# Patient Record
Sex: Female | Born: 1960
Health system: Southern US, Community
[De-identification: ages and names within clinical notes are randomized; demographics above are authoritative.]

## PROBLEM LIST (undated history)

## (undated) DIAGNOSIS — N183 Chronic kidney disease, stage 3 unspecified: Secondary | ICD-10-CM

## (undated) DIAGNOSIS — E119 Type 2 diabetes mellitus without complications: Secondary | ICD-10-CM

## (undated) DIAGNOSIS — J449 Chronic obstructive pulmonary disease, unspecified: Secondary | ICD-10-CM

## (undated) DIAGNOSIS — E785 Hyperlipidemia, unspecified: Secondary | ICD-10-CM

## (undated) DIAGNOSIS — R0600 Dyspnea, unspecified: Secondary | ICD-10-CM

## (undated) DIAGNOSIS — F32A Depression, unspecified: Secondary | ICD-10-CM

## (undated) DIAGNOSIS — R32 Unspecified urinary incontinence: Secondary | ICD-10-CM

## (undated) DIAGNOSIS — I10 Essential (primary) hypertension: Secondary | ICD-10-CM

## (undated) DIAGNOSIS — G4733 Obstructive sleep apnea (adult) (pediatric): Secondary | ICD-10-CM

## (undated) DIAGNOSIS — R296 Repeated falls: Secondary | ICD-10-CM

## (undated) DIAGNOSIS — Z91199 Patient's noncompliance with other medical treatment and regimen due to unspecified reason: Secondary | ICD-10-CM

## (undated) DIAGNOSIS — Z9119 Patient's noncompliance with other medical treatment and regimen: Secondary | ICD-10-CM

## (undated) DIAGNOSIS — M1990 Unspecified osteoarthritis, unspecified site: Secondary | ICD-10-CM

## (undated) DIAGNOSIS — I2510 Atherosclerotic heart disease of native coronary artery without angina pectoris: Secondary | ICD-10-CM

## (undated) DIAGNOSIS — R519 Headache, unspecified: Secondary | ICD-10-CM

## (undated) DIAGNOSIS — K219 Gastro-esophageal reflux disease without esophagitis: Secondary | ICD-10-CM

## (undated) DIAGNOSIS — R51 Headache: Secondary | ICD-10-CM

## (undated) HISTORY — DX: Obstructive sleep apnea (adult) (pediatric): G47.33

## (undated) HISTORY — DX: Type 2 diabetes mellitus without complications: E11.9

## (undated) HISTORY — DX: Patient's noncompliance with other medical treatment and regimen due to unspecified reason: Z91.199

## (undated) HISTORY — PX: TUBAL LIGATION: SHX77

## (undated) HISTORY — PX: BACK SURGERY: SHX140

## (undated) HISTORY — PX: CHOLECYSTECTOMY: SHX55

## (undated) HISTORY — PX: OTHER SURGICAL HISTORY: SHX169

## (undated) HISTORY — DX: Atherosclerotic heart disease of native coronary artery without angina pectoris: I25.10

## (undated) HISTORY — PX: BREAST SURGERY: SHX581

## (undated) HISTORY — DX: Chronic kidney disease, stage 3 (moderate): N18.3

## (undated) HISTORY — DX: Hyperlipidemia, unspecified: E78.5

## (undated) HISTORY — DX: Gastro-esophageal reflux disease without esophagitis: K21.9

## (undated) HISTORY — DX: Chronic kidney disease, stage 3 unspecified: N18.30

## (undated) HISTORY — DX: Morbid (severe) obesity due to excess calories: E66.01

## (undated) HISTORY — DX: Headache, unspecified: R51.9

## (undated) HISTORY — PX: HERNIA REPAIR: SHX51

## (undated) HISTORY — PX: ABDOMINAL HYSTERECTOMY: SHX81

## (undated) HISTORY — DX: Patient's noncompliance with other medical treatment and regimen: Z91.19

## (undated) HISTORY — DX: Headache: R51

## (undated) HISTORY — PX: ABDOMINAL HERNIA REPAIR: SHX539

## (undated) HISTORY — DX: Essential (primary) hypertension: I10

---

## 2002-03-29 ENCOUNTER — Ambulatory Visit (HOSPITAL_COMMUNITY): Admission: RE | Admit: 2002-03-29 | Discharge: 2002-03-29 | Payer: Self-pay | Admitting: *Deleted

## 2002-03-29 ENCOUNTER — Encounter: Payer: Self-pay | Admitting: *Deleted

## 2005-02-01 ENCOUNTER — Ambulatory Visit: Payer: Self-pay | Admitting: Cardiology

## 2005-02-02 ENCOUNTER — Ambulatory Visit: Payer: Self-pay | Admitting: Cardiology

## 2005-02-02 ENCOUNTER — Inpatient Hospital Stay (HOSPITAL_COMMUNITY): Admission: EM | Admit: 2005-02-02 | Discharge: 2005-02-05 | Payer: Self-pay | Admitting: Internal Medicine

## 2005-03-30 ENCOUNTER — Ambulatory Visit: Payer: Self-pay | Admitting: Cardiology

## 2005-03-30 ENCOUNTER — Inpatient Hospital Stay (HOSPITAL_COMMUNITY): Admission: EM | Admit: 2005-03-30 | Discharge: 2005-04-01 | Payer: Self-pay | Admitting: Emergency Medicine

## 2005-03-31 ENCOUNTER — Ambulatory Visit: Payer: Self-pay | Admitting: Cardiology

## 2005-05-26 ENCOUNTER — Ambulatory Visit: Payer: Self-pay | Admitting: Cardiology

## 2005-06-09 ENCOUNTER — Ambulatory Visit: Payer: Self-pay | Admitting: Cardiology

## 2005-07-15 ENCOUNTER — Ambulatory Visit: Payer: Self-pay | Admitting: Cardiology

## 2006-01-31 ENCOUNTER — Ambulatory Visit: Payer: Self-pay | Admitting: Cardiology

## 2006-02-04 ENCOUNTER — Ambulatory Visit: Payer: Self-pay | Admitting: Cardiology

## 2006-02-08 ENCOUNTER — Inpatient Hospital Stay (HOSPITAL_BASED_OUTPATIENT_CLINIC_OR_DEPARTMENT_OTHER): Admission: RE | Admit: 2006-02-08 | Discharge: 2006-02-08 | Payer: Self-pay | Admitting: Cardiology

## 2006-02-08 ENCOUNTER — Ambulatory Visit (HOSPITAL_COMMUNITY): Admission: RE | Admit: 2006-02-08 | Discharge: 2006-02-09 | Payer: Self-pay | Admitting: Cardiology

## 2006-02-08 ENCOUNTER — Ambulatory Visit: Payer: Self-pay | Admitting: Cardiology

## 2006-02-22 ENCOUNTER — Ambulatory Visit: Payer: Self-pay | Admitting: Cardiology

## 2006-05-19 ENCOUNTER — Ambulatory Visit: Payer: Self-pay | Admitting: Cardiology

## 2006-05-26 ENCOUNTER — Ambulatory Visit: Payer: Self-pay | Admitting: Cardiology

## 2006-06-06 ENCOUNTER — Ambulatory Visit: Payer: Self-pay | Admitting: Cardiology

## 2006-06-14 ENCOUNTER — Ambulatory Visit: Payer: Self-pay | Admitting: Cardiology

## 2006-06-28 ENCOUNTER — Ambulatory Visit: Payer: Self-pay | Admitting: Cardiology

## 2006-07-12 ENCOUNTER — Ambulatory Visit: Payer: Self-pay | Admitting: Cardiology

## 2006-07-21 ENCOUNTER — Ambulatory Visit: Payer: Self-pay | Admitting: Cardiology

## 2006-07-26 ENCOUNTER — Ambulatory Visit: Payer: Self-pay | Admitting: Cardiology

## 2006-08-08 ENCOUNTER — Ambulatory Visit: Payer: Self-pay | Admitting: Cardiology

## 2006-08-17 ENCOUNTER — Ambulatory Visit: Payer: Self-pay | Admitting: Cardiology

## 2006-10-04 ENCOUNTER — Ambulatory Visit: Payer: Self-pay | Admitting: Cardiology

## 2007-03-17 ENCOUNTER — Ambulatory Visit: Payer: Self-pay | Admitting: Cardiology

## 2007-05-18 ENCOUNTER — Ambulatory Visit: Payer: Self-pay | Admitting: Cardiology

## 2007-07-24 ENCOUNTER — Ambulatory Visit: Payer: Self-pay | Admitting: Cardiovascular Disease

## 2007-07-24 ENCOUNTER — Inpatient Hospital Stay (HOSPITAL_COMMUNITY): Admission: EM | Admit: 2007-07-24 | Discharge: 2007-07-26 | Payer: Self-pay | Admitting: Emergency Medicine

## 2007-07-24 ENCOUNTER — Encounter: Payer: Self-pay | Admitting: Cardiology

## 2007-08-04 ENCOUNTER — Emergency Department (HOSPITAL_COMMUNITY): Admission: EM | Admit: 2007-08-04 | Discharge: 2007-08-04 | Payer: Self-pay | Admitting: Emergency Medicine

## 2007-08-18 ENCOUNTER — Ambulatory Visit: Payer: Self-pay | Admitting: Cardiology

## 2007-09-11 ENCOUNTER — Ambulatory Visit: Payer: Self-pay | Admitting: Cardiology

## 2008-01-22 ENCOUNTER — Ambulatory Visit: Payer: Self-pay | Admitting: Cardiology

## 2008-02-13 ENCOUNTER — Encounter: Payer: Self-pay | Admitting: Cardiology

## 2008-08-05 ENCOUNTER — Ambulatory Visit: Payer: Self-pay | Admitting: Cardiology

## 2009-01-11 ENCOUNTER — Encounter: Payer: Self-pay | Admitting: Cardiology

## 2009-01-11 ENCOUNTER — Ambulatory Visit: Payer: Self-pay | Admitting: Cardiology

## 2009-01-12 ENCOUNTER — Encounter: Payer: Self-pay | Admitting: Cardiology

## 2009-01-15 ENCOUNTER — Inpatient Hospital Stay (HOSPITAL_COMMUNITY): Admission: AD | Admit: 2009-01-15 | Discharge: 2009-01-16 | Payer: Self-pay | Admitting: Cardiology

## 2009-01-15 ENCOUNTER — Ambulatory Visit: Payer: Self-pay | Admitting: Cardiovascular Disease

## 2009-01-15 ENCOUNTER — Encounter: Payer: Self-pay | Admitting: Cardiology

## 2009-02-04 ENCOUNTER — Ambulatory Visit: Payer: Self-pay | Admitting: Cardiology

## 2009-08-16 ENCOUNTER — Inpatient Hospital Stay (HOSPITAL_COMMUNITY): Admission: EM | Admit: 2009-08-16 | Discharge: 2009-08-18 | Payer: Self-pay | Admitting: Emergency Medicine

## 2009-08-16 ENCOUNTER — Ambulatory Visit: Payer: Self-pay | Admitting: Cardiology

## 2009-08-20 ENCOUNTER — Encounter: Payer: Self-pay | Admitting: Cardiology

## 2009-09-15 ENCOUNTER — Encounter: Payer: Self-pay | Admitting: Cardiology

## 2009-11-14 ENCOUNTER — Ambulatory Visit: Payer: Self-pay | Admitting: Cardiology

## 2010-02-04 ENCOUNTER — Emergency Department (HOSPITAL_COMMUNITY): Admission: EM | Admit: 2010-02-04 | Discharge: 2010-02-04 | Payer: Self-pay | Admitting: Emergency Medicine

## 2010-03-01 ENCOUNTER — Emergency Department (HOSPITAL_COMMUNITY): Admission: EM | Admit: 2010-03-01 | Discharge: 2010-03-01 | Payer: Self-pay | Admitting: Emergency Medicine

## 2010-05-25 ENCOUNTER — Ambulatory Visit: Payer: Self-pay | Admitting: Cardiology

## 2010-11-08 HISTORY — PX: CORONARY ANGIOPLASTY WITH STENT PLACEMENT: SHX49

## 2010-12-05 ENCOUNTER — Observation Stay (HOSPITAL_COMMUNITY)
Admission: EM | Admit: 2010-12-05 | Discharge: 2010-12-07 | Payer: Self-pay | Source: Home / Self Care | Attending: Internal Medicine | Admitting: Internal Medicine

## 2010-12-05 LAB — DIFFERENTIAL
Basophils Absolute: 0.1 10*3/uL (ref 0.0–0.1)
Basophils Relative: 1 % (ref 0–1)
Eosinophils Absolute: 0.6 10*3/uL (ref 0.0–0.7)
Eosinophils Relative: 8 % — ABNORMAL HIGH (ref 0–5)
Lymphocytes Relative: 36 % (ref 12–46)
Lymphs Abs: 2.8 10*3/uL (ref 0.7–4.0)
Monocytes Absolute: 0.5 10*3/uL (ref 0.1–1.0)
Monocytes Relative: 6 % (ref 3–12)
Neutro Abs: 3.9 10*3/uL (ref 1.7–7.7)
Neutrophils Relative %: 49 % (ref 43–77)

## 2010-12-05 LAB — COMPREHENSIVE METABOLIC PANEL
ALT: 19 U/L (ref 0–35)
AST: 19 U/L (ref 0–37)
Albumin: 3.7 g/dL (ref 3.5–5.2)
Alkaline Phosphatase: 59 U/L (ref 39–117)
BUN: 6 mg/dL (ref 6–23)
CO2: 24 mEq/L (ref 19–32)
Calcium: 9 mg/dL (ref 8.4–10.5)
Chloride: 107 mEq/L (ref 96–112)
Creatinine, Ser: 0.85 mg/dL (ref 0.4–1.2)
GFR calc Af Amer: >60 mL/min (ref >60)
GFR calc non Af Amer: >60 mL/min (ref >60)
Glucose, Bld: 102 mg/dL — ABNORMAL HIGH (ref 70–99)
Potassium: 3.7 mEq/L (ref 3.5–5.1)
Sodium: 142 mEq/L (ref 135–145)
Total Bilirubin: 0.3 mg/dL (ref 0.3–1.2)
Total Protein: 6 g/dL (ref 6.0–8.3)

## 2010-12-05 LAB — POCT CARDIAC MARKERS
CKMB, poc: <1 ng/mL — ABNORMAL LOW (ref 1.0–8.0)
Myoglobin, poc: 53.3 ng/mL (ref 12–200)
Troponin i, poc: <0.05 ng/mL (ref 0.00–0.09)

## 2010-12-05 LAB — CBC
HCT: 37 % (ref 36.0–46.0)
Hemoglobin: 12.2 g/dL (ref 12.0–15.0)
MCH: 32 pg (ref 26.0–34.0)
MCHC: 33 g/dL (ref 30.0–36.0)
MCV: 97.1 fL (ref 78.0–100.0)
Platelets: 123 10*3/uL — ABNORMAL LOW (ref 150–400)
RBC: 3.81 MIL/uL — ABNORMAL LOW (ref 3.87–5.11)
RDW: 14.3 % (ref 11.5–15.5)
WBC: 7.9 10*3/uL (ref 4.0–10.5)

## 2010-12-06 LAB — CBC
HCT: 34.2 % — ABNORMAL LOW (ref 36.0–46.0)
Hemoglobin: 11.3 g/dL — ABNORMAL LOW (ref 12.0–15.0)
MCH: 32.2 pg (ref 26.0–34.0)
MCHC: 33 g/dL (ref 30.0–36.0)
MCV: 97.4 fL (ref 78.0–100.0)
Platelets: 110 10*3/uL — ABNORMAL LOW (ref 150–400)
RBC: 3.51 MIL/uL — ABNORMAL LOW (ref 3.87–5.11)
RDW: 14.5 % (ref 11.5–15.5)
WBC: 6.4 10*3/uL (ref 4.0–10.5)

## 2010-12-06 LAB — URINALYSIS, ROUTINE W REFLEX MICROSCOPIC
Bilirubin Urine: NEGATIVE
Hgb urine dipstick: NEGATIVE
Ketones, ur: NEGATIVE mg/dL
Nitrite: NEGATIVE
Protein, ur: NEGATIVE mg/dL
Specific Gravity, Urine: 1.007 (ref 1.005–1.030)
Urine Glucose, Fasting: NEGATIVE mg/dL
Urobilinogen, UA: 0.2 mg/dL (ref 0.0–1.0)
pH: 6 (ref 5.0–8.0)

## 2010-12-06 LAB — CARDIAC PANEL(CRET KIN+CKTOT+MB+TROPI)
CK, MB: 0.8 ng/mL (ref 0.3–4.0)
Relative Index: INVALID (ref 0.0–2.5)
Total CK: 45 U/L (ref 7–177)
Troponin I: <0.01 ng/mL (ref 0.00–0.06)

## 2010-12-06 LAB — BASIC METABOLIC PANEL
BUN: 5 mg/dL — ABNORMAL LOW (ref 6–23)
CO2: 27 mEq/L (ref 19–32)
Calcium: 8.6 mg/dL (ref 8.4–10.5)
Chloride: 108 mEq/L (ref 96–112)
Creatinine, Ser: 0.86 mg/dL (ref 0.4–1.2)
GFR calc Af Amer: >60 mL/min (ref >60)
GFR calc non Af Amer: >60 mL/min (ref >60)
Glucose, Bld: 86 mg/dL (ref 70–99)
Potassium: 3.7 mEq/L (ref 3.5–5.1)
Sodium: 142 mEq/L (ref 135–145)

## 2010-12-06 LAB — POCT CARDIAC MARKERS
CKMB, poc: <1 ng/mL — ABNORMAL LOW (ref 1.0–8.0)
Myoglobin, poc: 42.6 ng/mL (ref 12–200)
Troponin i, poc: <0.05 ng/mL (ref 0.00–0.09)

## 2010-12-06 LAB — LIPID PANEL
Cholesterol: 171 mg/dL (ref 0–200)
HDL: 40 mg/dL (ref >39)
LDL Cholesterol: 116 mg/dL — ABNORMAL HIGH (ref 0–99)
Total CHOL/HDL Ratio: 4.3 RATIO
Triglycerides: 77 mg/dL (ref <150)
VLDL: 15 mg/dL (ref 0–40)

## 2010-12-06 LAB — MRSA PCR SCREENING: MRSA by PCR: NEGATIVE

## 2010-12-06 LAB — CK TOTAL AND CKMB (NOT AT ARMC)
CK, MB: 0.9 ng/mL (ref 0.3–4.0)
Relative Index: INVALID (ref 0.0–2.5)
Total CK: 48 U/L (ref 7–177)

## 2010-12-06 LAB — TSH: TSH: 3.824 u[IU]/mL (ref 0.350–4.500)

## 2010-12-06 LAB — TROPONIN I: Troponin I: <0.01 ng/mL (ref 0.00–0.06)

## 2010-12-07 LAB — CBC
HCT: 34.3 % — ABNORMAL LOW (ref 36.0–46.0)
Hemoglobin: 11 g/dL — ABNORMAL LOW (ref 12.0–15.0)
MCH: 31.5 pg (ref 26.0–34.0)
MCHC: 32.1 g/dL (ref 30.0–36.0)
MCV: 98.3 fL (ref 78.0–100.0)
Platelets: 109 10*3/uL — ABNORMAL LOW (ref 150–400)
RBC: 3.49 MIL/uL — ABNORMAL LOW (ref 3.87–5.11)
RDW: 14.6 % (ref 11.5–15.5)
WBC: 5.4 10*3/uL (ref 4.0–10.5)

## 2010-12-07 LAB — LIPASE, BLOOD: Lipase: 29 U/L (ref 11–59)

## 2010-12-07 LAB — H. PYLORI ANTIBODY, IGG: H Pylori IgG: 0.6 {ISR}

## 2010-12-10 NOTE — Medication Information (Signed)
Summary: RX Folder/ ZANTAC TAB  RX Folder/ ZANTAC TAB   Imported By: Dorise Hiss 09/16/2009 10:26:37  _____________________________________________________________________  External Attachment:    Type:   Image     Comment:   External Document

## 2010-12-10 NOTE — Medication Information (Signed)
Summary: Wellsite geologist WITH ZANTAC  RX Folder/ ASSISTANCE WITH ZANTAC   Imported By: Dorise Hiss 09/01/2009 16:29:37  _____________________________________________________________________  External Attachment:    Type:   Image     Comment:   External Document

## 2010-12-20 NOTE — Discharge Summary (Signed)
NAME:  [name], [place] NO.:  [id number]  MEDICAL RECORD NO.:  [id number]          PATIENT TYPE:  OBV  LOCATION:  2034                         FACILITY:  [facility]  PHYSICIAN:  Brendia Sacks, MD    DATE OF BIRTH:  1961-05-01  DATE OF ADMISSION:  12/05/2010 DATE OF DISCHARGE:  12/07/2010                              DISCHARGE SUMMARY   PRIMARY CARE PHYSICIAN:  The patient has none.  PRIMARY CARDIOLOGIST:  Learta Codding, MD, [facility]  CONDITION ON DISCHARGE:  Improved.  DISCHARGE DIAGNOSES: 1. Noncardiac chest pain. 2. Gastroesophageal reflux disease, uncontrolled. 3. Hyperlipidemia. 4. History of coronary artery disease.  HISTORY OF PRESENT ILLNESS:  This is a 50 year old woman with a history of coronary artery disease with previous history of stent placement and subsequent cardiac catheterization, demonstrating nonobstructing disease in 2010, with a widely patent stent and normal left ventricular function, who presented to the emergency room with chest pain.  Her pain resolved with treatment and she was admitted for further evaluation. Apparently, this case was discussed with [facility] with consultation was not pursued.  HOSPITAL COURSE: 1. Noncardiac chest pain.  The patient was admitted and she did rule     out with serial cardiac enzymes.  Her pain is completely resolved.     EKGs unremarkable.  Cardiac enzymes are negative.  She would like     to go home today and requests discharge.  The patient has been     followed by Dr. Andee Lineman in the past with a history of noncompliance.     Her symptoms began yesterday and she describes them as burning     sensation while she was preparing some lunch for her grandchild.     She often gets chest pain, takes many Tums daily for GERD.  All her     symptoms seem to be chronic in nature and most likely GI,     especially knowing her history of unremarkable cardiac     catheterization in 2010 and longstanding  daily symptoms.  She would     like to go home today which I think is reasonable.  I had left a     message for Dr. Andee Lineman to discuss her case with him, but I do not     think the patient warrants further hospitalization at this point.     Consider outpatient stress testing, but would defer this to Dr.     Andee Lineman. 2. Hyperlipidemia.  I have started her on Pravachol which she can     obtain at [facility] for 4 dollars a month.  She is agreeable to     taking this. 3. GERD.  I have recommended Pepcid. 4. Noncompliance.  The patient tells me that she really does not have     the time or interest to establish with a primary care physician.     She, however, is willing to follow up with Dr. Andee Lineman.  Overall,     she does not seem motivated to see care for herself.  She states     she does not have a time  because she has to take care of her     husband.  CONSULTATIONS:  None.  PROCEDURES:  None.  IMAGING:  Chest x-ray on December 05, 2010:  Cardiomegaly and hyperexpansion without acute findings.  MICROBIOLOGY:  None.  PERTINENT LABORATORY STUDIES: 1. Total cholesterol 171, LDL 116. 2. TSH within normal limits. 3. Lipase within normal limits. 4. CBC notable for a hemoglobin of 11.3 which is stable and platelet     count 109 which is stable.  She has thrombocytopenia of unclear     etiology, seen back in 2006. 5. Basic metabolic panel unremarkable. 6. Hepatic function panel unremarkable. 7. Cardiac enzymes negative. 8. TSH within normal limits.  ANCILLARY STUDIES:  EKG showed normal sinus rhythm.  No acute changes. Repeat EKG sinus bradycardia with no acute changes.  PHYSICAL EXAMINATION:  GENERAL:  On discharge, the patient feels well. No pain. VITAL SIGNS:  Temperature 97.1, pulse 60, respirations 18, blood pressure 123/68, satting 95% on room air. CARDIOVASCULAR:  Regular rate and rhythm.  No murmur, rub, or gallop. RESPIRATORY:  Clear to auscultation bilaterally.  No wheezes,  rales, or rhonchi.  Normal respiratory effort.  DISCHARGE INSTRUCTIONS:  The patient will be discharged home today.  DIET:  Heart healthy.  ACTIVITY:  As tolerated.  She should follow up with Dr. Andee Lineman in approximately 1 week.  DISCHARGE MEDICATIONS: 1. Nitroglycerin sublingual 0.4 mg p.r.n. chest pain, may use q.5     minutes x2; if no relief, call 911. 2. Pepcid 20 mg p.o. b.i.d. 3. Pravachol 20 mg p.o. daily. 4. Aspirin 325 mg p.o. daily.  TIME COORDINATING DISCHARGE:  35 minutes.  I discussed the case with Dr. Andee Lineman and he will arrange for outpatient follow-up in his office.     Brendia Sacks, MD     DG/MEDQ  D:  12/07/2010  T:  12/08/2010  Job:  [id number]  cc:   Learta Codding, MD,FACC  Electronically Signed by Brendia Sacks  on 12/20/2010 04:08:05 PM

## 2011-01-11 ENCOUNTER — Encounter: Payer: Self-pay | Admitting: *Deleted

## 2011-01-11 NOTE — H&P (Signed)
NAME:  [name], [name] NO.:  [id number]  MEDICAL RECORD NO.:  [id number]          PATIENT TYPE:  OBV  LOCATION:  2034                         FACILITY:  [facility]  PHYSICIAN:  Della Goo, M.D. DATE OF BIRTH:  10/14/1961  DATE OF ADMISSION:  12/05/2010 DATE OF DISCHARGE:                             HISTORY & PHYSICAL   PRIMARY CARE PHYSICIAN:  Unassigned.  CARDIOLOGIST:  [facility].  CHIEF COMPLAINT:  Chest pain.  HISTORY OF PRESENT ILLNESS:  This is a 50 year old female with a history of coronary artery disease, status post PTCA with stent placement times one in 2008 who presents to the emergency department secondary to complaints of severe substernal area chest pain and shortness of breath. The patient reports having chest pain off and on but reports that her chest pain usually is relieved after taking an aspirin at home.  The patient states that the pain prior to arrival was so severe 10/10 but did not resolve after taking aspirin or two sublingual nitroglycerin tablets.  So she called EMS in [facility] and was brought to [facility] for evaluation.  The patient reported having shortness of breath, nausea, and no vomiting.  The pain was radiating up into her neck area.  She denied having any diaphoresis.  The pain occurred at rest.  The patient was seen and evaluated in the emergency department.  Point of care cardiac markers were performed which were found to be negative x2 and an EKG was also performed which was negative for any acute findings.  The patient was referred for a cardiac rule out.  The [facility] on-call consultant was notified and her case was discussed.  PAST MEDICAL HISTORY:  Significant for coronary artery disease, status post PTCA with stent placement, moribd obesity, medical noncompliance.  PAST SURGICAL HISTORY:  History of a cholecystectomy, hysterectomy, and PTCA with stent  placement.  MEDICATIONS:  At this time include aspirin.  ALLERGIES:  Intolerance to MORPHINE causing nausea.  SOCIAL HISTORY:  Patient is a smoker.  She denies any alcohol usage or illicit drug usage.  FAMILY HISTORY:  Positive for coronary artery disease in her father.  REVIEW OF SYSTEMS:  Pertinent as mentioned above.  PHYSICAL EXAMINATION FINDINGS:  GENERAL:  This is an obese 50 year old Caucasian female in no visible discomfort or acute distress currently. VITAL SIGNS:  Temperature 97.7, blood pressure 123/62, heart rate 69, respirations 18, O2 saturations, 97-100%. HEENT:  Normocephalic and atraumatic.  Pupils equally round reactive to light.  Extraocular movements are intact.  Funduscopic benign.  Nares are patent bilaterally.  Oropharynx is clear. NECK:  Supple.  Full range of motion.  No thyromegaly, adenopathy, jugular venous distention. CARDIOVASCULAR:  Regular rate and rhythm.  No murmurs, gallops, or rubs appreciated. LUNGS:  Clear to auscultation bilaterally.  No rales, rhonchi, or wheezes. ABDOMEN:  Positive bowel sounds, obese, soft, nontender, and nondistended.  No hepatosplenomegaly.  EXTREMITIES:  Without cyanosis, clubbing, or edema. NEUROLOGICAL:  Nonfocal.  LABORATORY STUDIES:  White blood cell count 7.9, hemoglobin 12.2, hematocrit 37.0, MCV 97.1, platelets 123, and neutrophils 49%, lymphocytes 36%.  Sodium 142,  potassium 3.7, chloride 107, carbon dioxide 24, BUN 6, creatinine 0.85, and glucose 102.  Urinalysis negative.  Point-of-care cardiac markers with a myoglobin of 53.3, CK-MB less than 1.0, troponin less than 0.05.  Second set of point-of-care markers myoglobin 42.6, CK-MB less than 1.0, troponin less than 0.05. Chest x-ray reveals cardiomegaly with hyperexpansion but no acute disease findings seen.  EKG reveals a normal sinus rhythm and incomplete right bundle-branch block is seen and a left posterior fascicular block is seen rate is 67,  otherwise nonspecific ST-segment changes were seen. Unchanged from previous EKG from August 17, 2009.  ASSESSMENT:  A 50 year old female being admitted with 1. Chest pain. 2. Shortness of breath. 3. Coronary artery disease. 4. Obesity.  PLAN:  The patient will be admitted for 23-hour observation.  Cardiac enzymes will be performed, and the patient will be placed on Nitro paste, aspirin, and oxygen therapy as well as DVT prophylaxis at this time.  Further workup will ensue pending results and the patient's clinical course.  Cardiology will be notified if the patient began to show cardiac changes or clinical changes.     Della Goo, M.D.     HJ/MEDQ  D:  12/06/2010  T:  12/06/2010  Job:  [id number]  Electronically Signed by Della Goo M.D. on 01/11/2011 07:52:23 PM

## 2011-01-26 ENCOUNTER — Emergency Department (HOSPITAL_COMMUNITY)
Admission: EM | Admit: 2011-01-26 | Discharge: 2011-01-26 | Disposition: A | Discharge status: Home / Self Care | Payer: Self-pay | Attending: Emergency Medicine | Admitting: Emergency Medicine

## 2011-01-26 DIAGNOSIS — IMO0002 Reserved for concepts with insufficient information to code with codable children: Secondary | ICD-10-CM | POA: Insufficient documentation

## 2011-01-26 DIAGNOSIS — M545 Low back pain, unspecified: Secondary | ICD-10-CM | POA: Insufficient documentation

## 2011-01-26 DIAGNOSIS — IMO0001 Reserved for inherently not codable concepts without codable children: Secondary | ICD-10-CM | POA: Insufficient documentation

## 2011-01-27 ENCOUNTER — Encounter: Payer: Self-pay | Admitting: Cardiology

## 2011-02-02 ENCOUNTER — Encounter: Payer: Self-pay | Admitting: *Deleted

## 2011-02-03 ENCOUNTER — Encounter: Payer: Self-pay | Admitting: Cardiology

## 2011-02-03 DIAGNOSIS — F411 Generalized anxiety disorder: Secondary | ICD-10-CM | POA: Insufficient documentation

## 2011-02-03 DIAGNOSIS — E785 Hyperlipidemia, unspecified: Secondary | ICD-10-CM | POA: Insufficient documentation

## 2011-02-03 DIAGNOSIS — F17210 Nicotine dependence, cigarettes, uncomplicated: Secondary | ICD-10-CM | POA: Insufficient documentation

## 2011-02-03 DIAGNOSIS — G4700 Insomnia, unspecified: Secondary | ICD-10-CM | POA: Insufficient documentation

## 2011-02-03 DIAGNOSIS — Z720 Tobacco use: Secondary | ICD-10-CM | POA: Insufficient documentation

## 2011-02-03 DIAGNOSIS — E1169 Type 2 diabetes mellitus with other specified complication: Secondary | ICD-10-CM

## 2011-02-03 DIAGNOSIS — Z9119 Patient's noncompliance with other medical treatment and regimen: Secondary | ICD-10-CM | POA: Insufficient documentation

## 2011-02-03 DIAGNOSIS — I2510 Atherosclerotic heart disease of native coronary artery without angina pectoris: Secondary | ICD-10-CM | POA: Insufficient documentation

## 2011-02-03 DIAGNOSIS — K219 Gastro-esophageal reflux disease without esophagitis: Secondary | ICD-10-CM | POA: Insufficient documentation

## 2011-02-03 DIAGNOSIS — F17200 Nicotine dependence, unspecified, uncomplicated: Secondary | ICD-10-CM

## 2011-02-03 HISTORY — DX: Type 2 diabetes mellitus with other specified complication: E11.69

## 2011-02-11 LAB — DIFFERENTIAL
Basophils Absolute: 0.1 10*3/uL (ref 0.0–0.1)
Basophils Relative: 2 % — ABNORMAL HIGH (ref 0–1)
Eosinophils Absolute: 0.7 10*3/uL (ref 0.0–0.7)
Eosinophils Relative: 8 % — ABNORMAL HIGH (ref 0–5)
Lymphocytes Relative: 31 % (ref 12–46)
Lymphs Abs: 2.6 10*3/uL (ref 0.7–4.0)
Monocytes Absolute: 0.5 10*3/uL (ref 0.1–1.0)
Monocytes Relative: 6 % (ref 3–12)
Neutro Abs: 4.5 10*3/uL (ref 1.7–7.7)
Neutrophils Relative %: 53 % (ref 43–77)

## 2011-02-11 LAB — CBC
HCT: 32.1 % — ABNORMAL LOW (ref 36.0–46.0)
HCT: 32.8 % — ABNORMAL LOW (ref 36.0–46.0)
HCT: 36.5 % (ref 36.0–46.0)
Hemoglobin: 11 g/dL — ABNORMAL LOW (ref 12.0–15.0)
Hemoglobin: 11.2 g/dL — ABNORMAL LOW (ref 12.0–15.0)
Hemoglobin: 12.5 g/dL (ref 12.0–15.0)
MCHC: 34.2 g/dL (ref 30.0–36.0)
MCHC: 34.2 g/dL (ref 30.0–36.0)
MCHC: 34.3 g/dL (ref 30.0–36.0)
MCV: 96.9 fL (ref 78.0–100.0)
MCV: 97.4 fL (ref 78.0–100.0)
MCV: 97.5 fL (ref 78.0–100.0)
Platelets: 117 10*3/uL — ABNORMAL LOW (ref 150–400)
Platelets: 80 10*3/uL — ABNORMAL LOW (ref 150–400)
Platelets: 99 10*3/uL — ABNORMAL LOW (ref 150–400)
RBC: 3.29 MIL/uL — ABNORMAL LOW (ref 3.87–5.11)
RBC: 3.37 MIL/uL — ABNORMAL LOW (ref 3.87–5.11)
RBC: 3.77 MIL/uL — ABNORMAL LOW (ref 3.87–5.11)
RDW: 14 % (ref 11.5–15.5)
RDW: 14.5 % (ref 11.5–15.5)
RDW: 14.7 % (ref 11.5–15.5)
WBC: 6.4 10*3/uL (ref 4.0–10.5)
WBC: 6.6 10*3/uL (ref 4.0–10.5)
WBC: 8.4 10*3/uL (ref 4.0–10.5)

## 2011-02-11 LAB — HEPATIC FUNCTION PANEL
ALT: <8 U/L (ref 0–35)
AST: 12 U/L (ref 0–37)
Albumin: 3.2 g/dL — ABNORMAL LOW (ref 3.5–5.2)
Alkaline Phosphatase: 45 U/L (ref 39–117)
Bilirubin, Direct: 0.1 mg/dL (ref 0.0–0.3)
Indirect Bilirubin: 0.3 mg/dL (ref 0.3–0.9)
Total Bilirubin: 0.4 mg/dL (ref 0.3–1.2)
Total Protein: 5.5 g/dL — ABNORMAL LOW (ref 6.0–8.3)

## 2011-02-11 LAB — LIPID PANEL
Cholesterol: 134 mg/dL (ref 0–200)
HDL: 38 mg/dL — ABNORMAL LOW (ref >39)
LDL Cholesterol: 80 mg/dL (ref 0–99)
Total CHOL/HDL Ratio: 3.5 RATIO
Triglycerides: 78 mg/dL (ref <150)
VLDL: 16 mg/dL (ref 0–40)

## 2011-02-11 LAB — RAPID URINE DRUG SCREEN, HOSP PERFORMED
Amphetamines: NOT DETECTED
Barbiturates: NOT DETECTED
Benzodiazepines: NOT DETECTED
Cocaine: NOT DETECTED
Opiates: NOT DETECTED
Tetrahydrocannabinol: NOT DETECTED

## 2011-02-11 LAB — GLUCOSE, CAPILLARY
Glucose-Capillary: 113 mg/dL — ABNORMAL HIGH (ref 70–99)
Glucose-Capillary: 124 mg/dL — ABNORMAL HIGH (ref 70–99)
Glucose-Capillary: 92 mg/dL (ref 70–99)
Glucose-Capillary: 97 mg/dL (ref 70–99)

## 2011-02-11 LAB — CARDIAC PANEL(CRET KIN+CKTOT+MB+TROPI)
CK, MB: 0.6 ng/mL (ref 0.3–4.0)
CK, MB: 0.8 ng/mL (ref 0.3–4.0)
CK, MB: 1 ng/mL (ref 0.3–4.0)
CK, MB: 1.1 ng/mL (ref 0.3–4.0)
CK, MB: 1.3 ng/mL (ref 0.3–4.0)
CK, MB: 1.6 ng/mL (ref 0.3–4.0)
Relative Index: INVALID (ref 0.0–2.5)
Relative Index: INVALID (ref 0.0–2.5)
Relative Index: INVALID (ref 0.0–2.5)
Relative Index: INVALID (ref 0.0–2.5)
Relative Index: INVALID (ref 0.0–2.5)
Relative Index: INVALID (ref 0.0–2.5)
Total CK: 37 U/L (ref 7–177)
Total CK: 38 U/L (ref 7–177)
Total CK: 39 U/L (ref 7–177)
Total CK: 41 U/L (ref 7–177)
Total CK: 43 U/L (ref 7–177)
Total CK: 52 U/L (ref 7–177)
Troponin I: 0.01 ng/mL (ref 0.00–0.06)
Troponin I: 0.01 ng/mL (ref 0.00–0.06)
Troponin I: 0.01 ng/mL (ref 0.00–0.06)
Troponin I: 0.02 ng/mL (ref 0.00–0.06)
Troponin I: 0.02 ng/mL (ref 0.00–0.06)
Troponin I: 0.03 ng/mL (ref 0.00–0.06)

## 2011-02-11 LAB — COMPREHENSIVE METABOLIC PANEL
ALT: 12 U/L (ref 0–35)
AST: 23 U/L (ref 0–37)
Albumin: 3.7 g/dL (ref 3.5–5.2)
Alkaline Phosphatase: 52 U/L (ref 39–117)
BUN: 9 mg/dL (ref 6–23)
CO2: 23 mEq/L (ref 19–32)
Calcium: 8.8 mg/dL (ref 8.4–10.5)
Chloride: 109 mEq/L (ref 96–112)
Creatinine, Ser: 0.77 mg/dL (ref 0.4–1.2)
GFR calc Af Amer: >60 mL/min (ref >60)
GFR calc non Af Amer: >60 mL/min (ref >60)
Glucose, Bld: 108 mg/dL — ABNORMAL HIGH (ref 70–99)
Potassium: 4.1 mEq/L (ref 3.5–5.1)
Sodium: 139 mEq/L (ref 135–145)
Total Bilirubin: 0.9 mg/dL (ref 0.3–1.2)
Total Protein: 5.9 g/dL — ABNORMAL LOW (ref 6.0–8.3)

## 2011-02-11 LAB — URINALYSIS, ROUTINE W REFLEX MICROSCOPIC
Bilirubin Urine: NEGATIVE
Glucose, UA: NEGATIVE mg/dL
Hgb urine dipstick: NEGATIVE
Ketones, ur: NEGATIVE mg/dL
Nitrite: NEGATIVE
Protein, ur: NEGATIVE mg/dL
Specific Gravity, Urine: 1.013 (ref 1.005–1.030)
Urobilinogen, UA: 0.2 mg/dL (ref 0.0–1.0)
pH: 6 (ref 5.0–8.0)

## 2011-02-11 LAB — TSH: TSH: 2.114 u[IU]/mL (ref 0.350–4.500)

## 2011-02-11 LAB — HEMOGLOBIN A1C
Hgb A1c MFr Bld: 5.8 % (ref 4.6–6.1)
Mean Plasma Glucose: 120 mg/dL

## 2011-02-11 LAB — HEPARIN LEVEL (UNFRACTIONATED)
Heparin Unfractionated: 0.15 IU/mL — ABNORMAL LOW (ref 0.30–0.70)
Heparin Unfractionated: 0.42 IU/mL (ref 0.30–0.70)

## 2011-02-11 LAB — H. PYLORI ANTIBODY, IGG: H Pylori IgG: 0.6 {ISR}

## 2011-02-11 LAB — TROPONIN I: Troponin I: 0.02 ng/mL (ref 0.00–0.06)

## 2011-02-11 LAB — POCT CARDIAC MARKERS
CKMB, poc: 1.4 ng/mL (ref 1.0–8.0)
Myoglobin, poc: 65.6 ng/mL (ref 12–200)
Troponin i, poc: <0.05 ng/mL (ref 0.00–0.09)

## 2011-02-11 LAB — D-DIMER, QUANTITATIVE: D-Dimer, Quant: 0.32 ug/mL-FEU (ref 0.00–0.48)

## 2011-02-11 LAB — AMYLASE: Amylase: 53 U/L (ref 27–131)

## 2011-02-11 LAB — C-REACTIVE PROTEIN: CRP: 0.3 mg/dL — ABNORMAL LOW (ref <0.6)

## 2011-02-11 LAB — LIPASE, BLOOD: Lipase: 32 U/L (ref 11–59)

## 2011-02-11 LAB — CK TOTAL AND CKMB (NOT AT ARMC)
CK, MB: 1.5 ng/mL (ref 0.3–4.0)
Relative Index: INVALID (ref 0.0–2.5)
Total CK: 87 U/L (ref 7–177)

## 2011-02-11 LAB — SEDIMENTATION RATE: Sed Rate: 12 mm/hr (ref 0–22)

## 2011-02-11 LAB — BRAIN NATRIURETIC PEPTIDE: Pro B Natriuretic peptide (BNP): 69 pg/mL (ref 0.0–100.0)

## 2011-02-18 LAB — DIFFERENTIAL
Basophils Absolute: 0.1 10*3/uL (ref 0.0–0.1)
Basophils Relative: 2 % — ABNORMAL HIGH (ref 0–1)
Eosinophils Absolute: 0.3 10*3/uL (ref 0.0–0.7)
Eosinophils Relative: 6 % — ABNORMAL HIGH (ref 0–5)
Lymphocytes Relative: 39 % (ref 12–46)
Lymphs Abs: 2.1 10*3/uL (ref 0.7–4.0)
Monocytes Absolute: 0.3 10*3/uL (ref 0.1–1.0)
Monocytes Relative: 6 % (ref 3–12)
Neutro Abs: 2.5 10*3/uL (ref 1.7–7.7)
Neutrophils Relative %: 47 % (ref 43–77)

## 2011-02-18 LAB — COMPREHENSIVE METABOLIC PANEL
ALT: 13 U/L (ref 0–35)
AST: 17 U/L (ref 0–37)
Albumin: 3.8 g/dL (ref 3.5–5.2)
Alkaline Phosphatase: 52 U/L (ref 39–117)
BUN: 13 mg/dL (ref 6–23)
CO2: 27 mEq/L (ref 19–32)
Calcium: 9.4 mg/dL (ref 8.4–10.5)
Chloride: 103 mEq/L (ref 96–112)
Creatinine, Ser: 1.06 mg/dL (ref 0.4–1.2)
GFR calc Af Amer: >60 mL/min (ref >60)
GFR calc non Af Amer: 55 mL/min — ABNORMAL LOW (ref >60)
Glucose, Bld: 95 mg/dL (ref 70–99)
Potassium: 4.1 mEq/L (ref 3.5–5.1)
Sodium: 140 mEq/L (ref 135–145)
Total Bilirubin: 0.6 mg/dL (ref 0.3–1.2)
Total Protein: 6.6 g/dL (ref 6.0–8.3)

## 2011-02-18 LAB — BASIC METABOLIC PANEL
BUN: 14 mg/dL (ref 6–23)
CO2: 27 mEq/L (ref 19–32)
Calcium: 9.1 mg/dL (ref 8.4–10.5)
Chloride: 106 mEq/L (ref 96–112)
Creatinine, Ser: 1 mg/dL (ref 0.4–1.2)
GFR calc Af Amer: >60 mL/min (ref >60)
GFR calc non Af Amer: 59 mL/min — ABNORMAL LOW (ref >60)
Glucose, Bld: 85 mg/dL (ref 70–99)
Potassium: 4 mEq/L (ref 3.5–5.1)
Sodium: 139 mEq/L (ref 135–145)

## 2011-02-18 LAB — CARDIAC PANEL(CRET KIN+CKTOT+MB+TROPI)
CK, MB: 0.4 ng/mL (ref 0.3–4.0)
CK, MB: 0.4 ng/mL (ref 0.3–4.0)
CK, MB: 0.5 ng/mL (ref 0.3–4.0)
Relative Index: INVALID (ref 0.0–2.5)
Relative Index: INVALID (ref 0.0–2.5)
Relative Index: INVALID (ref 0.0–2.5)
Total CK: 54 U/L (ref 7–177)
Total CK: 56 U/L (ref 7–177)
Total CK: 73 U/L (ref 7–177)
Troponin I: <0.01 ng/mL (ref 0.00–0.06)
Troponin I: <0.01 ng/mL (ref 0.00–0.06)
Troponin I: <0.01 ng/mL (ref 0.00–0.06)

## 2011-02-18 LAB — LIPID PANEL
Cholesterol: 157 mg/dL (ref 0–200)
HDL: 40 mg/dL (ref >39)
LDL Cholesterol: 97 mg/dL (ref 0–99)
Total CHOL/HDL Ratio: 3.9 RATIO
Triglycerides: 99 mg/dL (ref <150)
VLDL: 20 mg/dL (ref 0–40)

## 2011-02-18 LAB — CBC
HCT: 38.2 % (ref 36.0–46.0)
HCT: 41.6 % (ref 36.0–46.0)
Hemoglobin: 13.5 g/dL (ref 12.0–15.0)
Hemoglobin: 14.4 g/dL (ref 12.0–15.0)
MCHC: 34.7 g/dL (ref 30.0–36.0)
MCHC: 35.4 g/dL (ref 30.0–36.0)
MCV: 93.8 fL (ref 78.0–100.0)
MCV: 95.4 fL (ref 78.0–100.0)
Platelets: 103 10*3/uL — ABNORMAL LOW (ref 150–400)
Platelets: 88 10*3/uL — ABNORMAL LOW (ref 150–400)
RBC: 4.07 MIL/uL (ref 3.87–5.11)
RBC: 4.36 MIL/uL (ref 3.87–5.11)
RDW: 14.1 % (ref 11.5–15.5)
RDW: 14.2 % (ref 11.5–15.5)
WBC: 5.4 10*3/uL (ref 4.0–10.5)
WBC: 5.9 10*3/uL (ref 4.0–10.5)

## 2011-02-18 LAB — PROTIME-INR
INR: 1 (ref 0.00–1.49)
Prothrombin Time: 12.8 seconds (ref 11.6–15.2)

## 2011-02-18 LAB — BRAIN NATRIURETIC PEPTIDE: Pro B Natriuretic peptide (BNP): <30 pg/mL (ref 0.0–100.0)

## 2011-02-18 LAB — MAGNESIUM: Magnesium: 2.5 mg/dL (ref 1.5–2.5)

## 2011-03-04 ENCOUNTER — Ambulatory Visit (INDEPENDENT_AMBULATORY_CARE_PROVIDER_SITE_OTHER): Payer: Self-pay | Admitting: Cardiology

## 2011-03-04 ENCOUNTER — Encounter: Payer: Self-pay | Admitting: Cardiology

## 2011-03-04 VITALS — BP 114/71 | HR 64 | Ht 62.0 in | Wt 292.0 lb

## 2011-03-04 DIAGNOSIS — F419 Anxiety disorder, unspecified: Secondary | ICD-10-CM

## 2011-03-04 DIAGNOSIS — F411 Generalized anxiety disorder: Secondary | ICD-10-CM

## 2011-03-04 DIAGNOSIS — R0602 Shortness of breath: Secondary | ICD-10-CM

## 2011-03-04 DIAGNOSIS — E785 Hyperlipidemia, unspecified: Secondary | ICD-10-CM

## 2011-03-04 DIAGNOSIS — R9430 Abnormal result of cardiovascular function study, unspecified: Secondary | ICD-10-CM

## 2011-03-04 DIAGNOSIS — I2510 Atherosclerotic heart disease of native coronary artery without angina pectoris: Secondary | ICD-10-CM

## 2011-03-04 MED ORDER — DIAZEPAM 10 MG PO TABS: 10.0000 mg | ORAL_TABLET | Freq: Every evening | ORAL | Status: AC | PRN

## 2011-03-04 NOTE — Patient Instructions (Addendum)
   Valium 10mg  - may take one tab every evening as needed for sleep / anxiety  Your physician recommends that you go to the [facility] for lab work.  If the results of your test are normal or stable, you will receive a letter.  If they are abnormal, the nurse will contact you by phone. Your physician wants you to follow up in: 6 months.  You will receive a reminder letter in the mail one-two months in advance.  If you don't receive a letter, please call our office to schedule the follow up appointment

## 2011-03-06 NOTE — Assessment & Plan Note (Signed)
Particularly associated with insomnia. I given the patient prescription for Valium 10 mg by mouth by mouth each bedtime

## 2011-03-06 NOTE — Assessment & Plan Note (Signed)
On statin therapy 

## 2011-03-06 NOTE — Assessment & Plan Note (Signed)
No further ischemia workup required. However the patient does complain of orthopnea PND and we'll schedule her for a BNP level

## 2011-03-06 NOTE — Progress Notes (Signed)
HPI The patient is a 50 year old female recently admitted to [facility] with a prior history of coronary artery disease and stent placement. She had a followup cardiac catheterization in 2010 which showed nonobstructive coronary artery disease with a widely patent stent in normal LV function. She was admitted by the hospitalist service but no cardiology consultation was obtained. The patient ruled out for myocardial infarction. There is a history of noncompliance. There was some thought that maybe her symptoms were related to GERD. She also has history of hyperlipidemia. She also has no primary care physician. The patient has no real cardiac complaints. She denies any chest pain or shortness of breath. She does report symptoms of orthopnea PND but it appears that she more likely has panic attacks during the nighttime. She doesn't sleep very much and wakes up multiple times in the nighttime. She also has chronic pain in her legs and knees. She is overweight in doing a lot of weight on her lower extremities. She however has no significant edema she also has chronic back problems.  Allergies  Allergen Reactions  . Morphine And Related Nausea Only    Makes her sick on the stomach    Current Outpatient Prescriptions on File Prior to Visit  Medication Sig Dispense Refill  . aspirin 325 MG tablet Take 325 mg by mouth daily.          Past Medical History  Diagnosis Date  . GERD (gastroesophageal reflux disease)   . Hyperlipidemia   . Noncompliance   . CAD (coronary artery disease)     post PTCA with stent placement  . Morbid obesity     Past Surgical History  Procedure Date  . Cholecystectomy   . Abdominal hysterectomy   . Ptca     WITH STENT PLACEMENT    Family History  Problem Relation Age of Onset  . Coronary artery disease Father     History   Social History  . Marital Status: Married    Spouse Name: N/A    Number of Children: N/A  . Years of Education: N/A    Occupational History  . Not on file.   Social History Main Topics  . Smoking status: Current Everyday Smoker -- 0.3 packs/day for 25 years    Types: Cigarettes  . Smokeless tobacco: Not on file  . Alcohol Use: No  . Drug Use: No  . Sexually Active: Not on file   Other Topics Concern  . Not on file   Social History Narrative  . No narrative on file   Review of systems:Pertinent positives as outlined above. The remainder of the 18  point review of systems is negative  PHYSICAL EXAM BP 114/71  Pulse 64  Ht 5\' 2"  (1.575 m)  Wt 292 lb (132.45 kg)  BMI 53.41 kg/m2  SpO2 98%  General: Well-developed, well-nourished in no distress Head: Normocephalic and atraumatic Eyes:PERRLA/EOMI intact, conjunctiva and lids normal Ears: No deformity or lesions Mouth:normal dentition, normal posterior pharynx Neck: Supple, no JVD.  No masses, thyromegaly or abnormal cervical nodes Lungs: Normal breath sounds bilaterally without wheezing.  Normal percussion Cardiac: regular rate and rhythm with normal S1 and S2, no S3 or S4.  PMI is normal.  No pathological murmurs Abdomen: Normal bowel sounds, abdomen is soft and nontender without masses, organomegaly or hernias noted.  No hepatosplenomegaly MSK: Back normal, normal gait muscle strength and tone normal Vascular: Pulse is normal in all 4 extremities Extremities: No peripheral pitting edema Neurologic: Alert  and oriented x 3 Skin: Intact without lesions or rashes Lymphatics: No significant adenopathy In her Psychologic: Normal affect  ECG:  ASSESSMENT AND PLAN

## 2011-03-23 NOTE — Assessment & Plan Note (Signed)
[facility]                          [facility] OFFICE NOTE   [name], [name]                        MRN:          [id number]  DATE:08/05/2008                            DOB:          1961/06/20    HISTORY OF PRESENT ILLNESS:  The patient is a middle-aged female with  nonobstructive coronary artery disease, but with chronic chest pain.  The patient has a lot of anxiety as well as depression.  She exhibits  features of social phobias and is afraid to come into crowds;  she was  actually afraid to come to our office.  She is also in the process of  losing her brother due to cancer, and she has been very depressed about  this.  She reports to me that she at times cries.  She also feels that  her husband fusses quite a bit at her, and she feels a lot of pressure  raising her grandchildren.  All this leads to symptoms that she relates  to her chest pain and chest burning; however, there is no definite  evidence of ischemia and her EKG in the office today also shows no  evidence of ongoing ischemia.  She does take occasional nitroglycerin  with intermittent relief.   MEDICATIONS:  1. Aspirin 325 daily.  2. Lisinopril 5 mg p.o. daily.   PHYSICAL EXAMINATION:  VITAL SIGNS:  Blood pressure 149/83, heart rate  68, weight 279 pounds.  GENERAL:  Well-nourished white female in no apparent distress.  HEENT:  Pupils are equal.  Conjunctivae clear.  NECK:  Supple.  No carotid upstroke.  No carotid bruits.  LUNGS:  Clear breath sounds bilaterally.  HEART:  Regular rate and rhythm.  Normal S1, S2.  No murmurs, rubs, or  gallops.  ABDOMEN:  Soft, nontender.  No rebound or guarding.  Good bowel sounds.  EXTREMITIES:  No cyanosis, clubbing or edema.  NEUROLOGICAL:  The patient is alert, oriented, and grossly nonfocal.   PROBLEMS:  1. Nonobstructive coronary artery disease.  2. Depression, possible anxiety.  3. Negative __________.  4. Status post  bare-metal stent to high-grade circumflex, April 2007.      Last catheterization was actually in 2008, which showed mild      nonobstructive disease, normal left ventricular function.  5. Chronic obstructive pulmonary disease of tobacco use.  6. Obesity.  7. Hypertension.   PLAN:  1. I do not think the patient's chest pain is angina.  At this point      in time, I also do not think stress testing is going to lead to any      meaningful results.  2. The patient clearly is very anxious and particularly depressed      which leads her to have symptoms of chronic chest pain.  3. I told the patient that given her social phobias and her      depression, I do think treatment is absolutely necessary.  Taking      the liberty to prescribe citalopram 20 mg p.o. nightly to this      patient.  She also  has difficulty sleeping at night and we will      give her trazodone 50 mg p.o. nightly.  I told the patient that,      she actually does not have a primary care physician.  I will take      care of this for her to relieve her suffering, but she needs to      keep a close contact with me, and I will see her back in 6 weeks.      I have also given her my cell phone so that she can call me in case      she has any questions in the short term.     Learta Codding, MD,FACC  Electronically Signed    GED/MedQ  DD: 08/05/2008  DT: 08/06/2008  Job #: [id number]

## 2011-03-23 NOTE — Discharge Summary (Signed)
NAME:  [name], [name] NO.:  [id number]   MEDICAL RECORD NO.:  [id number]          PATIENT TYPE:  INP   LOCATION:  3315                         FACILITY:  [facility]   PHYSICIAN:  Rollene Rotunda, MD, FACCDATE OF BIRTH:  November 14, 1960   DATE OF ADMISSION:  07/24/2007  DATE OF DISCHARGE:  07/26/2007                         DISCHARGE SUMMARY - REFERRING   DISCHARGE DIAGNOSES:  1. Noncardiac chest discomfort.  2. Nonobstructive coronary artery disease.  3. Untreated hyperlipidemia.  4. History of hypertension.  5. Tobacco use.   HISTORY:  As listed below.   PROCEDURES PERFORMED:  Cardiac catheterization by Dr. Tonny Bollman on  July 25, 2007   SUMMARY OF HISTORY:  [name] is a 50 year old female who developed  left arm discomfort approximately 4 days ago took some aspirin with  resolution of symptoms.  However, on the day prior to admission,  symptoms redeveloped again relieved with aspirin.  Today, on the day of  admission, she again had left arm discomfort radiating into her chest as  pressure, gave it a 9 on a scale of 0/10.  At the emergency room, she  received nitroglycerin which improved her symptoms, but she continued to  complain of chest burning worsening with deep inspiration and smothering  in a supine position.  She has chronic dyspnea on exertion without  recent changes.   PAST MEDICAL HISTORY:  Is notable for:  1. Hypertension, untreated.  2. Hyperlipidemia, untreated.  3. Morbid obesity.  4. COPD.  5. History of hypokalemia and noncompliance.  6. Migraines.  7. Lower extremity discomfort with normal ABIs.  8. Obstructive sleep apnea for which he is not on CPAP.  9. GERD.  10.Last catheterization was in February 08, 2006 which showed a 60 percent      LAD, 90% OM which she received a bare-metal stent, EF 60%.   She continues to smoke.   LABORATORY:  On the 15th, H&H was 12.4 and 36.4, normal indices,  platelets 125, WBCs 7.4.  On the 17th  prior to discharge, H&H was 11.8  and 34.3, normal indices, platelets 107, WBCs 6.4.  Admission sodium was  138, potassium 3.7, BUN 12, creatinine 0.92, glucose 87.  Prior to  discharge, potassium was 3.4, BUN 9, creatinine 0.8, glucose 107.  LFTs  were within normal limits.  CK-MBs, relative indices and troponins were  within normal limits x3.  EKG, according to [facility] history and  physical, showed sinus rhythm without acute changes.  An x-ray according  to Lavella Hammock, P.A.-C., showed possible vascular congestion.  Official reports are pending this dictation.   HOSPITAL COURSE:  [name] was evaluated by Lavella Hammock, P.A.-C.,  and Dr. Eden Emms.  She was admitted to [facility] for further  evaluation.  Tobacco cessation consult was performed on the day of  admission.  Progression nurse assisted with needs given that the patient  states that she cannot afford her medications.  She states that she can  not even afford the $4 plan at [facility].  She was provided with the  refill outreach number and other services  and information about  reapplying for Medicaid.  She underwent cardiac catheterization on  July 25, 2007 to evaluate her symptoms.  Catheterization was  performed by Dr. Excell Seltzer.  She had a 50% proximal LAD, 30% mid  circumflex, 50% OM1 that was noted to be very small.  RCA was normal  except she did have a catheter induced spasm.  EF of 65%.  Dr. Excell Seltzer  felt that her symptoms were not related to her nonobstructive coronary  artery disease.  Recommended medical treatment and aggressive cardiac  risk factor modification.  On September 17, catheterization site was  intact.  Dr. Antoine Poche felt that the patient could be discharged home  with consideration of GI follow-up.  They recommended restarting proton-  pump inhibitor at discharge.  He felt that her thrombocytopenia was old.  However, it should be evaluated by her primary care physician.  She was   prescribed pravastatin for risk reduction.  She also has been given the  name of a local orthopedist that might see her in regards to her knee  discomfort.  Cardiac rehab assisted with her educational and ambulatory  needs.   DISPOSITION:  The patient is discharged home and asked to maintain low-  sodium heart-healthy diet.  Wound care and activities are per  supplemental sheet postcatheterization.  She was given permission to  return to work on July 28, 2007.  She received new prescriptions  for Protonix 40 mg daily and pravastatin 40 mg every night and asked to  continue aspirin 81 mg daily.  She was asked to call for a 1-week follow-  up with [facility] and also call Dr. Andee Lineman in  [place] for a groin check within the next week.  She was also given the  phone number for [facility] to arrange a 2-week appointment.  She  will need blood work in 6-8 weeks for FLP and LFTs given pravastatin  initiation.  She was advised no smoking or tobacco use and to bring all  her medications to all her appointments.      Joellyn Rued, PA-C      Rollene Rotunda, MD, [facility]  Electronically Signed    EW/MEDQ  D:  07/26/2007  T:  07/26/2007  Job:  [id number]   cc:   [facility]  Learta Codding, MD,FACC

## 2011-03-23 NOTE — Assessment & Plan Note (Signed)
[facility]                          [facility] CARDIOLOGY OFFICE NOTE   [name], [name]                        MRN:          [id number]  DATE:08/18/2007                            DOB:          07/01/1961    PRIMARY CARDIOLOGIST:  Learta Codding, MD,FACC.   REASON FOR VISIT:  Scheduled clinic followup.   Since last seen here in the clinic in July, the patient was briefly  hospitalized at [facility] (September 15-17) for evaluation of chest  pain.  According to her, she initially presented to [facility]  ER, had negative serial cardiac markers, but continued to have chest  pain despite medical therapy.  She was thus transferred to [facility],  where she subsequently underwent cardiac catheterization.  Of note,  records suggest the patient was not compliant with her medications and  also continued to smoke.   Coronary angiography revealed mild, diffuse nonobstructive CAD with at  most 50% stenosis of the LAD and second obtuse marginal artery.  Left  ventricular function was normal (EF 65%).  LV filling pressures were  mildly elevated, noted as improved from previous studies, and there was  no significant pulmonary hypertension.   The patient was discharged with instructions to arrange followup with a  [place] [facility] physician.  She reported to me today that she was unaware of  this.  She was also discharged on Protonix and pravastatin, both of  which were new.   The patient tells me today that she subsequently presented to [facility] ER two days later, again with chest pain, and was diagnosed  with having some knots overlying the right side of her chest and into  the right axillary area.  She is currently awaiting scheduling of a  breast mammogram at [facility], arranged by the [facility] ER  staff.  She also informs me that they are trying to get her established  with the clinic at [facility].   Electrocardiogram today  revealed NSR at 80 beats per minute with  incomplete right bundle branch block and no acute changes.   CURRENT MEDICATIONS:  1. Carvedilol 3.125 b.i.d.  2. Aspirin 325 daily.  3. Pravastatin 40 daily.  4. Protonix 40 daily.  5. Imdur 30 daily.   PHYSICAL EXAMINATION:  Blood pressure 119/74, pulse 81, regular.  Weight  257.6.  GENERAL:  A 50 year old female, morbidly obese, sitting upright in no  distress.  HEENT:  Normocephalic and atraumatic.  NECK:  Palpable carotid pulses without bruits.  LUNGS:  Clear to auscultation in all fields.  HEART:  Regular rate and rhythm (S1 and S2).  ABDOMEN:  Protuberant, nontender.  EXTREMITIES:  Right groin stable with no hematoma, ecchymosis, or bruit  on auscultation.  Palpable femoral and distal pulse.  NEURO:  Flat affect.  No focal deficit.   IMPRESSION:  1. Nonobstructive coronary artery.      a.     By recent catheterization.      b.     Status post bare metal stenting high grade CFX in April,  2007.      c.     Negative Adenosine stress Cardiolite; EF 63%, October, 2007.  2. Longstanding noncompliance.  3. Normal left ventricular function.  4. Chronic obstructive pulmonary disease/ongoing tobacco.  5. Morbid obesity.  6. History of hypertension.  7. History of hypokalemia (previously on Aldactone).  8. History of lower extremity edema.   PLAN:  1. Patient is strongly advised to remain compliant with her      medications, and to this end I will try to simplify this as much as      possible for her, in light of her longstanding history of      noncompliance and severe financial constraints; therefore, she is      to remain on low dose aspirin indefinitely.  A cholesterol-lowering      agent (most recently had pravastatin added) and a blood pressure      pill.  Regarding the latter, I will start her on lisinopril 5 mg      daily and discontinue carvedilol as well as Imdur.  She is to      continue on Protonix for empiric  treatment of probable GERD.  2. Once again, patient is strongly advised to stop smoking tobacco.  3. Patient strongly encouraged to establish with the family practice      clinic at [facility], given currently that she does not      have a primary care Rosemond Lyttle.  4. Recommend follow-up blood pressure/pulse check in two weeks with      our RN staff.  5. Schedule return clinic followup with myself and Dr. Andee Lineman in four      months.      Gene Serpe, PA-C  Electronically Signed      Learta Codding, MD,FACC  Electronically Signed   GS/MedQ  DD: 08/18/2007  DT: 08/18/2007  Job #: [id number]

## 2011-03-23 NOTE — Cardiovascular Report (Signed)
[name], [name] NO.:  [id number]   MEDICAL RECORD NO.:  [id number]          PATIENT TYPE:  INP   LOCATION:  3315                         FACILITY:  [facility]   PHYSICIAN:  Stacey Fells. Excell Seltzer, MD  DATE OF BIRTH:  1961/11/05   DATE OF PROCEDURE:  DATE OF DISCHARGE:                            CARDIAC CATHETERIZATION   PROCEDURE:  Right heart catheterization, left heart catheterization,  selective coronary angiography, left ventricular angiography.   INDICATIONS:  [name] is a 50 year old woman with known CAD.  She has  had multiple catheterizations.  Her last catheterization greater than 1  year ago showed stable CAD.  She has undergone FFR studies of her LAD  which have been negative.  She has never had PCI or any  revascularization procedure performed.  She presented with recurrent  chest pain and dyspnea and was referred for right and left heart  catheterization.   Risks and indications for the procedure were explained.  Informed  consent was obtained.  A 6-French sheath was placed in the right femoral  artery and a 7-French sheath placed in the right femoral vein, using the  modified Seldinger technique.  The right heart catheterization was  performed with a Swan-Ganz catheter.  Pressures were recorded throughout  the right heart chambers.  Oxygen saturations were drawn from the  pulmonary artery and aorta.  Fick cardiac output was calculated.  An  angled pigtail catheter was inserted into the left ventricle, where  pressures were recorded.  A left ventriculogram was performed.  A  pullback across the aortic valve was done.  Selective coronary  angiography was performed, using standard 6-French Judkins catheters.  All catheter sheaths were performed over a guidewire.  The patient  tolerated the procedure well.   FINDINGS:  Right atrial pressure mean of 12, right ventricular pressure  34/15, pulmonary artery pressure 31/19 with a mean of 24, pulmonary  capillary wedge pressure mean of 17, left ventricular pressure 122/21,  aortic pressure 121/66 with a mean of 90.   Oxygen saturation pulmonary artery 62, aorta 94.   Cardiac output by Fick method 3.7 liters per minute.  Cardiac index 1.7  liters per minute per meter squared.   CORONARY ANGIOGRAPHY:  The left mainstem is angiographically normal.  It  bifurcates into the LAD and left circumflex.   The LAD is large-caliber vessel that courses down and wraps around the  LV apex.  There is an area of 50% stenosis, just beyond the first  diagonal branch.  This is unchanged and stable from previous studies.  This was the focus of a previous fractional flow reserve study.  The  first diagonal is large and has minor non-obstructive plaque.  The  distal LAD is diffusely diseased with minor luminal irregularities.   The left circumflex is large caliber.  It supplies a tiny first OM and a  medium-size second OM.  The second OM has a 50% ostial stenosis.  The AV  groove circumflex has a 30% stenosis in it's midportion, at the origin  of the second OM.  The circumflex is dominant  and supplies a left PDA.  There is no obstructive disease in the PDA.  There is a small left  posterolateral branch.   The right coronary artery is medium sized.  It is non-dominant.  There  is mild catheter-induced spasm.  There is no significant angiographic  stenosis.   Left ventricular function is normal.  The LVEF is estimated at 65%.  There is no mitral regurgitation.   ASSESSMENT:  1. Mild diffuse non-obstructive coronary artery disease.  2. Normal left ventricular systolic function.  3. Mild elevation of LV filling pressures, but improved from past      studies.  4. No significant pulmonary arterial hypertension.   PLAN:  Recommend continued medical therapy for the patient's CAD. Dr.  Excell [name] dictated      Stacey Fells. Excell Seltzer, MD  Electronically Signed     MDC/MEDQ  D:  07/25/2007  T:  07/26/2007   Job:  [id number]

## 2011-03-23 NOTE — H&P (Signed)
NAME:  [name], [name] NO.:  [id number]   MEDICAL RECORD NO.:  [id number]          PATIENT TYPE:  INP   LOCATION:  3315                         FACILITY:  [facility]   PHYSICIAN:  Noralyn Pick. Eden Emms, MD, FACCDATE OF BIRTH:  15-Mar-1961   DATE OF ADMISSION:  07/24/2007  DATE OF DISCHARGE:                              HISTORY & PHYSICAL   PRIMARY CARE PHYSICIAN:  The primary care physician is at [facility].   PRIMARY CARDIOLOGIST:  Lewayne Bunting, M.D.   CHIEF COMPLAINT:  Chest pain.   HISTORY OF PRESENT ILLNESS:  The patient is a 50 year old female with a  history of coronary artery disease.  Four days ago she had an episode of  left arm pain for which she took aspirin and the symptoms resolved after  a period of greater than one hour.  Yesterday she had recurrent symptoms  which also resolved after taking aspirin.  Today she again had left arm  pain and called the office who told her to go to the [facility] Emergency  Room.  In the [facility] Emergency Room her pain radiated up into her  chest and she describes it as a substernal pressure that reached to  9/10.  She received sublingual nitroglycerin as well as nitroglycerin  paste and IV metoprolol.  She stated that she is not sure that the  medication helped any but states that she does feel better when she sits  up.  She also had some chest burning.  At times the chest pain is worse  with deep inspiration.  Any time she gets in a supine position, she gets  a feeling of smothering.  She has chronic dyspnea on exertion which  has not changed recently.  At this point in time the chest burning has  resolved but the chest pressure has returned and it is now a 5/10.  At  this time it is unchanged with deep inspiration but it is worse with  chest wall palpation.   PAST MEDICAL HISTORY:  1. Status post cardiac catheterization 02/08/06 showing a normal left      main, LAD 30% and 62%, circumflex okay, OM  90%, reduced to 10% with      a bare metal stent, RCA luminal irregularities, and an EF of 60%.  2. Status post adenosine Myoview to evaluate the LAD territory for      ischemia, no ischemia.  3. Hypertension.  4. Hyperlipidemia.  5. Family history of coronary artery disease.  6. Morbid obesity.  7. Chronic obstructive pulmonary disease.  8. History of hypokalemia.  9. History of noncompliance secondary to financial issues.  10.History of migraines.  11.History of lower extremity pain with normal ABIs  12.History of obstructive sleep apnea, not on CPAP.  13.Gastroesophageal reflux disease.   ALLERGIES:  NO KNOWN DRUG ALLERGIES.   MEDICATIONS:  Currently she is taking aspirin 325 mg a day and she  states she is unable to afford any other medication even if it could be  obtained at [facility] for four dollars.   SOCIAL HISTORY:  She lives  in [place] with her husband and is currently  unemployed.  She has a greater than 35 pack year history of ongoing  tobacco use but denies alcohol or drug abuse.   FAMILY HISTORY:  Her mother died at age 66 of cancer but not coronary  artery disease and her father is alive at age 22 with a history of  coronary artery disease but no siblings have coronary artery disease.   REVIEW OF SYSTEMS:  She complains of some chills.  Dentition is poor.  The chest pain is as described above.  She coughs daily and wheezes  occasionally.  She has had no fevers or sweats recently.  She has  occasional paroxysmal nocturnal dyspnea.  She has had problems with  depression and anxiety.  She has chronic arthralgias.  She has  occasional heartburn but denies hematemesis, hemoptysis, or melena.  A  full 14 point review of systems is otherwise negative.   PHYSICAL EXAMINATION:  VITAL SIGNS:  Temperature is 97.4, blood pressure  100/55, pulse 60, respiratory rate 18, O2 saturation 100% on three  liters.  GENERAL:  She is a well-developed obese white female who appears   anxious.  HEENT:  Normal.  NECK:  There is no lymphadenopathy, thyromegaly, bruit, or JVD noted.  CVA:  Heart is regular in rate and rhythm with an S1 and S2 and no  significant murmur, rub or gallop is noted.  Distal pulses are intact in  all four extremities.  LUNGS:  The lungs are essentially clear to auscultation bilaterally.  SKIN:  No rashes or lesions are noted.  ABDOMEN:  The abdomen is soft and she has some slight left abdominal  tenderness but there is no guarding or rebound and she has active bowel  sounds.  EXTREMITIES:  There is no cyanosis, clubbing, or edema noted.  MUSCULOSKELETAL:  There is no joint deformity or effusions and no spine  or CVA tenderness.  NEUROLOGICAL:  She is alert and oriented.  Cranial nerves II through XII  are grossly intact.   INVESTIGATIONS:  Chest x-ray shows no acute disease but possibly some  vascular congestion.  EKG shows sinus rhythm with a rate of 66 with no  acute ischemic changes and is unchanged from an EKG dated 2007.   LABORATORY VALUES:  Hemoglobin 14, hematocrit 40.3, WBC 6.4, platelets  140.  Sodium 140, potassium 3.7, chloride 106, CO2 of 26, BUN of 12,  creatinine of 0.9, glucose 94, CK-MB and troponin I negative times one.  INR 1.0.   IMPRESSION:  1. Chest pain:  The patient is a 50 year old obese white female who is      still smoking and who has not been taking any of her cardiac drugs      except aspirin.  She has no acute EKG changes and she has some      minor lateral T-wave changes that are not significantly different      from an EKG dated 04/07.  She will be admitted.  We will      continue to rule out myocardial infarction.  We will add DVT      Lovenox to her medication regimen and start her on a statin as      well.  She was borderline hypotensive after IV beta blockade and      sublingual nitroglycerin and is currently on a nitroglycerin drip.      Blood pressure will be followed closely.      Theodore Demark,  PA-C      Peter C. Eden Emms, MD, [facility]  Electronically Signed    RB/MEDQ  D:  07/24/2007  T:  07/25/2007  Job:  [id number]   cc:   Learta Codding, MD,FACC  Theodore Demark, PA-C  Noralyn Pick Eden Emms, MD, [facility]

## 2011-03-23 NOTE — H&P (Signed)
NAME:  [name], [name] NO.:  [id number]   MEDICAL RECORD NO.:  [id number]          PATIENT TYPE:  INP   LOCATION:  2505                         FACILITY:  [facility]   PHYSICIAN:  Arturo Morton. Riley Kill, MD, FACCDATE OF BIRTH:  [birth year]   DATE OF ADMISSION:  01/15/2009  DATE OF DISCHARGE:                              HISTORY & PHYSICAL   PRIMARY CARDIOLOGIST:  Learta Codding, MD, [facility]   CHIEF COMPLAINT:  Chest pain.   HISTORY OF PRESENT ILLNESS:  This is a 50 year old female with a known  history of CAD (status post PCI in 2007 with Vision bare-metal stent to  circumflex marginal, last cath was in September 2008 that showed mild  diffuse nonobstructive CAD, EF equals 65%, mild increased LV filling  pressures, and no significant pulmonary hypertension), hypertension,  ongoing tobacco abuse disorder, COPD, and noncompliance with  medications/diet/exercise presenting with worsening of angina over the  last 2-3 weeks.  The patient had 10/10 chest pain this last Saturday  that came on with exertion but did not subside with rest as it usually  does which prompted her to call EMS and to be evaluated at [facility]  Emergency Department.  She had a 2-D Myoview which was completed  yesterday and per the patient was concerning to Dr. Andee Lineman for possible  reversible ischemia and therefore she is being transferred to [facility] today for cardiac catheterization.  The patient now has mild  chest pain and shortness of breath and vital signs were stable.   PAST MEDICAL HISTORY:  1. Coronary artery disease (as described above).  2. Poor compliance (the patient has very low income).  3. Tobacco abuse disorder (half pack per day x30 years).  4. COPD.  5. Hypertension.  6. Obesity.  7. Depression and anxiety.  8. Obstructive sleep apnea.   SOCIAL HISTORY:  The patient lives in [place] with her husband and  grandson.  She has 4 living children and one child that is  deceased.  She works part-time in [employer].  She has a 15-pack year  smoking history.  She drinks no alcohol, takes no illegal drugs, no  herbal supplements, has a regular diet, and does no regular exercise.   FAMILY HISTORY:  Mother deceased at age 73 of breast cancer.  Father is  living aged 14 with diabetes mellitus, questionable history of CAD.  Siblings have no history of CAD.   REVIEW OF SYSTEMS:  The patient denies fevers or chills, but was clammy  during her most severe chest pain on Saturday evening.  She also had  shortness of breath.  She has had dyspnea on exertion recently.  She has  chronic orthopnea and mild PND occasionally.  She has intermittent lower  extremity edema.  Last Saturday she had tachy palpitations.  She had  presyncope, but no serious concerns for loss of consciousness.  She also  had a productive cough after the chest pain was at its most severe but  has resolved at this point.  She had nausea but no vomiting and no  significant  abdominal pain.  She currently does not have these symptoms.  She has chronic cold intolerance in the evening.  Otherwise, see HPI.  All other systems reviewed and were negative.   ALLERGIES:  NKDA.   MEDICATIONS:  1. Aspirin 325 mg p.o. daily.  2. Lisinopril 5 mg p.o. daily (the patient reports poor compliance).   PHYSICAL EXAMINATION:  VITAL SIGNS:  Temperature 97.9 degrees  Fahrenheit, BP 116/63, pulse 51, respiratory rate 16, O2 saturation 93%  on room air, and weight 118.1 kg.  GENERAL:  She is alert and oriented x3 in no apparent distress, lying in  a bed with head of bed at 45 degrees with one pillow.  Able to speak and  move fairly easily without significant respiratory distress.  HEENT:  Head was normocephalic and atraumatic.  Pupils equal, round, and  reactive to light.  Extraocular muscles were intact.  Nares were patent  without discharge.  Dentition was poor.  Oropharynx was without erythema  or  exudate.  NECK:  Supple without lymphadenopathy.  No thyromegaly.  No JVD.  No  bruits.  CARDIAC:  Heart rate was regular with audible S1 and S2.  No clicks,  rubs, murmurs, or gallops.  Pulses were 2+ and equal in both upper and  lower extremities bilaterally.  LUNGS:  Clear to auscultation bilaterally.  SKIN:  No rashes, lesions, or petechiae.  ABDOMEN:  Soft, nontender, and nondistended.  The patient is morbidly  obese.  She had normal abdominal bowel sounds.  No rebound or guarding.  No hepatosplenomegaly and no pulsations.  EXTREMITIES:  No clubbing, cyanosis, or edema.  MUSCULOSKELETAL:  No joint deformities or effusions.  No spinal or CVA  tenderness.  NEUROLOGIC:  Cranial nerves II-XII were grossly intact.  Strength was  5/5 in all extremities and axial groups.  Normal sensation throughout  and normal cerebellar function.   RADIOLOGY:  No chest x-ray available.   EKG:  There is no 12-lead EKG in her chart at this point and one will be  ordered ASAP.   LABORATORY DATA:  No laboratory data in the computer at this point.  Per  note in her chart here, she had normal TSH.  LDL was 124.  She has had  low B12 levels recently.  Potassium normal at 3.7 per note.   ASSESSMENT AND PLAN:  This is a 50 year old female with a known history  of coronary artery disease (status post bare-metal stent to the  circumflex marginal in 2007 and nonobstructive disease with normal  ejection fraction and no significant pulmonary hypertension in 2008),  hypertension, ongoing tobacco abuse disorder, chronic obstructive  pulmonary disease, and noncompliance with medications/diet/exercise  presenting with unstable angina and per the patient a Myoview concerning  for reversible ischemia.  1. Unstable angina.  We will proceed the catheterization today.  We      will start her on IV heparin.  We are going to order complete set      of labs as there is no easy access to lab values that were       completed at [facility] at this time plus to have the most recent      data.  The patient will have her ACE inhibitor restarted after her      cardiac catheterization if she can tolerate, otherwise she will be      given aspirin and simvastatin 40 mg p.o. daily for elevated LDL and      stabilization of coronary artery  disease.  2. Hypertension.  Continue home medications post catheterization.  3. Chronic obstructive pulmonary disease.  The patient is currently      stable; however, we will have p.r.n. Combivent at 90 and 18 mcg q.6      hours p.r.n.  4. Tobacco abuse disorder.  She will receive a tobacco cessation      counseling.      Jarrett Ables, PAC      Arturo Morton. Riley Kill, MD, [facility]  Electronically Signed    MS/MEDQ  D:  01/15/2009  T:  01/15/2009  Job:  [id number]

## 2011-03-23 NOTE — Assessment & Plan Note (Signed)
[facility]                          [facility] OFFICE NOTE   [name], [name]                        MRN:          [id number]  DATE:03/17/2007                            DOB:          11-Jul-1961    HISTORY OF PRESENT ILLNESS:  The patient is a 50 year old female with a  history of coronary arteries. The patient was yesterday seen in the  emergency room with atypical chest pain. One set of cardiac enzymes was  obtained which was negative. Her EKG was normal. The patient ran out of  her medications. The patient stated that she has no financial means of  affording them. She is now going to the health department to try to get  financial support for her medications.   MEDICATIONS:  Aspirin and Nexium.   PHYSICAL EXAMINATION:  VITAL SIGNS:  Blood pressure 150/96, heart rate  69, weight is 255 pounds.  NECK:  Normal carotid upstrokes no carotid bruits.  LUNGS:  Clear breath sounds bilaterally.  HEART:  Regular rate and rhythm. Normal S1, S2.  ABDOMEN:  Soft.  EXTREMITIES:  No clubbing, cyanosis or edema.  NEUROLOGIC:  The patient is alert, oriented and grossly nonfocal.   PROBLEM LIST:  1. Coronary artery disease.      a.     Status post bare metal stent to the high grade circumflex       April 2007.      b.     Residual 60% proximal LAD lesion.      c.     Normal LV function.      d.     Negative adenosine Cardiolite study October 2007.  2. Hypertension.  3. Hypokalemia.  4. Chronic obstructive pulmonary disease and ongoing tobacco use.  5. Morbid obesity.  6. Chronic lower extremity and venous insufficiency.  7. Rule out claudication.  8. Medication noncompliance.   PLAN:  1. The patient was given a prescription for Imdur.  2. She will apply for Medicaid and go to the Health Department to try      to get some financial support.  3. I did order ABIs on this patient to make sure she does not have      peripheral vascular       disease.  4. The patient will followup with [place] in the office.     Learta Codding, MD,FACC     GED/MedQ  DD: 03/17/2007  DT: 03/17/2007  Job #: [id number]

## 2011-03-23 NOTE — Assessment & Plan Note (Signed)
[facility]                          [facility]   [name], [name]                        MRN:          [id number]  DATE:05/18/2007                            DOB:          10/07/1961    CARDIOLOGY OFFICE NOTE   CARDIOLOGIST:  Dr. Lewayne Bunting.   PRIMARY CARE PHYSICIAN:  None.  She does see a Bunny Lowdermilk, first name  Angie, unknown last name, at the health department.   HISTORY OF PRESENT ILLNESS:  [name] is a 50 year old female patient  with a history of coronary artery disease status post bare metal  stenting to the circumflex in April 2007.  At catheterization, she did  have residual 60% proximal LAD lesion.  She had a negative Cardiolite  study in October of 2007.  She presents to the office today for  followup.  She notes that, over the last 3 days, she has had constant  right-sided chest burning.  She denies any changes with exertion.  Denies any changes with positioning.  Denies any pleuritic symptoms.  She notes significant dyspnea on exertion over the last year.  She  really describes class III symptoms, but she is quite limited by her  right knee osteoarthritis.  She also sleeps on approximately 8 pillows  per night and sometimes awakens short of breath.  She states that she  has been diagnosed with sleep apnea, but has been unable to get CPAP  machine because she cannot afford the co-pay.  She denies any exertional  chest heaviness or tightness.  She denies any syncope or near-syncope,  but she does get light-headed from time to time.  She does note  occasional wheezing.  She continues to smoke cigarettes and has done so  for the last 35 years.  She denies any fevers, chills, or cough.  Denies  any hemoptysis.   She does tell me that she has had some headaches recently.  These are  not associated with isosorbide.  They seem to be stress-related as they  do originate in her posterior neck muscles and radiate up to the  top of  her head.  She does note some photophobia and nausea with these, but  denies any scatoma.  She has had migraines in the past.   CURRENT MEDICATIONS:  1. Aspirin 325 mg daily.  2. Nexium 40 mg daily.  3. Isosorbide 30 mg daily.   ALLERGIES:  No known drug allergies.   PHYSICAL EXAM:  She is a well-developed, well-nourished female in no  acute distress.  Blood pressure 135/82, pulse 79, weight 258 pounds.  HEENT:  Normal.  NECK:  Without JVD.  LYMPHATICS:  Without lymphadenopathy.  CARDIAC:  Normal S1, S2.  Regular rate and rhythm without murmurs.  LUNGS:  Clear to auscultation bilaterally without wheeze, rales, or  rhonchi.  ABDOMEN:  Soft and nontender.  EXTREMITIES:  Without edema.  Calves soft and nontender.  VASCULAR:  Carotids without bruits bilaterally.  CHEST:  The chest wall is quite tender to palpation over the right side.   ELECTROCARDIOGRAM:  Reveals sinus rhythm, the heart  rate is 78, no  ischemic changes.  No significant changes from previous tracing.  She  does have interventricular conduction delay.   IMPRESSION:  1. Atypical chest pain.  2. Coronary artery disease.      a.     Status post bare metal stenting to the circumflex in April       2007.      b.     Residual 60% proximal left anterior descending lesion.      c.     Negative adenosine Cardiolite in October 2007.  3. Preserved left ventricular function.  4. Hypertension.  5. Chronic obstructive pulmonary disease with ongoing tobacco abuse.  6. Morbid obesity.  7. Chronic lower extremity edema and venous insufficiency.  8. Hypertension.  9. History of hypokalemia.  10.History of medication noncompliance.  11.History of lower extremity pain.      a.     Ankle brachial index Mar 29, 2007, normal (right 1.1, left       1.2).  12.Headaches.   PLAN:  The patient presents to the office today for followup and is  complaining of chest discomfort.  These symptoms are very atypical and  she is  very tender to palpation over her right chest.  I think she has  musculoskeletal chest pain.  I have recommended heat and Tylenol.  She  can try ibuprofen 2 to 3 times a day for 3 days only and then stop to  see if this helps.  She also complains of some headaches.  I have asked  her to follow up with her regular Kaly Mcquary at the health department for  this.  I do not think these are related to her Imdur therapy.  She also  needs to continue her medical therapy for coronary disease.  She is off  of her beta blocker and her Statin secondary to financial concerns.  I  have written her a prescription for metoprolol 25  mg b.i.d. and simvastatin 40 mg nightly.  These can be filled for $4 at  [facility].  She will need followup lipids and LFTs in the next 8 to 12  weeks and follow up in this office in 3 months.     Tereso Newcomer, PA-C  Electronically Signed      Learta Codding, MD,FACC  Electronically Signed   SW/MedQ  DD: 05/18/2007  DT: 05/19/2007  Job #: [id number]

## 2011-03-23 NOTE — Assessment & Plan Note (Signed)
[facility]                          [facility]   ERYNN, VACA                        MRN:          [id number]  DATE:01/22/2008                            DOB:          [birth year]    HISTORY OF PRESENT ILLNESS:  The patient is a middle-aged female with  nonobstructive coronary artery disease.  The patient is status post bare  metal stent to a high-grade circumflex April 2007.  She had a negative  Cardiolite in 2007 following this.  She has done well.  She has no  recurrent chest pain.  She has no orthopnea or PND, no palpitations or  syncope.  She is short of breath on exertion, but is quite overweight.  She also has a long-standing history of noncompliance.  The patient also  stopped taking her Protonix, pravastatin and carvedilol.   MEDICATIONS:  Currently taking:  1. Lisinopril 5 mg daily.  2. Aspirin 325 daily.   PHYSICAL EXAMINATION:  VITAL SIGNS:  Blood pressure is 125/76, heart  rate 61, weight 267 pounds.  NECK:  Normal carotid upstroke.  No carotid bruits.  LUNGS:  Clear breath sounds bilaterally.  HEART:  Regular rate and rhythm with normal S1, S2,  No murmur, rubs or  gallops.  ABDOMEN:  Soft, nontender with no rebound or guarding.  Good bowel  sounds.  EXTREMITIES:  No cyanosis, clubbing or edema.  NEURO:  Patient alert, oriented.  Grossly nonfocal.   PROBLEM LIST:  1. Nonobstructive coronary artery disease.  2. Status post bare metal stent high-grade circumflex April 2007.  3. Negative Cardiolite stress study October 2007.  4. Noncompliant.  5. Normal LV function.  6. Chronic obstructive pulmonary disease and tobacco abuse.  7. Obesity.  8. Hypertension.  9. History of lower extremity edema, stable.   PLAN:  1. Patient is doing quite well from a cardiovascular standpoint.  She      has a chest pain which appears to be nonanginal and her shortness      of breath is chronic secondary to deconditioning.  2. At the present needs to remain compliant with her mediations and I      have given her a new      prescription for statin drug therapy which she did not take.  3. The patient in next six months will need to have a Cardiolite      stress study done.     Learta Codding, MD,FACC  Electronically Signed    GED/MedQ  DD: 01/22/2008  DT: 01/22/2008  Job #: [id number]

## 2011-03-23 NOTE — Discharge Summary (Signed)
[name], WELLNITZ NO.:  [id number]   MEDICAL RECORD NO.:  [id number]          PATIENT TYPE:  INP   LOCATION:  2501                         FACILITY:  [facility]   PHYSICIAN:  Verne Carrow, MDDATE OF BIRTH:  [birth year]   DATE OF ADMISSION:  01/15/2009  DATE OF DISCHARGE:  01/16/2009                               DISCHARGE SUMMARY   PRIMARY CARDIOLOGIST:  Learta Codding, MD, [facility].   CATHETERIZATION CARDIOLOGIST:  Veverly Fells. Excell Seltzer, MD   PRIMARY CARE PHYSICIAN:  Not listed.   PROCEDURES PERFORMED DURING HOSPITALIZATION:  Cardiac catheterization  completed by Dr. Tonny Bollman on January 15, 2009, revealing  nonobstructive disease calcified 40% proximal lesion in the left  anterior descending, in-stent restenosis 10% at the ostial OM1, luminal  irregularities 30% proximal lesion in the right coronary artery with  normal left ventricular function.   FINAL DISCHARGE DIAGNOSES:  1. Coronary artery disease.      a.     Status post catheterization revealing nonobstructive       disease.      b.     Status post bare-metal stent to the ostial OM1 in 2007 with       a Vision bare-metal stent.  2. Medical noncompliance secondary to low income.  3. Ongoing tobacco abuse.  4. Chronic obstructive pulmonary disease.  5. Hypertension.  6. Thrombocytopenia.   HOSPITAL COURSE:  This 50 year old Caucasian female with known history  of CAD with stent to the first OM of the circumflex in 2007 who  presented to [facility] with 10/10 chest pain occurring with  exertion, but did not subside with rest.  The patient had a 2-D Myoview,  which was completed and per Dr. Andee Lineman, results revealed reversible  ischemia and therefore the patient was transferred to [facility] for  cardiac catheterization.  The patient did undergo cardiac  catheterization as described above.  The patient will have no medication  changes at this time.   Secondary to issues of noncompliant  concerning her meds and low income  Social Services was asked to see her and drug assistance form has been  provided along with generics recommended through [employer]  The patient will be sent home on Zocor, lisinopril, and  albuterol inhaler along with aspirin.  Prescriptions were given for 3  day supply for her when she goes home and drug assistance form was  filled out for lisinopril and Zocor.   The patient has a history of ongoing tobacco abuse, and she had tobacco  cessation counseling during hospitalization.   DISCHARGE LABORATORY DATA:  Sodium 139, potassium 4.0, chloride 106, CO2  27, BUN 14, creatinine 1.0, glucose 85, and BNP less than 30.1.  Hemoglobin 13.5, hematocrit 38.2, white blood cells 5.9, platelets are  88, troponin less than 0.1 x3, total cholesterol 157, triglycerides 99,  HDL 40, and LDL 97.   DISCHARGE MEDICATIONS:  1. Aspirin 81 mg daily.  2. Zocor 40 mg at bedtime.  3. Lisinopril 5 mg daily.  4. Combivent inhaler 2 puffs every 6 hours p.r.n.  ALLERGIES:  ACE INHIBITORS causing hypotensive; however, she was  normotensive during hospitalization.   FOLLOWUP PLANS AND APPOINTMENTS:  1. The patient will follow up with Dr. Lewayne Bunting on Tuesday February 04, 2009, at 2:15 p.m.  2. The patient has been given postcardiac catheterization instructions      with particular emphasis on the right groin site for evidence of      bleeding, hematoma, or infection.  3. The patient has been given prescription for 3 days of medications      to include Zocor, lisinopril, and albuterol inhaler and entire      prescriptions can be filled.  4. The patient has been given drug assistance form through social      services to assist with medicines.  5. The patient has been given the generic versions, so that she may      use her [facility] 4 dollar drugs to help with cost.  6. The patient has been advised on smoking cessation.  7. The patient will  need to follow up with her primary care physician      for medical management.   Time spent with the patient to include physician time 35 minutes.      Bettey Mare. Lyman Bishop, NP      Verne Carrow, MD  Electronically Signed    KML/MEDQ  D:  01/16/2009  T:  01/16/2009  Job:  [id number]

## 2011-03-23 NOTE — Assessment & Plan Note (Signed)
[facility]                          [facility]   PROSPERITY, DARROUGH                        MRN:          [id number]  DATE:02/04/2009                            DOB:          1961/03/12    HISTORY OF PRESENT ILLNESS:  The patient is a middle-aged female with a  history of nonobstructive coronary artery disease.  The patient also has  history of anxiety and depression and was unable to afford her  medications.  She states that she has found a job, is working 4 hours.  She feels her anxiety and depression has improved.  She still has  difficulty sleeping.  She was recently hospitalized at [facility] with substernal chest pain.  She underwent cardiac  catheterization, was found and states she has nonobstructive coronary  artery disease.  She is unfortunately continues to smoke tobacco and she  also has COPD.  She has no complications post catheterization and  actually feels improved.   MEDICATIONS:  1. Aspirin 325 mg p.o. daily.  2. Lisinopril 5 mg p.o. daily.  3. Simvastatin 20 mg p.o. nightly.   PHYSICAL EXAMINATION:  VITAL SIGNS:  Blood pressure is 105/65, heart  rate is 80 beats per minute, weight is 279 pounds.  GENERAL:  Overweight white female who looks in no apparent distress.  HEENT:  Pupils are isocoric.  Conjunctivae clear.  NECK:  Supple.  Normal carotid upstroke.  No carotid bruits.  LUNGS:  Clear breath sounds bilaterally.  HEART:  Regular rate and rhythm.  Normal S1 and S2.  No murmur, rubs, or  gallops.  ABDOMEN:  Soft, nontender.  No rebound.  No guarding.  Good bowel  sounds.  EXTREMITIES:  No cyanosis, clubbing, or edema.  NEURO:  The patient is alert, oriented, and grossly nonfocal.   PROBLEM LIST:  1. Nonobstructive coronary artery disease.  2. History of anxiety and depression.  3. Status post bare-metal stent to the high-grade circumflex lesion in      April 2007.  4. Chronic obstructive  pulmonary disease.  5. Obesity.  6. Hypertension.   PLAN:  1. We have prescribed the patient trazodone if they can afford this      for her insomnia.  I do think her depression and anxiety have      improved, and we will hold off on citalopram.  2. She does complain of significant pain in her knees secondary to      osteoarthritis particularly when she is working and standing on her      feet.  I asked her to take Tylenol high dose on a      p.r.n. basis with careful monitoring of her renal function.  She      needs to take it very      frequently.  3. The patient will follow up with [place] in 6 months.     Learta Codding, MD,FACC  Electronically Signed    GED/MedQ  DD: 02/04/2009  DT: 02/05/2009  Job #: [id number]

## 2011-03-26 NOTE — Assessment & Plan Note (Signed)
[facility]                            [facility]   [name]                        MRN:          [id number]  DATE:06/28/2006                            DOB:          Mar 05, 1961    PRIMARY CARDIOLOGIST:  Learta Codding, MD, [facility]   REASON FOR OFFICE VISIT:  Scheduled 1 month followup.  Please refer to Dr.  Margarita Mail office note of May 19, 2006 for full details.   The patient now presents in followup for management of chronic dyspnea and  lower extremity edema.  She was placed on a salt/water restrictive diet, as  well as Lasix 40 b.i.d. by Dr. Andee Lineman when last seen here in the office.   Since then, she has lost 7 pounds.  She reports compliance with her  medications including Plavix.  Of note, she reports today that she was  recently improved for Medicaid.   Unfortunately, Ms. Kil continues to smoke.  She also complains of  arthritis of both knees (right greater than left) and reports that she was  to have had total knee replacement approximately 3 years ago, but had no  insurance at that time.   The patient also has occasional chest tightness, but with no recent  exacerbation from her baseline.  She has not taken any nitroglycerin since  last seen here in the office.   The patient also states that she is awaiting a formal evaluation for  possible obstructive sleep apnea.   CURRENT MEDICATIONS:  1. Aspirin 325 daily.  2. Nexium 40 daily.  3. Lipitor 20 daily.  4. Plavix 75 daily.  5. Toprol-XL 50 daily.  6. Lasix 40 b.i.d.   PHYSICAL EXAMINATION:  VITAL SIGNS:  Blood pressure 100/60, pulse 74 and  regular, weight 268 (down 9 pounds).  NECK:  Palpable carotid pulses with no bruits.  LUNGS:  Clear to auscultation in all fields.  HEART:  Regular rate and rhythm (S1 and S2).  No significant murmurs.  ABDOMEN:  Protuberant, nontender.  EXTREMITIES:  There was 1+ pedal edema.  NEUROLOGIC:  Flat affect.  No focal  deficit.   IMPRESSION:  1. Chronic fatigue.  2. Lower extremity edema.      a.     Improved on diuretic regimen.  3. Ongoing tobacco.  4. Coronary artery disease.      a.     Status post BMS, CFX, April 2007.      b.     Normal left ventricular function.  5. Possible obstructive sleep apnea.  6. Morbid obesity.  7. Chronic dyspnea.  8. Hypertension.  9. Hyperlipidemia.  10.Arthritis.   PLAN:  1. Check followup BMET today for close monitoring of potassium and renal      function.  Of note, most recent BMET showed a potassium level of 3.4.  2. Refer patient for apnea link evaluation for obstructive sleep apnea.  3. Schedule return clinic followup with Dr. Lewayne Bunting in 6 months.  4. Discontinue Plavix.  The patient is 4 months post placement of a bare  metal stent and has a history of noncompliance.  5. The patient will need followup fasting lipid/liver profile at the time      of her next scheduled followup.                                   Gene Serpe, PA-C                                Learta Codding, MD, [facility]   GS/MedQ  DD:  06/28/2006  DT:  06/29/2006  Job #:  [id number]

## 2011-03-26 NOTE — Cardiovascular Report (Signed)
NAME:  [name], [name] NO.:  [id number]   MEDICAL RECORD NO.:  [id number]          PATIENT TYPE:  INP   LOCATION:  2022                         FACILITY:  [facility]   PHYSICIAN:  Arturo Morton. Riley Kill, M.D. [facility] OF BIRTH:  Mar 03, 1961   DATE OF PROCEDURE:  02/04/2005  DATE OF DISCHARGE:                              CARDIAC CATHETERIZATION   INDICATIONS:  Ms. Truxillo is a 50 year old woman with recurrent episodes of  substernal chest pain.  Her enzymes have been negative.  The current study  is done to assess coronary anatomy.   PROCEDURE:  1.  Left heart catheterization.  2.  Selective coronary territory.  3.  Selective left ventriculography.   DESCRIPTION OF PROCEDURE:  The patient was brought to the catheterization  laboratory and prepped and draped in the usual fashion.  Through an  puncture, the right femoral artery was easily entered.  We used a Smart  needle to gain access to the femoral artery.  A 6-French sheath was placed  and views of the left and right coronaries were obtained in multiple  angiographic projections.  Central aortic and left ventricular pressures  were measured with a pigtail.  Ventriculography was performed in the RAO  projection.  Following a pressure pullback, all catheters were subsequently  removed.  The femoral sheath was removed and the holding area.  There were  no complications.   HEMODYNAMIC DATA.:  1.  Central aortic pressure 144/76.  2.  Left atrial pressure 148/22.  3.  No gradient on pullback across aortic valve.   ANGIOGRAPHIC DATA.:  1.  Ventriculography was performed in the RAO projection. Overall systolic      function is preserved.  2.  The left main is free of critical disease.  3.  The left anterior descending artery demonstrates about a 60% to 70% area      of focal narrowing after the takeoff of the major diagonal.  The major      diagonal itself has about 30% to 40% segmental proximal plaquing.  4.  The  circumflex is a codominant vessel.  There is an AV circumflex that      has about a 50% area of ostial narrowing; just after this, there is a      takeoff of a marginal branch.  The marginal has a superior sub-branch      which has about 40% narrowing.  The inferior branch has a 60%  to 70%      area of segmental narrowing in its midportion.  The vessel distally is      relatively large.  5.  The right coronary artery is a codominant vessel with a single PDA      arising from the right.  There is about 40% to 50% proximal narrowing in      the right coronary artery.   CONCLUSION:  1.  Well-preserved left ventricular function.  2.  Multivessel coronary artery disease with moderately severe stenoses in      the circumflex marginal, left anterior descending, and proximal right      coronary  artery.   DISPOSITION:  This is a difficult situation. It is not clear that the  patient's symptoms were cardiac; nonetheless, I am concerned about her  coronary anatomy.  She has morbid obesity combined with tobacco use.  She is  at very high risk for acute coronary syndromes, and aggressive risk factor  reduction is required; the patient absolutely must stop smoking or she is  likely returned with an acute coronary event, and I stated this to her  clearly.      TDS/MEDQ  D:  02/04/2005  T:  02/04/2005  Job:  [id number]   cc:   [address]  [place]  [place] [address]  Fax: [phone]   Jonelle Sidle, M.D. [facility]   [facility]  [address]  [place]  [place] [address]  Fax: [phone]

## 2011-03-26 NOTE — Discharge Summary (Signed)
NAMEMAKINA, SKOW NO.:  [id number]   MEDICAL RECORD NO.:  [id number]          PATIENT TYPE:  INP   LOCATION:  2022                         FACILITY:  [facility]   PHYSICIAN:  Olga Millers, M.D. LHCDATE OF BIRTH:  [birth year]   DATE OF ADMISSION:  02/02/2005  DATE OF DISCHARGE:  02/05/2005                                 DISCHARGE SUMMARY   DISCHARGE DIAGNOSIS:  1. Chest pain with negative cardiac enzymes status post cardiac      catheterization showing multivessel coronary artery disease with      moderately severe stenosis in the circumflex marginal, left anterior      descending, and proximal right coronary artery, with well preserved      left ventricular function which needs aggressive risk factor reduction.  2. Tobacco abuse.  3. Morbid obesity.  4. Chronic excessive use of Goody's Powders.     PAST MEDICAL HISTORY:  Tobacco abuse, degenerative joint disease, chronic  constipation.   FAMILY HISTORY:  Coronary artery disease.   DISPOSITION:  Home with aspirin 325 mg daily, Zocor 20 mg daily, Protonix 40  mg daily, Tylenol for general discomfort, nitroglycerin for chest  discomfort.  Activities:  No driving x 2 days, no lifting over 10 pounds x 1  week.  Diet:  Low fat, low salt, low calorie.  Wound care:  Gently clean  cath site with soap and water, no tub bathing x 1 week.  Special  instructions:  No tobacco use, no Goody's Powder.  Follow up with Dr.  Diona Browner in 2-4 weeks in [place], office to call patient with appointment.  Also, will need LFT and lipid panel drawn in 4-6 weeks, this can be done at  our office, also, or by Dr. Charm Barges.   HOSPITAL COURSE:  This is a 50 year old Caucasian female with family history  of coronary artery disease, morbid obesity, ongoing tobacco abuse, who  presented to [facility] with complaints of chest discomfort  on February 01, 2005.  The patient's initial blood work showed hemoglobin 13,  hematocrit 38,  BUN 5, creatinine 0.8, troponin negative x 3.  Chest x-ray  showing a mildly enlarged heart but normally active lung disease.  The  patient was transferred to [facility] for further workup after  being seen by Dr. Diona Browner at [facility].  The patient was scheduled for  cardiac catheterization on March 30 with results as stated above.  The  patient tolerated the catheterization without complications.  Post cath  stable.  Dr. Riley Kill had a long discussion with the patient about the  results of cardiac catheterization, need for aggressive risk factor  modification, he feels the patient is at high risk for ATS.  The patient  also had nutritional consult and tobacco cessation consult.  The patient was  also found to be slightly hypercholesterolemic with total cholesterol 172,  triglycerides 115, LDL 110, HDL 39.  Dr. Olga Millers in to see the patient on the day of discharge, cath site  stable, patient afebrile, blood pressure stable.  Discharging BUN 9 and  creatinine 1, hemoglobin  12.7.  The patient is discharged home.  Follow up  with Dr. Diona Browner in [place] in 4-6 weeks as stated above.      MB/MEDQ  D:  04/06/2005  T:  04/06/2005  Job:  [id number]   cc:   [address]  [place]  [place] [address]  Fax: [phone]   Jonelle Sidle, M.D. [facility]

## 2011-03-26 NOTE — Assessment & Plan Note (Signed)
[facility]ALTHCARE                            [facility] NOTE   [name], [name]                        MRN:          [id number]  DATE:08/08/2006                            DOB:          January 14, 1961    PRIMARY CARDIOLOGIST:  Dr. Lewayne Bunting   REASON FOR OFFICE VISIT:  Patient called and requested today's visit  following a recent complaint of chest discomfort requiring the use of  nitroglycerin.  She was recently seen here in the clinic on August 21, by  myself, representing a scheduled 1 month follow up after being seen by Dr.  Andee Lineman on July 12.   Patient reports developing some midsternal chest pressure which occurred  approximately 10 days ago.  This occurred at rest and she took 2  nitroglycerin tablets, with some improvement, but without a complete  resolution.  Although she has not had any recurrent symptoms, she states  that she does typically get a pressure sensation in her chest when lying  down.  Therefore, she has been trying to avoid this.  She also reports a  feeling of burning in the midsternum and, notably, is on a proton pump  inhibitor.   Patient reports compliance with her medications, but does continue to smoke.  She denies any exertional chest discomfort but does have chronic dyspnea.   Patient also has been having persistent hypokalemia since recently seen by  [place] in the clinic.  At that time, she was on twice daily Lasix and was taken  off of this and started on Aldactone to help correct her hypokalemia.  She  was then placed on Aldactazide for continued treatment of her high blood  pressure as well.  However, she continues to have hypokalemia with a  potassium level of 2.8 today.  She denies trying to lose weight with use of  diuretics.   Patient also was recently setup for overnight pulse oximetry testing by  [facility].  We reviewed these results today, which are not suggestive of sleep  apnea, but do suggest  underlying intrinsic pulmonary disease.   Electrocardiogram today reveals NSR at 61 bpm with normal axis and no acute  changes.   MEDICATIONS:  1. Aspirin 325 daily.  2. Lipitor 20 daily.  3. Nexium 40 daily.  4. Plavix 75 daily.  5. Toprol XL 50 daily.  6. Aldactazide 25/25 daily.   PHYSICAL EXAMINATION:  Blood pressure 110/64, pulse 60 regular.  Weight 267.  NECK:  Palpable carotid pulses without bruits.  LUNGS:  Diminished breath sounds throughout with faint expiratory wheezes;  no crackles.  HEART:  Regular rate and rhythm (S1, S2) no significant murmurs.  ABDOMEN:  Protuberant, nontender.  EXTREMITIES:  Intact distal pulses without significant edema.  NEUROLOGIC:  Flat affect, but no focal deficit.   IMPRESSION:  1. Recent nonexertional chest discomfort.      a.     No acute changes by current electrocardiogram.  2. Coronary artery disease.      a.     Status post bare metal stenting high grade circumflex artery  April 2007.      b.     Residual 60% proximal left anterior descending disease.      c.     Normal left ventricular function.  3. Persistent hypokalemia.      a.     Despite addition of Aldactone.  4. Chronic obstructive pulmonary disease/ongoing tobacco.  5. Obesity.  6. History of lower extremity edema.  7. Chronic fatigue.   PLAN:  1. Schedule Adenosine stress Cardiolite to exclude significant ischemia as      the etiology of her current symptoms.  2. Schedule formal pulmonary function test/room air ABG for further workup      of underlying coronary artery disease. If abnormal, recommendation is      to have patient referred to Dr. Orson Aloe for further pulmonary      evaluation.  3. Discontinue Aldactazide and resume regular Aldactone 25 mg daily to      correct persistent hypokalemia.  Patient will be given a 1 time dose of      K-Dur 40 mEq today.  She will have a repeat BMET/magnesium level in 2      days.  4. Start Imdur 30 mg daily.  5.  Order 2 view chest x-ray.  6. Return clinic.  Follow up with me in 2 weeks.      ______________________________  Rozell Searing, PA-C    ______________________________  Learta Codding, MD,FACC    GS/MedQ  DD:  08/08/2006  DT:  08/09/2006  Job #:  [id number]

## 2011-03-26 NOTE — Cardiovascular Report (Signed)
NAME:  [name], HEINRICHS NO.:  [id number]   MEDICAL RECORD NO.:  [id number]          PATIENT TYPE:  OIB   LOCATION:  1965                         FACILITY:  [facility]   PHYSICIAN:  Charlies Constable, M.D. [facility] DATE OF BIRTH:  1961-04-26   DATE OF PROCEDURE:  02/08/2006  DATE OF DISCHARGE:  02/08/2006                              CARDIAC CATHETERIZATION   CLINICAL HISTORY:  Ms. Oconnor is 50 years old and has documented  nonobstructive coronary disease on catheterization last summer by Dr.  Samule Ohm.  She recently has developed increasing symptoms of chest tightness  both with exertion and with stress.  She was seen in consultation by Dr.  Andee Lineman and scheduled for evaluation with angiography.  She has had  difficulty with compliance of medicines for cost reasons.   PROCEDURE:  The procedure was performed by the right femoral artery using an  arterial sheath and 4-French preformed coronary catheters.  A femoral wall  artery puncture was performed and Omnipaque contrast was used.  The patient  tolerated the procedure well and left the laboratory in satisfactory  condition.   RESULTS:  The left main coronary artery was free of significant disease.   The left anterior descending artery gave rise to a large diagonal branch and  two septal perforators.  There was 30% ostial stenosis.  There was 60%  stenosis in the proximal and the mid vessel after the first diagonal branch.   The circumflex artery gave rise to a large marginal branch and two  posterolateral branches.  There was 90% stenosis in the mid portion of the  large marginal branch which was new from the prior study.   The right coronary artery is a moderate size vessel that gave rise to a  conus branch, a right ventricular branch, and a posterior descending branch.  The vessel was irregular in its proximal portion but there was no  significant obstruction.   The left ventriculogram performed in the RAO projection showed  good wall  motion with no areas of hypokinesis.  The estimated ejection fraction was  60%.   The aortic pressure was 102/62 with mean of 80 and left ventricular pressure  was 102/5.   CONCLUSION:  Coronary artery disease with 30% ostial and 60% proximal  stenosis in the left anterior descending artery, 90% stenosis in the  circumflex marginal vessel, irregularities in the right coronary artery, and  normal left ventricular function.   RECOMMENDATIONS:  The lesion in the circumflex marginal appears quite tight  and the patient's symptoms are compatible with ischemia.  Will plan  intervention.  In view of her compliance problems, will probably choose a  bare metal stent.           ______________________________  Charlies Constable, M.D. [facility]     BB/MEDQ  D:  02/08/2006  T:  02/09/2006  Job:  [id number]   cc:   Learta Codding, M.D. [facility]  [address]  [address]  [place]  [place] [address]   [facility] Department   Cardiopulmonary Lab

## 2011-03-26 NOTE — Assessment & Plan Note (Signed)
[facility]CARE                            [facility] NOTE   SAVANHA, ISLAND                        MRN:          [id number]  DATE:05/19/2006                            DOB:          [birth year]    HISTORY OF PRESENT ILLNESS:  Patient is a 50 year old female with a history  of documented coronary artery disease, status post PCI with bare-metal stent  to the circumflex coronary artery, February 08, 2006.  She was seen last by Arnette Felts on February 22, 2006.  She apparently has a history of chronic dyspnea.  She stated that over the last several weeks, she has become increasingly  dyspneic.  She was seen by [facility] and was  prescribed Lasix.  The patient, however, ran out of her Lasix.  She did  improve temporarily, but she stated that her symptoms have now worsened.  Particularly over the last couple of days, she has been unable to lie flat  in bed and stated that she could not breathe at night.  She does complain of  some chest tightness but in the setting of dyspnea.   CURRENT MEDICATIONS:  1.  Aspirin 325 mg a day.  2.  Plavix 75 mg a day.  3.  Nexium.  4.  Lipitor 20 mg p.o. nightly.  5.  Toprol XL 50 mg 1 tablet p.o. daily.   PHYSICAL EXAMINATION:  VITAL SIGNS:  Blood pressure 120/60, heart rate 67  beats per minute.  NECK:  Normal carotid upstrokes.  No carotid bruits.  LUNGS:  Diminished breath sounds bilaterally.  HEART:  Regular rate and rhythm.  Normal S1 and S2.  ABDOMEN:  Soft.  EXTREMITIES:  Peripheral pitting edema 2+ with also a component of  nonpitting edema.   PROBLEMS:  1.  Coronary artery disease.      1.  Status post percutaneous coronary intervention and bare-metal stent          to circumflex on February 08, 2006.      2.  Normal left ventricular systolic function, ejection fraction 60%.  2.  Morbid obesity.  3.  Chronic dyspnea secondary to #2.  4.  Hypertensive cardiomyopathy, ejection  fraction 60%.  5.  Hypertension.  6.  Significant renal artery stenosis.  7.  Dyslipidemia.  8.  Ongoing tobacco use.  9.  Noncompliance with medical therapy.   PLAN:  1.  I reiterated to the patient her need for compliance with medical therapy      as well as salt and water restriction.  2.  We have given the patient a prescription for Lasix 40 b.i.d. and will      recheck her BMET and BNP in one week to make sure that she does not get      dehydrated.  3.  I have asked the patient to start an exercise program to reduce her      caloric intake in order to lose some weight.  I do think a large      component of her dyspnea is secondary to her  obesity.                                   Learta Codding, MD, [facility]   GED/MedQ  DD:  05/19/2006  DT:  05/19/2006  Job #:  [id number]

## 2011-03-26 NOTE — Cardiovascular Report (Signed)
NAME:  [name], [name] NO.:  [id number]   MEDICAL RECORD NO.:  [id number]          PATIENT TYPE:  INP   LOCATION:  2022                         FACILITY:  [facility]   PHYSICIAN:  Arturo Morton. Riley Kill, M.D. [facility] OF BIRTH:  September 22, 1961   DATE OF PROCEDURE:  DATE OF DISCHARGE:                              CARDIAC CATHETERIZATION   Audio too short to transcribe (less than 5 seconds)      TDS/MEDQ  D:  02/04/2005  T:  02/04/2005  Job:  [id number]

## 2011-03-26 NOTE — Discharge Summary (Signed)
NAME:  [name], [name] NO.:  [id number]   MEDICAL RECORD NO.:  [id number]          PATIENT TYPE:  OIB   LOCATION:  6529                         FACILITY:  [facility]   PHYSICIAN:  Charlies Constable, M.D. [facility] DATE OF BIRTH:  1961-03-09   DATE OF ADMISSION:  02/08/2006  DATE OF DISCHARGE:  02/09/2006                                 DISCHARGE SUMMARY   DISCHARGE DIAGNOSES:  1.  Chest pain, status post PTCA and bare metal stent to the circumflex.  2.  Morbid obesity.  3.  Chronic dyspnea.  4.  Possible obstructive sleep apnea.  5.  Hypertensive cardiomyopathy with an ejection fraction of 60% at      catheterization.  6.  Hypertension.  7.  Insignificant renal artery stenosis.  8.  Hyperlipidemia.  9.  Ongoing tobacco use.  10. Noncompliance with medication secondary to financial issues.   PROCEDURES:  1.  Cardiac catheterization.  2.  Coronary arteriogram.  3.  Left ventriculogram.  4.  Percutaneous transluminal coronary angiography and bare metal stent to      one vessel.   HOSPITAL COURSE:  [name] is a 50 year old female with history of coronary  artery disease diagnosed on March 30.  Medical therapy was recommended at  that time.  She returned to the office with exertional chest discomfort and  cardiac catheterization was scheduled.   The cardiac catheterization showed a 60% LAD and 90% stenosis in the OM.  This is a new lesion.  It was treated with PTCA and bare metal stent  reducing the stenosis to less than 20%.   [name] has some problems with back and leg pain and was treated on a  p.r.n. basis for this.  The next day, she was evaluated by Dr. Juanda Chance.  It  was felt that she needed Plavix x6 months as well as low-dose metoprolol,  but no other medication changes.  She is to follow up with Dr. Andee Lineman and  she is also to work with the [facility] Department for her  other medications.  She was considered stable for discharge without patient  followup arranged.   ACTIVITY:  Her activity level is to be increased gradually.  She is to call  our office for any problems with the catheterization site.   FOLLOW UP:  She is to follow up with Dr. Andee Lineman in [place] and with [facility] Department as well.   DISCHARGE MEDICATIONS:  1.  Metoprolol 25 mg b.i.d.  2.  Plavix 75 mg a day.  3.  Sublingual nitroglycerin p.r.n.  4.  Aspirin 325 mg a day.  5.  Nexium 40 mg a day.  6.  Lipitor 20 mg a day.  7.  Minitran 0.4 mg b.i.d.      Theodore Demark, P.A. [facility]    ______________________________  Charlies Constable, M.D. [facility]    RB/MEDQ  D:  02/09/2006  T:  02/10/2006  Job:  [id number]   cc:   [facility]   [facility] Department

## 2011-03-26 NOTE — Cardiovascular Report (Signed)
NAMEAMERE, IOTT NO.:  [id number]   MEDICAL RECORD NO.:  [id number]          PATIENT TYPE:  INP   LOCATION:  2038                         FACILITY:  [facility]   PHYSICIAN:  Salvadore Farber, M.D. LHCDATE OF BIRTH:  1961-01-01   DATE OF PROCEDURE:  03/31/2005  DATE OF DISCHARGE:                              CARDIAC CATHETERIZATION   PROCEDURES:  1.  Left heart catheterization.  2.  Left ventriculography.  3.  Selective bilateral renal angiography.  4.  Coronary angiography.  5.  Pressure wire interrogation of the left anterior descending.   INDICATIONS:  Ms. Dearman is a morbidly obese 50 year old lady who presents  with several episodes of severe dyspnea.  She underwent cardiac  catheterization in late March of this year by Dr. Riley Kill.  He found a  moderate stenosis of the mid LAD.  It was not felt that this was the culprit  for her dyspnea.  She now represents with similar symptoms. We are asked to  repeat the coronary angiography and consider further interrogation of the  LAD lesion.   PROCEDURAL TECHNIQUE:  Informed consent was obtained.  Under 1% lidocaine  local anesthesia, a 6-French sheath was placed in the right common femoral  artery using the modified Seldinger technique.  Additional heparin was given  to achieve and maintain an ACT of greater than 275 seconds.  Coronary  angiography was performed using JR4 catheter for the native right and CLS  3.5 guide for the native left system.  Left heart catheterization and  ventriculography were performed using pigtail catheter.  The pigtail was  then pulled back in the suprarenal abdominal aorta.  Abdominal aortography  was performed by power injection.  This demonstrated at least mild stenosis  at the ostium of the dominant left renal artery.  Selective renal  angiography was then performed using JR4 catheter for two of the three  vessels on the left and the single vessel on the right.  A small  accessory  branch on the left was not selectively engaged but was seen to be normal on  the aortography.   I then returned attention to the left coronary.  Via the CLS guide, a  pressure wire was advanced beyond the lesion in the mid LAD.  FFR was  measured after the administration of 40 and then 60 mcg of intracoronary  adenosine.  FFR measured 0.89 and then 0.88.  Repeat angiography  demonstrated no change in the vessel.  Catheter was then removed and patient  transferred to the holding room in stable condition.  Sheaths would be  removed when the ACT was less than 175 seconds.   COMPLICATIONS:  None.   FINDINGS:  1.  LV: 153/19/30.  EF 60% without regional wall motion abnormality.  2.  No aortic stenosis or mitral regurgitation.  3.  Left main: Angiographically normal.  4.  LAD:  Moderate size vessel giving rise to a single large diagonal.  The      mid LAD has a 60% stenosis.  There is an FFR of 0.88.  This is well  above the threshold of 0.75 below which it correlates with physiologic      significance.  5.  Circumflex:  Moderate size vessel giving rise to two obtuse marginals.      There is a 40% stenosis prior to the takeoff of the most distal      marginal.  6.  RCA:  Small though dominant vessel.  There is a 20% stenosis proximally.  7.  Abdominal aortography:  No significant disease of the infrarenal      abdominal aorta.  Multiple coils are noted in the pelvis.  8.  Renal arteries:  There are three left renal arteries.  The dominant one      is the inferior most.  It has a 30% ostial stenosis.  The other two are      normal.  There is a single right renal artery.  This is angiographically      normal.   IMPRESSION AND PLAN:  1.  Moderate left anterior descending artery stenosis which is not      obstructive by either angiographic or FFR criteria.  2.  Insignificant renal artery stenosis.  3.  Elevated left ventricular end-diastolic pressure.  4.  Normal left  ventricular systolic function.   I think Ms. Trauger's shortness of breath is due to a combination of her  obesity and probably hypertensive cardiomyopathy.  I think target for  therapy should be her left ventricular end-diastolic pressure of 30.  I have  recommended initiation of a diuretic and careful attention to her  hypertension.  Would consider initiation of ACE inhibitor.      WED/MEDQ  D:  03/31/2005  T:  03/31/2005  Job:  [id number]   cc:   Jonelle Sidle, M.D. [facility]   Samuel Jester, M.D.

## 2011-03-26 NOTE — Discharge Summary (Signed)
NAME:  [name], [name] NO.:  [id number]   MEDICAL RECORD NO.:  [id number]          PATIENT TYPE:  INP   LOCATION:  2038                         FACILITY:  [facility]   PHYSICIAN:  Willa Rough, M.D.     DATE OF BIRTH:  [birth year]   DATE OF ADMISSION:  03/30/2005  DATE OF DISCHARGE:  04/01/2005                                 DISCHARGE SUMMARY   PROCEDURES:  Cardiac catheterization on Mar 28, 2005.   REASON FOR ADMISSION:  Ms. Zylka is a 50 year old female, with a history of  non-obstructive coronary artery disease by previous cardiac catheterization,  with several cardiac risk factors, who initially presented to [facility] with complaints of chest pain.  She was referred to Dr. Willa Rough for further evaluation.  Recommendation was to transfer to [facility]  for a re-look cardiac catheterization with IVUS.  Please refer to Dr. Henrietta Hoover  consultation note for full details.   LABORATORY DATA:  Hemoglobin 11.6, hematocrit 34 (MCV of 95), platelets 122,  at discharge.  Potassium 3.6, BUN 6, creatinine 0.9, at discharge.  TSH  3.19.  Lipid profile:  Total cholesterol 153, triglycerides 136, HDL 36, LDL  90.  Serial cardiac enzymes normal (two sets).   Admission chest x-ray:  Bibasilar atelectasis.   HOSPITAL COURSE FOLLOWING TRANSFER FROM [facility]  The patient was  maintained on a medication regimen including aspirin, Lopressor, and  intravenous heparin.  Serial cardiac markers were within normal limits.   The patient was prepared for cardiac catheterization, performed the  following day by Dr. Randa Evens, (please see the report for full  details) revealing a 60% left mid left anterior descending with  nonobstructive circumflex and slight coronary artery disease.  Left  ventricular function was normal with no wall motion abnormalities or  evidence of valvular disease.  Dr. Samule Ohm had assessed the renal arteries  and found no significant  atherosclerosis.   Dr. Samule Ohm concluded that the LAD lesion was nonobstructive and that the  dyspnea was most likely due to the __________ and hypertensive  cardiomyopathy.  He recommended the addition of a diuretic.  The patient was  placed on low-dose hydrochlorothiazide following the procedure.   The patient was scheduled to be discharged the following morning by Dr.  Willa Rough, __________.  He had no other complications of the right groin  incision site.   DISCHARGE MEDICATIONS:  1.  Coated aspirin 81 mg daily.  2.  Hydrochlorothiazide 12.5 mg daily (new).  3.  Crestor 10 mg daily.  4.  Protonix 40 mg daily.  5.  Nitroglycerin 0.4 mg as directed.   INSTRUCTIONS:  1.  No heavy lifting/driving x2 days.  2.  Low fat/cholesterol diet.  3.  Call the office if there is any swelling/bleeding of the groin.   FOLLOWUP:  1.  The patient is instructed to follow up with Dr. Samuel Jester in one to      two weeks.  2.  The patient is instructed to follow up with Dr. Simona Huh in three  months at [facility] in [place].  The patient is instructed to call      for an appointment.   DISCHARGE DIAGNOSES:  1.  Dyspnea.      1.  Non-cardiac etiology.  2.  Non-obstructive coronary artery disease.      1.  Moderate left anterior descending disease by cardiac catheterization          on Mar 30, 2005.      2.  Normal left ventricular function.  3.  Obesity.  4.  Hyperlipidemia.  5.  Hypertension.  6.  Tobacco.  7.  Gastroesophageal reflux disease.      GS/MEDQ  D:  04/01/2005  T:  04/01/2005  Job:  [id number]   cc:   [address]  [place]  [place] [address]  Fax: [phone]   [facility]  [address]  [place], [place] [address]

## 2011-03-26 NOTE — Assessment & Plan Note (Signed)
[facility]                          [facility] OFFICE NOTE   [name], Stacey                        MRN:          [id number]  DATE:10/04/2006                            DOB:          1960/12/26    PRIMARY CARDIOLOGIST:  Learta Codding, MD,FACC   REASON FOR OFFICE VISIT:  Scheduled followup. Please refer to my office  note of October 1 for full details.   At that time, the patient was scheduled for several diagnostic studies  as well as blood work. She was also instructed to start Imdur. However,  she only presented for the Adenosine stress Cardiolite, which was  essentially negative for ischemia with calculated ejection fraction of  63%. She says that the formal PFTs were never scheduled. She does not  recall being instructed to start Imdur and she never returned for  followup blood work.   The patient presents clearly affected by significant stressors at home.  She and her husband are unemployed. She is currently going through the  process of continuing Medicaid and yet also are unable to come up with  the few dollars that are needed in order to fill a prescription for her  medications.   The patient also continues to smoke, as well.   The patient denies exertional chest discomfort, but has chronic dyspnea.  Reports that she has to sleep on several pillows and reports that she  does often times wake up short of breath at night.   CURRENT MEDICATIONS:  The patient is only taking full dose aspirin and  Toprol 50 daily. She states that she was contacted by our office and  instructed to stop taking her diuretic.   PHYSICAL EXAMINATION:  Blood pressure is 160/88, pulse is 60, regular,  weight is 267.  GENERAL: A 50 year old female, obese, with some somnolent appearance.  HEENT: Normocephalic, atraumatic.  NECK: Palpable bilateral carotid pulses without bruits. Marland Kitchen  LUNGS:  Diminished breath sounds throughout.  HEART: Regular rate and  rhythm (S1, S2). No significant murmurs.  ABDOMEN: Protuberant, nontender.  EXTREMITIES: Palpable distal pulses with trace pedal edema.   IMPRESSION:  1. Coronary artery disease.      a.     Status post bare metal stenting high-grade circumflex April       of 2007.      b.     Residual 60% proximal left anterior descending disease.      c.     Normal left ventricular function.      d.     Negative Adenosine stress Cardiolite; ejection fraction 63%       October of 2007.  2. Hypertension.  3. Persistent hypokalemia.      a.     Previously on aldactone, since discontinued.  4. Chronic obstructive pulmonary disease/ongoing tobacco.  5. Morbid obesity.  6. Chronic lower extremity edema.  7. Chronic fatigue.  8. Medication noncompliance.   PLAN:  1. Simplify medication regimen secondary to severe financial      constraints, despite the fact that the patient is on Medicaid, she  is unable to find sufficient funds in order to fill her      prescriptions.  2. Decrease aspirin to 81 daily. Discontinue Toprol given the absence      of any documented myocardial infarction and known normal left      ventricular function as well as to try to minimize costs. Resume      Lipitor at 20 daily for continued aggressive lipid management.  3. Resume Aldactazide 25/25 daily, which the patient had been on in      the past, both to help correct her persistent hypokalemia as well      as for treatment of her chronic lower extremity edema. However,      this will require compliance on her part with periodic surveillance      blood work.  4. Check a baseline metabolic profile today and adjust her medication      regimen according, pending review of her potassium level.  5. Check fasting lipid/liver profile.  6. Prescribe Chantix for smoking cessation; the patient appears quite      motivated to try to stop smoking.  7. Schedule return clinic follow up with myself and Dr. Andee Lineman in six       months.      Stacey Serpe, PA-C  Electronically Signed      Learta Codding, MD,FACC  Electronically Signed   GS/MedQ  DD: 10/04/2006  DT: 10/04/2006  Job #: [id number]

## 2011-03-26 NOTE — Cardiovascular Report (Signed)
NAME:  CHARDAI, GANGEMI NO.:  [id number]   MEDICAL RECORD NO.:  [id number]          PATIENT TYPE:  OIB   LOCATION:  6529                         FACILITY:  [facility]   PHYSICIAN:  Charlies Constable, M.D. [facility] DATE OF BIRTH:  August 31, 1961   DATE OF PROCEDURE:  02/08/2006  DATE OF DISCHARGE:  02/09/2006                              CARDIAC CATHETERIZATION   PROCEDURES:  Percutaneous coronary intervention.   CLINICAL HISTORY:  Ms. Arena is 50 years old and has had previous history of  nonobstructive coronary artery disease and recently developed increased  symptoms of angina. She was scheduled for outpatient catheterization by Dr.  Andee Lineman and this was done by myself earlier today which showed a 90% lesion  in a large circumflex marginal branch which was new and brought back to day  for intervention.  She has not been very compliant with medications related  to cost issues, and we thought a bare metal stent would probably be the best  option.   DESCRIPTION OF PROCEDURE:  The procedure was performed via the right femoral  artery. The patient was given Angiomax bolus and infusion and was given a  600 mg load of Plavix.  We used a CLS 3.5 6-French guiding catheter with  side holes.  We crossed the lesion with Prowater wire without difficulty.  We predilated with a 3 x 15 mm Maverick, performing 2 inflations up to 8  atmospheres for 30 seconds.  We then deployed a 3 x 18 mm Mini Vision stent.  There was some spasm in the vessel just at the proximal edge of the stent  just before deployment, and we pulled the proximal edge back a little bit  before deploying the stent.  Unfortunately, this left a small area of  disease distally uncovered by the stent.  This did not severely compromise  the lumen.  We post dilated with a 3.25 to 15 mm Quantum Maverick, 2  inflations up to 15 atmospheres for 30 seconds.  Final diagnostic studies  then performed through the guiding catheter.   RESULT:   Initially stenosis was estimated at 90%.  Following stenting, this  improved to 10 to 15% with the narrowing located at the distal edge of the  stent.   CONCLUSION:  Successful percutaneous coronary intervention of the lesion in  the circumflex marginal vessel using a Vision bare metal stent with  improvement in narrowing from 90% to less than 20%.   DISPOSITION:  The patient was taken to the holding area for observation.           ______________________________  Charlies Constable, M.D. [facility]     BB/MEDQ  D:  02/08/2006  T:  02/09/2006  Job:  [id number]   cc:   Learta Codding, M.D. [facility]  [address]  [address]  [place]  [place] [address]   Cardiopulmonary Lab

## 2011-06-04 ENCOUNTER — Encounter: Payer: Self-pay | Admitting: *Deleted

## 2011-06-04 ENCOUNTER — Encounter: Payer: Self-pay | Admitting: Cardiology

## 2011-06-04 ENCOUNTER — Ambulatory Visit (INDEPENDENT_AMBULATORY_CARE_PROVIDER_SITE_OTHER): Payer: Self-pay | Admitting: Cardiology

## 2011-06-04 VITALS — BP 118/81 | HR 93 | Resp 18 | Ht 67.0 in | Wt 297.8 lb

## 2011-06-04 DIAGNOSIS — R0989 Other specified symptoms and signs involving the circulatory and respiratory systems: Secondary | ICD-10-CM

## 2011-06-04 DIAGNOSIS — R600 Localized edema: Secondary | ICD-10-CM

## 2011-06-04 DIAGNOSIS — I509 Heart failure, unspecified: Secondary | ICD-10-CM

## 2011-06-04 DIAGNOSIS — I2510 Atherosclerotic heart disease of native coronary artery without angina pectoris: Secondary | ICD-10-CM

## 2011-06-04 DIAGNOSIS — R609 Edema, unspecified: Secondary | ICD-10-CM

## 2011-06-04 DIAGNOSIS — R0609 Other forms of dyspnea: Secondary | ICD-10-CM

## 2011-06-04 DIAGNOSIS — F17200 Nicotine dependence, unspecified, uncomplicated: Secondary | ICD-10-CM

## 2011-06-04 DIAGNOSIS — R0600 Dyspnea, unspecified: Secondary | ICD-10-CM

## 2011-06-04 DIAGNOSIS — R0602 Shortness of breath: Secondary | ICD-10-CM

## 2011-06-04 MED ORDER — FUROSEMIDE 20 MG PO TABS: 20.0000 mg | ORAL_TABLET | Freq: Every day | ORAL | Status: DC

## 2011-06-04 MED ORDER — SPIRONOLACTONE 25 MG PO TABS: 25.0000 mg | ORAL_TABLET | Freq: Every day | ORAL | Status: DC

## 2011-06-04 NOTE — Patient Instructions (Addendum)
   Right heart cath - see instruction sheet given  Sleep study - will send referral to Dr. Josephina Shih [facility])   Labs - can do on Monday, 8/30 at the [facility] for pre-cath labs - orders given today  Lasix 20mg  daily  Spironolactone 25mg  daily Routine follow up in 3 months. Follow up will be given at time of discharge for follow up cath visit

## 2011-06-06 DIAGNOSIS — R0600 Dyspnea, unspecified: Secondary | ICD-10-CM | POA: Insufficient documentation

## 2011-06-06 NOTE — Assessment & Plan Note (Signed)
I counseled the patient regarding her tobacco use. I told her that this is contributing to her dyspnea.

## 2011-06-06 NOTE — Progress Notes (Signed)
HPI  The patient is a 50 year old female with a history of atypical chest pain and prior admissions for rule out MI. She does have a known history of coronary artery disease with stent placement but otherwise looked drug of coronary artery disease by catheterization in 2000 and then with a widely patent stent in normal LV function. She was admitted in January rule out for myocardial infarction and 12 other syndromes were atypical and possibly related to reflux. She had another admission in July at [facility] again with atypical chest pain but now with progressive dyspnea and new onset lower extremity edema. She states that since her admission her shortness of breath has worsened. She is demonstrating symptoms of right heart failure with significant lower extremity edema. She stated that she took a couple of fluid pills from a family member which did help her edema but can cause significant cramping in her legs. She also reports some heavy pressure in the chest upon walking, but more consistent with shortness of breath and no definite typical angina. Of note is that the patient has history of mild thrombocytopenia of unclear etiology.  During her last admission 2 weeks ago at [facility] she did have a myocardial perfusion study done which showed normal LV perfusion and ejection fraction of 62%.  Her husband reports loud snoring at night. She states that she sleeps on several pillows he still feels at times during the overnight that she can't catch her breath. The husband also has noted apneic spells. The patient has not undergone any type of evaluation for sleep apnea.  Allergies  Allergen Reactions  . Morphine And Related Nausea Only    Makes her sick on the stomach    Current Outpatient Prescriptions on File Prior to Visit  Medication Sig Dispense Refill  . aspirin 325 MG tablet Take 325 mg by mouth daily.        . cyclobenzaprine (FLEXERIL) 10 MG tablet Take 10 mg by mouth 2 (two)  times daily as needed.        . naproxen (NAPROSYN) 500 MG tablet Take 500 mg by mouth 2 (two) times daily with a meal.          Past Medical History  Diagnosis Date  . GERD (gastroesophageal reflux disease)   . Hyperlipidemia   . Noncompliance   . CAD (coronary artery disease)     post PTCA with stent placement  . Morbid obesity     Past Surgical History  Procedure Date  . Cholecystectomy   . Abdominal hysterectomy   . Ptca     WITH STENT PLACEMENT    Family History  Problem Relation Age of Onset  . Coronary artery disease Father     History   Social History  . Marital Status: Married    Spouse Name: N/A    Number of Children: N/A  . Years of Education: N/A   Occupational History  . Not on file.   Social History Main Topics  . Smoking status: Current Everyday Smoker -- 0.3 packs/day for 25 years    Types: Cigarettes  . Smokeless tobacco: Not on file  . Alcohol Use: No  . Drug Use: No  . Sexually Active: Not on file   Other Topics Concern  . Not on file   Social History Narrative  . No narrative on file   OZH:[id number] positives as outlined above. The remainder of the 18  point review of systems is negative   PHYSICAL EXAM BP  118/81  Pulse 93  Resp 18  Ht 5\' 7"  (1.702 m)  Wt 297 lb 12.8 oz (135.081 kg)  BMI 46.64 kg/m2  SpO2 97%  General: Overweight white female mildly short of breath. Head: Normocephalic and atraumatic Eyes:PERRLA/EOMI intact, conjunctiva and lids normal Ears: No deformity or lesions Mouth:normal dentition, normal posterior pharynx Neck: Supple, no JVD.  No masses, thyromegaly or abnormal cervical nodes Lungs: Normal breath sounds bilaterally without wheezing.  Normal percussion Cardiac: regular rate and rhythm with normal S1 and S2, no S3 or S4.  PMI is normal.  No pathological murmurs Abdomen: Normal bowel sounds, abdomen is soft and nontender without masses, organomegaly or hernias noted.  No hepatosplenomegaly MSK: Back  normal, normal gait muscle strength and tone normal Vascular: Pulse is normal in all 4 extremities Extremities: 3+ peripheral pitting edema Neurologic: Alert and oriented x 3 Skin: Intact without lesions or rashes Lymphatics: No significant adenopathy Psychologic: Normal affect   ECG: EKG from 7/23 shows normal sinus rhythm and nonspecific ST-T wave changes  ASSESSMENT AND PLAN

## 2011-06-06 NOTE — Assessment & Plan Note (Signed)
Likely secondary to right heart failure. I've given the patient prescription of Lasix 20 mg daily in addition with spironolactone 25 mg by mouth daily. Electrolyte panel will be obtained including a magnesium level given her history of cramps in the lower extremity after she took 2 doses of Lasix.

## 2011-06-06 NOTE — Assessment & Plan Note (Signed)
The patient's cardinal symptom is dyspnea and lower extremity edema. She appears to have right heart failure symptoms. Prior echocardiogram was not conclusive for significant pulmonary hypertension. However historically the patient does appear to have sleep apnea and will be referred for sleep study. She'll also be further evaluated for other possibilities if indeed she has pulmonary arterial hypertension or pulmonary venous hypertension. I have discussed with the patient indication for right heart catheterization to evaluate her PA pressures. She is in agreement with this. She does have mild thrombocytopenia but this should not be a contraindication. I discussed risks and benefits of the procedure with the patient and she is willing to proceed. She will also be scheduled for formal sleep study in the interim. Pending the results of the right heart catheterization a sleep study further diagnostic studies may be needed.

## 2011-06-06 NOTE — Assessment & Plan Note (Signed)
Patient does have some atypical symptoms. She has now ruled out several times this year. 2 admissions. Her last admission several weeks ago a Cardiolite study was performed which showed no ischemia and was entirely within normal limits.

## 2011-06-07 ENCOUNTER — Telehealth: Payer: Self-pay | Admitting: *Deleted

## 2011-06-07 ENCOUNTER — Other Ambulatory Visit: Payer: Self-pay | Admitting: *Deleted

## 2011-06-07 DIAGNOSIS — G4733 Obstructive sleep apnea (adult) (pediatric): Secondary | ICD-10-CM

## 2011-06-07 LAB — PROTIME-INR

## 2011-06-07 NOTE — Telephone Encounter (Signed)
Self pay per office visit on 06/04/11

## 2011-06-07 NOTE — Telephone Encounter (Signed)
Right heart cath scheduled for 8/3 at 11:30 with Dr. Gala Romney - [facility] cath lab.

## 2011-06-11 ENCOUNTER — Inpatient Hospital Stay (HOSPITAL_BASED_OUTPATIENT_CLINIC_OR_DEPARTMENT_OTHER)
Admission: RE | Admit: 2011-06-11 | Discharge: 2011-06-11 | Disposition: A | Discharge status: Home / Self Care | Payer: Self-pay | Source: Ambulatory Visit | Attending: Internal Medicine | Admitting: Internal Medicine

## 2011-06-11 DIAGNOSIS — R0602 Shortness of breath: Secondary | ICD-10-CM

## 2011-06-11 DIAGNOSIS — R609 Edema, unspecified: Secondary | ICD-10-CM | POA: Insufficient documentation

## 2011-06-11 DIAGNOSIS — R0609 Other forms of dyspnea: Secondary | ICD-10-CM | POA: Insufficient documentation

## 2011-06-11 DIAGNOSIS — I2510 Atherosclerotic heart disease of native coronary artery without angina pectoris: Secondary | ICD-10-CM | POA: Insufficient documentation

## 2011-06-11 DIAGNOSIS — R0989 Other specified symptoms and signs involving the circulatory and respiratory systems: Secondary | ICD-10-CM | POA: Insufficient documentation

## 2011-06-11 LAB — POCT I-STAT 3, ART BLOOD GAS (G3+)
Acid-base deficit: 4 mmol/L — ABNORMAL HIGH (ref 0.0–2.0)
Bicarbonate: 21.9 mEq/L (ref 20.0–24.0)
O2 Saturation: 94 %
TCO2: 23 mmol/L (ref 0–100)
pCO2 arterial: 40.9 mmHg (ref 35.0–45.0)
pH, Arterial: 7.337 — ABNORMAL LOW (ref 7.350–7.400)
pO2, Arterial: 77 mmHg — ABNORMAL LOW (ref 80.0–100.0)

## 2011-06-11 LAB — POCT I-STAT 3, VENOUS BLOOD GAS (G3P V)
Bicarbonate: 25.3 mEq/L — ABNORMAL HIGH (ref 20.0–24.0)
Bicarbonate: 25.4 mEq/L — ABNORMAL HIGH (ref 20.0–24.0)
O2 Saturation: 65 %
O2 Saturation: 65 %
TCO2: 27 mmol/L (ref 0–100)
TCO2: 27 mmol/L (ref 0–100)
pCO2, Ven: 41.3 mmHg — ABNORMAL LOW (ref 45.0–50.0)
pCO2, Ven: 42.9 mmHg — ABNORMAL LOW (ref 45.0–50.0)
pH, Ven: 7.379 — ABNORMAL HIGH (ref 7.250–7.300)
pH, Ven: 7.396 — ABNORMAL HIGH (ref 7.250–7.300)
pO2, Ven: 34 mmHg (ref 30.0–45.0)
pO2, Ven: 35 mmHg (ref 30.0–45.0)

## 2011-07-06 ENCOUNTER — Encounter: Payer: Self-pay | Admitting: Cardiology

## 2011-07-06 ENCOUNTER — Ambulatory Visit (INDEPENDENT_AMBULATORY_CARE_PROVIDER_SITE_OTHER): Payer: Self-pay | Admitting: Cardiology

## 2011-07-06 VITALS — BP 115/76 | HR 79 | Ht 62.0 in | Wt 302.0 lb

## 2011-07-06 DIAGNOSIS — R0989 Other specified symptoms and signs involving the circulatory and respiratory systems: Secondary | ICD-10-CM

## 2011-07-06 DIAGNOSIS — G4733 Obstructive sleep apnea (adult) (pediatric): Secondary | ICD-10-CM | POA: Insufficient documentation

## 2011-07-06 DIAGNOSIS — K219 Gastro-esophageal reflux disease without esophagitis: Secondary | ICD-10-CM

## 2011-07-06 DIAGNOSIS — R0609 Other forms of dyspnea: Secondary | ICD-10-CM

## 2011-07-06 DIAGNOSIS — R0600 Dyspnea, unspecified: Secondary | ICD-10-CM

## 2011-07-06 DIAGNOSIS — G4730 Sleep apnea, unspecified: Secondary | ICD-10-CM

## 2011-07-06 DIAGNOSIS — I2510 Atherosclerotic heart disease of native coronary artery without angina pectoris: Secondary | ICD-10-CM

## 2011-07-06 MED ORDER — FUROSEMIDE 20 MG PO TABS: 20.0000 mg | ORAL_TABLET | ORAL | Status: DC | PRN

## 2011-07-06 MED ORDER — RANITIDINE HCL 150 MG PO TABS: 150.0000 mg | ORAL_TABLET | Freq: Two times a day (BID) | ORAL | Status: DC

## 2011-07-06 MED ORDER — HYOSCYAMINE SULFATE 0.25 MG PO TBDP: 1.0000 | ORAL_TABLET | Freq: Three times a day (TID) | ORAL | Status: DC | PRN

## 2011-07-06 MED ORDER — SPIRONOLACTONE 25 MG PO TABS: 25.0000 mg | ORAL_TABLET | ORAL | Status: DC | PRN

## 2011-07-06 MED ORDER — POLYETHYLENE GLYCOL 3350 17 GM/SCOOP PO POWD: 17.0000 g | Freq: Every day | ORAL | Status: AC

## 2011-07-06 NOTE — Assessment & Plan Note (Signed)
No recurrent chest pain. Negative Cardiolite study earlier this year.

## 2011-07-06 NOTE — Assessment & Plan Note (Signed)
Patient has been scheduled for sleep study.

## 2011-07-06 NOTE — Assessment & Plan Note (Signed)
No evidence of pulmonary hypertension

## 2011-07-06 NOTE — Cardiovascular Report (Signed)
  NAME:  [name], [name] NO.:  [id number]  MEDICAL RECORD NO.:  [id number]  LOCATION:                                 FACILITY:  PHYSICIAN:  Stacey Buckles. Jurline Folger, MDDATE OF BIRTH:  06-30-1961  DATE OF PROCEDURE:  06/11/2011 DATE OF DISCHARGE:                           CARDIAC CATHETERIZATION   PRIMARY CARDIOLOGIST:  Learta Codding, MD,FACC  PATIENT IDENTIFICATION:  [name] is 50 year old woman with history of morbid obesity and coronary artery disease.  She has been having lower extremity edema and progressive dyspnea on exertion concerning for right heart failure.  She had a recent stress test which was normal.  She also has COPD.  She is referred by Dr. Andee Lineman for right heart cath.  PROCEDURES PERFORMED:  Right heart cath.  DESCRIPTION OF PROCEDURE:  The risks and indication were explained. Consent was signed and placed on the chart.  Right groin area was prepped and draped in routine sterile fashion, anesthetized with 1% local lidocaine.  A 7-French venous sheath was placed in the right femoral vein using modified Seldinger technique.  Standard Swan-Ganz catheter was used.  There are no apparent complications.  HEMODYNAMICS:  Right atrial pressure mean of 3, RV pressure 28/0 with an EDP of 7.  PA pressure 24/13 with a mean of 17.  Pulmonary capillary wedge pressure mean of 3.  Fick cardiac output 5.8.  Cardiac index 2.4. Pulmonary vascular resistance 2.4 Woods units.  SATURATIONS:  Femoral artery saturation 94% on room air.  Pulmonary artery saturation 65% and 68%.  ASSESSMENT: 1. Normal right-sided pressures. 2. Normal wedge pressure.  PLAN/DISCUSSION:  Based on her hemodynamics, I suspect [name]'s symptoms are due to her COPD, sleep apnea, and obesity.  She has been scheduled for sleep study.  She will follow up with Dr. Andee Lineman.     Stacey Buckles. Ammar Moffatt, MD    DRB/MEDQ  D:  06/11/2011  T:  06/11/2011  Job:  [id number]  cc:   Learta Codding,  MD,FACC  Electronically Signed by Arvilla Meres MD on 07/06/2011 05:22:57 PM

## 2011-07-06 NOTE — Patient Instructions (Signed)
Your physician wants you to follow-up in: 6 months. You will receive a reminder letter in the mail one-two months in advance. If you don't receive a letter, please call our office to schedule the follow-up appointment. Miralax 17 grams daily over the counter. Zantac (ranitidine) 150 mg two times a day over the counter. Hyoscyamine 0.25 mg 1 tablet under tongue every 8 hours as needed for GI spasm/cramps Hold Lasix (furosemide)/Aldactone (spironolactone) for 1 week and then resume as needed based on weight.

## 2011-07-06 NOTE — Progress Notes (Signed)
HPI  The patient is a 50 year old female with a history of atypical chest pain and prior admissions for rule out myocardial infarction. She does have coronary artery disease with prior stent placement. Several years ago she had a repeat catheterization with patent stents. She also had several weeks ago a myocardial perfusion study done which showed normal LV perfusion and ejection fraction of 62%. The patient does have symptoms of sleep apnea and is scheduled in the next couple of weeks for sleep study, hopefully after she obtains her Medicaid. She reports some symptoms of depression and anxiety, anger and agoraphobia in the past she has been on antidepressant therapy but has been unable to afford this. She also complains of significant reflux as well as cramps typically he left the right lower quadrant of the abdomen. She severely constipated and only makes one bowel movement a week. She had a right heart catheterization done recently to rule out pulmonary hypertension. Right atrial pressure was 3, right ventricular pressure 28/7 with a PA pressure of 24/13 and a mean of 17 mm of mercury wedge pressure was 10 mmHg Fick cardiac output was 5.8 with a PVR of 2.4 with units. PA saturation was 68%. The study was essentially within normal limits  Allergies  Allergen Reactions  . Morphine And Related Nausea Only    Makes her sick on the stomach    Current Outpatient Prescriptions on File Prior to Visit  Medication Sig Dispense Refill  . aspirin 325 MG tablet Take 325 mg by mouth daily.          Past Medical History  Diagnosis Date  . GERD (gastroesophageal reflux disease)   . Hyperlipidemia   . Noncompliance   . CAD (coronary artery disease)     post PTCA with stent placement  . Morbid obesity     Past Surgical History  Procedure Date  . Cholecystectomy   . Abdominal hysterectomy   . Ptca     WITH STENT PLACEMENT    Family History  Problem Relation Age of Onset  . Coronary artery  disease Father     History   Social History  . Marital Status: Married    Spouse Name: N/A    Number of Children: N/A  . Years of Education: N/A   Occupational History  . Not on file.   Social History Main Topics  . Smoking status: Current Everyday Smoker -- 1.0 packs/day for 35 years    Types: Cigarettes  . Smokeless tobacco: Never Used  . Alcohol Use: No  . Drug Use: No  . Sexually Active: Not on file   Other Topics Concern  . Not on file   Social History Narrative  . No narrative on file   [id number] positives as outlined above. The remainder of the 18  point review of systems is negative  PHYSICAL EXAM BP 115/76  Pulse 79  Ht 5\' 2"  (1.575 m)  Wt 302 lb (136.986 kg)  BMI 55.24 kg/m2  General: Morbidly obese Head: Normocephalic and atraumatic Eyes:PERRLA/EOMI intact, conjunctiva and lids normal Ears: No deformity or lesions Mouth:normal dentition, normal posterior pharynx Neck: Supple, no JVD.  No masses, thyromegaly or abnormal cervical nodes Lungs: Normal breath sounds bilaterally without wheezing.  Normal percussion Cardiac: regular rate and rhythm with normal S1 and S2, no S3 or S4.  PMI is normal.  No pathological murmurs Abdomen: Normal bowel sounds, abdomen is soft and nontender without masses, organomegaly or hernias noted.  No hepatosplenomegaly MSK: Back normal,  normal gait muscle strength and tone normal Vascular: Pulse is normal in all 4 extremities Extremities: No peripheral pitting edema Neurologic: Alert and oriented x 3 Skin: Intact without lesions or rashes Lymphatics: No significant adenopathy Psychologic: Normal affect    ECG: Not available  ASSESSMENT AND PLAN

## 2011-07-06 NOTE — Assessment & Plan Note (Signed)
Constipation: Start MiraLAX Abdominal cramps likely secondary to IBS symptoms and heavy coffee consumption: Reduce coffee consumption and have given the patient prescription for hyosciamine. She also has reflux symptoms I asked her to take Zantac 150 mg twice a day.

## 2011-08-19 LAB — POCT I-STAT 3, VENOUS BLOOD GAS (G3P V)
Acid-base deficit: 2
Acid-base deficit: 2
Bicarbonate: 23.2
Bicarbonate: 23.4
O2 Saturation: 62
O2 Saturation: 94
Operator id: 274661
Operator id: 296371
TCO2: 24
TCO2: 25
pCO2, Ven: 38.3 — ABNORMAL LOW
pCO2, Ven: 40.5 — ABNORMAL LOW
pH, Ven: 7.37 — ABNORMAL HIGH
pH, Ven: 7.39 — ABNORMAL HIGH
pO2, Ven: 33
pO2, Ven: 71 — ABNORMAL HIGH

## 2011-08-19 LAB — CBC
HCT: 34.3 — ABNORMAL LOW
HCT: 36.4
Hemoglobin: 11.8 — ABNORMAL LOW
Hemoglobin: 12.4
MCHC: 34.1
MCHC: 34.4
MCV: 94
MCV: 95.8
Platelets: 107 — ABNORMAL LOW
Platelets: 125 — ABNORMAL LOW
RBC: 3.65 — ABNORMAL LOW
RBC: 3.8 — ABNORMAL LOW
RDW: 14.5 — ABNORMAL HIGH
RDW: 14.7 — ABNORMAL HIGH
WBC: 6.4
WBC: 7.4

## 2011-08-19 LAB — BASIC METABOLIC PANEL
BUN: 12
BUN: 9
CO2: 24
CO2: 28
Calcium: 8.4
Calcium: 8.9
Chloride: 104
Chloride: 108
Creatinine, Ser: 0.8
Creatinine, Ser: 0.92
GFR calc Af Amer: >60
GFR calc Af Amer: >60
GFR calc non Af Amer: >60
GFR calc non Af Amer: >60
Glucose, Bld: 107 — ABNORMAL HIGH
Glucose, Bld: 87
Potassium: 3.4 — ABNORMAL LOW
Potassium: 3.7
Sodium: 137
Sodium: 138

## 2011-08-19 LAB — HEPATIC FUNCTION PANEL
ALT: 15
AST: 14
Albumin: 3.1 — ABNORMAL LOW
Alkaline Phosphatase: 43
Bilirubin, Direct: 0.2
Indirect Bilirubin: 0.5
Total Bilirubin: 0.7
Total Protein: 5.5 — ABNORMAL LOW

## 2011-08-19 LAB — CARDIAC PANEL(CRET KIN+CKTOT+MB+TROPI)
CK, MB: 0.5
CK, MB: 0.8
Relative Index: INVALID
Relative Index: INVALID
Total CK: 32
Total CK: 46
Troponin I: 0.03
Troponin I: <0.01

## 2011-08-19 LAB — PROTIME-INR
INR: 1
Prothrombin Time: 13.4

## 2011-08-19 LAB — CK TOTAL AND CKMB (NOT AT ARMC)
CK, MB: 0.6
Relative Index: INVALID
Total CK: 35

## 2011-08-19 LAB — APTT: aPTT: 37

## 2011-08-19 LAB — POCT I-STAT GLUCOSE
Glucose, Bld: 84
Glucose, Bld: 85
Operator id: 296371
Operator id: 296371

## 2011-08-19 LAB — TROPONIN I: Troponin I: 0.02

## 2011-09-23 ENCOUNTER — Encounter: Payer: Self-pay | Admitting: *Deleted

## 2011-09-23 NOTE — Progress Notes (Signed)
  Follow up to referral to Dr. Andrey Campanile for sleep study.  Per Pearletha Furl at ENT office - patient rescheduled one appointment & no showed for the most recent one.

## 2011-11-09 HISTORY — PX: BREAST BIOPSY: SHX20

## 2012-05-24 ENCOUNTER — Encounter (HOSPITAL_COMMUNITY): Payer: Self-pay | Admitting: Emergency Medicine

## 2012-05-24 ENCOUNTER — Emergency Department (HOSPITAL_COMMUNITY)
Admission: EM | Admit: 2012-05-24 | Discharge: 2012-05-24 | Disposition: A | Discharge status: Home / Self Care | Payer: Self-pay | Attending: Emergency Medicine | Admitting: Emergency Medicine

## 2012-05-24 DIAGNOSIS — I2510 Atherosclerotic heart disease of native coronary artery without angina pectoris: Secondary | ICD-10-CM | POA: Insufficient documentation

## 2012-05-24 DIAGNOSIS — K219 Gastro-esophageal reflux disease without esophagitis: Secondary | ICD-10-CM | POA: Insufficient documentation

## 2012-05-24 DIAGNOSIS — F17200 Nicotine dependence, unspecified, uncomplicated: Secondary | ICD-10-CM | POA: Insufficient documentation

## 2012-05-24 DIAGNOSIS — E785 Hyperlipidemia, unspecified: Secondary | ICD-10-CM | POA: Insufficient documentation

## 2012-05-24 DIAGNOSIS — R079 Chest pain, unspecified: Secondary | ICD-10-CM | POA: Insufficient documentation

## 2012-05-24 DIAGNOSIS — Z9861 Coronary angioplasty status: Secondary | ICD-10-CM | POA: Insufficient documentation

## 2012-05-24 DIAGNOSIS — N644 Mastodynia: Secondary | ICD-10-CM

## 2012-05-24 NOTE — ED Notes (Signed)
Pt with left breast pain, mother passed away with breast cancer per pt and family member at bedside

## 2012-05-24 NOTE — ED Notes (Signed)
Pt noticed lumps in breast one month ago. Today severe pain in areas of lumps. Pt went to [facility] ED and was told she needed a mammogram. Pt states she doesn't have insurance and needs help getting examination.

## 2012-05-24 NOTE — ED Provider Notes (Signed)
History    This chart was scribed for Stacey Lennert, MD, MD by Smitty Pluck. The patient was seen in room APA18 and the patient's care was started at 12:05PM.   CSN: [id number]  Arrival date & time 05/24/12  1026   First MD Initiated Contact with Patient 05/24/12 1202      Chief Complaint  Patient presents with  . Breast Mass    (Consider location/radiation/quality/duration/timing/severity/associated sxs/prior treatment) Patient is a 51 y.o. female presenting with chest pain. The history is provided by the patient (pt has left breast pain). No language interpreter was used.  Chest Pain The chest pain began 2 days ago. Chest pain occurs constantly. The chest pain is unchanged. Associated with: nothing.    [name] is a 51 y.o. female who presents to the Emergency Department complaining of moderate left breast pain onset 1 month ago. Pt reports that she was seen in [facility] ED 1 day ago for same symptoms and was told she needed a mammogram. Pt reports having lumps in breast and this is where the pain is located. Symptoms have been constant since onset.    Past Medical History  Diagnosis Date  . GERD (gastroesophageal reflux disease)   . Hyperlipidemia   . Noncompliance   . CAD (coronary artery disease)     post PTCA with stent placement  . Morbid obesity     Past Surgical History  Procedure Date  . Cholecystectomy   . Abdominal hysterectomy   . Ptca     WITH STENT PLACEMENT    Family History  Problem Relation Age of Onset  . Coronary artery disease Father     History  Substance Use Topics  . Smoking status: Current Everyday Smoker -- 1.0 packs/day for 35 years    Types: Cigarettes  . Smokeless tobacco: Never Used  . Alcohol Use: No    OB History    Grav Para Term Preterm Abortions TAB SAB Ect Mult Living                  Review of Systems  Cardiovascular: Positive for chest pain.  All other systems reviewed and are negative.   10 Systems  reviewed and all are negative for acute change except as noted in the HPI.   Allergies  Morphine and related  Home Medications   Current Outpatient Rx  Name Route Sig Dispense Refill  . ASPIRIN 325 MG PO TABS Oral Take 325 mg by mouth daily.      . ASPIRIN-ACETAMINOPHEN-CAFFEINE 250-250-65 MG PO TABS Oral Take 1 tablet by mouth every 6 (six) hours as needed. For headache    . HYDROCODONE-ACETAMINOPHEN 5-325 MG PO TABS Oral Take 1 tablet by mouth every 6 (six) hours as needed. For pain      BP 150/86  Pulse 81  Temp 98.1 F (36.7 C) (Oral)  Resp 22  Ht 5\' 2"  (1.575 m)  Wt 280 lb (127.007 kg)  BMI 51.21 kg/m2  SpO2 98%  Physical Exam  Nursing note and vitals reviewed. Constitutional: She is oriented to person, place, and time. She appears well-developed.  HENT:  Head: Normocephalic and atraumatic.  Eyes: Conjunctivae and EOM are normal. No scleral icterus.  Neck: Neck supple. No thyromegaly present.  Cardiovascular: Normal rate and regular rhythm.  Exam reveals no gallop and no friction rub.   No murmur heard. Pulmonary/Chest: No stridor. She has no wheezes. She has no rales. She exhibits tenderness (left anterior breast over left breast ).  Abdominal: She exhibits no distension. There is no tenderness. There is no rebound.  Musculoskeletal: Normal range of motion. She exhibits no edema.  Lymphadenopathy:    She has no cervical adenopathy.  Neurological: She is oriented to person, place, and time. Coordination normal.  Skin: No rash noted. No erythema.  Psychiatric: She has a normal mood and affect. Her behavior is normal.    ED Course  Procedures (including critical care time) DIAGNOSTIC STUDIES: Oxygen Saturation is 98% on room air, normal by my interpretation.    COORDINATION OF CARE: 12:09PM EDP discusses pt ED treatment with pt     Labs Reviewed - No data to display No results found.   No diagnosis found.    MDM    The chart was scribed for me under  my direct supervision.  I personally performed the history, physical, and medical decision making and all procedures in the evaluation of this patient.Stacey Lennert, MD 05/24/12 1318

## 2012-05-24 NOTE — ED Notes (Signed)
Pt triage assessment completed by Jackie Plum not Crista Elliot, NT

## 2012-05-24 NOTE — ED Notes (Signed)
Pt notified of appt time and date for mammogram next week, awaiting on d/c papers from EDP

## 2012-05-24 NOTE — ED Notes (Signed)
MD at bedside. 

## 2012-05-31 ENCOUNTER — Other Ambulatory Visit (HOSPITAL_COMMUNITY): Payer: Self-pay | Admitting: Emergency Medicine

## 2012-05-31 ENCOUNTER — Ambulatory Visit (HOSPITAL_COMMUNITY)
Admission: RE | Admit: 2012-05-31 | Discharge: 2012-05-31 | Disposition: A | Discharge status: Home / Self Care | Payer: Self-pay | Source: Ambulatory Visit | Attending: Emergency Medicine | Admitting: Emergency Medicine

## 2012-05-31 ENCOUNTER — Ambulatory Visit (HOSPITAL_COMMUNITY)
Admit: 2012-05-31 | Discharge: 2012-05-31 | Disposition: A | Discharge status: Home / Self Care | Payer: Self-pay | Source: Ambulatory Visit | Attending: Emergency Medicine | Admitting: Emergency Medicine

## 2012-05-31 DIAGNOSIS — N63 Unspecified lump in unspecified breast: Secondary | ICD-10-CM | POA: Insufficient documentation

## 2012-05-31 DIAGNOSIS — N644 Mastodynia: Secondary | ICD-10-CM

## 2012-06-06 ENCOUNTER — Other Ambulatory Visit (HOSPITAL_COMMUNITY): Payer: Self-pay | Admitting: Nurse Practitioner

## 2012-06-06 DIAGNOSIS — N6310 Unspecified lump in the right breast, unspecified quadrant: Secondary | ICD-10-CM

## 2012-06-14 ENCOUNTER — Inpatient Hospital Stay (HOSPITAL_COMMUNITY): Admission: RE | Admit: 2012-06-14 | Payer: Self-pay | Source: Ambulatory Visit

## 2012-06-14 ENCOUNTER — Other Ambulatory Visit (HOSPITAL_COMMUNITY): Payer: Self-pay | Admitting: Nurse Practitioner

## 2012-06-14 ENCOUNTER — Ambulatory Visit (HOSPITAL_COMMUNITY)
Admission: RE | Admit: 2012-06-14 | Discharge: 2012-06-14 | Disposition: A | Discharge status: Home / Self Care | Payer: PRIVATE HEALTH INSURANCE | Source: Ambulatory Visit | Attending: Nurse Practitioner | Admitting: Nurse Practitioner

## 2012-06-14 DIAGNOSIS — N6310 Unspecified lump in the right breast, unspecified quadrant: Secondary | ICD-10-CM

## 2012-06-14 DIAGNOSIS — IMO0002 Reserved for concepts with insufficient information to code with codable children: Secondary | ICD-10-CM

## 2012-06-14 DIAGNOSIS — N63 Unspecified lump in unspecified breast: Secondary | ICD-10-CM | POA: Insufficient documentation

## 2012-06-14 NOTE — Progress Notes (Signed)
Lidocaine 2%        10mL injected right breast

## 2012-10-26 ENCOUNTER — Other Ambulatory Visit (HOSPITAL_COMMUNITY): Payer: Self-pay | Admitting: Nurse Practitioner

## 2012-10-26 DIAGNOSIS — Z09 Encounter for follow-up examination after completed treatment for conditions other than malignant neoplasm: Secondary | ICD-10-CM

## 2012-12-06 ENCOUNTER — Encounter (HOSPITAL_COMMUNITY): Payer: Self-pay

## 2012-12-20 ENCOUNTER — Encounter (HOSPITAL_COMMUNITY): Payer: Self-pay

## 2013-01-10 ENCOUNTER — Ambulatory Visit (HOSPITAL_COMMUNITY)
Admission: RE | Admit: 2013-01-10 | Discharge: 2013-01-10 | Disposition: A | Discharge status: Home / Self Care | Payer: PRIVATE HEALTH INSURANCE | Source: Ambulatory Visit | Attending: Nurse Practitioner | Admitting: Nurse Practitioner

## 2013-01-10 ENCOUNTER — Encounter (HOSPITAL_COMMUNITY): Payer: Self-pay

## 2013-01-10 DIAGNOSIS — N6489 Other specified disorders of breast: Secondary | ICD-10-CM | POA: Insufficient documentation

## 2013-01-10 DIAGNOSIS — Z09 Encounter for follow-up examination after completed treatment for conditions other than malignant neoplasm: Secondary | ICD-10-CM | POA: Insufficient documentation

## 2013-04-08 ENCOUNTER — Inpatient Hospital Stay (HOSPITAL_COMMUNITY)
Admission: EM | Admit: 2013-04-08 | Discharge: 2013-04-10 | DRG: 394 | Disposition: A | Discharge status: Home / Self Care | Payer: Medicaid Other | Attending: Family Medicine | Admitting: Family Medicine

## 2013-04-08 ENCOUNTER — Encounter (HOSPITAL_COMMUNITY): Payer: Self-pay | Admitting: *Deleted

## 2013-04-08 ENCOUNTER — Emergency Department (HOSPITAL_COMMUNITY): Payer: Medicaid Other

## 2013-04-08 DIAGNOSIS — G4700 Insomnia, unspecified: Secondary | ICD-10-CM

## 2013-04-08 DIAGNOSIS — I10 Essential (primary) hypertension: Secondary | ICD-10-CM | POA: Diagnosis present

## 2013-04-08 DIAGNOSIS — Z803 Family history of malignant neoplasm of breast: Secondary | ICD-10-CM

## 2013-04-08 DIAGNOSIS — F17210 Nicotine dependence, cigarettes, uncomplicated: Secondary | ICD-10-CM | POA: Diagnosis present

## 2013-04-08 DIAGNOSIS — Z8249 Family history of ischemic heart disease and other diseases of the circulatory system: Secondary | ICD-10-CM

## 2013-04-08 DIAGNOSIS — E669 Obesity, unspecified: Secondary | ICD-10-CM

## 2013-04-08 DIAGNOSIS — K644 Residual hemorrhoidal skin tags: Principal | ICD-10-CM | POA: Diagnosis present

## 2013-04-08 DIAGNOSIS — G4733 Obstructive sleep apnea (adult) (pediatric): Secondary | ICD-10-CM | POA: Diagnosis present

## 2013-04-08 DIAGNOSIS — R079 Chest pain, unspecified: Secondary | ICD-10-CM | POA: Diagnosis present

## 2013-04-08 DIAGNOSIS — Z6841 Body Mass Index (BMI) 40.0 and over, adult: Secondary | ICD-10-CM

## 2013-04-08 DIAGNOSIS — R600 Localized edema: Secondary | ICD-10-CM

## 2013-04-08 DIAGNOSIS — E785 Hyperlipidemia, unspecified: Secondary | ICD-10-CM | POA: Diagnosis present

## 2013-04-08 DIAGNOSIS — D696 Thrombocytopenia, unspecified: Secondary | ICD-10-CM | POA: Diagnosis present

## 2013-04-08 DIAGNOSIS — F411 Generalized anxiety disorder: Secondary | ICD-10-CM | POA: Diagnosis present

## 2013-04-08 DIAGNOSIS — Z23 Encounter for immunization: Secondary | ICD-10-CM

## 2013-04-08 DIAGNOSIS — J4489 Other specified chronic obstructive pulmonary disease: Secondary | ICD-10-CM | POA: Diagnosis present

## 2013-04-08 DIAGNOSIS — G4730 Sleep apnea, unspecified: Secondary | ICD-10-CM | POA: Diagnosis present

## 2013-04-08 DIAGNOSIS — Z800 Family history of malignant neoplasm of digestive organs: Secondary | ICD-10-CM

## 2013-04-08 DIAGNOSIS — F17200 Nicotine dependence, unspecified, uncomplicated: Secondary | ICD-10-CM | POA: Diagnosis present

## 2013-04-08 DIAGNOSIS — Z91199 Patient's noncompliance with other medical treatment and regimen due to unspecified reason: Secondary | ICD-10-CM

## 2013-04-08 DIAGNOSIS — R0600 Dyspnea, unspecified: Secondary | ICD-10-CM

## 2013-04-08 DIAGNOSIS — Z9119 Patient's noncompliance with other medical treatment and regimen: Secondary | ICD-10-CM

## 2013-04-08 DIAGNOSIS — Z79899 Other long term (current) drug therapy: Secondary | ICD-10-CM

## 2013-04-08 DIAGNOSIS — Z9861 Coronary angioplasty status: Secondary | ICD-10-CM

## 2013-04-08 DIAGNOSIS — E1169 Type 2 diabetes mellitus with other specified complication: Secondary | ICD-10-CM | POA: Diagnosis present

## 2013-04-08 DIAGNOSIS — K922 Gastrointestinal hemorrhage, unspecified: Secondary | ICD-10-CM | POA: Diagnosis present

## 2013-04-08 DIAGNOSIS — E119 Type 2 diabetes mellitus without complications: Secondary | ICD-10-CM | POA: Diagnosis present

## 2013-04-08 DIAGNOSIS — J449 Chronic obstructive pulmonary disease, unspecified: Secondary | ICD-10-CM | POA: Diagnosis present

## 2013-04-08 DIAGNOSIS — K219 Gastro-esophageal reflux disease without esophagitis: Secondary | ICD-10-CM | POA: Diagnosis present

## 2013-04-08 DIAGNOSIS — I2510 Atherosclerotic heart disease of native coronary artery without angina pectoris: Secondary | ICD-10-CM

## 2013-04-08 LAB — CBC WITH DIFFERENTIAL/PLATELET
Basophils Absolute: 0.1 10*3/uL (ref 0.0–0.1)
Basophils Relative: 1 % (ref 0–1)
Eosinophils Absolute: 1.1 10*3/uL — ABNORMAL HIGH (ref 0.0–0.7)
Eosinophils Relative: 13 % — ABNORMAL HIGH (ref 0–5)
HCT: 38.8 % (ref 36.0–46.0)
Hemoglobin: 13.3 g/dL (ref 12.0–15.0)
Lymphocytes Relative: 35 % (ref 12–46)
Lymphs Abs: 2.9 10*3/uL (ref 0.7–4.0)
MCH: 33.5 pg (ref 26.0–34.0)
MCHC: 34.3 g/dL (ref 30.0–36.0)
MCV: 97.7 fL (ref 78.0–100.0)
Monocytes Absolute: 0.5 10*3/uL (ref 0.1–1.0)
Monocytes Relative: 6 % (ref 3–12)
Neutro Abs: 3.7 10*3/uL (ref 1.7–7.7)
Neutrophils Relative %: 45 % (ref 43–77)
Platelets: 151 10*3/uL (ref 150–400)
RBC: 3.97 MIL/uL (ref 3.87–5.11)
RDW: 15.3 % (ref 11.5–15.5)
WBC: 8.3 10*3/uL (ref 4.0–10.5)

## 2013-04-08 LAB — COMPREHENSIVE METABOLIC PANEL
ALT: 23 U/L (ref 0–35)
AST: 22 U/L (ref 0–37)
Albumin: 4.1 g/dL (ref 3.5–5.2)
Alkaline Phosphatase: 71 U/L (ref 39–117)
BUN: 17 mg/dL (ref 6–23)
CO2: 27 mEq/L (ref 19–32)
Calcium: 9.8 mg/dL (ref 8.4–10.5)
Chloride: 105 mEq/L (ref 96–112)
Creatinine, Ser: 1.03 mg/dL (ref 0.50–1.10)
GFR calc Af Amer: 71 mL/min — ABNORMAL LOW (ref >90)
GFR calc non Af Amer: 61 mL/min — ABNORMAL LOW (ref >90)
Glucose, Bld: 107 mg/dL — ABNORMAL HIGH (ref 70–99)
Potassium: 3.7 mEq/L (ref 3.5–5.1)
Sodium: 142 mEq/L (ref 135–145)
Total Bilirubin: 0.2 mg/dL — ABNORMAL LOW (ref 0.3–1.2)
Total Protein: 6.8 g/dL (ref 6.0–8.3)

## 2013-04-08 LAB — POCT I-STAT TROPONIN I: Troponin i, poc: 0 ng/mL (ref 0.00–0.08)

## 2013-04-08 MED ORDER — HYDROMORPHONE HCL PF 1 MG/ML IJ SOLN
1.0000 mg | Freq: Once | INTRAMUSCULAR | Status: AC
  Administered 2013-04-08: 1 mg via INTRAVENOUS
  Filled 2013-04-08: qty 1

## 2013-04-08 MED ORDER — ONDANSETRON HCL 4 MG/2ML IJ SOLN
4.0000 mg | Freq: Once | INTRAMUSCULAR | Status: AC
  Administered 2013-04-08: 4 mg via INTRAVENOUS
  Filled 2013-04-08: qty 2

## 2013-04-08 NOTE — ED Notes (Signed)
Pt states she did take her 325 mg ASA this morning.

## 2013-04-08 NOTE — ED Notes (Signed)
Pt reports chest pain that started around 1830, pain described as pressure. Pt also states some blood in stool after having a hard stool this morning.

## 2013-04-08 NOTE — ED Provider Notes (Signed)
History  This chart was scribed for [employer]. Colon Branch, MD by Bennett Scrape, ED Scribe. This patient was seen in room APA18/APA18 and the patient's care was started at 10:57 PM.  CSN: [id number]  Arrival date & time 04/08/13  2110   First MD Initiated Contact with Patient 04/08/13 2157      Chief Complaint  Patient presents with  . Chest Pain  . Rectal Bleeding  . Weakness  . Abdominal Pain     The history is provided by the patient. No language interpreter was used.   HPI Comments: [name] is a 52 y.o. female who presents to the Emergency Department complaining of one episode of hematochezia described as " the toilet filled with blood" with associated abdominal pain described as achy, CP described as pressure, SOB, fatigue, dizziness and lightheadedness since 6:30 AM this morning. She reports prior episodes of less severe symptoms with straining and constipation. She reports one prior blood transfusion after her hysterectomy but denies having any prior colonoscopy. She denies any other symptoms currently. She has a h/o HLD, GERD, HTN and DM. Pt is a current everyday smoker but denies alcohol use.  PCP is the [facility] Department  Past Medical History  Diagnosis Date  . GERD (gastroesophageal reflux disease)   . Hyperlipidemia   . Noncompliance   . CAD (coronary artery disease)     post PTCA with stent placement  . Morbid obesity   . Diabetes mellitus without complication   . Hypertension     Past Surgical History  Procedure Laterality Date  . Cholecystectomy    . Abdominal hysterectomy    . Ptca      WITH STENT PLACEMENT    Family History  Problem Relation Age of Onset  . Coronary artery disease Father     History  Substance Use Topics  . Smoking status: Current Every Day Smoker -- 1.00 packs/day for 35 years    Types: Cigarettes  . Smokeless tobacco: Never Used  . Alcohol Use: No    No OB history provided.  Review of Systems  Constitutional:        10 systems reviewed and are negative for acute change except as noted in the HPI  HENT: Negative.  Negative for congestion.   Eyes: Negative for discharge and redness.  Respiratory: Positive for shortness of breath. Negative for cough.   Cardiovascular: Positive for chest pain. Negative for leg swelling.  Gastrointestinal: Positive for abdominal pain and blood in stool. Negative for nausea and vomiting.  Genitourinary: Negative.   Musculoskeletal: Negative.   Skin: Negative for rash.  Neurological: Positive for dizziness and light-headedness. Negative for syncope.  Psychiatric/Behavioral:       No behavior change    Allergies  Morphine and related  Home Medications   Current Outpatient Rx  Name  Route  Sig  Dispense  Refill  . aspirin 325 MG tablet   Oral   Take 325 mg by mouth daily.           . cyclobenzaprine (FLEXERIL) 10 MG tablet   Oral   Take 5-10 mg by mouth 3 (three) times daily as needed for muscle spasms.         . metFORMIN (GLUCOPHAGE-XR) 500 MG 24 hr tablet   Oral   Take 500 mg by mouth daily with breakfast.         . naproxen (NAPROSYN) 500 MG tablet   Oral   Take 500 mg by mouth 2 (  two) times daily with a meal.         . ranitidine (ZANTAC) 150 MG tablet   Oral   Take 150 mg by mouth 2 (two) times daily.           Triage Vitals: BP 129/98  Pulse 83  Temp(Src) 98.2 F (36.8 C) (Oral)  Resp 20  Ht 5\' 2"  (1.575 m)  Wt 312 lb (141.522 kg)  BMI 57.05 kg/m2  SpO2 97%  Physical Exam  Nursing note and vitals reviewed. Constitutional: She is oriented to person, place, and time. She appears well-developed and well-nourished. No distress.  HENT:  Head: Normocephalic and atraumatic.  Eyes: Conjunctivae and EOM are normal. Pupils are equal, round, and reactive to light.  Neck: Neck supple. No tracheal deviation present.  Cardiovascular: Normal rate and regular rhythm.  Exam reveals no gallop and no friction rub.   No murmur  heard. Pulmonary/Chest: Effort normal and breath sounds normal. No respiratory distress.  Abdominal: Soft. There is no tenderness.  Genitourinary: Guaiac positive stool.  Musculoskeletal: Normal range of motion. She exhibits no edema.  Neurological: She is alert and oriented to person, place, and time.  Skin: Skin is warm and dry.  Psychiatric: She has a normal mood and affect. Her behavior is normal.    ED Course  Procedures (including critical care time)  Medications  HYDROmorphone (DILAUDID) injection 1 mg (not administered)  ondansetron (ZOFRAN) injection 4 mg (not administered)    DIAGNOSTIC STUDIES: Oxygen Saturation is 97% on room air, normal by my interpretation.    COORDINATION OF CARE: 11:21 PM-Discussed treatment plan which includes medications, CXR, CBC panel, CMP and troponin with pt at bedside and pt agreed to plan.  Advised pt that admission is probable. Results for orders placed during the hospital encounter of 04/08/13  CBC WITH DIFFERENTIAL      Result Value Range   WBC 8.3  4.0 - 10.5 K/uL   RBC 3.97  3.87 - 5.11 MIL/uL   Hemoglobin 13.3  12.0 - 15.0 g/dL   HCT 19.1  47.8 - 29.5 %   MCV 97.7  78.0 - 100.0 fL   MCH 33.5  26.0 - 34.0 pg   MCHC 34.3  30.0 - 36.0 g/dL   RDW 62.1  30.8 - 65.7 %   Platelets 151  150 - 400 K/uL   Neutrophils Relative % 45  43 - 77 %   Neutro Abs 3.7  1.7 - 7.7 K/uL   Lymphocytes Relative 35  12 - 46 %   Lymphs Abs 2.9  0.7 - 4.0 K/uL   Monocytes Relative 6  3 - 12 %   Monocytes Absolute 0.5  0.1 - 1.0 K/uL   Eosinophils Relative 13 (*) 0 - 5 %   Eosinophils Absolute 1.1 (*) 0.0 - 0.7 K/uL   Basophils Relative 1  0 - 1 %   Basophils Absolute 0.1  0.0 - 0.1 K/uL  COMPREHENSIVE METABOLIC PANEL      Result Value Range   Sodium 142  135 - 145 mEq/L   Potassium 3.7  3.5 - 5.1 mEq/L   Chloride 105  96 - 112 mEq/L   CO2 27  19 - 32 mEq/L   Glucose, Bld 107 (*) 70 - 99 mg/dL   BUN 17  6 - 23 mg/dL   Creatinine, Ser 8.46  0.50 -  1.10 mg/dL   Calcium 9.8  8.4 - 96.2 mg/dL   Total Protein 6.8  6.0 -  8.3 g/dL   Albumin 4.1  3.5 - 5.2 g/dL   AST 22  0 - 37 U/L   ALT 23  0 - 35 U/L   Alkaline Phosphatase 71  39 - 117 U/L   Total Bilirubin 0.2 (*) 0.3 - 1.2 mg/dL   GFR calc non Af Amer 61 (*) >90 mL/min   GFR calc Af Amer 71 (*) >90 mL/min  POCT I-STAT TROPONIN I      Result Value Range   Troponin i, poc 0.00  0.00 - 0.08 ng/mL   Comment 3             Dg Chest Portable 1 View  04/08/2013   *RADIOLOGY REPORT*  Clinical Data: Chest pain, shortness of breath and rectal bleeding. Weakness and abdominal pain.  PORTABLE CHEST - 1 VIEW  Comparison: Chest radiograph performed 05/30/2011  Findings: The lungs are well-aerated and clear.  There is no evidence of focal opacification, pleural effusion or pneumothorax.  The heart is mildly enlarged.  No acute osseous abnormalities are seen.  IMPRESSION: Mild cardiomegaly; no acute cardiopulmonary process seen.   Original Report Authenticated By: Tonia Ghent, M.D.    Date: 04/08/2013   2119  Rate: 82  Rhythm: normal sinus rhythm  QRS Axis: normal  Intervals: normal  ST/T Wave abnormalities: nonspecific ST/T changes  Conduction Disutrbances:none  Narrative Interpretation:   Old EKG Reviewed: unchanged c/w 12/07/2010      12:54 AM:  T/C to Dr. Orvan Falconer, hospitalist, case discussed, including:  HPI, pertinent PM/SHx, VS/PE, dx testing, ED course and treatment.  Agreeable to admission.  Requests to write temporary orders, tele bed to team 2.  MDM  Patient woke up this morning with a bloody stool and has had abdominal pain all day. Labs are normal, EKG is normal, chest xray is normal. Will arrange for admission.Spoke with Dr. Orvan Falconer who will admit the patient. Pt stable in ED with no significant deterioration in condition.The patient appears reasonably stabilized for admission considering the current resources, flow, and capabilities available in the ED at this time, and I doubt  any other [facility] requiring further screening and/or treatment in the ED prior to admission.  I personally performed the services described in this documentation, which was scribed in my presence. The recorded information has been reviewed and considered.  MDM Reviewed: nursing note and vitals Interpretation: labs, ECG and x-ray       Nicoletta Dress. Colon Branch, MD 04/09/13 [phone]

## 2013-04-08 NOTE — ED Notes (Signed)
Pt woke up this morning and had a bloody stool and felt weak. C/o abdominal pain at that time. Pt is now c/o centralized chest pressure since 630 pm with some associated sob, dizziness, and light headed.

## 2013-04-09 ENCOUNTER — Encounter (HOSPITAL_COMMUNITY): Payer: Self-pay | Admitting: Internal Medicine

## 2013-04-09 DIAGNOSIS — K625 Hemorrhage of anus and rectum: Secondary | ICD-10-CM

## 2013-04-09 DIAGNOSIS — F17200 Nicotine dependence, unspecified, uncomplicated: Secondary | ICD-10-CM

## 2013-04-09 DIAGNOSIS — Z800 Family history of malignant neoplasm of digestive organs: Secondary | ICD-10-CM

## 2013-04-09 DIAGNOSIS — E669 Obesity, unspecified: Secondary | ICD-10-CM

## 2013-04-09 DIAGNOSIS — K922 Gastrointestinal hemorrhage, unspecified: Secondary | ICD-10-CM | POA: Diagnosis present

## 2013-04-09 DIAGNOSIS — D696 Thrombocytopenia, unspecified: Secondary | ICD-10-CM | POA: Diagnosis present

## 2013-04-09 DIAGNOSIS — E119 Type 2 diabetes mellitus without complications: Secondary | ICD-10-CM | POA: Diagnosis present

## 2013-04-09 DIAGNOSIS — R079 Chest pain, unspecified: Secondary | ICD-10-CM

## 2013-04-09 LAB — CBC
HCT: 36.6 % (ref 36.0–46.0)
Hemoglobin: 12.2 g/dL (ref 12.0–15.0)
MCH: 32.9 pg (ref 26.0–34.0)
MCHC: 33.3 g/dL (ref 30.0–36.0)
MCV: 98.7 fL (ref 78.0–100.0)
Platelets: 139 10*3/uL — ABNORMAL LOW (ref 150–400)
RBC: 3.71 MIL/uL — ABNORMAL LOW (ref 3.87–5.11)
RDW: 15.7 % — ABNORMAL HIGH (ref 11.5–15.5)
WBC: 6.6 10*3/uL (ref 4.0–10.5)

## 2013-04-09 LAB — MRSA PCR SCREENING: MRSA by PCR: NEGATIVE

## 2013-04-09 LAB — HEMOGLOBIN A1C
Hgb A1c MFr Bld: 5.8 % — ABNORMAL HIGH (ref <5.7)
Mean Plasma Glucose: 120 mg/dL — ABNORMAL HIGH (ref <117)

## 2013-04-09 LAB — COMPREHENSIVE METABOLIC PANEL
ALT: 30 U/L (ref 0–35)
AST: 40 U/L — ABNORMAL HIGH (ref 0–37)
Albumin: 3.9 g/dL (ref 3.5–5.2)
Alkaline Phosphatase: 72 U/L (ref 39–117)
BUN: 18 mg/dL (ref 6–23)
CO2: 27 mEq/L (ref 19–32)
Calcium: 9.2 mg/dL (ref 8.4–10.5)
Chloride: 105 mEq/L (ref 96–112)
Creatinine, Ser: 1.05 mg/dL (ref 0.50–1.10)
GFR calc Af Amer: 69 mL/min — ABNORMAL LOW (ref >90)
GFR calc non Af Amer: 60 mL/min — ABNORMAL LOW (ref >90)
Glucose, Bld: 99 mg/dL (ref 70–99)
Potassium: 3.6 mEq/L (ref 3.5–5.1)
Sodium: 142 mEq/L (ref 135–145)
Total Bilirubin: 0.3 mg/dL (ref 0.3–1.2)
Total Protein: 6.5 g/dL (ref 6.0–8.3)

## 2013-04-09 LAB — HEMOGLOBIN AND HEMATOCRIT, BLOOD
HCT: 39.2 % (ref 36.0–46.0)
HCT: 39.2 % (ref 36.0–46.0)
Hemoglobin: 13.2 g/dL (ref 12.0–15.0)
Hemoglobin: 13.7 g/dL (ref 12.0–15.0)

## 2013-04-09 LAB — GLUCOSE, CAPILLARY
Glucose-Capillary: 100 mg/dL — ABNORMAL HIGH (ref 70–99)
Glucose-Capillary: 95 mg/dL (ref 70–99)
Glucose-Capillary: 101 mg/dL — ABNORMAL HIGH (ref 70–99)
Glucose-Capillary: 101 mg/dL — ABNORMAL HIGH (ref 70–99)

## 2013-04-09 LAB — TROPONIN I
Troponin I: <0.3 ng/mL (ref <0.30)
Troponin I: <0.3 ng/mL (ref <0.30)

## 2013-04-09 LAB — LIPID PANEL
Cholesterol: 196 mg/dL (ref 0–200)
HDL: 44 mg/dL (ref >39)
LDL Cholesterol: 115 mg/dL — ABNORMAL HIGH (ref 0–99)
Total CHOL/HDL Ratio: 4.5 RATIO
Triglycerides: 184 mg/dL — ABNORMAL HIGH (ref <150)
VLDL: 37 mg/dL (ref 0–40)

## 2013-04-09 LAB — PROTIME-INR
INR: 1.01 (ref 0.00–1.49)
Prothrombin Time: 13.2 seconds (ref 11.6–15.2)

## 2013-04-09 LAB — TSH: TSH: 8.526 u[IU]/mL — ABNORMAL HIGH (ref 0.350–4.500)

## 2013-04-09 MED ORDER — PNEUMOCOCCAL VAC POLYVALENT 25 MCG/0.5ML IJ INJ
0.5000 mL | INJECTION | INTRAMUSCULAR | Status: DC
  Filled 2013-04-09: qty 0.5

## 2013-04-09 MED ORDER — PANTOPRAZOLE SODIUM 40 MG IV SOLR
40.0000 mg | Freq: Once | INTRAVENOUS | Status: AC
  Administered 2013-04-09: 40 mg via INTRAVENOUS
  Filled 2013-04-09: qty 40

## 2013-04-09 MED ORDER — CHLORHEXIDINE GLUCONATE CLOTH 2 % EX PADS
6.0000 | MEDICATED_PAD | Freq: Once | CUTANEOUS | Status: AC
  Administered 2013-04-09: 6 via TOPICAL

## 2013-04-09 MED ORDER — NITROGLYCERIN 0.4 MG SL SUBL
0.4000 mg | SUBLINGUAL_TABLET | SUBLINGUAL | Status: DC | PRN
  Administered 2013-04-09: 0.4 mg via SUBLINGUAL
  Filled 2013-04-09: qty 25

## 2013-04-09 MED ORDER — PEG 3350-KCL-NABCB-NACL-NASULF 236 G PO SOLR
4000.0000 mL | Freq: Once | ORAL | Status: AC
  Administered 2013-04-09: 4000 mL via ORAL
  Filled 2013-04-09: qty 4000

## 2013-04-09 MED ORDER — ACETAMINOPHEN 325 MG PO TABS: 650.0000 mg | ORAL_TABLET | ORAL | Status: DC | PRN

## 2013-04-09 MED ORDER — PANTOPRAZOLE SODIUM 40 MG PO TBEC
40.0000 mg | DELAYED_RELEASE_TABLET | Freq: Two times a day (BID) | ORAL | Status: DC
  Administered 2013-04-09 – 2013-04-10 (×3): 40 mg via ORAL
  Filled 2013-04-09 (×3): qty 1

## 2013-04-09 MED ORDER — SODIUM CHLORIDE 0.9 % IV SOLN
INTRAVENOUS | Status: AC
  Administered 2013-04-09 (×2): via INTRAVENOUS

## 2013-04-09 MED ORDER — ONDANSETRON HCL 4 MG/2ML IJ SOLN
4.0000 mg | INTRAMUSCULAR | Status: DC | PRN
  Administered 2013-04-09: 4 mg via INTRAVENOUS
  Filled 2013-04-09: qty 2

## 2013-04-09 MED ORDER — INSULIN ASPART 100 UNIT/ML ~~LOC~~ SOLN: 0.0000 [IU] | Freq: Every day | SUBCUTANEOUS | Status: DC

## 2013-04-09 MED ORDER — TRAZODONE HCL 50 MG PO TABS
50.0000 mg | ORAL_TABLET | Freq: Every evening | ORAL | Status: DC | PRN
  Administered 2013-04-09: 50 mg via ORAL
  Filled 2013-04-09: qty 1

## 2013-04-09 MED ORDER — INSULIN ASPART 100 UNIT/ML ~~LOC~~ SOLN: 0.0000 [IU] | Freq: Three times a day (TID) | SUBCUTANEOUS | Status: DC

## 2013-04-09 NOTE — Progress Notes (Signed)
[facility]  SULLY MANZI ZOX:[id number] DOB: 12/08/1960 DOA: 04/08/2013 PCP: Selinda Flavin, MD  Patient seen, independently examined and chart reviewed. I agree with exam, assessment and plan discussed with Toya Smothers, NP.  Interval history/Subjective: No further bleeding. She feels okay.  Objective: Appears calm and comfortable. Afebrile, vital signs stable. Cardiovascular regular rate and rhythm. Respiratory clear to auscultation bilaterally. Abdomen soft.  Labs/studies: Cardiac enzymes negative. TSH high. EKG normal sinus rhythm, T wave abnormality, consider anterior ischemia. Nonspecific ST changes. Compared to previous study 05/31/2011, no acute changes are seen.  Assessment:  Lower GI bleed: Serial CBC. Further evaluation per GI.  History of atypical chest pain: No of recurrent episodes of atypical chest pain per chart reviewed.  History of coronary artery disease with prior stent placement: Last cardiology visit 06/2011 at which time she was doing well, per Dr. Earnestine Leys "She also had several weeks ago a myocardial perfusion study done which showed normal LV perfusion and ejection fraction of 62%". This apparently was performed at [facility]. Last left heart catheterization on file 07/2007: Mild diffuse nonobstructive coronary artery disease. Normal left ventricular systolic function. No evidence of pulmonary hypertension. Medical management recommended.  GERD  COPD, sleep apnea, obesity  Elevated TSH  Plan:  Colonoscopy per gastroenterology. While the patient does have a history of coronary disease, she has had several evaluations for atypical chest pain over the last several years and 2 years ago was noted to have had a normal nuclear study. Prior to that she had left heart catheterization that showed nonobstructive disease. Her symptoms of chest pain are very atypical and short lasting. Cardiac enzymes are negative and her EKG is unremarkable. In the absence of recurrent pain or  change in her condition would proceed with colonoscopy without further evaluation.  Summary: 52 year old woman with presented with bright red blood per rectum. Also had an episode of atypical chest pain.she was admitted for monitoring of her hemoglobin, GI consultation and cardiac rule out.   Brendia Sacks, MD [facility] [phone]

## 2013-04-09 NOTE — Consult Note (Signed)
Reason for Consult:GI bleed Referring Physician: Hospitalist.  [name] is an 52 y.o. female.  HPI:  Admitted last night from the ED last night with heaviness in her chest.  She tells me she had rectal bleeding Sunday morning. Sunday morning when she went to the bathroom. She had two small BM and then had what she describes as a lot of bright red blood. There was no pain associated with her rectal bleeding. She does tells me she felt weak after the BM. She did strain when she had the BM.  She had one other episode of rectal bleeding that am.  She also tells me she had chills and then she would feel hot.  She has never undergone a colonoscopy in the past. She usually has a BM every 3-4 days. ON a ASA 325mg  daily due to cardiac stent. Cardiac stent placed 3 yrs ago at [facility].  Also has been taking Naproxen 500mg  BID. No prior hx of an MI. Hx of colon cancer: Father: age 29. He is his 50s now. Father's brother died from colon cancer. Mother has hx of breast cancer and is deceased. She tells me they are monitoring her rt breast for breast cancer. Appetite is good. No unintentional weight loss. Today she has tenderness in her lower abdomen which is not new. No prior hx of rectal bleeding. She is disabled.  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 Four children all living. One deceased just after birth: cardiac. Diabetic since November of 2013.  Past Medical History  Diagnosis Date  . GERD (gastroesophageal reflux disease)   . Hyperlipidemia   . Noncompliance   . CAD (coronary artery disease)     post PTCA with stent placement  . Morbid obesity   . Diabetes mellitus without complication   . Hypertension     Past Surgical History  Procedure Laterality Date  . Cholecystectomy    . Abdominal hysterectomy    . Ptca      WITH STENT PLACEMENT    Family History  Problem Relation Age of Onset  . Coronary artery disease Father   . Cancer - Colon Father    Current  Facility-Administered Medications  Medication Dose Route Frequency Provider Last Rate Last Dose  . 0.9 %  sodium chloride infusion   Intravenous STAT Nicoletta Dress. Colon Branch, MD 75 mL/hr at 04/09/13 0233    . acetaminophen (TYLENOL) tablet 650 mg  650 mg Oral Q4H PRN Vania Rea, MD      . insulin aspart (novoLOG) injection 0-15 Units  0-15 Units Subcutaneous TID WC Vania Rea, MD      . insulin aspart (novoLOG) injection 0-5 Units  0-5 Units Subcutaneous QHS Vania Rea, MD      . nitroGLYCERIN (NITROSTAT) SL tablet 0.4 mg  0.4 mg Sublingual Q5 Min x 3 PRN Vania Rea, MD   0.4 mg at 04/09/13 0416  . ondansetron (ZOFRAN) injection 4 mg  4 mg Intravenous Q4H PRN Vania Rea, MD      . pantoprazole (PROTONIX) EC tablet 40 mg  40 mg Oral BID Vania Rea, MD      . Melene Muller ON 04/10/2013] pneumococcal 23 valent vaccine (PNU-IMMUNE) injection 0.5 mL  0.5 mL Intramuscular Tomorrow-1000 Vania Rea, MD      . traZODone (DESYREL) tablet 50 mg  50 mg Oral QHS PRN Vania Rea, MD        Social History:  reports that she has been smoking Cigarettes.  She has a 35 pack-year smoking history. She has never used smokeless tobacco. She reports that she does not drink alcohol or use illicit drugs.  Allergies:  Allergies  Allergen Reactions  . Morphine And Related Nausea And Vomiting    Medications: I have reviewed the patient's current medications.  Results for orders placed during the hospital encounter of 04/08/13 (from the past 48 hour(s))  POCT I-STAT TROPONIN I     Status: None   Collection Time    04/08/13  9:36 PM      Result Value Range   Troponin i, poc 0.00  0.00 - 0.08 ng/mL   Comment 3            Comment: Due to the release kinetics of cTnI,     a negative result within the first hours     of the onset of symptoms does not rule out     myocardial infarction with certainty.     If myocardial infarction is still suspected,     repeat the test at appropriate  intervals.  CBC WITH DIFFERENTIAL     Status: Abnormal   Collection Time    04/08/13 10:00 PM      Result Value Range   WBC 8.3  4.0 - 10.5 K/uL   RBC 3.97  3.87 - 5.11 MIL/uL   Hemoglobin 13.3  12.0 - 15.0 g/dL   HCT 16.1  09.6 - 04.5 %   MCV 97.7  78.0 - 100.0 fL   MCH 33.5  26.0 - 34.0 pg   MCHC 34.3  30.0 - 36.0 g/dL   RDW 40.9  81.1 - 91.4 %   Platelets 151  150 - 400 K/uL   Neutrophils Relative % 45  43 - 77 %   Neutro Abs 3.7  1.7 - 7.7 K/uL   Lymphocytes Relative 35  12 - 46 %   Lymphs Abs 2.9  0.7 - 4.0 K/uL   Monocytes Relative 6  3 - 12 %   Monocytes Absolute 0.5  0.1 - 1.0 K/uL   Eosinophils Relative 13 (*) 0 - 5 %   Eosinophils Absolute 1.1 (*) 0.0 - 0.7 K/uL   Basophils Relative 1  0 - 1 %   Basophils Absolute 0.1  0.0 - 0.1 K/uL  COMPREHENSIVE METABOLIC PANEL     Status: Abnormal   Collection Time    04/08/13 10:00 PM      Result Value Range   Sodium 142  135 - 145 mEq/L   Potassium 3.7  3.5 - 5.1 mEq/L   Chloride 105  96 - 112 mEq/L   CO2 27  19 - 32 mEq/L   Glucose, Bld 107 (*) 70 - 99 mg/dL   BUN 17  6 - 23 mg/dL   Creatinine, Ser 7.82  0.50 - 1.10 mg/dL   Calcium 9.8  8.4 - 95.6 mg/dL   Total Protein 6.8  6.0 - 8.3 g/dL   Albumin 4.1  3.5 - 5.2 g/dL   AST 22  0 - 37 U/L   ALT 23  0 - 35 U/L   Alkaline Phosphatase 71  39 - 117 U/L   Total Bilirubin 0.2 (*) 0.3 - 1.2 mg/dL   GFR calc non Af Amer 61 (*) >90 mL/min   GFR calc Af Amer 71 (*) >90 mL/min   Comment:            The eGFR has been calculated     using the CKD  EPI equation.     This calculation has not been     validated in all clinical     situations.     eGFR's persistently     <90 mL/min signify     possible Chronic Kidney Disease.  MRSA PCR SCREENING     Status: None   Collection Time    04/09/13  2:13 AM      Result Value Range   MRSA by PCR NEGATIVE  NEGATIVE   Comment:            The GeneXpert MRSA Assay (FDA     approved for NASAL specimens     only), is one component of a      comprehensive MRSA colonization     surveillance program. It is not     intended to diagnose MRSA     infection nor to guide or     monitor treatment for     MRSA infections.  PROTIME-INR     Status: None   Collection Time    04/09/13  4:50 AM      Result Value Range   Prothrombin Time 13.2  11.6 - 15.2 seconds   INR 1.01  0.00 - 1.49  CBC     Status: Abnormal   Collection Time    04/09/13  4:50 AM      Result Value Range   WBC 6.6  4.0 - 10.5 K/uL   RBC 3.71 (*) 3.87 - 5.11 MIL/uL   Hemoglobin 12.2  12.0 - 15.0 g/dL   HCT 16.1  09.6 - 04.5 %   MCV 98.7  78.0 - 100.0 fL   MCH 32.9  26.0 - 34.0 pg   MCHC 33.3  30.0 - 36.0 g/dL   RDW 40.9 (*) 81.1 - 91.4 %   Platelets 139 (*) 150 - 400 K/uL  COMPREHENSIVE METABOLIC PANEL     Status: Abnormal   Collection Time    04/09/13  4:50 AM      Result Value Range   Sodium 142  135 - 145 mEq/L   Potassium 3.6  3.5 - 5.1 mEq/L   Chloride 105  96 - 112 mEq/L   CO2 27  19 - 32 mEq/L   Glucose, Bld 99  70 - 99 mg/dL   BUN 18  6 - 23 mg/dL   Creatinine, Ser 7.82  0.50 - 1.10 mg/dL   Calcium 9.2  8.4 - 95.6 mg/dL   Total Protein 6.5  6.0 - 8.3 g/dL   Albumin 3.9  3.5 - 5.2 g/dL   AST 40 (*) 0 - 37 U/L   ALT 30  0 - 35 U/L   Alkaline Phosphatase 72  39 - 117 U/L   Total Bilirubin 0.3  0.3 - 1.2 mg/dL   GFR calc non Af Amer 60 (*) >90 mL/min   GFR calc Af Amer 69 (*) >90 mL/min   Comment:            The eGFR has been calculated     using the CKD EPI equation.     This calculation has not been     validated in all clinical     situations.     eGFR's persistently     <90 mL/min signify     possible Chronic Kidney Disease.  TROPONIN I     Status: None   Collection Time    04/09/13  4:50 AM      Result Value Range  Troponin I <0.30  <0.30 ng/mL   Comment:            Due to the release kinetics of cTnI,     a negative result within the first hours     of the onset of symptoms does not rule out     myocardial infarction with  certainty.     If myocardial infarction is still suspected,     repeat the test at appropriate intervals.  GLUCOSE, CAPILLARY     Status: Abnormal   Collection Time    04/09/13  7:41 AM      Result Value Range   Glucose-Capillary 100 (*) 70 - 99 mg/dL   Comment 1 Documented in Chart     Comment 2 Notify RN      Dg Chest Portable 1 View  04/08/2013   *RADIOLOGY REPORT*  Clinical Data: Chest pain, shortness of breath and rectal bleeding. Weakness and abdominal pain.  PORTABLE CHEST - 1 VIEW  Comparison: Chest radiograph performed 05/30/2011  Findings: The lungs are well-aerated and clear.  There is no evidence of focal opacification, pleural effusion or pneumothorax.  The heart is mildly enlarged.  No acute osseous abnormalities are seen.  IMPRESSION: Mild cardiomegaly; no acute cardiopulmonary process seen.   Original Report Authenticated By: Tonia Ghent, M.D.    ROS Blood pressure 114/66, pulse 79, temperature 97.6 F (36.4 C), temperature source Oral, resp. rate 19, height 5\' 2"  (1.575 m), weight 314 lb 2.5 oz (142.5 kg), SpO2 98.00%. Physical Exam Alert and oriented. Skin warm and dry. Oral mucosa is moist.   . Sclera anicteric, conjunctivae is pink. Thyroid not enlarged. No cervical lymphadenopathy. Lungs clear. Heart regular rate and rhythm.  Abdomen is soft. Bowel sounds are positive. No hepatomegaly. No abdominal masses felt. No tenderness.  No edema to lower extremities. Rectal exam deferred. She was guaiac positive in the ED. Assessment/Plan: Lowr GI bleed.  Colonic neoplasm needs to be ruled given her family hx of colon cancer in a 1st degree relief. Hemorroidal, diverticular bleed also in the diffrential. Will discuss with Dr. Karilyn Cota.  SETZER,TERRI W 04/09/2013, 8:20 AM   GI attending note; Patient interviewed and examined. Chart reviewed. Suspect hemorrhoidal bleeding given H&H is stable. Need to rule out other etiologies. Family history significant for colon carcinoma in her  father who was in his late 83s at the time of diagnosis. Mother had breast cancer; 3 paternal uncles died of lung cancer and one paternal aunt had "stomach cancer". Patient's troponin levels are normal and she has been cleared for colonoscopy by Dr. Irene Limbo. Abdominal exam reveals mild generalized tenderness which is chronic. Patient will be prepped with NuLytely and undergo colonoscopy in a.m.

## 2013-04-09 NOTE — H&P (Signed)
[facility] History and Physical  [name]  FAO:[id number]  DOB: Mar 03, 1961   DOA: 04/09/2013   PCP:   [facility] health Department  Chief Complaint:  Bright red blood per rectum since this morning  HPI: [name] is an 52 y.o. female.  Morbidly obese Caucasian lady who is in baseline state of health until on passing stool she noticed bright red blood with clots in the toilet bowl. She has had a total of 2 episodes this morning the last 1 significant amounts of blood so she comes to the emergency room for evaluation.  In the emergency room her vitals and blood work were stable; red blood per rectum was confirmed on the hospitalist service was called to assist.   She denies diarrhea or abdominal pain; she reports that the episodes are felt left her feeling very weak  She has had previous episodes of blood on stool after passing very hard, constipated stool but she says never anything like this.  She has never had a colonoscopy  Presumptive dx of OSA in 2012 but couldn't afford sleep study.  Rewiew of Systems:   All systems negative except as marked bold or noted in the HPI;  Constitutional:    malaise, fever and chills. ;  Eyes:   eye pain, redness and discharge. ;  ENMT:   ear pain, hoarseness, nasal congestion, sinus pressure and sore throat. ;  Cardiovascular:    chest pain, palpitations, diaphoresis, dyspnea and peripheral edema.  Respiratory:   cough, hemoptysis, wheezing and stridor. ;  Gastrointestinal:  nausea, vomiting, diarrhea, constipation, lower abdominal pain, melena, blood in stool, hematemesis, jaundice and rectal bleeding. unusual weight loss..   Genitourinary:    frequency, dysuria, continous incontinence,flank pain and hematuria; Musculoskeletal:   back pain and neck pain.  swelling and trauma.;  Skin: .  pruritus, rash, abrasions, bruising and skin lesion.; ulcerations Neuro:    headache, lightheadedness and neck stiffness.  weakness, altered  level of consciousness, altered mental status, extremity weakness, burning feet, involuntary movement, seizure and syncope.  Psych:    anxiety, depression, insomnia, tearfulness, panic attacks, hallucinations, paranoia, suicidal or homicidal ideation    Past Medical History  Diagnosis Date  . GERD (gastroesophageal reflux disease)   . Hyperlipidemia   . Noncompliance   . CAD (coronary artery disease)     post PTCA with stent placement  . Morbid obesity   . Diabetes mellitus without complication   . Hypertension     Past Surgical History  Procedure Laterality Date  . Cholecystectomy    . Abdominal hysterectomy    . Ptca      WITH STENT PLACEMENT    Medications:  HOME MEDS: Prior to Admission medications   Medication Sig Start Date End Date Taking? Authorizing Provider  aspirin 325 MG tablet Take 325 mg by mouth daily.     Yes Historical Provider, MD  cyclobenzaprine (FLEXERIL) 10 MG tablet Take 5-10 mg by mouth 3 (three) times daily as needed for muscle spasms.   Yes Historical Provider, MD  metFORMIN (GLUCOPHAGE-XR) 500 MG 24 hr tablet Take 500 mg by mouth daily with breakfast.   Yes Historical Provider, MD  naproxen (NAPROSYN) 500 MG tablet Take 500 mg by mouth 2 (two) times daily with a meal.   Yes Historical Provider, MD  ranitidine (ZANTAC) 150 MG tablet Take 150 mg by mouth 2 (two) times daily.   Yes Historical Provider, MD     Allergies:  Allergies  Allergen  Reactions  . Morphine And Related Nausea And Vomiting    Social History:   reports that she has been smoking Cigarettes.  She has a 35 pack-year smoking history. She has never used smokeless tobacco. She reports that she does not drink alcohol or use illicit drugs.  Family History: Family History  Problem Relation Age of Onset  . Coronary artery disease Father      Physical Exam: Filed Vitals:   04/09/13 0031 04/09/13 0100 04/09/13 0140 04/09/13 0200  BP: 119/58 116/48  120/53  Pulse: 77 73  73   Temp:   97.2 F (36.2 C)   TempSrc:   Axillary   Resp: 20 17  14   Height:   5\' 2"  (1.575 m)   Weight:   142.5 kg (314 lb 2.5 oz)   SpO2: 93%  96% 96%   Blood pressure 120/53, pulse 73, temperature 97.2 F (36.2 C), temperature source Axillary, resp. rate 14, height 5\' 2"  (1.575 m), weight 142.5 kg (314 lb 2.5 oz), SpO2 96.00%.  GEN:  Pleasant morbidly obese Caucasian lady lying bed in no acute distress; cooperative with exam PSYCH:  alert and oriented x4;  appears somewhat depressed; affect is appropriate. HEENT: Mucous membranes pink and anicteric; PERRLA; EOM intact; no cervical lymphadenopathy nor thyromegaly or carotid bruit; no JVD; thick neck Breasts:: Not examined CHEST WALL: No tenderness CHEST: Normal respiration, clear to auscultation bilaterally HEART: Regular rate and rhythm; no murmurs rubs or gallops BACK: No kyphosis no scoliosis; no CVA tenderness ABDOMEN: Obese, soft non-tender; no masses, no organomegaly, normal abdominal bowel sounds;  Rectal Exam: Not done EXTREMITIES:  age-appropriate arthropathy of the hands and knees; no edema; no ulcerations. Genitalia: not examined PULSES: 2+ and symmetric SKIN: Normal hydration no rash or ulceration CNS: Cranial nerves 2-12 grossly intact no focal lateralizing neurologic deficit   Labs on Admission:  Basic Metabolic Panel:  Recent Labs Lab 04/08/13 2200  NA 142  K 3.7  CL 105  CO2 27  GLUCOSE 107*  BUN 17  CREATININE 1.03  CALCIUM 9.8   Liver Function Tests:  Recent Labs Lab 04/08/13 2200  AST 22  ALT 23  ALKPHOS 71  BILITOT 0.2*  PROT 6.8  ALBUMIN 4.1   No results found for this basename: LIPASE, AMYLASE,  in the last 168 hours No results found for this basename: AMMONIA,  in the last 168 hours CBC:  Recent Labs Lab 04/08/13 2200  WBC 8.3  NEUTROABS 3.7  HGB 13.3  HCT 38.8  MCV 97.7  PLT 151   Cardiac Enzymes: No results found for this basename: CKTOTAL, CKMB, CKMBINDEX, TROPONINI,  in  the last 168 hours BNP: No components found with this basename: POCBNP,  D-dimer: No components found with this basename: D-DIMER,  CBG: No results found for this basename: GLUCAP,  in the last 168 hours  Radiological Exams on Admission: Dg Chest Portable 1 View  04/08/2013   *RADIOLOGY REPORT*  Clinical Data: Chest pain, shortness of breath and rectal bleeding. Weakness and abdominal pain.  PORTABLE CHEST - 1 VIEW  Comparison: Chest radiograph performed 05/30/2011  Findings: The lungs are well-aerated and clear.  There is no evidence of focal opacification, pleural effusion or pneumothorax.  The heart is mildly enlarged.  No acute osseous abnormalities are seen.  IMPRESSION: Mild cardiomegaly; no acute cardiopulmonary process seen.   Original Report Authenticated By: Tonia Ghent, M.D.      Assessment/Plan   Principal Problem:   Lower GI bleed Active  Problems:   Coronary artery disease   GERD (gastroesophageal reflux disease)   Hyperlipidemia LDL goal < 100   Generalized anxiety disorder   Tobacco use disorder   Sleep apnea   Morbid obesity  diabetes type 2   PLAN: Will admit this lady for monitoring of the hemoglobin, monitoring of the bleeding, and give her the benefit of a gastroenterologic consult especially in the light of the fact that she's never had a colonoscopy. Will continue management of her chronic medical conditions, while she is on a clear liquid diet  We'll hold metformin and give a moderate sliding scale insulin while on a clear liquid  Counsel on the importance of nicotine cessation, and weight management for resolution of other chronic medical problems, such as diabetes, obstructive sleep apnea, and possible hyperlipidemia  Other plans as per orders.  Code Status:  Full code  Family Communication: Lungs discussed with patient at bedside Disposition Plan: And eventually after GI evaluation, and resolution of  bleeding.    Dolce Sylvia Nocturnist [facility] Pager [phone]   04/09/2013, 2:47 AM

## 2013-04-09 NOTE — Progress Notes (Signed)
UR chart review completed.  

## 2013-04-09 NOTE — Progress Notes (Signed)
[facility] PROGRESS NOTE  [name] WUJ:[id number] DOB: Jul 27, 1961 DOA: 04/08/2013 PCP: Selinda Flavin, MD  Assessment/Plan: Lower GI bleed: Etiology uncertain. PT with NSAID use and family hx of colon cancer. Pt never had colonoscopy.  Appreciate GI assistance. Defer to GI for diet/workup.  HG stable. No further episodes of BRBPR. Monitor CBC.  Active Problems:   Coronary artery disease : s/p stents. Complained chest "pressure" on admission that was relieved with NTG. No further episodes reported. Tele with NSR. Troponin negx1. Will cycle troponin. TSH pending. Check lipid panel. No asa given #1. Hx  cardiolite study neg 2012.   GERD (gastroesophageal reflux disease) : PPI   Hyperlipidemia LDL goal < 100 : check lipid panel. Not on statin  Generalized anxiety disorder: stable at baseline   Tobacco use disorder: cessation counseling    Morbid obesity : BMI 57.6. Nutritional consult.   Sleep apnea: sleep study scheduled 2012 but pt did not show according to chart.   diabetes type 2: oral agents and diet controlled. Will hold oral agent for now. Check A1c. Use SSI for glycemic control.   Thrombocytopenia: chart review indicates hx of same. No documentation of work up. Current level higher than in past. Will monitor.    Code Status: full Family Communication:  Disposition Plan: home when ready hopefully tomorrow   Consultants:  GI  Procedures:  none  Antibiotics:  none  HPI/Subjective: Lying in bed on phone. Denies pain/discomfort  Objective: Filed Vitals:   04/09/13 0530 04/09/13 0545 04/09/13 0600 04/09/13 0750  BP: 107/65 115/52 114/66   Pulse:   79   Temp:    97.6 F (36.4 C)  TempSrc:    Oral  Resp: 32 18 19   Height:      Weight:      SpO2:   98%     Intake/Output Summary (Last 24 hours) at 04/09/13 1001 Last data filed at 04/09/13 0850  Gross per 24 hour  Intake    520 ml  Output      0 ml  Net    520 ml   Filed Weights   04/08/13  2127 04/09/13 0140 04/09/13 0500  Weight: 141.522 kg (312 lb) 142.5 kg (314 lb 2.5 oz) 142.5 kg (314 lb 2.5 oz)    Exam:   General:  Obese NAD  Cardiovascular: RRR No MGR No LE edema  Respiratory: normal effort BS clear bilaterally no wheeze, no rhonchi  Abdomen: obese soft +BS mild tenderness to palpation lower right quadrant otherwise non-tender.   Musculoskeletal: no clubbing no cyanosis  Data Reviewed: Basic Metabolic Panel:  Recent Labs Lab 04/08/13 2200 04/09/13 0450  NA 142 142  K 3.7 3.6  CL 105 105  CO2 27 27  GLUCOSE 107* 99  BUN 17 18  CREATININE 1.03 1.05  CALCIUM 9.8 9.2   Liver Function Tests:  Recent Labs Lab 04/08/13 2200 04/09/13 0450  AST 22 40*  ALT 23 30  ALKPHOS 71 72  BILITOT 0.2* 0.3  PROT 6.8 6.5  ALBUMIN 4.1 3.9   No results found for this basename: LIPASE, AMYLASE,  in the last 168 hours No results found for this basename: AMMONIA,  in the last 168 hours CBC:  Recent Labs Lab 04/08/13 2200 04/09/13 0450  WBC 8.3 6.6  NEUTROABS 3.7  --   HGB 13.3 12.2  HCT 38.8 36.6  MCV 97.7 98.7  PLT 151 139*   Cardiac Enzymes:  Recent Labs Lab 04/09/13 0450  TROPONINI <  0.30   BNP (last 3 results) No results found for this basename: PROBNP,  in the last 8760 hours CBG:  Recent Labs Lab 04/09/13 0741  GLUCAP 100*    Recent Results (from the past 240 hour(s))  MRSA PCR SCREENING     Status: None   Collection Time    04/09/13  2:13 AM      Result Value Range Status   MRSA by PCR NEGATIVE  NEGATIVE Final   Comment:            The GeneXpert MRSA Assay (FDA     approved for NASAL specimens     only), is one component of a     comprehensive MRSA colonization     surveillance program. It is not     intended to diagnose MRSA     infection nor to guide or     monitor treatment for     MRSA infections.     Studies: Dg Chest Portable 1 View  04/08/2013   *RADIOLOGY REPORT*  Clinical Data: Chest pain, shortness of breath  and rectal bleeding. Weakness and abdominal pain.  PORTABLE CHEST - 1 VIEW  Comparison: Chest radiograph performed 05/30/2011  Findings: The lungs are well-aerated and clear.  There is no evidence of focal opacification, pleural effusion or pneumothorax.  The heart is mildly enlarged.  No acute osseous abnormalities are seen.  IMPRESSION: Mild cardiomegaly; no acute cardiopulmonary process seen.   Original Report Authenticated By: Tonia Ghent, M.D.    Scheduled Meds: . sodium chloride   Intravenous STAT  . insulin aspart  0-15 Units Subcutaneous TID WC  . insulin aspart  0-5 Units Subcutaneous QHS  . pantoprazole  40 mg Oral BID  . [START ON 04/10/2013] pneumococcal 23 valent vaccine  0.5 mL Intramuscular Tomorrow-1000   Continuous Infusions:   Principal Problem:   Lower GI bleed Active Problems:   Coronary artery disease   GERD (gastroesophageal reflux disease)   Hyperlipidemia LDL goal < 100   Generalized anxiety disorder   Tobacco use disorder   Sleep apnea   Morbid obesity   Diabetes   Thrombocytopenia, unspecified    Time spent: 45 minutes    [facility] M  [facility] Pager [phone]. If 7PM-7AM, please contact night-coverage at [email or web address], password [facility] 04/09/2013, 10:01 AM  LOS: 1 day

## 2013-04-09 NOTE — Care Management Note (Signed)
    Page 1 of 1   04/10/2013     2:44:48 PM   CARE MANAGEMENT NOTE 04/10/2013  Patient:  [name], [name]   Account Number:  [id number]  Date Initiated:  04/09/2013  Documentation initiated by:  Rosemary Holms  Subjective/Objective Assessment:   Pt admitted from home where she lives with her spouse and 54 yo g'son. Pt has a Rx from [facility] for numberous meds. Advised Black NP of RX slip and for CM to assist, these meds would need to be ordered at DC.     Action/Plan:   Anticipated DC Date:  04/11/2013   Anticipated DC Plan:  HOME/SELF CARE      DC Planning Services  CM consult      Choice offered to / List presented to:             Status of service:  Completed, signed off Medicare Important Message given?   (If response is "NO", the following Medicare IM given date fields will be blank) Date Medicare IM given:   Date Additional Medicare IM given:    Discharge Disposition:  HOME/SELF CARE  Per UR Regulation:    If discussed at Long Length of Stay Meetings, dates discussed:    Comments:   04/10/13 1442 Arlyss Queen, RN BSN CM Pt discharged home today. No CM needs noted. 04/09/13 Amy Robson RN BSN CM Spoke to [employer] regarding pt. No Medicaid ap pending. She will assist pt. Will try to assist with MATCH program at DC. Requested Byrd Hesselbach, Diabetic coordinator to speak with pt regarding meter for BS

## 2013-04-10 ENCOUNTER — Encounter (HOSPITAL_COMMUNITY)
Admission: EM | Disposition: A | Discharge status: Home / Self Care | Payer: Self-pay | Source: Home / Self Care | Attending: Family Medicine

## 2013-04-10 DIAGNOSIS — K644 Residual hemorrhoidal skin tags: Principal | ICD-10-CM

## 2013-04-10 DIAGNOSIS — K6389 Other specified diseases of intestine: Secondary | ICD-10-CM

## 2013-04-10 HISTORY — PX: COLONOSCOPY: SHX5424

## 2013-04-10 LAB — HEMOGLOBIN AND HEMATOCRIT, BLOOD
HCT: 36.3 % (ref 36.0–46.0)
Hemoglobin: 12.7 g/dL (ref 12.0–15.0)

## 2013-04-10 LAB — GLUCOSE, CAPILLARY
Glucose-Capillary: 100 mg/dL — ABNORMAL HIGH (ref 70–99)
Glucose-Capillary: 97 mg/dL (ref 70–99)

## 2013-04-10 SURGERY — COLONOSCOPY
Anesthesia: Moderate Sedation

## 2013-04-10 MED ORDER — MEPERIDINE HCL 50 MG/ML IJ SOLN
INTRAMUSCULAR | Status: AC
  Filled 2013-04-10: qty 1

## 2013-04-10 MED ORDER — NAPROXEN 500 MG PO TABS: 250.0000 mg | ORAL_TABLET | Freq: Two times a day (BID) | ORAL | Status: DC

## 2013-04-10 MED ORDER — MIDAZOLAM HCL 5 MG/5ML IJ SOLN
INTRAMUSCULAR | Status: DC | PRN
  Administered 2013-04-10 (×5): 2 mg via INTRAVENOUS

## 2013-04-10 MED ORDER — HYDROCORTISONE ACETATE 25 MG RE SUPP
25.0000 mg | Freq: Every day | RECTAL | Status: DC
Start: 2013-04-10 — End: 2013-04-10
  Filled 2013-04-10: qty 1

## 2013-04-10 MED ORDER — MIDAZOLAM HCL 5 MG/5ML IJ SOLN
INTRAMUSCULAR | Status: AC
  Filled 2013-04-10: qty 10

## 2013-04-10 MED ORDER — MEPERIDINE HCL 50 MG/ML IJ SOLN
INTRAMUSCULAR | Status: DC | PRN
  Administered 2013-04-10 (×2): 25 mg via INTRAVENOUS

## 2013-04-10 MED ORDER — HYDROCORTISONE ACETATE 25 MG RE SUPP: 25.0000 mg | Freq: Every day | RECTAL | Status: DC

## 2013-04-10 MED ORDER — SODIUM CHLORIDE 0.9 % IV SOLN
INTRAVENOUS | Status: DC
  Administered 2013-04-10: 1000 mL via INTRAVENOUS

## 2013-04-10 NOTE — Discharge Summary (Signed)
Physician Discharge Summary  [name] WJX:[id number] DOB: Aug 14, 1961 DOA: 04/08/2013  PCP: Selinda Flavin, MD  Admit date: 04/08/2013 Discharge date: 04/10/2013  Time spent: 40 minutes  Recommendations for Outpatient Follow-up:  1. Follow up PCP 1-2 weeks for evaluation of symptoms and evaluation glycemic control.  2. Colonoscopy 5 years  Discharge Diagnoses:  Principal Problem:   External bleeding hemorrhoids Active Problems:   Coronary artery disease   GERD (gastroesophageal reflux disease)   Hyperlipidemia LDL goal < 100   Generalized anxiety disorder   Tobacco use disorder   Sleep apnea   Lower GI bleed   Morbid obesity   Diabetes   Thrombocytopenia, unspecified   Chest pain   Discharge Condition: stabel  Diet recommendation: carb modified  Filed Weights   04/09/13 0140 04/09/13 0500 04/10/13 0414  Weight: 142.5 kg (314 lb 2.5 oz) 142.5 kg (314 lb 2.5 oz) 145.786 kg (321 lb 6.4 oz)    History of present illness:  [name] is an 51 y.o. Morbidly obese Caucasian lady who is in baseline state of health until on passing stool on 04/09/13 she noticed bright red blood with clots in the toilet bowl. She had had a total of 2 episodes on the day of admission the last 1 significant amounts of blood so she presented to the emergency room for evaluation. In the emergency room her vitals and blood work were stable; red blood per rectum was confirmed and hospitalist service was called to assist.  She denied diarrhea or abdominal pain; she reported that the episodes  left her feeling very weak .She reported previous episodes of blood on stool after passing very hard, constipated stool but she says never anything like this. She has never had a colonoscopy.Presumptive dx of OSA in 2012 but couldn't afford sleep study   Hospital Course:  Lower GI bleed: In patient with NSAID use and family hx of colon cancer. Related to external hemorrhoids. Pt admitted to tele and GI consulted.  Colonoscopy 04/10/13 yielding external hemorrhoids otherwise normal. GI recommending Anusol suppository at bedtime for 2 weeks. Colonoscopy in 5 years. HG remained stable during hospitalization. No further episodes of BRBPR noted.   Active Problems:  Coronary artery disease : s/p stents. Complained chest "pressure" on admission that was relieved with NTG. No further episodes reported. Tele with NSR. Troponin negx3.  She also had several weeks ago a myocardial perfusion study done which showed normal LV perfusion and ejection fraction of 62%". This apparently was performed at [facility]. Last left heart catheterization on file 07/2007: Mild diffuse nonobstructive coronary artery disease. Normal left ventricular systolic function. No evidence of pulmonary hypertension. Medical management recommended. Resume asa at discharge.   GERD (gastroesophageal reflux disease) : continue Zantac   Generalized anxiety disorder:  Remained stable at baseline   Tobacco use disorder: cessation counseling   Morbid obesity : BMI 57.6. Nutritional consult.   Sleep apnea: sleep study scheduled 2012 but pt did not show according to chart.   diabetes type 2: oral agents and diet controlled. A1c 5.8. Resume oral agents at discharge   Thrombocytopenia: chart review indicates hx of same. No documentation of work up. Current level higher than in past. Recommend OP follow up   Procedures:  Colonoscopy 04/10/13  Consultations:  GI  Discharge Exam: Filed Vitals:   04/10/13 0820 04/10/13 0825 04/10/13 0830 04/10/13 0856  BP: 126/69 98/76 124/71   Pulse: 80 80 78   Temp:      TempSrc:  Resp: 24 22 22 20   Height:      Weight:      SpO2: 93% 93% 97% 92%    General: obese NAD Cardiovascular: RRR No MGR No LE edema Respiratory: normal effort BS clear bilaterally no wheeze no rhonchi Abdomen soft +BS non-tender to palpation  Discharge Instructions  Discharge Orders   Future Orders Complete By Expires     Diet  - low sodium heart healthy  As directed     Discharge instructions  As directed     Comments:      Follow up with PCP 4 weeks for evaluation thyroid function Recommend decreasing dose of Naprosyn Anusol suppository at bedtime for 2 weeks    Increase activity slowly  As directed         Medication List    TAKE these medications       aspirin 325 MG tablet  Take 325 mg by mouth daily.     cyclobenzaprine 10 MG tablet  Commonly known as:  FLEXERIL  Take 5-10 mg by mouth 3 (three) times daily as needed for muscle spasms.     hydrocortisone 25 MG suppository  Commonly known as:  ANUSOL-HC  Place 1 suppository (25 mg total) rectally at bedtime.     metFORMIN 500 MG 24 hr tablet  Commonly known as:  GLUCOPHAGE-XR  Take 500 mg by mouth daily with breakfast.     naproxen 500 MG tablet  Commonly known as:  NAPROSYN  Take 0.5 tablets (250 mg total) by mouth 2 (two) times daily with a meal.     ranitidine 150 MG tablet  Commonly known as:  ZANTAC  Take 150 mg by mouth 2 (two) times daily.       Allergies  Allergen Reactions  . Morphine And Related Nausea And Vomiting       Follow-up Information   Follow up with MUSE,ROCHELLE D., PA-C. Schedule an appointment as soon as possible for a visit on 05/09/2013. (@10AM  follow up on thyroid function)    Contact information:   [address] [place] [address] [phone]        The results of significant diagnostics from this hospitalization (including imaging, microbiology, ancillary and laboratory) are listed below for reference.    Significant Diagnostic Studies: Dg Chest Portable 1 View  04/08/2013   *RADIOLOGY REPORT*  Clinical Data: Chest pain, shortness of breath and rectal bleeding. Weakness and abdominal pain.  PORTABLE CHEST - 1 VIEW  Comparison: Chest radiograph performed 05/30/2011  Findings: The lungs are well-aerated and clear.  There is no evidence of focal opacification, pleural effusion or pneumothorax.  The heart is  mildly enlarged.  No acute osseous abnormalities are seen.  IMPRESSION: Mild cardiomegaly; no acute cardiopulmonary process seen.   Original Report Authenticated By: Tonia Ghent, M.D.    Microbiology: Recent Results (from the past 240 hour(s))  MRSA PCR SCREENING     Status: None   Collection Time    04/09/13  2:13 AM      Result Value Range Status   MRSA by PCR NEGATIVE  NEGATIVE Final   Comment:            The GeneXpert MRSA Assay (FDA     approved for NASAL specimens     only), is one component of a     comprehensive MRSA colonization     surveillance program. It is not     intended to diagnose MRSA     infection nor to  guide or     monitor treatment for     MRSA infections.     Labs: Basic Metabolic Panel:  Recent Labs Lab 04/08/13 2200 04/09/13 0450  NA 142 142  K 3.7 3.6  CL 105 105  CO2 27 27  GLUCOSE 107* 99  BUN 17 18  CREATININE 1.03 1.05  CALCIUM 9.8 9.2   Liver Function Tests:  Recent Labs Lab 04/08/13 2200 04/09/13 0450  AST 22 40*  ALT 23 30  ALKPHOS 71 72  BILITOT 0.2* 0.3  PROT 6.8 6.5  ALBUMIN 4.1 3.9   No results found for this basename: LIPASE, AMYLASE,  in the last 168 hours No results found for this basename: AMMONIA,  in the last 168 hours CBC:  Recent Labs Lab 04/08/13 2200 04/09/13 0450 04/09/13 1134 04/09/13 1947 04/10/13 0358  WBC 8.3 6.6  --   --   --   NEUTROABS 3.7  --   --   --   --   HGB 13.3 12.2 13.2 13.7 12.7  HCT 38.8 36.6 39.2 39.2 36.3  MCV 97.7 98.7  --   --   --   PLT 151 139*  --   --   --    Cardiac Enzymes:  Recent Labs Lab 04/09/13 0450 04/09/13 1134  TROPONINI <0.30 <0.30   BNP: BNP (last 3 results) No results found for this basename: PROBNP,  in the last 8760 hours CBG:  Recent Labs Lab 04/09/13 0741 04/09/13 1141 04/09/13 1635 04/09/13 2152 04/10/13 0730  GLUCAP 100* 95 101* 101* 100*       Signed:  Kameshia Madruga M  [facility] 04/10/2013, 11:53 AM

## 2013-04-10 NOTE — Progress Notes (Addendum)
Nursing staff informed diabetes coordinator that patient had issues with glucometer.  In talking with the patient, she reports that she does not have a working glucometer or testing supplies to monitor blood glucose at home.  She reports that she does not have any insurance and she is not able to afford purchasing a meter and testing supplies.  Provided information on the most affordable glucometer - Reli-On at [facility] which is approximately $16 for the meter and a box of 50 strips is approximately $9 a box.  According to the patient she can not afford to purchase the Reli-On meter.  Patient reports that she goes to the health department for medical care and they provide her with prescriptions for her oral diabetic medications.  Called [facility] Department to inquire about resources that may be able to provide patient with a glucometer and testing supplies.  Was directed to Alecia Lemming; however, she was not in the office so a message was left requesting that she call patient Tuesday morning here at [facility].  Updated the patient about information and provided her with contact information for Alecia Lemming at [facility] Department.    Thanks, Orlando Penner, RN, MSN, CCRN Diabetes Coordinator Inpatient Diabetes Program [phone]    04/10/13@10 :6 - Talked with patient and she reports that Alecia Lemming from the [facility] Department did call her back this morning but they do not have any resources to supply her with a glucometer and testing supplies.  Patient's husband and son in the room with the patient.  Patient and her husband state they will work something out so they can purchase the Reli-On glucometer and testing strips at [employer].  Patient states she is appreciative of staff trying to assist her in meeting her needs. Thanks, Orlando Penner, RN, MSN, CCRN Diabetes Coordinator Inpatient Diabetes Program [phone]

## 2013-04-10 NOTE — Discharge Summary (Signed)
Patient seen and agree with discharge. See my progress note same date. Note that her nuclear medicine study at [facility] was in 2012. He was counseled to minimize use of naproxen  Brendia Sacks, MD [facility] [phone]

## 2013-04-10 NOTE — Progress Notes (Signed)
Patient given discharge instructions with no questions. Prescriptions given. Patient taken out of facility by staff via wheelchair.

## 2013-04-10 NOTE — Progress Notes (Signed)
[facility]  [name] WUJ:[id number] DOB: 1961-04-24 DOA: 04/08/2013 PCP: Selinda Flavin, MD  Interval history/Subjective: Underwent lower endoscopy today which demonstrated external hemorrhoids that the etiology of bleeding. She has had no further bleeding, no chest pain, feels well and was like to go home.  Objective: Afebrile, vitals stable. Appears calm and comfortable. Speech fluent and clear. Cardiovascular regular rate and rhythm. Respiratory clear to auscultation bilaterally.  Labs/studies: Capillary blood sugars well controlled. Hemoglobin stable 12.7. Hemoglobin A1c 5.8. TSH elevated.  Assessment:  Rectal bleeding, secondary to external hemorrhoids, without anemia  Atypical chest pain   History of coronary artery disease, see previous note 6/2 for details  Elevated TSH  Plan:  Per GI: Anusol-HC suppository 1 per rectum daily at bedtime for 2 weeks, Patient advised to drop naproxen dose if possible, Next colonoscopy in 5 years.  Followup elevated TSH as an outpatient in approximately 4 weeks treatment deferred for now  Continue treatment for GERD  Discharge home today   Summary:  52 year old woman with presented with bright red blood per rectum. Also had an episode of atypical chest pain.she was admitted for monitoring of her hemoglobin, GI consultation and cardiac rule out.   Brendia Sacks, MD [facility] [phone]

## 2013-04-10 NOTE — Op Note (Signed)
COLONOSCOPY PROCEDURE REPORT  PATIENT:  [name]  MR#:  [id number] Birthdate:  02/11/1961, 52 y.o., female Endoscopist:  Dr. Malissa Hippo, MD Procedure Date: 04/10/2013  Procedure:   Colonoscopy  Indications:  Patient is 52 year old Caucasian female who presents with rectal bleeding. Her H&H has remained stable. Family history significant for colon carcinoma father was in his late 60s at the time of diagnosis.  Informed Consent:  The procedure and risks were reviewed with the patient and informed consent was obtained.  Medications:  Demerol 50 mg IV Versed 10 mg IV  Description of procedure:  After a digital rectal exam was performed, that colonoscope was advanced from the anus through the rectum and colon to the area of the cecum, ileocecal valve and appendiceal orifice. The cecum was deeply intubated. These structures were well-seen and photographed for the record. From the level of the cecum and ileocecal valve, the scope was slowly and cautiously withdrawn. The mucosal surfaces were carefully surveyed utilizing scope tip to flexion to facilitate fold flattening as needed. The scope was pulled down into the rectum where a thorough exam including retroflexion was performed.  Findings:  Prep satisfactory. Lipomatous ileocecal valve. It was palpated with forceps. Normal mucosa of area segments of colon. Normal rectal mucosa. Small hemorrhoids below the dentate line.   Therapeutic/Diagnostic Maneuvers Performed:  None  Complications:  None  Cecal Withdrawal Time:  7 minutes  Impression:  Examination performed to cecum. External hemorrhoids otherwise normal examination.  Recommendations:  Advance diet.  Anusol-HC suppository 1 per rectum daily at bedtime for 2 weeks. Patient advised to drop naproxen dose if possible. Next colonoscopy in 5 years.  Danette Weinfeld U  04/10/2013 8:28 AM  CC: Dr. Selinda Flavin, MD & Dr. Bonnetta Barry ref. provider found

## 2013-04-13 ENCOUNTER — Encounter (HOSPITAL_COMMUNITY): Payer: Self-pay | Admitting: Internal Medicine

## 2013-05-15 ENCOUNTER — Other Ambulatory Visit (HOSPITAL_COMMUNITY): Payer: Self-pay | Admitting: Nurse Practitioner

## 2013-05-15 DIAGNOSIS — Z09 Encounter for follow-up examination after completed treatment for conditions other than malignant neoplasm: Secondary | ICD-10-CM

## 2013-05-23 ENCOUNTER — Encounter (HOSPITAL_COMMUNITY): Payer: Self-pay

## 2013-06-06 ENCOUNTER — Ambulatory Visit (HOSPITAL_COMMUNITY)
Admission: RE | Admit: 2013-06-06 | Discharge: 2013-06-06 | Disposition: A | Discharge status: Home / Self Care | Payer: PRIVATE HEALTH INSURANCE | Source: Ambulatory Visit | Attending: Nurse Practitioner | Admitting: Nurse Practitioner

## 2013-06-06 DIAGNOSIS — N63 Unspecified lump in unspecified breast: Secondary | ICD-10-CM | POA: Insufficient documentation

## 2013-06-06 DIAGNOSIS — Z09 Encounter for follow-up examination after completed treatment for conditions other than malignant neoplasm: Secondary | ICD-10-CM | POA: Insufficient documentation

## 2013-08-03 ENCOUNTER — Emergency Department (HOSPITAL_COMMUNITY)
Admission: EM | Admit: 2013-08-03 | Discharge: 2013-08-03 | Disposition: A | Discharge status: Home / Self Care | Payer: Medicaid Other | Attending: Emergency Medicine | Admitting: Emergency Medicine

## 2013-08-03 ENCOUNTER — Emergency Department (HOSPITAL_COMMUNITY): Payer: Medicaid Other

## 2013-08-03 ENCOUNTER — Encounter (HOSPITAL_COMMUNITY): Payer: Self-pay

## 2013-08-03 DIAGNOSIS — R519 Headache, unspecified: Secondary | ICD-10-CM

## 2013-08-03 DIAGNOSIS — I2510 Atherosclerotic heart disease of native coronary artery without angina pectoris: Secondary | ICD-10-CM | POA: Insufficient documentation

## 2013-08-03 DIAGNOSIS — R209 Unspecified disturbances of skin sensation: Secondary | ICD-10-CM | POA: Insufficient documentation

## 2013-08-03 DIAGNOSIS — Z7982 Long term (current) use of aspirin: Secondary | ICD-10-CM | POA: Insufficient documentation

## 2013-08-03 DIAGNOSIS — E119 Type 2 diabetes mellitus without complications: Secondary | ICD-10-CM | POA: Insufficient documentation

## 2013-08-03 DIAGNOSIS — K219 Gastro-esophageal reflux disease without esophagitis: Secondary | ICD-10-CM | POA: Insufficient documentation

## 2013-08-03 DIAGNOSIS — I10 Essential (primary) hypertension: Secondary | ICD-10-CM | POA: Insufficient documentation

## 2013-08-03 DIAGNOSIS — R109 Unspecified abdominal pain: Secondary | ICD-10-CM | POA: Insufficient documentation

## 2013-08-03 DIAGNOSIS — Z791 Long term (current) use of non-steroidal anti-inflammatories (NSAID): Secondary | ICD-10-CM | POA: Insufficient documentation

## 2013-08-03 DIAGNOSIS — R51 Headache: Secondary | ICD-10-CM | POA: Insufficient documentation

## 2013-08-03 DIAGNOSIS — R200 Anesthesia of skin: Secondary | ICD-10-CM

## 2013-08-03 DIAGNOSIS — R300 Dysuria: Secondary | ICD-10-CM | POA: Insufficient documentation

## 2013-08-03 DIAGNOSIS — F17200 Nicotine dependence, unspecified, uncomplicated: Secondary | ICD-10-CM | POA: Insufficient documentation

## 2013-08-03 DIAGNOSIS — R110 Nausea: Secondary | ICD-10-CM | POA: Insufficient documentation

## 2013-08-03 DIAGNOSIS — Z9861 Coronary angioplasty status: Secondary | ICD-10-CM | POA: Insufficient documentation

## 2013-08-03 DIAGNOSIS — Z79899 Other long term (current) drug therapy: Secondary | ICD-10-CM | POA: Insufficient documentation

## 2013-08-03 LAB — CBC
HCT: 39.9 % (ref 36.0–46.0)
Hemoglobin: 13.6 g/dL (ref 12.0–15.0)
MCH: 33.9 pg (ref 26.0–34.0)
MCHC: 34.1 g/dL (ref 30.0–36.0)
MCV: 99.5 fL (ref 78.0–100.0)
Platelets: 165 10*3/uL (ref 150–400)
RBC: 4.01 MIL/uL (ref 3.87–5.11)
RDW: 15.2 % (ref 11.5–15.5)
WBC: 6.5 10*3/uL (ref 4.0–10.5)

## 2013-08-03 LAB — COMPREHENSIVE METABOLIC PANEL
ALT: 18 U/L (ref 0–35)
AST: 17 U/L (ref 0–37)
Albumin: 4.1 g/dL (ref 3.5–5.2)
Alkaline Phosphatase: 66 U/L (ref 39–117)
BUN: 10 mg/dL (ref 6–23)
CO2: 27 mEq/L (ref 19–32)
Calcium: 9.8 mg/dL (ref 8.4–10.5)
Chloride: 104 mEq/L (ref 96–112)
Creatinine, Ser: 1.1 mg/dL (ref 0.50–1.10)
GFR calc Af Amer: 65 mL/min — ABNORMAL LOW (ref >90)
GFR calc non Af Amer: 56 mL/min — ABNORMAL LOW (ref >90)
Glucose, Bld: 113 mg/dL — ABNORMAL HIGH (ref 70–99)
Potassium: 3.3 mEq/L — ABNORMAL LOW (ref 3.5–5.1)
Sodium: 143 mEq/L (ref 135–145)
Total Bilirubin: 0.2 mg/dL — ABNORMAL LOW (ref 0.3–1.2)
Total Protein: 7.4 g/dL (ref 6.0–8.3)

## 2013-08-03 LAB — DIFFERENTIAL
Basophils Absolute: 0 10*3/uL (ref 0.0–0.1)
Basophils Relative: 1 % (ref 0–1)
Eosinophils Absolute: 0.4 10*3/uL (ref 0.0–0.7)
Eosinophils Relative: 6 % — ABNORMAL HIGH (ref 0–5)
Lymphocytes Relative: 39 % (ref 12–46)
Lymphs Abs: 2.5 10*3/uL (ref 0.7–4.0)
Monocytes Absolute: 0.4 10*3/uL (ref 0.1–1.0)
Monocytes Relative: 6 % (ref 3–12)
Neutro Abs: 3.2 10*3/uL (ref 1.7–7.7)
Neutrophils Relative %: 49 % (ref 43–77)

## 2013-08-03 LAB — URINALYSIS, ROUTINE W REFLEX MICROSCOPIC
Bilirubin Urine: NEGATIVE
Glucose, UA: NEGATIVE mg/dL
Hgb urine dipstick: NEGATIVE
Ketones, ur: NEGATIVE mg/dL
Leukocytes, UA: NEGATIVE
Nitrite: NEGATIVE
Protein, ur: NEGATIVE mg/dL
Specific Gravity, Urine: 1.01 (ref 1.005–1.030)
Urobilinogen, UA: 0.2 mg/dL (ref 0.0–1.0)
pH: 6 (ref 5.0–8.0)

## 2013-08-03 LAB — GLUCOSE, CAPILLARY: Glucose-Capillary: 91 mg/dL (ref 70–99)

## 2013-08-03 MED ORDER — SODIUM CHLORIDE 0.9 % IV BOLUS (SEPSIS)
1000.0000 mL | Freq: Once | INTRAVENOUS | Status: AC
  Administered 2013-08-03: 1000 mL via INTRAVENOUS

## 2013-08-03 MED ORDER — LEVOFLOXACIN IN D5W 500 MG/100ML IV SOLN
INTRAVENOUS | Status: AC
  Filled 2013-08-03: qty 100

## 2013-08-03 MED ORDER — METOCLOPRAMIDE HCL 5 MG/ML IJ SOLN
5.0000 mg | Freq: Once | INTRAMUSCULAR | Status: AC
  Administered 2013-08-03: 5 mg via INTRAVENOUS
  Filled 2013-08-03: qty 2

## 2013-08-03 MED ORDER — DIPHENHYDRAMINE HCL 50 MG/ML IJ SOLN
25.0000 mg | Freq: Once | INTRAMUSCULAR | Status: AC
  Administered 2013-08-03: 25 mg via INTRAVENOUS
  Filled 2013-08-03: qty 1

## 2013-08-03 NOTE — ED Notes (Signed)
Pt reports left forehead numbness for 2-3 months, the numbness has spread to her left cheek and mouth area since yesterday and now has a headache. Denies any problems w/ movement or walking, no problems swallowing.

## 2013-08-03 NOTE — ED Provider Notes (Signed)
CSN: [id number]     Arrival date & time 08/03/13  1221 History  This chart was scribed for Shon Baton, MD by Shari Heritage, ED Scribe. The patient was seen in room APA14/APA14. Patient's care was started at 1:33 PM.    Chief Complaint  Patient presents with  . Numbness  . Headache    The history is provided by the patient. No language interpreter was used.    HPI Comments: [name] is a 52 y.o. female who presents to the Emergency Department complaining of a constant left facial and frontal headache onset 2 days ago. She describes pain as pressure and reports that it has been worsening for the past day. She currently rates pain as 10/10. Pain is worse with exposure to bright light. She has been taking Excedrin Migraines which provides mild relief. There is associated nausea. She is also complaining of numbness to her left forehead for the past 2-3 months. She was supposed to have cataract surgery on her right eye, but it could not be completed because the surgeon saw "something" during the procedure. He recommended that patient have an MRI and LP done. Patient's other symptoms today include abdominal pain that started yesterday and bladder leaking (but this is a recurrent problem). Patient denies asphasia, slurred speech, chest pain, shortness of breath or any other symtpoms.  She denies any increased difficulty ambulating. She has a medical history of GERD, hyperlipidemia, CAD, DM and HTN.   Ophthalmologist - [facility]  Past Medical History  Diagnosis Date  . GERD (gastroesophageal reflux disease)   . Hyperlipidemia   . Noncompliance   . CAD (coronary artery disease)     post PTCA with stent placement  . Morbid obesity   . Diabetes mellitus without complication   . Hypertension    Past Surgical History  Procedure Laterality Date  . Cholecystectomy    . Abdominal hysterectomy    . Ptca      WITH STENT PLACEMENT  . Colonoscopy N/A 04/10/2013    Procedure:  COLONOSCOPY;  Surgeon: Malissa Hippo, MD;  Location: [facility];  Service: Endoscopy;  Laterality: N/A;   Family History  Problem Relation Age of Onset  . Coronary artery disease Father   . Cancer - Colon Father    History  Substance Use Topics  . Smoking status: Current Every Day Smoker -- 1.00 packs/day for 35 years    Types: Cigarettes  . Smokeless tobacco: Never Used  . Alcohol Use: No   OB History   Grav Para Term Preterm Abortions TAB SAB Ect Mult Living                 Review of Systems  Constitutional: Negative for fever.  Respiratory: Negative for cough, chest tightness and shortness of breath.   Cardiovascular: Negative for chest pain.  Gastrointestinal: Positive for nausea and abdominal pain. Negative for vomiting.  Genitourinary: Positive for difficulty urinating (history of bladder leaking). Negative for dysuria and hematuria.  Musculoskeletal: Negative for back pain.  Skin: Negative for wound.  Neurological: Positive for numbness and headaches. Negative for dizziness.  Psychiatric/Behavioral: Negative for confusion.  All other systems reviewed and are negative.    Allergies  Morphine and related  Home Medications   Current Outpatient Rx  Name  Route  Sig  Dispense  Refill  . aspirin 325 MG tablet   Oral   Take 325 mg by mouth daily.           Marland Kitchen  calcium carbonate (TUMS - DOSED IN MG ELEMENTAL CALCIUM) 500 MG chewable tablet   Oral   Chew 1 tablet by mouth daily.         . Carboxymeth-Glycerin-Polysorb (REFRESH OPTIVE ADVANCED) 0.5-1-0.5 % SOLN   Ophthalmic   Apply 1 drop to eye daily.         . cyclobenzaprine (FLEXERIL) 10 MG tablet   Oral   Take 10 mg by mouth 3 (three) times daily as needed for muscle spasms.          . metFORMIN (GLUCOPHAGE-XR) 500 MG 24 hr tablet   Oral   Take 500 mg by mouth daily with breakfast.         . naproxen (NAPROSYN) 500 MG tablet   Oral   Take 500 mg by mouth 2 (two) times daily with a meal.           Triage Vitals: BP 153/93  Pulse 80  Temp(Src) 98 F (36.7 C) (Oral)  Resp 18  Ht 5\' 2"  (1.575 m)  Wt 317 lb 4 oz (143.904 kg)  BMI 58.01 kg/m2  SpO2 100% Physical Exam  Nursing note and vitals reviewed. Constitutional: She is oriented to person, place, and time. She appears well-developed and well-nourished.  HENT:  Head: Normocephalic and atraumatic.  Eyes: Pupils are equal, round, and reactive to light.  Pupils are 6 mm and reactive to light.  Neck: Neck supple.  Cardiovascular: Normal rate, regular rhythm and normal heart sounds.   Pulmonary/Chest: Effort normal. No respiratory distress. She has no wheezes.  Abdominal: Soft. Bowel sounds are normal.  Neurological: She is alert and oriented to person, place, and time. No cranial nerve deficit. Coordination normal.  No dysmetria on finger to nose bilaterally. 5 out of 5 strength in all 4 extremities. Decreased sensation to light touch over the left face involving the for headache. Cranial nerves II through XII intact.  Skin: Skin is warm and dry.  Psychiatric: She has a normal mood and affect.    ED Course  Procedures (including critical care time) DIAGNOSTIC STUDIES: Oxygen Saturation is 100% on room air, normal by my interpretation.    COORDINATION OF CARE: 1:38 PM- Will order MRI of brain wo contrast, CBC, diff, CMP and UA. Patient informed of current plan for treatment and evaluation and agrees with plan at this time.     Labs Review Labs Reviewed  DIFFERENTIAL - Abnormal; Notable for the following:    Eosinophils Relative 6 (*)    All other components within normal limits  COMPREHENSIVE METABOLIC PANEL - Abnormal; Notable for the following:    Potassium 3.3 (*)    Glucose, Bld 113 (*)    Total Bilirubin 0.2 (*)    GFR calc non Af Amer 56 (*)    GFR calc Af Amer 65 (*)    All other components within normal limits  CBC  URINALYSIS, ROUTINE W REFLEX MICROSCOPIC  GLUCOSE, CAPILLARY    Imaging  Review Mr Brain Wo Contrast  08/03/2013   *RADIOLOGY REPORT*  Clinical Data: Headache with numbness all over. History of uterine carcinoma.  MRI HEAD WITHOUT CONTRAST  Technique:  Multiplanar, multiecho pulse sequences of the brain and surrounding structures were obtained according to standard protocol without intravenous contrast.  Comparison: CT head 02/04/2010.  Findings: There is no evidence for acute infarction, intracranial hemorrhage, mass lesion, hydrocephalus, or extra-axial fluid. Normal cerebral volume.  No white matter disease.  No large vessel infarct.  Flow voids are maintained.  No foci of chronic hemorrhage.  Normal pituitary and cerebellar tonsils.  Calvarium intact. Negative orbits. Mild chronic sinus disease.  Calvarium intact.  Pituitary and cerebellar tonsils unremarkable. Asymmetric left much greater than right middle ear and mastoid fluid is observed without obvious nasopharyngeal lesion.  The findings could represent otitis and mastoiditis but more likely simply represent bilateral effusions.  Correlate clinically.  No visible parameningeal focus of infection.  Compared with prior CT, fluid accumulation was not present in the left mastoids but some fluid was seen in the dependent portion of the right.  IMPRESSION: No acute intracranial findings are appreciated. No hemorrhage or intracranial mass lesion.  Correlate clinically for headache related to bilateral mastoid fluid, left greater than right.  No specific features to suggest nasopharyngeal obstruction of the eustachian tubes or mastoiditis.   Original Report Authenticated By: Davonna Belling, M.D.   EKG independently reviewed by myself:  Sinus rhythm with a rate of 82, no evidence of ST elevation, no change from prior MDM   1. Left facial numbness   2. Headache    This is a 52 year old female who presents with headache and left facial numbness. Patient reports facial numbness over last 2-3 months. She's been seeing her primary care  doctor. She states that the headache is somewhat new and facial numbness seems to have gotten worse. Patient is unsure of what the ophthalmologist saw or why she has been recommended to have an MRI and LP. Neurologic exam is only notable for decreased sensation over the left face to light touch. Lab work was obtained and is unremarkable. Patient was given Reglan and Benadryl for her headache. She reports improvement of her pain. Given her neurologic findings, MRI was obtained. MRI is negative for acute intracranial process. Patient does have fluid in her bilateral mastoids. She has no clinical evidence of mastoiditis. Given the chronicity of her symptoms, I do not feel she needs admission for further workup and can followup with neurology as an outpatient as already scheduled by her primary care provider. Patient stated understanding. Patient was given strict return precautions.  After history, exam, and medical workup I feel the patient has been appropriately medically screened and is safe for discharge home. Pertinent diagnoses were discussed with the patient. Patient was given return precautions.  I personally performed the services described in this documentation, which was scribed in my presence. The recorded information has been reviewed and is accurate.   Shon Baton, MD 08/03/13 2122

## 2013-08-03 NOTE — ED Notes (Signed)
Patient in MRI 

## 2013-08-03 NOTE — ED Notes (Signed)
Patient with no complaints at this time. Respirations even and unlabored. Skin warm/dry. Discharge instructions reviewed with patient at this time. Patient given opportunity to voice concerns/ask questions. IV removed per policy and band-aid applied to site. Patient discharged at this time and left Emergency Department with steady gait.  

## 2013-08-06 ENCOUNTER — Telehealth: Payer: Self-pay | Admitting: Nurse Practitioner

## 2013-08-07 ENCOUNTER — Encounter: Payer: Self-pay | Admitting: Neurology

## 2013-08-08 ENCOUNTER — Ambulatory Visit (INDEPENDENT_AMBULATORY_CARE_PROVIDER_SITE_OTHER): Payer: Medicaid Other | Admitting: Neurology

## 2013-08-08 ENCOUNTER — Encounter: Payer: Self-pay | Admitting: Neurology

## 2013-08-08 VITALS — BP 128/71 | HR 74 | Ht 65.0 in | Wt 317.0 lb

## 2013-08-08 DIAGNOSIS — R209 Unspecified disturbances of skin sensation: Secondary | ICD-10-CM

## 2013-08-08 DIAGNOSIS — E119 Type 2 diabetes mellitus without complications: Secondary | ICD-10-CM

## 2013-08-08 DIAGNOSIS — E785 Hyperlipidemia, unspecified: Secondary | ICD-10-CM

## 2013-08-08 DIAGNOSIS — I2510 Atherosclerotic heart disease of native coronary artery without angina pectoris: Secondary | ICD-10-CM

## 2013-08-08 DIAGNOSIS — F17200 Nicotine dependence, unspecified, uncomplicated: Secondary | ICD-10-CM

## 2013-08-08 MED ORDER — GABAPENTIN 300 MG PO CAPS: 300.0000 mg | ORAL_CAPSULE | Freq: Three times a day (TID) | ORAL | Status: DC

## 2013-08-08 NOTE — Progress Notes (Signed)
[name]  PATIENT: [name] DOB: [birth year]  HISTORICAL  Shakhia is a 52 years old right-handed Caucasian female, referred by her primary care physician Dr. Paulene Floor is, and optometrist Dr. Despina Arias for evaluation of left-sided numbness  About a month ago, she noticed sudden onset left facial numbness, also noticed numbness involving her left arm, leg, body, she denies significant weakness, she was recently diagnosed with diabetes in November 2013, she also complains of bilateral feet paresthesia, burning sensation, she has gait difficulty because of her feet pain, joints pain  MRI brain in Sep 26th 2014: showed no evidence for acute infarction, Asymmetric left much greater than right middle ear and mastoid fluid is observed    REVIEW OF SYSTEMS: Full 14 system review of systems performed and notable only for fever, chill, weight gain, hearing loss, ringing ears, blurry vision, shortness of breath, constipation, joint pain, joint swelling, cramps, achy muscles, headaches, numbness, weakness, dizziness, sleepiness, snoring, restless leg, depression  ALLERGIES: Allergies  Allergen Reactions  . Morphine And Related Nausea And Vomiting    HOME MEDICATIONS: Outpatient Prescriptions Prior to Visit  Medication Sig Dispense Refill  . aspirin 325 MG tablet Take 325 mg by mouth daily.        . calcium carbonate (TUMS - DOSED IN MG ELEMENTAL CALCIUM) 500 MG chewable tablet Chew 1 tablet by mouth daily.      . Carboxymeth-Glycerin-Polysorb (REFRESH OPTIVE ADVANCED) 0.5-1-0.5 % SOLN Apply 1 drop to eye daily.      . cyclobenzaprine (FLEXERIL) 10 MG tablet Take 10 mg by mouth 3 (three) times daily as needed for muscle spasms.       . metFORMIN (GLUCOPHAGE-XR) 500 MG 24 hr tablet Take 500 mg by mouth daily with breakfast.      . naproxen (NAPROSYN) 500 MG tablet Take 500 mg by mouth 2 (two) times daily with a meal.       No facility-administered medications prior to  visit.    PAST MEDICAL HISTORY: Past Medical History  Diagnosis Date  . GERD (gastroesophageal reflux disease)   . Hyperlipidemia   . Noncompliance   . CAD (coronary artery disease)     post PTCA with stent placement  . Morbid obesity   . Diabetes mellitus without complication   . Hypertension   . HA (headache)   . Left-sided face pain     PAST SURGICAL HISTORY: Past Surgical History  Procedure Laterality Date  . Cholecystectomy    . Abdominal hysterectomy    . Ptca      WITH STENT PLACEMENT  . Colonoscopy N/A 04/10/2013    Procedure: COLONOSCOPY;  Surgeon: Malissa Hippo, MD;  Location: [facility];  Service: Endoscopy;  Laterality: N/A;    FAMILY HISTORY: Family History  Problem Relation Age of Onset  . Coronary artery disease Father   . Cancer - Colon Father   . Breast cancer Mother   . Diabetes Father   . High Cholesterol Father     SOCIAL HISTORY:  History   Social History  . Marital Status: Married    Spouse Name: [name]    Number of Children: 5  . Years of Education: 10 th   Occupational History  .      disabled   Social History Main Topics  . Smoking status: Current Every Day Smoker -- 1.00 packs/day for 35 years    Types: Cigarettes  . Smokeless tobacco: Never Used  . Alcohol Use: No  .  Drug Use: No  . Sexual Activity: No   Other Topics Concern  . Not on file   Social History Narrative   Patient lives at home with her husband and grandchild [name]). Patient is disabled.   Patient has 10 th grade education.   Right handed.   Caffeine- two cups of coffee and soda Dr.Pepper and tea. daily     PHYSICAL EXAM   Filed Vitals:   08/08/13 1022  BP: 128/71  Pulse: 74  Height: 5\' 5"  (1.651 m)  Weight: 317 lb (143.79 kg)   Body mass index is 52.75 kg/(m^2).   Generalized: In no acute distress  Neck: Supple, no carotid bruits   Cardiac: Regular rate rhythm  Pulmonary: Clear to auscultation bilaterally  Musculoskeletal: No  deformity  Neurological examination  Mentation: Alert oriented to time, place, history taking, and causual conversation, obese  Cranial nerve II-XII: Pupils were equal round reactive to light extraocular movements were full, visual field were full on confrontational test. facial sensation and strength were normal. hearing was intact to finger rubbing bilaterally. Uvula tongue midline.  head turning and shoulder shrug and were normal and symmetric.Tongue protrusion into cheek strength was normal.  Motor: normal tone, bulk and strength.  Sensory: Intact to fine touch, pinprick, preserved vibratory sensation, and proprioception at toes.  Coordination: Normal finger to nose, heel-to-shin bilaterally there was no truncal ataxia  Gait: atalgic, obese, cautious gait  Romberg signs: Negative  Deep tendon reflexes: Brachioradialis 2/2, biceps 2/2, triceps 2/2, patellar 0/0, Achilles trace, plantar responses were flexor bilaterally.   DIAGNOSTIC DATA (LABS, IMAGING, TESTING) - I reviewed patient records, labs, notes, testing and imaging myself where available.  Lab Results  Component Value Date   WBC 6.5 08/03/2013   HGB 13.6 08/03/2013   HCT 39.9 08/03/2013   MCV 99.5 08/03/2013   PLT 165 08/03/2013      Component Value Date/Time   NA 143 08/03/2013 1338   K 3.3* 08/03/2013 1338   CL 104 08/03/2013 1338   CO2 27 08/03/2013 1338   GLUCOSE 113* 08/03/2013 1338   BUN 10 08/03/2013 1338   CREATININE 1.10 08/03/2013 1338   CALCIUM 9.8 08/03/2013 1338   PROT 7.4 08/03/2013 1338   ALBUMIN 4.1 08/03/2013 1338   AST 17 08/03/2013 1338   ALT 18 08/03/2013 1338   ALKPHOS 66 08/03/2013 1338   BILITOT 0.2* 08/03/2013 1338   GFRNONAA 56* 08/03/2013 1338   GFRAA 65* 08/03/2013 1338   Lab Results  Component Value Date   CHOL 196 04/09/2013   HDL 44 04/09/2013   LDLCALC 086* 04/09/2013   TRIG 184* 04/09/2013   CHOLHDL 4.5 04/09/2013   Lab Results  Component Value Date   HGBA1C 5.8* 04/09/2013   No results found  for this basename: VITAMINB12   Lab Results  Component Value Date   TSH 8.526* 04/09/2013     ASSESSMENT AND PLAN   52 years old Caucasian female, with past medical history of obesity, hypertension, diabetes, presenting with acute onset of left facial, body numbness, most suggestive of a small stroke involving right thalamus, but I did not see significant findings on MRI of the brain, which could miss a small lesion   1, I will continue evaluation with echocardiogram, ultrasound of carotid artery 2. Daily aspirin 3. Moderate exercise. 4. RTC in 6 months with Gerlene Fee, M.D. Ph.D.  [facility] [address] [place], [place] [address] [phone]

## 2013-08-10 ENCOUNTER — Encounter: Payer: Self-pay | Admitting: Nurse Practitioner

## 2013-08-10 ENCOUNTER — Ambulatory Visit (INDEPENDENT_AMBULATORY_CARE_PROVIDER_SITE_OTHER): Payer: Medicaid Other | Admitting: Nurse Practitioner

## 2013-08-10 VITALS — BP 152/79 | HR 80 | Temp 97.0°F | Ht 65.0 in | Wt 320.0 lb

## 2013-08-10 DIAGNOSIS — E119 Type 2 diabetes mellitus without complications: Secondary | ICD-10-CM

## 2013-08-10 DIAGNOSIS — R600 Localized edema: Secondary | ICD-10-CM

## 2013-08-10 DIAGNOSIS — I2510 Atherosclerotic heart disease of native coronary artery without angina pectoris: Secondary | ICD-10-CM

## 2013-08-10 DIAGNOSIS — R609 Edema, unspecified: Secondary | ICD-10-CM

## 2013-08-10 DIAGNOSIS — F411 Generalized anxiety disorder: Secondary | ICD-10-CM

## 2013-08-10 DIAGNOSIS — K219 Gastro-esophageal reflux disease without esophagitis: Secondary | ICD-10-CM

## 2013-08-10 DIAGNOSIS — E785 Hyperlipidemia, unspecified: Secondary | ICD-10-CM

## 2013-08-10 DIAGNOSIS — Z23 Encounter for immunization: Secondary | ICD-10-CM

## 2013-08-10 LAB — POCT GLYCOSYLATED HEMOGLOBIN (HGB A1C): Hemoglobin A1C: 5.8

## 2013-08-10 LAB — POCT UA - MICROALBUMIN: Microalbumin Ur, POC: POSITIVE mg/L

## 2013-08-10 MED ORDER — OMEPRAZOLE 40 MG PO CPDR: 40.0000 mg | DELAYED_RELEASE_CAPSULE | Freq: Every day | ORAL | Status: DC

## 2013-08-10 MED ORDER — LISINOPRIL 20 MG PO TABS: 20.0000 mg | ORAL_TABLET | Freq: Every day | ORAL | Status: DC

## 2013-08-10 MED ORDER — METFORMIN HCL ER 500 MG PO TB24: 500.0000 mg | ORAL_TABLET | Freq: Every day | ORAL | Status: DC

## 2013-08-10 MED ORDER — FUROSEMIDE 20 MG PO TABS: 20.0000 mg | ORAL_TABLET | Freq: Every day | ORAL | Status: DC

## 2013-08-10 NOTE — Addendum Note (Signed)
Addended by: Lisbeth Ply C on: 08/10/2013 04:09 PM   Modules accepted: Orders

## 2013-08-10 NOTE — Progress Notes (Signed)
Subjective:    Patient ID: [name], female    DOB: [birth year], 52 y.o.   MRN: [id number]  Diabetes She presents for her follow-up diabetic visit. She has type 2 diabetes mellitus. No MedicAlert identification noted. Her disease course has been stable. There are no hypoglycemic associated symptoms. Associated symptoms include blurred vision, fatigue, foot paresthesias and visual change. Pertinent negatives for diabetes include no foot ulcerations. There are no hypoglycemic complications. Symptoms are stable. Diabetic complications include a CVA and peripheral neuropathy. Risk factors for coronary artery disease include diabetes mellitus, dyslipidemia, hypertension, sedentary lifestyle and post-menopausal. Current diabetic treatment includes oral agent (monotherapy). She is compliant with treatment all of the time. Her weight is stable. She is following a generally unhealthy diet. When asked about meal planning, she reported none. She has not had a previous visit with a dietician. She rarely participates in exercise. Her breakfast blood glucose is taken between 8-9 am. Her breakfast blood glucose range is generally 110-130 mg/dl. An ACE inhibitor/angiotensin II receptor blocker is being taken. She does not see a podiatrist.Eye exam is current (Sept 2, 2014).  Hyperlipidemia This is a chronic problem. The current episode started more than 1 year ago. The problem is uncontrolled. Recent lipid tests were reviewed and are high. Exacerbating diseases include diabetes and obesity. Factors aggravating her hyperlipidemia include fatty foods and smoking. Associated symptoms include shortness of breath. She is currently on no antihyperlipidemic treatment. The current treatment provides no improvement of lipids. Compliance problems include adherence to diet.  Risk factors for coronary artery disease include a sedentary lifestyle, post-menopausal, obesity, hypertension, diabetes mellitus and dyslipidemia.  Peripheral  Neuropathy  Pt just started taking Neurontin yesterday- saw neurologist yesterday for numbness in her face and he diagnosed her with old CVA- but gave her meds for her neuropathy. Leg pain & Spasm  Pt taking flexeril-Pt states it helps GERD Patient  Only taking TUMS and zantac- says it is daily- OTC meds not helping Peripheral edema Use to be on fluid pills hasn't had in awhile- swelling daily   * was suppose to have a sleep study several years ago but didn't have money- needs scheduled now- patient states she has trouble breathing when she sleeps.   Review of Systems  Constitutional: Positive for fatigue.  Eyes: Positive for blurred vision.  Respiratory: Positive for shortness of breath.   All other systems reviewed and are negative.       Objective:   Physical Exam  Vitals reviewed. Constitutional: She is oriented to person, place, and time. She appears well-developed and well-nourished.  HENT:  Head: Normocephalic.  Right Ear: External ear normal.  Left Ear: External ear normal.  Eyes: Pupils are equal, round, and reactive to light.  Neck: Normal range of motion. Neck supple. No thyromegaly present.  Cardiovascular: Normal rate, regular rhythm, normal heart sounds and intact distal pulses.   Pulmonary/Chest: Effort normal and breath sounds normal. No respiratory distress.  Abdominal: Soft. Bowel sounds are normal. She exhibits no distension. There is no tenderness.  Musculoskeletal: Normal range of motion. She exhibits edema (3+ edema bilaterally in feet).  Neurological: She is alert and oriented to person, place, and time.  Skin: Skin is warm and dry. No rash noted. No erythema. No pallor.  Psychiatric: She has a normal mood and affect. Her behavior is normal. Judgment and thought content normal.      BP 152/79  Pulse 80  Temp(Src) 97 F (36.1 C) (Oral)  Ht 5\' 5"  (1.651  m)  Wt 320 lb (145.151 kg)  BMI 53.25 kg/m2  Results for orders placed in visit on 08/10/13   POCT GLYCOSYLATED HEMOGLOBIN (HGB A1C)      Result Value Range   Hemoglobin A1C 5.8%         Assessment & Plan:   1. Hyperlipidemia LDL goal < 100   2. Diabetes   3. Coronary artery disease   4. Lower extremity edema   5. Morbid obesity   6. GERD (gastroesophageal reflux disease)   7. Generalized anxiety disorder    Orders Placed This Encounter  Procedures  . CMP14+EGFR  . NMR, lipoprofile  . POCT glycosylated hemoglobin (Hb A1C)  . POCT UA - Microalbumin   Meds ordered this encounter  Medications  . lisinopril (PRINIVIL,ZESTRIL) 20 MG tablet    Sig: Take 1 tablet (20 mg total) by mouth daily.    Dispense:  30 tablet    Refill:  3    Order Specific Question:  Supervising Provider    Answer:  Ernestina Penna [[id number]]  . metFORMIN (GLUCOPHAGE-XR) 500 MG 24 hr tablet    Sig: Take 1 tablet (500 mg total) by mouth daily with breakfast.    Dispense:  30 tablet    Refill:  5    Order Specific Question:  Supervising Provider    Answer:  Ernestina Penna [[id number]]  . furosemide (LASIX) 20 MG tablet    Sig: Take 1 tablet (20 mg total) by mouth daily.    Dispense:  30 tablet    Refill:  3    Order Specific Question:  Supervising Provider    Answer:  Ernestina Penna [[id number]]  . omeprazole (PRILOSEC) 40 MG capsule    Sig: Take 1 capsule (40 mg total) by mouth daily.    Dispense:  30 capsule    Refill:  5    Order Specific Question:  Supervising Provider    Answer:  Ernestina Penna [[id number]]   added lasix, omeprazole and lisinopril Continue all meds Labs pending Diet and exercise encouraged Health maintenance reviewed Follow up in 3 months  Mary-Margaret Daphine Deutscher, FNP

## 2013-08-10 NOTE — Patient Instructions (Addendum)

## 2013-08-11 LAB — MICROALBUMIN, URINE: Microalbumin, Urine: <3 ug/mL (ref 0.0–17.0)

## 2013-08-12 LAB — CMP14+EGFR
ALT: 17 IU/L (ref 0–32)
AST: 11 IU/L (ref 0–40)
Albumin/Globulin Ratio: 2.2 (ref 1.1–2.5)
Albumin: 4.3 g/dL (ref 3.5–5.5)
Alkaline Phosphatase: 57 IU/L (ref 39–117)
BUN/Creatinine Ratio: 11 (ref 9–23)
BUN: 11 mg/dL (ref 6–24)
CO2: 27 mmol/L (ref 18–29)
Calcium: 9.2 mg/dL (ref 8.7–10.2)
Chloride: 103 mmol/L (ref 97–108)
Creatinine, Ser: 1.01 mg/dL — ABNORMAL HIGH (ref 0.57–1.00)
GFR calc Af Amer: 73 mL/min/{1.73_m2} (ref >59)
GFR calc non Af Amer: 63 mL/min/{1.73_m2} (ref >59)
Globulin, Total: 2 g/dL (ref 1.5–4.5)
Glucose: 100 mg/dL — ABNORMAL HIGH (ref 65–99)
Potassium: 3.8 mmol/L (ref 3.5–5.2)
Sodium: 144 mmol/L (ref 134–144)
Total Bilirubin: 0.2 mg/dL (ref 0.0–1.2)
Total Protein: 6.3 g/dL (ref 6.0–8.5)

## 2013-08-12 LAB — NMR, LIPOPROFILE
Cholesterol: 203 mg/dL — ABNORMAL HIGH (ref <200)
HDL Cholesterol by NMR: 42 mg/dL (ref >=40)
HDL Particle Number: 26.5 umol/L — ABNORMAL LOW (ref >=30.5)
LDL Particle Number: 2149 nmol/L — ABNORMAL HIGH (ref <1000)
LDL Size: 20.8 nm (ref >20.5)
LDLC SERPL CALC-MCNC: 130 mg/dL — ABNORMAL HIGH (ref <100)
LP-IR Score: 70 — ABNORMAL HIGH (ref <=45)
Small LDL Particle Number: 1075 nmol/L — ABNORMAL HIGH (ref <=527)
Triglycerides by NMR: 155 mg/dL — ABNORMAL HIGH (ref <150)

## 2013-08-13 ENCOUNTER — Other Ambulatory Visit: Payer: Self-pay | Admitting: Nurse Practitioner

## 2013-08-13 MED ORDER — ROSUVASTATIN CALCIUM 10 MG PO TABS: 10.0000 mg | ORAL_TABLET | Freq: Every day | ORAL | Status: DC

## 2013-08-14 ENCOUNTER — Ambulatory Visit (INDEPENDENT_AMBULATORY_CARE_PROVIDER_SITE_OTHER): Payer: Medicaid Other

## 2013-08-14 DIAGNOSIS — E785 Hyperlipidemia, unspecified: Secondary | ICD-10-CM

## 2013-08-14 DIAGNOSIS — E119 Type 2 diabetes mellitus without complications: Secondary | ICD-10-CM

## 2013-08-14 DIAGNOSIS — I2510 Atherosclerotic heart disease of native coronary artery without angina pectoris: Secondary | ICD-10-CM

## 2013-08-14 DIAGNOSIS — F17200 Nicotine dependence, unspecified, uncomplicated: Secondary | ICD-10-CM

## 2013-08-14 DIAGNOSIS — G459 Transient cerebral ischemic attack, unspecified: Secondary | ICD-10-CM

## 2013-09-05 ENCOUNTER — Encounter: Payer: Self-pay | Admitting: Family Medicine

## 2013-09-05 ENCOUNTER — Ambulatory Visit (INDEPENDENT_AMBULATORY_CARE_PROVIDER_SITE_OTHER): Payer: Medicaid Other | Admitting: Family Medicine

## 2013-09-05 VITALS — BP 130/82 | HR 110 | Temp 98.5°F | Ht 65.0 in | Wt 318.0 lb

## 2013-09-05 DIAGNOSIS — N390 Urinary tract infection, site not specified: Secondary | ICD-10-CM

## 2013-09-05 DIAGNOSIS — R300 Dysuria: Secondary | ICD-10-CM

## 2013-09-05 LAB — POCT URINALYSIS DIPSTICK
Bilirubin, UA: NEGATIVE
Glucose, UA: NEGATIVE
Ketones, UA: NEGATIVE
Nitrite, UA: NEGATIVE
Protein, UA: NEGATIVE
Spec Grav, UA: <=1.005
Urobilinogen, UA: NEGATIVE
pH, UA: 5

## 2013-09-05 LAB — POCT UA - MICROSCOPIC ONLY
Casts, Ur, LPF, POC: NEGATIVE
Crystals, Ur, HPF, POC: NEGATIVE
Yeast, UA: NEGATIVE

## 2013-09-05 MED ORDER — CIPROFLOXACIN HCL 500 MG PO TABS: 500.0000 mg | ORAL_TABLET | Freq: Two times a day (BID) | ORAL | Status: DC

## 2013-09-05 MED ORDER — PHENAZOPYRIDINE HCL 200 MG PO TABS: 200.0000 mg | ORAL_TABLET | Freq: Three times a day (TID) | ORAL | Status: DC | PRN

## 2013-09-05 NOTE — Progress Notes (Signed)
  Subjective:    Patient ID: MELL MELLOTT, female    DOB: [birth year], 52 y.o.   MRN: [id number]  HPI This 52 y.o. female presents for evaluation of dysuria and burning for a week when urinating. She has been feeling washed out and tired.   Review of Systems C/o dysuria   No chest pain, SOB, HA, dizziness, vision change, N/V, diarrhea, constipation, myalgias, arthralgias or rash.  Objective:   Physical Exam  Vital signs noted  Well developed well nourished female.  HEENT - Head atraumatic Normocephalic                Eyes - PERRLA, Conjuctiva - clear Sclera- Clear EOMI                Ears - EAC's Wnl TM's Wnl Gross Hearing WNL                Nose - Nares patent                 Throat - oropharanx wnl Respiratory - Lungs CTA bilateral Cardiac - RRR S1 and S2 without murmur GI - Abdomen soft Nontender and bowel sounds active x 4 Extremities - No edema. Neuro - Grossly intact.       Assessment & Plan:  Dysuria - Plan: POCT UA - Microscopic Only, POCT urinalysis dipstick, ciprofloxacin (CIPRO) 500 MG tablet, phenazopyridine (PYRIDIUM) 200 MG tablet, Urine culture  UTI (lower urinary tract infection) - Plan: ciprofloxacin (CIPRO) 500 MG tablet, phenazopyridine (PYRIDIUM) 200 MG tablet, Urine culture  Deatra Canter FNP

## 2013-09-05 NOTE — Patient Instructions (Signed)
Urinary Tract Infection  Urinary tract infections (UTIs) can develop anywhere along your urinary tract. Your urinary tract is your body's drainage system for removing wastes and extra water. Your urinary tract includes two kidneys, two ureters, a bladder, and a urethra. Your kidneys are a pair of bean-shaped organs. Each kidney is about the size of your fist. They are located below your ribs, one on each side of your spine.  CAUSES  Infections are caused by microbes, which are microscopic organisms, including fungi, viruses, and bacteria. These organisms are so small that they can only be seen through a microscope. Bacteria are the microbes that most commonly cause UTIs.  SYMPTOMS   Symptoms of UTIs may vary by age and gender of the patient and by the location of the infection. Symptoms in young women typically include a frequent and intense urge to urinate and a painful, burning feeling in the bladder or urethra during urination. Older women and men are more likely to be tired, shaky, and weak and have muscle aches and abdominal pain. A fever may mean the infection is in your kidneys. Other symptoms of a kidney infection include pain in your back or sides below the ribs, nausea, and vomiting.  DIAGNOSIS  To diagnose a UTI, your caregiver will ask you about your symptoms. Your caregiver also will ask to provide a urine sample. The urine sample will be tested for bacteria and white blood cells. White blood cells are made by your body to help fight infection.  TREATMENT   Typically, UTIs can be treated with medication. Because most UTIs are caused by a bacterial infection, they usually can be treated with the use of antibiotics. The choice of antibiotic and length of treatment depend on your symptoms and the type of bacteria causing your infection.  HOME CARE INSTRUCTIONS   If you were prescribed antibiotics, take them exactly as your caregiver instructs you. Finish the medication even if you feel better after you  have only taken some of the medication.   Drink enough water and fluids to keep your urine clear or pale yellow.   Avoid caffeine, tea, and carbonated beverages. They tend to irritate your bladder.   Empty your bladder often. Avoid holding urine for long periods of time.   Empty your bladder before and after sexual intercourse.   After a bowel movement, women should cleanse from front to back. Use each tissue only once.  SEEK MEDICAL CARE IF:    You have back pain.   You develop a fever.   Your symptoms do not begin to resolve within 3 days.  SEEK IMMEDIATE MEDICAL CARE IF:    You have severe back pain or lower abdominal pain.   You develop chills.   You have nausea or vomiting.   You have continued burning or discomfort with urination.  MAKE SURE YOU:    Understand these instructions.   Will watch your condition.   Will get help right away if you are not doing well or get worse.  Document Released: 08/04/2005 Document Revised: 04/25/2012 Document Reviewed: 12/03/2011  [employer] Patient Information 2014 [employer].

## 2013-09-07 LAB — URINE CULTURE

## 2013-09-13 ENCOUNTER — Other Ambulatory Visit (HOSPITAL_COMMUNITY): Payer: Self-pay | Admitting: Neurology

## 2013-09-13 DIAGNOSIS — G459 Transient cerebral ischemic attack, unspecified: Secondary | ICD-10-CM

## 2013-09-14 ENCOUNTER — Ambulatory Visit (HOSPITAL_COMMUNITY): Payer: Medicaid Other | Attending: Cardiology | Admitting: Radiology

## 2013-09-14 DIAGNOSIS — Z6841 Body Mass Index (BMI) 40.0 and over, adult: Secondary | ICD-10-CM | POA: Insufficient documentation

## 2013-09-14 DIAGNOSIS — R079 Chest pain, unspecified: Secondary | ICD-10-CM

## 2013-09-14 DIAGNOSIS — E119 Type 2 diabetes mellitus without complications: Secondary | ICD-10-CM | POA: Insufficient documentation

## 2013-09-14 DIAGNOSIS — R0600 Dyspnea, unspecified: Secondary | ICD-10-CM

## 2013-09-14 DIAGNOSIS — R209 Unspecified disturbances of skin sensation: Secondary | ICD-10-CM | POA: Insufficient documentation

## 2013-09-14 DIAGNOSIS — G459 Transient cerebral ischemic attack, unspecified: Secondary | ICD-10-CM

## 2013-09-14 DIAGNOSIS — I2510 Atherosclerotic heart disease of native coronary artery without angina pectoris: Secondary | ICD-10-CM

## 2013-09-14 DIAGNOSIS — E785 Hyperlipidemia, unspecified: Secondary | ICD-10-CM | POA: Insufficient documentation

## 2013-09-14 DIAGNOSIS — I10 Essential (primary) hypertension: Secondary | ICD-10-CM | POA: Insufficient documentation

## 2013-09-14 NOTE — Progress Notes (Signed)
Echocardiogram performed.  

## 2013-09-17 ENCOUNTER — Telehealth: Payer: Self-pay | Admitting: Neurology

## 2013-09-17 NOTE — Progress Notes (Signed)
Quick Note:  Please call patient, no significant abnormality on his ECHO. US carotid was normal. ______

## 2013-09-21 NOTE — Progress Notes (Signed)
Quick Note:  Spoke to patient and relayed ECHO results and US carotid results, per Dr. Terrace Arabia. ______

## 2013-10-09 ENCOUNTER — Telehealth: Payer: Self-pay | Admitting: Nurse Practitioner

## 2013-10-09 NOTE — Telephone Encounter (Signed)
I do not have- please pull chart so I can see if clearance is on chart

## 2013-10-09 NOTE — Telephone Encounter (Signed)
Results and chart reviewed, she has normal ultrasound of carotid arteries, and essentially normal echocardiogram.  Letter was generated, she is okay to proceed with planned eye surgery.

## 2013-10-09 NOTE — Telephone Encounter (Signed)
Patient aware and will have them re-send fax to [place]

## 2013-10-09 NOTE — Telephone Encounter (Signed)
Patient's letter was faxed over to [facility].

## 2013-11-19 ENCOUNTER — Ambulatory Visit (INDEPENDENT_AMBULATORY_CARE_PROVIDER_SITE_OTHER): Payer: Medicaid Other | Admitting: Nurse Practitioner

## 2013-11-19 ENCOUNTER — Encounter: Payer: Self-pay | Admitting: Nurse Practitioner

## 2013-11-19 VITALS — BP 146/75 | HR 89 | Temp 97.9°F | Ht 65.0 in | Wt 329.0 lb

## 2013-11-19 DIAGNOSIS — E119 Type 2 diabetes mellitus without complications: Secondary | ICD-10-CM

## 2013-11-19 DIAGNOSIS — R600 Localized edema: Secondary | ICD-10-CM

## 2013-11-19 DIAGNOSIS — F411 Generalized anxiety disorder: Secondary | ICD-10-CM

## 2013-11-19 DIAGNOSIS — G4700 Insomnia, unspecified: Secondary | ICD-10-CM

## 2013-11-19 DIAGNOSIS — R609 Edema, unspecified: Secondary | ICD-10-CM

## 2013-11-19 DIAGNOSIS — K644 Residual hemorrhoidal skin tags: Secondary | ICD-10-CM

## 2013-11-19 DIAGNOSIS — K219 Gastro-esophageal reflux disease without esophagitis: Secondary | ICD-10-CM

## 2013-11-19 DIAGNOSIS — I2510 Atherosclerotic heart disease of native coronary artery without angina pectoris: Secondary | ICD-10-CM

## 2013-11-19 DIAGNOSIS — E785 Hyperlipidemia, unspecified: Secondary | ICD-10-CM

## 2013-11-19 LAB — POCT GLYCOSYLATED HEMOGLOBIN (HGB A1C): Hemoglobin A1C: 6

## 2013-11-19 MED ORDER — GABAPENTIN 300 MG PO CAPS: 300.0000 mg | ORAL_CAPSULE | Freq: Three times a day (TID) | ORAL | Status: DC

## 2013-11-19 MED ORDER — METFORMIN HCL ER 500 MG PO TB24: 500.0000 mg | ORAL_TABLET | Freq: Every day | ORAL | Status: DC

## 2013-11-19 MED ORDER — OMEPRAZOLE 40 MG PO CPDR: 40.0000 mg | DELAYED_RELEASE_CAPSULE | Freq: Every day | ORAL | Status: DC

## 2013-11-19 MED ORDER — CYCLOBENZAPRINE HCL 10 MG PO TABS: 10.0000 mg | ORAL_TABLET | Freq: Three times a day (TID) | ORAL | Status: DC | PRN

## 2013-11-19 MED ORDER — ROSUVASTATIN CALCIUM 10 MG PO TABS: 10.0000 mg | ORAL_TABLET | Freq: Every day | ORAL | Status: DC

## 2013-11-19 MED ORDER — FUROSEMIDE 40 MG PO TABS: 40.0000 mg | ORAL_TABLET | Freq: Two times a day (BID) | ORAL | Status: DC

## 2013-11-19 MED ORDER — LISINOPRIL 20 MG PO TABS: 20.0000 mg | ORAL_TABLET | Freq: Every day | ORAL | Status: DC

## 2013-11-19 NOTE — Patient Instructions (Signed)
Diabetes and Foot Care Diabetes may cause you to have problems because of poor blood supply (circulation) to your feet and legs. This may cause the skin on your feet to become thinner, break easier, and heal more slowly. Your skin may become dry, and the skin may peel and crack. You may also have nerve damage in your legs and feet causing decreased feeling in them. You may not notice minor injuries to your feet that could lead to infections or more serious problems. Taking care of your feet is one of the most important things you can do for yourself.  HOME CARE INSTRUCTIONS  Wear shoes at all times, even in the house. Do not go barefoot. Bare feet are easily injured.  Check your feet daily for blisters, cuts, and redness. If you cannot see the bottom of your feet, use a mirror or ask someone for help.  Wash your feet with warm water (do not use hot water) and mild soap. Then pat your feet and the areas between your toes until they are completely dry. Do not soak your feet as this can dry your skin.  Apply a moisturizing lotion or petroleum jelly (that does not contain alcohol and is unscented) to the skin on your feet and to dry, brittle toenails. Do not apply lotion between your toes.  Trim your toenails straight across. Do not dig under them or around the cuticle. File the edges of your nails with an emery board or nail file.  Do not cut corns or calluses or try to remove them with medicine.  Wear clean socks or stockings every day. Make sure they are not too tight. Do not wear knee-high stockings since they may decrease blood flow to your legs.  Wear shoes that fit properly and have enough cushioning. To break in new shoes, wear them for just a few hours a day. This prevents you from injuring your feet. Always look in your shoes before you put them on to be sure there are no objects inside.  Do not cross your legs. This may decrease the blood flow to your feet.  If you find a minor scrape,  cut, or break in the skin on your feet, keep it and the skin around it clean and dry. These areas may be cleansed with mild soap and water. Do not cleanse the area with peroxide, alcohol, or iodine.  When you remove an adhesive bandage, be sure not to damage the skin around it.  If you have a wound, look at it several times a day to make sure it is healing.  Do not use heating pads or hot water bottles. They may burn your skin. If you have lost feeling in your feet or legs, you may not know it is happening until it is too late.  Make sure your health care provider performs a complete foot exam at least annually or more often if you have foot problems. Report any cuts, sores, or bruises to your health care provider immediately. SEEK MEDICAL CARE IF:   You have an injury that is not healing.  You have cuts or breaks in the skin.  You have an ingrown nail.  You notice redness on your legs or feet.  You feel burning or tingling in your legs or feet.  You have pain or cramps in your legs and feet.  Your legs or feet are numb.  Your feet always feel cold. SEEK IMMEDIATE MEDICAL CARE IF:   There is increasing redness,   swelling, or pain in or around a wound.  There is a red line that goes up your leg.  Pus is coming from a wound.  You develop a fever or as directed by your health care provider.  You notice a bad smell coming from an ulcer or wound. Document Released: 10/22/2000 Document Revised: 06/27/2013 Document Reviewed: 04/03/2013 [employer] Patient Information 2014 [employer].  

## 2013-11-19 NOTE — Progress Notes (Signed)
Subjective:    Patient ID: [name], female    DOB: 1961-02-15, 53 y.o.   MRN: [id number]  Diabetes She presents for her follow-up diabetic visit. She has type 2 diabetes mellitus. No MedicAlert identification noted. Her disease course has been stable. There are no hypoglycemic associated symptoms. Associated symptoms include blurred vision, fatigue, foot paresthesias and visual change. Pertinent negatives for diabetes include no foot ulcerations. There are no hypoglycemic complications. Symptoms are stable. Diabetic complications include a CVA and peripheral neuropathy. Risk factors for coronary artery disease include diabetes mellitus, dyslipidemia, hypertension, sedentary lifestyle and post-menopausal. Current diabetic treatment includes oral agent (monotherapy). She is compliant with treatment all of the time. Her weight is stable. She is following a generally unhealthy diet. When asked about meal planning, she reported none. She has not had a previous visit with a dietician. She rarely participates in exercise. Her breakfast blood glucose is taken between 8-9 am. Her breakfast blood glucose range is generally 110-130 mg/dl. An ACE inhibitor/angiotensin II receptor blocker is being taken. She does not see a podiatrist.Eye exam is current (Sept 2, 2014).  Hyperlipidemia This is a chronic problem. The current episode started more than 1 year ago. The problem is uncontrolled. Recent lipid tests were reviewed and are high. Exacerbating diseases include diabetes and obesity. Factors aggravating her hyperlipidemia include fatty foods and smoking. Associated symptoms include shortness of breath. She is currently on no antihyperlipidemic treatment. The current treatment provides no improvement of lipids. Compliance problems include adherence to diet.  Risk factors for coronary artery disease include a sedentary lifestyle, post-menopausal, obesity, hypertension, diabetes mellitus and dyslipidemia.  Peripheral  Neuropathy  Pt just started taking Neurontin yesterday- saw neurologist yesterday for numbness in her face and he diagnosed her with old CVA- but gave her meds for her neuropathy. Leg pain & Spasm  Pt taking flexeril-Pt states it helps- Has to walk with a cane in order to keep from falling GERD Patient  Only taking TUMS and zantac- says it is daily- OTC meds not helping Peripheral edema Swelling of bil lower ext has worsened over the last month. Taking lasix daily.     Review of Systems  Constitutional: Positive for fatigue.  Eyes: Positive for blurred vision.  Respiratory: Positive for shortness of breath.   All other systems reviewed and are negative.       Objective:   Physical Exam  Vitals reviewed. Constitutional: She is oriented to person, place, and time. She appears well-developed and well-nourished.  HENT:  Head: Normocephalic.  Right Ear: External ear normal.  Left Ear: External ear normal.  Eyes: Pupils are equal, round, and reactive to light.  Neck: Normal range of motion. Neck supple. No thyromegaly present.  Cardiovascular: Normal rate, regular rhythm, normal heart sounds and intact distal pulses.   Pulmonary/Chest: Effort normal and breath sounds normal. No respiratory distress.  Abdominal: Soft. Bowel sounds are normal. She exhibits no distension. There is no tenderness.  Musculoskeletal: Normal range of motion. She exhibits edema (3+ edema bilaterally in feet).  Neurological: She is alert and oriented to person, place, and time.  Skin: Skin is warm and dry. No rash noted. No erythema. No pallor.  Psychiatric: She has a normal mood and affect. Her behavior is normal. Judgment and thought content normal.      BP 146/75  Pulse 89  Temp(Src) 97.9 F (36.6 C) (Oral)  Ht _0  (1.651 m)  Wt 329 lb (149.233 kg)  BMI 54.75 kg/m2 Results for  orders placed in visit on 11/19/13  POCT GLYCOSYLATED HEMOGLOBIN (HGB A1C)      Result Value Range   Hemoglobin A1C  6.0%         Assessment & Plan:   1. Hyperlipidemia LDL goal < 100   2. Type II or unspecified type diabetes mellitus without mention of complication, not stated as uncontrolled   3. Coronary artery disease   4. Lower extremity edema   5. Insomnia   6. GERD (gastroesophageal reflux disease)   7. Generalized anxiety disorder   8. External bleeding hemorrhoids   9. Morbid obesity    Orders Placed This Encounter  Procedures  . CMP14+EGFR  . NMR, lipoprofile  . POCT glycosylated hemoglobin (Hb A1C)   Meds ordered this encounter  Medications  . furosemide (LASIX) 40 MG tablet    Sig: Take 1 tablet (40 mg total) by mouth 2 (two) times daily.    Dispense:  60 tablet    Refill:  5    Order Specific Question:  Supervising Provider    Answer:  Chipper Herb [[id number]]  . cyclobenzaprine (FLEXERIL) 10 MG tablet    Sig: Take 1 tablet (10 mg total) by mouth 3 (three) times daily as needed for muscle spasms.    Dispense:  30 tablet    Refill:  5    Order Specific Question:  Supervising Provider    Answer:  Chipper Herb [[id number]]  . rosuvastatin (CRESTOR) 10 MG tablet    Sig: Take 1 tablet (10 mg total) by mouth daily.    Dispense:  30 tablet    Refill:  5    Order Specific Question:  Supervising Provider    Answer:  Chipper Herb [[id number]]  . lisinopril (PRINIVIL,ZESTRIL) 20 MG tablet    Sig: Take 1 tablet (20 mg total) by mouth daily.    Dispense:  30 tablet    Refill:  5    Order Specific Question:  Supervising Provider    Answer:  Chipper Herb [[id number]]  . gabapentin (NEURONTIN) 300 MG capsule    Sig: Take 1 capsule (300 mg total) by mouth 3 (three) times daily.    Dispense:  90 capsule    Refill:  12    Order Specific Question:  Supervising Provider    Answer:  Chipper Herb [[id number]]  . omeprazole (PRILOSEC) 40 MG capsule    Sig: Take 1 capsule (40 mg total) by mouth daily.    Dispense:  30 capsule    Refill:  5    Order Specific Question:  Supervising Provider     Answer:  Chipper Herb [[id number]]  . metFORMIN (GLUCOPHAGE-XR) 500 MG 24 hr tablet    Sig: Take 1 tablet (500 mg total) by mouth daily with breakfast.    Dispense:  30 tablet    Refill:  5    Order Specific Question:  Supervising Provider    Answer:  Joycelyn Man    Continue all meds Labs pending Diet and exercise encouraged Health maintenance reviewed Follow up in 3 months  [place], FNP

## 2013-11-21 ENCOUNTER — Telehealth: Payer: Self-pay | Admitting: Nurse Practitioner

## 2013-11-21 ENCOUNTER — Ambulatory Visit (INDEPENDENT_AMBULATORY_CARE_PROVIDER_SITE_OTHER): Payer: Medicaid Other | Admitting: Nurse Practitioner

## 2013-11-21 ENCOUNTER — Encounter: Payer: Self-pay | Admitting: Nurse Practitioner

## 2013-11-21 VITALS — BP 149/89 | HR 104 | Temp 97.3°F | Ht 65.0 in | Wt 329.0 lb

## 2013-11-21 DIAGNOSIS — R609 Edema, unspecified: Secondary | ICD-10-CM

## 2013-11-21 LAB — NMR, LIPOPROFILE
Cholesterol: 126 mg/dL (ref <200)
HDL Cholesterol by NMR: 41 mg/dL (ref >=40)
HDL Particle Number: 29.2 umol/L — ABNORMAL LOW (ref >=30.5)
LDL Particle Number: 997 nmol/L (ref <1000)
LDL Size: 20.5 nm — ABNORMAL LOW (ref >20.5)
LDLC SERPL CALC-MCNC: 63 mg/dL (ref <100)
LP-IR Score: 56 — ABNORMAL HIGH (ref <=45)
Small LDL Particle Number: 573 nmol/L — ABNORMAL HIGH (ref <=527)
Triglycerides by NMR: 112 mg/dL (ref <150)

## 2013-11-21 LAB — CMP14+EGFR
ALT: 14 IU/L (ref 0–32)
AST: 19 IU/L (ref 0–40)
Albumin/Globulin Ratio: 2.2 (ref 1.1–2.5)
Albumin: 4.1 g/dL (ref 3.5–5.5)
Alkaline Phosphatase: 78 IU/L (ref 39–117)
BUN/Creatinine Ratio: 10 (ref 9–23)
BUN: 12 mg/dL (ref 6–24)
CO2: 23 mmol/L (ref 18–29)
Calcium: 9.1 mg/dL (ref 8.7–10.2)
Chloride: 104 mmol/L (ref 97–108)
Creatinine, Ser: 1.16 mg/dL — ABNORMAL HIGH (ref 0.57–1.00)
GFR calc Af Amer: 62 mL/min/{1.73_m2} (ref >59)
GFR calc non Af Amer: 54 mL/min/{1.73_m2} — ABNORMAL LOW (ref >59)
Globulin, Total: 1.9 g/dL (ref 1.5–4.5)
Glucose: 122 mg/dL — ABNORMAL HIGH (ref 65–99)
Potassium: 4 mmol/L (ref 3.5–5.2)
Sodium: 144 mmol/L (ref 134–144)
Total Bilirubin: <0.2 mg/dL (ref 0.0–1.2)
Total Protein: 6 g/dL (ref 6.0–8.5)

## 2013-11-21 MED ORDER — METOLAZONE 2.5 MG PO TABS: 2.5000 mg | ORAL_TABLET | Freq: Every day | ORAL | Status: DC

## 2013-11-21 MED ORDER — ROSUVASTATIN CALCIUM 20 MG PO TABS: 20.0000 mg | ORAL_TABLET | Freq: Every day | ORAL | Status: DC

## 2013-11-21 NOTE — Telephone Encounter (Signed)
appt made for today 

## 2013-11-21 NOTE — Patient Instructions (Signed)

## 2013-11-21 NOTE — Progress Notes (Signed)
   Subjective:    Patient ID: [name], female    DOB: 03/18/1961, 53 y.o.   MRN: [id number]  HPI Patient for recheck of lower ext edema- husband thought legs looked red- says they still hurt. Increased lasix to 40 mg BID and she says swelling has not improved. No increasing SOB.    Review of Systems  Respiratory: Negative.   Cardiovascular: Positive for leg swelling.  All other systems reviewed and are negative.       Objective:   Physical Exam  Constitutional: She is oriented to person, place, and time. She appears well-developed and well-nourished.  Cardiovascular: Normal rate, regular rhythm and normal heart sounds.   Pulmonary/Chest: Effort normal and breath sounds normal.  Musculoskeletal: She exhibits edema (3+ edema bil ower ext).  Neurological: She is alert and oriented to person, place, and time.  Skin: Skin is warm.   BP 149/89  Pulse 104  Temp(Src) 97.3 F (36.3 C) (Oral)  Ht 5\' 5"  (1.651 m)  Wt 329 lb (149.233 kg)  BMI 54.75 kg/m2        Assessment & Plan:   1. Peripheral edema    Meds ordered this encounter  Medications  . rosuvastatin (CRESTOR) 20 MG tablet    Sig: Take 1 tablet (20 mg total) by mouth daily.    Dispense:  90 tablet    Refill:  3    Order Specific Question:  Supervising Provider    Answer:  Ernestina PennaMOORE, DONALD W [[id number]]  . metolazone (ZAROXOLYN) 2.5 MG tablet    Sig: Take 1 tablet (2.5 mg total) by mouth daily.    Dispense:  10 tablet    Refill:  0    Order Specific Question:  Supervising Provider    Answer:  Ernestina PennaMOORE, DONALD W [[id number]]   Discussed labs with patient Increased crestor to 20mg  daily Take zaroxlyn for 4 days rto Monday recheck  Mary-Margaret Daphine DeutscherMartin, FNP

## 2013-11-21 NOTE — Telephone Encounter (Signed)
NTBS.

## 2013-11-21 NOTE — Telephone Encounter (Signed)
See by you on 11/19/13. Legs are cold and numb, feet red, and hands aching. Was having these symptoms before you changed her diabetic medication. What does she need to do? Should she be seen.

## 2013-11-26 ENCOUNTER — Ambulatory Visit (INDEPENDENT_AMBULATORY_CARE_PROVIDER_SITE_OTHER): Payer: Medicaid Other | Admitting: Nurse Practitioner

## 2013-11-26 ENCOUNTER — Encounter: Payer: Self-pay | Admitting: Nurse Practitioner

## 2013-11-26 VITALS — BP 97/65 | HR 118 | Temp 96.4°F | Ht 65.0 in | Wt 325.0 lb

## 2013-11-26 DIAGNOSIS — R609 Edema, unspecified: Secondary | ICD-10-CM

## 2013-11-26 DIAGNOSIS — R252 Cramp and spasm: Secondary | ICD-10-CM

## 2013-11-26 MED ORDER — METOLAZONE 2.5 MG PO TABS: ORAL_TABLET | ORAL | Status: DC

## 2013-11-26 NOTE — Patient Instructions (Signed)

## 2013-11-26 NOTE — Progress Notes (Signed)
   Subjective:    Patient ID: [name], female    DOB: 01/17/1961, 53 y.o.   MRN: [id number]  HPI Patient in today for follow up of peripheral edema/venous stasis- she was put on zaroxlyn daily for he last several days- she is also lasix as rx- she says the swelling has greatly improved but she is starting to have cramps. Weight down 4 lbs     Review of Systems  Constitutional: Negative.   HENT: Negative.   Respiratory: Negative for cough and chest tightness.   Cardiovascular: Positive for leg swelling. Negative for chest pain and palpitations.  Gastrointestinal: Negative.   Genitourinary: Negative.   Musculoskeletal: Negative.        Objective:   Physical Exam  Constitutional: She is oriented to person, place, and time. She appears well-developed and well-nourished.  Cardiovascular: Normal rate, regular rhythm and normal heart sounds.   Pulmonary/Chest: Effort normal and breath sounds normal.  Musculoskeletal: She exhibits edema (1+ bil feet).  Neurological: She is alert and oriented to person, place, and time.  Skin: Skin is warm.   BP 97/65  Pulse 118  Temp(Src) 96.4 F (35.8 C) (Oral)  Ht $R'5\' 5"'sv$  (1.651 m)  Wt 325 lb (147.419 kg)  BMI 54.08 kg/m2        Assessment & Plan:   1. Peripheral edema   2. Cramps of lower extremity    Orders Placed This Encounter  Procedures  . BMP8+EGFR   Meds ordered this encounter  Medications  . metolazone (ZAROXOLYN) 2.5 MG tablet    Sig: 1 tablet Po every Monday    Dispense:  30 tablet    Refill:  0    Order Specific Question:  Supervising Provider    Answer:  Chipper Herb [[id number]]   Elevate legs when sitting Make sure take lasix BID zaroxlyn only on Mondays Labs pending RTO prn  Mary-Margaret Hassell Done, FNP

## 2013-11-27 LAB — BMP8+EGFR
BUN/Creatinine Ratio: 13 (ref 9–23)
BUN: 35 mg/dL — ABNORMAL HIGH (ref 6–24)
CO2: 22 mmol/L (ref 18–29)
Calcium: 9.2 mg/dL (ref 8.7–10.2)
Chloride: 91 mmol/L — ABNORMAL LOW (ref 97–108)
Creatinine, Ser: 2.71 mg/dL — ABNORMAL HIGH (ref 0.57–1.00)
GFR calc Af Amer: 22 mL/min/{1.73_m2} — ABNORMAL LOW (ref >59)
GFR calc non Af Amer: 19 mL/min/{1.73_m2} — ABNORMAL LOW (ref >59)
Glucose: 132 mg/dL — ABNORMAL HIGH (ref 65–99)
Potassium: 4.1 mmol/L (ref 3.5–5.2)
Sodium: 137 mmol/L (ref 134–144)

## 2013-11-30 ENCOUNTER — Telehealth: Payer: Self-pay | Admitting: Nurse Practitioner

## 2013-12-12 ENCOUNTER — Other Ambulatory Visit: Payer: Self-pay | Admitting: Nurse Practitioner

## 2013-12-12 DIAGNOSIS — R748 Abnormal levels of other serum enzymes: Secondary | ICD-10-CM

## 2013-12-12 NOTE — Telephone Encounter (Signed)
Patient is already seeing neurologist

## 2013-12-12 NOTE — Telephone Encounter (Signed)
MMM will put an order in for a nephrologist.

## 2013-12-21 ENCOUNTER — Telehealth: Payer: Self-pay | Admitting: Nurse Practitioner

## 2014-02-08 ENCOUNTER — Ambulatory Visit: Payer: Medicaid Other | Admitting: Nurse Practitioner

## 2014-02-11 ENCOUNTER — Ambulatory Visit: Payer: Medicaid Other | Admitting: Nurse Practitioner

## 2014-02-15 ENCOUNTER — Encounter: Payer: Self-pay | Admitting: Nurse Practitioner

## 2014-02-15 ENCOUNTER — Encounter (INDEPENDENT_AMBULATORY_CARE_PROVIDER_SITE_OTHER): Payer: Self-pay

## 2014-02-15 ENCOUNTER — Ambulatory Visit (INDEPENDENT_AMBULATORY_CARE_PROVIDER_SITE_OTHER): Payer: Medicaid Other | Admitting: Nurse Practitioner

## 2014-02-15 VITALS — BP 121/74 | HR 94 | Ht 65.0 in | Wt 325.0 lb

## 2014-02-15 DIAGNOSIS — E785 Hyperlipidemia, unspecified: Secondary | ICD-10-CM

## 2014-02-15 DIAGNOSIS — R400 Somnolence: Secondary | ICD-10-CM

## 2014-02-15 DIAGNOSIS — E119 Type 2 diabetes mellitus without complications: Secondary | ICD-10-CM

## 2014-02-15 DIAGNOSIS — I2510 Atherosclerotic heart disease of native coronary artery without angina pectoris: Secondary | ICD-10-CM

## 2014-02-15 DIAGNOSIS — G4710 Hypersomnia, unspecified: Secondary | ICD-10-CM

## 2014-02-15 NOTE — Patient Instructions (Signed)
Continue aspirin for secondary stroke prevention 2-D echo and carotid Doppler studies were normal Will set up for sleep study Continue to monitor risk factors for stroke such as  blood pressure less than 130 systolic Hemoglobin A1c less than 6.5 LDL less than 70, cholesterol less than 200 Followup with me in 6 months

## 2014-02-15 NOTE — Progress Notes (Signed)
[name]  PATIENT: [name] DOB: 05/04/1961   REASON FOR VISIT: Followup for left-sided numbness    HISTORY OF PRESENT ILLNESS: [name], 53 year old female returns for followup. She was initially evaluated by Dr. Terrace ArabiaYan 08/08/2013 for left-sided numbness. MRI of the brain in September 2014 without evidence of acute infarction. Carotid ultrasound and 2-D echo were normal. She is currently on aspirin 325 daily. She is morbidly obese and has multiple risk factors including diabetes, hypercholesterolemia, coronary artery disease. She also complains with some feet paresthesias, but lower extremities are edematous in the 3-4+ range. She claims she cannot exercise due to her arthritic joints. Her daughter who accompanies her today says she snores at night and quits breathing. She has never had a sleep study. She complains of significant daytime drowsiness. She has multiple complaints on review of systems. She returns for reevaluation.    HISTORY: evaluation of left-sided numbness  About a month ago, she noticed sudden onset left facial numbness, also noticed numbness involving her left arm, leg, body, she denies significant weakness, she was recently diagnosed with diabetes in November 2013, she also complains of bilateral feet paresthesia, burning sensation, she has gait difficulty because of her feet pain, joints pain  MRI brain in Sep 26th 2014: showed no evidence for acute infarction, Asymmetric left much greater than right middle ear and mastoid fluid is observed    REVIEW OF SYSTEMS: Full 14 system review of systems performed and notable only for those listed, all others are neg:  Constitutional: Fatigue Cardiovascular: Leg swelling Ear/Nose/Throat: N/A  Skin: N/A  Eyes: Itching  Respiratory: Shortness of breath, cough Gastroitestinal: Abdominal pain, constipation, urinary frequency Hematology/Lymphatic: Easy bruising  Endocrine: N/A Musculoskeletal: Joint pain  back pain muscle cramps, difficulty walking Allergy/Immunology: N/A  Neurological: Weakness, numbness, headache, memory loss Psychiatric: N/A   ALLERGIES: Allergies  Allergen Reactions  . Morphine And Related Nausea And Vomiting    HOME MEDICATIONS: Outpatient Prescriptions Prior to Visit  Medication Sig Dispense Refill  . aspirin 325 MG tablet Take 325 mg by mouth daily.        . Aspirin-Acetaminophen-Caffeine (EXCEDRIN PO) Take by mouth as needed.      . calcium carbonate (TUMS - DOSED IN MG ELEMENTAL CALCIUM) 500 MG chewable tablet Chew 1 tablet by mouth daily.      . cyclobenzaprine (FLEXERIL) 10 MG tablet Take 1 tablet (10 mg total) by mouth 3 (three) times daily as needed for muscle spasms.  30 tablet  5  . furosemide (LASIX) 40 MG tablet Take 1 tablet (40 mg total) by mouth 2 (two) times daily.  60 tablet  5  . gabapentin (NEURONTIN) 300 MG capsule Take 1 capsule (300 mg total) by mouth 3 (three) times daily.  90 capsule  12  . lisinopril (PRINIVIL,ZESTRIL) 20 MG tablet Take 1 tablet (20 mg total) by mouth daily.  30 tablet  5  . metFORMIN (GLUCOPHAGE-XR) 500 MG 24 hr tablet Take 1 tablet (500 mg total) by mouth daily with breakfast.  30 tablet  5  . metolazone (ZAROXOLYN) 2.5 MG tablet 1 tablet Po every Monday  30 tablet  0  . naproxen (NAPROSYN) 500 MG tablet Take 500 mg by mouth 2 (two) times daily with a meal.      . omeprazole (PRILOSEC) 40 MG capsule Take 1 capsule (40 mg total) by mouth daily.  30 capsule  5  . rosuvastatin (CRESTOR) 20 MG tablet Take 1 tablet (20 mg total) by  mouth daily.  90 tablet  3   No facility-administered medications prior to visit.    PAST MEDICAL HISTORY: Past Medical History  Diagnosis Date  . GERD (gastroesophageal reflux disease)   . Hyperlipidemia   . Noncompliance   . CAD (coronary artery disease)     post PTCA with stent placement  . Morbid obesity   . Diabetes mellitus without complication   . Hypertension   . HA (headache)     . Left-sided face pain     PAST SURGICAL HISTORY: Past Surgical History  Procedure Laterality Date  . Cholecystectomy    . Abdominal hysterectomy    . Ptca      WITH STENT PLACEMENT  . Colonoscopy N/A 04/10/2013    Procedure: COLONOSCOPY;  Surgeon: Malissa Hippo, MD;  Location: [facility];  Service: Endoscopy;  Laterality: N/A;  . Eye surgery      FAMILY HISTORY: Family History  Problem Relation Age of Onset  . Coronary artery disease Father   . Cancer - Colon Father   . Breast cancer Mother   . Diabetes Father   . High Cholesterol Father     SOCIAL HISTORY: History   Social History  . Marital Status: Married    Spouse Name: [name]    Number of Children: 5  . Years of Education: 10 th   Occupational History  .      disabled   Social History Main Topics  . Smoking status: Current Every Day Smoker -- 1.00 packs/day for 35 years    Types: Cigarettes  . Smokeless tobacco: Never Used  . Alcohol Use: No  . Drug Use: No  . Sexual Activity: No   Other Topics Concern  . Not on file   Social History Narrative   Patient lives at home with her husband and grandchild [name]). Patient is disabled.   Patient has 10 th grade education.   Right handed.   Caffeine- two cups of coffee and soda Dr.Pepper and tea. daily     PHYSICAL EXAM  Filed Vitals:   02/15/14 0850  BP: 121/74  Pulse: 94  Height: 5\' 5"  (1.651 m)  Weight: 325 lb (147.419 kg)   Body mass index is 54.08 kg/(m^2).  Generalized: Well developed, morbidly obese female in no acute distress  Head: normocephalic and atraumatic,. mallopatti 4 Neck: Supple, no carotid bruits , 17.5 inches Cardiac: Regular rate rhythm, no murmur  Musculoskeletal: No deformity  Skin 3-4+ pitting edema both lower extremities  Neurological examination   Mentation: Alert oriented to time, place, history taking. Follows all commands speech and language fluent. ESS 21  Cranial nerve II-XII: Pupils were equal round  reactive to light extraocular movements were full, visual field were full on confrontational test. Facial sensation and strength were normal. hearing was intact to finger rubbing bilaterally. Uvula tongue midline. head turning and shoulder shrug were normal and symmetric.Tongue protrusion into cheek strength was normal. Motor: normal bulk and tone, full strength in the BUE, BLE, fine finger movements normal, no pronator drift. No focal weakness Coordination: finger-nose-finger,  no dysmetria Reflexes: Brachioradialis 2/2, biceps 2/2, triceps 2/2, patellar 0/0, Achilles trace , plantar responses were flexor bilaterally. Gait and Station: In wheelchair   DIAGNOSTIC DATA (LABS, IMAGING, TESTING) - I reviewed patient records, labs, notes, testing and imaging myself where available.  Lab Results  Component Value Date   WBC 6.5 08/03/2013   HGB 13.6 08/03/2013   HCT 39.9 08/03/2013   MCV 99.5 08/03/2013  PLT 165 08/03/2013      Component Value Date/Time   NA 137 11/26/2013 1110   NA 143 08/03/2013 1338   K 4.1 11/26/2013 1110   CL 91* 11/26/2013 1110   CO2 22 11/26/2013 1110   GLUCOSE 132* 11/26/2013 1110   GLUCOSE 113* 08/03/2013 1338   BUN 35* 11/26/2013 1110   BUN 10 08/03/2013 1338   CREATININE 2.71* 11/26/2013 1110   CALCIUM 9.2 11/26/2013 1110   PROT 6.0 11/19/2013 1024   PROT 7.4 08/03/2013 1338   ALBUMIN 4.1 08/03/2013 1338   AST 19 11/19/2013 1024   ALT 14 11/19/2013 1024   ALKPHOS 78 11/19/2013 1024   BILITOT <0.2 11/19/2013 1024   GFRNONAA 19* 11/26/2013 1110   GFRAA 22* 11/26/2013 1110   Lab Results  Component Value Date   CHOL 126 11/19/2013   HDL 44 04/09/2013   LDLCALC 161* 04/09/2013   TRIG 184* 04/09/2013   CHOLHDL 4.5 04/09/2013   Lab Results  Component Value Date   HGBA1C 6.0% 11/19/2013    Lab Results  Component Value Date   TSH 8.526* 04/09/2013      ASSESSMENT AND PLAN  53 y.o. year old female  has a past medical history of GERD (gastroesophageal reflux disease);  Hyperlipidemia; Noncompliance; CAD (coronary artery disease); Morbid obesity; Diabetes mellitus without complication; Hypertension; HA (headache); and Left-sided face pain. here to follow up. MRI of the brain with nothing acute, carotid ultrasound normal, 2D echo cardiogram normal . Patient reports excessive daytime drowsiness. She is a patient of  Dr. Terrace Arabia who is  out of the office today  Continue aspirin for secondary stroke prevention Will set up for sleep study, due to morbid obesity, ESS score, etc. Continue to monitor risk factors for stroke such as  blood pressure less than 130 systolic Hemoglobin A1c less than 6.5 LDL less than 70, cholesterol less than 200 Followup with me in 6 months Nilda Riggs, [facility], [facility], APRN  [facility] [address], [address] [place], [place] [address] [phone]

## 2014-02-18 ENCOUNTER — Ambulatory Visit (INDEPENDENT_AMBULATORY_CARE_PROVIDER_SITE_OTHER): Payer: Medicaid Other | Admitting: Nurse Practitioner

## 2014-02-18 ENCOUNTER — Encounter: Payer: Self-pay | Admitting: Nurse Practitioner

## 2014-02-18 VITALS — BP 116/82 | HR 80 | Temp 96.3°F | Ht 62.0 in | Wt 336.4 lb

## 2014-02-18 DIAGNOSIS — F411 Generalized anxiety disorder: Secondary | ICD-10-CM

## 2014-02-18 DIAGNOSIS — F17200 Nicotine dependence, unspecified, uncomplicated: Secondary | ICD-10-CM

## 2014-02-18 DIAGNOSIS — K219 Gastro-esophageal reflux disease without esophagitis: Secondary | ICD-10-CM

## 2014-02-18 DIAGNOSIS — E1140 Type 2 diabetes mellitus with diabetic neuropathy, unspecified: Secondary | ICD-10-CM

## 2014-02-18 DIAGNOSIS — G8929 Other chronic pain: Secondary | ICD-10-CM

## 2014-02-18 DIAGNOSIS — E1149 Type 2 diabetes mellitus with other diabetic neurological complication: Secondary | ICD-10-CM

## 2014-02-18 DIAGNOSIS — R600 Localized edema: Secondary | ICD-10-CM

## 2014-02-18 DIAGNOSIS — G4700 Insomnia, unspecified: Secondary | ICD-10-CM

## 2014-02-18 DIAGNOSIS — E1142 Type 2 diabetes mellitus with diabetic polyneuropathy: Secondary | ICD-10-CM

## 2014-02-18 DIAGNOSIS — I2510 Atherosclerotic heart disease of native coronary artery without angina pectoris: Secondary | ICD-10-CM

## 2014-02-18 DIAGNOSIS — E785 Hyperlipidemia, unspecified: Secondary | ICD-10-CM

## 2014-02-18 DIAGNOSIS — G4730 Sleep apnea, unspecified: Secondary | ICD-10-CM

## 2014-02-18 DIAGNOSIS — M549 Dorsalgia, unspecified: Secondary | ICD-10-CM

## 2014-02-18 DIAGNOSIS — E119 Type 2 diabetes mellitus without complications: Secondary | ICD-10-CM

## 2014-02-18 DIAGNOSIS — R609 Edema, unspecified: Secondary | ICD-10-CM

## 2014-02-18 LAB — POCT GLYCOSYLATED HEMOGLOBIN (HGB A1C): Hemoglobin A1C: 6

## 2014-02-18 MED ORDER — GABAPENTIN 300 MG PO CAPS: 300.0000 mg | ORAL_CAPSULE | Freq: Four times a day (QID) | ORAL | Status: DC

## 2014-02-18 MED ORDER — FUROSEMIDE 80 MG PO TABS: 80.0000 mg | ORAL_TABLET | Freq: Every day | ORAL | Status: DC

## 2014-02-18 MED ORDER — OMEPRAZOLE 40 MG PO CPDR: 40.0000 mg | DELAYED_RELEASE_CAPSULE | Freq: Every day | ORAL | Status: DC

## 2014-02-18 MED ORDER — LISINOPRIL 20 MG PO TABS: 20.0000 mg | ORAL_TABLET | Freq: Every day | ORAL | Status: DC

## 2014-02-18 MED ORDER — ROSUVASTATIN CALCIUM 20 MG PO TABS: 20.0000 mg | ORAL_TABLET | Freq: Every day | ORAL | Status: DC

## 2014-02-18 MED ORDER — METOLAZONE 2.5 MG PO TABS: ORAL_TABLET | ORAL | Status: DC

## 2014-02-18 MED ORDER — CYCLOBENZAPRINE HCL 10 MG PO TABS: 10.0000 mg | ORAL_TABLET | Freq: Three times a day (TID) | ORAL | Status: DC | PRN

## 2014-02-18 MED ORDER — METFORMIN HCL ER 500 MG PO TB24: 500.0000 mg | ORAL_TABLET | Freq: Every day | ORAL | Status: DC

## 2014-02-18 NOTE — Patient Instructions (Signed)
Diabetes and Foot Care Diabetes may cause you to have problems because of poor blood supply (circulation) to your feet and legs. This may cause the skin on your feet to become thinner, break easier, and heal more slowly. Your skin may become dry, and the skin may peel and crack. You may also have nerve damage in your legs and feet causing decreased feeling in them. You may not notice minor injuries to your feet that could lead to infections or more serious problems. Taking care of your feet is one of the most important things you can do for yourself.  HOME CARE INSTRUCTIONS  Wear shoes at all times, even in the house. Do not go barefoot. Bare feet are easily injured.  Check your feet daily for blisters, cuts, and redness. If you cannot see the bottom of your feet, use a mirror or ask someone for help.  Wash your feet with warm water (do not use hot water) and mild soap. Then pat your feet and the areas between your toes until they are completely dry. Do not soak your feet as this can dry your skin.  Apply a moisturizing lotion or petroleum jelly (that does not contain alcohol and is unscented) to the skin on your feet and to dry, brittle toenails. Do not apply lotion between your toes.  Trim your toenails straight across. Do not dig under them or around the cuticle. File the edges of your nails with an emery board or nail file.  Do not cut corns or calluses or try to remove them with medicine.  Wear clean socks or stockings every day. Make sure they are not too tight. Do not wear knee-high stockings since they may decrease blood flow to your legs.  Wear shoes that fit properly and have enough cushioning. To break in new shoes, wear them for just a few hours a day. This prevents you from injuring your feet. Always look in your shoes before you put them on to be sure there are no objects inside.  Do not cross your legs. This may decrease the blood flow to your feet.  If you find a minor scrape,  cut, or break in the skin on your feet, keep it and the skin around it clean and dry. These areas may be cleansed with mild soap and water. Do not cleanse the area with peroxide, alcohol, or iodine.  When you remove an adhesive bandage, be sure not to damage the skin around it.  If you have a wound, look at it several times a day to make sure it is healing.  Do not use heating pads or hot water bottles. They may burn your skin. If you have lost feeling in your feet or legs, you may not know it is happening until it is too late.  Make sure your health care provider performs a complete foot exam at least annually or more often if you have foot problems. Report any cuts, sores, or bruises to your health care provider immediately. SEEK MEDICAL CARE IF:   You have an injury that is not healing.  You have cuts or breaks in the skin.  You have an ingrown nail.  You notice redness on your legs or feet.  You feel burning or tingling in your legs or feet.  You have pain or cramps in your legs and feet.  Your legs or feet are numb.  Your feet always feel cold. SEEK IMMEDIATE MEDICAL CARE IF:   There is increasing redness,   swelling, or pain in or around a wound.  There is a red line that goes up your leg.  Pus is coming from a wound.  You develop a fever or as directed by your health care provider.  You notice a bad smell coming from an ulcer or wound. Document Released: 10/22/2000 Document Revised: 06/27/2013 Document Reviewed: 04/03/2013 [employer] Patient Information 2014 [employer].  

## 2014-02-18 NOTE — Progress Notes (Signed)
Subjective:    Patient ID: [name], female    DOB: 06/08/1961, 52 y.o.   MRN: [id number]  Diabetes She presents for her follow-up diabetic visit. She has type 2 diabetes mellitus. No MedicAlert identification noted. Her disease course has been stable. There are no hypoglycemic associated symptoms. Associated symptoms include blurred vision, fatigue, foot paresthesias and visual change. Pertinent negatives for diabetes include no foot ulcerations. There are no hypoglycemic complications. Symptoms are stable. Diabetic complications include a CVA and peripheral neuropathy. Risk factors for coronary artery disease include diabetes mellitus, dyslipidemia, hypertension, sedentary lifestyle and post-menopausal. Current diabetic treatment includes oral agent (monotherapy). She is compliant with treatment all of the time. Her weight is stable. She is following a generally unhealthy diet. When asked about meal planning, she reported none. She has not had a previous visit with a dietician. She rarely participates in exercise. Her breakfast blood glucose is taken between 8-9 am. Her breakfast blood glucose range is generally 110-130 mg/dl. An ACE inhibitor/angiotensin II receptor blocker is being taken. She does not see a podiatrist.Eye exam is current (Sept 2, 2014).  Hyperlipidemia This is a chronic problem. The current episode started more than 1 year ago. The problem is uncontrolled. Recent lipid tests were reviewed and are high. Exacerbating diseases include diabetes and obesity. Factors aggravating her hyperlipidemia include fatty foods and smoking. Associated symptoms include shortness of breath. She is currently on no antihyperlipidemic treatment. The current treatment provides no improvement of lipids. Compliance problems include adherence to diet.  Risk factors for coronary artery disease include a sedentary lifestyle, post-menopausal, obesity, hypertension, diabetes mellitus and dyslipidemia.  Peripheral  Neuropathy  Patient is on neuropathy and says that her feet are still burning bad- Leg pain & Spasm  Pt taking flexeril-Pt states it helps- Has to walk with a cane in order to keep from falling GERD Patient  Only taking TUMS and zantac- says it is daily- OTC meds not helping Peripheral edema Swelling of bil lower ext has worsened over the last month. Taking lasix daily.     Review of Systems  Constitutional: Positive for fatigue.  Eyes: Positive for blurred vision.  Respiratory: Positive for shortness of breath.   All other systems reviewed and are negative.      Objective:   Physical Exam  Vitals reviewed. Constitutional: She is oriented to person, place, and time. She appears well-developed and well-nourished.  HENT:  Head: Normocephalic.  Right Ear: External ear normal.  Left Ear: External ear normal.  Eyes: Pupils are equal, round, and reactive to light.  Neck: Normal range of motion. Neck supple. No thyromegaly present.  Cardiovascular: Normal rate, regular rhythm, normal heart sounds and intact distal pulses.   Pulmonary/Chest: Effort normal and breath sounds normal. No respiratory distress.  Abdominal: Soft. Bowel sounds are normal. She exhibits no distension. There is no tenderness.  Musculoskeletal: Normal range of motion. She exhibits edema (3+ edema bilaterally in feet).  Neurological: She is alert and oriented to person, place, and time.  Skin: Skin is warm and dry. No rash noted. No erythema. No pallor.  Psychiatric: She has a normal mood and affect. Her behavior is normal. Judgment and thought content normal.     BP 116/82  Pulse 80  Temp(Src) 96.3 F (35.7 C) (Oral)  Ht 5' 2" (1.575 m)  Wt 336 lb 6.4 oz (152.59 kg)  BMI 61.51 kg/m2  Results for orders placed in visit on 02/18/14  POCT GLYCOSYLATED HEMOGLOBIN (HGB A1C)        Result Value Ref Range   Hemoglobin A1C 6.0%         Assessment & Plan:   1. CAD (coronary artery disease)   2. Type II or  unspecified type diabetes mellitus without mention of complication, not stated as uncontrolled   3. Tobacco use disorder   4. Sleep apnea   5. Morbid obesity   6. Lower extremity edema   7. Insomnia   8. Hyperlipidemia LDL goal < 100   9. GERD (gastroesophageal reflux disease)   10. Generalized anxiety disorder   11. Diabetic neuropathy   12. Chronic back pain    Orders Placed This Encounter  Procedures  . NMR, lipoprofile  . CMP14+EGFR  . POCT glycosylated hemoglobin (Hb A1C)   Meds ordered this encounter  Medications  . furosemide (LASIX) 80 MG tablet    Sig: Take 1 tablet (80 mg total) by mouth daily.    Dispense:  30 tablet    Refill:  3    Order Specific Question:  Supervising Provider    Answer:  Chipper Herb [[id number]]  . gabapentin (NEURONTIN) 300 MG capsule    Sig: Take 1 capsule (300 mg total) by mouth 4 (four) times daily.    Dispense:  120 capsule    Refill:  3    Order Specific Question:  Supervising Provider    Answer:  Chipper Herb [[id number]]  . cyclobenzaprine (FLEXERIL) 10 MG tablet    Sig: Take 1 tablet (10 mg total) by mouth 3 (three) times daily as needed for muscle spasms.    Dispense:  30 tablet    Refill:  5    Order Specific Question:  Supervising Provider    Answer:  Chipper Herb [[id number]]  . lisinopril (PRINIVIL,ZESTRIL) 20 MG tablet    Sig: Take 1 tablet (20 mg total) by mouth daily.    Dispense:  30 tablet    Refill:  5    Order Specific Question:  Supervising Provider    Answer:  Chipper Herb [[id number]]  . metFORMIN (GLUCOPHAGE-XR) 500 MG 24 hr tablet    Sig: Take 1 tablet (500 mg total) by mouth daily with breakfast.    Dispense:  30 tablet    Refill:  5    Order Specific Question:  Supervising Provider    Answer:  Chipper Herb [[id number]]  . metolazone (ZAROXOLYN) 2.5 MG tablet    Sig: 1 tablet Po every Monday    Dispense:  30 tablet    Refill:  0    Order Specific Question:  Supervising Provider    Answer:  Chipper Herb [[id number]]  .  omeprazole (PRILOSEC) 40 MG capsule    Sig: Take 1 capsule (40 mg total) by mouth daily.    Dispense:  30 capsule    Refill:  5    Order Specific Question:  Supervising Provider    Answer:  Chipper Herb [[id number]]  . rosuvastatin (CRESTOR) 20 MG tablet    Sig: Take 1 tablet (20 mg total) by mouth daily.    Dispense:  90 tablet    Refill:  3    Order Specific Question:  Supervising Provider    Answer:  Chipper Herb [[id number]]   Increased lasix to 80 BID Increased neurotin to QID Recheck on nephrology referral. Labs pending Health maintenance reviewed Diet and exercise encouraged Continue all meds Follow up  In 3 months    [place], FNP

## 2014-02-19 ENCOUNTER — Telehealth: Payer: Self-pay | Admitting: Nurse Practitioner

## 2014-02-19 LAB — CMP14+EGFR
ALT: 14 IU/L (ref 0–32)
AST: 16 IU/L (ref 0–40)
Albumin/Globulin Ratio: 2.1 (ref 1.1–2.5)
Albumin: 4.5 g/dL (ref 3.5–5.5)
Alkaline Phosphatase: 87 IU/L (ref 39–117)
BUN/Creatinine Ratio: 18 (ref 9–23)
BUN: 49 mg/dL — ABNORMAL HIGH (ref 6–24)
CO2: 20 mmol/L (ref 18–29)
Calcium: 9.6 mg/dL (ref 8.7–10.2)
Chloride: 97 mmol/L (ref 97–108)
Creatinine, Ser: 2.69 mg/dL — ABNORMAL HIGH (ref 0.57–1.00)
GFR calc Af Amer: 22 mL/min/{1.73_m2} — ABNORMAL LOW (ref >59)
GFR calc non Af Amer: 19 mL/min/{1.73_m2} — ABNORMAL LOW (ref >59)
Globulin, Total: 2.1 g/dL (ref 1.5–4.5)
Glucose: 110 mg/dL — ABNORMAL HIGH (ref 65–99)
Potassium: 4.6 mmol/L (ref 3.5–5.2)
Sodium: 139 mmol/L (ref 134–144)
Total Bilirubin: <0.2 mg/dL (ref 0.0–1.2)
Total Protein: 6.6 g/dL (ref 6.0–8.5)

## 2014-02-19 LAB — NMR, LIPOPROFILE
Cholesterol: 123 mg/dL (ref <200)
HDL Cholesterol by NMR: 43 mg/dL (ref >=40)
HDL Particle Number: 29.6 umol/L — ABNORMAL LOW (ref >=30.5)
LDL Particle Number: 793 nmol/L (ref <1000)
LDL Size: 20.6 nm (ref >20.5)
LDLC SERPL CALC-MCNC: 50 mg/dL (ref <100)
LP-IR Score: 59 — ABNORMAL HIGH (ref <=45)
Small LDL Particle Number: 499 nmol/L (ref <=527)
Triglycerides by NMR: 152 mg/dL — ABNORMAL HIGH (ref <150)

## 2014-02-20 ENCOUNTER — Telehealth: Payer: Self-pay | Admitting: Neurology

## 2014-02-20 DIAGNOSIS — R0683 Snoring: Secondary | ICD-10-CM

## 2014-02-20 DIAGNOSIS — R51 Headache: Secondary | ICD-10-CM

## 2014-02-20 NOTE — Telephone Encounter (Signed)
. °  Darrol Angelarolyn Martin, NP is referring [name], 53 y.o. female, for an attended sleep study.  Wt: 325 lbs Ht: 65.5 in. BMI: 54.08  Diagnoses: Morbid Obesity Excessive Daytime Sleepiness Snoring Witnessed Apneas Hypercholesterolemia Coronary Artery Disease Diabetes Mellitus Headache   Medication List: Current Outpatient Prescriptions  Medication Sig Dispense Refill   aspirin 325 MG tablet Take 325 mg by mouth daily.         Aspirin-Acetaminophen-Caffeine (EXCEDRIN PO) Take by mouth as needed.       calcium carbonate (TUMS - DOSED IN MG ELEMENTAL CALCIUM) 500 MG chewable tablet Chew 1 tablet by mouth daily.       cyclobenzaprine (FLEXERIL) 10 MG tablet Take 1 tablet (10 mg total) by mouth 3 (three) times daily as needed for muscle spasms.  30 tablet  5   furosemide (LASIX) 80 MG tablet Take 1 tablet (80 mg total) by mouth daily.  30 tablet  3   gabapentin (NEURONTIN) 300 MG capsule Take 1 capsule (300 mg total) by mouth 4 (four) times daily.  120 capsule  3   lisinopril (PRINIVIL,ZESTRIL) 20 MG tablet Take 1 tablet (20 mg total) by mouth daily.  30 tablet  5   metFORMIN (GLUCOPHAGE-XR) 500 MG 24 hr tablet Take 1 tablet (500 mg total) by mouth daily with breakfast.  30 tablet  5   metolazone (ZAROXOLYN) 2.5 MG tablet 1 tablet Po every Monday  30 tablet  0   naproxen (NAPROSYN) 500 MG tablet Take 500 mg by mouth 2 (two) times daily with a meal.       omeprazole (PRILOSEC) 40 MG capsule Take 1 capsule (40 mg total) by mouth daily.  30 capsule  5   rosuvastatin (CRESTOR) 20 MG tablet Take 1 tablet (20 mg total) by mouth daily.  90 tablet  3   No current facility-administered medications for this visit.    This patient presents to Darrol Angelarolyn Martin, NP in follow up for left sided numbness.  This is a morbidly obese patient with complaint of excessive daytime sleepiness, Epworth endorsed at 21.  She has a neck size of 17.5 and has a Mallampati class IV airway.  Her daughter  reports the patient snoring and has noticed periods in which the she stops breathing.   Darrol Angelarolyn Martin, NP would like the patient to proceed with a sleep evaluation due to multiple risk factors for stroke.  Insurance:  Medicaid

## 2014-02-20 NOTE — Telephone Encounter (Signed)
Patient aware.

## 2014-02-20 NOTE — Telephone Encounter (Signed)
Obesity, loud snoring , comorbidities and EDS. Split study ordered.

## 2014-02-21 ENCOUNTER — Other Ambulatory Visit: Payer: Self-pay | Admitting: Nurse Practitioner

## 2014-02-21 DIAGNOSIS — R600 Localized edema: Secondary | ICD-10-CM

## 2014-02-21 MED ORDER — METOLAZONE 2.5 MG PO TABS: ORAL_TABLET | ORAL | Status: DC

## 2014-02-22 ENCOUNTER — Telehealth: Payer: Self-pay | Admitting: Nurse Practitioner

## 2014-02-25 ENCOUNTER — Telehealth: Payer: Self-pay | Admitting: Nurse Practitioner

## 2014-02-26 MED ORDER — BENZYL ALCOHOL 5 % EX LOTN: 1.0000 "application " | TOPICAL_LOTION | Freq: Once | CUTANEOUS | Status: DC

## 2014-02-26 NOTE — Telephone Encounter (Signed)
Last used it 2 weeks ago. They have tried OTC head treatment and spray for room.

## 2014-02-26 NOTE — Telephone Encounter (Signed)
rx sent

## 2014-02-26 NOTE — Telephone Encounter (Signed)
When was the last time you treated yourself- can only do every 7 days.

## 2014-03-14 ENCOUNTER — Telehealth: Payer: Self-pay | Admitting: Nurse Practitioner

## 2014-03-15 NOTE — Telephone Encounter (Signed)
debbi can you please check on this

## 2014-03-25 ENCOUNTER — Ambulatory Visit (INDEPENDENT_AMBULATORY_CARE_PROVIDER_SITE_OTHER): Payer: Medicaid Other | Admitting: Family Medicine

## 2014-03-25 ENCOUNTER — Encounter: Payer: Self-pay | Admitting: Family Medicine

## 2014-03-25 ENCOUNTER — Other Ambulatory Visit: Payer: Self-pay

## 2014-03-25 VITALS — BP 112/69 | HR 81 | Temp 99.3°F | Ht 62.0 in | Wt 336.6 lb

## 2014-03-25 DIAGNOSIS — R600 Localized edema: Secondary | ICD-10-CM

## 2014-03-25 DIAGNOSIS — B999 Unspecified infectious disease: Secondary | ICD-10-CM

## 2014-03-25 DIAGNOSIS — R198 Other specified symptoms and signs involving the digestive system and abdomen: Secondary | ICD-10-CM

## 2014-03-25 DIAGNOSIS — R109 Unspecified abdominal pain: Secondary | ICD-10-CM

## 2014-03-25 LAB — POCT CBC
Granulocyte percent: 56 %G (ref 37–80)
HCT, POC: 32.3 % — AB (ref 37.7–47.9)
Hemoglobin: 10.2 g/dL — AB (ref 12.2–16.2)
Lymph, poc: 3.3 (ref 0.6–3.4)
MCH, POC: 32.4 pg — AB (ref 27–31.2)
MCHC: 31.7 g/dL — AB (ref 31.8–35.4)
MCV: 102.4 fL — AB (ref 80–97)
MPV: 10.8 fL (ref 0–99.8)
POC LYMPH PERCENT: 37 %L (ref 10–50)
Platelet Count, POC: 154 10*3/uL (ref 142–424)
RBC: 3.2 M/uL — AB (ref 4.04–5.48)
RDW, POC: 15.7 %
WBC: 8.9 10*3/uL (ref 4.6–10.2)

## 2014-03-25 MED ORDER — CEFTRIAXONE SODIUM 1 G IJ SOLR
1.0000 g | INTRAMUSCULAR | Status: AC
  Administered 2014-03-25: 1 g via INTRAMUSCULAR

## 2014-03-25 MED ORDER — NAPROXEN 500 MG PO TABS: 500.0000 mg | ORAL_TABLET | Freq: Two times a day (BID) | ORAL | Status: DC

## 2014-03-25 MED ORDER — DOXYCYCLINE HYCLATE 100 MG PO TABS: 100.0000 mg | ORAL_TABLET | Freq: Two times a day (BID) | ORAL | Status: DC

## 2014-03-25 MED ORDER — HYDROCODONE-ACETAMINOPHEN 5-325 MG PO TABS: 1.0000 | ORAL_TABLET | Freq: Four times a day (QID) | ORAL | Status: DC | PRN

## 2014-03-25 NOTE — Progress Notes (Signed)
   Subjective:    Patient ID: [name], female    DOB: 12/04/1960, 53 y.o.   MRN: [id number]  HPI This 53 y.o. female presents for evaluation of abdominal pain, fever, chills, and infection in  Umbilicus for a month.  She states she has had a drainage and infection for a month in her Umbilicus.  She has been having increased purulent drainage from the wound.  She is having chills And fever at night.  She is now having pain on the side of her umbilicus.   Review of Systems C/o fever and abdominal pain No chest pain, SOB, HA, dizziness, vision change, N/V, diarrhea, constipation, dysuria, urinary urgency or frequency, myalgias, arthralgias or rash.     Objective:   Physical Exam Vital signs noted  Well developed well nourished morbidly obese female.  HEENT - Head atraumatic Normocephalic                Eyes - PERRLA, Conjuctiva - clear Sclera- Clear EOMI                Ears - EAC's Wnl TM's Wnl Gross Hearing WNL                Throat - oropharanx wnl Respiratory - Lungs CTA bilateral Cardiac - RRR S1 and S2 without murmur GI - Large pannus abdomen with large umbilicus with ulcer on left side and green yellow  Exudate on the tissue used for bandage.  She is tender periumbilical region and firm.      Assessment & Plan:  Infection - Plan: POCT CBC, Aerobic culture, CT Abdomen Pelvis Wo Contrast, cefTRIAXone (ROCEPHIN) injection 1 g, doxycycline (VIBRA-TABS) 100 MG tablet  Umbilical discharge - Plan: Aerobic culture, CT Abdomen Pelvis Wo Contrast, cefTRIAXone (ROCEPHIN) injection 1 g, doxycycline (VIBRA-TABS) 100 MG tablet  Abdominal pain, unspecified site - Plan: Aerobic culture, CT Abdomen Pelvis Wo Contrast, cefTRIAXone (ROCEPHIN) injection 1 g, doxycycline (VIBRA-TABS) 100 MG tablet  Follow up prn and in one week.  Deatra CanterWilliam J Oxford FNP

## 2014-03-26 ENCOUNTER — Other Ambulatory Visit: Payer: Self-pay | Admitting: Family Medicine

## 2014-03-27 ENCOUNTER — Ambulatory Visit (INDEPENDENT_AMBULATORY_CARE_PROVIDER_SITE_OTHER): Payer: Medicaid Other | Admitting: Neurology

## 2014-03-27 ENCOUNTER — Other Ambulatory Visit: Payer: Self-pay | Admitting: Family Medicine

## 2014-03-27 DIAGNOSIS — R51 Headache: Secondary | ICD-10-CM

## 2014-03-27 DIAGNOSIS — R0683 Snoring: Secondary | ICD-10-CM

## 2014-03-27 DIAGNOSIS — L0390 Cellulitis, unspecified: Secondary | ICD-10-CM

## 2014-03-27 DIAGNOSIS — G4733 Obstructive sleep apnea (adult) (pediatric): Secondary | ICD-10-CM

## 2014-03-27 LAB — AEROBIC CULTURE

## 2014-03-28 ENCOUNTER — Ambulatory Visit (HOSPITAL_COMMUNITY)
Admission: RE | Admit: 2014-03-28 | Discharge: 2014-03-28 | Disposition: A | Discharge status: Home / Self Care | Payer: Medicaid Other | Source: Ambulatory Visit | Attending: Family Medicine | Admitting: Family Medicine

## 2014-03-28 DIAGNOSIS — B999 Unspecified infectious disease: Secondary | ICD-10-CM

## 2014-03-28 DIAGNOSIS — L02219 Cutaneous abscess of trunk, unspecified: Secondary | ICD-10-CM | POA: Diagnosis not present

## 2014-03-28 DIAGNOSIS — R198 Other specified symptoms and signs involving the digestive system and abdomen: Secondary | ICD-10-CM

## 2014-03-28 DIAGNOSIS — L03319 Cellulitis of trunk, unspecified: Principal | ICD-10-CM

## 2014-03-28 DIAGNOSIS — I700 Atherosclerosis of aorta: Secondary | ICD-10-CM | POA: Diagnosis not present

## 2014-03-28 DIAGNOSIS — R109 Unspecified abdominal pain: Secondary | ICD-10-CM

## 2014-03-29 ENCOUNTER — Telehealth: Payer: Self-pay | Admitting: Family Medicine

## 2014-03-29 NOTE — Telephone Encounter (Signed)
appt scheduled

## 2014-04-04 ENCOUNTER — Encounter: Payer: Self-pay | Admitting: Family Medicine

## 2014-04-04 ENCOUNTER — Ambulatory Visit (INDEPENDENT_AMBULATORY_CARE_PROVIDER_SITE_OTHER): Payer: Medicaid Other | Admitting: Family Medicine

## 2014-04-04 VITALS — BP 106/66 | HR 96 | Temp 96.4°F | Ht 62.0 in | Wt 336.0 lb

## 2014-04-04 DIAGNOSIS — N390 Urinary tract infection, site not specified: Secondary | ICD-10-CM

## 2014-04-04 DIAGNOSIS — L0390 Cellulitis, unspecified: Secondary | ICD-10-CM

## 2014-04-04 DIAGNOSIS — L0291 Cutaneous abscess, unspecified: Secondary | ICD-10-CM

## 2014-04-04 DIAGNOSIS — R300 Dysuria: Secondary | ICD-10-CM

## 2014-04-04 MED ORDER — CIPROFLOXACIN HCL 500 MG PO TABS: 500.0000 mg | ORAL_TABLET | Freq: Two times a day (BID) | ORAL | Status: DC

## 2014-04-04 MED ORDER — HYOSCYAMINE SULFATE 0.125 MG SL SUBL: 0.1250 mg | SUBLINGUAL_TABLET | SUBLINGUAL | Status: DC | PRN

## 2014-04-04 NOTE — Progress Notes (Signed)
   Subjective:    Patient ID: [name], female    DOB: 07-13-1961, 53 y.o.   MRN: [id number]  HPI This 53 y.o. female presents for evaluation of cellulitis and drainage from umbilical ulcer and wound For last few weeks.  She did have CT scan which did not show abscess or fistula.  She has been. She c/o abdominal cramps.   Review of Systems C/o abdominal cramping and umbilical wound No chest pain, SOB, HA, dizziness, vision change, N/V, diarrhea, constipation, dysuria, urinary urgency or frequency, myalgias, arthralgias or rash.     Objective:   Physical Exam Vital signs noted  Well developed well nourished obese female.  HEENT - Head atraumatic Normocephalic Respiratory - Lungs CTA bilateral Cardiac - RRR S1 and S2 without murmur Skin - ulcer left side of umbilicus packed with 4x4 and taped secure      Assessment & Plan:  Wound cellulitis - Plan: AMB referral to wound care center, ciprofloxacin (CIPRO) 500 MG tablet  Abdominal cramps - Levsin 0.125mg  po q 6h prn  Deatra Canter FNP

## 2014-04-09 ENCOUNTER — Other Ambulatory Visit: Payer: Self-pay | Admitting: *Deleted

## 2014-04-09 ENCOUNTER — Encounter: Payer: Self-pay | Admitting: *Deleted

## 2014-04-09 ENCOUNTER — Telehealth: Payer: Self-pay | Admitting: Neurology

## 2014-04-09 DIAGNOSIS — G4733 Obstructive sleep apnea (adult) (pediatric): Secondary | ICD-10-CM

## 2014-04-09 DIAGNOSIS — E662 Morbid (severe) obesity with alveolar hypoventilation: Secondary | ICD-10-CM

## 2014-04-09 NOTE — Telephone Encounter (Signed)
I called and spoke with the patient about her recent sleep study results. I informed the patient that the study revealed hypoventilation syndrome, mild obstructive sleep apnea and severe hypoxemia. Dr. Vickey Huger recommend starting BiPAP therapy at home along with oxygen, so I will send the order to [facility]. I will fax a copy of the report to Dr. Terrace Arabia, Dr. Rudi Heap and Darrol Angel, [facility] offices and mail a copy to the patient along with a follow up instruction letter.

## 2014-04-11 ENCOUNTER — Telehealth: Payer: Self-pay | Admitting: Nurse Practitioner

## 2014-04-11 MED ORDER — HUGO ROLLING WALKER ELITE MISC: 1.0000 | Freq: Every day | Status: DC

## 2014-04-11 NOTE — Telephone Encounter (Signed)
rx ready for pickup 

## 2014-04-12 ENCOUNTER — Telehealth: Payer: Self-pay | Admitting: Nurse Practitioner

## 2014-04-12 NOTE — Telephone Encounter (Signed)
You can let someone else sign it isnt anything that someone else cant sign

## 2014-04-12 NOTE — Telephone Encounter (Signed)
Patient aware to pick up 

## 2014-04-12 NOTE — Telephone Encounter (Signed)
Rx on you r desk to sign when you get back on the 15th patient aware.

## 2014-04-30 ENCOUNTER — Emergency Department (HOSPITAL_COMMUNITY): Payer: Medicaid Other

## 2014-04-30 ENCOUNTER — Encounter (HOSPITAL_COMMUNITY): Payer: Self-pay | Admitting: Emergency Medicine

## 2014-04-30 ENCOUNTER — Emergency Department (HOSPITAL_COMMUNITY)
Admission: EM | Admit: 2014-04-30 | Discharge: 2014-04-30 | Disposition: A | Discharge status: Home / Self Care | Payer: Medicaid Other | Attending: Emergency Medicine | Admitting: Emergency Medicine

## 2014-04-30 DIAGNOSIS — Z90710 Acquired absence of both cervix and uterus: Secondary | ICD-10-CM | POA: Insufficient documentation

## 2014-04-30 DIAGNOSIS — E119 Type 2 diabetes mellitus without complications: Secondary | ICD-10-CM | POA: Insufficient documentation

## 2014-04-30 DIAGNOSIS — R109 Unspecified abdominal pain: Secondary | ICD-10-CM | POA: Insufficient documentation

## 2014-04-30 DIAGNOSIS — I10 Essential (primary) hypertension: Secondary | ICD-10-CM | POA: Insufficient documentation

## 2014-04-30 DIAGNOSIS — F17200 Nicotine dependence, unspecified, uncomplicated: Secondary | ICD-10-CM | POA: Insufficient documentation

## 2014-04-30 DIAGNOSIS — E860 Dehydration: Secondary | ICD-10-CM

## 2014-04-30 DIAGNOSIS — Z7982 Long term (current) use of aspirin: Secondary | ICD-10-CM | POA: Insufficient documentation

## 2014-04-30 DIAGNOSIS — D531 Other megaloblastic anemias, not elsewhere classified: Secondary | ICD-10-CM

## 2014-04-30 DIAGNOSIS — R112 Nausea with vomiting, unspecified: Secondary | ICD-10-CM

## 2014-04-30 DIAGNOSIS — L98491 Non-pressure chronic ulcer of skin of other sites limited to breakdown of skin: Secondary | ICD-10-CM

## 2014-04-30 DIAGNOSIS — J209 Acute bronchitis, unspecified: Secondary | ICD-10-CM

## 2014-04-30 DIAGNOSIS — I9589 Other hypotension: Secondary | ICD-10-CM

## 2014-04-30 DIAGNOSIS — Z9089 Acquired absence of other organs: Secondary | ICD-10-CM | POA: Insufficient documentation

## 2014-04-30 DIAGNOSIS — E785 Hyperlipidemia, unspecified: Secondary | ICD-10-CM | POA: Insufficient documentation

## 2014-04-30 DIAGNOSIS — Z79899 Other long term (current) drug therapy: Secondary | ICD-10-CM | POA: Insufficient documentation

## 2014-04-30 DIAGNOSIS — R197 Diarrhea, unspecified: Secondary | ICD-10-CM

## 2014-04-30 DIAGNOSIS — K219 Gastro-esophageal reflux disease without esophagitis: Secondary | ICD-10-CM | POA: Insufficient documentation

## 2014-04-30 DIAGNOSIS — I2510 Atherosclerotic heart disease of native coronary artery without angina pectoris: Secondary | ICD-10-CM | POA: Insufficient documentation

## 2014-04-30 DIAGNOSIS — D539 Nutritional anemia, unspecified: Secondary | ICD-10-CM | POA: Insufficient documentation

## 2014-04-30 LAB — CBC WITH DIFFERENTIAL/PLATELET
Basophils Absolute: 0.1 10*3/uL (ref 0.0–0.1)
Basophils Relative: 1 % (ref 0–1)
Eosinophils Absolute: 0.4 10*3/uL (ref 0.0–0.7)
Eosinophils Relative: 6 % — ABNORMAL HIGH (ref 0–5)
HCT: 29.4 % — ABNORMAL LOW (ref 36.0–46.0)
Hemoglobin: 9.8 g/dL — ABNORMAL LOW (ref 12.0–15.0)
Lymphocytes Relative: 37 % (ref 12–46)
Lymphs Abs: 2.4 10*3/uL (ref 0.7–4.0)
MCH: 34.4 pg — ABNORMAL HIGH (ref 26.0–34.0)
MCHC: 33.3 g/dL (ref 30.0–36.0)
MCV: 103.2 fL — ABNORMAL HIGH (ref 78.0–100.0)
Monocytes Absolute: 0.4 10*3/uL (ref 0.1–1.0)
Monocytes Relative: 6 % (ref 3–12)
Neutro Abs: 3.3 10*3/uL (ref 1.7–7.7)
Neutrophils Relative %: 50 % (ref 43–77)
Platelets: 172 10*3/uL (ref 150–400)
RBC: 2.85 MIL/uL — ABNORMAL LOW (ref 3.87–5.11)
RDW: 15.2 % (ref 11.5–15.5)
WBC: 6.6 10*3/uL (ref 4.0–10.5)

## 2014-04-30 LAB — COMPREHENSIVE METABOLIC PANEL
ALT: 11 U/L (ref 0–35)
AST: 13 U/L (ref 0–37)
Albumin: 3.9 g/dL (ref 3.5–5.2)
Alkaline Phosphatase: 69 U/L (ref 39–117)
BUN: 49 mg/dL — ABNORMAL HIGH (ref 6–23)
CO2: 22 mEq/L (ref 19–32)
Calcium: 9.6 mg/dL (ref 8.4–10.5)
Chloride: 105 mEq/L (ref 96–112)
Creatinine, Ser: 2.06 mg/dL — ABNORMAL HIGH (ref 0.50–1.10)
GFR calc Af Amer: 30 mL/min — ABNORMAL LOW (ref >90)
GFR calc non Af Amer: 26 mL/min — ABNORMAL LOW (ref >90)
Glucose, Bld: 116 mg/dL — ABNORMAL HIGH (ref 70–99)
Potassium: 4.4 mEq/L (ref 3.7–5.3)
Sodium: 140 mEq/L (ref 137–147)
Total Bilirubin: <0.2 mg/dL — ABNORMAL LOW (ref 0.3–1.2)
Total Protein: 7 g/dL (ref 6.0–8.3)

## 2014-04-30 LAB — URINALYSIS, ROUTINE W REFLEX MICROSCOPIC
Bilirubin Urine: NEGATIVE
Glucose, UA: NEGATIVE mg/dL
Hgb urine dipstick: NEGATIVE
Ketones, ur: NEGATIVE mg/dL
Leukocytes, UA: NEGATIVE
Nitrite: NEGATIVE
Protein, ur: NEGATIVE mg/dL
Specific Gravity, Urine: 1.01 (ref 1.005–1.030)
Urobilinogen, UA: 0.2 mg/dL (ref 0.0–1.0)
pH: 5.5 (ref 5.0–8.0)

## 2014-04-30 LAB — FERRITIN: Ferritin: 55 ng/mL (ref 10–291)

## 2014-04-30 LAB — FOLATE: Folate: 6.8 ng/mL

## 2014-04-30 LAB — VITAMIN B12: Vitamin B-12: 286 pg/mL (ref 211–911)

## 2014-04-30 LAB — RETICULOCYTES
RBC.: 2.84 MIL/uL — ABNORMAL LOW (ref 3.87–5.11)
Retic Count, Absolute: 45.4 10*3/uL (ref 19.0–186.0)
Retic Ct Pct: 1.6 % (ref 0.4–3.1)

## 2014-04-30 LAB — IRON AND TIBC
Iron: 68 ug/dL (ref 42–135)
Saturation Ratios: 26 % (ref 20–55)
TIBC: 263 ug/dL (ref 250–470)
UIBC: 195 ug/dL (ref 125–400)

## 2014-04-30 LAB — PRO B NATRIURETIC PEPTIDE: Pro B Natriuretic peptide (BNP): 402.5 pg/mL — ABNORMAL HIGH (ref 0–125)

## 2014-04-30 MED ORDER — SULFAMETHOXAZOLE-TMP DS 800-160 MG PO TABS
1.0000 | ORAL_TABLET | Freq: Once | ORAL | Status: AC
  Administered 2014-04-30: 1 via ORAL

## 2014-04-30 MED ORDER — SULFAMETHOXAZOLE-TRIMETHOPRIM 800-160 MG PO TABS: 1.0000 | ORAL_TABLET | Freq: Two times a day (BID) | ORAL | Status: DC

## 2014-04-30 MED ORDER — SODIUM CHLORIDE 0.9 % IV BOLUS (SEPSIS)
1000.0000 mL | Freq: Once | INTRAVENOUS | Status: AC
  Administered 2014-04-30: 1000 mL via INTRAVENOUS

## 2014-04-30 MED ORDER — IPRATROPIUM BROMIDE 0.02 % IN SOLN: 0.5000 mg | Freq: Once | RESPIRATORY_TRACT | Status: DC

## 2014-04-30 MED ORDER — ONDANSETRON HCL 4 MG/2ML IJ SOLN
4.0000 mg | Freq: Once | INTRAMUSCULAR | Status: AC
  Administered 2014-04-30: 4 mg via INTRAVENOUS
  Filled 2014-04-30: qty 2

## 2014-04-30 MED ORDER — AEROCHAMBER Z-STAT PLUS/MEDIUM MISC: 1.0000 | Freq: Once | Status: DC

## 2014-04-30 MED ORDER — ALBUTEROL SULFATE (2.5 MG/3ML) 0.083% IN NEBU: 5.0000 mg | INHALATION_SOLUTION | Freq: Once | RESPIRATORY_TRACT | Status: DC

## 2014-04-30 MED ORDER — ONDANSETRON HCL 4 MG PO TABS: 4.0000 mg | ORAL_TABLET | Freq: Three times a day (TID) | ORAL | Status: DC | PRN

## 2014-04-30 MED ORDER — SULFAMETHOXAZOLE-TMP DS 800-160 MG PO TABS
ORAL_TABLET | ORAL | Status: AC
  Filled 2014-04-30: qty 1

## 2014-04-30 MED ORDER — ALBUTEROL SULFATE HFA 108 (90 BASE) MCG/ACT IN AERS
INHALATION_SPRAY | RESPIRATORY_TRACT | Status: AC
  Filled 2014-04-30: qty 6.7

## 2014-04-30 MED ORDER — ONDANSETRON HCL 4 MG/2ML IJ SOLN
INTRAMUSCULAR | Status: AC
  Filled 2014-04-30: qty 2

## 2014-04-30 MED ORDER — ONDANSETRON HCL 4 MG/2ML IJ SOLN: 4.0000 mg | Freq: Once | INTRAMUSCULAR | Status: DC

## 2014-04-30 MED ORDER — ONDANSETRON HCL 4 MG/2ML IJ SOLN
4.0000 mg | Freq: Once | INTRAMUSCULAR | Status: AC
  Administered 2014-04-30: 4 mg via INTRAVENOUS

## 2014-04-30 MED ORDER — LEVOFLOXACIN 500 MG PO TABS
ORAL_TABLET | ORAL | Status: DC
Start: 2014-04-30 — End: 2014-04-30
  Filled 2014-04-30: qty 1

## 2014-04-30 MED ORDER — LEVOFLOXACIN 500 MG PO TABS: 500.0000 mg | ORAL_TABLET | Freq: Every day | ORAL | Status: DC

## 2014-04-30 MED ORDER — LEVOFLOXACIN 500 MG PO TABS
500.0000 mg | ORAL_TABLET | Freq: Once | ORAL | Status: AC
  Administered 2014-04-30: 500 mg via ORAL

## 2014-04-30 MED ORDER — IPRATROPIUM-ALBUTEROL 0.5-2.5 (3) MG/3ML IN SOLN
3.0000 mL | RESPIRATORY_TRACT | Status: DC
  Administered 2014-04-30: 3 mL via RESPIRATORY_TRACT
  Filled 2014-04-30: qty 3

## 2014-04-30 MED ORDER — FENTANYL CITRATE 0.05 MG/ML IJ SOLN
50.0000 ug | INTRAMUSCULAR | Status: DC | PRN
  Administered 2014-04-30: 50 ug via INTRAVENOUS
  Filled 2014-04-30: qty 2

## 2014-04-30 MED ORDER — ALBUTEROL SULFATE HFA 108 (90 BASE) MCG/ACT IN AERS: 2.0000 | INHALATION_SPRAY | RESPIRATORY_TRACT | Status: DC | PRN

## 2014-04-30 MED ORDER — SODIUM CHLORIDE 0.9 % IV SOLN: INTRAVENOUS | Status: DC

## 2014-04-30 NOTE — ED Provider Notes (Signed)
CSN: [id number]     Arrival date & time 04/30/14  1451 History   First MD Initiated Contact with Patient 04/30/14 1500     Chief Complaint  Patient presents with  . Shortness of Breath  . Emesis     (Consider location/radiation/quality/duration/timing/severity/associated sxs/prior Treatment) HPI Patient reports she started having a lesion near her umbilicus about 2 months ago. She reports she had a culture done and has been on 2 rounds of the same antibiotic which appears to be Cipro. She states it has made no difference in the size of the lesion or the amount of drainage. She just finished his second round of antibiotics about one to 2 weeks ago. She actually states the wound seems to be getting worse and draining more. She has an appointment on July 8 to be seen at the wound Center in [place]. She reports she started getting fever subjective yesterday with chills. She has had nausea and vomited twice yesterday. She states she has a bad taste in her mouth and all fluids case bad. She had diarrhea yesterday about 5 times described as loose.   She reports she has had swelling of her lower legs for the past 2-3 months. She denies any lesions on her legs or any drainage from them.  She also states she's had a cough for the past 3-4 weeks and is coughing up green sputum. She states she feels short of breath. She denies wheezing. She denies chest pain. She states she's never been admitted to the hospital for COPD, emphysema, pneumonia or bronchitis. She states she's never been on steroids for breathing problems.   PCP Ignacia Bayley FP  Past Medical History  Diagnosis Date  . GERD (gastroesophageal reflux disease)   . Hyperlipidemia   . Noncompliance   . CAD (coronary artery disease)     post PTCA with stent placement  . Morbid obesity   . Diabetes mellitus without complication   . Hypertension   . HA (headache)   . Left-sided face pain    Past Surgical History  Procedure  Laterality Date  . Cholecystectomy    . Abdominal hysterectomy    . Ptca      WITH STENT PLACEMENT  . Colonoscopy N/A 04/10/2013    Procedure: COLONOSCOPY;  Surgeon: Malissa Hippo, MD;  Location: [facility];  Service: Endoscopy;  Laterality: N/A;  . Eye surgery     Family History  Problem Relation Age of Onset  . Coronary artery disease Father   . Cancer - Colon Father   . Breast cancer Mother   . Diabetes Father   . High Cholesterol Father    History  Substance Use Topics  . Smoking status: Current Every Day Smoker -- 1.00 packs/day for 35 years    Types: Cigarettes  . Smokeless tobacco: Never Used  . Alcohol Use: No   Lives at home Lives with spouse and GS Uses CPAP at night Uses oxygen 2 lpm [place] at night    OB History   Grav Para Term Preterm Abortions TAB SAB Ect Mult Living                 Review of Systems  All other systems reviewed and are negative.     Allergies  Morphine and related  Home Medications   Prior to Admission medications   Medication Sig Start Date End Date Taking? Authorizing Provider  aspirin 325 MG tablet Take 325 mg by mouth daily.  Historical Provider, MD  Aspirin-Acetaminophen-Caffeine (EXCEDRIN PO) Take by mouth as needed.    Historical Provider, MD  Benzyl Alcohol 5 % LOTN Apply 1 application topically once. 02/26/14   Mary-Margaret Daphine DeutscherMartin, FNP  calcium carbonate (TUMS - DOSED IN MG ELEMENTAL CALCIUM) 500 MG chewable tablet Chew 1 tablet by mouth daily.    Historical Provider, MD  ciprofloxacin (CIPRO) 500 MG tablet Take 1 tablet (500 mg total) by mouth 2 (two) times daily. 04/04/14   Deatra CanterWilliam J Oxford, FNP  cyclobenzaprine (FLEXERIL) 10 MG tablet Take 1 tablet (10 mg total) by mouth 3 (three) times daily as needed for muscle spasms. 02/18/14   Mary-Margaret Daphine DeutscherMartin, FNP  furosemide (LASIX) 80 MG tablet Take 1 tablet (80 mg total) by mouth daily. 02/18/14   Mary-Margaret Daphine DeutscherMartin, FNP  gabapentin (NEURONTIN) 300 MG capsule Take 1  capsule (300 mg total) by mouth 4 (four) times daily. 02/18/14   Mary-Margaret Daphine DeutscherMartin, FNP  HYDROcodone-acetaminophen (NORCO) 5-325 MG per tablet Take 1 tablet by mouth every 6 (six) hours as needed for moderate pain. 03/25/14   Deatra CanterWilliam J Oxford, FNP  hyoscyamine (LEVSIN/SL) 0.125 MG SL tablet Place 1 tablet (0.125 mg total) under the tongue every 4 (four) hours as needed. 04/04/14   Deatra CanterWilliam J Oxford, FNP  lisinopril (PRINIVIL,ZESTRIL) 20 MG tablet Take 1 tablet (20 mg total) by mouth daily. 02/18/14   Mary-Margaret Daphine DeutscherMartin, FNP  metFORMIN (GLUCOPHAGE-XR) 500 MG 24 hr tablet Take 1 tablet (500 mg total) by mouth daily with breakfast. 02/18/14   Mary-Margaret Daphine DeutscherMartin, FNP  metolazone (ZAROXOLYN) 2.5 MG tablet 1 tablet Po every Monday 02/21/14   Mary-Margaret Daphine DeutscherMartin, FNP  Misc. Devices (HUGO ROLLING WALKER ELITE) MISC 1 each by Does not apply route daily. 04/11/14   Mary-Margaret Daphine DeutscherMartin, FNP  naproxen (NAPROSYN) 500 MG tablet Take 1 tablet (500 mg total) by mouth 2 (two) times daily with a meal. 03/25/14   Mary-Margaret Daphine DeutscherMartin, FNP  omeprazole (PRILOSEC) 40 MG capsule Take 1 capsule (40 mg total) by mouth daily. 02/18/14   Mary-Margaret Daphine DeutscherMartin, FNP  rosuvastatin (CRESTOR) 20 MG tablet Take 1 tablet (20 mg total) by mouth daily. 02/18/14   Mary-Margaret Daphine DeutscherMartin, FNP   BP 87/61  Pulse 84  Temp(Src) 98.1 F (36.7 C) (Oral)  Resp 18  Ht 5\' 2"  (1.575 m)  Wt 325 lb (147.419 kg)  BMI 59.43 kg/m2  SpO2 99%  Vital signs normal except for hypotension  Physical Exam  Nursing note and vitals reviewed. Constitutional: She is oriented to person, place, and time. She appears well-developed and well-nourished.  Non-toxic appearance. She does not appear ill. No distress.  Morbidly obese   HENT:  Head: Normocephalic and atraumatic.  Right Ear: External ear normal.  Left Ear: External ear normal.  Nose: Nose normal. No mucosal edema or rhinorrhea.  Mouth/Throat: Mucous membranes are normal. No dental abscesses or  uvula swelling.  Extremely poor dentition, many missing teeth, ones present have a lot of tarter build up and are rotted  Eyes: Conjunctivae and EOM are normal. Pupils are equal, round, and reactive to light.  Neck: Normal range of motion and full passive range of motion without pain. Neck supple.  Cardiovascular: Normal rate, regular rhythm and normal heart sounds.  Exam reveals no gallop and no friction rub.   No murmur heard. Pulmonary/Chest: Effort normal. No accessory muscle usage. No respiratory distress. She has decreased breath sounds. She has wheezes. She has no rhonchi. She has no rales. She exhibits no tenderness and no crepitus.  Patient has diffuse scattered high-pitched wheezes  Abdominal: Soft. Normal appearance and bowel sounds are normal. She exhibits no distension. There is no tenderness. There is no rebound and no guarding.    Patient has a 2.5 x 3 cm ulcerated area on the left side of her umbilicus. Her dressing has bloody serosanguineous drainage on it. Please see photo  Patient complains of diffuse lower abdominal discomfort.  Musculoskeletal: Normal range of motion. She exhibits no edema and no tenderness.  Patient has diffuse swelling of her lower legs that is nonpitting. There are no open lesions.  Neurological: She is alert and oriented to person, place, and time. She has normal strength. No cranial nerve deficit.  Skin: Skin is warm, dry and intact. No rash noted. No erythema. No pallor.  Psychiatric: She has a normal mood and affect. Her speech is normal and behavior is normal. Her mood appears not anxious.         ED Course  Procedures (including critical care time)  Medications  0.9 %  sodium chloride infusion (not administered)  fentaNYL (SUBLIMAZE) injection 50 mcg (50 mcg Intravenous Given 04/30/14 1540)  ipratropium-albuterol (DUONEB) 0.5-2.5 (3) MG/3ML nebulizer solution 3 mL (3 mLs Nebulization Given 04/30/14 1546)  aerochamber Z-Stat Plus/medium 1  each (not administered)  albuterol (PROVENTIL HFA;VENTOLIN HFA) 108 (90 BASE) MCG/ACT inhaler 2 puff (not administered)  sodium chloride 0.9 % bolus 1,000 mL (1,000 mLs Intravenous New Bag/Given 04/30/14 1540)  ondansetron (ZOFRAN) injection 4 mg (4 mg Intravenous Given 04/30/14 1540)  levofloxacin (LEVAQUIN) tablet 500 mg (500 mg Oral Given 04/30/14 1842)  sulfamethoxazole-trimethoprim (BACTRIM DS) 800-160 MG per tablet 1 tablet (1 tablet Oral Given 04/30/14 1842)    Patient given a liter normal saline for her hypotension. Her blood pressure improved. Most likely from dehydration with her vomiting and diarrhea. I obtained a wound culture from the periumbilical wound.  The patient was given an albuterol/Atrovent nebulizer treatment when she was rechecked her wheezing was gone. She was given a spacer and inhaler to use at home.  The patient was given the results of her test and her scans from tonight. An anemia panel was sent by her primary care doctor can followup on later. I am going to add Levaquin and Septra to her antibiotic regimen. The Levaquin should help with her respiratory rhonchi is and also hirsute ammonias was sensitive to it, Septra was added in case she is developing a MRSA or staph superinfection. Patient is feeling better and feels ready for discharge.   Labs Review   Results for orders placed during the hospital encounter of 04/30/14  CBC WITH DIFFERENTIAL      Result Value Ref Range   WBC 6.6  4.0 - 10.5 K/uL   RBC 2.85 (*) 3.87 - 5.11 MIL/uL   Hemoglobin 9.8 (*) 12.0 - 15.0 g/dL   HCT 16.1 (*) 09.6 - 04.5 %   MCV 103.2 (*) 78.0 - 100.0 fL   MCH 34.4 (*) 26.0 - 34.0 pg   MCHC 33.3  30.0 - 36.0 g/dL   RDW 40.9  81.1 - 91.4 %   Platelets 172  150 - 400 K/uL   Neutrophils Relative % 50  43 - 77 %   Lymphocytes Relative 37  12 - 46 %   Monocytes Relative 6  3 - 12 %   Eosinophils Relative 6 (*) 0 - 5 %   Basophils Relative 1  0 - 1 %   Neutro Abs 3.3  1.7 -  7.7 K/uL    Lymphs Abs 2.4  0.7 - 4.0 K/uL   Monocytes Absolute 0.4  0.1 - 1.0 K/uL   Eosinophils Absolute 0.4  0.0 - 0.7 K/uL   Basophils Absolute 0.1  0.0 - 0.1 K/uL   WBC Morphology ATYPICAL LYMPHOCYTES     Smear Review LARGE PLATELETS PRESENT    COMPREHENSIVE METABOLIC PANEL      Result Value Ref Range   Sodium 140  137 - 147 mEq/L   Potassium 4.4  3.7 - 5.3 mEq/L   Chloride 105  96 - 112 mEq/L   CO2 22  19 - 32 mEq/L   Glucose, Bld 116 (*) 70 - 99 mg/dL   BUN 49 (*) 6 - 23 mg/dL   Creatinine, Ser 1.61 (*) 0.50 - 1.10 mg/dL   Calcium 9.6  8.4 - 09.6 mg/dL   Total Protein 7.0  6.0 - 8.3 g/dL   Albumin 3.9  3.5 - 5.2 g/dL   AST 13  0 - 37 U/L   ALT 11  0 - 35 U/L   Alkaline Phosphatase 69  39 - 117 U/L   Total Bilirubin <0.2 (*) 0.3 - 1.2 mg/dL   GFR calc non Af Amer 26 (*) >90 mL/min   GFR calc Af Amer 30 (*) >90 mL/min  PRO B NATRIURETIC PEPTIDE      Result Value Ref Range   Pro B Natriuretic peptide (BNP) 402.5 (*) 0 - 125 pg/mL  URINALYSIS, ROUTINE W REFLEX MICROSCOPIC      Result Value Ref Range   Color, Urine YELLOW  YELLOW   APPearance CLEAR  CLEAR   Specific Gravity, Urine 1.010  1.005 - 1.030   pH 5.5  5.0 - 8.0   Glucose, UA NEGATIVE  NEGATIVE mg/dL   Hgb urine dipstick NEGATIVE  NEGATIVE   Bilirubin Urine NEGATIVE  NEGATIVE   Ketones, ur NEGATIVE  NEGATIVE mg/dL   Protein, ur NEGATIVE  NEGATIVE mg/dL   Urobilinogen, UA 0.2  0.0 - 1.0 mg/dL   Nitrite NEGATIVE  NEGATIVE   Leukocytes, UA NEGATIVE  NEGATIVE  RETICULOCYTES      Result Value Ref Range   Retic Ct Pct 1.6  0.4 - 3.1 %   RBC. 2.84 (*) 3.87 - 5.11 MIL/uL   Retic Count, Manual 45.4  19.0 - 186.0 K/uL   Laboratory interpretation all normal except stable anemia which appears to be macrocytic with MCV of 103, stable renal insufficiency      Results for orders placed in visit on 03/25/14  AEROBIC CULTURE      Result Value Ref Range   Aerobic Bacterial Culture Final report (*)    Result 1 Comment (*)     Result 2 Routine flora     ANTIMICROBIAL SUSCEPTIBILITY Comment    POCT CBC      Result Value Ref Range   WBC 8.9  4.6 - 10.2 K/uL   Lymph, poc 3.3  0.6 - 3.4   POC LYMPH PERCENT 37.0  10 - 50 %L   POC Granulocyte 5..0  2 - 6.9   Granulocyte percent 56.0  37 - 80 %G   RBC 3.2 (*) 4.04 - 5.48 M/uL   Hemoglobin 10.2 (*) 12.2 - 16.2 g/dL   HCT, POC 04.5 (*) 40.9 - 47.9 %   MCV 102.4 (*) 80 - 97 fL   MCH, POC 32.4 (*) 27 - 31.2 pg   MCHC 31.7 (*) 31.8 - 35.4 g/dL   RDW, POC  15.7     Platelet Count, POC 154.0  142 - 424 K/uL   MPV 10.8  0 - 99.8 fL   Final report for the wound culture noted above grew out pseudomonas aeruginosis with moderate growth. It was sensitive to Cipro, Levaquin, tobramycin, cefepime, meropenem    Imaging Review Ct Abdomen Pelvis Wo Contrast  04/30/2014   CLINICAL DATA:  Nausea vomiting and diarrhea.  EXAM: CT ABDOMEN AND PELVIS WITHOUT CONTRAST  TECHNIQUE: Multidetector CT imaging of the abdomen and pelvis was performed following the standard protocol without IV contrast.  COMPARISON:  03/28/2014  FINDINGS: The lung bases are clear.  There is no pleural effusion or edema.  No focal liver abnormality. The patient is status post coli cystectomy. No biliary dilatation. Normal appearance of the spleen. The adrenal glands are both normal. The kidneys are both unremarkable. No nephrolithiasis or obstructive uropathy. Urinary bladder appears normal. Previous hysterectomy.  Calcified atherosclerotic disease involves the abdominal aorta. No aneurysm. There is no upper abdominal adenopathy identified. There is no pelvic or inguinal adenopathy.  The stomach appears normal. The small bowel loops have a normal course and caliber without obstruction. Normal appearance of the proximal colon. The distal colon is also normal.  The patient is status post mesh repair of ventral abdominal wall hernia. Similar to the previous exam is recurrent ventral abdominal wall hernia which contains fat  only, image 85/series 6.  Review of the visualized osseous structures is significant for mild lumbar degenerative disc disease.  IMPRESSION: 1. No acute findings within the abdomen or pelvis. 2. Atherosclerotic disease. 3. Status post ventral abdominal wall hernia repair. Unchanged recurrent fat containing hernia involving the ventral abdominal wall.   Electronically Signed   By: Signa Kell M.D.   On: 04/30/2014 18:14   Dg Chest 2 View  04/30/2014   CLINICAL DATA:  Weakness, shortness of breath  EXAM: CHEST  2 VIEW  COMPARISON:  04/08/2013  FINDINGS: Cardiomegaly as before. No edema or CHF. Negative for pneumonia, collapse or consolidation. No pleural effusion or pneumothorax. Trachea midline. Atherosclerosis of the aorta.  IMPRESSION: Cardiomegaly without acute process.  Stable exam.   Electronically Signed   By: Ruel Favors M.D.   On: 04/30/2014 17:17   US Venous Img Lower Bilateral  04/30/2014   CLINICAL DATA:  Shortness of breath, bilateral leg swelling  EXAM: BILATERAL LOWER EXTREMITY VENOUS DOPPLER ULTRASOUND  TECHNIQUE: Gray-scale sonography with graded compression, as well as color Doppler and duplex ultrasound were performed to evaluate the lower extremity deep venous systems from the level of the common femoral vein and including the common femoral, femoral, profunda femoral, popliteal and calf veins including the posterior tibial, peroneal and gastrocnemius veins when visible. The superficial great saphenous vein was also interrogated. Spectral Doppler was utilized to evaluate flow at rest and with distal augmentation maneuvers in the common femoral, femoral and popliteal veins.  COMPARISON:  None.  FINDINGS: RIGHT LOWER EXTREMITY  Common Femoral Vein: No evidence of thrombus. Normal compressibility, respiratory phasicity and response to augmentation.  Saphenofemoral Junction: No evidence of thrombus. Normal compressibility and flow on color Doppler imaging.  Profunda Femoral Vein: No evidence  of thrombus. Normal compressibility and flow on color Doppler imaging.  Femoral Vein: No evidence of thrombus. Normal compressibility, respiratory phasicity and response to augmentation.  Popliteal Vein: No evidence of thrombus. Normal compressibility, respiratory phasicity and response to augmentation.  Calf Veins: The calf veins were not well visualized due to the patient's body habitus  and inability to adequately compress due to patient tolerance.  Superficial Great Saphenous Vein: No evidence of thrombus. Normal compressibility and flow on color Doppler imaging.  Venous Reflux:  None.  Other Findings:  None.  LEFT LOWER EXTREMITY  Common Femoral Vein: No evidence of thrombus. Normal compressibility, respiratory phasicity and response to augmentation.  Saphenofemoral Junction: No evidence of thrombus. Normal compressibility and flow on color Doppler imaging.  Profunda Femoral Vein: No evidence of thrombus. Normal compressibility and flow on color Doppler imaging.  Femoral Vein: No evidence of thrombus. Normal compressibility, respiratory phasicity and response to augmentation.  Popliteal Vein: No evidence of thrombus. Normal compressibility, respiratory phasicity and response to augmentation.  Calf Veins: The calf veins were not well visualized due to the patient's body habitus and inability to adequately compress due to patient tolerance.  Superficial Great Saphenous Vein: No evidence of thrombus. Normal compressibility and flow on color Doppler imaging.  Venous Reflux:  None.  Other Findings:  None.  IMPRESSION: Slightly limited exam although no deep venous thrombosis is noted.   Electronically Signed   By: Alcide CleverMark  Lukens M.D.   On: 04/30/2014 16:55     EKG Interpretation   Date/Time:  Tuesday April 30 2014 15:06:26 EDT Ventricular Rate:  77 PR Interval:  170 QRS Duration: 123 QT Interval:  430 QTC Calculation: 487 R Axis:   136 Text Interpretation:  Sinus rhythm RBBB and LPFB Baseline wander No    significant change since last tracing  03 Aug 2013 Confirmed by [facility]   MD-I, IVA ([id number]) on 04/30/2014 4:47:13 PM      MDM   Final diagnoses:  Bronchitis, acute, with bronchospasm  Skin ulcer of abdomen, limited to breakdown of skin  Megaloblastic anemia  Nausea vomiting and diarrhea  Dehydration  Other specified hypotension    New Prescriptions   LEVOFLOXACIN (LEVAQUIN) 500 MG TABLET    Take 1 tablet (500 mg total) by mouth daily.   ONDANSETRON (ZOFRAN) 4 MG TABLET    Take 1 tablet (4 mg total) by mouth every 8 (eight) hours as needed for nausea or vomiting.   SULFAMETHOXAZOLE-TRIMETHOPRIM (SEPTRA DS) 800-160 MG PER TABLET    Take 1 tablet by mouth 2 (two) times daily.    Plan discharge   Devoria AlbeIva Knapp, MD, Franz DellFACEP     Iva L Knapp, MD 04/30/14 [id number]

## 2014-04-30 NOTE — ED Notes (Signed)
Pt c/o generalized weakness, SOB, n/v and abdominal pain. Pt states she has "wound" to mid abdomen and is scheduled to see a doctor for tx. Pt has hx of COPD and states she uses cpap machine at home. Pt states SOB "is worse than normal". Pt states vomiting began Monday afternoon.

## 2014-04-30 NOTE — Discharge Instructions (Signed)
Drink plenty of fluids so you don't get dehydrated again.. Avoid milk until the diarrhea is gone. Uses Zofran for nausea or vomiting. You can take Imodium over-the-counter for diarrhea. Stopped using peroxide on the ulcer near your belly button. He can wash it daily with soap and water and use antibiotic ointment on it instead. Take the new antibiotics until gone. Hopefully this will help with your bronchitis and also the wound near your belly button. Keep your appointment at the wound center on July 8. Recheck if you get a high fever, you get worsening redness or swelling around the wound, or you've Persistent vomiting and diarrhea and get dehydrated again.

## 2014-05-03 LAB — WOUND CULTURE: Gram Stain: NONE SEEN

## 2014-05-05 ENCOUNTER — Telehealth (HOSPITAL_BASED_OUTPATIENT_CLINIC_OR_DEPARTMENT_OTHER): Payer: Self-pay

## 2014-05-05 NOTE — Telephone Encounter (Signed)
Post ED Visit - Positive Culture Follow-up  Culture report reviewed by antimicrobial stewardship pharmacist: [x]  Wes Dulaney, Pharm.D., BCPS []  Celedonio MiyamotoJeremy Frens, Pharm.D., BCPS []  Georgina PillionElizabeth Martin, [address]Ph[address] BCPS []  LarchmontMinh Pham, [address]Ph[address]D., BCPS, AAHIVP []  Estella HuskMichelle Turner, Pharm.D., BCPS, AAHIVP []  Harvie JuniorNathan Cope, Pharm.D.  Positive Wound culture -> Pseudomonas Aeruginosa Treated with Levofloxacin & Sulfa-Trimeth, organism sensitive to the same and no further patient follow-up is required at this time.  Arvid RightClark, Patricia Dorn 05/05/2014, 11:04 PM

## 2014-05-15 ENCOUNTER — Encounter (HOSPITAL_BASED_OUTPATIENT_CLINIC_OR_DEPARTMENT_OTHER): Payer: Medicaid Other | Attending: General Surgery

## 2014-05-15 DIAGNOSIS — E119 Type 2 diabetes mellitus without complications: Secondary | ICD-10-CM | POA: Diagnosis not present

## 2014-05-15 DIAGNOSIS — I509 Heart failure, unspecified: Secondary | ICD-10-CM | POA: Insufficient documentation

## 2014-05-15 DIAGNOSIS — Z8673 Personal history of transient ischemic attack (TIA), and cerebral infarction without residual deficits: Secondary | ICD-10-CM | POA: Insufficient documentation

## 2014-05-15 DIAGNOSIS — I10 Essential (primary) hypertension: Secondary | ICD-10-CM | POA: Insufficient documentation

## 2014-05-15 DIAGNOSIS — L03319 Cellulitis of trunk, unspecified: Principal | ICD-10-CM

## 2014-05-15 DIAGNOSIS — Z79899 Other long term (current) drug therapy: Secondary | ICD-10-CM | POA: Insufficient documentation

## 2014-05-15 DIAGNOSIS — I2510 Atherosclerotic heart disease of native coronary artery without angina pectoris: Secondary | ICD-10-CM | POA: Insufficient documentation

## 2014-05-15 DIAGNOSIS — L02219 Cutaneous abscess of trunk, unspecified: Secondary | ICD-10-CM | POA: Insufficient documentation

## 2014-05-16 NOTE — Progress Notes (Signed)
[facility]  NAME:  [name], [name]                 ACCOUNT NO.:  [id number]  MEDICAL RECORD NO.:  [id number]      DATE OF BIRTH:  07-Jan-1961  PHYSICIAN:  Ardath SaxPeter Alijah Akram, M.D.      VISIT DATE:  05/15/2014                                  OFFICE VISIT   Stacey CopasBetty Giuffre is a 53 year old lady coming to the [facility] for the first time.  She is morbidly obese, weighs about 325 pounds.  She states that 2 months ago, she began draining from her umbilicus.  Looking at her umbilicus, she has a 1 cm area on the lateral side that is very inflamed and is weeping a serous material.  She states that it came on spontaneously, but she also states that she has got mesh in there from a previous hernia repair.  On her examination when she came here, she had a blood pressure of 79/54, respirations 19, temperature 97.6.  Her blood sugar was 92.  Her medicines are metformin, lisinopril, Lasix, gabapentin, metoprolol, and lovastatin.  She states that she has type 2 diabetes.  She has had congestive heart failure, and she also had a cerebral vascular accident.  She has got coronary artery disease.  I am going to treat this with silver alginate.  In review of her family doctor's notes, apparently he did a CT scan which showed no problem with the mesh, but I am suspicious and I will keep this in mind, so her diagnosis is cellulitis of the umbilicus, possible involvement with mesh from umbilical herniorrhaphy.  Other diagnoses are morbid obesity, coronary artery disease, hypertension, type 2 diabetes.     Ardath SaxPeter Casara Perrier, M.D.     PP/MEDQ  D:  05/15/2014  T:  05/16/2014  Job:  [id number]

## 2014-05-22 ENCOUNTER — Encounter: Payer: Self-pay | Admitting: Nurse Practitioner

## 2014-05-22 ENCOUNTER — Ambulatory Visit (INDEPENDENT_AMBULATORY_CARE_PROVIDER_SITE_OTHER): Payer: Medicaid Other | Admitting: Nurse Practitioner

## 2014-05-22 ENCOUNTER — Other Ambulatory Visit: Payer: Medicaid Other

## 2014-05-22 VITALS — BP 100/62 | HR 120 | Temp 97.9°F | Ht 62.0 in | Wt 325.4 lb

## 2014-05-22 DIAGNOSIS — E1149 Type 2 diabetes mellitus with other diabetic neurological complication: Secondary | ICD-10-CM

## 2014-05-22 DIAGNOSIS — E119 Type 2 diabetes mellitus without complications: Secondary | ICD-10-CM

## 2014-05-22 DIAGNOSIS — E1349 Other specified diabetes mellitus with other diabetic neurological complication: Secondary | ICD-10-CM

## 2014-05-22 DIAGNOSIS — G909 Disorder of the autonomic nervous system, unspecified: Secondary | ICD-10-CM

## 2014-05-22 DIAGNOSIS — E785 Hyperlipidemia, unspecified: Secondary | ICD-10-CM

## 2014-05-22 DIAGNOSIS — I2510 Atherosclerotic heart disease of native coronary artery without angina pectoris: Secondary | ICD-10-CM

## 2014-05-22 DIAGNOSIS — E0843 Diabetes mellitus due to underlying condition with diabetic autonomic (poly)neuropathy: Secondary | ICD-10-CM

## 2014-05-22 DIAGNOSIS — E1059 Type 1 diabetes mellitus with other circulatory complications: Secondary | ICD-10-CM

## 2014-05-22 DIAGNOSIS — L02219 Cutaneous abscess of trunk, unspecified: Secondary | ICD-10-CM | POA: Diagnosis not present

## 2014-05-22 DIAGNOSIS — Z713 Dietary counseling and surveillance: Secondary | ICD-10-CM

## 2014-05-22 DIAGNOSIS — K219 Gastro-esophageal reflux disease without esophagitis: Secondary | ICD-10-CM

## 2014-05-22 DIAGNOSIS — L03319 Cellulitis of trunk, unspecified: Secondary | ICD-10-CM | POA: Diagnosis not present

## 2014-05-22 DIAGNOSIS — R600 Localized edema: Secondary | ICD-10-CM

## 2014-05-22 DIAGNOSIS — I10 Essential (primary) hypertension: Secondary | ICD-10-CM | POA: Diagnosis not present

## 2014-05-22 DIAGNOSIS — R609 Edema, unspecified: Secondary | ICD-10-CM

## 2014-05-22 DIAGNOSIS — I2584 Coronary atherosclerosis due to calcified coronary lesion: Secondary | ICD-10-CM

## 2014-05-22 DIAGNOSIS — D696 Thrombocytopenia, unspecified: Secondary | ICD-10-CM

## 2014-05-22 DIAGNOSIS — G4700 Insomnia, unspecified: Secondary | ICD-10-CM

## 2014-05-22 DIAGNOSIS — F411 Generalized anxiety disorder: Secondary | ICD-10-CM

## 2014-05-22 LAB — POCT GLYCOSYLATED HEMOGLOBIN (HGB A1C): Hemoglobin A1C: 5.9

## 2014-05-22 LAB — GLUCOSE, CAPILLARY: Glucose-Capillary: 102 mg/dL — ABNORMAL HIGH (ref 70–99)

## 2014-05-22 NOTE — Patient Instructions (Signed)
Exercise to Lose Weight Exercise and a healthy diet may help you lose weight. Your doctor may suggest specific exercises. EXERCISE IDEAS AND TIPS  Choose low-cost things you enjoy doing, such as walking, bicycling, or exercising to workout videos.  Take stairs instead of the elevator.  Walk during your lunch break.  Park your car further away from work or school.  Go to a gym or an exercise class.  Start with 5 to 10 minutes of exercise each day. Build up to 30 minutes of exercise 4 to 6 days a week.  Wear shoes with good support and comfortable clothes.  Stretch before and after working out.  Work out until you breathe harder and your heart beats faster.  Drink extra water when you exercise.  Do not do so much that you hurt yourself, feel dizzy, or get very short of breath. Exercises that burn about 150 calories:  Running 1  miles in 15 minutes.  Playing volleyball for 45 to 60 minutes.  Washing and waxing a car for 45 to 60 minutes.  Playing touch football for 45 minutes.  Walking 1  miles in 35 minutes.  Pushing a stroller 1  miles in 30 minutes.  Playing basketball for 30 minutes.  Raking leaves for 30 minutes.  Bicycling 5 miles in 30 minutes.  Walking 2 miles in 30 minutes.  Dancing for 30 minutes.  Shoveling snow for 15 minutes.  Swimming laps for 20 minutes.  Walking up stairs for 15 minutes.  Bicycling 4 miles in 15 minutes.  Gardening for 30 to 45 minutes.  Jumping rope for 15 minutes.  Washing windows or floors for 45 to 60 minutes. Document Released: 11/27/2010 Document Revised: 01/17/2012 Document Reviewed: 11/27/2010 Exit[employer] Patient Information 2015 [employer]. This information is not intended to replace advice given to you by your health care provider. Make sure you discuss any questions you have with your health care provider.  

## 2014-05-22 NOTE — Addendum Note (Signed)
Addended by: Orma RenderHODGES, Kya Mayfield F on: 05/22/2014 05:37 PM   Modules accepted: Orders

## 2014-05-22 NOTE — Progress Notes (Signed)
Subjective:    Patient ID: Topanga J Valenti, female    DOB: 02/28/1961, 53 y.o.   MRN: [id number]  Patient here today for follow up of chronic medical problems.  Diabetes She presents for her follow-up diabetic visit. She has type 2 diabetes mellitus. No MedicAlert identification noted. Her disease course has been stable. There are no hypoglycemic associated symptoms. Associated symptoms include blurred vision, fatigue, foot paresthesias and visual change. Pertinent negatives for diabetes include no foot ulcerations. There are no hypoglycemic complications. Symptoms are stable. Diabetic complications include a CVA and peripheral neuropathy. Risk factors for coronary artery disease include diabetes mellitus, dyslipidemia, hypertension, sedentary lifestyle and post-menopausal. Current diabetic treatment includes oral agent (monotherapy). She is compliant with treatment all of the time. Her weight is stable. She is following a generally unhealthy diet. When asked about meal planning, she reported none. She has not had a previous visit with a dietician. She rarely participates in exercise. Her breakfast blood glucose is taken between 8-9 am. Her breakfast blood glucose range is generally 110-130 mg/dl. An ACE inhibitor/angiotensin II receptor blocker is being taken. She does not see a podiatrist.Eye exam is current (Sept 2, 2014).  Hyperlipidemia This is a chronic problem. The current episode started more than 1 year ago. The problem is uncontrolled. Recent lipid tests were reviewed and are high. Exacerbating diseases include diabetes and obesity. Factors aggravating her hyperlipidemia include fatty foods and smoking. Associated symptoms include shortness of breath. She is currently on no antihyperlipidemic treatment. The current treatment provides no improvement of lipids. Compliance problems include adherence to diet.  Risk factors for coronary artery disease include a sedentary lifestyle, post-menopausal,  obesity, hypertension, diabetes mellitus and dyslipidemia.  Peripheral Neuropathy  Patient is on neuropathy and says that her feet are still burning bad- Leg pain & Spasm  Pt taking flexeril-Pt states it helps- Has to walk with a cane in order to keep from falling GERD Patient  Only taking TUMS and zantac- says it is daily- OTC meds not helping Peripheral edema Swelling of bil lower ext has worsened over the last month. Taking lasix daily.     Review of Systems  Constitutional: Positive for fatigue.  Eyes: Positive for blurred vision.  Respiratory: Positive for shortness of breath.   All other systems reviewed and are negative.      Objective:   Physical Exam  Vitals reviewed. Constitutional: She is oriented to person, place, and time. She appears well-developed and well-nourished.  HENT:  Head: Normocephalic.  Right Ear: External ear normal.  Left Ear: External ear normal.  Eyes: Pupils are equal, round, and reactive to light.  Neck: Normal range of motion. Neck supple. No thyromegaly present.  Cardiovascular: Normal rate, regular rhythm, normal heart sounds and intact distal pulses.   Pulmonary/Chest: Effort normal and breath sounds normal. No respiratory distress.  Abdominal: Soft. Bowel sounds are normal. She exhibits no distension. There is no tenderness.  Musculoskeletal: Normal range of motion. She exhibits edema (3+ edema bilaterally in feet).  Neurological: She is alert and oriented to person, place, and time.  Skin: Skin is warm and dry. No rash noted. No erythema. No pallor.  Psychiatric: She has a normal mood and affect. Her behavior is normal. Judgment and thought content normal.     BP 100/62  Pulse 120  Temp(Src) 97.9 F (36.6 C) (Oral)  Ht 5' 2" (1.575 m)  Wt 325 lb 6.4 oz (147.6 kg)  BMI 59.50 kg/m2         Assessment & Plan:   1. Type II or unspecified type diabetes mellitus without mention of complication, not stated as uncontrolled   2.  Thrombocytopenia, unspecified   3. Morbid obesity   4. Insomnia   5. Hyperlipidemia with target LDL less than 100   6. Gastroesophageal reflux disease without esophagitis   7. Generalized anxiety disorder   8. Diabetic autonomic neuropathy associated with diabetes mellitus due to underlying condition   9. Coronary artery disease due to calcified coronary lesion   10. Weight loss counseling, encounter for   11. Peripheral edema    Orders Placed This Encounter  Procedures  . Compression stockings    1 pair- knee high 20-30lbs pressure  . CMP14+EGFR  . NMR, lipoprofile  . POCT glycosylated hemoglobin (Hb A1C)   Elevate legs when sitting Labs pending Health maintenance reviewed Diet and exercise encouraged Continue all meds Follow up  In 3 months   Mary-Margaret , FNP     

## 2014-05-22 NOTE — Addendum Note (Signed)
Addended by: Monica BectonHODGES, Lynnlee Revels F on: 05/22/2014 05:38 PM   Modules accepted: Orders

## 2014-05-23 ENCOUNTER — Telehealth: Payer: Self-pay | Admitting: Nurse Practitioner

## 2014-05-23 ENCOUNTER — Encounter: Payer: Self-pay | Admitting: Neurology

## 2014-05-23 LAB — BMP8+EGFR
BUN/Creatinine Ratio: 17 (ref 9–23)
BUN: 62 mg/dL — ABNORMAL HIGH (ref 6–24)
CO2: 21 mmol/L (ref 18–29)
Calcium: 9.3 mg/dL (ref 8.7–10.2)
Chloride: 90 mmol/L — ABNORMAL LOW (ref 97–108)
Creatinine, Ser: 3.61 mg/dL (ref 0.57–1.00)
GFR calc Af Amer: 16 mL/min/{1.73_m2} — ABNORMAL LOW (ref >59)
GFR calc non Af Amer: 13 mL/min/{1.73_m2} — ABNORMAL LOW (ref >59)
Glucose: 95 mg/dL (ref 65–99)
Potassium: 4.9 mmol/L (ref 3.5–5.2)
Sodium: 127 mmol/L — ABNORMAL LOW (ref 134–144)

## 2014-05-23 LAB — LIPID PANEL WITH LDL/HDL RATIO
Cholesterol, Total: 114 mg/dL (ref 100–199)
HDL: 39 mg/dL — ABNORMAL LOW (ref >39)
LDL Calculated: 52 mg/dL (ref 0–99)
LDl/HDL Ratio: 1.3 ratio units (ref 0.0–3.2)
Triglycerides: 115 mg/dL (ref 0–149)
VLDL Cholesterol Cal: 23 mg/dL (ref 5–40)

## 2014-05-23 LAB — CBC WITH DIFFERENTIAL
Basophils Absolute: 0 10*3/uL (ref 0.0–0.2)
Basos: 0 %
Eos: 5 %
Eosinophils Absolute: 0.3 10*3/uL (ref 0.0–0.4)
HCT: 30.8 % — ABNORMAL LOW (ref 34.0–46.6)
Hemoglobin: 10.3 g/dL — ABNORMAL LOW (ref 11.1–15.9)
Immature Grans (Abs): 0 10*3/uL (ref 0.0–0.1)
Immature Granulocytes: 0 %
Lymphocytes Absolute: 2.5 10*3/uL (ref 0.7–3.1)
Lymphs: 37 %
MCH: 34 pg — ABNORMAL HIGH (ref 26.6–33.0)
MCHC: 33.4 g/dL (ref 31.5–35.7)
MCV: 102 fL — ABNORMAL HIGH (ref 79–97)
Monocytes Absolute: 0.5 10*3/uL (ref 0.1–0.9)
Monocytes: 8 %
Neutrophils Absolute: 3.4 10*3/uL (ref 1.4–7.0)
Neutrophils Relative %: 50 %
Platelets: 159 10*3/uL (ref 150–379)
RBC: 3.03 x10E6/uL — ABNORMAL LOW (ref 3.77–5.28)
RDW: 15.5 % — ABNORMAL HIGH (ref 12.3–15.4)
WBC: 6.7 10*3/uL (ref 3.4–10.8)

## 2014-05-23 LAB — SPECIMEN STATUS REPORT

## 2014-05-23 LAB — IRON AND TIBC
Iron Saturation: 14 % — ABNORMAL LOW (ref 15–55)
Iron: 39 ug/dL (ref 35–155)
TIBC: 270 ug/dL (ref 250–450)
UIBC: 231 ug/dL (ref 150–375)

## 2014-05-23 LAB — PTH, INTACT AND CALCIUM
Calcium: 9.3 mg/dL (ref 8.7–10.2)
PTH: 115 pg/mL — ABNORMAL HIGH (ref 15–65)

## 2014-05-23 LAB — FERRITIN: Ferritin: 66 ng/mL (ref 15–150)

## 2014-05-24 NOTE — Telephone Encounter (Signed)
Patient aware and appt made for 1 month

## 2014-05-24 NOTE — Telephone Encounter (Addendum)
Lets just do strict low carb diet and check her in a month and see if we can get away with no metformin. Continue lasix as rx- elevate legs when sitting and we will check it in 1 month

## 2014-05-28 ENCOUNTER — Encounter: Payer: Self-pay | Admitting: Neurology

## 2014-05-28 ENCOUNTER — Ambulatory Visit (INDEPENDENT_AMBULATORY_CARE_PROVIDER_SITE_OTHER): Payer: Medicaid Other | Admitting: Neurology

## 2014-05-28 VITALS — BP 93/61 | HR 78 | Resp 18

## 2014-05-28 DIAGNOSIS — G4733 Obstructive sleep apnea (adult) (pediatric): Secondary | ICD-10-CM

## 2014-05-28 DIAGNOSIS — R0902 Hypoxemia: Secondary | ICD-10-CM

## 2014-05-28 MED ORDER — BUDESONIDE 32 MCG/ACT NA SUSP: 1.0000 | Freq: Every day | NASAL | Status: DC

## 2014-05-28 MED ORDER — ROPINIROLE HCL 0.5 MG PO TABS: ORAL_TABLET | ORAL | Status: DC

## 2014-05-28 NOTE — Progress Notes (Signed)
[name]  Provider:  Melvyn Novas, [place] D  Referring Provider: Ernestina Penna, MD Primary Care Physician:  Rudi Heap, MD  Chief Complaint  Patient presents with  . Follow-up    Room 11  . Sleep consult    HPI:  [name] is a 53 y.o., right handed caucasian and native [place] female , who is seen here as a referral from Dr. Terrace Arabia  ( via Darrol Angel, NP ) and Dr. Christell Constant for a sleep consultation.  POST SPLIT STUDY VISIT:  [name] is seen here in the presence of her sister, the patient was referred for a sleep study after she had complained of witnessed snoring, shortness of breath and excessive daytime sleepiness. The patient had suffered from coronary artery disease, morbid obesity, diabetes mellitus, gastroesophageal reflux, hyperlipidemia , right sided headaches and hypertension. She was therefore deemed a high risk to have obstructive sleep apnea, hypercapnia and hypoxemia.  A split study was performed on 03-27-14 the patient's BMI was 54.1 the neck circumference 17.5 inches, prestudy blood pressure was excellent 126/74 mm Hg. The patient's apnea was mild AHI was 10.9 RDI was 16.2. During REM sleep her AHI was 78.5 and she slept all night nonsupine.  Lowest oxygen saturation was 70% for a total of 160 minutes.  She was titrated to 6 cm water which did not show any improvement in hypoxemia but had cleared the apnea - the technician  Switched than  to BiPAP which still did require additionally 2 L of oxygen to resolve hypoxemia. The oxygen was supplemented the patient still remained hypoxemic on CPAP and BiPAP.  She also had frequent periodic limb movements at night she slept restless and kept her index for limb movements was 59.3 per hour / once per minute.  The patient reports that she had felt as if air was taken away form her when she initially used CPAP/BiPAP.    Her bed time is around 9 Pm and takes her medicine, she will  watch TV in bed, and sleeps quickly- she sleeps all night with TV in the background. She sleeps for 1-2 hours and will wake up every 1-2 hours and go back to sleep. She watches TV in the wakeful period. There is no established rise time, depending on how she feels. No alarm is set, no outside appointments dictate her to rise.  Nocturia 1 time nightly.  She remains in orthopnea, needs 3-4 pillows. She sleeps alone , her husband sleeps on the couch with their 5 chihuahuas.  She has been unemployed since 2012, when working at [employer]. Her job was standing and she could not tolerate the standing any more.   Her past medical history was in detail entered by her primary neurologist.    Review of Systems: Out of a complete 14 system review, the patient complains of only the following symptoms, and all other reviewed systems are negative. EDS, nocturia, shortness of breath, left eye pain and headaches. Vision loss in the left eye.  Left face numbness.    History   Social History  . Marital Status: Married    Spouse Name: [name]    Number of Children: 5  . Years of Education: 10 th   Occupational History  .      disabled   Social History Main Topics  . Smoking status: Current Every Day Smoker -- 1.00 packs/day for 35 years    Types: Cigarettes  . Smokeless tobacco:  Never Used  . Alcohol Use: No  . Drug Use: No  . Sexual Activity: No   Other Topics Concern  . Not on file   Social History Narrative   Patient lives at home with her husband and grandchild [name]). Patient is disabled.   Patient has 10 th grade education.   Right handed.   Caffeine- one  cup of coffee and  One soda Dr.Pepper/ tea. daily    Family History  Problem Relation Age of Onset  . Coronary artery disease Father   . Cancer - Colon Father   . Breast cancer Mother   . Diabetes Father   . High Cholesterol Father     Past Medical History  Diagnosis Date  . GERD (gastroesophageal reflux  disease)   . Hyperlipidemia   . Noncompliance   . CAD (coronary artery disease)     post PTCA with stent placement  . Morbid obesity   . Diabetes mellitus without complication   . Hypertension   . HA (headache)   . Left-sided face pain     Past Surgical History  Procedure Laterality Date  . Cholecystectomy    . Abdominal hysterectomy    . Ptca      WITH STENT PLACEMENT  . Colonoscopy N/A 04/10/2013    Procedure: COLONOSCOPY;  Surgeon: Malissa Hippo, MD;  Location: [facility];  Service: Endoscopy;  Laterality: N/A;  . Eye surgery      Current Outpatient Prescriptions  Medication Sig Dispense Refill  . aspirin EC 325 MG tablet Take 325 mg by mouth daily.      Marland Kitchen gabapentin (NEURONTIN) 300 MG capsule Take 1 capsule (300 mg total) by mouth 4 (four) times daily.  120 capsule  3  . lisinopril (PRINIVIL,ZESTRIL) 20 MG tablet Take 1 tablet (20 mg total) by mouth daily.  30 tablet  5  . omeprazole (PRILOSEC) 40 MG capsule Take 1 capsule (40 mg total) by mouth daily.  30 capsule  5  . rosuvastatin (CRESTOR) 20 MG tablet Take 20 mg by mouth at bedtime.      . furosemide (LASIX) 80 MG tablet Take 1 tablet (80 mg total) by mouth daily.  30 tablet  3  . hyoscyamine (LEVSIN SL) 0.125 MG SL tablet Place 0.125 mg under the tongue every 4 (four) hours as needed for cramping.      . metFORMIN (GLUCOPHAGE-XR) 500 MG 24 hr tablet Take 1 tablet (500 mg total) by mouth daily with breakfast.  30 tablet  5  . metolazone (ZAROXOLYN) 2.5 MG tablet Take 2.5 mg by mouth every Monday.      . ondansetron (ZOFRAN) 4 MG tablet Take 1 tablet (4 mg total) by mouth every 8 (eight) hours as needed for nausea or vomiting.  10 tablet  0   No current facility-administered medications for this visit.    Allergies as of 05/28/2014 - Review Complete 05/28/2014  Allergen Reaction Noted  . Morphine and related Nausea And Vomiting 01/11/2011    Vitals: BP 93/61  Pulse 78  Resp 18 Last Weight:  Wt Readings from  Last 1 Encounters:  05/22/14 325 lb 6.4 oz (147.6 kg)   Last Height:   Ht Readings from Last 1 Encounters:  05/22/14 5\' 2"  (1.575 m)    Physical exam:  General: The patient is awake, alert and appears not in acute distress. The patient is well groomed. Head: Normocephalic, atraumatic. Neck is supple. Mallampati 3 , neck circumference: 17.75 inches, double chin.  Cardiovascular:  Regular rate and rhythm , without  murmurs or carotid bruit, and without distended neck veins. Respiratory: Lungs are clear to auscultation. Skin:  With  evidence of  Bilateral ankle edema, or rash Trunk: BMI is severely  elevated and patient  has normal posture.  Neurologic exam : The patient is awake and alert, oriented to place and time.  Memory subjective described as intact.  There is a normal attention span & concentration ability. Speech :dysarthria, dysphonia not aphasia. Mood and affect are depressed . Cranial nerves: Marland Kitchen. Extraocular movements  in vertical and horizontal planes intact and without nystagmus. Visual fields by finger perimetry are intact. Left eye vision blurred.  Hearing to finger rub intact.  Facial sensation reduced on the left face , all three trigeminal branches, to fine touch.  Left side ptosis , left pupil dilated. Facial motor strength is symmetric and tongue and uvula move midline.  Motor exam: Normal tone and normal muscle bulk and symmetric normal strength in all extremities.  Sensory:  Fine touch, pinprick and vibration were reduced  over both lower extremities.  Proprioception is impaired.  Coordination: Rapid alternating movements in the fingers/hands is tested and normal.  Finger-to-nose maneuver tested and normal without evidence of ataxia, dysmetria or tremor.  Gait and station: Patient walks with a cane  assistive device. Stance is wide based stable and unstable.    Deep tendon reflexes: in the  upper and lower extremities are symmetric and intact.  Babinski maneuver  response is  downgoing.   Assessment:  After physical and neurologic examination, review of laboratory studies, imaging, neurophysiology testing and pre-existing records, assessment is  The patient still in dorsi Epworth sleepiness score at 14 points and her fatigue severity score at 63 points. Pulse are elevated she states that she has some air hunger feeling suffocated by using her CPAP at the current rub or low-pressure setting by 6 cm water cleared her of apneas it did not correct her hypoxemia. .   Her download today shows an AHI of 13.8 which is much too high on 13 over 9 cm water BipAP. ; I suggest to reduce the pressure BiPAP 10/6 continue with 3 L of oxygen. Since the patient is using a nasal pillow and has rhinitis - I suggest to use a nasal spray to keep the nasal passage on for breathing. In addition she had a lot of periodic limb movements at night and I suggest the medication tonight that helps her not to be quite as restless.    Plan:  Treatment plan and additional workup :  nasonex/ rhinocort  BiPAP 10 over 6 cm water , and 3 liters of oxygen. Requip at 0.25 mg nightly. Poor compliance has to improve , user time 4 hours nightly explained.

## 2014-05-28 NOTE — Patient Instructions (Signed)
Calorie Counting for Weight Loss Calories are energy you get from the things you eat and drink. Your body uses this energy to keep you going throughout the day. The number of calories you eat affects your weight. When you eat more calories than your body needs, your body stores the extra calories as fat. When you eat fewer calories than your body needs, your body burns fat to get the energy it needs. Calorie counting means keeping track of how many calories you eat and drink each day. If you make sure to eat fewer calories than your body needs, you should lose weight. In order for calorie counting to work, you will need to eat the number of calories that are right for you in a day to lose a healthy amount of weight per week. A healthy amount of weight to lose per week is usually 1-2 lb (0.5-0.9 kg). A dietitian can determine how many calories you need in a day and give you suggestions on how to reach your calorie goal.  WHAT IS MY MY PLAN? My goal is to have __________ calories per day.  If I have this many calories per day, I should lose around __________ pounds per week. WHAT DO I NEED TO KNOW ABOUT CALORIE COUNTING? In order to meet your daily calorie goal, you will need to:  Find out how many calories are in each food you would like to eat. Try to do this before you eat.  Decide how much of the food you can eat.  Write down what you ate and how many calories it had. Doing this is called keeping a food log. WHERE DO I FIND CALORIE INFORMATION? The number of calories in a food can be found on a Nutrition Facts label. Note that all the information on a label is based on a specific serving of the food. If a food does not have a Nutrition Facts label, try to look up the calories online or ask your dietitian for help. HOW DO I DECIDE HOW MUCH TO EAT? To decide how much of the food you can eat, you will need to consider both the number of calories in one serving and the size of one serving. This  information can be found on the Nutrition Facts label. If a food does not have a Nutrition Facts label, look up the information online or ask your dietitian for help. Remember that calories are listed per serving. If you choose to have more than one serving of a food, you will have to multiply the calories per serving by the amount of servings you plan to eat. For example, the label on a package of bread might say that a serving size is 1 slice and that there are 90 calories in a serving. If you eat 1 slice, you will have eaten 90 calories. If you eat 2 slices, you will have eaten 180 calories. HOW DO I KEEP A FOOD LOG? After each meal, record the following information in your food log:  What you ate.  How much of it you ate.  How many calories it had.  Then, add up your calories. Keep your food log near you, such as in a small notebook in your pocket. Another option is to use a mobile app or website. Some programs will calculate calories for you and show you how many calories you have left each time you add an item to the log. WHAT ARE SOME CALORIE COUNTING TIPS?  Use your calories on foods   and drinks that will fill you up and not leave you hungry. Some examples of this include foods like nuts and nut butters, vegetables, lean proteins, and high-fiber foods (more than 5 g fiber per serving).  Eat nutritious foods and avoid empty calories. Empty calories are calories you get from foods or beverages that do not have many nutrients, such as candy and soda. It is better to have a nutritious high-calorie food (such as an avocado) than a food with few nutrients (such as a bag of chips).  Know how many calories are in the foods you eat most often. This way, you do not have to look up how many calories they have each time you eat them.  Look out for foods that may seem like low-calorie foods but are really high-calorie foods, such as baked goods, soda, and fat-free candy.  Pay attention to calories  in drinks. Drinks such as sodas, specialty coffee drinks, alcohol, and juices have a lot of calories yet do not fill you up. Choose low-calorie drinks like water and diet drinks.  Focus your calorie counting efforts on higher calorie items. Logging the calories in a garden salad that contains only vegetables is less important than calculating the calories in a milk shake.  Find a way of tracking calories that works for you. Get creative. Most people who are successful find ways to keep track of how much they eat in a day, even if they do not count every calorie. WHAT ARE SOME PORTION CONTROL TIPS?  Know how many calories are in a serving. This will help you know how many servings of a certain food you can have.  Use a measuring cup to measure serving sizes. This is helpful when you start out. With time, you will be able to estimate serving sizes for some foods.  Take some time to put servings of different foods on your favorite plates, bowls, and cups so you know what a serving looks like.  Try not to eat straight from a bag or box. Doing this can lead to overeating. Put the amount you would like to eat in a cup or on a plate to make sure you are eating the right portion.  Use smaller plates, glasses, and bowls to prevent overeating. This is a quick and easy way to practice portion control. If your plate is smaller, less food can fit on it.  Try not to multitask while eating, such as watching TV or using your computer. If it is time to eat, sit down at a table and enjoy your food. Doing this will help you to start recognizing when you are full. It will also make you more aware of what and how much you are eating. HOW CAN I CALORIE COUNT WHEN EATING OUT?  Ask for smaller portion sizes or child-sized portions.  Consider sharing an [place] and sides instead of getting your own [place].  If you get your own [place]dal Pul[place], eat only half. Ask for a box at the beginning of your meal and put the rest of your  entre in it so you are not tempted to eat it.  Look for the calories on the menu. If calories are listed, choose the lower calorie options.  Choose dishes that include vegetables, fruits, whole grains, low-fat dairy products, and lean protein. Focusing on smart food choices from each of the 5 food groups can help you stay on track at restaurants.  Choose items that are boiled, broiled, grilled, or steamed.  Choose  water, milk, unsweetened iced tea, or other drinks without added sugars. If you want an alcoholic beverage, choose a lower calorie option. For example, a regular margarita can have up to 700 calories and a glass of wine has around 150.  Stay away from items that are buttered, battered, fried, or served with cream sauce. Items labeled "crispy" are usually fried, unless stated otherwise.  Ask for dressings, sauces, and syrups on the side. These are usually very high in calories, so do not eat much of them.  Watch out for salads. Many people think salads are a healthy option, but this is often not the case. Many salads come with bacon, fried chicken, lots of cheese, fried chips, and dressing. All of these items have a lot of calories. If you want a salad, choose a garden salad and ask for grilled meats or steak. Ask for the dressing on the side, or ask for olive oil and vinegar or lemon to use as dressing.  Estimate how many servings of a food you are given. For example, a serving of cooked rice is 1/2 cup or about the size of half a tennis ball or one cupcake wrapper. Knowing serving sizes will help you be aware of how much food you are eating at restaurants. The list below tells you how big or small some common portion sizes are based on everyday objects.  1 oz--4 stacked dice.  3 oz--1 deck of cards.  1 tsp--1 dice.  1 Tbsp--1/2 a Ping-Pong ball.  2 Tbsp--1 Ping-Pong ball.  1/2 cup--1 tennis ball or 1 cupcake wrapper.  1 cup--1 baseball. Document Released: 10/25/2005  Document Revised: 10/30/2013 Document Reviewed: 08/30/2013 [facility] Patient Information 2015 [place], [place]. This information is not intended to replace advice given to you by your health care provider. Make sure you discuss any questions you have with your health care provider. Chronic Obstructive Pulmonary Disease Chronic obstructive pulmonary disease (COPD) is a common lung condition in which airflow from the lungs is limited. COPD is a general term that can be used to describe many different lung problems that limit airflow, including both chronic bronchitis and emphysema. If you have COPD, your lung function will probably never return to normal, but there are measures you can take to improve lung function and make yourself feel better.  CAUSES   Smoking (common).   Exposure to secondhand smoke.   Genetic problems.  Chronic inflammatory lung diseases or recurrent infections. SYMPTOMS   Shortness of breath, especially with physical activity.   Deep, persistent (chronic) cough with a large amount of thick mucus.   Wheezing.   Rapid breaths (tachypnea).   Gray or bluish discoloration (cyanosis) of the skin, especially in fingers, toes, or lips.   Fatigue.   Weight loss.   Frequent infections or episodes when breathing symptoms become much worse (exacerbations).   Chest tightness. DIAGNOSIS  Your healthcare provider will take a medical history and perform a physical examination to make the initial diagnosis. Additional tests for COPD may include:   Lung (pulmonary) function tests.  Chest X-ray.  CT scan.  Blood tests. TREATMENT  Treatment available to help you feel better when you have COPD include:   Inhaler and nebulizer medicines. These help manage the symptoms of COPD and make your breathing more comfortable  Supplemental oxygen. Supplemental oxygen is only helpful if you have a low oxygen level in your blood.   Exercise and physical activity. These  are beneficial for nearly all people with COPD. Some  people may also benefit from a pulmonary rehabilitation program. HOME CARE INSTRUCTIONS   Take all medicines (inhaled or pills) as directed by your health care provider.  Only take over-the-counter or prescription medicines for pain, fever, or discomfort as directed by your health care provider.   Avoid over-the-counter medicines or cough syrups that dry up your airway (such as antihistamines) and slow down the elimination of secretions unless instructed otherwise by your healthcare provider.   If you are a smoker, the most important thing that you can do is stop smoking. Continuing to smoke will cause further lung damage and breathing trouble. Ask your health care provider for help with quitting smoking. He or she can direct you to community resources or hospitals that provide support.  Avoid exposure to irritants such as smoke, chemicals, and fumes that aggravate your breathing.  Use oxygen therapy and pulmonary rehabilitation if directed by your health care provider. If you require home oxygen therapy, ask your healthcare provider whether you should purchase a pulse oximeter to measure your oxygen level at home.   Avoid contact with individuals who have a contagious illness.  Avoid extreme temperature and humidity changes.  Eat healthy foods. Eating smaller, more frequent meals and resting before meals may help you maintain your strength.  Stay active, but balance activity with periods of rest. Exercise and physical activity will help you maintain your ability to do things you want to do.  Preventing infection and hospitalization is very important when you have COPD. Make sure to receive all the vaccines your health care provider recommends, especially the pneumococcal and influenza vaccines. Ask your healthcare provider whether you need a pneumonia vaccine.  Learn and use relaxation techniques to manage stress.  Learn and use  controlled breathing techniques as directed by your health care provider. Controlled breathing techniques include:   Pursed lip breathing. Start by breathing in (inhaling) through your nose for 1 second. Then, purse your lips as if you were going to whistle and breathe out (exhale) through the pursed lips for 2 seconds.   Diaphragmatic breathing. Start by putting one hand on your abdomen just above your waist. Inhale slowly through your nose. The hand on your abdomen should move out. Then purse your lips and exhale slowly. You should be able to feel the hand on your abdomen moving in as you exhale.   Learn and use controlled coughing to clear mucus from your lungs. Controlled coughing is a series of short, progressive coughs. The steps of controlled coughing are:  1. Lean your head slightly forward.  2. Breathe in deeply using diaphragmatic breathing.  3. Try to hold your breath for 3 seconds.  4. Keep your mouth slightly open while coughing twice.  5. Spit any mucus out into a tissue.  6. Rest and repeat the steps once or twice as needed. SEEK MEDICAL CARE IF:   You are coughing up more mucus than usual.   There is a change in the color or thickness of your mucus.   Your breathing is more labored than usual.   Your breathing is faster than usual.  SEEK IMMEDIATE MEDICAL CARE IF:   You have shortness of breath while you are resting.   You have shortness of breath that prevents you from:  Being able to talk.   Performing your usual physical activities.   You have chest pain lasting longer than 5 minutes.   Your skin color is more cyanotic than usual.  You measure low oxygen saturations  for longer than 5 minutes with a pulse oximeter. MAKE SURE YOU:   Understand these instructions.  Will watch your condition.  Will get help right away if you are not doing well or get worse. Document Released: 08/04/2005 Document Revised: 08/15/2013 Document Reviewed:  06/21/2013 [facility] Patient Information 2015 [place], [place]. This information is not intended to replace advice given to you by your health care provider. Make sure you discuss any questions you have with your health care provider.

## 2014-05-29 ENCOUNTER — Telehealth: Payer: Self-pay

## 2014-05-29 ENCOUNTER — Telehealth: Payer: Self-pay | Admitting: Nurse Practitioner

## 2014-05-29 MED ORDER — FLUTICASONE PROPIONATE 50 MCG/ACT NA SUSP: 2.0000 | Freq: Every day | NASAL | Status: DC

## 2014-05-29 NOTE — Telephone Encounter (Signed)
[facility] sent us a fax saying Rhinocort is not covered by ins.  I contacted Medicaid, and they indicate the preferred drugs include Astelin, Generic Flonase and Generic Atrovent.  Would you like to change to a formulary alternative?  Please advise.  Thank you.

## 2014-05-29 NOTE — Telephone Encounter (Signed)
Talked to pt and explained that we do not have a form, she will call advanced and request another

## 2014-05-29 NOTE — Telephone Encounter (Signed)
Generic nasocort

## 2014-06-12 ENCOUNTER — Encounter (HOSPITAL_BASED_OUTPATIENT_CLINIC_OR_DEPARTMENT_OTHER): Payer: Medicaid Other | Attending: General Surgery

## 2014-06-12 DIAGNOSIS — L02219 Cutaneous abscess of trunk, unspecified: Secondary | ICD-10-CM | POA: Insufficient documentation

## 2014-06-12 DIAGNOSIS — L03319 Cellulitis of trunk, unspecified: Secondary | ICD-10-CM | POA: Diagnosis present

## 2014-06-20 ENCOUNTER — Ambulatory Visit (INDEPENDENT_AMBULATORY_CARE_PROVIDER_SITE_OTHER): Payer: Medicaid Other | Admitting: Nurse Practitioner

## 2014-06-20 ENCOUNTER — Encounter: Payer: Self-pay | Admitting: Nurse Practitioner

## 2014-06-20 VITALS — BP 115/65 | HR 87 | Temp 97.3°F | Ht 62.0 in | Wt 320.6 lb

## 2014-06-20 DIAGNOSIS — R609 Edema, unspecified: Secondary | ICD-10-CM

## 2014-06-20 DIAGNOSIS — R600 Localized edema: Secondary | ICD-10-CM

## 2014-06-20 DIAGNOSIS — E119 Type 2 diabetes mellitus without complications: Secondary | ICD-10-CM

## 2014-06-20 DIAGNOSIS — N179 Acute kidney failure, unspecified: Secondary | ICD-10-CM

## 2014-06-20 DIAGNOSIS — R0789 Other chest pain: Secondary | ICD-10-CM

## 2014-06-20 LAB — POCT GLYCOSYLATED HEMOGLOBIN (HGB A1C): Hemoglobin A1C: 5.7

## 2014-06-20 NOTE — Patient Instructions (Signed)

## 2014-06-20 NOTE — Progress Notes (Addendum)
Subjective:    Patient ID: [name], female    DOB: 04/23/1961, 53 y.o.   MRN: [id number]  Patient had blood work done 1 month ago and was informed by the nephrologist to stop taking her Metformin, decrease dosage on Lasix 80mg  to 40mg  qd, and to stop her pain medication.    Diabetes She presents for her follow-up diabetic visit. She has type 2 diabetes mellitus. No MedicAlert identification noted. Her disease course has been stable. There are no hypoglycemic associated symptoms. Associated symptoms include blurred vision, fatigue, foot paresthesias and visual change. Pertinent negatives for diabetes include no foot ulcerations. There are no hypoglycemic complications. Symptoms are stable. Diabetic complications include a CVA and peripheral neuropathy. Risk factors for coronary artery disease include diabetes mellitus, dyslipidemia, hypertension, sedentary lifestyle and post-menopausal. Current diabetic treatment includes oral agent (monotherapy). She is compliant with treatment all of the time. Her weight is stable. She is following a generally unhealthy diet. When asked about meal planning, she reported none. She has not had a previous visit with a dietician. She rarely participates in exercise. Her breakfast blood glucose is taken between 8-9 am. Her breakfast blood glucose range is generally 110-130 mg/dl. An ACE inhibitor/angiotensin II receptor blocker is being taken. She does not see a podiatrist.Eye exam is current (Sept 2, 2014).  Hyperlipidemia This is a chronic problem. The current episode started more than 1 year ago. The problem is uncontrolled. Recent lipid tests were reviewed and are high. Exacerbating diseases include diabetes and obesity. Factors aggravating her hyperlipidemia include fatty foods and smoking. Associated symptoms include shortness of breath. She is currently on no antihyperlipidemic treatment. The current treatment provides no improvement of lipids. Compliance problems  include adherence to diet.  Risk factors for coronary artery disease include a sedentary lifestyle, post-menopausal, obesity, hypertension, diabetes mellitus and dyslipidemia.  Peripheral Neuropathy  Patient is on neuropathy and says that her feet burn at times. Leg pain & Spasm  Pt taking flexeril-Pt states it helps- Has to walk with a cane in order to keep from falling GERD Patient  Only taking TUMS and zantac- says it is daily- OTC meds not helping Peripheral edema Swelling of bil lower ext has worsened over the last month.  Lasix has been decreased from 80mg  qd to 40mg       Review of Systems  Constitutional: Positive for appetite change and fatigue.  Eyes: Positive for blurred vision.  Respiratory: Positive for chest tightness and shortness of breath.   Cardiovascular: Positive for leg swelling.  Gastrointestinal: Positive for nausea.  Skin: Negative.   All other systems reviewed and are negative.      Objective:   Physical Exam  Vitals reviewed. Constitutional: She is oriented to person, place, and time. She appears well-developed and well-nourished.  Eyes: EOM are normal. Pupils are equal, round, and reactive to light.  Cardiovascular: Normal rate, regular rhythm, normal heart sounds and intact distal pulses.   Pulmonary/Chest: Effort normal and breath sounds normal. No respiratory distress.  Musculoskeletal: Normal range of motion. She exhibits edema (3+ edema bilaterally in feet).  Neurological: She is alert and oriented to person, place, and time.  Skin: Skin is warm and dry.  Psychiatric: She has a normal mood and affect. Her behavior is normal. Judgment and thought content normal.     BP 115/65  Pulse 87  Temp(Src) 97.3 F (36.3 C) (Oral)  Ht 5\' 2"  (1.575 m)  Wt 320 lb 9.6 oz (145.423 kg)  BMI 58.62 kg/m2  Results for orders placed in visit on 06/20/14  POCT GLYCOSYLATED HEMOGLOBIN (HGB A1C)      Result Value Ref Range   Hemoglobin A1C 5.7%     EKG- NSR  with Guadlupe Spanish, FNP      Assessment & Plan:   1. Type II or unspecified type diabetes mellitus without mention of complication, not stated as uncontrolled   2. Other chest pain   3. Bilateral edema of lower extremity   4. Acute renal failure, unspecified acute renal failure type    Orders Placed This Encounter  Procedures  . POCT glycosylated hemoglobin (Hb A1C)  . EKG 12-Lead    Okay to not be on metformin- but watch carbs in diet Need to see nephrologist ASAP- need to know what they will let you take for edema Elevate legs when sitting RTO prn  Mary-Margaret Daphine Deutscher, FNP

## 2014-06-20 NOTE — Addendum Note (Signed)
Addended by: Bennie PieriniMARTIN, MARY-MARGARET on: 06/20/2014 12:13 PM   Modules accepted: Orders

## 2014-06-21 ENCOUNTER — Telehealth: Payer: Self-pay | Admitting: Family Medicine

## 2014-06-21 LAB — BMP8+EGFR
BUN/Creatinine Ratio: 7 — ABNORMAL LOW (ref 9–23)
BUN: 11 mg/dL (ref 6–24)
CO2: 22 mmol/L (ref 18–29)
Calcium: 9 mg/dL (ref 8.7–10.2)
Chloride: 102 mmol/L (ref 97–108)
Creatinine, Ser: 1.49 mg/dL — ABNORMAL HIGH (ref 0.57–1.00)
GFR calc Af Amer: 45 mL/min/{1.73_m2} — ABNORMAL LOW (ref >59)
GFR calc non Af Amer: 39 mL/min/{1.73_m2} — ABNORMAL LOW (ref >59)
Glucose: 95 mg/dL (ref 65–99)
Potassium: 3.9 mmol/L (ref 3.5–5.2)
Sodium: 143 mmol/L (ref 134–144)

## 2014-06-21 NOTE — Telephone Encounter (Signed)
Message copied by Azalee CourseFULP, ASHLEY on Fri Jun 21, 2014 10:53 AM ------      Message from: Bennie PieriniMARTIN, MARY-MARGARET      Created: Fri Jun 21, 2014  8:39 AM       Hgba1c discussed at appointment      Kidney and liver function stable      Creatine has improved- will send results to nephrologist- please find out who she seees ------

## 2014-08-12 ENCOUNTER — Telehealth: Payer: Self-pay | Admitting: *Deleted

## 2014-08-12 NOTE — Telephone Encounter (Signed)
Spoke with patient to r/s her appointment on 10/12 with NP CM, appointment was changed to 10 am with NP MM. NP CM admin time.

## 2014-08-19 ENCOUNTER — Ambulatory Visit (INDEPENDENT_AMBULATORY_CARE_PROVIDER_SITE_OTHER): Payer: Medicaid Other | Admitting: Adult Health

## 2014-08-19 ENCOUNTER — Encounter: Payer: Self-pay | Admitting: Adult Health

## 2014-08-19 ENCOUNTER — Encounter (INDEPENDENT_AMBULATORY_CARE_PROVIDER_SITE_OTHER): Payer: Self-pay

## 2014-08-19 VITALS — BP 142/69 | HR 76 | Ht 65.0 in | Wt 308.0 lb

## 2014-08-19 DIAGNOSIS — G4730 Sleep apnea, unspecified: Secondary | ICD-10-CM

## 2014-08-19 DIAGNOSIS — R208 Other disturbances of skin sensation: Secondary | ICD-10-CM

## 2014-08-19 DIAGNOSIS — R200 Anesthesia of skin: Secondary | ICD-10-CM

## 2014-08-19 NOTE — Patient Instructions (Signed)
Sleep Apnea  Sleep apnea is a sleep disorder characterized by abnormal pauses in breathing while you sleep. When your breathing pauses, the level of oxygen in your blood decreases. This causes you to move out of deep sleep and into light sleep. As a result, your quality of sleep is poor, and the system that carries your blood throughout your body (cardiovascular system) experiences stress. If sleep apnea remains untreated, the following conditions can develop:  High blood pressure (hypertension).  Coronary artery disease.  Inability to achieve or maintain an erection (impotence).  Impairment of your thought process (cognitive dysfunction). There are three types of sleep apnea: 1. Obstructive sleep apnea--Pauses in breathing during sleep because of a blocked airway. 2. Central sleep apnea--Pauses in breathing during sleep because the area of the brain that controls your breathing does not send the correct signals to the muscles that control breathing. 3. Mixed sleep apnea--A combination of both obstructive and central sleep apnea. RISK FACTORS The following risk factors can increase your risk of developing sleep apnea:  Being overweight.  Smoking.  Having narrow passages in your nose and throat.  Being of older age.  Being female.  Alcohol use.  Sedative and tranquilizer use.  Ethnicity. Among individuals younger than 35 years, African Americans are at increased risk of sleep apnea. SYMPTOMS   Difficulty staying asleep.  Daytime sleepiness and fatigue.  Loss of energy.  Irritability.  Loud, heavy snoring.  Morning headaches.  Trouble concentrating.  Forgetfulness.  Decreased interest in sex. DIAGNOSIS  In order to diagnose sleep apnea, your caregiver will perform a physical examination. Your caregiver may suggest that you take a home sleep test. Your caregiver may also recommend that you spend the night in a sleep lab. In the sleep lab, several monitors record  information about your heart, lungs, and brain while you sleep. Your leg and arm movements and blood oxygen level are also recorded. TREATMENT The following actions may help to resolve mild sleep apnea:  Sleeping on your side.   Using a decongestant if you have nasal congestion.   Avoiding the use of depressants, including alcohol, sedatives, and narcotics.   Losing weight and modifying your diet if you are overweight. There also are devices and treatments to help open your airway:  Oral appliances. These are custom-made mouthpieces that shift your lower jaw forward and slightly open your bite. This opens your airway.  Devices that create positive airway pressure. This positive pressure "splints" your airway open to help you breathe better during sleep. The following devices create positive airway pressure:  Continuous positive airway pressure (CPAP) device. The CPAP device creates a continuous level of air pressure with an air pump. The air is delivered to your airway through a mask while you sleep. This continuous pressure keeps your airway open.  Nasal expiratory positive airway pressure (EPAP) device. The EPAP device creates positive air pressure as you exhale. The device consists of single-use valves, which are inserted into each nostril and held in place by adhesive. The valves create very little resistance when you inhale but create much more resistance when you exhale. That increased resistance creates the positive airway pressure. This positive pressure while you exhale keeps your airway open, making it easier to breath when you inhale again.  Bilevel positive airway pressure (BPAP) device. The BPAP device is used mainly in patients with central sleep apnea. This device is similar to the CPAP device because it also uses an air pump to deliver continuous air pressure   through a mask. However, with the BPAP machine, the pressure is set at two different levels. The pressure when you  exhale is lower than the pressure when you inhale.  Surgery. Typically, surgery is only done if you cannot comply with less invasive treatments or if the less invasive treatments do not improve your condition. Surgery involves removing excess tissue in your airway to create a wider passage way. Document Released: 10/15/2002 Document Revised: 02/19/2013 Document Reviewed: 03/02/2012 [employer] Patient Information 2015 [employer]. This information is not intended to replace advice given to you by your health care provider. Make sure you discuss any questions you have with your health care provider.  

## 2014-08-19 NOTE — Progress Notes (Signed)
PATIENT: [name] DOB: 04/02/1961  REASON FOR VISIT: follow up HISTORY FROM: patient  HISTORY OF PRESENT ILLNESS: [name] is a 53 year old female with a history of OSA on CPAP and left sided numbness. She returns today for follow-up. Regarding the Left sided numbness, acute infarct has been ruled out. The patient had carotid dopplers and 2-D echo that was normal. She reports that the numbness has remained the same. She states that it is only on the face. She has also been having sharp pain in the temporal area bilaterally that extends across the forehead. This is intermittent and usually results in a headache but it does resolve. She will occasionally wake up with a dull morning headache. She states that her left eye will sometimes have "yellow discharge." she notices more when she wakes up from sleeping. Her PCP gave her gabapentin but she has not noticed any benefit with this medication. She also was diagnosed with OSA and was placed on Bipap- her settings were recently changed. At the last visit her compliance was not where it needed to be. She states that she is using her bipap but she feels like she is "smothering." She also uses oxygen and states that she would rather just use the oxygen rather than the CPAP. She also complains that she has mid epigastric pain only when she lays down at night. It is relieved when she sits up.   HISTORY 08/08/13 (YY): [name] is a 53 years old right-handed Caucasian female, referred by her primary care physician Dr. Paulene FloorMary Martin is, and optometrist Dr. Despina AriasYen Le for evaluation of left-sided numbness  About a month ago, she noticed sudden onset left facial numbness, also noticed numbness involving her left arm, leg, body, she denies significant weakness, she was recently diagnosed with diabetes in November 2013, she also complains of bilateral feet paresthesia, burning sensation, she has gait difficulty because of her feet pain, joints pain  MRI brain in Sep 26th  2014: showed no evidence for acute infarction, Asymmetric left much greater than right middle ear and mastoid fluid is observed    REVIEW OF SYSTEMS: Full 14 system review of systems performed and notable only for:  Constitutional: Appetite change, activity change, chills, fatigue Eyes: Eye discharge, eye itching, double vision, blurred vision Ear/Nose/Throat: Hearing pain, ringing in the ears  Skin: Moles  Cardiovascular: Chest pain, leg swelling Respiratory: Cough, wheezing, shortness of breath, chest tightness Gastrointestinal: Diarrhea  Genitourinary: Painful urination, frequency of urination Hematology/Lymphatic: Bruise/bleed easily Endocrine: Cold intolerance, excessive thirst Musculoskeletal: Joint pain, back pain, aching muscles, muscle cramps, walking difficulty, neck pain  Allergy/Immunology: N/A  Neurological: Memory loss, numbness, weakness Psychiatric: Depression, nervous/anxious Sleep: Daytime sleepiness, snoring   ALLERGIES: Allergies  Allergen Reactions  . Morphine And Related Nausea And Vomiting    HOME MEDICATIONS: Outpatient Prescriptions Prior to Visit  Medication Sig Dispense Refill  . aspirin EC 325 MG tablet Take 325 mg by mouth daily.      . budesonide (RHINOCORT AQUA) 32 MCG/ACT nasal spray Place 1 spray into both nostrils daily.  1 Bottle  5  . fluticasone (FLONASE ALLERGY RELIEF) 50 MCG/ACT nasal spray Place 2 sprays into both nostrils daily.  2 g  2  . furosemide (LASIX) 40 MG tablet Take 40 mg by mouth 2 (two) times daily. Provider informed to take 1/2  twice daily      . gabapentin (NEURONTIN) 300 MG capsule Take 1 capsule (300 mg total) by mouth 4 (four)  times daily.  120 capsule  3  . hyoscyamine (LEVSIN SL) 0.125 MG SL tablet Place 0.125 mg under the tongue every 4 (four) hours as needed for cramping.      Marland Kitchen lisinopril (PRINIVIL,ZESTRIL) 20 MG tablet Take 1 tablet (20 mg total) by mouth daily.  30 tablet  5  . omeprazole (PRILOSEC) 40 MG capsule  Take 1 capsule (40 mg total) by mouth daily.  30 capsule  5  . rosuvastatin (CRESTOR) 20 MG tablet Take 20 mg by mouth at bedtime.       No facility-administered medications prior to visit.    PAST MEDICAL HISTORY: Past Medical History  Diagnosis Date  . GERD (gastroesophageal reflux disease)   . Hyperlipidemia   . Noncompliance   . CAD (coronary artery disease)     post PTCA with stent placement  . Morbid obesity   . Diabetes mellitus without complication   . Hypertension   . HA (headache)   . Left-sided face pain     PAST SURGICAL HISTORY: Past Surgical History  Procedure Laterality Date  . Cholecystectomy    . Abdominal hysterectomy    . Ptca      WITH STENT PLACEMENT  . Colonoscopy N/A 04/10/2013    Procedure: COLONOSCOPY;  Surgeon: Malissa Hippo, MD;  Location: [facility];  Service: Endoscopy;  Laterality: N/A;  . Eye surgery      FAMILY HISTORY: Family History  Problem Relation Age of Onset  . Coronary artery disease Father   . Cancer - Colon Father   . Breast cancer Mother   . Diabetes Father   . High Cholesterol Father     SOCIAL HISTORY: History   Social History  . Marital Status: Married    Spouse Name: [name]    Number of Children: 5  . Years of Education: 10 th   Occupational History  .      disabled   Social History Main Topics  . Smoking status: Current Every Day Smoker -- 1.00 packs/day for 35 years    Types: Cigarettes  . Smokeless tobacco: Never Used  . Alcohol Use: No  . Drug Use: No  . Sexual Activity: No   Other Topics Concern  . Not on file   Social History Narrative   Patient lives at home with her husband and grandchild [name]). Patient is disabled.   Patient has 10 th grade education.   Right handed.   Caffeine- one  cup of coffee and  One soda Dr.Pepper/ tea. daily      PHYSICAL EXAM  Filed Vitals:   08/19/14 1001  BP: 142/69  Pulse: 76  Height: 5\' 5"  (1.651 m)  Weight: 308 lb (139.708 kg)   Body mass  index is 51.25 kg/(m^2).  Generalized: Well developed, in no acute distress, obese Skin : pitting edema 2-3+ in lower extremities.   Neurological examination  Mentation: Alert oriented to time, place, history taking. Follows all commands speech and language fluent Cranial nerve II-XII: Pupils were equal round reactive to light. Extraocular movements were full, visual field were full on confrontational test. Facial sensation and strength were normal.  Uvula tongue midline. Head turning and shoulder shrug  were normal and symmetric. Motor: The motor testing reveals 5 over 5 strength of all 4 extremities. Good symmetric motor tone is noted throughout.  Sensory: Sensory testing is intact to soft touch on all 4 extremities except decreased on the left side of face and left arm. Pinprick decrease on  left side of face and left arm.   Coordination: Cerebellar testing reveals good finger-nose-finger and heel-to-shin bilaterally.  Gait and station: Walks with a cane, gait is slightly unsteady contribute to leg swelling and knee pain.  Reflexes: Deep tendon reflexes are symmetric and normal bilaterally.    DIAGNOSTIC DATA (LABS, IMAGING, TESTING) - I reviewed patient records, labs, notes, testing and imaging myself where available.  Lab Results  Component Value Date   WBC 6.7 05/22/2014   HGB 10.3* 05/22/2014   HCT 30.8* 05/22/2014   MCV 102* 05/22/2014   PLT 159 05/22/2014      Component Value Date/Time   NA 143 06/20/2014 1214   NA 140 04/30/2014 1530   K 3.9 06/20/2014 1214   CL 102 06/20/2014 1214   CO2 22 06/20/2014 1214   GLUCOSE 95 06/20/2014 1214   GLUCOSE 116* 04/30/2014 1530   BUN 11 06/20/2014 1214   BUN 49* 04/30/2014 1530   CREATININE 1.49* 06/20/2014 1214   CALCIUM 9.0 06/20/2014 1214   PROT 7.0 04/30/2014 1530   PROT 6.6 02/18/2014 1042   ALBUMIN 3.9 04/30/2014 1530   AST 13 04/30/2014 1530   ALT 11 04/30/2014 1530   ALKPHOS 69 04/30/2014 1530   BILITOT <0.2* 04/30/2014 1530   GFRNONAA 39*  06/20/2014 1214   GFRAA 45* 06/20/2014 1214   Lab Results  Component Value Date   CHOL 123 02/18/2014   HDL 39* 05/22/2014   LDLCALC 52 05/22/2014   TRIG 115 05/22/2014   CHOLHDL 4.5 04/09/2013   Lab Results  Component Value Date   HGBA1C 5.7% 06/20/2014   Lab Results  Component Value Date   VITAMINB12 286 04/30/2014   Lab Results  Component Value Date   TSH 8.526* 04/09/2013      ASSESSMENT AND PLAN 53 y.o. year old female  has a past medical history of GERD (gastroesophageal reflux disease); Hyperlipidemia; Noncompliance; CAD (coronary artery disease); Morbid obesity; Diabetes mellitus without complication; Hypertension; HA (headache); and Left-sided face pain. here with:  1. Left sided facial numbness  Numbness on the left side of the face has remained the same. She does have headaches intermittently that will start as stabbing pain the temporal area bilaterally and extend across the forehead. She will occasionally wake up with a dull headache. I have reviewed stroke prevention with the patient. She is to continue the aspirin daily. I have encouraged the patient to engage in a healthy eating plan to help her lose weight. Patient uses the BiPAP but states that she feels like it is "smothering" her. At this time she is going to have [facility] come out and look at her machine and mask. If [facility] do not offer any solutions to help make the bipap more comfortable for her then she is to call our sleep lab so they can evaluate her mask and settings. She also complained of chest pain during the exam that occurs at night when she lays down- this could be due to acid reflux but I have encouraged her to let her PCP know about these symptoms so they can evaluate. She will follow-up in 4 months or sooner if needed.    Butch PennyMegan Luay Balding, MSN, NP-C 08/19/2014, 10:21 AM [name] [address], [address] [employer] [place] [place], [place] [address] [phone]  Note: This document was prepared with digital  dictation and possible smart phrase technology. Any transcriptional errors that result from this process are unintentional.

## 2014-08-28 ENCOUNTER — Ambulatory Visit (INDEPENDENT_AMBULATORY_CARE_PROVIDER_SITE_OTHER): Payer: Medicaid Other | Admitting: Nurse Practitioner

## 2014-08-28 ENCOUNTER — Encounter: Payer: Self-pay | Admitting: Nurse Practitioner

## 2014-08-28 VITALS — BP 141/80 | HR 85 | Temp 96.9°F | Ht 65.0 in | Wt 314.2 lb

## 2014-08-28 DIAGNOSIS — E0843 Diabetes mellitus due to underlying condition with diabetic autonomic (poly)neuropathy: Secondary | ICD-10-CM

## 2014-08-28 DIAGNOSIS — Z23 Encounter for immunization: Secondary | ICD-10-CM

## 2014-08-28 DIAGNOSIS — K219 Gastro-esophageal reflux disease without esophagitis: Secondary | ICD-10-CM

## 2014-08-28 DIAGNOSIS — I10 Essential (primary) hypertension: Secondary | ICD-10-CM

## 2014-08-28 DIAGNOSIS — E1310 Other specified diabetes mellitus with ketoacidosis without coma: Secondary | ICD-10-CM

## 2014-08-28 DIAGNOSIS — R609 Edema, unspecified: Secondary | ICD-10-CM | POA: Insufficient documentation

## 2014-08-28 DIAGNOSIS — E11618 Type 2 diabetes mellitus with other diabetic arthropathy: Secondary | ICD-10-CM

## 2014-08-28 DIAGNOSIS — F411 Generalized anxiety disorder: Secondary | ICD-10-CM

## 2014-08-28 DIAGNOSIS — R079 Chest pain, unspecified: Secondary | ICD-10-CM

## 2014-08-28 DIAGNOSIS — E1110 Type 2 diabetes mellitus with ketoacidosis without coma: Secondary | ICD-10-CM

## 2014-08-28 DIAGNOSIS — Z1231 Encounter for screening mammogram for malignant neoplasm of breast: Secondary | ICD-10-CM

## 2014-08-28 DIAGNOSIS — G4700 Insomnia, unspecified: Secondary | ICD-10-CM

## 2014-08-28 DIAGNOSIS — R600 Localized edema: Secondary | ICD-10-CM | POA: Insufficient documentation

## 2014-08-28 DIAGNOSIS — E785 Hyperlipidemia, unspecified: Secondary | ICD-10-CM

## 2014-08-28 LAB — POCT GLYCOSYLATED HEMOGLOBIN (HGB A1C): Hemoglobin A1C: 5.5

## 2014-08-28 LAB — POCT UA - MICROALBUMIN: Microalbumin Ur, POC: NEGATIVE mg/L

## 2014-08-28 MED ORDER — LISINOPRIL 20 MG PO TABS: 20.0000 mg | ORAL_TABLET | Freq: Every day | ORAL | Status: DC

## 2014-08-28 MED ORDER — TRAMADOL HCL 50 MG PO TABS: 50.0000 mg | ORAL_TABLET | Freq: Two times a day (BID) | ORAL | Status: DC | PRN

## 2014-08-28 MED ORDER — FUROSEMIDE 80 MG PO TABS: 80.0000 mg | ORAL_TABLET | Freq: Two times a day (BID) | ORAL | Status: DC

## 2014-08-28 NOTE — Progress Notes (Signed)
Subjective:    Patient ID: [name], female    DOB: 1960-12-18, 53 y.o.   MRN: [id number]  Patient here today for follow up of chronic medical problems. Patient c/o chest pain when she lays down at night- describes as a sharp pain- eases with sitting up.   Diabetes She presents for her follow-up diabetic visit. She has type 2 diabetes mellitus. No MedicAlert identification noted. Her disease course has been stable. There are no hypoglycemic associated symptoms. Associated symptoms include blurred vision (left eye. ), fatigue, foot paresthesias and visual change. Pertinent negatives for diabetes include no foot ulcerations. There are no hypoglycemic complications. Symptoms are stable. Diabetic complications include a CVA and peripheral neuropathy. Risk factors for coronary artery disease include diabetes mellitus, dyslipidemia, hypertension, sedentary lifestyle and post-menopausal. Current diabetic treatments: nephrologist stopped metformin due to kidney function but recently said she could start baclk on it.  She is compliant with treatment all of the time. Her weight is stable. She is following a generally unhealthy diet. When asked about meal planning, she reported none. She has not had a previous visit with a dietician (patient trying to avoid all sweets.). She rarely participates in exercise. Her breakfast blood glucose is taken between 8-9 am. Her breakfast blood glucose range is generally 110-130 mg/dl. (No change in blood sugars since stopping metformin 3 months ago.) An ACE inhibitor/angiotensin II receptor blocker is being taken. She does not see a podiatrist.Eye exam is current (Sept 2, 2014).  Hyperlipidemia This is a chronic problem. The current episode started more than 1 year ago. The problem is uncontrolled. Recent lipid tests were reviewed and are high. Exacerbating diseases include diabetes and obesity. Factors aggravating her hyperlipidemia include fatty foods and smoking. Associated  symptoms include shortness of breath. She is currently on no antihyperlipidemic treatment. The current treatment provides no improvement of lipids. Compliance problems include adherence to diet.  Risk factors for coronary artery disease include a sedentary lifestyle, post-menopausal, obesity, hypertension, diabetes mellitus and dyslipidemia.  Peripheral Neuropathy  Patient is on neuropathy and says that her feet are still burning bad- Leg pain & Spasm /arthritis Pt taking flexeril-Pt states it helps- Has to walk with a cane in order to keep from falling. Wants something for pain GERD Patient  Only taking TUMS and zantac- says it is daily- OTC meds not helping Peripheral edema Swelling of bil lower ext has worsened over the last month. Taking lasix daily.     Review of Systems  Constitutional: Positive for fatigue.  Eyes: Positive for blurred vision (left eye. ).  Respiratory: Positive for shortness of breath.   All other systems reviewed and are negative.      Objective:   Physical Exam  Vitals reviewed. Constitutional: She is oriented to person, place, and time. She appears well-developed and well-nourished.  HENT:  Head: Normocephalic.  Right Ear: External ear normal.  Left Ear: External ear normal.  Eyes: Pupils are equal, round, and reactive to light.  Neck: Normal range of motion. Neck supple. No thyromegaly present.  Cardiovascular: Normal rate, regular rhythm, normal heart sounds and intact distal pulses.   Pulmonary/Chest: Effort normal and breath sounds normal. No respiratory distress.  Abdominal: Soft. Bowel sounds are normal. She exhibits no distension. There is no tenderness.  Musculoskeletal: Normal range of motion. She exhibits edema (3+ edema bilaterally in feet).  Neurological: She is alert and oriented to person, place, and time.  Skin: Skin is warm and dry. No rash noted. No erythema.  No pallor.  Psychiatric: She has a normal mood and affect. Her behavior is  normal. Judgment and thought content normal.     BP 141/80  Pulse 85  Temp(Src) 96.9 F (36.1 C) (Oral)  Ht _0  (1.651 m)  Wt 314 lb 3.2 oz (142.52 kg)  BMI 52.29 kg/m2  Results for orders placed in visit on 08/28/14  POCT GLYCOSYLATED HEMOGLOBIN (HGB A1C)      Result Value Ref Range   Hemoglobin A1C 5.5      Adella Nissen, FNP      Assessment & Plan:   1. DM (diabetes mellitus) type 2, uncontrolled, with ketoacidosis Carb counting Going to continue to hold metformin for now - POCT glycosylated hemoglobin (Hb A1C) - POCT UA - Microalbumin  2. Hyperlipidemia with target LDL less than 100 Low fat diet - CMP14+EGFR - NMR, lipoprofile  3. Morbid obesity Discussed diet and exercise for person with BMI >25 Will recheck weight in 3-6 months   4. Bilateral edema of lower extremity Elevate legs when sitting   5. Insomnia bedtime ritual   6. Gastroesophageal reflux disease without esophagitis avooid spicy foods  7. Generalized anxiety disorder Stress management  8. Diabetic autonomic neuropathy associated with diabetes mellitus due to underlying condition  9. Arthritis associated with diabetes - referral to ortho Ultram 33m 1 po BID #60 1 refill  10. Chest pain, unspecified chest pain type Not heart related- may be related to GERD - EKG 12-Lead  11. Essential hypertension, benign Avoid Na+ in diet - lisinopril (PRINIVIL,ZESTRIL) 20 MG tablet; Take 1 tablet (20 mg total) by mouth daily.  Dispense: 30 tablet; Refill: 5  12. Peripheral edema Elevate legs when sitting - furosemide (LASIX) 80 MG tablet; Take 1 tablet (80 mg total) by mouth 2 (two) times daily.  Dispense: 60 tablet; Refill: 3- increased lasix to 821mBID    Labs pending Health maintenance reviewed Diet and exercise encouraged Continue all meds Follow up  In 3 months   MaGardinerFNP

## 2014-08-28 NOTE — Patient Instructions (Signed)
Diabetes and Exercise Exercising regularly is important. It is not just about losing weight. It has many health benefits, such as:  Improving your overall fitness, flexibility, and endurance.  Increasing your bone density.  Helping with weight control.  Decreasing your body fat.  Increasing your muscle strength.  Reducing stress and tension.  Improving your overall health. People with diabetes who exercise gain additional benefits because exercise:  Reduces appetite.  Improves the body's use of blood sugar (glucose).  Helps lower or control blood glucose.  Decreases blood pressure.  Helps control blood lipids (such as cholesterol and triglycerides).  Improves the body's use of the hormone insulin by:  Increasing the body's insulin sensitivity.  Reducing the body's insulin needs.  Decreases the risk for heart disease because exercising:  Lowers cholesterol and triglycerides levels.  Increases the levels of good cholesterol (such as high-density lipoproteins [HDL]) in the body.  Lowers blood glucose levels. YOUR ACTIVITY PLAN  Choose an activity that you enjoy and set realistic goals. Your health care provider or diabetes educator can help you make an activity plan that works for you. Exercise regularly as directed by your health care provider. This includes:  Performing resistance training twice a week such as push-ups, sit-ups, lifting weights, or using resistance bands.  Performing 150 minutes of cardio exercises each week such as walking, running, or playing sports.  Staying active and spending no more than 90 minutes at one time being inactive. Even short bursts of exercise are good for you. Three 10-minute sessions spread throughout the day are just as beneficial as a single 30-minute session. Some exercise ideas include:  Taking the dog for a walk.  Taking the stairs instead of the elevator.  Dancing to your favorite song.  Doing an exercise  video.  Doing your favorite exercise with a friend. RECOMMENDATIONS FOR EXERCISING WITH TYPE 1 OR TYPE 2 DIABETES   Check your blood glucose before exercising. If blood glucose levels are greater than 240 mg/dL, check for urine ketones. Do not exercise if ketones are present.  Avoid injecting insulin into areas of the body that are going to be exercised. For example, avoid injecting insulin into:  The arms when playing tennis.  The legs when jogging.  Keep a record of:  Food intake before and after you exercise.  Expected peak times of insulin action.  Blood glucose levels before and after you exercise.  The type and amount of exercise you have done.  Review your records with your health care provider. Your health care provider will help you to develop guidelines for adjusting food intake and insulin amounts before and after exercising.  If you take insulin or oral hypoglycemic agents, watch for signs and symptoms of hypoglycemia. They include:  Dizziness.  Shaking.  Sweating.  Chills.  Confusion.  Drink plenty of water while you exercise to prevent dehydration or heat stroke. Body water is lost during exercise and must be replaced.  Talk to your health care provider before starting an exercise program to make sure it is safe for you. Remember, almost any type of activity is better than none. Document Released: 01/15/2004 Document Revised: 03/11/2014 Document Reviewed: 04/03/2013 Exit[employer] Patient Information 2015 [employer]. This information is not intended to replace advice given to you by your health care provider. Make sure you discuss any questions you have with your health care provider.  

## 2014-08-29 LAB — NMR, LIPOPROFILE
Cholesterol: 108 mg/dL (ref 100–199)
HDL Cholesterol by NMR: 41 mg/dL (ref >39)
HDL Particle Number: 29.5 umol/L — ABNORMAL LOW (ref >=30.5)
LDL Particle Number: 622 nmol/L (ref <1000)
LDL Size: 20.5 nm (ref >20.5)
LDLC SERPL CALC-MCNC: 42 mg/dL (ref 0–99)
LP-IR Score: 51 — ABNORMAL HIGH (ref <=45)
Small LDL Particle Number: 361 nmol/L (ref <=527)
Triglycerides by NMR: 123 mg/dL (ref 0–149)

## 2014-08-29 LAB — CMP14+EGFR
ALT: 10 IU/L (ref 0–32)
AST: 13 IU/L (ref 0–40)
Albumin/Globulin Ratio: 1.8 (ref 1.1–2.5)
Albumin: 4.1 g/dL (ref 3.5–5.5)
Alkaline Phosphatase: 74 IU/L (ref 39–117)
BUN/Creatinine Ratio: 6 — ABNORMAL LOW (ref 9–23)
BUN: 7 mg/dL (ref 6–24)
CO2: 21 mmol/L (ref 18–29)
Calcium: 8.8 mg/dL (ref 8.7–10.2)
Chloride: 103 mmol/L (ref 97–108)
Creatinine, Ser: 1.22 mg/dL — ABNORMAL HIGH (ref 0.57–1.00)
GFR calc Af Amer: 58 mL/min/{1.73_m2} — ABNORMAL LOW (ref >59)
GFR calc non Af Amer: 50 mL/min/{1.73_m2} — ABNORMAL LOW (ref >59)
Globulin, Total: 2.3 g/dL (ref 1.5–4.5)
Glucose: 96 mg/dL (ref 65–99)
Potassium: 3.7 mmol/L (ref 3.5–5.2)
Sodium: 140 mmol/L (ref 134–144)
Total Bilirubin: 0.2 mg/dL (ref 0.0–1.2)
Total Protein: 6.4 g/dL (ref 6.0–8.5)

## 2014-09-19 ENCOUNTER — Other Ambulatory Visit: Payer: Medicaid Other

## 2014-09-19 ENCOUNTER — Other Ambulatory Visit: Payer: Self-pay | Admitting: Orthopedic Surgery

## 2014-09-19 ENCOUNTER — Ambulatory Visit (INDEPENDENT_AMBULATORY_CARE_PROVIDER_SITE_OTHER): Payer: Medicaid Other

## 2014-09-19 DIAGNOSIS — M545 Low back pain: Secondary | ICD-10-CM

## 2014-09-19 DIAGNOSIS — R52 Pain, unspecified: Secondary | ICD-10-CM

## 2014-09-20 ENCOUNTER — Ambulatory Visit (HOSPITAL_COMMUNITY): Payer: Medicaid Other

## 2014-09-27 ENCOUNTER — Ambulatory Visit (HOSPITAL_COMMUNITY)
Admission: RE | Admit: 2014-09-27 | Discharge: 2014-09-27 | Disposition: A | Discharge status: Home / Self Care | Payer: Medicaid Other | Source: Ambulatory Visit | Attending: Nurse Practitioner | Admitting: Nurse Practitioner

## 2014-09-27 DIAGNOSIS — Z1231 Encounter for screening mammogram for malignant neoplasm of breast: Secondary | ICD-10-CM | POA: Insufficient documentation

## 2014-10-07 ENCOUNTER — Telehealth: Payer: Self-pay | Admitting: Nurse Practitioner

## 2014-10-07 NOTE — Telephone Encounter (Signed)
Aware,normal mammogram results.

## 2014-10-09 ENCOUNTER — Other Ambulatory Visit: Payer: Self-pay | Admitting: Nurse Practitioner

## 2014-10-09 MED ORDER — OMEPRAZOLE 40 MG PO CPDR: 40.0000 mg | DELAYED_RELEASE_CAPSULE | Freq: Every day | ORAL | Status: DC

## 2014-10-09 NOTE — Telephone Encounter (Signed)
done

## 2014-11-12 ENCOUNTER — Other Ambulatory Visit: Payer: Self-pay | Admitting: Nurse Practitioner

## 2014-11-12 ENCOUNTER — Telehealth: Payer: Self-pay | Admitting: *Deleted

## 2014-11-12 NOTE — Telephone Encounter (Signed)
Last seen 08/28/14  MMM If approved print and route to nurse 

## 2014-11-12 NOTE — Telephone Encounter (Signed)
Script ready.

## 2014-11-12 NOTE — Telephone Encounter (Signed)
Ultram rx ready for pick up  

## 2014-11-28 ENCOUNTER — Ambulatory Visit: Payer: Medicaid Other | Admitting: Nurse Practitioner

## 2014-12-14 ENCOUNTER — Other Ambulatory Visit: Payer: Self-pay | Admitting: Nurse Practitioner

## 2014-12-16 NOTE — Telephone Encounter (Signed)
Patient notified that rx up front and ready to pick up. 

## 2014-12-16 NOTE — Telephone Encounter (Signed)
Last seen 08/28/14  MMM If approved print and route to nurse 

## 2014-12-16 NOTE — Telephone Encounter (Signed)
Patient NTBS for follow up and lab work rx ready for pick up  

## 2014-12-19 NOTE — Telephone Encounter (Signed)
This encounter was created in error - please disregard.

## 2014-12-20 ENCOUNTER — Ambulatory Visit: Payer: Medicaid Other | Admitting: Nurse Practitioner

## 2014-12-30 ENCOUNTER — Ambulatory Visit (INDEPENDENT_AMBULATORY_CARE_PROVIDER_SITE_OTHER): Payer: Medicaid Other | Admitting: Nurse Practitioner

## 2014-12-30 ENCOUNTER — Encounter: Payer: Self-pay | Admitting: Nurse Practitioner

## 2014-12-30 VITALS — BP 138/82 | HR 84 | Temp 97.0°F | Ht 65.0 in | Wt 299.0 lb

## 2014-12-30 DIAGNOSIS — I2510 Atherosclerotic heart disease of native coronary artery without angina pectoris: Secondary | ICD-10-CM

## 2014-12-30 DIAGNOSIS — E785 Hyperlipidemia, unspecified: Secondary | ICD-10-CM

## 2014-12-30 DIAGNOSIS — K219 Gastro-esophageal reflux disease without esophagitis: Secondary | ICD-10-CM

## 2014-12-30 DIAGNOSIS — E0843 Diabetes mellitus due to underlying condition with diabetic autonomic (poly)neuropathy: Secondary | ICD-10-CM

## 2014-12-30 DIAGNOSIS — G4700 Insomnia, unspecified: Secondary | ICD-10-CM

## 2014-12-30 DIAGNOSIS — I2584 Coronary atherosclerosis due to calcified coronary lesion: Secondary | ICD-10-CM

## 2014-12-30 DIAGNOSIS — E1310 Other specified diabetes mellitus with ketoacidosis without coma: Secondary | ICD-10-CM

## 2014-12-30 DIAGNOSIS — R609 Edema, unspecified: Secondary | ICD-10-CM

## 2014-12-30 DIAGNOSIS — F411 Generalized anxiety disorder: Secondary | ICD-10-CM

## 2014-12-30 DIAGNOSIS — E1110 Type 2 diabetes mellitus with ketoacidosis without coma: Secondary | ICD-10-CM

## 2014-12-30 DIAGNOSIS — I10 Essential (primary) hypertension: Secondary | ICD-10-CM

## 2014-12-30 LAB — POCT GLYCOSYLATED HEMOGLOBIN (HGB A1C): Hemoglobin A1C: 6.5

## 2014-12-30 MED ORDER — OMEPRAZOLE 40 MG PO CPDR: 40.0000 mg | DELAYED_RELEASE_CAPSULE | Freq: Every day | ORAL | Status: DC

## 2014-12-30 MED ORDER — FUROSEMIDE 80 MG PO TABS: 80.0000 mg | ORAL_TABLET | Freq: Two times a day (BID) | ORAL | Status: DC

## 2014-12-30 MED ORDER — GABAPENTIN 300 MG PO CAPS: 300.0000 mg | ORAL_CAPSULE | Freq: Three times a day (TID) | ORAL | Status: DC

## 2014-12-30 MED ORDER — LISINOPRIL 20 MG PO TABS: 20.0000 mg | ORAL_TABLET | Freq: Every day | ORAL | Status: DC

## 2014-12-30 MED ORDER — ROSUVASTATIN CALCIUM 20 MG PO TABS: 20.0000 mg | ORAL_TABLET | Freq: Every day | ORAL | Status: DC

## 2014-12-30 NOTE — Patient Instructions (Signed)

## 2014-12-30 NOTE — Progress Notes (Signed)
Subjective:    Patient ID: [name], female    DOB: [birth year], 54 y.o.   MRN: [id number]  Patient here today for follow up of chronic medical problems.  No complaints today.   Diabetes She presents for her follow-up diabetic visit. She has type 2 diabetes mellitus. No MedicAlert identification noted. Her disease course has been stable. There are no hypoglycemic associated symptoms. Associated symptoms include fatigue and visual change. Risk factors for coronary artery disease include diabetes mellitus, dyslipidemia, hypertension, sedentary lifestyle and post-menopausal. When asked about current treatments, none were reported. She is compliant with treatment most of the time. Her weight is stable. She is following a diabetic diet. She has not had a previous visit with a dietitian. There is no change in her home blood glucose trend. Her breakfast blood glucose is taken between 8-9 am. Her breakfast blood glucose range is generally 130-140 mg/dl. Her overall blood glucose range is 130-140 mg/dl. An ACE inhibitor/angiotensin II receptor blocker is being taken. She does not see a podiatrist.Eye exam is not current.  Hyperlipidemia This is a chronic problem. The current episode started more than 1 year ago. The problem is uncontrolled. Recent lipid tests were reviewed and are normal. Exacerbating diseases include diabetes and obesity. She has no history of hypothyroidism. Associated symptoms include shortness of breath. Current antihyperlipidemic treatment includes statins. The current treatment provides moderate improvement of lipids. Compliance problems include adherence to diet and adherence to exercise.  Risk factors for coronary artery disease include diabetes mellitus, dyslipidemia, family history, obesity, hypertension, post-menopausal and a sedentary lifestyle.  Peripheral Neuropathy  Patient is on neuropathy and says that her feet are still burning bad- Leg pain & Spasm /arthritis Pt taking  flexeril-Pt states it helps- Has to walk with a cane in order to keep from falling. Wants something for pain GERD Patient  Only taking TUMS and zantac- says it is daily- OTC meds not helping Peripheral edema Swelling of bil lower ext has worsened over the last month. Taking lasix daily.     Review of Systems  Constitutional: Positive for fatigue.  HENT: Negative.   Respiratory: Positive for shortness of breath.   Genitourinary: Negative.   Neurological: Negative.   Psychiatric/Behavioral: Negative.   All other systems reviewed and are negative.      Objective:   Physical Exam  Constitutional: She is oriented to person, place, and time. She appears well-developed and well-nourished.  HENT:  Head: Normocephalic.  Right Ear: External ear normal.  Left Ear: External ear normal.  Eyes: Pupils are equal, round, and reactive to light.  Neck: Normal range of motion. Neck supple. No thyromegaly present.  Cardiovascular: Normal rate, regular rhythm, normal heart sounds and intact distal pulses.   Pulmonary/Chest: Effort normal and breath sounds normal. No respiratory distress.  Abdominal: Soft. Bowel sounds are normal. She exhibits no distension. There is no tenderness.  Musculoskeletal: Normal range of motion. She exhibits edema (3+ edema bilaterally in feet).  Neurological: She is alert and oriented to person, place, and time.  Skin: Skin is warm and dry. No rash noted. No erythema. No pallor.  Psychiatric: She has a normal mood and affect. Her behavior is normal. Judgment and thought content normal.  Vitals reviewed.  BP 138/82 mmHg  Pulse 84  Temp(Src) 97 F (36.1 C) (Oral)  Ht '5\' 5"'  (1.651 m)  Wt 299 lb (135.626 kg)  BMI 49.76 kg/m2  Results for orders placed or performed in visit on 12/30/14  POCT glycosylated  hemoglobin (Hb A1C)  Result Value Ref Range   Hemoglobin A1C 6.5%          Assessment & Plan:   1. DM (diabetes mellitus) type 2, uncontrolled, with  ketoacidosis Strict carb counting Start back on metformin 500 qd Keep diary of blood sugars - POCT glycosylated hemoglobin (Hb A1C)  2. Hyperlipidemia with target LDL less than 100 Low fat diet - NMR, lipoprofile - rosuvastatin (CRESTOR) 20 MG tablet; Take 1 tablet (20 mg total) by mouth at bedtime.  Dispense: 30 tablet; Refill: 5  3. Essential hypertension, benign Do not add salt to diet - CMP14+EGFR - lisinopril (PRINIVIL,ZESTRIL) 20 MG tablet; Take 1 tablet (20 mg total) by mouth daily.  Dispense: 30 tablet; Refill: 5  4. Coronary artery disease due to calcified coronary lesion  5. Gastroesophageal reflux disease without esophagitis - omeprazole (PRILOSEC) 40 MG capsule; Take 1 capsule (40 mg total) by mouth daily.  Dispense: 30 capsule; Refill: 5  6. Diabetic autonomic neuropathy associated with diabetes mellitus due to underlying condition - gabapentin (NEURONTIN) 300 MG capsule; Take 1 capsule (300 mg total) by mouth 3 (three) times daily.  Dispense: 90 capsule; Refill: 1  7. Peripheral edema Elevate legs when sitting - furosemide (LASIX) 80 MG tablet; Take 1 tablet (80 mg total) by mouth 2 (two) times daily.  Dispense: 60 tablet; Refill: 3  8. Morbid obesity Discussed diet and exercise for person with BMI >25 Will recheck weight in 3-6 months   9. Insomnia Bedtime ritual  10. Generalized anxiety disorder Stress management    Labs pending Health maintenance reviewed Diet and exercise encouraged Continue all meds Follow up  In 3 month   Chamberlain, FNP

## 2014-12-31 LAB — CMP14+EGFR
ALT: 9 IU/L (ref 0–32)
AST: 11 IU/L (ref 0–40)
Albumin/Globulin Ratio: 1.8 (ref 1.1–2.5)
Albumin: 4.5 g/dL (ref 3.5–5.5)
Alkaline Phosphatase: 114 IU/L (ref 39–117)
BUN/Creatinine Ratio: 13 (ref 9–23)
BUN: 20 mg/dL (ref 6–24)
Bilirubin Total: 0.3 mg/dL (ref 0.0–1.2)
CO2: 25 mmol/L (ref 18–29)
Calcium: 9.4 mg/dL (ref 8.7–10.2)
Chloride: 96 mmol/L — ABNORMAL LOW (ref 97–108)
Creatinine, Ser: 1.59 mg/dL — ABNORMAL HIGH (ref 0.57–1.00)
GFR calc Af Amer: 42 mL/min/{1.73_m2} — ABNORMAL LOW (ref >59)
GFR calc non Af Amer: 36 mL/min/{1.73_m2} — ABNORMAL LOW (ref >59)
Globulin, Total: 2.5 g/dL (ref 1.5–4.5)
Glucose: 104 mg/dL — ABNORMAL HIGH (ref 65–99)
Potassium: 3.2 mmol/L — ABNORMAL LOW (ref 3.5–5.2)
Sodium: 141 mmol/L (ref 134–144)
Total Protein: 7 g/dL (ref 6.0–8.5)

## 2014-12-31 LAB — NMR, LIPOPROFILE
Cholesterol: 127 mg/dL (ref 100–199)
HDL Cholesterol by NMR: 48 mg/dL (ref >39)
HDL Particle Number: 36.5 umol/L (ref >=30.5)
LDL Particle Number: 847 nmol/L (ref <1000)
LDL Size: 19.9 nm (ref >20.5)
LDL-C: 37 mg/dL (ref 0–99)
LP-IR Score: 61 — ABNORMAL HIGH (ref <=45)
Small LDL Particle Number: 676 nmol/L — ABNORMAL HIGH (ref <=527)
Triglycerides by NMR: 211 mg/dL — ABNORMAL HIGH (ref 0–149)

## 2015-01-15 ENCOUNTER — Ambulatory Visit: Payer: Medicaid Other | Admitting: Nurse Practitioner

## 2015-02-04 ENCOUNTER — Telehealth: Payer: Self-pay | Admitting: Nurse Practitioner

## 2015-02-13 ENCOUNTER — Other Ambulatory Visit: Payer: Self-pay | Admitting: Orthopedic Surgery

## 2015-02-13 DIAGNOSIS — M5416 Radiculopathy, lumbar region: Secondary | ICD-10-CM

## 2015-02-13 DIAGNOSIS — M48061 Spinal stenosis, lumbar region without neurogenic claudication: Secondary | ICD-10-CM

## 2015-02-14 ENCOUNTER — Other Ambulatory Visit: Payer: Self-pay | Admitting: Nurse Practitioner

## 2015-02-14 NOTE — Telephone Encounter (Signed)
Ultram ready for pick up Please give blood sugar meter form lab

## 2015-02-14 NOTE — Telephone Encounter (Signed)
Patient aware and will come by for rx and new meter

## 2015-02-25 ENCOUNTER — Encounter: Payer: Self-pay | Admitting: Nurse Practitioner

## 2015-02-25 ENCOUNTER — Ambulatory Visit (INDEPENDENT_AMBULATORY_CARE_PROVIDER_SITE_OTHER): Payer: Medicaid Other | Admitting: Nurse Practitioner

## 2015-02-25 VITALS — BP 108/65 | HR 77 | Ht 65.0 in | Wt 300.2 lb

## 2015-02-25 DIAGNOSIS — E0843 Diabetes mellitus due to underlying condition with diabetic autonomic (poly)neuropathy: Secondary | ICD-10-CM

## 2015-02-25 DIAGNOSIS — G4730 Sleep apnea, unspecified: Secondary | ICD-10-CM | POA: Diagnosis not present

## 2015-02-25 NOTE — Patient Instructions (Signed)
Continue aspirin daily Continue to use BiPAP at current settings Follow-up with Dr. Vickey Hugerohmeier in 6 months Use cane or walker at all times for safe for ambulation

## 2015-02-25 NOTE — Progress Notes (Signed)
[name]  PATIENT: [name] DOB: 06/11/1961   REASON FOR VISIT: Follow-up for history of left-sided numbness, diabetic neuropathy and obstructive sleep apnea. HISTORY FROM: Patient and daughter   HISTORY OF PRESENT ILLNESS:[name] is a 54 year old female with a history of OSA on CPAP and left sided numbness. She returns today for follow-up. She was last seen in this office by Butch PennyMegan Millikan NP 08/19/2014.  Regarding the Left sided numbness, acute infarct has been ruled out. The patient had carotid dopplers and 2-D echo that was normal. She reports that the numbness has remained the same. She also has a history of diabetes and diabetic neuropathy with complaints of numbness in the feet.  She has also been having sharp pain in the temporal area bilaterally that extends across the forehead. This is intermittent and usually results in a headache but it does resolve. She will occasionally wake up with a dull morning headache. She states that her left eye will sometimes have "yellow discharge." she notices more when she wakes up from sleeping. Her PCP gave her gabapentin but she has not noticed any benefit with this medication. She also was diagnosed with OSA and was placed on Bipap- her settings were recently changed. At the last visit her compliance was not where it needed to be. She did not bring her machine today .She also uses oxygen and states that she would rather just use the oxygen rather than the CPAP. She has been complaining of back pain and says Dr. Ciro BackerGiofree is doing a "dye test" on Thursday this week. She also claims the cartilage in her knees is worn out and her legs no longer support her weight. She is morbidly obese. She sometimes uses a walker to ambulate, she has a single-point cane today. She returns for reevaluation   HISTORY 08/08/13 (YY): [name] is a 54 years old right-handed Caucasian female, referred by her primary care physician Dr. Paulene FloorMary Martin is, and  optometrist Dr. Despina AriasYen Le for evaluation of left-sided numbness  About a month ago, she noticed sudden onset left facial numbness, also noticed numbness involving her left arm, leg, body, she denies significant weakness, she was recently diagnosed with diabetes in November 2013, she also complains of bilateral feet paresthesia, burning sensation, she has gait difficulty because of her feet pain, joints pain  MRI brain in Sep 26th 2014: showed no evidence for acute infarction, Asymmetric left much greater than right middle ear and mastoid fluid is observed    REVIEW OF SYSTEMS: Full 14 system review of systems performed and notable only for those listed, all others are neg:  Constitutional: nFatigue Cardiovascular: neg Ear/Nose/Throat:nHearing loss Skin: neg Eyes:  Discharge, itching, redness blurred vision Respiratory:  Shortness of breath, cough, wheezing Gastroitestinal:  Constipation, diarrhea Hematology/Lymphatic:  Easy bruising Endocrine: neg Musculoskeletal:n Joint swelling, joint pain, back pain, walking difficulty, neck stiffness Allergy/Immunology: neg Neurological: neg Psychiatric:  Depression and anxiety Sleep : neg   ALLERGIES: Allergies  Allergen Reactions  . Morphine And Related Nausea And Vomiting    HOME MEDICATIONS: Outpatient Prescriptions Prior to Visit  Medication Sig Dispense Refill  . aspirin EC 325 MG tablet Take 325 mg by mouth daily.    . furosemide (LASIX) 80 MG tablet Take 1 tablet (80 mg total) by mouth 2 (two) times daily. 60 tablet 3  . gabapentin (NEURONTIN) 300 MG capsule Take 1 capsule (300 mg total) by mouth 3 (three) times daily. 90 capsule 1  . omeprazole (PRILOSEC)  40 MG capsule Take 1 capsule (40 mg total) by mouth daily. 30 capsule 5  . rosuvastatin (CRESTOR) 20 MG tablet Take 1 tablet (20 mg total) by mouth at bedtime. 30 tablet 5  . traMADol (ULTRAM) 50 MG tablet TAKE ONE TABLET BY MOUTH EVERY 12 HOURS AS NEEDED 60 tablet 0  . budesonide  (RHINOCORT AQUA) 32 MCG/ACT nasal spray Place 1 spray into both nostrils daily. (Patient not taking: Reported on 02/25/2015) 1 Bottle 5  . fluticasone (FLONASE ALLERGY RELIEF) 50 MCG/ACT nasal spray Place 2 sprays into both nostrils daily. (Patient not taking: Reported on 02/25/2015) 2 g 2  . lisinopril (PRINIVIL,ZESTRIL) 20 MG tablet Take 1 tablet (20 mg total) by mouth daily. (Patient not taking: Reported on 02/25/2015) 30 tablet 5  . meloxicam (MOBIC) 15 MG tablet Take 15 mg by mouth daily.     No facility-administered medications prior to visit.    PAST MEDICAL HISTORY: Past Medical History  Diagnosis Date  . GERD (gastroesophageal reflux disease)   . Hyperlipidemia   . Noncompliance   . CAD (coronary artery disease)     post PTCA with stent placement  . Morbid obesity   . Diabetes mellitus without complication   . Hypertension   . HA (headache)   . Left-sided face pain     PAST SURGICAL HISTORY: Past Surgical History  Procedure Laterality Date  . Cholecystectomy    . Abdominal hysterectomy    . Ptca      WITH STENT PLACEMENT  . Colonoscopy N/A 04/10/2013    Procedure: COLONOSCOPY;  Surgeon: Malissa Hippo, MD;  Location: [facility];  Service: Endoscopy;  Laterality: N/A;  . Eye surgery      FAMILY HISTORY: Family History  Problem Relation Age of Onset  . Coronary artery disease Father   . Cancer - Colon Father   . Breast cancer Mother   . Diabetes Father   . High Cholesterol Father     SOCIAL HISTORY: History   Social History  . Marital Status: Married    Spouse Name: [name]  . Number of Children: 5  . Years of Education: 10 th   Occupational History  .      disabled   Social History Main Topics  . Smoking status: Current Every Day Smoker -- 1.00 packs/day for 35 years    Types: Cigarettes  . Smokeless tobacco: Never Used  . Alcohol Use: No  . Drug Use: No  . Sexual Activity: No   Other Topics Concern  . Not on file   Social History Narrative     Patient lives at home with her husband and grandchild [name]). Patient is disabled.   Patient has 10 th grade education.   Right handed.   Caffeine- one  cup of coffee and  One soda Dr.Pepper/ tea. daily     PHYSICAL EXAM  Filed Vitals:   02/25/15 1012  BP: 108/65  Pulse: 77  Height:  (1.651 m)  Weight: 300 lb 3.2 oz (136.17 kg)   Body mass index is 49.96 kg/(m^2). Generalized: Well developed,  morbidly obese female in no acute distress, Skin : pitting edema 2-3+ in lower extremities.   Neurological examination  Mentation: Alert oriented to time, place, history taking. Follows all commands speech and language fluent ESS 19.FSS 15 Cranial nerve II-XII: Pupils were equal round reactive to light. Extraocular movements were full, visual field were full on confrontational test. Facial sensation and strength were normal. Uvula tongue midline.  Head turning and shoulder shrug were normal and symmetric. Motor: The motor testing reveals 5 over 5 strength of all 4 extremities. Good symmetric motor tone is noted throughout.  Sensory: Sensory testing is intact to soft touch on all 4 extremities except decreased on the left side of face and left arm. Pinprick decrease on left side of face and left arm.  Coordination: Cerebellar testing reveals good finger-nose-finger and heel-to-shin bilaterally.  Gait and station: Walks with a cane, gait is slightly unsteady contributes to leg swelling and knee pain.  Reflexes: Deep tendon reflexes are symmetric and normal bilaterally.     DIAGNOSTIC DATA (LABS, IMAGING, TESTING) - I reviewed patient records, labs, notes, testing and imaging myself where available.  Lab Results  Component Value Date   WBC 6.7 05/22/2014   HGB 10.3* 05/22/2014   HCT 30.8* 05/22/2014   MCV 102* 05/22/2014   PLT 159 05/22/2014      Component Value Date/Time   NA 141 12/30/2014 1537   NA 140 04/30/2014 1530   K 3.2* 12/30/2014 1537   CL 96* 12/30/2014  1537   CO2 25 12/30/2014 1537   GLUCOSE 104* 12/30/2014 1537   GLUCOSE 116* 04/30/2014 1530   BUN 20 12/30/2014 1537   BUN 49* 04/30/2014 1530   CREATININE 1.59* 12/30/2014 1537   CALCIUM 9.4 12/30/2014 1537   PROT 7.0 12/30/2014 1537   PROT 7.0 04/30/2014 1530   ALBUMIN 3.9 04/30/2014 1530   AST 11 12/30/2014 1537   ALT 9 12/30/2014 1537   ALKPHOS 114 12/30/2014 1537   BILITOT 0.3 12/30/2014 1537   BILITOT 0.2 08/28/2014 1037   GFRNONAA 36* 12/30/2014 1537   GFRAA 42* 12/30/2014 1537   Lab Results  Component Value Date   CHOL 127 12/30/2014   HDL 48 12/30/2014   LDLCALC 42 08/28/2014   TRIG 211* 12/30/2014   CHOLHDL 4.5 04/09/2013   Lab Results  Component Value Date   HGBA1C 6.5% 12/30/2014    ASSESSMENT AND PLAN  54 y.o. year old female  has a past medical history of GERD (gastroesophageal reflux disease); Hyperlipidemia; Noncompliance; CAD (coronary artery disease); Morbid obesity; Diabetes mellitus without complication; Hypertension; HA (headache); and Left-sided face pain. here  to follow-up. She has little understanding of diabetic neuropathy. Her left-sided facial numbness is unchanged.The patient is a current patient of Dr. Vickey Huger  who is out of the office today . This note is sent to the work in doctor.     Continue aspirin daily Continue to use BiPAP at current settings Follow-up with Dr. Vickey Huger in 6 months for sleep follow up and Butch Penny after that.  Use cane or walker at all times for safe for ambulation Given written information on diabetic neuropathy to include signs and symptoms and treatment Vst time 20 min Nilda Riggs, [facility], [facility], APRN  [facility] [address] [place], [place] [address] [phone]

## 2015-02-27 ENCOUNTER — Other Ambulatory Visit: Payer: Medicaid Other

## 2015-03-07 ENCOUNTER — Ambulatory Visit
Admission: RE | Admit: 2015-03-07 | Discharge: 2015-03-07 | Disposition: A | Discharge status: Home / Self Care | Payer: Medicaid Other | Source: Ambulatory Visit | Attending: Orthopedic Surgery | Admitting: Orthopedic Surgery

## 2015-03-07 DIAGNOSIS — M5416 Radiculopathy, lumbar region: Secondary | ICD-10-CM

## 2015-03-07 DIAGNOSIS — M48061 Spinal stenosis, lumbar region without neurogenic claudication: Secondary | ICD-10-CM

## 2015-03-07 MED ORDER — MEPERIDINE HCL 100 MG/ML IJ SOLN
100.0000 mg | Freq: Once | INTRAMUSCULAR | Status: AC
  Administered 2015-03-07: 100 mg via INTRAMUSCULAR

## 2015-03-07 MED ORDER — DIAZEPAM 5 MG PO TABS
10.0000 mg | ORAL_TABLET | Freq: Once | ORAL | Status: AC
  Administered 2015-03-07: 10 mg via ORAL

## 2015-03-07 MED ORDER — ONDANSETRON HCL 4 MG/2ML IJ SOLN
4.0000 mg | Freq: Once | INTRAMUSCULAR | Status: AC
  Administered 2015-03-07: 4 mg via INTRAMUSCULAR

## 2015-03-07 MED ORDER — IOHEXOL 180 MG/ML  SOLN
15.0000 mL | Freq: Once | INTRAMUSCULAR | Status: AC | PRN
  Administered 2015-03-07: 15 mL via INTRATHECAL

## 2015-03-07 MED ORDER — ONDANSETRON HCL 4 MG/2ML IJ SOLN: 4.0000 mg | Freq: Four times a day (QID) | INTRAMUSCULAR | Status: DC | PRN

## 2015-03-07 NOTE — Discharge Instructions (Signed)
Myelogram Discharge Instructions  1. Go home and rest quietly for the next 24 hours.  It is important to lie flat for the next 24 hours.  Get up only to go to the restroom.  You may lie in the bed or on a couch on your back, your stomach, your left side or your right side.  You may have one pillow under your head.  You may have pillows between your knees while you are on your side or under your knees while you are on your back.  2. DO NOT drive today.  Recline the seat as far back as it will go, while still wearing your seat belt, on the way home.  3. You may get up to go to the bathroom as needed.  You may sit up for 10 minutes to eat.  You may resume your normal diet and medications unless otherwise indicated.  Drink lots of extra fluids today and tomorrow.  4. The incidence of headache, nausea, or vomiting is about 5% (one in 20 patients).  If you develop a headache, lie flat and drink plenty of fluids until the headache goes away.  Caffeinated beverages may be helpful.  If you develop severe nausea and vomiting or a headache that does not go away with flat bed rest, call [phone].  5. You may resume normal activities after your 24 hours of bed rest is over; however, do not exert yourself strongly or do any heavy lifting tomorrow. If when you get up you have a headache when standing, go back to bed and force fluids for another 24 hours.  6. Call your physician for a follow-up appointment.  The results of your myelogram will be sent directly to your physician by the following day.  7. If you have any questions or if complications develop after you arrive home, please call [phone].  Discharge instructions have been explained to the patient.  The patient, or the person responsible for the patient, fully understands these instructions.       May resume Tramadol on March 08, 2015, after 8:00 am.

## 2015-03-07 NOTE — Progress Notes (Signed)
Patient states she has been off Tramadol for at least the past two days. 

## 2015-03-11 NOTE — Progress Notes (Signed)
I reviewed note and agree with plan.   VIKRAM R. PENUMALLI, MD  Certified in Neurology, Neurophysiology and Neuroimaging  [name] [address], [address] [place], [place] [address] [phone]   

## 2015-03-20 ENCOUNTER — Encounter: Payer: Self-pay | Admitting: *Deleted

## 2015-03-20 ENCOUNTER — Encounter: Payer: Self-pay | Admitting: Cardiology

## 2015-03-20 ENCOUNTER — Ambulatory Visit (INDEPENDENT_AMBULATORY_CARE_PROVIDER_SITE_OTHER): Payer: Medicaid Other | Admitting: Cardiology

## 2015-03-20 VITALS — BP 122/80 | HR 80 | Ht 62.0 in | Wt 297.0 lb

## 2015-03-20 DIAGNOSIS — E782 Mixed hyperlipidemia: Secondary | ICD-10-CM

## 2015-03-20 DIAGNOSIS — Z01810 Encounter for preprocedural cardiovascular examination: Secondary | ICD-10-CM

## 2015-03-20 DIAGNOSIS — I2510 Atherosclerotic heart disease of native coronary artery without angina pectoris: Secondary | ICD-10-CM | POA: Diagnosis not present

## 2015-03-20 NOTE — Patient Instructions (Addendum)
Your physician recommends that you continue on your current medications as directed. Please refer to the Current Medication list given to you today. Your physician has requested that you have a lexiscan myoview 2 day protocol. For further information please visit [email or web address]. Please follow instruction sheet, as given. Your physician recommends that you schedule a follow-up appointment in: 6 months. You will receive a reminder letter in the mail in about 4 months reminding you to call and schedule your appointment. If you don't receive this letter, please contact our office. We will call you with your results.

## 2015-03-20 NOTE — Progress Notes (Signed)
Cardiology Office Note  Date: 03/20/2015   ID: [name], DOB 01/18/1961, MRN [id number]  PCP: Stacey PieriniMARTIN,MARY MARGARET, FNP  Consulting Cardiologist: Stacey DellSamuel Vaeda Westall, MD   Chief Complaint  Patient presents with  . Preoperative evaluation  . Coronary Artery Disease    History of Present Illness: [name] is a 54 y.o. female referred to the office for preoperative cardiac evaluation by [name] prior to planned lumbar decompression surgery. I reviewed her records, she is a former patient of Dr. Andee [name] last seen in 2012. Cardiac history includes BMS to the circumflex in 2007 with Dr. Juanda [name]. She has been relatively stable over the years, chronically short of breath, did have interval evaluation with right heart catheterization that did not show any evidence of pulmonary hypertension.  She is here today with her husband. She does not endorse any progressing angina symptoms, does tell me that she had chest pain symptoms last summer. Follow-up ECG today is reviewed below.  We discussed her medications. Lipids have been followed by primary care provider on Crestor. He has not had follow-up interval ischemic testing in the last 5 years.  Main functional limitation at this time is related to chronic lower back and leg pain as well as leg weakness.   Past Medical History  Diagnosis Date  . GERD (gastroesophageal reflux disease)   . Hyperlipidemia   . Noncompliance   . CAD (coronary artery disease)     BMS to circumflex 2007 - Dr. Juanda [name]  . Morbid obesity   . Type 2 diabetes mellitus   . Essential hypertension   . HA (headache)   . Left-sided face pain   . OSA (obstructive sleep apnea)     Past Surgical History  Procedure Laterality Date  . Cholecystectomy    . Abdominal hysterectomy    . Colonoscopy N/A 04/10/2013    Procedure: COLONOSCOPY;  Surgeon: Stacey HippoNajeeb U Rehman, MD;  Location: [facility];  Service: Endoscopy;  Laterality: N/A;  . Eye surgery      Current  Outpatient Prescriptions  Medication Sig Dispense Refill  . aspirin EC 325 MG tablet Take 325 mg by mouth daily.    . budesonide (RHINOCORT AQUA) 32 MCG/ACT nasal spray Place 1 spray into both nostrils daily. 1 Bottle 5  . fluticasone (FLONASE ALLERGY RELIEF) 50 MCG/ACT nasal spray Place 2 sprays into both nostrils daily. 2 g 2  . furosemide (LASIX) 80 MG tablet Take 1 tablet (80 mg total) by mouth 2 (two) times daily. 60 tablet 3  . gabapentin (NEURONTIN) 300 MG capsule Take 1 capsule (300 mg total) by mouth 3 (three) times daily. 90 capsule 1  . lisinopril (PRINIVIL,ZESTRIL) 20 MG tablet Take 1 tablet (20 mg total) by mouth daily. 30 tablet 5  . meloxicam (MOBIC) 15 MG tablet Take 15 mg by mouth daily.    . metFORMIN (GLUCOPHAGE) 500 MG tablet Take 500 mg by mouth 2 (two) times daily with a meal.    . omeprazole (PRILOSEC) 40 MG capsule Take 1 capsule (40 mg total) by mouth daily. 30 capsule 5  . oxyCODONE-acetaminophen (PERCOCET) 10-325 MG per tablet Take 1 tablet by mouth every 4 (four) hours as needed for pain.    . rosuvastatin (CRESTOR) 20 MG tablet Take 1 tablet (20 mg total) by mouth at bedtime. 30 tablet 5  . traMADol (ULTRAM) 50 MG tablet TAKE ONE TABLET BY MOUTH EVERY 12 HOURS AS NEEDED 60 tablet 0   No current facility-administered medications for  this visit.    Allergies:  Morphine and related   Social History: The patient  reports that she has been smoking Cigarettes.  She has a 35 pack-year smoking history. She has never used smokeless tobacco. She reports that she does not drink alcohol or use illicit drugs.   Family History: The patient's family history includes Breast cancer in her mother; Cancer - Colon in her father; Coronary artery disease in her father; Diabetes in her father; High Cholesterol in her father.   ROS:  Please see the history of present illness. Otherwise, complete review of systems is positive for chronic back and leg pain as well as leg weakness.  NYHA  class 2-3 dyspnea depending on level of activity. All other systems are reviewed and negative.   Physical Exam: VS:  BP 122/80 mmHg  Pulse 80  Ht 5\' 2"  (1.575 m)  Wt 297 lb (134.718 kg)  BMI 54.31 kg/m2  SpO2 97%, BMI Body mass index is 54.31 kg/(m^2).  Wt Readings from Last 3 Encounters:  03/20/15 297 lb (134.718 kg)  02/25/15 300 lb 3.2 oz (136.17 kg)  12/30/14 299 lb (135.626 kg)     General: Morbidly obese woman, appears comfortable at rest. HEENT: Conjunctiva and lids normal, oropharynx clear with poor dentition. Neck: Supple, no elevated JVP or carotid bruits, no thyromegaly. Lungs: Decreased breath sounds without wheezing, nonlabored breathing at rest. Cardiac: Regular rate and rhythm, no S3 or significant systolic murmur, no pericardial rub. Abdomen: Soft, nontender, bowel sounds present, no guarding or rebound. Extremities: Increased adipose tissue with chronic appearing venous stasis, distal pulses 2+. Skin: Warm and dry. Musculoskeletal: No kyphosis. Neuropsychiatric: Alert and oriented x3, affect grossly appropriate.   ECG: ECG is ordered today and shows sinus rhythm with leftward axis, IVCD, and nonspecific anterior ST-T wave abnormalities which are old.   Recent Labwork: 04/30/2014: Pro B Natriuretic peptide (BNP) 402.5* 05/22/2014: Hemoglobin 10.3*; Platelets 159 12/30/2014: ALT 9; AST 11; BUN 20; Creatinine 1.59*; Potassium 3.2*; Sodium 141     Component Value Date/Time   CHOL 127 12/30/2014 1537   CHOL 114 05/22/2014 1104   TRIG 211* 12/30/2014 1537   TRIG 115 05/22/2014 1104   HDL 48 12/30/2014 1537   HDL 39* 05/22/2014 1104   HDL 44 04/09/2013 1135   CHOLHDL 4.5 04/09/2013 1135   VLDL 37 04/09/2013 1135   LDLCALC 42 08/28/2014 1037   LDLCALC 52 05/22/2014 1104   LDLCALC 115* 04/09/2013 1135    Other Studies Reviewed Today:  Echocardiogram 09/14/2013: Study Conclusions  - Left ventricle: The cavity size was normal. There was moderate concentric  hypertrophy. Systolic function was normal. The estimated ejection fraction was in the range of 55% to 60%. Wall motion was normal; there were no regional wall motion abnormalities. Doppler parameters are consistent with abnormal left ventricular relaxation (grade 1 diastolic dysfunction). - Right ventricle: The cavity size was moderately dilated. Systolic function was mildly to moderately reduced. - Atrial septum: A septal defect cannot be excluded. A patent foramen ovale cannot be excluded. Impressions:  - This was a limited study due to the patient position.   Assessment and Plan:  1. Preoperative cardiac evaluation prior to elective lumbar decompression surgery as outlined above. Patient does not endorse any progressive angina symptoms, although did have some chest pain this past summer. Cardiac history includes BMS to the circumflex in 2007. She has not had follow-up ischemic testing in the last 5 years. ECG is reviewed today. Plan will be to proceed  with a Lexiscan Cardiolite (possibly 2 day protocol) to reassess ischemic burden. Further recommendations from there.  2. CAD status post BMS to the circumflex in 2007. She continues on aspirin, ACE inhibitor, and statin.  3. Morbid obesity.  4. Obstructive sleep apnea on CPAP.  5. Hyperlipidemia, on statin therapy, followed by primary care.  6. Type 2 diabetes mellitus, followed by primary care on Glucophage.  Current medicines were reviewed with the patient today.   Orders Placed This Encounter  Procedures  . NM Myocar Multi W/Spect W/Wall Motion / EF  . Myocardial Perfusion Imaging  . EKG 12-Lead    Disposition: FU with me in 6 months.   Signed, Jonelle Sidle, MD, [facility] 03/20/2015 11:23 AM    [facility] at [facility] [address] [place], [place], [place] [address] Phone: [phone]; Fax: [phone]

## 2015-03-23 ENCOUNTER — Other Ambulatory Visit: Payer: Self-pay | Admitting: Nurse Practitioner

## 2015-03-28 ENCOUNTER — Other Ambulatory Visit: Payer: Self-pay | Admitting: Nurse Practitioner

## 2015-03-28 MED ORDER — GLUCOSE BLOOD VI STRP: ORAL_STRIP | Status: DC

## 2015-03-28 NOTE — Telephone Encounter (Signed)
done

## 2015-03-31 ENCOUNTER — Other Ambulatory Visit: Payer: Self-pay

## 2015-03-31 ENCOUNTER — Ambulatory Visit: Payer: Medicaid Other | Admitting: Nurse Practitioner

## 2015-03-31 MED ORDER — GLUCOSE BLOOD VI STRP: ORAL_STRIP | Status: DC

## 2015-04-01 ENCOUNTER — Encounter (HOSPITAL_COMMUNITY)
Admission: RE | Admit: 2015-04-01 | Discharge: 2015-04-01 | Disposition: A | Discharge status: Home / Self Care | Payer: Medicaid Other | Source: Ambulatory Visit | Attending: Cardiology | Admitting: Cardiology

## 2015-04-01 ENCOUNTER — Encounter (HOSPITAL_COMMUNITY): Admission: RE | Admit: 2015-04-01 | Payer: Medicaid Other | Source: Ambulatory Visit

## 2015-04-01 ENCOUNTER — Encounter (HOSPITAL_COMMUNITY): Payer: Medicaid Other

## 2015-04-01 ENCOUNTER — Inpatient Hospital Stay (HOSPITAL_COMMUNITY): Admission: RE | Admit: 2015-04-01 | Payer: Medicaid Other | Source: Ambulatory Visit

## 2015-04-01 ENCOUNTER — Encounter (HOSPITAL_COMMUNITY): Payer: Self-pay

## 2015-04-01 DIAGNOSIS — Z01810 Encounter for preprocedural cardiovascular examination: Secondary | ICD-10-CM | POA: Diagnosis not present

## 2015-04-01 DIAGNOSIS — I2510 Atherosclerotic heart disease of native coronary artery without angina pectoris: Secondary | ICD-10-CM | POA: Insufficient documentation

## 2015-04-01 MED ORDER — REGADENOSON 0.4 MG/5ML IV SOLN
INTRAVENOUS | Status: AC
  Administered 2015-04-01: 0.4 mg via INTRAVENOUS
  Filled 2015-04-01: qty 5

## 2015-04-01 MED ORDER — TECHNETIUM TC 99M SESTAMIBI GENERIC - CARDIOLITE: 30.0000 | Freq: Once | INTRAVENOUS | Status: AC | PRN

## 2015-04-01 MED ORDER — SODIUM CHLORIDE 0.9 % IJ SOLN: 10.0000 mL | INTRAMUSCULAR | Status: DC | PRN

## 2015-04-01 MED ORDER — SODIUM CHLORIDE 0.9 % IJ SOLN
INTRAMUSCULAR | Status: AC
  Administered 2015-04-01: 10 mL via INTRAVENOUS
  Filled 2015-04-01: qty 3

## 2015-04-01 MED ORDER — REGADENOSON 0.4 MG/5ML IV SOLN
0.4000 mg | Freq: Once | INTRAVENOUS | Status: AC | PRN
  Administered 2015-04-01: 0.4 mg via INTRAVENOUS
  Filled 2015-04-01: qty 5

## 2015-04-02 ENCOUNTER — Encounter (HOSPITAL_COMMUNITY)
Admission: RE | Admit: 2015-04-02 | Discharge: 2015-04-02 | Disposition: A | Discharge status: Home / Self Care | Payer: Medicaid Other | Source: Ambulatory Visit | Attending: Cardiology | Admitting: Cardiology

## 2015-04-02 ENCOUNTER — Encounter (HOSPITAL_COMMUNITY): Payer: Self-pay

## 2015-04-02 DIAGNOSIS — Z01810 Encounter for preprocedural cardiovascular examination: Secondary | ICD-10-CM | POA: Diagnosis not present

## 2015-04-02 DIAGNOSIS — I2510 Atherosclerotic heart disease of native coronary artery without angina pectoris: Secondary | ICD-10-CM | POA: Diagnosis not present

## 2015-04-02 MED ORDER — SODIUM CHLORIDE 0.9 % IJ SOLN
INTRAMUSCULAR | Status: AC
  Filled 2015-04-02: qty 42

## 2015-04-02 MED ORDER — TECHNETIUM TC 99M SESTAMIBI GENERIC - CARDIOLITE
25.0000 | Freq: Once | INTRAVENOUS | Status: AC | PRN
  Administered 2015-04-02: 25 via INTRAVENOUS

## 2015-04-03 ENCOUNTER — Telehealth: Payer: Self-pay | Admitting: Nurse Practitioner

## 2015-04-03 ENCOUNTER — Telehealth: Payer: Self-pay | Admitting: *Deleted

## 2015-04-03 MED ORDER — ACCU-CHEK AVIVA DEVI: Status: DC

## 2015-04-03 NOTE — Telephone Encounter (Signed)
-----   Message from Jonelle SidleSamuel G McDowell, MD sent at 04/02/2015 11:58 AM EDT ----- This is a final report. Cardiolite was low risk without significant ischemia. She should be able to proceed with planned lumbar decompression surgery with Dr. Darrelyn HillockGioffre. No further cardiac testing planned.

## 2015-04-03 NOTE — Telephone Encounter (Signed)
done

## 2015-04-03 NOTE — Telephone Encounter (Signed)
Patient informed and copy sent to Dr. Darrelyn HillockGioffre.

## 2015-04-11 ENCOUNTER — Other Ambulatory Visit: Payer: Self-pay | Admitting: Surgical

## 2015-04-22 ENCOUNTER — Encounter (HOSPITAL_COMMUNITY)
Admission: RE | Admit: 2015-04-22 | Discharge: 2015-04-22 | Disposition: A | Discharge status: Home / Self Care | Payer: Medicaid Other | Source: Ambulatory Visit | Attending: Orthopedic Surgery | Admitting: Orthopedic Surgery

## 2015-04-22 ENCOUNTER — Encounter (INDEPENDENT_AMBULATORY_CARE_PROVIDER_SITE_OTHER): Payer: Self-pay

## 2015-04-22 ENCOUNTER — Ambulatory Visit (HOSPITAL_COMMUNITY)
Admission: RE | Admit: 2015-04-22 | Discharge: 2015-04-22 | Disposition: A | Discharge status: Home / Self Care | Payer: Medicaid Other | Source: Ambulatory Visit | Attending: Surgical | Admitting: Surgical

## 2015-04-22 ENCOUNTER — Encounter (HOSPITAL_COMMUNITY): Payer: Self-pay

## 2015-04-22 DIAGNOSIS — I517 Cardiomegaly: Secondary | ICD-10-CM | POA: Insufficient documentation

## 2015-04-22 DIAGNOSIS — M4800 Spinal stenosis, site unspecified: Secondary | ICD-10-CM | POA: Diagnosis not present

## 2015-04-22 DIAGNOSIS — R0602 Shortness of breath: Secondary | ICD-10-CM | POA: Diagnosis not present

## 2015-04-22 DIAGNOSIS — I700 Atherosclerosis of aorta: Secondary | ICD-10-CM | POA: Diagnosis not present

## 2015-04-22 DIAGNOSIS — R918 Other nonspecific abnormal finding of lung field: Secondary | ICD-10-CM | POA: Diagnosis not present

## 2015-04-22 DIAGNOSIS — Z01818 Encounter for other preprocedural examination: Secondary | ICD-10-CM

## 2015-04-22 DIAGNOSIS — M545 Low back pain: Secondary | ICD-10-CM | POA: Insufficient documentation

## 2015-04-22 DIAGNOSIS — G8929 Other chronic pain: Secondary | ICD-10-CM | POA: Diagnosis not present

## 2015-04-22 DIAGNOSIS — Z01812 Encounter for preprocedural laboratory examination: Secondary | ICD-10-CM | POA: Diagnosis not present

## 2015-04-22 DIAGNOSIS — M8588 Other specified disorders of bone density and structure, other site: Secondary | ICD-10-CM | POA: Diagnosis not present

## 2015-04-22 HISTORY — DX: Unspecified osteoarthritis, unspecified site: M19.90

## 2015-04-22 HISTORY — DX: Repeated falls: R29.6

## 2015-04-22 HISTORY — DX: Unspecified urinary incontinence: R32

## 2015-04-22 LAB — COMPREHENSIVE METABOLIC PANEL
ALT: 16 U/L (ref 14–54)
AST: 16 U/L (ref 15–41)
Albumin: 4.3 g/dL (ref 3.5–5.0)
Alkaline Phosphatase: 98 U/L (ref 38–126)
Anion gap: 12 (ref 5–15)
BUN: 18 mg/dL (ref 6–20)
CO2: 29 mmol/L (ref 22–32)
Calcium: 9.4 mg/dL (ref 8.9–10.3)
Chloride: 101 mmol/L (ref 101–111)
Creatinine, Ser: 1.23 mg/dL — ABNORMAL HIGH (ref 0.44–1.00)
GFR calc Af Amer: 56 mL/min — ABNORMAL LOW (ref >60)
GFR calc non Af Amer: 48 mL/min — ABNORMAL LOW (ref >60)
Glucose, Bld: 106 mg/dL — ABNORMAL HIGH (ref 65–99)
Potassium: 3 mmol/L — ABNORMAL LOW (ref 3.5–5.1)
Sodium: 142 mmol/L (ref 135–145)
Total Bilirubin: 0.4 mg/dL (ref 0.3–1.2)
Total Protein: 7.6 g/dL (ref 6.5–8.1)

## 2015-04-22 LAB — CBC WITH DIFFERENTIAL/PLATELET
Basophils Absolute: 0 10*3/uL (ref 0.0–0.1)
Basophils Relative: 0 % (ref 0–1)
Eosinophils Absolute: 0.4 10*3/uL (ref 0.0–0.7)
Eosinophils Relative: 4 % (ref 0–5)
HCT: 39.1 % (ref 36.0–46.0)
Hemoglobin: 12.6 g/dL (ref 12.0–15.0)
Lymphocytes Relative: 33 % (ref 12–46)
Lymphs Abs: 3.2 10*3/uL (ref 0.7–4.0)
MCH: 29.6 pg (ref 26.0–34.0)
MCHC: 32.2 g/dL (ref 30.0–36.0)
MCV: 91.8 fL (ref 78.0–100.0)
Monocytes Absolute: 0.6 10*3/uL (ref 0.1–1.0)
Monocytes Relative: 7 % (ref 3–12)
Neutro Abs: 5.2 10*3/uL (ref 1.7–7.7)
Neutrophils Relative %: 56 % (ref 43–77)
Platelets: 138 10*3/uL — ABNORMAL LOW (ref 150–400)
RBC: 4.26 MIL/uL (ref 3.87–5.11)
RDW: 15.3 % (ref 11.5–15.5)
WBC: 9.4 10*3/uL (ref 4.0–10.5)

## 2015-04-22 LAB — URINALYSIS, ROUTINE W REFLEX MICROSCOPIC
Bilirubin Urine: NEGATIVE
Glucose, UA: NEGATIVE mg/dL
Hgb urine dipstick: NEGATIVE
Ketones, ur: NEGATIVE mg/dL
Leukocytes, UA: NEGATIVE
Nitrite: NEGATIVE
Protein, ur: NEGATIVE mg/dL
Specific Gravity, Urine: 1.018 (ref 1.005–1.030)
Urobilinogen, UA: 1 mg/dL (ref 0.0–1.0)
pH: 6 (ref 5.0–8.0)

## 2015-04-22 LAB — APTT: aPTT: 38 seconds — ABNORMAL HIGH (ref 24–37)

## 2015-04-22 LAB — PROTIME-INR
INR: 1.03 (ref 0.00–1.49)
Prothrombin Time: 13.7 seconds (ref 11.6–15.2)

## 2015-04-22 LAB — SURGICAL PCR SCREEN
MRSA, PCR: NEGATIVE
Staphylococcus aureus: NEGATIVE

## 2015-04-22 NOTE — Progress Notes (Signed)
Pt states she uses 4L/M O2 [place] during the day if needed. Pt did not have O2 with her during PAT visit. O2 sat within normal limits. Pt states she uses O2 more during night than her CPAP.

## 2015-04-22 NOTE — Progress Notes (Addendum)
CXR in epic per PAT visit 04/22/2015 sent to Dr Darrelyn Hillock CBCD, PTT and CMP results per epic per PAT visit 04/22/2015 sent to Dr Darrelyn Hillock

## 2015-04-22 NOTE — Patient Instructions (Signed)
[name]  04/22/2015   Your procedure is scheduled on: Friday April 25, 2015   Report to [facility] Main  Entrance and follow signs to               Short Stay Center arrive at 10:30 AM.  Call this number if you have problems the morning of surgery [phone]   Remember: ONLY 1 PERSON MAY GO WITH YOU TO SHORT STAY TO GET  READY MORNING OF YOUR SURGERY.  Do not eat food  After Midnight but may take clear liquids till 6:30 am day of surgery then nothing by mouth. Eat a Healthy snack night prior to surgery.               BRING CPAP MASK AND TUBING DAY OF SURGERY.     Take these medicines the morning of surgery with A SIP OF WATER: Gabapentin (Neurontin); Omeprazole (Prilosec); Oxycodone-Acetaminophen if needed                               You may not have any metal on your body including hair pins and              piercings  Do not wear jewelry, make-up, lotions, powders or perfumes, deodorant             Do not wear nail polish.  Do not shave  48 hours prior to surgery.              Do not bring valuables to the hospital. [facility] IS NOT             RESPONSIBLE   FOR VALUABLES.  Contacts, dentures or bridgework may not be worn into surgery.  Leave suitcase in the car. After surgery it may be brought to your room.                  Please read over the following fact sheets you were given:INCENTIVE SPIROMETER _____________________________________________________________________             [facility] - Preparing for Surgery Before surgery, you can play an important role.  Because skin is not sterile, your skin needs to be as free of germs as possible.  You can reduce the number of germs on your skin by washing with CHG (chlorahexidine gluconate) soap before surgery.  CHG is an antiseptic cleaner which kills germs and bonds with the skin to continue killing germs even after washing. Please DO NOT use if you have an allergy to CHG or antibacterial  soaps.  If your skin becomes reddened/irritated stop using the CHG and inform your nurse when you arrive at Short Stay. Do not shave (including legs and underarms) for at least 48 hours prior to the first CHG shower.  You may shave your face/neck. Please follow these instructions carefully:  1.  Shower with CHG Soap the night before surgery and the  morning of Surgery.  2.  If you choose to wash your hair, wash your hair first as usual with your  normal  shampoo.  3.  After you shampoo, rinse your hair and body thoroughly to remove the  shampoo.                           4.  Use CHG as you  would any other liquid soap.  You can apply chg directly  to the skin and wash                       Gently with a scrungie or clean washcloth.  5.  Apply the CHG Soap to your body ONLY FROM THE NECK DOWN.   Do not use on face/ open                           Wound or open sores. Avoid contact with eyes, ears mouth and genitals (private parts).                       Wash face,  Genitals (private parts) with your normal soap.             6.  Wash thoroughly, paying special attention to the area where your surgery  will be performed.  7.  Thoroughly rinse your body with warm water from the neck down.  8.  DO NOT shower/wash with your normal soap after using and rinsing off  the CHG Soap.                9.  Pat yourself dry with a clean towel.            10.  Wear clean pajamas.            11.  Place clean sheets on your bed the night of your first shower and do not  sleep with pets. Day of Surgery : Do not apply any lotions/deodorants the morning of surgery.  Please wear clean clothes to the hospital/surgery center.  FAILURE TO FOLLOW THESE INSTRUCTIONS MAY RESULT IN THE CANCELLATION OF YOUR SURGERY PATIENT SIGNATURE_________________________________  NURSE SIGNATURE__________________________________  ________________________________________________________________________    CLEAR LIQUID DIET   Foods  Allowed                                                                     Foods Excluded  Coffee and tea, regular and decaf                             liquids that you cannot  Plain Jell-O in any flavor                                             see through such as: Fruit ices (not with fruit pulp)                                     milk, soups, orange juice  Iced Popsicles                                    All solid food Carbonated beverages, regular and diet  Cranberry, grape and apple juices Sports drinks like Gatorade Lightly seasoned clear broth or consume(fat free) Sugar, honey syrup  Sample Menu Breakfast                                Lunch                                     Supper Cranberry juice                    Beef broth                            Chicken broth Jell-O                                     Grape juice                           Apple juice Coffee or tea                        Jell-O                                      Popsicle                                                Coffee or tea                        Coffee or tea  _____________________________________________________________________    Incentive Spirometer  An incentive spirometer is a tool that can help keep your lungs clear and active. This tool measures how well you are filling your lungs with each breath. Taking long deep breaths may help reverse or decrease the chance of developing breathing (pulmonary) problems (especially infection) following:  A long period of time when you are unable to move or be active. BEFORE THE PROCEDURE   If the spirometer includes an indicator to show your best effort, your nurse or respiratory therapist will set it to a desired goal.  If possible, sit up straight or lean slightly forward. Try not to slouch.  Hold the incentive spirometer in an upright position. INSTRUCTIONS FOR USE  1. Sit on the edge of your bed if  possible, or sit up as far as you can in bed or on a chair. 2. Hold the incentive spirometer in an upright position. 3. Breathe out normally. 4. Place the mouthpiece in your mouth and seal your lips tightly around it. 5. Breathe in slowly and as deeply as possible, raising the piston or the ball toward the top of the column. 6. Hold your breath for 3-5 seconds or for as long as possible. Allow the piston or ball to fall to the bottom of the column. 7. Remove the mouthpiece from your mouth and breathe out normally. 8. Rest for a few seconds and repeat Steps 1 through 7 at  least 10 times every 1-2 hours when you are awake. Take your time and take a few normal breaths between deep breaths. 9. The spirometer may include an indicator to show your best effort. Use the indicator as a goal to work toward during each repetition. 10. After each set of 10 deep breaths, practice coughing to be sure your lungs are clear. If you have an incision (the cut made at the time of surgery), support your incision when coughing by placing a pillow or rolled up towels firmly against it. Once you are able to get out of bed, walk around indoors and cough well. You may stop using the incentive spirometer when instructed by your caregiver.  RISKS AND COMPLICATIONS  Take your time so you do not get dizzy or light-headed.  If you are in pain, you may need to take or ask for pain medication before doing incentive spirometry. It is harder to take a deep breath if you are having pain. AFTER USE  Rest and breathe slowly and easily.  It can be helpful to keep track of a log of your progress. Your caregiver can provide you with a simple table to help with this. If you are using the spirometer at home, follow these instructions: [place] IF:   You are having difficultly using the spirometer.  You have trouble using the spirometer as often as instructed.  Your pain medication is not giving enough relief while using  the spirometer.  You develop fever of 100.5 F (38.1 C) or higher. SEEK IMMEDIATE MEDICAL CARE IF:   You cough up bloody sputum that had not been present before.  You develop fever of 102 F (38.9 C) or greater.  You develop worsening pain at or near the incision site. MAKE SURE YOU:   Understand these instructions.  Will watch your condition.  Will get help right away if you are not doing well or get worse. Document Released: 03/07/2007 Document Revised: 01/17/2012 Document Reviewed: 05/08/2007 [facility] Patient Information 2014 [place], [place].   ________________________________________________________________________

## 2015-04-22 NOTE — Progress Notes (Signed)
OV note per epic 03/20/2015 per Dr Diona Browner  Clearance noted per Dr Diona Browner in note section / epic 04/03/2015  EKG epic 03/20/2015  ECHO 09/14/2013 study discussed in 03/20/2015 note  NM Myocar Multi w/spect w/wall motion / EF 04/02/2015 per chart

## 2015-04-25 ENCOUNTER — Ambulatory Visit (HOSPITAL_COMMUNITY): Payer: Medicaid Other | Admitting: Anesthesiology

## 2015-04-25 ENCOUNTER — Ambulatory Visit (HOSPITAL_COMMUNITY): Payer: Medicaid Other

## 2015-04-25 ENCOUNTER — Encounter (HOSPITAL_COMMUNITY): Payer: Self-pay | Admitting: *Deleted

## 2015-04-25 ENCOUNTER — Encounter (HOSPITAL_COMMUNITY)
Admission: RE | Disposition: A | Discharge status: Home / Self Care | Payer: Self-pay | Source: Ambulatory Visit | Attending: Orthopedic Surgery

## 2015-04-25 ENCOUNTER — Observation Stay (HOSPITAL_COMMUNITY)
Admission: RE | Admit: 2015-04-25 | Discharge: 2015-04-26 | Disposition: A | Discharge status: Home / Self Care | Payer: Medicaid Other | Source: Ambulatory Visit | Attending: Orthopedic Surgery | Admitting: Orthopedic Surgery

## 2015-04-25 DIAGNOSIS — E785 Hyperlipidemia, unspecified: Secondary | ICD-10-CM | POA: Insufficient documentation

## 2015-04-25 DIAGNOSIS — M545 Low back pain, unspecified: Secondary | ICD-10-CM

## 2015-04-25 DIAGNOSIS — Z7982 Long term (current) use of aspirin: Secondary | ICD-10-CM | POA: Diagnosis not present

## 2015-04-25 DIAGNOSIS — Z885 Allergy status to narcotic agent status: Secondary | ICD-10-CM | POA: Insufficient documentation

## 2015-04-25 DIAGNOSIS — M48062 Spinal stenosis, lumbar region with neurogenic claudication: Secondary | ICD-10-CM | POA: Diagnosis present

## 2015-04-25 DIAGNOSIS — M4806 Spinal stenosis, lumbar region: Secondary | ICD-10-CM | POA: Diagnosis present

## 2015-04-25 DIAGNOSIS — E119 Type 2 diabetes mellitus without complications: Secondary | ICD-10-CM | POA: Diagnosis not present

## 2015-04-25 DIAGNOSIS — K219 Gastro-esophageal reflux disease without esophagitis: Secondary | ICD-10-CM | POA: Insufficient documentation

## 2015-04-25 DIAGNOSIS — Z79899 Other long term (current) drug therapy: Secondary | ICD-10-CM | POA: Insufficient documentation

## 2015-04-25 DIAGNOSIS — N183 Chronic kidney disease, stage 3 (moderate): Secondary | ICD-10-CM | POA: Insufficient documentation

## 2015-04-25 DIAGNOSIS — I129 Hypertensive chronic kidney disease with stage 1 through stage 4 chronic kidney disease, or unspecified chronic kidney disease: Secondary | ICD-10-CM | POA: Diagnosis not present

## 2015-04-25 DIAGNOSIS — Z6841 Body Mass Index (BMI) 40.0 and over, adult: Secondary | ICD-10-CM | POA: Insufficient documentation

## 2015-04-25 DIAGNOSIS — R32 Unspecified urinary incontinence: Secondary | ICD-10-CM | POA: Diagnosis not present

## 2015-04-25 DIAGNOSIS — G4733 Obstructive sleep apnea (adult) (pediatric): Secondary | ICD-10-CM | POA: Diagnosis not present

## 2015-04-25 DIAGNOSIS — G8929 Other chronic pain: Secondary | ICD-10-CM | POA: Diagnosis not present

## 2015-04-25 DIAGNOSIS — I2510 Atherosclerotic heart disease of native coronary artery without angina pectoris: Secondary | ICD-10-CM | POA: Insufficient documentation

## 2015-04-25 DIAGNOSIS — F17210 Nicotine dependence, cigarettes, uncomplicated: Secondary | ICD-10-CM | POA: Insufficient documentation

## 2015-04-25 HISTORY — PX: LUMBAR LAMINECTOMY/DECOMPRESSION MICRODISCECTOMY: SHX5026

## 2015-04-25 LAB — GLUCOSE, CAPILLARY
Glucose-Capillary: 117 mg/dL — ABNORMAL HIGH (ref 65–99)
Glucose-Capillary: 130 mg/dL — ABNORMAL HIGH (ref 65–99)
Glucose-Capillary: 162 mg/dL — ABNORMAL HIGH (ref 65–99)
Glucose-Capillary: 177 mg/dL — ABNORMAL HIGH (ref 65–99)

## 2015-04-25 SURGERY — LUMBAR LAMINECTOMY/DECOMPRESSION MICRODISCECTOMY 1 LEVEL
Anesthesia: General

## 2015-04-25 MED ORDER — NEOSTIGMINE METHYLSULFATE 10 MG/10ML IV SOLN
INTRAVENOUS | Status: DC | PRN
  Administered 2015-04-25: 4 mg via INTRAVENOUS

## 2015-04-25 MED ORDER — ROCURONIUM BROMIDE 100 MG/10ML IV SOLN
INTRAVENOUS | Status: AC
  Filled 2015-04-25: qty 1

## 2015-04-25 MED ORDER — MENTHOL 3 MG MT LOZG: 1.0000 | LOZENGE | OROMUCOSAL | Status: DC | PRN

## 2015-04-25 MED ORDER — GLYCOPYRROLATE 0.2 MG/ML IJ SOLN
INTRAMUSCULAR | Status: DC | PRN
  Administered 2015-04-25: 0.6 mg via INTRAVENOUS

## 2015-04-25 MED ORDER — ACETAMINOPHEN 650 MG RE SUPP: 650.0000 mg | RECTAL | Status: DC | PRN

## 2015-04-25 MED ORDER — ONDANSETRON HCL 4 MG/2ML IJ SOLN
INTRAMUSCULAR | Status: DC | PRN
  Administered 2015-04-25: 4 mg via INTRAVENOUS

## 2015-04-25 MED ORDER — GLYCOPYRROLATE 0.2 MG/ML IJ SOLN
INTRAMUSCULAR | Status: AC
  Filled 2015-04-25: qty 3

## 2015-04-25 MED ORDER — OXYCODONE-ACETAMINOPHEN 5-325 MG PO TABS
1.0000 | ORAL_TABLET | ORAL | Status: DC | PRN
  Administered 2015-04-25 – 2015-04-26 (×5): 1 via ORAL
  Filled 2015-04-25 (×5): qty 1

## 2015-04-25 MED ORDER — MEPERIDINE HCL 50 MG/ML IJ SOLN: 6.2500 mg | INTRAMUSCULAR | Status: DC | PRN

## 2015-04-25 MED ORDER — CHLORHEXIDINE GLUCONATE 4 % EX LIQD: 60.0000 mL | Freq: Once | CUTANEOUS | Status: DC

## 2015-04-25 MED ORDER — MIDAZOLAM HCL 2 MG/2ML IJ SOLN: 0.5000 mg | Freq: Once | INTRAMUSCULAR | Status: DC | PRN

## 2015-04-25 MED ORDER — MIDAZOLAM HCL 5 MG/5ML IJ SOLN
INTRAMUSCULAR | Status: DC | PRN
  Administered 2015-04-25: 2 mg via INTRAVENOUS

## 2015-04-25 MED ORDER — FENTANYL CITRATE (PF) 100 MCG/2ML IJ SOLN
INTRAMUSCULAR | Status: AC
  Filled 2015-04-25: qty 2

## 2015-04-25 MED ORDER — HYDROMORPHONE HCL 1 MG/ML IJ SOLN
INTRAMUSCULAR | Status: AC
  Administered 2015-04-25: 1 mg via INTRAVENOUS
  Filled 2015-04-25: qty 1

## 2015-04-25 MED ORDER — DEXAMETHASONE SODIUM PHOSPHATE 10 MG/ML IJ SOLN
INTRAMUSCULAR | Status: DC | PRN
  Administered 2015-04-25: 10 mg via INTRAVENOUS

## 2015-04-25 MED ORDER — BACITRACIN-NEOMYCIN-POLYMYXIN 400-5-5000 EX OINT
TOPICAL_OINTMENT | CUTANEOUS | Status: DC | PRN
  Administered 2015-04-25: 1 via TOPICAL

## 2015-04-25 MED ORDER — LACTATED RINGERS IV SOLN
INTRAVENOUS | Status: DC
  Administered 2015-04-26: 01:00:00 via INTRAVENOUS

## 2015-04-25 MED ORDER — GABAPENTIN 300 MG PO CAPS
300.0000 mg | ORAL_CAPSULE | Freq: Three times a day (TID) | ORAL | Status: DC
  Administered 2015-04-25 – 2015-04-26 (×2): 300 mg via ORAL
  Filled 2015-04-25 (×4): qty 1

## 2015-04-25 MED ORDER — ROCURONIUM BROMIDE 100 MG/10ML IV SOLN
INTRAVENOUS | Status: DC | PRN
  Administered 2015-04-25: 10 mg via INTRAVENOUS
  Administered 2015-04-25: 40 mg via INTRAVENOUS

## 2015-04-25 MED ORDER — DEXAMETHASONE SODIUM PHOSPHATE 10 MG/ML IJ SOLN
INTRAMUSCULAR | Status: AC
Start: 2015-04-25 — End: 2015-04-25
  Filled 2015-04-25: qty 1

## 2015-04-25 MED ORDER — INSULIN ASPART 100 UNIT/ML ~~LOC~~ SOLN
0.0000 [IU] | Freq: Three times a day (TID) | SUBCUTANEOUS | Status: DC
  Administered 2015-04-25: 4 [IU] via SUBCUTANEOUS
  Administered 2015-04-26: 3 [IU] via SUBCUTANEOUS

## 2015-04-25 MED ORDER — HYDROMORPHONE HCL 1 MG/ML IJ SOLN
0.5000 mg | INTRAMUSCULAR | Status: DC | PRN
  Administered 2015-04-25 – 2015-04-26 (×5): 1 mg via INTRAVENOUS
  Filled 2015-04-25 (×5): qty 1

## 2015-04-25 MED ORDER — PROPOFOL 10 MG/ML IV BOLUS
INTRAVENOUS | Status: AC
  Filled 2015-04-25: qty 20

## 2015-04-25 MED ORDER — BACITRACIN-NEOMYCIN-POLYMYXIN 400-5-5000 EX OINT
TOPICAL_OINTMENT | CUTANEOUS | Status: AC
  Filled 2015-04-25: qty 1

## 2015-04-25 MED ORDER — NEOSTIGMINE METHYLSULFATE 10 MG/10ML IV SOLN
INTRAVENOUS | Status: AC
  Filled 2015-04-25: qty 1

## 2015-04-25 MED ORDER — THROMBIN 5000 UNITS EX SOLR
CUTANEOUS | Status: AC
  Filled 2015-04-25: qty 10000

## 2015-04-25 MED ORDER — METHOCARBAMOL 500 MG PO TABS
500.0000 mg | ORAL_TABLET | Freq: Four times a day (QID) | ORAL | Status: DC | PRN
  Administered 2015-04-26 (×2): 500 mg via ORAL
  Filled 2015-04-25 (×2): qty 1

## 2015-04-25 MED ORDER — CEFAZOLIN SODIUM 1-5 GM-% IV SOLN
1.0000 g | Freq: Three times a day (TID) | INTRAVENOUS | Status: DC
  Administered 2015-04-25 – 2015-04-26 (×2): 1 g via INTRAVENOUS
  Filled 2015-04-25 (×3): qty 50

## 2015-04-25 MED ORDER — BUPIVACAINE-EPINEPHRINE (PF) 0.5% -1:200000 IJ SOLN
INTRAMUSCULAR | Status: AC
  Filled 2015-04-25: qty 30

## 2015-04-25 MED ORDER — FLEET ENEMA 7-19 GM/118ML RE ENEM: 1.0000 | ENEMA | Freq: Once | RECTAL | Status: AC | PRN

## 2015-04-25 MED ORDER — POLYETHYLENE GLYCOL 3350 17 G PO PACK: 17.0000 g | PACK | Freq: Every day | ORAL | Status: DC | PRN

## 2015-04-25 MED ORDER — HYDROMORPHONE HCL 1 MG/ML IJ SOLN
0.2500 mg | INTRAMUSCULAR | Status: DC | PRN
  Administered 2015-04-25 (×4): 0.5 mg via INTRAVENOUS

## 2015-04-25 MED ORDER — METHOCARBAMOL 1000 MG/10ML IJ SOLN
500.0000 mg | Freq: Four times a day (QID) | INTRAMUSCULAR | Status: DC | PRN
  Administered 2015-04-25: 500 mg via INTRAVENOUS
  Filled 2015-04-25 (×2): qty 5

## 2015-04-25 MED ORDER — SODIUM CHLORIDE 0.9 % IR SOLN
Status: AC
  Filled 2015-04-25: qty 1

## 2015-04-25 MED ORDER — BUPIVACAINE LIPOSOME 1.3 % IJ SUSP
20.0000 mL | Freq: Once | INTRAMUSCULAR | Status: AC
  Administered 2015-04-25: 20 mL
  Filled 2015-04-25: qty 20

## 2015-04-25 MED ORDER — PHENOL 1.4 % MT LIQD
1.0000 | OROMUCOSAL | Status: DC | PRN
  Filled 2015-04-25: qty 177

## 2015-04-25 MED ORDER — BISACODYL 5 MG PO TBEC: 5.0000 mg | DELAYED_RELEASE_TABLET | Freq: Every day | ORAL | Status: DC | PRN

## 2015-04-25 MED ORDER — OXYCODONE HCL 5 MG PO TABS
5.0000 mg | ORAL_TABLET | ORAL | Status: DC | PRN
  Administered 2015-04-25 – 2015-04-26 (×5): 5 mg via ORAL
  Filled 2015-04-25 (×5): qty 1

## 2015-04-25 MED ORDER — LACTATED RINGERS IV SOLN
INTRAVENOUS | Status: DC
  Administered 2015-04-25: 1000 mL via INTRAVENOUS

## 2015-04-25 MED ORDER — OXYCODONE-ACETAMINOPHEN 10-325 MG PO TABS: 1.0000 | ORAL_TABLET | ORAL | Status: DC | PRN

## 2015-04-25 MED ORDER — ONDANSETRON HCL 4 MG/2ML IJ SOLN: 4.0000 mg | INTRAMUSCULAR | Status: DC | PRN

## 2015-04-25 MED ORDER — ACETAMINOPHEN 325 MG PO TABS: 650.0000 mg | ORAL_TABLET | ORAL | Status: DC | PRN

## 2015-04-25 MED ORDER — ROSUVASTATIN CALCIUM 20 MG PO TABS
20.0000 mg | ORAL_TABLET | Freq: Every day | ORAL | Status: DC
  Administered 2015-04-25: 20 mg via ORAL
  Filled 2015-04-25 (×2): qty 1

## 2015-04-25 MED ORDER — LIDOCAINE HCL (PF) 2 % IJ SOLN
INTRAMUSCULAR | Status: DC | PRN
  Administered 2015-04-25: 30 mg via INTRADERMAL

## 2015-04-25 MED ORDER — METFORMIN HCL 500 MG PO TABS
500.0000 mg | ORAL_TABLET | Freq: Every day | ORAL | Status: DC
  Administered 2015-04-26: 500 mg via ORAL
  Filled 2015-04-25 (×2): qty 1

## 2015-04-25 MED ORDER — MIDAZOLAM HCL 2 MG/2ML IJ SOLN
INTRAMUSCULAR | Status: AC
  Filled 2015-04-25: qty 2

## 2015-04-25 MED ORDER — FUROSEMIDE 80 MG PO TABS
80.0000 mg | ORAL_TABLET | Freq: Two times a day (BID) | ORAL | Status: DC
  Administered 2015-04-25 – 2015-04-26 (×2): 80 mg via ORAL
  Filled 2015-04-25 (×4): qty 1

## 2015-04-25 MED ORDER — FENTANYL CITRATE (PF) 100 MCG/2ML IJ SOLN
INTRAMUSCULAR | Status: DC | PRN
Start: 2015-04-25 — End: 2015-04-25
  Administered 2015-04-25 (×4): 50 ug via INTRAVENOUS

## 2015-04-25 MED ORDER — HYDROCODONE-ACETAMINOPHEN 5-325 MG PO TABS: 1.0000 | ORAL_TABLET | ORAL | Status: DC | PRN

## 2015-04-25 MED ORDER — PROPOFOL 10 MG/ML IV BOLUS
INTRAVENOUS | Status: DC | PRN
  Administered 2015-04-25: 130 mg via INTRAVENOUS

## 2015-04-25 MED ORDER — PANTOPRAZOLE SODIUM 40 MG PO TBEC
40.0000 mg | DELAYED_RELEASE_TABLET | Freq: Every day | ORAL | Status: DC
  Administered 2015-04-25 – 2015-04-26 (×2): 40 mg via ORAL
  Filled 2015-04-25 (×2): qty 1

## 2015-04-25 MED ORDER — DEXTROSE 5 % IV SOLN
3.0000 g | INTRAVENOUS | Status: AC
  Administered 2015-04-25: 3 g via INTRAVENOUS
  Filled 2015-04-25: qty 3000

## 2015-04-25 MED ORDER — SUCCINYLCHOLINE CHLORIDE 20 MG/ML IJ SOLN
INTRAMUSCULAR | Status: DC | PRN
  Administered 2015-04-25: 100 mg via INTRAVENOUS

## 2015-04-25 MED ORDER — HYDROMORPHONE HCL 1 MG/ML IJ SOLN
INTRAMUSCULAR | Status: AC
  Filled 2015-04-25: qty 1

## 2015-04-25 MED ORDER — PROMETHAZINE HCL 25 MG/ML IJ SOLN: 6.2500 mg | INTRAMUSCULAR | Status: DC | PRN

## 2015-04-25 MED ORDER — ONDANSETRON HCL 4 MG/2ML IJ SOLN
INTRAMUSCULAR | Status: AC
  Filled 2015-04-25: qty 2

## 2015-04-25 MED ORDER — THROMBIN 5000 UNITS EX SOLR
OROMUCOSAL | Status: DC | PRN
  Administered 2015-04-25: 15:00:00 via TOPICAL

## 2015-04-25 MED ORDER — BUPIVACAINE-EPINEPHRINE 0.5% -1:200000 IJ SOLN
INTRAMUSCULAR | Status: DC | PRN
  Administered 2015-04-25: 20 mL

## 2015-04-25 MED ORDER — SODIUM CHLORIDE 0.9 % IR SOLN
Status: DC | PRN
  Administered 2015-04-25: 500 mL

## 2015-04-25 SURGICAL SUPPLY — 37 items
BAG ZIPLOCK 12X15 (MISCELLANEOUS) ×2 IMPLANT
CLEANER TIP ELECTROSURG 2X2 (MISCELLANEOUS) ×2 IMPLANT
DRAPE MICROSCOPE LEICA (MISCELLANEOUS) ×2 IMPLANT
DRAPE POUCH INSTRU U-SHP 10X18 (DRAPES) ×2 IMPLANT
DRAPE SHEET LG 3/4 BI-LAMINATE (DRAPES) ×2 IMPLANT
DRAPE SURG 17X11 SM STRL (DRAPES) ×2 IMPLANT
DRSG ADAPTIC 3X8 NADH LF (GAUZE/BANDAGES/DRESSINGS) ×2 IMPLANT
DRSG PAD ABDOMINAL 8X10 ST (GAUZE/BANDAGES/DRESSINGS) ×4 IMPLANT
DURAPREP 26ML APPLICATOR (WOUND CARE) ×2 IMPLANT
ELECT BLADE TIP CTD 4 INCH (ELECTRODE) ×2 IMPLANT
ELECT REM PT RETURN 9FT ADLT (ELECTROSURGICAL) ×2
ELECTRODE REM PT RTRN 9FT ADLT (ELECTROSURGICAL) ×1 IMPLANT
GAUZE SPONGE 4X4 12PLY STRL (GAUZE/BANDAGES/DRESSINGS) ×2 IMPLANT
GLOVE BIOGEL PI IND STRL 8 (GLOVE) ×1 IMPLANT
GLOVE BIOGEL PI INDICATOR 8 (GLOVE) ×1
GLOVE ECLIPSE 8.0 STRL XLNG CF (GLOVE) ×2 IMPLANT
GOWN STRL REUS W/TWL XL LVL3 (GOWN DISPOSABLE) ×4 IMPLANT
KIT BASIN OR (CUSTOM PROCEDURE TRAY) ×2 IMPLANT
KIT POSITIONING SURG ANDREWS (MISCELLANEOUS) ×2 IMPLANT
MANIFOLD NEPTUNE II (INSTRUMENTS) ×2 IMPLANT
NEEDLE HYPO 22GX1.5 SAFETY (NEEDLE) ×2 IMPLANT
NEEDLE SPNL 18GX3.5 QUINCKE PK (NEEDLE) ×4 IMPLANT
PACK LAMINECTOMY ORTHO (CUSTOM PROCEDURE TRAY) ×2 IMPLANT
PAD ABD 8X10 STRL (GAUZE/BANDAGES/DRESSINGS) ×2 IMPLANT
PATTIES SURGICAL .5 X.5 (GAUZE/BANDAGES/DRESSINGS) ×2 IMPLANT
PATTIES SURGICAL .75X.75 (GAUZE/BANDAGES/DRESSINGS) IMPLANT
PATTIES SURGICAL 1X1 (DISPOSABLE) IMPLANT
PEN SKIN MARKING BROAD (MISCELLANEOUS) ×2 IMPLANT
SPONGE LAP 4X18 X RAY DECT (DISPOSABLE) ×6 IMPLANT
SPONGE SURGIFOAM ABS GEL 100 (HEMOSTASIS) ×2 IMPLANT
STAPLER VISISTAT 35W (STAPLE) ×2 IMPLANT
SUT VIC AB 0 CT1 27 (SUTURE) ×1
SUT VIC AB 0 CT1 27XBRD ANTBC (SUTURE) ×1 IMPLANT
SUT VIC AB 1 CT1 27 (SUTURE) ×3
SUT VIC AB 1 CT1 27XBRD ANTBC (SUTURE) ×3 IMPLANT
SYR 20CC LL (SYRINGE) ×2 IMPLANT
TOWEL OR 17X26 10 PK STRL BLUE (TOWEL DISPOSABLE) ×4 IMPLANT

## 2015-04-25 NOTE — Anesthesia Procedure Notes (Signed)
Procedure Name: Intubation Date/Time: 04/25/2015 1:03 PM Performed by: Early Osmond E Pre-anesthesia Checklist: Patient identified, Emergency Drugs available, Suction available and Patient being monitored Patient Re-evaluated:Patient Re-evaluated prior to inductionOxygen Delivery Method: Circle System Utilized Preoxygenation: Pre-oxygenation with 100% oxygen Intubation Type: IV induction Ventilation: Mask ventilation without difficulty Laryngoscope Size: Mac and 4 Grade View: Grade III Tube type: Oral Tube size: 7.0 mm Number of attempts: 1 Airway Equipment and Method: Stylet Placement Confirmation: ETT inserted through vocal cords under direct vision,  positive ETCO2 and breath sounds checked- equal and bilateral Secured at: 21 cm Tube secured with: Tape Dental Injury: Teeth and Oropharynx as per pre-operative assessment

## 2015-04-25 NOTE — Progress Notes (Signed)
Pt has refused CPAP for the night, RT to monitor and assess as needed.  

## 2015-04-25 NOTE — Brief Op Note (Signed)
04/25/2015  3:20 PM  PATIENT:  [name]  54 y.o. female  PRE-OPERATIVE DIAGNOSIS:  SPINAL STENOSIS L-4-L-5 and Foraminal Stenosis for the L-4 andL-5 Nerve  Roots Bilaterally. Morbid Obesity  POST-OPERATIVE DIAGNOSIS:  Same as Pre-Op  PROCEDURE:  Procedure(s): CENTRAL LUMBAR /DECOMPRESSION L4-L5 (N/A) for Spinal Stenosis and Foraminotomies for the L-4 and L-5 Nerve Roots Bilaterally  SURGEON:  Surgeon(s) and Role:    * Ranee Gosselin, MD - Primary    * Drucilla Schmidt, MD - Assisting    ASSISTANTS:James Aplington MD  ANESTHESIA:   general  EBL:  Total I/O In: -  Out: 300 [Urine:200; Blood:100]  BLOOD ADMINISTERED:none  DRAINS: none   LOCAL MEDICATIONS USED:  MARCAINE  20cc of 0.50% with Epinephrine at the start of the cas and 20cc of Exparel at the end of the case.  SPECIMEN:  No Specimen  DISPOSITION OF SPECIMEN:  N/A  COUNTS:  YES  TOURNIQUET:  * No tourniquets in log *  DICTATION: .Other Dictation: Dictation Number [id number]  PLAN OF CARE: Admit for overnight observation  PATIENT DISPOSITION:  PACU - hemodynamically stable.   Delay start of Pharmacological VTE agent (>24hrs) due to surgical blood loss or risk of bleeding: yes

## 2015-04-25 NOTE — Anesthesia Postprocedure Evaluation (Signed)
  Anesthesia Post-op Note  Patient: [name]  Procedure(s) Performed: Procedure(s): CENTRAL LUMBAR /DECOMPRESSION L4-L5 (N/A)  Patient Location: PACU  Anesthesia Type:General  Level of Consciousness: awake, alert , oriented and patient cooperative  Airway and Oxygen Therapy: Patient Spontanous Breathing and Patient connected to nasal cannula oxygen  Post-op Pain: mild  Post-op Assessment: Post-op Vital signs reviewed, Patient's Cardiovascular Status Stable, Respiratory Function Stable, Patent Airway, No signs of Nausea or vomiting and Pain level controlled LLE Motor Response: Purposeful movement LLE Sensation: Full sensation RLE Motor Response: Purposeful movement RLE Sensation: Full sensation      Post-op Vital Signs: Reviewed and stable  Last Vitals:  Filed Vitals:   04/25/15 1849  BP: 109/60  Pulse: 70  Temp: 36.4 C  Resp: 14    Complications: No apparent anesthesia complications

## 2015-04-25 NOTE — H&P (Signed)
[name] is an 54 y.o. female.   Chief Complaint: Pain in both Lowers. HPI: Progressive pain and weakness in both lowers.  Past Medical History  Diagnosis Date  . GERD (gastroesophageal reflux disease)   . Hyperlipidemia   . Noncompliance   . CAD (coronary artery disease)     BMS to circumflex 2007 - Dr. Juanda Chance  . Morbid obesity   . Type 2 diabetes mellitus   . Essential hypertension   . HA (headache)   . Left-sided face pain   . OSA (obstructive sleep apnea)     uses CPAP; pt states also uses 4L/M O2 during the daytime if needed; pt also states O2 is also bled into CPAP at night   . Shortness of breath dyspnea     exertion  . Chronic kidney disease     stage 3  . Urinary incontinence   . Arthritis   . Cancer     pt states had cancer in past; was treated through hysterectomy   . Falls frequently     Past Surgical History  Procedure Laterality Date  . Cholecystectomy    . Abdominal hysterectomy    . Colonoscopy N/A 04/10/2013    Procedure: COLONOSCOPY;  Surgeon: Malissa Hippo, MD;  Location: [facility];  Service: Endoscopy;  Laterality: N/A;  . Eye surgery      past cataract surgery bilat   . Cardiac catheterization    . Tubal ligation    . Hernia repair      abdominal   . Breast surgery      biopsy per right breast; pt states has silver piece in for marking     Family History  Problem Relation Age of Onset  . Coronary artery disease Father   . Cancer - Colon Father   . Breast cancer Mother   . Diabetes Father   . High Cholesterol Father    Social History:  reports that she has been smoking Cigarettes.  She has a 17 pack-year smoking history. She has never used smokeless tobacco. She reports that she does not drink alcohol or use illicit drugs.  Allergies:  Allergies  Allergen Reactions  . Morphine And Related Nausea And Vomiting    Medications Prior to Admission  Medication Sig Dispense Refill  . aspirin EC 325 MG tablet Take 325 mg by mouth  every morning.     . furosemide (LASIX) 80 MG tablet Take 1 tablet (80 mg total) by mouth 2 (two) times daily. 60 tablet 3  . gabapentin (NEURONTIN) 300 MG capsule TAKE ONE CAPSULE BY MOUTH THREE TIMES DAILY 90 capsule 2  . glucose blood (ACCU-CHEK AVIVA PLUS) test strip Use aTest blood sugar once daily 100 each 2  . metFORMIN (GLUCOPHAGE) 500 MG tablet Take 500 mg by mouth every morning.     Marland Kitchen omeprazole (PRILOSEC) 40 MG capsule Take 1 capsule (40 mg total) by mouth daily. (Patient taking differently: Take 40 mg by mouth daily at 3 pm. ) 30 capsule 5  . oxyCODONE-acetaminophen (PERCOCET) 10-325 MG per tablet Take 1 tablet by mouth every 4 (four) hours as needed for pain.    . rosuvastatin (CRESTOR) 20 MG tablet Take 1 tablet (20 mg total) by mouth at bedtime. 30 tablet 5  . traMADol (ULTRAM) 50 MG tablet TAKE ONE TABLET BY MOUTH EVERY 12 HOURS AS NEEDED (Patient taking differently: TAKE ONE TABLET BY MOUTH EVERY 12 HOURS AS NEEDED for pain) 60 tablet 0  Results for orders placed or performed during the hospital encounter of 04/25/15 (from the past 48 hour(s))  Glucose, capillary     Status: Abnormal   Collection Time: 04/25/15 10:29 AM  Result Value Ref Range   Glucose-Capillary 117 (H) 65 - 99 mg/dL   Comment 1 Notify RN    No results found.  Review of Systems  Constitutional: Negative.   HENT: Negative.   Eyes: Negative.   Cardiovascular: Negative.   Musculoskeletal: Positive for back pain.  Skin: Negative.   Neurological: Negative.   Endo/Heme/Allergies: Negative.   Psychiatric/Behavioral: Negative.     Blood pressure 137/71, pulse 78, temperature 98.6 F (37 C), temperature source Oral, resp. rate 16, height 5\' 2"  (1.575 m), weight 139.708 kg (308 lb), SpO2 98 %. Physical Exam  Constitutional: She appears well-developed.  HENT:  Head: Normocephalic.  Eyes: Pupils are equal, round, and reactive to light.  Neck: Normal range of motion.  Cardiovascular: Normal rate.    Respiratory: Effort normal.  GI: Soft.  Musculoskeletal:  Weakness of both lowers.  Neurological:  Weakness of both lower Extremities  Skin: Skin is warm.  Psychiatric: She has a normal mood and affect.     Assessment/Plan Central Decompressive lumbar Laminectomy at L-4-L-5  Dean Goldner A 04/25/2015, 12:55 PM

## 2015-04-25 NOTE — Anesthesia Preprocedure Evaluation (Addendum)
Anesthesia Evaluation  Patient identified by MRN, date of birth, ID band Patient awake    Reviewed: Allergy & Precautions, NPO status   Airway Mallampati: II  TM Distance: <3 FB Neck ROM: Full    Dental  (+) Missing, Poor Dentition, Dental Advisory Given   Pulmonary shortness of breath, sleep apnea , Current Smoker,  breath sounds clear to auscultation        Cardiovascular hypertension, + CAD and + Cardiac Stents Rhythm:Regular Rate:Normal  Cardiology notes, studies noted within Epic   Neuro/Psych  Headaches,    GI/Hepatic GERD-  ,  Endo/Other  diabetesMorbid obesity  Renal/GU      Musculoskeletal  (+) Arthritis -,   Abdominal (+) + obese,   Peds  Hematology   Anesthesia Other Findings   Reproductive/Obstetrics                            Anesthesia Physical Anesthesia Plan  ASA: III  Anesthesia Plan: General   Post-op Pain Management:    Induction: Intravenous  Airway Management Planned: Oral ETT  Additional Equipment:   Intra-op Plan:   Post-operative Plan: Extubation in OR  Informed Consent: I have reviewed the patients History and Physical, chart, labs and discussed the procedure including the risks, benefits and alternatives for the proposed anesthesia with the patient or authorized representative who has indicated his/her understanding and acceptance.   Dental advisory given  Plan Discussed with: CRNA and Surgeon  Anesthesia Plan Comments:         Anesthesia Quick Evaluation

## 2015-04-25 NOTE — Progress Notes (Signed)
Pt placed on Auto CPAP 4-10 CMH20 with 2 LPM O2 bleed in via FFM.  Pt tolerating well at this time, RT to monitor and assess as needed.

## 2015-04-25 NOTE — Transfer of Care (Signed)
Immediate Anesthesia Transfer of Care Note  Patient: [name]  Procedure(s) Performed: Procedure(s): CENTRAL LUMBAR /DECOMPRESSION L4-L5 (N/A)  Patient Location: PACU  Anesthesia Type:General  Level of Consciousness:  sedated, patient cooperative and responds to stimulation  Airway & Oxygen Therapy:Patient Spontanous Breathing and Patient connected to face mask oxgen  Post-op Assessment:  Report given to PACU RN and Post -op Vital signs reviewed and stable  Post vital signs:  Reviewed and stable  Last Vitals:  Filed Vitals:   04/25/15 1025  BP: 137/71  Pulse: 78  Temp: 37 C  Resp: 16    Complications: No apparent anesthesia complications

## 2015-04-26 ENCOUNTER — Other Ambulatory Visit: Payer: Self-pay | Admitting: Nurse Practitioner

## 2015-04-26 DIAGNOSIS — M4806 Spinal stenosis, lumbar region: Secondary | ICD-10-CM | POA: Diagnosis not present

## 2015-04-26 LAB — GLUCOSE, CAPILLARY
Glucose-Capillary: 141 mg/dL — ABNORMAL HIGH (ref 65–99)
Glucose-Capillary: 157 mg/dL — ABNORMAL HIGH (ref 65–99)

## 2015-04-26 NOTE — Op Note (Signed)
[name], [name] NO.:  [id number]  MEDICAL RECORD NO.:  [id number]  LOCATION:  1617                         FACILITY:  [facility]  PHYSICIAN:  Georges Lynch. Grae Cannata, M.D.DATE OF BIRTH:  1961/04/06  DATE OF PROCEDURE:  04/25/2015 DATE OF DISCHARGE:                              OPERATIVE REPORT   SURGEON:  Georges Lynch. Darrelyn Hillock, M.D.  ASSISTANT:  Marlowe Kays, M.D.  PREOPERATIVE DIAGNOSES: 1. Severe spinal stenosis with bilateral leg pain and weakness,     stenosis for the L4-5. 2. Foraminal stenosis for the L4 and L5 roots bilaterally. 3. Grade 1.5 pseudospondylolisthesis at L4-L5.  POSTOPERATIVE DIAGNOSES: 1. Severe spinal stenosis with bilateral leg pain and weakness, the     stenosis with L4-5. 2. Foraminal stenosis for the L4 and L5 roots bilaterally. 3. Grade 1.5 pseudospondylolisthesis at L4-L5.  OPERATION: 1. Complete decompressive lumbar laminectomy at L4-L5, central. 2. Foraminotomies for the L4 and L5 roots bilaterally.  DESCRIPTION OF PROCEDURE:  Under general anesthesia, the patient on spinal frame, she weighed 308 pounds.  Great care was taken to position her on the frame.  The appropriate time-out was first carried out.  We did not need to mark the side of the back because it was a central decompression and she had bilateral leg pain.  She had 3 g of IV Ancef. After the time-out, sterile prepping and draping was carried out, two needles were placed in the back for localization purposes and an x-ray was taken.  At that particular time, after needles were placed in the back and x-ray was taken, an incision was made over the L4-5 interspace. I extended the incision proximally and distally.  At this time, the self- retaining retractors were inserted.  Prior to making the incision, I injected 20 mL of 0.5% Marcaine and epinephrine into the wound site. Once we went down, inserted the self-retaining retractors, we stripped the muscle from the lamina  and spinous processes bilaterally.  Two Kocher clamps were sent and placed over the spinous process.  Another x- ray was taken.  Following that, we then inserted our McCullough retractors.  We had good control of the bleeding.  We then removed the spinous process of L4, and went slightly above and below to remove parts of those spinous processes as well.  I went down then and removed the lamina as well.  The microscope was brought in for [place] to complete the decompression.  The dura was protected at all times.  I utilized a hockey-stick up above the dura to make sure that we did not get the dura with the rongeur.  We went out and did decompressions of the lateral recesses bilaterally.  We then went out distally and proximally and complete the decompression.  Two instruments were placed in the back for localization purposes again.  We went out and did foraminotomies bilaterally as well for the L4 and 5 roots.  We had a very nice decompression, this was a difficult case because of her morbid obesity. We thoroughly irrigated out the area and then x-ray was taken as I mentioned with the instruments in place.  We then loosely applied some thrombin-soaked Gelfoam.  I then closed the wound in layers in usual fashion.  I left a small distal and proximal part of the wound and opened for drainage purposes.  After I closed the muscle, I injected 20 mL of Exparel into the soft tissue. The subcu was closed with #1 Vicryl, the skin with metal staples and a sterile Neosporin dressing was applied.  She had 3 g of IV Ancef preop. She left the operative room in satisfactory condition.          ______________________________ Georges Lynch Darrelyn Hillock, M.D.     RAG/MEDQ  D:  04/25/2015  T:  04/26/2015  Job:  [id number]

## 2015-04-26 NOTE — Progress Notes (Signed)
   Subjective: 1 Day Post-Op Procedure(s) (LRB): CENTRAL LUMBAR /DECOMPRESSION L4-L5 (N/A)  Pt states she feels fine Minimal to no pain in the lumbar spine or legs Working with therapy well Patient reports pain as mild.  Objective:   VITALS:   Filed Vitals:   04/26/15 0500  BP: 110/56  Pulse: 53  Temp: 97.5 F (36.4 C)  Resp: 14    Lumbar incision healing well nv intact distally Good mobilization  LABS No results for input(s): HGB, HCT, WBC, PLT in the last 72 hours.  No results for input(s): NA, K, BUN, CREATININE, GLUCOSE in the last 72 hours.   Assessment/Plan: 1 Day Post-Op Procedure(s) (LRB): CENTRAL LUMBAR /DECOMPRESSION L4-L5 (N/A) D/c home today after therapy F/u in 2 weeks with Dr. Darrelyn Hillock Pain management as needed    Alphonsa Overall, MPAS, PA-C  04/26/2015, 8:42 AM

## 2015-04-26 NOTE — Discharge Summary (Signed)
Physician Discharge Summary   Patient ID: [name] MRN: [id number] DOB/AGE: 06-26-1961 54 y.o.  Admit date: 04/25/2015 Discharge date: 04/26/2015  Admission Diagnoses:  Active Problems:   Spinal stenosis, lumbar region, with neurogenic claudication   Discharge Diagnoses:  Same   Surgeries: Procedure(s): CENTRAL LUMBAR /DECOMPRESSION L4-L5 on 04/25/2015   Consultants: PT/OT  Discharged Condition: Stable  Hospital Course: [name] is an 54 y.o. female who was admitted 04/25/2015 with a chief complaint of No chief complaint on file. , and found to have a diagnosis of <principal problem not specified>.  They were brought to the operating room on 04/25/2015 and underwent the above named procedures.    The patient had an uncomplicated hospital course and was stable for discharge.  Recent vital signs:  Filed Vitals:   04/26/15 0500  BP: 110/56  Pulse: 53  Temp: 97.5 F (36.4 C)  Resp: 14    Recent laboratory studies:  Results for orders placed or performed during the hospital encounter of 04/25/15  Glucose, capillary  Result Value Ref Range   Glucose-Capillary 117 (H) 65 - 99 mg/dL   Comment 1 Notify RN   Glucose, capillary  Result Value Ref Range   Glucose-Capillary 130 (H) 65 - 99 mg/dL  Glucose, capillary  Result Value Ref Range   Glucose-Capillary 162 (H) 65 - 99 mg/dL  Glucose, capillary  Result Value Ref Range   Glucose-Capillary 177 (H) 65 - 99 mg/dL  Glucose, capillary  Result Value Ref Range   Glucose-Capillary 141 (H) 65 - 99 mg/dL    Discharge Medications:     Medication List    ASK your doctor about these medications        aspirin EC 325 MG tablet  Take 325 mg by mouth every morning.     furosemide 80 MG tablet  Commonly known as:  LASIX  Take 1 tablet (80 mg total) by mouth 2 (two) times daily.     gabapentin 300 MG capsule  Commonly known as:  NEURONTIN  TAKE ONE CAPSULE BY MOUTH THREE TIMES DAILY     glucose blood test strip    Commonly known as:  ACCU-CHEK AVIVA PLUS  Use aTest blood sugar once daily     metFORMIN 500 MG tablet  Commonly known as:  GLUCOPHAGE  Take 500 mg by mouth every morning.     omeprazole 40 MG capsule  Commonly known as:  PRILOSEC  Take 1 capsule (40 mg total) by mouth daily.     oxyCODONE-acetaminophen 10-325 MG per tablet  Commonly known as:  PERCOCET  Take 1 tablet by mouth every 4 (four) hours as needed for pain.     rosuvastatin 20 MG tablet  Commonly known as:  CRESTOR  Take 1 tablet (20 mg total) by mouth at bedtime.     traMADol 50 MG tablet  Commonly known as:  ULTRAM  TAKE ONE TABLET BY MOUTH EVERY 12 HOURS AS NEEDED        Diagnostic Studies: Dg Chest 2 View  04/22/2015   CLINICAL DATA:  Shortness of breath.  EXAM: CHEST  2 VIEW  COMPARISON:  None.  FINDINGS: Mediastinum and hilar structures are normal. Mild right perihilar infiltrate cannot be excluded. Cardiomegaly normal pulmonary vascularity. No pleural effusion or pneumothorax.  IMPRESSION: 1.  Mild right perihilar infiltrate cannot be excluded.  2.  Cardiomegaly.  No pulmonary venous congestion.   Electronically Signed   By: Maisie Fus  Register   On: 04/22/2015 13:09  Dg Lumbar Spine 2-3 Views  04/22/2015   CLINICAL DATA:  Chronic low back pain.  EXAM: LUMBAR SPINE - 2-3 VIEW  COMPARISON:  None.  FINDINGS: Paraspinal soft tissues are unremarkable. Surgical clips are noted over the abdomen. Diffuse osteopenia multilevel degenerative change present. 7 mm anterolisthesis L4 on L5. Aortoiliac atherosclerotic vascular disease.  IMPRESSION: 1. Diffuse degenerative change with 7 mm anterolisthesis L4 on L5. 2. No acute bony abnormality.   Electronically Signed   By: Maisie Fus  Register   On: 04/22/2015 13:10   Nm Myocar Multi W/spect W/wall Motion / Ef  04/02/2015    There was no ST segment deviation noted during stress.  Defect 1: There is a medium defect of moderate severity present in the  mid anterior and apical anterior  location.  This is a low risk study.  Fixed anterior defect as outlined, most consistent with breast  attenuation. LVEF 72%.    Dg Spine Portable 1 View  04/25/2015   CLINICAL DATA:  Lumbar disc disease.  Spondylolisthesis.  EXAM: PORTABLE SPINE - 1 VIEW  COMPARISON:  Earlier study on this same date  FINDINGS: Portable image 3 demonstrates 1 instrument at the level of the pedicles of L4 and a second instrument at the level of the pedicles of L5.  IMPRESSION: Instruments at L4 and L5.   Electronically Signed   By: Francene Boyers M.D.   On: 04/25/2015 14:43   Dg Spine Portable 1 View  04/25/2015   CLINICAL DATA:  Lumbar spine pain.  EXAM: PORTABLE SPINE - 1 VIEW  COMPARISON:  April 22, 2015.  FINDINGS: Single intraoperative cross-table lateral projection of the lumbar spine demonstrates surgical probes directed toward the posterior spinous processes of L4 and L5. Grade 1 anterolisthesis of L4-5 is again noted.  IMPRESSION: Surgical localization as described above.   Electronically Signed   By: Lupita Raider, M.D.   On: 04/25/2015 14:05   Dg Spine Portable 1 View  04/25/2015   CLINICAL DATA:  Surgical level check.  EXAM: PORTABLE SPINE - 1 VIEW  COMPARISON:  April 22, 2015.  FINDINGS: Single cross-table intraoperative projection of the lumbar spine demonstrates 2 surgical probes directed toward the posterior spinous processes of L3 and L4. Grade 1 anterolisthesis of L4-5 is again noted.  IMPRESSION: Surgical localization as described above.   Electronically Signed   By: Lupita Raider, M.D.   On: 04/25/2015 13:44    Disposition: 01-Home or Self Care       Signed: Huston Stonehocker B 04/26/2015, 8:44 AM

## 2015-04-26 NOTE — Care Management Note (Signed)
Case Management Note  Patient Details  Name: [name] MRN: [id number] Date of Birth: May 01, 1961  Subjective/Objective:       Lumbar decompression             Action/Plan:  Home Health Expected Discharge Date:       04/26/2015           Expected Discharge Plan:  Home w Home Health Services  In-House Referral:     Discharge planning Services  CM Consult  Post Acute Care Choice:  Home Health Choice offered to:  Patient  DME Arranged:  3-N-1, Walker rolling DME Agency:     HH Arranged:   Home Health Physical Therapy HH Agency:    [facility] Status of Service:  Completed, signed off  Medicare Important Message Given:  No Date Medicare IM Given:    Medicare IM give by:    Date Additional Medicare IM Given:    Additional Medicare Important Message give by:     If discussed at Long Length of Stay Meetings, dates discussed:    Additional Comments: Consulted for Home Health and DME. Offered choice for Home Health. Pt agreeable to [facility] for [facility]. Pt requesting RW and 3n1 for home. Pt states she has a RW with seat at home that was covered by Medicaid 2 years ago. Explained she may not be eligible for another RW this admission. Contacted [facility] for DME for home for delivery to home. Contacted [facility] for [facility] PT. Accepted referral with soc Monday.  Elliot Cousin, RN 04/26/2015, 12:45 PM

## 2015-04-26 NOTE — Evaluation (Signed)
Occupational Therapy Evaluation Patient Details Name: [name] MRN: [id number] DOB: 10/09/1961 Today's Date: 04/26/2015    History of Present Illness pt is s/p L4-5 decompression due to spinal stenosis   Clinical Impression   This 54 year old female was admitted for the above surgery.  She will benefit from skilled OT in acute to reinforce education on back precautions and ADLs.  Goals are for supervision level    Follow Up Recommendations  Supervision/Assistance - 24 hour    Equipment Recommendations  3 in 1 bedside comode (may need wide)    Recommendations for Other Services       Precautions / Restrictions Precautions Precautions: Back Precaution Booklet Issued: Yes (comment) Restrictions Weight Bearing Restrictions: No      Mobility Bed Mobility            General bed mobility comments: oob  Transfers Overall transfer level: Needs assistance Equipment used: Rolling walker (2 wheeled) Transfers: Sit to/from Stand Sit to Stand: Supervision         General transfer comment: cues for back precautions    Balance                                            ADL Overall ADL's : Needs assistance/impaired     Grooming: Set up;Sitting   Upper Body Bathing: Set up;Sitting   Lower Body Bathing: Moderate assistance;Sit to/from stand   Upper Body Dressing : Set up;Sitting   Lower Body Dressing: Moderate assistance;Sit to/from stand                  General ADL Comments: performed ADL.  Pt did not need to use commode-- practiced sit to stand from recliner following back precautions.  Reviewed back precautions in relationship to ADLs.  educated on AE     Vision     Perception     Praxis      Pertinent Vitals/Pain Pain Assessment: 0-10 Pain Score: 2  Pain Location: back Pain Descriptors / Indicators: Sore Pain Intervention(s): Monitored during session;Limited activity within patient's tolerance;Premedicated before  session;Repositioned     Hand Dominance     Extremity/Trunk Assessment Upper Extremity Assessment Upper Extremity Assessment: Overall WFL for tasks assessed      Cervical / Trunk Assessment Cervical / Trunk Assessment: Kyphotic;Lordotic   Communication Communication Communication: No difficulties   Cognition Arousal/Alertness: Awake/alert Behavior During Therapy: WFL for tasks assessed/performed Overall Cognitive Status: Within Functional Limits for tasks assessed                     General Comments       Exercises       Shoulder Instructions      Home Living Family/patient expects to be discharged to:: Private residence Living Arrangements: Spouse/significant other Available Help at Discharge: Family Type of Home: Mobile home Home Access: Ramped entrance;Stairs to enter Entrance Stairs-Number of Steps: 1   Home Layout: One level     Bathroom Shower/Tub: Tub/shower unit Shower/tub characteristics: Engineer, building services: Standard     Home Equipment: Shower seat;Grab bars - tub/shower          Prior Functioning/Environment Level of Independence: Independent;Independent with assistive device(s)             OT Diagnosis: Acute pain   OT Problem List: Decreased strength;Decreased activity tolerance;Decreased knowledge of use of DME  or AE;Pain   OT Treatment/Interventions: Self-care/ADL training;DME and/or AE instruction;Patient/family education    OT Goals(Current goals can be found in the care plan section) Acute Rehab OT Goals Patient Stated Goal: get back to being independent OT Goal Formulation: With patient Time For Goal Achievement: 05/03/15 Potential to Achieve Goals: Good  OT Frequency: Min 2X/week   Barriers to D/C:            Co-evaluation              End of Session    Activity Tolerance: Patient tolerated treatment well Patient left: in chair;with call bell/phone within reach   Time: 0921-0940 OT Time  Calculation (min): 19 min Charges:  OT General Charges $OT Visit: 1 Procedure OT Evaluation $Initial OT Evaluation Tier I: 1 Procedure G-Codes: OT G-codes **NOT FOR INPATIENT CLASS** Functional Assessment Tool Used: clinical observation Functional Limitation: Self care Self Care Current Status (J8563): At least 40 percent but less than 60 percent impaired, limited or restricted Self Care Goal Status (J4970): At least 1 percent but less than 20 percent impaired, limited or restricted  [facility] 04/26/2015, 11:23 AM   Marica Otter, OTR/L [phone] 04/26/2015

## 2015-04-26 NOTE — Evaluation (Signed)
Physical Therapy Evaluation Patient Details Name: [name] MRN: [id number] DOB: [birth year] Today's Date: 04/26/2015   History of Present Illness  pt is s/o L4-5 decompression due to spinal stenosis  Clinical Impression  Pt presents with functional mobility limitations 2* post op pain and back precautions.  Pt is currently functioning at sup/min guard level for all mobility tasks and has been educated on basic back precautions.    Follow Up Recommendations Home health PT    Equipment Recommendations  Rolling walker with 5" wheels (wide RW )    Recommendations for Other Services OT consult     Precautions / Restrictions Precautions Precautions: Back Precaution Booklet Issued: Yes (comment) Restrictions Weight Bearing Restrictions: No      Mobility  Bed Mobility Overal bed mobility: Needs Assistance Bed Mobility: Supine to Sit     Supine to sit: Supervision     General bed mobility comments: oob  Transfers Overall transfer level: Needs assistance Equipment used: Rolling walker (2 wheeled) Transfers: Sit to/from Stand Sit to Stand: Supervision         General transfer comment: cues for back precautions  Ambulation/Gait Ambulation/Gait assistance: Min guard;Supervision Ambulation Distance (Feet): 150 Feet (and 15' into bathroom) Assistive device: Rolling walker (2 wheeled) Gait Pattern/deviations: Step-to pattern;Step-through pattern;Decreased step length - right;Decreased step length - left;Shuffle;Trunk flexed Gait velocity: decr   General Gait Details: min cues for posture and position from RW  Stairs Stairs: Yes Stairs assistance: Min assist Stair Management: No rails;Forwards Number of Stairs: 1 (single step twice) General stair comments: cues for sequence and assist to manage RW  Wheelchair Mobility    Modified Rankin (Stroke Patients Only)       Balance                                             Pertinent  Vitals/Pain Pain Assessment: 0-10 Pain Score: 2  Pain Location: back Pain Descriptors / Indicators: Sore Pain Intervention(s): Monitored during session;Limited activity within patient's tolerance;Premedicated before session;Repositioned    Home Living Family/patient expects to be discharged to:: Private residence Living Arrangements: Spouse/significant other Available Help at Discharge: Family Type of Home: Mobile home Home Access: Ramped entrance;Stairs to enter   Entrance Stairs-Number of Steps: 1 Home Layout: One level Home Equipment: Shower seat;Grab bars - tub/shower      Prior Function Level of Independence: Independent;Independent with assistive device(s)               Hand Dominance        Extremity/Trunk Assessment   Upper Extremity Assessment: Overall WFL for tasks assessed           Lower Extremity Assessment: Overall WFL for tasks assessed      Cervical / Trunk Assessment: Kyphotic;Lordotic  Communication   Communication: No difficulties  Cognition Arousal/Alertness: Awake/alert Behavior During Therapy: WFL for tasks assessed/performed Overall Cognitive Status: Within Functional Limits for tasks assessed                      General Comments      Exercises        Assessment/Plan    PT Assessment Patient needs continued PT services  PT Diagnosis Difficulty walking   PT Problem List Decreased strength;Decreased range of motion;Decreased activity tolerance;Decreased mobility;Decreased knowledge of use of DME;Decreased safety awareness;Obesity;Pain  PT Treatment Interventions Therapeutic activities;Therapeutic exercise;DME  instruction;Gait training;Stair training;Functional mobility training;Patient/family education   PT Goals (Current goals can be found in the Care Plan section) Acute Rehab PT Goals Patient Stated Goal: get back to being independent    Frequency     Barriers to discharge        Co-evaluation                End of Session   Activity Tolerance: Patient tolerated treatment well Patient left: in chair;with call bell/phone within reach Nurse Communication: Mobility status    Functional Assessment Tool Used: clinical judgement Functional Limitation: Mobility: Walking and moving around Mobility: Walking and Moving Around Current Status (W0981): At least 1 percent but less than 20 percent impaired, limited or restricted Mobility: Walking and Moving Around Goal Status [phone]): At least 1 percent but less than 20 percent impaired, limited or restricted Mobility: Walking and Moving Around Discharge Status [phone]): At least 1 percent but less than 20 percent impaired, limited or restricted    Time: 2130-8657 PT Time Calculation (min) (ACUTE ONLY): 38 min   Charges:   PT Evaluation $Initial PT Evaluation Tier I: 1 Procedure PT Treatments $Gait Training: 8-22 mins $Therapeutic Activity: 8-22 mins   PT G Codes:   PT G-Codes **NOT FOR INPATIENT CLASS** Functional Assessment Tool Used: clinical judgement Functional Limitation: Mobility: Walking and moving around Mobility: Walking and Moving Around Current Status (Q4696): At least 1 percent but less than 20 percent impaired, limited or restricted Mobility: Walking and Moving Around Goal Status [phone]): At least 1 percent but less than 20 percent impaired, limited or restricted Mobility: Walking and Moving Around Discharge Status [phone]): At least 1 percent but less than 20 percent impaired, limited or restricted    [name] 04/26/2015, 11:30 AM

## 2015-04-26 NOTE — Progress Notes (Signed)
   04/26/15 1130  OT Visit Information  Last OT Received On 04/26/15  Assistance Needed +1  History of Present Illness pt is s/p L4-5 decompression due to spinal stenosis  OT Time Calculation  OT Start Time (ACUTE ONLY) 1019  OT Stop Time (ACUTE ONLY) 1030  OT Time Calculation (min) 11 min  Precautions  Precautions Back  Pain Assessment  Pain Score 5  Pain Location back  Pain Descriptors / Indicators Aching  Pain Intervention(s) Limited activity within patient's tolerance;Monitored during session;Repositioned  Cognition  Arousal/Alertness Awake/alert  Behavior During Therapy WFL for tasks assessed/performed  Overall Cognitive Status Within Functional Limits for tasks assessed  ADL  General ADL Comments provided reacher, long sponge and tong toilet aide.  Pt practiced with reacher for LB dressing.  Pt needs practice with this but was able to don pants and underwear with cues and extra time.  Did not have any toileting needs at this time.  Min A given to hook bra; pt followed back precautions  Restrictions  Weight Bearing Restrictions No  Transfers  Equipment used Rolling walker (2 wheeled)  Transfers Sit to/from Stand  Sit to Stand Supervision  General transfer comment cues for back precautions  OT - End of Session  Activity Tolerance Patient tolerated treatment well  Patient left in chair;with call bell/phone within reach  OT Assessment/Plan  Follow Up Recommendations Supervision/Assistance - 24 hour  OT Equipment 3 in 1 bedside comode  OT Goal Progression  Progress towards OT goals Progressing toward goals  OT General Charges  $OT Visit 1 Procedure  OT Treatments  $Self Care/Home Management  8-22 mins  Marica Otter, OTR/L [phone] 04/26/2015

## 2015-04-28 ENCOUNTER — Encounter (HOSPITAL_COMMUNITY): Payer: Self-pay | Admitting: Orthopedic Surgery

## 2015-04-28 NOTE — Telephone Encounter (Signed)
rx ready for pickup 

## 2015-04-28 NOTE — Telephone Encounter (Signed)
Last seen 12/30/14  MMM If approved route to nurse to call into [facility]

## 2015-05-08 ENCOUNTER — Encounter: Payer: Self-pay | Admitting: Nurse Practitioner

## 2015-05-08 ENCOUNTER — Ambulatory Visit (INDEPENDENT_AMBULATORY_CARE_PROVIDER_SITE_OTHER): Payer: Medicaid Other | Admitting: Nurse Practitioner

## 2015-05-08 ENCOUNTER — Encounter (INDEPENDENT_AMBULATORY_CARE_PROVIDER_SITE_OTHER): Payer: Self-pay

## 2015-05-08 VITALS — BP 107/61 | HR 83 | Temp 98.7°F | Ht 62.0 in | Wt 307.6 lb

## 2015-05-08 DIAGNOSIS — K219 Gastro-esophageal reflux disease without esophagitis: Secondary | ICD-10-CM

## 2015-05-08 DIAGNOSIS — G4700 Insomnia, unspecified: Secondary | ICD-10-CM

## 2015-05-08 DIAGNOSIS — F411 Generalized anxiety disorder: Secondary | ICD-10-CM | POA: Diagnosis not present

## 2015-05-08 DIAGNOSIS — I10 Essential (primary) hypertension: Secondary | ICD-10-CM | POA: Diagnosis not present

## 2015-05-08 DIAGNOSIS — E785 Hyperlipidemia, unspecified: Secondary | ICD-10-CM | POA: Diagnosis not present

## 2015-05-08 DIAGNOSIS — I2510 Atherosclerotic heart disease of native coronary artery without angina pectoris: Secondary | ICD-10-CM

## 2015-05-08 DIAGNOSIS — E0843 Diabetes mellitus due to underlying condition with diabetic autonomic (poly)neuropathy: Secondary | ICD-10-CM | POA: Diagnosis not present

## 2015-05-08 DIAGNOSIS — E1142 Type 2 diabetes mellitus with diabetic polyneuropathy: Secondary | ICD-10-CM

## 2015-05-08 DIAGNOSIS — E1310 Other specified diabetes mellitus with ketoacidosis without coma: Secondary | ICD-10-CM | POA: Diagnosis not present

## 2015-05-08 DIAGNOSIS — E1110 Type 2 diabetes mellitus with ketoacidosis without coma: Secondary | ICD-10-CM

## 2015-05-08 DIAGNOSIS — R609 Edema, unspecified: Secondary | ICD-10-CM

## 2015-05-08 LAB — POCT GLYCOSYLATED HEMOGLOBIN (HGB A1C): Hemoglobin A1C: 6.6

## 2015-05-08 MED ORDER — OMEPRAZOLE 40 MG PO CPDR: 40.0000 mg | DELAYED_RELEASE_CAPSULE | Freq: Every day | ORAL | Status: DC

## 2015-05-08 MED ORDER — FUROSEMIDE 80 MG PO TABS: 80.0000 mg | ORAL_TABLET | Freq: Two times a day (BID) | ORAL | Status: DC

## 2015-05-08 MED ORDER — ROSUVASTATIN CALCIUM 20 MG PO TABS: 20.0000 mg | ORAL_TABLET | Freq: Every day | ORAL | Status: DC

## 2015-05-08 MED ORDER — METFORMIN HCL 500 MG PO TABS: 500.0000 mg | ORAL_TABLET | Freq: Every morning | ORAL | Status: DC

## 2015-05-08 NOTE — Patient Instructions (Signed)
Diabetic Neuropathy Diabetic neuropathy is a nerve disease or nerve damage that is caused by diabetes mellitus. About half of all people with diabetes mellitus have some form of nerve damage. Nerve damage is more common in those who have had diabetes mellitus for many years and who generally have not had good control of their blood sugar (glucose) level. Diabetic neuropathy is a common complication of diabetes mellitus. There are three more common types of diabetic neuropathy and a fourth type that is less common and less understood:   Peripheral neuropathy--This is the most common type of diabetic neuropathy. It causes damage to the nerves of the feet and legs first and then eventually the hands and arms.The damage affects the ability to sense touch.  Autonomic neuropathy--This type causes damage to the autonomic nervous system, which controls the following functions:  Heartbeat.  Body temperature.  Blood pressure.  Urination.  Digestion.  Sweating.  Sexual function.  Focal neuropathy--Focal neuropathy can be painful and unpredictable and occurs most often in older adults with diabetes mellitus. It involves a specific nerve or one area and often comes on suddenly. It usually does not cause long-term problems.  Radiculoplexus neuropathy-- Sometimes called lumbosacral radiculoplexus neuropathy, radiculoplexus neuropathy affects the nerves of the thighs, hips, buttocks, or legs. It is more common in people with type 2 diabetes mellitus and in older men. It is characterized by debilitating pain, weakness, and atrophy, usually in the thigh muscles. CAUSES  The cause of peripheral, autonomic, and focal neuropathies is diabetes mellitus that is uncontrolled and high glucose levels. The cause of radiculoplexus neuropathy is unknown. However, it is thought to be caused by inflammation related to uncontrolled glucose levels. SIGNS AND SYMPTOMS  Peripheral Neuropathy Peripheral neuropathy develops  slowly over time. When the nerves of the feet and legs no longer work there may be:   Burning, stabbing, or aching pain in the legs or feet.  Inability to feel pressure or pain in your feet. This can lead to:  Thick calluses over pressure areas.  Pressure sores.  Ulcers.  Foot deformities.  Reduced ability to feel temperature changes.  Muscle weakness. Autonomic Neuropathy The symptoms of autonomic neuropathy vary depending on which nerves are affected. Symptoms may include:  Problems with digestion, such as:  Feeling sick to your stomach (nausea).  Vomiting.  Bloating.  Constipation.  Diarrhea.  Abdominal pain.  Difficulty with urination. This occurs if you lose your ability to sense when your bladder is full. Problems include:  Urine leakage (incontinence).  Inability to empty your bladder completely (retention).  Rapid or irregular heartbeat (palpitations).  Blood pressure drops when you stand up (orthostatic hypotension). When you stand up you may feel:  Dizzy.  Weak.  Faint.  In men, inability to attain and maintain an erection.  In women, vaginal dryness and problems with decreased sexual desire and arousal.  Problems with body temperature regulation.  Increased or decreased sweating. Focal Neuropathy  Abnormal eye movements or abnormal alignment of both eyes.  Weakness in the wrist.  Foot drop. This results in an inability to lift the foot properly and abnormal walking or foot movement.  Paralysis on one side of your face (Bell palsy).  Chest or abdominal pain. Radiculoplexus Neuropathy  Sudden, severe pain in your hip, thigh, or buttocks.  Weakness and wasting of thigh muscles.  Difficulty rising from a seated position.  Abdominal swelling.  Unexplained weight loss (usually more than 10 lb [4.5 kg]). DIAGNOSIS  Peripheral Neuropathy Your senses may   be tested. Sensory function testing can be done with:  A light touch using a  monofilament.  A vibration with tuning fork.  A sharp sensation with a pin prick. Other tests that can help diagnose neuropathy are:  Nerve conduction velocity. This test checks the transmission of an electrical current through a nerve.  Electromyography. This shows how muscles respond to electrical signals transmitted by nearby nerves.  Quantitative sensory testing. This is used to assess how your nerves respond to vibrations and changes in temperature. Autonomic Neuropathy Diagnosis is often based on reported symptoms. Tell your health care provider if you experience:   Dizziness.   Constipation.   Diarrhea.   Inappropriate urination or inability to urinate.   Inability to get or maintain an erection.  Tests that may be done include:   Electrocardiography or Holter monitor. These are tests that can help show problems with the heart rate or heart rhythm.   An X-ray exam may be done. Focal Neuropathy Diagnosis is made based on your symptoms and what your health care provider finds during your exam. Other tests may be done. They may include:  Nerve conduction velocities. This checks the transmission of electrical current through a nerve.  Electromyography. This shows how muscles respond to electrical signals transmitted by nearby nerves.  Quantitative sensory testing. This test is used to assess how your nerves respond to vibration and changes in temperature. Radiculoplexus Neuropathy  Often the first thing is to eliminate any other issue or problems that might be the cause, as there is no stick test for diagnosis.  X-ray exam of your spine and lumbar region.  Spinal tap to rule out cancer.  MRI to rule out other lesions. TREATMENT  Once nerve damage occurs, it cannot be reversed. The goal of treatment is to keep the disease or nerve damage from getting worse and affecting more nerve fibers. Controlling your blood glucose level is the key. Most people with  radiculoplexus neuropathy see at least a partial improvement over time. You will need to keep your blood glucose and HbA1c levels in the target range determined by your health care provider. Things that help control blood glucose levels include:   Blood glucose monitoring.   Meal planning.   Physical activity.   Diabetes medicine.  Over time, maintaining lower blood glucose levels helps lessen symptoms. Sometimes, prescription pain medicine is needed. HOME CARE INSTRUCTIONS:  Do not smoke.  Keep your blood glucose level in the range that you and your health care provider have determined acceptable for you.  Keep your blood pressure level in the range that you and your health care provider have determined acceptable for you.  Eat a well-balanced diet.  Be active every day.  Check your feet every day. SEEK MEDICAL CARE IF:   You have burning, stabbing, or aching pain in the legs or feet.  You are unable to feel pressure or pain in your feet.  You develop problems with digestion such as:  Nausea.  Vomiting.  Bloating.  Constipation.  Diarrhea.  Abdominal pain.  You have difficulty with urination, such as:  Incontinence.  Retention.  You have palpitations.  You develop orthostatic hypotension. When you stand up you may feel:  Dizzy.  Weak.  Faint.  You cannot attain and maintain an erection (in men).  You have vaginal dryness and problems with decreased sexual desire and arousal (in women).  You have severe pain in your thighs, legs, or buttocks.  You have unexplained weight loss.   Document Released: 01/03/2002 Document Revised: 08/15/2013 Document Reviewed: 04/05/2013 [employer] Patient Information 2015 [employer]. This information is not intended to replace advice given to you by your health care provider. Make sure you discuss any questions you have with your health care provider. 

## 2015-05-08 NOTE — Progress Notes (Signed)
Subjective:    Patient ID: [name], female    DOB: 09/03/1961, 54 y.o.   MRN: [id number]  Patient here today for follow up of chronic medical problems.  No complaints today.   Diabetes She presents for her follow-up diabetic visit. She has type 2 diabetes mellitus. No MedicAlert identification noted. Her disease course has been stable. There are no hypoglycemic associated symptoms. Associated symptoms include fatigue and visual change. Risk factors for coronary artery disease include diabetes mellitus, dyslipidemia, hypertension, sedentary lifestyle and post-menopausal. When asked about current treatments, none were reported. She is compliant with treatment most of the time. Her weight is stable. She is following a diabetic diet. She has not had a previous visit with a dietitian. There is no change in her home blood glucose trend. Her breakfast blood glucose is taken between 8-9 am. Her breakfast blood glucose range is generally 130-140 mg/dl. Her overall blood glucose range is 130-140 mg/dl. An ACE inhibitor/angiotensin II receptor blocker is being taken. She does not see a podiatrist.Eye exam is not current.  Hyperlipidemia This is a chronic problem. The current episode started more than 1 year ago. The problem is uncontrolled. Recent lipid tests were reviewed and are normal. Exacerbating diseases include diabetes and obesity. She has no history of hypothyroidism. Associated symptoms include shortness of breath. Current antihyperlipidemic treatment includes statins. The current treatment provides moderate improvement of lipids. Compliance problems include adherence to diet and adherence to exercise.  Risk factors for coronary artery disease include diabetes mellitus, dyslipidemia, family history, obesity, hypertension, post-menopausal and a sedentary lifestyle.  Peripheral Neuropathy  Patient is on neuropathy and says that her feet are still burning bad- Leg pain & Spasm /arthritis Pt taking  flexeril-Pt states it helps- Has to walk with a cane in order to keep from falling. Wants something for pain GERD Patient  Only taking TUMS and zantac- says it is daily- OTC meds not helping Peripheral edema Swelling of bil lower ext has worsened over the last month. Taking lasix daily. GAD On no medds curently for this- tries stress management which helps   Review of Systems  Constitutional: Positive for fatigue.  HENT: Negative.   Respiratory: Positive for shortness of breath.   Genitourinary: Negative.   Neurological: Negative.   Psychiatric/Behavioral: Negative.   All other systems reviewed and are negative.      Objective:   Physical Exam  Constitutional: She is oriented to person, place, and time. She appears well-developed and well-nourished.  HENT:  Head: Normocephalic.  Right Ear: External ear normal.  Left Ear: External ear normal.  Eyes: Pupils are equal, round, and reactive to light.  Neck: Normal range of motion. Neck supple. No thyromegaly present.  Cardiovascular: Normal rate, regular rhythm, normal heart sounds and intact distal pulses.   Pulmonary/Chest: Effort normal and breath sounds normal. No respiratory distress.  Abdominal: Soft. Bowel sounds are normal. She exhibits no distension. There is no tenderness.  Musculoskeletal: Normal range of motion. She exhibits edema (3+ edema bilaterally in feet).  Neurological: She is alert and oriented to person, place, and time.  Skin: Skin is warm and dry. No rash noted. No erythema. No pallor.  Psychiatric: She has a normal mood and affect. Her behavior is normal. Judgment and thought content normal.  Vitals reviewed.  BP 107/61 mmHg  Pulse 83  Temp(Src) 98.7 F (37.1 C) (Oral)  Ht '5\' 2"'  (1.575 m)  Wt 307 lb 9.6 oz (139.526 kg)  BMI 56.25 kg/m2  Results  for orders placed or performed in visit on 05/08/15  POCT glycosylated hemoglobin (Hb A1C)  Result Value Ref Range   Hemoglobin A1C 6.6            Assessment & Plan:   1. DM (diabetes mellitus) type 2, uncontrolled, with ketoacidosis Continue to watch carbs in diet - POCT glycosylated hemoglobin (Hb A1C)  2. Hyperlipidemia with target LDL less than 100 Low fat diet - Lipid panel - rosuvastatin (CRESTOR) 20 MG tablet; Take 1 tablet (20 mg total) by mouth at bedtime.  Dispense: 30 tablet; Refill: 5  3. Essential hypertension, benign Do not add salt to diet - CMP14+EGFR  4. Coronary artery disease involving native coronary artery of native heart without angina pectoris  5. Gastroesophageal reflux disease without esophagitis Avoid spicy foods Do not eat 2 hours prior to bedtime - omeprazole (PRILOSEC) 40 MG capsule; Take 1 capsule (40 mg total) by mouth daily at 3 pm.  Dispense: 30 capsule; Refill: 5  6. Type 2 diabetes mellitus with diabetic polyneuropathy  - metFORMIN (GLUCOPHAGE) 500 MG tablet; Take 1 tablet (500 mg total) by mouth every morning.  Dispense: 30 tablet; Refill: 5  7. Diabetic autonomic neuropathy associated with diabetes mellitus due to underlying condition   8. Peripheral edema Elevate legs when sitting - furosemide (LASIX) 80 MG tablet; Take 1 tablet (80 mg total) by mouth 2 (two) times daily.  Dispense: 60 tablet; Refill: 3  9. Morbid obesity Discussed diet and exercise for person with BMI >25 Will recheck weight in 3-6 months  10. Insomnia Bedtime ritua;  11. Generalized anxiety disorder Stress management    Labs pending Health maintenance reviewed Diet and exercise encouraged Continue all meds Follow up  In 3 month   [place], FNP

## 2015-05-09 LAB — CMP14+EGFR
ALT: 6 IU/L (ref 0–32)
AST: 12 IU/L (ref 0–40)
Albumin/Globulin Ratio: 1.8 (ref 1.1–2.5)
Albumin: 4.2 g/dL (ref 3.5–5.5)
Alkaline Phosphatase: 93 IU/L (ref 39–117)
BUN/Creatinine Ratio: 9 (ref 9–23)
BUN: 12 mg/dL (ref 6–24)
Bilirubin Total: 0.3 mg/dL (ref 0.0–1.2)
CO2: 26 mmol/L (ref 18–29)
Calcium: 9 mg/dL (ref 8.7–10.2)
Chloride: 98 mmol/L (ref 97–108)
Creatinine, Ser: 1.3 mg/dL — ABNORMAL HIGH (ref 0.57–1.00)
GFR calc Af Amer: 53 mL/min/{1.73_m2} — ABNORMAL LOW (ref >59)
GFR calc non Af Amer: 46 mL/min/{1.73_m2} — ABNORMAL LOW (ref >59)
Globulin, Total: 2.3 g/dL (ref 1.5–4.5)
Glucose: 98 mg/dL (ref 65–99)
Potassium: 3.4 mmol/L — ABNORMAL LOW (ref 3.5–5.2)
Sodium: 144 mmol/L (ref 134–144)
Total Protein: 6.5 g/dL (ref 6.0–8.5)

## 2015-05-09 LAB — LIPID PANEL
Chol/HDL Ratio: 3.4 ratio units (ref 0.0–4.4)
Cholesterol, Total: 129 mg/dL (ref 100–199)
HDL: 38 mg/dL — ABNORMAL LOW (ref >39)
LDL Calculated: 44 mg/dL (ref 0–99)
Triglycerides: 236 mg/dL — ABNORMAL HIGH (ref 0–149)
VLDL Cholesterol Cal: 47 mg/dL — ABNORMAL HIGH (ref 5–40)

## 2015-07-01 ENCOUNTER — Other Ambulatory Visit: Payer: Self-pay | Admitting: Nurse Practitioner

## 2015-07-31 DIAGNOSIS — Z4789 Encounter for other orthopedic aftercare: Secondary | ICD-10-CM | POA: Diagnosis not present

## 2015-07-31 DIAGNOSIS — M5416 Radiculopathy, lumbar region: Secondary | ICD-10-CM | POA: Diagnosis not present

## 2015-07-31 DIAGNOSIS — M4806 Spinal stenosis, lumbar region: Secondary | ICD-10-CM | POA: Diagnosis not present

## 2015-08-01 ENCOUNTER — Other Ambulatory Visit: Payer: Self-pay | Admitting: Nurse Practitioner

## 2015-08-11 ENCOUNTER — Other Ambulatory Visit: Payer: Self-pay | Admitting: Nurse Practitioner

## 2015-08-11 ENCOUNTER — Other Ambulatory Visit: Payer: Self-pay | Admitting: *Deleted

## 2015-08-11 MED ORDER — IVERMECTIN 0.5 % EX LOTN: 1.0000 "application " | TOPICAL_LOTION | Freq: Once | CUTANEOUS | Status: DC

## 2015-08-11 NOTE — Telephone Encounter (Signed)
Pt aware Rx sent to pharmacy 

## 2015-08-11 NOTE — Telephone Encounter (Signed)
sklice rx sent to pharmacy 

## 2015-08-22 ENCOUNTER — Encounter (HOSPITAL_COMMUNITY): Payer: Self-pay | Admitting: *Deleted

## 2015-08-22 ENCOUNTER — Emergency Department (HOSPITAL_COMMUNITY)
Admission: EM | Admit: 2015-08-22 | Discharge: 2015-08-23 | Discharge status: Against Medical Advice | Payer: Medicare Other | Attending: Emergency Medicine | Admitting: Emergency Medicine

## 2015-08-22 ENCOUNTER — Emergency Department (HOSPITAL_COMMUNITY): Payer: Medicare Other

## 2015-08-22 DIAGNOSIS — Z7982 Long term (current) use of aspirin: Secondary | ICD-10-CM | POA: Diagnosis not present

## 2015-08-22 DIAGNOSIS — K219 Gastro-esophageal reflux disease without esophagitis: Secondary | ICD-10-CM | POA: Insufficient documentation

## 2015-08-22 DIAGNOSIS — Z9981 Dependence on supplemental oxygen: Secondary | ICD-10-CM | POA: Diagnosis not present

## 2015-08-22 DIAGNOSIS — I129 Hypertensive chronic kidney disease with stage 1 through stage 4 chronic kidney disease, or unspecified chronic kidney disease: Secondary | ICD-10-CM | POA: Diagnosis not present

## 2015-08-22 DIAGNOSIS — R0789 Other chest pain: Secondary | ICD-10-CM | POA: Insufficient documentation

## 2015-08-22 DIAGNOSIS — G4733 Obstructive sleep apnea (adult) (pediatric): Secondary | ICD-10-CM | POA: Insufficient documentation

## 2015-08-22 DIAGNOSIS — N189 Chronic kidney disease, unspecified: Secondary | ICD-10-CM | POA: Diagnosis not present

## 2015-08-22 DIAGNOSIS — Z79899 Other long term (current) drug therapy: Secondary | ICD-10-CM | POA: Diagnosis not present

## 2015-08-22 DIAGNOSIS — Z720 Tobacco use: Secondary | ICD-10-CM | POA: Insufficient documentation

## 2015-08-22 DIAGNOSIS — R072 Precordial pain: Secondary | ICD-10-CM | POA: Diagnosis not present

## 2015-08-22 DIAGNOSIS — I2510 Atherosclerotic heart disease of native coronary artery without angina pectoris: Secondary | ICD-10-CM | POA: Insufficient documentation

## 2015-08-22 DIAGNOSIS — M1990 Unspecified osteoarthritis, unspecified site: Secondary | ICD-10-CM | POA: Insufficient documentation

## 2015-08-22 DIAGNOSIS — E119 Type 2 diabetes mellitus without complications: Secondary | ICD-10-CM | POA: Diagnosis not present

## 2015-08-22 DIAGNOSIS — Z9119 Patient's noncompliance with other medical treatment and regimen: Secondary | ICD-10-CM | POA: Insufficient documentation

## 2015-08-22 DIAGNOSIS — R079 Chest pain, unspecified: Secondary | ICD-10-CM | POA: Diagnosis not present

## 2015-08-22 DIAGNOSIS — E876 Hypokalemia: Secondary | ICD-10-CM | POA: Diagnosis not present

## 2015-08-22 LAB — BASIC METABOLIC PANEL
Anion gap: 10 (ref 5–15)
BUN: 15 mg/dL (ref 6–20)
CO2: 28 mmol/L (ref 22–32)
Calcium: 9.1 mg/dL (ref 8.9–10.3)
Chloride: 101 mmol/L (ref 101–111)
Creatinine, Ser: 1.34 mg/dL — ABNORMAL HIGH (ref 0.44–1.00)
GFR calc Af Amer: 50 mL/min — ABNORMAL LOW (ref >60)
GFR calc non Af Amer: 43 mL/min — ABNORMAL LOW (ref >60)
Glucose, Bld: 126 mg/dL — ABNORMAL HIGH (ref 65–99)
Potassium: 3 mmol/L — ABNORMAL LOW (ref 3.5–5.1)
Sodium: 139 mmol/L (ref 135–145)

## 2015-08-22 LAB — CBC
HCT: 38.8 % (ref 36.0–46.0)
Hemoglobin: 12.9 g/dL (ref 12.0–15.0)
MCH: 31.7 pg (ref 26.0–34.0)
MCHC: 33.2 g/dL (ref 30.0–36.0)
MCV: 95.3 fL (ref 78.0–100.0)
Platelets: 140 10*3/uL — ABNORMAL LOW (ref 150–400)
RBC: 4.07 MIL/uL (ref 3.87–5.11)
RDW: 16.4 % — ABNORMAL HIGH (ref 11.5–15.5)
WBC: 8.8 10*3/uL (ref 4.0–10.5)

## 2015-08-22 LAB — TROPONIN I: Troponin I: <0.03 ng/mL (ref <0.031)

## 2015-08-22 MED ORDER — NITROGLYCERIN 0.4 MG SL SUBL
0.4000 mg | SUBLINGUAL_TABLET | SUBLINGUAL | Status: DC | PRN
  Administered 2015-08-22: 0.4 mg via SUBLINGUAL
  Filled 2015-08-22: qty 1

## 2015-08-22 NOTE — ED Notes (Signed)
Pt arrived by EMS from home complaining of chest pain that started this morning. Pt took 325 mg ASA at home.

## 2015-08-22 NOTE — ED Provider Notes (Signed)
CSN: [id number]     Arrival date & time 08/22/15  2204 History  By signing my name below, I, Doreatha Martin, attest that this documentation has been prepared under the direction and in the presence of Zadie Rhine, MD. Electronically Signed: Doreatha Martin, ED Scribe. 08/22/2015. 11:18 PM.    Chief Complaint  Patient presents with  . Chest Pain   Patient is a 54 y.o. female presenting with chest pain. The history is provided by the patient. No language interpreter was used.  Chest Pain Pain location:  Substernal area Pain quality: aching and pressure   Pain quality: not radiating   Pain radiates to:  Does not radiate Pain radiates to the back: no   Pain severity:  Moderate Timing:  Intermittent Progression:  Worsening Ineffective treatments:  Aspirin Associated symptoms: abdominal pain and shortness of breath   Associated symptoms: no back pain, no dizziness, no fever, no near-syncope, no numbness, no syncope and not vomiting   Risk factors: diabetes mellitus and high cholesterol     HPI Comments: AZARI JANSSENS is a 54 y.o. female with hx of HLD, CAD, type II DM, CAD, CKD who presents to the Emergency Department complaining of moderate, intermittent, non-radiating, aching episodes of central chest pain and pressure, lasting minutes at a time, onset this morning and worsened tonight to last longer. She states associated SOB, and also reports abdominal pain. She also notes tingling and burning to BLE that has not worsened from baseline (h/o diabetes). Pt took 325 ASA prior to arrival. No NTG tried PTA. Hx of similar symptoms years ago, cardiac stent 6 years ago, mild MI before stent placement. Cardiologist is Dr. Diona Browner. Pt is s/p back surgery 04/25/15 (back pains controlled by Oxycodone). She denies fever, syncope, back pain, vomiting, numbness, dizziness.   Past Medical History  Diagnosis Date  . GERD (gastroesophageal reflux disease)   . Hyperlipidemia   . Noncompliance   . CAD  (coronary artery disease)     BMS to circumflex 2007 - Dr. Juanda Chance  . Morbid obesity (HCC)   . Type 2 diabetes mellitus (HCC)   . Essential hypertension   . HA (headache)   . Left-sided face pain   . OSA (obstructive sleep apnea)     uses CPAP; pt states also uses 4L/M O2 during the daytime if needed; pt also states O2 is also bled into CPAP at night   . Shortness of breath dyspnea     exertion  . Chronic kidney disease     stage 3  . Urinary incontinence   . Arthritis   . Cancer [facility])     pt states had cancer in past; was treated through hysterectomy   . Falls frequently    Past Surgical History  Procedure Laterality Date  . Cholecystectomy    . Abdominal hysterectomy    . Colonoscopy N/A 04/10/2013    Procedure: COLONOSCOPY;  Surgeon: Malissa Hippo, MD;  [facility];  Service: Endoscopy;  Laterality: N/A;  . Eye surgery      past cataract surgery bilat   . Cardiac catheterization    . Tubal ligation    . Hernia repair      abdominal   . Breast surgery      biopsy per right breast; pt states has silver piece in for marking   . Lumbar laminectomy/decompression microdiscectomy N/A 04/25/2015    Procedure: CENTRAL LUMBAR /DECOMPRESSION L4-L5;  Surgeon: Ranee Gosselin, MD;  Location: [facility];  Service:  Orthopedics;  Laterality: N/A;  . Back surgery     Family History  Problem Relation Age of Onset  . Coronary artery disease Father   . Cancer - Colon Father   . Breast cancer Mother   . Diabetes Father   . High Cholesterol Father    Social History  Substance Use Topics  . Smoking status: Current Every Day Smoker -- 0.50 packs/day for 34 years    Types: Cigarettes  . Smokeless tobacco: Never Used  . Alcohol Use: No   OB History    No data available     Review of Systems  Constitutional: Negative for fever.  Respiratory: Positive for shortness of breath.   Cardiovascular: Positive for chest pain. Negative for syncope and near-syncope.  Gastrointestinal:  Positive for abdominal pain. Negative for vomiting.  Musculoskeletal: Negative for back pain.  Neurological: Negative for dizziness, syncope and numbness.  All other systems reviewed and are negative.  Allergies  Morphine and related  Home Medications   Prior to Admission medications   Medication Sig Start Date End Date Taking? Authorizing Provider  aspirin EC 325 MG tablet Take 325 mg by mouth every morning.    Yes Historical Provider, MD  furosemide (LASIX) 80 MG tablet Take 1 tablet (80 mg total) by mouth 2 (two) times daily. 05/08/15  Yes Mary-Margaret Daphine Deutscher, FNP  gabapentin (NEURONTIN) 300 MG capsule TAKE ONE CAPSULE BY MOUTH THREE TIMES DAILY 08/01/15  Yes Mary-Margaret Daphine Deutscher, FNP  metFORMIN (GLUCOPHAGE) 500 MG tablet Take 1 tablet (500 mg total) by mouth every morning. 05/08/15  Yes Mary-Margaret Daphine Deutscher, FNP  omeprazole (PRILOSEC) 40 MG capsule Take 1 capsule (40 mg total) by mouth daily at 3 pm. 05/08/15  Yes Mary-Margaret Daphine Deutscher, FNP  oxyCODONE-acetaminophen (PERCOCET/ROXICET) 5-325 MG tablet Take 1 tablet by mouth every 8 (eight) hours as needed for moderate pain or severe pain.   Yes Historical Provider, MD  rosuvastatin (CRESTOR) 20 MG tablet Take 1 tablet (20 mg total) by mouth at bedtime. 05/08/15  Yes Mary-Margaret Daphine Deutscher, FNP  glucose blood (ACCU-CHEK AVIVA PLUS) test strip Use aTest blood sugar once daily 03/31/15   Ernestina Penna, MD  Ivermectin (SKLICE) 0.5 % LOTN Apply 1 application topically once. Patient not taking: Reported on 08/22/2015 08/11/15   Mary-Margaret Daphine Deutscher, FNP  traMADol (ULTRAM) 50 MG tablet TAKE ONE TABLET BY MOUTH EVERY 12 HOURS AS NEEDED Patient not taking: Reported on 05/08/2015 04/28/15   Mary-Margaret Daphine Deutscher, FNP   BP 129/72 mmHg  Pulse 80  Temp(Src) 97.7 F (36.5 C) (Oral)  Resp 20  Ht  (1.575 m)  Wt 299 lb (135.626 kg)  BMI 54.67 kg/m2  SpO2 97% Physical Exam CONSTITUTIONAL: Well developed/well nourished HEAD:  Normocephalic/atraumatic EYES: EOMI/PERRL ENMT: Mucous membranes moist NECK: supple no meningeal signs SPINE/BACK:entire spine nontender CV: S1/S2 noted, no murmurs/rubs/gallops noted LUNGS: Lungs are clear to auscultation bilaterally, no apparent distress ABDOMEN: soft, nontender, no rebound or guarding, bowel sounds noted throughout abdomen GU:no cva tenderness NEURO: Pt is awake/alert/appropriate, moves all extremitiesx4.  No facial droop.   EXTREMITIES: pulses normal/equal, full ROM SKIN: warm, color normal PSYCH: no abnormalities of mood noted, alert and oriented to situation  ED Course  Procedures  Medications  nitroGLYCERIN (NITROSTAT) SL tablet 0.4 mg (0.4 mg Sublingual Given 08/22/15 2318)  potassium chloride SA (K-DUR,KLOR-CON) CR tablet 40 mEq (40 mEq Oral Given 08/23/15 0025)    DIAGNOSTIC STUDIES: Oxygen Saturation is 97% on RA, normal by my interpretation.    COORDINATION OF  CARE: 11:15 PM Discussed treatment plan with pt at bedside and pt agreed to plan.  12:45 AM Pt with h/o CAD here with chest pressure now resolved with NTG Review of chart reveals low risk stress test in spring 2016 I advised admission to hospital due to high risk features and I consulted hospitalist Pt refuses admission but will stay for repeat troponin at 130am  2:33 AM BP 108/63 mmHg  Pulse 70  Temp(Src) 97.7 F (36.5 C) (Oral)  Resp 17  Ht 5\' 2"  (1.575 m)  Wt 299 lb (135.626 kg)  BMI 54.67 kg/m2  SpO2 96% Pt without recurrent symptoms She would like to go home  I discussed risk of death/disability of leaving against medical advice and the patient accepts these risks.  The patient is awake/alert able to make decisions, and does not appear intoxicated Patient discharged against medical advice.   Labs Review Labs Reviewed  BASIC METABOLIC PANEL - Abnormal; Notable for the following:    Potassium 3.0 (*)    Glucose, Bld 126 (*)    Creatinine, Ser 1.34 (*)    GFR calc non Af Amer  43 (*)    GFR calc Af Amer 50 (*)    All other components within normal limits  CBC - Abnormal; Notable for the following:    RDW 16.4 (*)    Platelets 140 (*)    All other components within normal limits  TROPONIN I  TROPONIN I    Imaging Review Dg Chest 2 View  08/22/2015  CLINICAL DATA:  Chest pain EXAM: CHEST  2 VIEW COMPARISON:  04/22/2015 FINDINGS: The heart size and mediastinal contours are within normal limits. Both lungs are clear. The visualized skeletal structures are unremarkable. IMPRESSION: No active cardiopulmonary disease. Electronically Signed   By: Natasha MeadLiviu  Pop M.D.   On: 08/22/2015 22:52   I have personally reviewed and evaluated these images and lab results as part of my medical decision-making.   EKG Interpretation   Date/Time:  Friday August 22 2015 22:12:35 EDT Ventricular Rate:  79 PR Interval:  171 QRS Duration: 127 QT Interval:  442 QTC Calculation: 507 R Axis:   102 Text Interpretation:  Sinus rhythm RBBB and LPFB No significant change  since last tracing Confirmed by STEINL  MD, Caryn BeeKEVIN ([id number]) on 08/22/2015  10:24:34 PM      EKG Interpretation  Date/Time:  Saturday August 23 2015 01:29:10 EDT Ventricular Rate:  73 PR Interval:  175 QRS Duration: 124 QT Interval:  432 QTC Calculation: 476 R Axis:   115 Text Interpretation:  Sinus rhythm RBBB and LPFB No significant change since last tracing Confirmed by Bebe ShaggyWICKLINE  MD, Dorinda HillNALD [id number]) on 08/23/2015 1:34:00 AM       MDM   Final diagnoses:  Chest pain, rule out acute myocardial infarction  Hypokalemia    Nursing notes including past medical history and social history reviewed and considered in documentation xrays/imaging reviewed by myself and considered during evaluation Labs/vital reviewed myself and considered during evaluation   I, Joya GaskinsWICKLINE,Jerrik Housholder W, personally performed the services described in this documentation. All medical record entries made by the scribe were at my direction  and in my presence.  I have reviewed the chart and discharge instructions and agree that the record reflects my personal performance and is accurate and complete. Joya GaskinsWICKLINE,Jiselle Sheu W.  08/23/2015. 12:45 AM.        Zadie Rhineonald Deklyn Trachtenberg, MD 08/23/15 [phone]

## 2015-08-23 DIAGNOSIS — R0789 Other chest pain: Secondary | ICD-10-CM | POA: Diagnosis not present

## 2015-08-23 LAB — TROPONIN I: Troponin I: <0.03 ng/mL (ref <0.031)

## 2015-08-23 MED ORDER — POTASSIUM CHLORIDE CRYS ER 20 MEQ PO TBCR
40.0000 meq | EXTENDED_RELEASE_TABLET | Freq: Once | ORAL | Status: AC
  Administered 2015-08-23: 40 meq via ORAL
  Filled 2015-08-23: qty 2

## 2015-08-23 NOTE — ED Notes (Signed)
Pt alert & oriented x4, stable gait. Patient  given discharge instructions, paperwork & prescription(s).  Patient verbalized understanding. Pt left department in wheelchair w/ no further questions. 

## 2015-08-23 NOTE — ED Notes (Signed)
EDP in speaking to pt about admission. Pt says feeling better & wants to go home. EDP recommended to have follow up lab & pt agreed.

## 2015-08-26 ENCOUNTER — Encounter: Payer: Self-pay | Admitting: Nurse Practitioner

## 2015-08-27 ENCOUNTER — Ambulatory Visit: Payer: Medicaid Other | Admitting: Neurology

## 2015-09-08 ENCOUNTER — Ambulatory Visit (INDEPENDENT_AMBULATORY_CARE_PROVIDER_SITE_OTHER): Payer: Medicare Other | Admitting: Cardiology

## 2015-09-08 ENCOUNTER — Other Ambulatory Visit: Payer: Self-pay | Admitting: Nurse Practitioner

## 2015-09-08 ENCOUNTER — Encounter: Payer: Self-pay | Admitting: Cardiology

## 2015-09-08 VITALS — BP 100/68 | HR 83 | Ht 62.0 in | Wt 317.4 lb

## 2015-09-08 DIAGNOSIS — I25119 Atherosclerotic heart disease of native coronary artery with unspecified angina pectoris: Secondary | ICD-10-CM | POA: Diagnosis not present

## 2015-09-08 DIAGNOSIS — I2510 Atherosclerotic heart disease of native coronary artery without angina pectoris: Secondary | ICD-10-CM

## 2015-09-08 DIAGNOSIS — E782 Mixed hyperlipidemia: Secondary | ICD-10-CM | POA: Diagnosis not present

## 2015-09-08 DIAGNOSIS — I10 Essential (primary) hypertension: Secondary | ICD-10-CM | POA: Diagnosis not present

## 2015-09-08 DIAGNOSIS — I2584 Coronary atherosclerosis due to calcified coronary lesion: Secondary | ICD-10-CM

## 2015-09-08 DIAGNOSIS — I890 Lymphedema, not elsewhere classified: Secondary | ICD-10-CM | POA: Diagnosis not present

## 2015-09-08 DIAGNOSIS — Z1231 Encounter for screening mammogram for malignant neoplasm of breast: Secondary | ICD-10-CM

## 2015-09-08 MED ORDER — NITROGLYCERIN 0.4 MG SL SUBL: 0.4000 mg | SUBLINGUAL_TABLET | SUBLINGUAL | Status: DC | PRN

## 2015-09-08 NOTE — Progress Notes (Signed)
Cardiology Office Note  Date: 09/08/2015   ID: [name], DOB 06/22/1961, MRN [id number]  PCP: Bennie Pierini, FNP  Primary Cardiologist: Nona Dell, MD   Chief Complaint  Patient presents with  . ER follow-up  . Coronary Artery Disease    History of Present Illness: [name] is a 54 y.o. female last seen in May. Record review finds recent ER visit secondary to chest pain. She was evaluated by Dr. Bebe Shaggy. Cardiac enzymes were found to be normal at that time and ECG showed sinus rhythm with old right bundle-branch block/IVCD.  She is here today with her husband for a follow-up visit. She relates being under a lot of psychosocial stress and thinks that this is the route of some of her chest discomfort. She does take a medication for reflux. Does not have nitroglycerin available at home.  Ischemic testing from May of this year is outlined below, low risk. We reviewed these results again today. She does not report any progressing pattern in her chest pain since this evaluation.  Continues to have trouble with chronic leg edema and lymphedema. She is on chronic diuretic therapy.  Lipid panel from earlier in the summer is reviewed below showing aggressive LDL control on Crestor.   Past Medical History  Diagnosis Date  . GERD (gastroesophageal reflux disease)   . Hyperlipidemia   . Noncompliance   . CAD (coronary artery disease)     BMS to circumflex 2007 - Dr. Juanda Chance  . Morbid obesity (HCC)   . Type 2 diabetes mellitus (HCC)   . Essential hypertension   . HA (headache)   . Left-sided face pain   . OSA (obstructive sleep apnea)     Uses CPAP  . CKD (chronic kidney disease) stage 3, GFR 30-59 ml/min   . Urinary incontinence   . Arthritis   . Falls frequently     Past Surgical History  Procedure Laterality Date  . Cholecystectomy    . Abdominal hysterectomy    . Colonoscopy N/A 04/10/2013    Procedure: COLONOSCOPY;  Surgeon: Malissa Hippo, MD;   [facility];  Service: Endoscopy;  Laterality: N/A;  . Cataract surgery Bilateral   . Tubal ligation    . Abdominal hernia repair    . Breast surgery      biopsy per right breast; pt states has silver piece in for marking   . Lumbar laminectomy/decompression microdiscectomy N/A 04/25/2015    Procedure: CENTRAL LUMBAR /DECOMPRESSION L4-L5;  Surgeon: Ranee Gosselin, MD;  Location: [facility];  Service: Orthopedics;  Laterality: N/A;  . Back surgery      Current Outpatient Prescriptions  Medication Sig Dispense Refill  . aspirin EC 325 MG tablet Take 325 mg by mouth every morning.     . furosemide (LASIX) 80 MG tablet Take 1 tablet (80 mg total) by mouth 2 (two) times daily. 60 tablet 3  . gabapentin (NEURONTIN) 300 MG capsule TAKE ONE CAPSULE BY MOUTH THREE TIMES DAILY 90 capsule 2  . glucose blood (ACCU-CHEK AVIVA PLUS) test strip Use aTest blood sugar once daily 100 each 2  . metFORMIN (GLUCOPHAGE) 500 MG tablet Take 1 tablet (500 mg total) by mouth every morning. 30 tablet 5  . omeprazole (PRILOSEC) 40 MG capsule Take 1 capsule (40 mg total) by mouth daily at 3 pm. 30 capsule 5  . oxyCODONE-acetaminophen (PERCOCET/ROXICET) 5-325 MG tablet Take 1 tablet by mouth every 8 (eight) hours as needed for moderate pain or  severe pain.    . rosuvastatin (CRESTOR) 20 MG tablet Take 1 tablet (20 mg total) by mouth at bedtime. 30 tablet 5  . nitroGLYCERIN (NITROSTAT) 0.4 MG SL tablet Place 1 tablet (0.4 mg total) under the tongue every 5 (five) minutes x 3 doses as needed for chest pain. If no relief after the 3rd dose, proceed to the ED for an evaluaton 90 tablet 3   No current facility-administered medications for this visit.    Allergies:  Morphine and related   Social History: The patient  reports that she has been smoking Cigarettes.  She has a 34 pack-year smoking history. She has never used smokeless tobacco. She reports that she does not drink alcohol or use illicit drugs.   ROS:   Please see the history of present illness. Otherwise, complete review of systems is positive for limited ambulation, uses a cane. Anxiety. All other systems are reviewed and negative.   Physical Exam: VS:  BP 100/68 mmHg  Pulse 83  Ht  (1.575 m)  Wt 317 lb 6.4 oz (143.972 kg)  BMI 58.04 kg/m2  SpO2 99%, BMI Body mass index is 58.04 kg/(m^2).  Wt Readings from Last 3 Encounters:  09/08/15 317 lb 6.4 oz (143.972 kg)  08/22/15 299 lb (135.626 kg)  05/08/15 307 lb 9.6 oz (139.526 kg)     General: Morbidly obese woman, appears comfortable at rest. HEENT: Conjunctiva and lids normal, oropharynx clear with poor dentition. Neck: Supple, no elevated JVP or carotid bruits, no thyromegaly. Lungs: Decreased breath sounds without wheezing, nonlabored breathing at rest. Cardiac: Regular rate and rhythm, no S3 or significant systolic murmur, no pericardial rub. Abdomen: Soft, nontender, bowel sounds present, no guarding or rebound. Extremities: Lymphedema and chronic appearing venous stasis, distal pulses 1+. Skin: Warm and dry. Musculoskeletal: No kyphosis. Neuropsychiatric: Alert and oriented x3, affect grossly appropriate.   ECG: ECG is not ordered today.   Recent Labwork: 05/08/2015: ALT 6; AST 12 08/22/2015: BUN 15; Creatinine, Ser 1.34*; Hemoglobin 12.9; Platelets 140*; Potassium 3.0*; Sodium 139     Component Value Date/Time   CHOL 129 05/08/2015 1559   CHOL 127 12/30/2014 1537   TRIG 236* 05/08/2015 1559   TRIG 211* 12/30/2014 1537   HDL 38* 05/08/2015 1559   HDL 48 12/30/2014 1537   HDL 44 04/09/2013 1135   CHOLHDL 3.4 05/08/2015 1559   CHOLHDL 4.5 04/09/2013 1135   VLDL 37 04/09/2013 1135   LDLCALC 44 05/08/2015 1559   LDLCALC 42 08/28/2014 1037   LDLCALC 115* 04/09/2013 1135    Other Studies Reviewed Today:  Lexiscan Cardiolite 5/44/2016:  There was no ST segment deviation noted during stress.  Defect 1: There is a medium defect of moderate severity present in  the mid anterior and apical anterior location.  This is a low risk study.  Fixed anterior defect as outlined, most consistent with breast attenuation. LVEF 72%.   Chest x-ray 08/22/2015: FINDINGS: The heart size and mediastinal contours are within normal limits. Both lungs are clear. The visualized skeletal structures are unremarkable.  IMPRESSION: No active cardiopulmonary disease.   Assessment and Plan:  1. CAD status post BMS to circumflex in 2007. Recent ischemic testing from May of this year was low risk showing no definite ischemia and LVEF 72%. Plan is to continue medical therapy.  2. Chronic leg edema with lymphedema and venous stasis. She is on diuretic therapy. This can be intensified intermittently as tolerated, also discussed elevating her legs when able. She might  benefit from a referral to the wound clinic for physical therapy in an attempt to improve her lymphedema.  3. Essential hypertension, blood pressure is normal today.  4. Hyperlipidemia, on statin therapy, with aggressive LDL control.  Current medicines were reviewed with the patient today.  Disposition: FU with me in 6 months.   Signed, Jonelle SidleSamuel G. Arsen Mangione, MD, [facility] CenterFACC 09/08/2015 1:34 PM    [facility] [address], [place], [place] [address] Phone: [phone]) [phone]; Fax: [phone]

## 2015-09-08 NOTE — Patient Instructions (Signed)
Your physician has recommended you make the following change in your medication:  You have been given a prescription for nitroglycerin to be used as needed for severe chest pain one tablet sublingual every 5 minutes up to three doses. If no relief after the 3rd dose, proceed to the ED for an evaluation. Continue all other medications the same. Your physician recommends that you schedule a follow-up appointment in: 6 months. You will receive a reminder letter in the mail in about 4 months reminding you to call and schedule your appointment. If you don't receive this letter, please contact our office.

## 2015-09-09 DIAGNOSIS — E662 Morbid (severe) obesity with alveolar hypoventilation: Secondary | ICD-10-CM | POA: Diagnosis not present

## 2015-09-11 ENCOUNTER — Encounter: Payer: Self-pay | Admitting: Nurse Practitioner

## 2015-09-11 ENCOUNTER — Ambulatory Visit (INDEPENDENT_AMBULATORY_CARE_PROVIDER_SITE_OTHER): Payer: Medicare Other | Admitting: Nurse Practitioner

## 2015-09-11 VITALS — BP 137/78 | HR 84 | Temp 96.8°F | Ht 62.0 in | Wt 317.0 lb

## 2015-09-11 DIAGNOSIS — F411 Generalized anxiety disorder: Secondary | ICD-10-CM | POA: Diagnosis not present

## 2015-09-11 DIAGNOSIS — E785 Hyperlipidemia, unspecified: Secondary | ICD-10-CM

## 2015-09-11 DIAGNOSIS — R609 Edema, unspecified: Secondary | ICD-10-CM

## 2015-09-11 DIAGNOSIS — E1142 Type 2 diabetes mellitus with diabetic polyneuropathy: Secondary | ICD-10-CM

## 2015-09-11 DIAGNOSIS — E1159 Type 2 diabetes mellitus with other circulatory complications: Secondary | ICD-10-CM | POA: Insufficient documentation

## 2015-09-11 DIAGNOSIS — I10 Essential (primary) hypertension: Secondary | ICD-10-CM | POA: Diagnosis not present

## 2015-09-11 DIAGNOSIS — Z1159 Encounter for screening for other viral diseases: Secondary | ICD-10-CM

## 2015-09-11 DIAGNOSIS — Z7689 Persons encountering health services in other specified circumstances: Secondary | ICD-10-CM | POA: Diagnosis not present

## 2015-09-11 DIAGNOSIS — K219 Gastro-esophageal reflux disease without esophagitis: Secondary | ICD-10-CM | POA: Diagnosis not present

## 2015-09-11 DIAGNOSIS — I25119 Atherosclerotic heart disease of native coronary artery with unspecified angina pectoris: Secondary | ICD-10-CM | POA: Diagnosis not present

## 2015-09-11 DIAGNOSIS — G4700 Insomnia, unspecified: Secondary | ICD-10-CM

## 2015-09-11 DIAGNOSIS — F17200 Nicotine dependence, unspecified, uncomplicated: Secondary | ICD-10-CM

## 2015-09-11 LAB — POCT GLYCOSYLATED HEMOGLOBIN (HGB A1C): Hemoglobin A1C: 6.4

## 2015-09-11 MED ORDER — OMEPRAZOLE 40 MG PO CPDR: 40.0000 mg | DELAYED_RELEASE_CAPSULE | Freq: Every day | ORAL | Status: DC

## 2015-09-11 MED ORDER — ROSUVASTATIN CALCIUM 20 MG PO TABS: 20.0000 mg | ORAL_TABLET | Freq: Every day | ORAL | Status: DC

## 2015-09-11 MED ORDER — GLUCOSE BLOOD VI STRP: ORAL_STRIP | Status: DC

## 2015-09-11 MED ORDER — FUROSEMIDE 80 MG PO TABS: 80.0000 mg | ORAL_TABLET | Freq: Two times a day (BID) | ORAL | Status: DC

## 2015-09-11 MED ORDER — METFORMIN HCL 500 MG PO TABS: 500.0000 mg | ORAL_TABLET | Freq: Every morning | ORAL | Status: DC

## 2015-09-11 MED ORDER — GABAPENTIN 300 MG PO CAPS: 300.0000 mg | ORAL_CAPSULE | Freq: Three times a day (TID) | ORAL | Status: DC

## 2015-09-11 NOTE — Progress Notes (Signed)
Subjective:    Patient ID: [name], female    DOB: 1961-01-30, 54 y.o.   MRN: [id number]  Patient here today for follow up of chronic medical problems.  No complaints today.   Diabetes She presents for her follow-up diabetic visit. She has type 2 diabetes mellitus. No MedicAlert identification noted. Her disease course has been stable. There are no hypoglycemic associated symptoms. Associated symptoms include fatigue and visual change. Risk factors for coronary artery disease include diabetes mellitus, dyslipidemia, hypertension, sedentary lifestyle and post-menopausal. When asked about current treatments, none were reported. She is compliant with treatment most of the time. Her weight is stable. She is following a diabetic diet. She has not had a previous visit with a dietitian. There is no change in her home blood glucose trend. Her breakfast blood glucose is taken between 8-9 am. Her breakfast blood glucose range is generally 130-140 mg/dl. Her overall blood glucose range is 130-140 mg/dl. An ACE inhibitor/angiotensin II receptor blocker is being taken. She does not see a podiatrist.Eye exam is not current.  Hyperlipidemia This is a chronic problem. The current episode started more than 1 year ago. The problem is uncontrolled. Recent lipid tests were reviewed and are normal. Exacerbating diseases include diabetes and obesity. She has no history of hypothyroidism. Associated symptoms include shortness of breath. Current antihyperlipidemic treatment includes statins. The current treatment provides moderate improvement of lipids. Compliance problems include adherence to diet and adherence to exercise.  Risk factors for coronary artery disease include diabetes mellitus, dyslipidemia, family history, obesity, hypertension, post-menopausal and a sedentary lifestyle.  Peripheral Neuropathy  Patient is on neuropathy and says that her feet are still burning bad- Leg pain & Spasm /arthritis Pt taking  flexeril-Pt states it helps- Has to walk with a cane in order to keep from falling. Wants something for pain GERD Patient  Only taking TUMS and zantac- says it is daily- OTC meds not helping Peripheral edema Swelling of bil lower ext has worsened over the last month. Taking lasix daily. GAD On no medds curently for this- tries stress management which helps insomnia Currently not on any meds- sleeps 4-5 hours at night- doesn't want o take anything right now.      Review of Systems  Constitutional: Positive for fatigue.  HENT: Negative.   Respiratory: Positive for shortness of breath.   Genitourinary: Negative.   Neurological: Negative.   Psychiatric/Behavioral: Negative.   All other systems reviewed and are negative.      Objective:   Physical Exam  Constitutional: She is oriented to person, place, and time. She appears well-developed and well-nourished.  HENT:  Head: Normocephalic.  Right Ear: External ear normal.  Left Ear: External ear normal.  Eyes: Pupils are equal, round, and reactive to light.  Neck: Normal range of motion. Neck supple. No thyromegaly present.  Cardiovascular: Normal rate, regular rhythm, normal heart sounds and intact distal pulses.   Pulmonary/Chest: Effort normal and breath sounds normal. No respiratory distress.  Abdominal: Soft. Bowel sounds are normal. She exhibits no distension. There is no tenderness.  Musculoskeletal: Normal range of motion. She exhibits edema (3+ edema bilaterally in feet).  Neurological: She is alert and oriented to person, place, and time.  Skin: Skin is warm and dry. No rash noted. No erythema. No pallor.  Psychiatric: She has a normal mood and affect. Her behavior is normal. Judgment and thought content normal.  Vitals reviewed.  BP 137/78 mmHg  Pulse 84  Temp(Src) 96.8 F (36 C) (  Oral)  Ht '5\' 2"'  (1.575 m)  Wt 317 lb (143.79 kg)  BMI 57.97 kg/m2  Results for orders placed or performed in visit on 09/11/15  POCT  glycosylated hemoglobin (Hb A1C)  Result Value Ref Range   Hemoglobin A1C 6.4        Assessment & Plan:   1. Type 2 diabetes mellitus with diabetic polyneuropathy, without long-term current use of insulin (HCC) Continue to watch carbs in diet - POCT glycosylated hemoglobin (Hb A1C) - Microalbumin, urine - metFORMIN (GLUCOPHAGE) 500 MG tablet; Take 1 tablet (500 mg total) by mouth every morning.  Dispense: 30 tablet; Refill: 5 - gabapentin (NEURONTIN) 300 MG capsule; Take 1 capsule (300 mg total) by mouth 3 (three) times daily.  Dispense: 90 capsule; Refill: 5 - glucose blood (ACCU-CHEK AVIVA PLUS) test strip; Use aTest blood sugar once daily  Dispense: 100 each; Refill: 2  2. Hyperlipidemia with target LDL less than 100 Low fta diet - Lipid panel - rosuvastatin (CRESTOR) 20 MG tablet; Take 1 tablet (20 mg total) by mouth at bedtime.  Dispense: 30 tablet; Refill: 5  3. Essential hypertension, benign Do not add salt to diet - CMP14+EGFR  4. Coronary artery disease involving native coronary artery of native heart with angina pectoris ([place])  5. Gastroesophageal reflux disease without esophagitis Avoid spicy foods Do not eat 2 hours prior to bedtime - omeprazole (PRILOSEC) 40 MG capsule; Take 1 capsule (40 mg total) by mouth daily at 3 pm.  Dispense: 30 capsule; Refill: 5  6. Generalized anxiety disorder Stress management  7. Insomnia Bedtime ritual  8. Tobacco use disorder Smoking cessation encouraged  9. Peripheral edema Elevate legs when can - furosemide (LASIX) 80 MG tablet; Take 1 tablet (80 mg total) by mouth 2 (two) times daily.  Dispense: 60 tablet; Refill: 3 - Compression stockings  10. Need for hepatitis C screening test - Hepatitis C antibody   Patient will come back and get flu shot Labs pending Health maintenance reviewed Diet and exercise encouraged Continue all meds Follow up  In 3 month   [place], FNP

## 2015-09-11 NOTE — Patient Instructions (Signed)
Health Maintenance, Female Adopting a healthy lifestyle and getting preventive care can go a long way to promote health and wellness. Talk with your health care provider about what schedule of regular examinations is right for you. This is a good chance for you to check in with your provider about disease prevention and staying healthy. In between checkups, there are plenty of things you can do on your own. Experts have done a lot of research about which lifestyle changes and preventive measures are most likely to keep you healthy. Ask your health care provider for more information. WEIGHT AND DIET  Eat a healthy diet  Be sure to include plenty of vegetables, fruits, low-fat dairy products, and lean protein.  Do not eat a lot of foods high in solid fats, added sugars, or salt.  Get regular exercise. This is one of the most important things you can do for your health.  Most adults should exercise for at least 150 minutes each week. The exercise should increase your heart rate and make you sweat (moderate-intensity exercise).  Most adults should also do strengthening exercises at least twice a week. This is in addition to the moderate-intensity exercise.  Maintain a healthy weight  Body mass index (BMI) is a measurement that can be used to identify possible weight problems. It estimates body fat based on height and weight. Your health care provider can help determine your BMI and help you achieve or maintain a healthy weight.  For females 20 years of age and older:   A BMI below 18.5 is considered underweight.  A BMI of 18.5 to 24.9 is normal.  A BMI of 25 to 29.9 is considered overweight.  A BMI of 30 and above is considered obese.  Watch levels of cholesterol and blood lipids  You should start having your blood tested for lipids and cholesterol at 56 years of age, then have this test every 5 years.  You may need to have your cholesterol levels checked more often if:  Your lipid  or cholesterol levels are high.  You are older than 56 years of age.  You are at high risk for heart disease.  CANCER SCREENING   Lung Cancer  Lung cancer screening is recommended for adults 55-80 years old who are at high risk for lung cancer because of a history of smoking.  A yearly low-dose CT scan of the lungs is recommended for people who:  Currently smoke.  Have quit within the past 15 years.  Have at least a 30-pack-year history of smoking. A pack year is smoking an average of one pack of cigarettes a day for 1 year.  Yearly screening should continue until it has been 15 years since you quit.  Yearly screening should stop if you develop a health problem that would prevent you from having lung cancer treatment.  Breast Cancer  Practice breast self-awareness. This means understanding how your breasts normally appear and feel.  It also means doing regular breast self-exams. Let your health care provider know about any changes, no matter how small.  If you are in your 20s or 30s, you should have a clinical breast exam (CBE) by a health care provider every 1-3 years as part of a regular health exam.  If you are 40 or older, have a CBE every year. Also consider having a breast X-ray (mammogram) every year.  If you have a family history of breast cancer, talk to your health care provider about genetic screening.  If you   are at high risk for breast cancer, talk to your health care provider about having an MRI and a mammogram every year.  Breast cancer gene (BRCA) assessment is recommended for women who have family members with BRCA-related cancers. BRCA-related cancers include:  Breast.  Ovarian.  Tubal.  Peritoneal cancers.  Results of the assessment will determine the need for genetic counseling and BRCA1 and BRCA2 testing. Cervical Cancer Your health care provider may recommend that you be screened regularly for cancer of the pelvic organs (ovaries, uterus, and  vagina). This screening involves a pelvic examination, including checking for microscopic changes to the surface of your cervix (Pap test). You may be encouraged to have this screening done every 3 years, beginning at age 21.  For women ages 30-65, health care providers may recommend pelvic exams and Pap testing every 3 years, or they may recommend the Pap and pelvic exam, combined with testing for human papilloma virus (HPV), every 5 years. Some types of HPV increase your risk of cervical cancer. Testing for HPV may also be done on women of any age with unclear Pap test results.  Other health care providers may not recommend any screening for nonpregnant women who are considered low risk for pelvic cancer and who do not have symptoms. Ask your health care provider if a screening pelvic exam is right for you.  If you have had past treatment for cervical cancer or a condition that could lead to cancer, you need Pap tests and screening for cancer for at least 20 years after your treatment. If Pap tests have been discontinued, your risk factors (such as having a new sexual partner) need to be reassessed to determine if screening should resume. Some women have medical problems that increase the chance of getting cervical cancer. In these cases, your health care provider may recommend more frequent screening and Pap tests. Colorectal Cancer  This type of cancer can be detected and often prevented.  Routine colorectal cancer screening usually begins at 56 years of age and continues through 56 years of age.  Your health care provider may recommend screening at an earlier age if you have risk factors for colon cancer.  Your health care provider may also recommend using home test kits to check for hidden blood in the stool.  A small camera at the end of a tube can be used to examine your colon directly (sigmoidoscopy or colonoscopy). This is done to check for the earliest forms of colorectal  cancer.  Routine screening usually begins at age 50.  Direct examination of the colon should be repeated every 5-10 years through 56 years of age. However, you may need to be screened more often if early forms of precancerous polyps or small growths are found. Skin Cancer  Check your skin from head to toe regularly.  Tell your health care provider about any new moles or changes in moles, especially if there is a change in a mole's shape or color.  Also tell your health care provider if you have a mole that is larger than the size of a pencil eraser.  Always use sunscreen. Apply sunscreen liberally and repeatedly throughout the day.  Protect yourself by wearing long sleeves, pants, a wide-brimmed hat, and sunglasses whenever you are outside. HEART DISEASE, DIABETES, AND HIGH BLOOD PRESSURE   High blood pressure causes heart disease and increases the risk of stroke. High blood pressure is more likely to develop in:  People who have blood pressure in the high end   of the normal range (130-139/85-89 mm Hg).  People who are overweight or obese.  People who are African American.  If you are 38-23 years of age, have your blood pressure checked every 3-5 years. If you are 61 years of age or older, have your blood pressure checked every year. You should have your blood pressure measured twice--once when you are at a hospital or clinic, and once when you are not at a hospital or clinic. Record the average of the two measurements. To check your blood pressure when you are not at a hospital or clinic, you can use:  An automated blood pressure machine at a pharmacy.  A home blood pressure monitor.  If you are between 45 years and 39 years old, ask your health care provider if you should take aspirin to prevent strokes.  Have regular diabetes screenings. This involves taking a blood sample to check your fasting blood sugar level.  If you are at a normal weight and have a low risk for diabetes,  have this test once every three years after 56 years of age.  If you are overweight and have a high risk for diabetes, consider being tested at a younger age or more often. PREVENTING INFECTION  Hepatitis B  If you have a higher risk for hepatitis B, you should be screened for this virus. You are considered at high risk for hepatitis B if:  You were born in a country where hepatitis B is common. Ask your health care provider which countries are considered high risk.  Your parents were born in a high-risk country, and you have not been immunized against hepatitis B (hepatitis B vaccine).  You have HIV or AIDS.  You use needles to inject street drugs.  You live with someone who has hepatitis B.  You have had sex with someone who has hepatitis B.  You get hemodialysis treatment.  You take certain medicines for conditions, including cancer, organ transplantation, and autoimmune conditions. Hepatitis C  Blood testing is recommended for:  Everyone born from [birth year] through 1967.  Anyone with known risk factors for hepatitis C. Sexually transmitted infections (STIs)  You should be screened for sexually transmitted infections (STIs) including gonorrhea and chlamydia if:  You are sexually active and are younger than 56 years of age.  You are older than 56 years of age and your health care provider tells you that you are at risk for this type of infection.  Your sexual activity has changed since you were last screened and you are at an increased risk for chlamydia or gonorrhea. Ask your health care provider if you are at risk.  If you do not have HIV, but are at risk, it may be recommended that you take a prescription medicine daily to prevent HIV infection. This is called pre-exposure prophylaxis (PrEP). You are considered at risk if:  You are sexually active and do not regularly use condoms or know the HIV status of your partner(s).  You take drugs by injection.  You are sexually  active with a partner who has HIV. Talk with your health care provider about whether you are at high risk of being infected with HIV. If you choose to begin PrEP, you should first be tested for HIV. You should then be tested every 3 months for as long as you are taking PrEP.  PREGNANCY   If you are premenopausal and you may become pregnant, ask your health care provider about preconception counseling.  If you may  become pregnant, take 400 to 800 micrograms (mcg) of folic acid every day.  If you want to prevent pregnancy, talk to your health care provider about birth control (contraception). OSTEOPOROSIS AND MENOPAUSE   Osteoporosis is a disease in which the bones lose minerals and strength with aging. This can result in serious bone fractures. Your risk for osteoporosis can be identified using a bone density scan.  If you are 61 years of age or older, or if you are at risk for osteoporosis and fractures, ask your health care provider if you should be screened.  Ask your health care provider whether you should take a calcium or vitamin D supplement to lower your risk for osteoporosis.  Menopause may have certain physical symptoms and risks.  Hormone replacement therapy may reduce some of these symptoms and risks. Talk to your health care provider about whether hormone replacement therapy is right for you.  HOME CARE INSTRUCTIONS   Schedule regular health, dental, and eye exams.  Stay current with your immunizations.   Do not use any tobacco products including cigarettes, chewing tobacco, or electronic cigarettes.  If you are pregnant, do not drink alcohol.  If you are breastfeeding, limit how much and how often you drink alcohol.  Limit alcohol intake to no more than 1 drink per day for nonpregnant women. One drink equals 12 ounces of beer, 5 ounces of wine, or 1 ounces of hard liquor.  Do not use street drugs.  Do not share needles.  Ask your health care provider for help if  you need support or information about quitting drugs.  Tell your health care provider if you often feel depressed.  Tell your health care provider if you have ever been abused or do not feel safe at home.   This information is not intended to replace advice given to you by your health care provider. Make sure you discuss any questions you have with your health care provider.   Document Released: 05/10/2011 Document Revised: 11/15/2014 Document Reviewed: 09/26/2013 Elsevier Interactive Patient Education [employer].

## 2015-09-12 LAB — CMP14+EGFR
ALT: 12 IU/L (ref 0–32)
AST: 22 IU/L (ref 0–40)
Albumin/Globulin Ratio: 2 (ref 1.1–2.5)
Albumin: 4.1 g/dL (ref 3.5–5.5)
Alkaline Phosphatase: 88 IU/L (ref 39–117)
BUN/Creatinine Ratio: 6 — ABNORMAL LOW (ref 9–23)
BUN: 8 mg/dL (ref 6–24)
Bilirubin Total: 0.2 mg/dL (ref 0.0–1.2)
CO2: 25 mmol/L (ref 18–29)
Calcium: 9.1 mg/dL (ref 8.7–10.2)
Chloride: 98 mmol/L (ref 97–106)
Creatinine, Ser: 1.27 mg/dL — ABNORMAL HIGH (ref 0.57–1.00)
GFR calc Af Amer: 55 mL/min/{1.73_m2} — ABNORMAL LOW (ref >59)
GFR calc non Af Amer: 47 mL/min/{1.73_m2} — ABNORMAL LOW (ref >59)
Globulin, Total: 2.1 g/dL (ref 1.5–4.5)
Glucose: 137 mg/dL — ABNORMAL HIGH (ref 65–99)
Potassium: 3.6 mmol/L (ref 3.5–5.2)
Sodium: 139 mmol/L (ref 136–144)
Total Protein: 6.2 g/dL (ref 6.0–8.5)

## 2015-09-12 LAB — MICROALBUMIN, URINE: Microalbumin, Urine: 5.8 ug/mL

## 2015-09-12 LAB — LIPID PANEL
Chol/HDL Ratio: 2.5 ratio units (ref 0.0–4.4)
Cholesterol, Total: 106 mg/dL (ref 100–199)
HDL: 42 mg/dL (ref >39)
LDL Calculated: 39 mg/dL (ref 0–99)
Triglycerides: 124 mg/dL (ref 0–149)
VLDL Cholesterol Cal: 25 mg/dL (ref 5–40)

## 2015-09-13 LAB — HEPATITIS C ANTIBODY: Hep C Virus Ab: <0.1 s/co ratio (ref 0.0–0.9)

## 2015-09-13 LAB — SPECIMEN STATUS REPORT

## 2015-09-23 LAB — NM MYOCAR MULTI W/SPECT W/WALL MOTION / EF
LV dias vol: 89 mL
LV sys vol: 24 mL
Nuc Stress EF: 72 %
Peak HR: 111 {beats}/min
Rest HR: 73 {beats}/min
SDS: 2
SRS: 5
SSS: 7
TID: 0.87

## 2015-09-25 ENCOUNTER — Ambulatory Visit: Payer: Medicare Other

## 2015-09-25 ENCOUNTER — Ambulatory Visit (INDEPENDENT_AMBULATORY_CARE_PROVIDER_SITE_OTHER): Payer: Medicare Other

## 2015-09-25 DIAGNOSIS — M5416 Radiculopathy, lumbar region: Secondary | ICD-10-CM | POA: Diagnosis not present

## 2015-09-25 DIAGNOSIS — Z23 Encounter for immunization: Secondary | ICD-10-CM

## 2015-09-25 DIAGNOSIS — M4806 Spinal stenosis, lumbar region: Secondary | ICD-10-CM | POA: Diagnosis not present

## 2015-09-29 ENCOUNTER — Ambulatory Visit: Payer: Medicaid Other | Admitting: Cardiology

## 2015-09-29 ENCOUNTER — Ambulatory Visit (HOSPITAL_COMMUNITY): Payer: Medicare Other

## 2015-10-01 DIAGNOSIS — G4733 Obstructive sleep apnea (adult) (pediatric): Secondary | ICD-10-CM | POA: Diagnosis not present

## 2015-10-01 DIAGNOSIS — R32 Unspecified urinary incontinence: Secondary | ICD-10-CM | POA: Diagnosis not present

## 2015-10-01 DIAGNOSIS — R0902 Hypoxemia: Secondary | ICD-10-CM | POA: Diagnosis not present

## 2015-10-06 ENCOUNTER — Other Ambulatory Visit (HOSPITAL_COMMUNITY): Payer: Self-pay | Admitting: Orthopedic Surgery

## 2015-10-06 ENCOUNTER — Ambulatory Visit (INDEPENDENT_AMBULATORY_CARE_PROVIDER_SITE_OTHER): Payer: Medicare Other | Admitting: Neurology

## 2015-10-06 ENCOUNTER — Encounter: Payer: Self-pay | Admitting: Neurology

## 2015-10-06 VITALS — BP 130/78 | HR 86 | Resp 20 | Ht 62.0 in | Wt 316.0 lb

## 2015-10-06 DIAGNOSIS — Z9114 Patient's other noncompliance with medication regimen: Secondary | ICD-10-CM | POA: Diagnosis not present

## 2015-10-06 DIAGNOSIS — R208 Other disturbances of skin sensation: Secondary | ICD-10-CM | POA: Diagnosis not present

## 2015-10-06 DIAGNOSIS — M48061 Spinal stenosis, lumbar region without neurogenic claudication: Secondary | ICD-10-CM

## 2015-10-06 DIAGNOSIS — E662 Morbid (severe) obesity with alveolar hypoventilation: Secondary | ICD-10-CM

## 2015-10-06 DIAGNOSIS — H9312 Tinnitus, left ear: Secondary | ICD-10-CM | POA: Diagnosis not present

## 2015-10-06 DIAGNOSIS — R200 Anesthesia of skin: Secondary | ICD-10-CM

## 2015-10-06 NOTE — Patient Instructions (Signed)
Smoking Cessation, Tips for Success If you are ready to quit smoking, congratulations! You have chosen to help yourself be healthier. Cigarettes bring nicotine, tar, carbon monoxide, and other irritants into your body. Your lungs, heart, and blood vessels will be able to work better without these poisons. There are many different ways to quit smoking. Nicotine gum, nicotine patches, a nicotine inhaler, or nicotine nasal spray can help with physical craving. Hypnosis, support groups, and medicines help break the habit of smoking. WHAT THINGS CAN I DO TO MAKE QUITTING EASIER?  Here are some tips to help you quit for good:  Pick a date when you will quit smoking completely. Tell all of your friends and family about your plan to quit on that date.  Do not try to slowly cut down on the number of cigarettes you are smoking. Pick a quit date and quit smoking completely starting on that day.  Throw away all cigarettes.   Clean and remove all ashtrays from your home, work, and car.  On a card, write down your reasons for quitting. Carry the card with you and read it when you get the urge to smoke.  Cleanse your body of nicotine. Drink enough water and fluids to keep your urine clear or pale yellow. Do this after quitting to flush the nicotine from your body.  Learn to predict your moods. Do not let a bad situation be your excuse to have a cigarette. Some situations in your life might tempt you into wanting a cigarette.  Never have "just one" cigarette. It leads to wanting another and another. Remind yourself of your decision to quit.  Change habits associated with smoking. If you smoked while driving or when feeling stressed, try other activities to replace smoking. Stand up when drinking your coffee. Brush your teeth after eating. Sit in a different chair when you read the paper. Avoid alcohol while trying to quit, and try to drink fewer caffeinated beverages. Alcohol and caffeine may urge you to  smoke.  Avoid foods and drinks that can trigger a desire to smoke, such as sugary or spicy foods and alcohol.  Ask people who smoke not to smoke around you.  Have something planned to do right after eating or having a cup of coffee. For example, plan to take a walk or exercise.  Try a relaxation exercise to calm you down and decrease your stress. Remember, you may be tense and nervous for the first 2 weeks after you quit, but this will pass.  Find new activities to keep your hands busy. Play with a pen, coin, or rubber band. Doodle or draw things on paper.  Brush your teeth right after eating. This will help cut down on the craving for the taste of tobacco after meals. You can also try mouthwash.   Use oral substitutes in place of cigarettes. Try using lemon drops, carrots, cinnamon sticks, or chewing gum. Keep them handy so they are available when you have the urge to smoke.  When you have the urge to smoke, try deep breathing.  Designate your home as a nonsmoking area.  If you are a heavy smoker, ask your health care provider about a prescription for nicotine chewing gum. It can ease your withdrawal from nicotine.  Reward yourself. Set aside the cigarette money you save and buy yourself something nice.  Look for support from others. Join a support group or smoking cessation program. Ask someone at home or at work to help you with your plan   to quit smoking.  Always ask yourself, "Do I need this cigarette or is this just a reflex?" Tell yourself, "Today, I choose not to smoke," or "I do not want to smoke." You are reminding yourself of your decision to quit.  Do not replace cigarette smoking with electronic cigarettes (commonly called e-cigarettes). The safety of e-cigarettes is unknown, and some may contain harmful chemicals.  If you relapse, do not give up! Plan ahead and think about what you will do the next time you get the urge to smoke. HOW WILL I FEEL WHEN I QUIT SMOKING? You  may have symptoms of withdrawal because your body is used to nicotine (the addictive substance in cigarettes). You may crave cigarettes, be irritable, feel very hungry, cough often, get headaches, or have difficulty concentrating. The withdrawal symptoms are only temporary. They are strongest when you first quit but will go away within 10-14 days. When withdrawal symptoms occur, stay in control. Think about your reasons for quitting. Remind yourself that these are signs that your body is healing and getting used to being without cigarettes. Remember that withdrawal symptoms are easier to treat than the major diseases that smoking can cause.  Even after the withdrawal is over, expect periodic urges to smoke. However, these cravings are generally short lived and will go away whether you smoke or not. Do not smoke! WHAT RESOURCES ARE AVAILABLE TO HELP ME QUIT SMOKING? Your health care provider can direct you to community resources or hospitals for support, which may include:  Group support.  Education.  Hypnosis.  Therapy.   This information is not intended to replace advice given to you by your health care provider. Make sure you discuss any questions you have with your health care provider.   Document Released: 07/23/2004 Document Revised: 11/15/2014 Document Reviewed: 04/12/2013 Elsevier Interactive Patient Education 2016 [employer]  

## 2015-10-06 NOTE — Progress Notes (Signed)
[name]  PATIENT: [name] DOB: 11/29/1960   REASON FOR VISIT: Follow-up for history of left-sided numbness, diabetic neuropathy and obstructive sleep apnea. HISTORY FROM: Patient and daughter, OSA.   HISTORY OF PRESENT ILLNESS: 10-06-15  Ms. [name] is a 54 year old female with a history of OSA on CPAP and left sided numbness. She returns today for follow-up. She was last seen in this office by Nilda RiggsNancy Carolyn Martin NP 02/18/2015.   Regarding the Left sided numbness, acute infarct has been ruled out.  The patient had carotid dopplers and 2-D echo that was normal. She reports that the numbness has remained the same. She also has a history of diabetes and diabetic neuropathy with complaints of numbness in the feet.  She has also been having sharp pain in the temporal area bilaterally that extends across the forehead. This is intermittent and usually results in a headache but it does resolve. She will occasionally wake up with a dull morning headache. She states that her left eye will sometimes have "yellow discharge." she notices more when she wakes up from sleeping. Her PCP gave her gabapentin but she has not noticed any benefit with this medication. She also was diagnosed with OSA and was placed on Bipap- her settings were recently changed. At the last visit her compliance was not where it needed to be. She did not bring her machine today .She also uses oxygen and states that she would rather just use the oxygen rather than the CPAP.  She has been complaining of back pain and  Dr. Ciro BackerGiofree did a back surgery on  04-24-15 , she is doing a "MRI on the first Thursday next month .  She also claims the cartilage in her knees is worn out and her legs no longer support her weight. She is morbidly obese.  To undergo a knee surgery she will have to lose weight but she also is very edema to this in both lower extremities and the fluid retention could hinder her to heal. She now uses a  walker to ambulate, she has a single-point cane today.    HISTORY 08/08/13 (YY): [name] is a 54 years old right-handed Caucasian female, referred by her primary care physician Dr. Paulene FloorMary [name]  and optometrist Dr. Despina AriasYen Le for evaluation of left-sided numbness  About a month ago, she noticed sudden onset left facial numbness, also noticed numbness involving her left arm, leg, body, she denies significant weakness, she was recently diagnosed with diabetes in November 2013, she also complains of bilateral feet paresthesia, burning sensation, she has gait difficulty because of her feet pain, joints pain  MRI brain in Sep 26th 2014: showed no evidence for acute infarction,  Asymmetric left much greater than right middle ear and mastoid fluid is observed    REVIEW OF SYSTEMS: Full 14 system review of systems performed and notable only for those listed, all others are neg:  The patient endorsed on 11-20 8-16 her Epworth sleepiness scale at 12 points the fatigue severity score at 57 points, she has achiness swollen legs, unsteady gait, she relies on a cane to walk. She scored 6 points on her geriatric depression scale.  She reports that she underwent back surgery on 04/24/2015. Mrs. [name] reports that she sometimes finds his CPAP mask and attached next to her in the bed and she has no explanation of how this occurs. Her CPAP compliance cannot be established today. The CPAP machine is not available today.  ALLERGIES: Allergies  Allergen Reactions  . Morphine And Related Nausea And Vomiting    HOME MEDICATIONS: Outpatient Prescriptions Prior to Visit  Medication Sig Dispense Refill  . aspirin EC 325 MG tablet Take 325 mg by mouth every morning.     . furosemide (LASIX) 80 MG tablet Take 1 tablet (80 mg total) by mouth 2 (two) times daily. 60 tablet 3  . gabapentin (NEURONTIN) 300 MG capsule Take 1 capsule (300 mg total) by mouth 3 (three) times daily. 90 capsule 5  . glucose blood (ACCU-CHEK AVIVA  PLUS) test strip Use aTest blood sugar once daily 100 each 2  . metFORMIN (GLUCOPHAGE) 500 MG tablet Take 1 tablet (500 mg total) by mouth every morning. 30 tablet 5  . nitroGLYCERIN (NITROSTAT) 0.4 MG SL tablet Place 1 tablet (0.4 mg total) under the tongue every 5 (five) minutes x 3 doses as needed for chest pain. If no relief after the 3rd dose, proceed to the ED for an evaluaton 90 tablet 3  . omeprazole (PRILOSEC) 40 MG capsule Take 1 capsule (40 mg total) by mouth daily at 3 pm. 30 capsule 5  . rosuvastatin (CRESTOR) 20 MG tablet Take 1 tablet (20 mg total) by mouth at bedtime. 30 tablet 5  . oxyCODONE-acetaminophen (PERCOCET/ROXICET) 5-325 MG tablet Take 1 tablet by mouth every 8 (eight) hours as needed for moderate pain or severe pain.     No facility-administered medications prior to visit.    PAST MEDICAL HISTORY: Past Medical History  Diagnosis Date  . GERD (gastroesophageal reflux disease)   . Hyperlipidemia   . Noncompliance   . CAD (coronary artery disease)     BMS to circumflex 2007 - Dr. Juanda Chance  . Morbid obesity (HCC)   . Type 2 diabetes mellitus (HCC)   . Essential hypertension   . HA (headache)   . Left-sided face pain   . OSA (obstructive sleep apnea)     Uses CPAP  . CKD (chronic kidney disease) stage 3, GFR 30-59 ml/min   . Urinary incontinence   . Arthritis   . Falls frequently     PAST SURGICAL HISTORY: Past Surgical History  Procedure Laterality Date  . Cholecystectomy    . Abdominal hysterectomy    . Colonoscopy N/A 04/10/2013    Procedure: COLONOSCOPY;  Surgeon: Malissa Hippo, MD;  Location: [facility];  Service: Endoscopy;  Laterality: N/A;  . Cataract surgery Bilateral   . Tubal ligation    . Abdominal hernia repair    . Breast surgery      biopsy per right breast; pt states has silver piece in for marking   . Lumbar laminectomy/decompression microdiscectomy N/A 04/25/2015    Procedure: CENTRAL LUMBAR /DECOMPRESSION L4-L5;  Surgeon: Ranee Gosselin, MD;  Location: [facility];  Service: Orthopedics;  Laterality: N/A;  . Back surgery      FAMILY HISTORY: Family History  Problem Relation Age of Onset  . Coronary artery disease Father   . Cancer - Colon Father   . Breast cancer Mother   . Diabetes Father   . High Cholesterol Father     SOCIAL HISTORY: Social History   Social History  . Marital Status: Married    Spouse Name: [name]  . Number of Children: 5  . Years of Education: 10 th   Occupational History  .      disabled   Social History Main Topics  . Smoking status: Current Every Day Smoker -- 1.00 packs/day for 34 years  Types: Cigarettes  . Smokeless tobacco: Never Used  . Alcohol Use: No  . Drug Use: No  . Sexual Activity: No   Other Topics Concern  . Not on file   Social History Narrative   Patient lives at home with her husband and grandchild [name]). Patient is disabled.   Patient has 10 th grade education.   Right handed.   Caffeine- one  cup of coffee and  One soda Dr.Pepper/ tea. daily     PHYSICAL EXAM  Filed Vitals:   10/06/15 0915  BP: 130/78  Pulse: 86  Resp: 20  Height: 5\' 2"  (1.575 m)  Weight: 316 lb (143.337 kg)    Mrs. with upper airway is inflamed, appears puffy and swollen and very red. Mallampati is grade 3,  her neck circumference is 17-1/2 inches, The patient does not wear dentures, poor dentition.  She reports clicking and pain at the trigeminal point and at the T. mandibular jaw.   Body mass index is 57.78 kg/(m^2).   Generalized: Well developed,  morbidly obese female in no acute distress, Skin : pitting edema 2-3+ in lower extremities.   Abdominal obesity.   Neurological examination  Mentation: Alert oriented to time, place, history taking. Follows all commands speech and language fluent  Cranial nerve : Loss of smell, taste unchanged, tinnitus. Left ear hearing loss.  Pupils were equal round reactive to light. Extraocular movements were full, visual  field were full on confrontational test. Facial sensation and strength were normal. Uvula tongue midline. Head turning and shoulder shrug were normal and symmetric. Motor: The motor testing reveals 5 /5 strength of all 4 extremities.  Symmetric motor tone is noted throughout.  Sensory: Sensory testing is intact to soft touch on all 4 extremities except decreased on the left side of face and left arm. Pinprick decrease on left side of face and left arm.  Coordination: Cerebellar testing reveals intact  finger-nose-finger movements  bilaterally.  Gait and station: Walks with a cane, gait is slightly unsteady - contributed by leg swelling and knee pain.  Reflexes:Lower extremities  -Deep tendon reflexes are attenuated  bilaterally.     DIAGNOSTIC DATA (LABS, IMAGING, TESTING) - I reviewed patient records, labs, notes, testing and imaging myself where available.  Lab Results  Component Value Date   WBC 8.8 08/22/2015   HGB 12.9 08/22/2015   HCT 38.8 08/22/2015   MCV 95.3 08/22/2015   PLT 140* 08/22/2015      Component Value Date/Time   NA 139 09/11/2015 1003   NA 139 08/22/2015 2225   K 3.6 09/11/2015 1003   CL 98 09/11/2015 1003   CO2 25 09/11/2015 1003   GLUCOSE 137* 09/11/2015 1003   GLUCOSE 126* 08/22/2015 2225   BUN 8 09/11/2015 1003   BUN 15 08/22/2015 2225   CREATININE 1.27* 09/11/2015 1003   CALCIUM 9.1 09/11/2015 1003   PROT 6.2 09/11/2015 1003   PROT 7.6 04/22/2015 1125   ALBUMIN 4.1 09/11/2015 1003   ALBUMIN 4.3 04/22/2015 1125   AST 22 09/11/2015 1003   ALT 12 09/11/2015 1003   ALKPHOS 88 09/11/2015 1003   BILITOT 0.2 09/11/2015 1003   BILITOT 0.4 04/22/2015 1125   GFRNONAA 47* 09/11/2015 1003   GFRAA 55* 09/11/2015 1003   Lab Results  Component Value Date   CHOL 106 09/11/2015   HDL 42 09/11/2015   LDLCALC 39 09/11/2015   TRIG 124 09/11/2015   CHOLHDL 2.5 09/11/2015   Lab Results  Component Value Date  HGBA1C 6.4 09/11/2015    ASSESSMENT AND  PLAN  Mrs. Disbrow is seen here today in a 25 minute revisit was more than 50% of the face to face time dedicated to discussion of her underlying conditions. The discussion of compliance with CPAP in the treatment of OSA and obesity hypoventilation did not take place as I don't have access to date today. Overall the patient feels that she would rather use oxygen alone in a CPAP. It has been difficult for her to leave the CPAP interface on her face. She has a past medical history of morbid obesity, ongoing tobacco abuse.   GERD (gastroesophageal reflux disease); Hyperlipidemia; CAD (coronary artery disease); Morbid obesity; Diabetes mellitus without complication; Hypertension; HA (headache); and Left-sided face pain. here  to follow-up for OSA .  She has little understanding of diabetic neuropathy.  Her left-sided facial numbness still remains unchanged. Now unchanged for one year. She reports a bad headache and tinnitus in her left ear. She also reports that after the back surgery for while her left ear was not bothering her as much and I wonder if she had less fluid retention or if the antibiotics treated her ear symptoms. I would like for her to see an ear nose and throat physician and I will make the according referral. .  Continue aspirin daily Continue to use BiPAP at current settings Follow-up with NP in 12 months for sleep follow up  ( patient needs to bring her CPAP !!!)  With Butch Penny after that.  Use cane or walker at all times for safe for ambulation Given written information on diabetic neuropathy to include signs and symptoms and treatment Vst time 25 min   [facility] [address], [address] [place], [place] [address] [phone]

## 2015-10-09 DIAGNOSIS — E662 Morbid (severe) obesity with alveolar hypoventilation: Secondary | ICD-10-CM | POA: Diagnosis not present

## 2015-10-13 ENCOUNTER — Ambulatory Visit (HOSPITAL_COMMUNITY): Payer: Medicare Other

## 2015-10-31 DIAGNOSIS — M545 Low back pain: Secondary | ICD-10-CM | POA: Diagnosis not present

## 2015-11-09 DIAGNOSIS — E662 Morbid (severe) obesity with alveolar hypoventilation: Secondary | ICD-10-CM | POA: Diagnosis not present

## 2015-11-11 IMAGING — CT CT ABD-PELV W/O CM
2 of 4 series · 17 of 46 positions shown, 19 images · non-contrast
Comparison: 03/28/2014

CLINICAL DATA: Nausea vomiting and diarrhea.

EXAM:
CT ABDOMEN AND PELVIS WITHOUT CONTRAST
TECHNIQUE: Multidetector CT imaging of the abdomen and pelvis was performed
following the standard protocol without IV contrast.

[Series 3: abdomen/pelvis w/o contrast · axial · non-contrast · 0.84mm/px · z∈[-380,+40]mm · 14 of 92 slices shown, 16 images]
[im 4/92  soft-tissue]
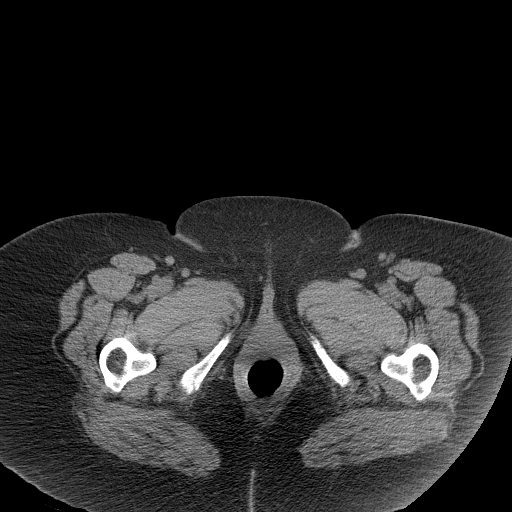
[im 4/92  bone]
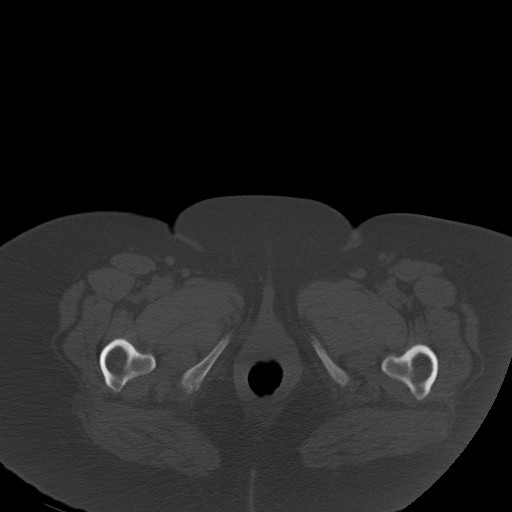
[im 12/92  soft-tissue]
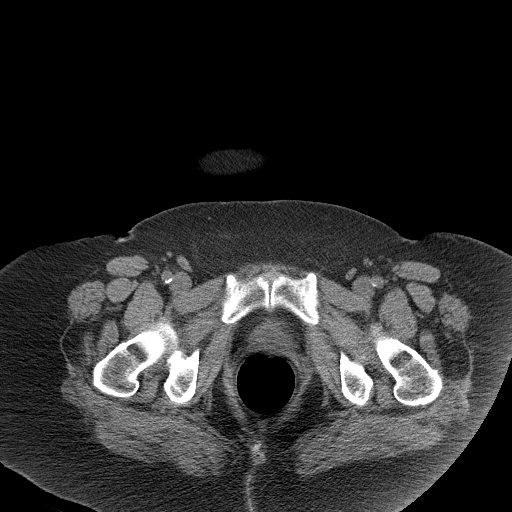
[im 19/92  soft-tissue]
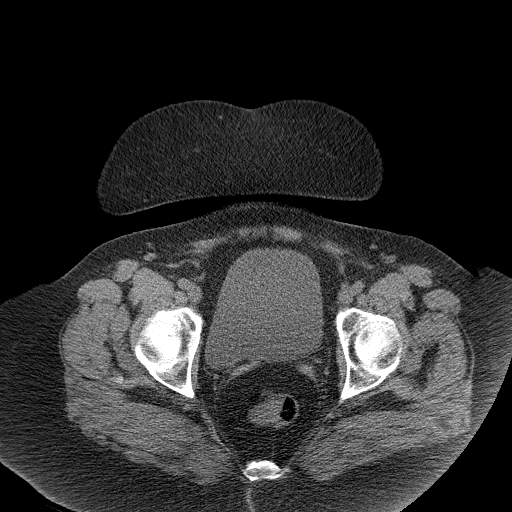
[im 23/92  soft-tissue]
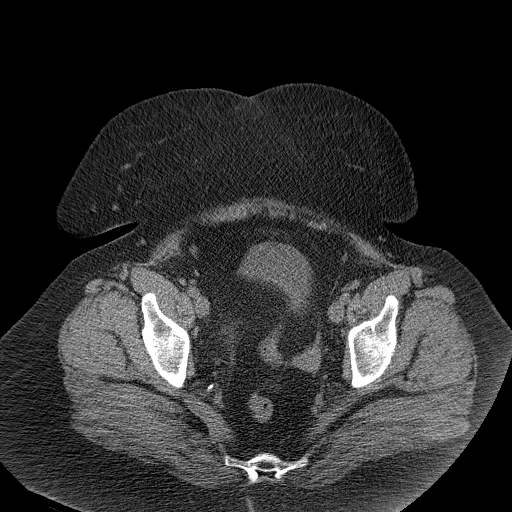
[im 31/92  soft-tissue]
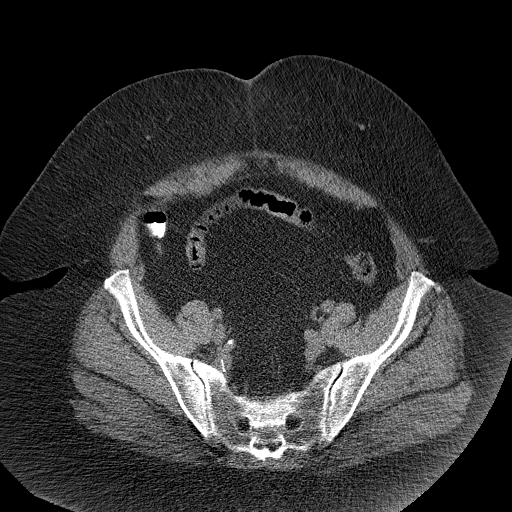
[im 38/92  soft-tissue]
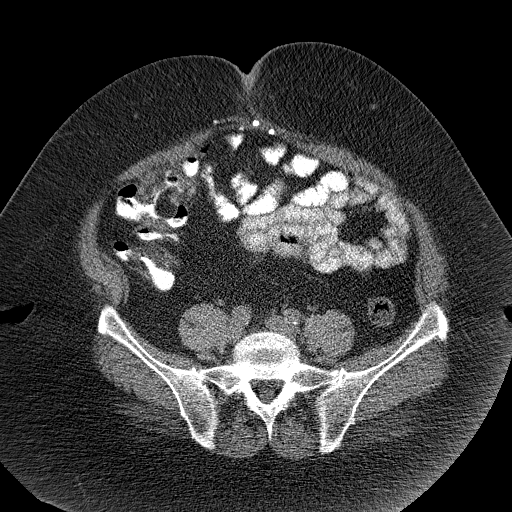
[im 42/92  soft-tissue]
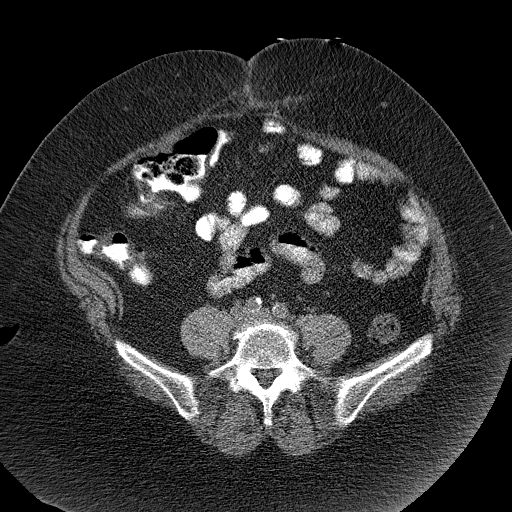
[im 50/92  soft-tissue]
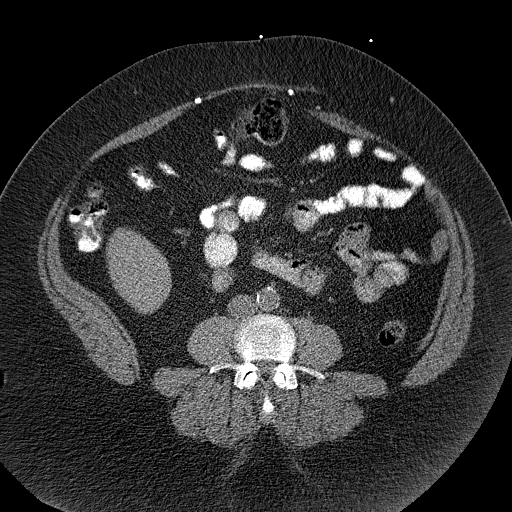
[im 54/92  soft-tissue]
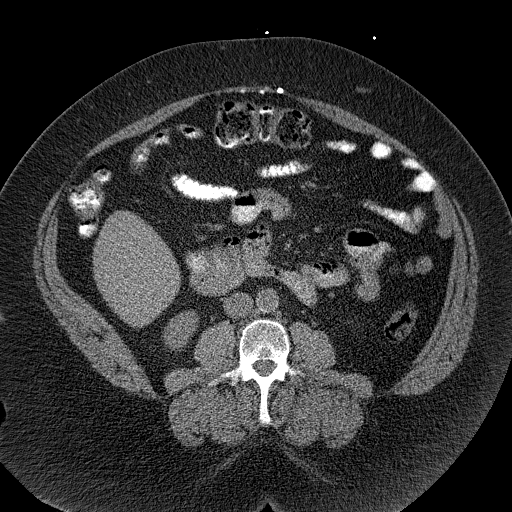
[im 54/92  bone]
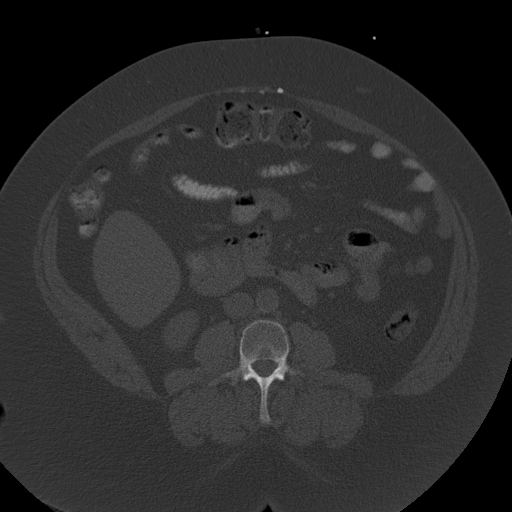
[im 61/92  soft-tissue]
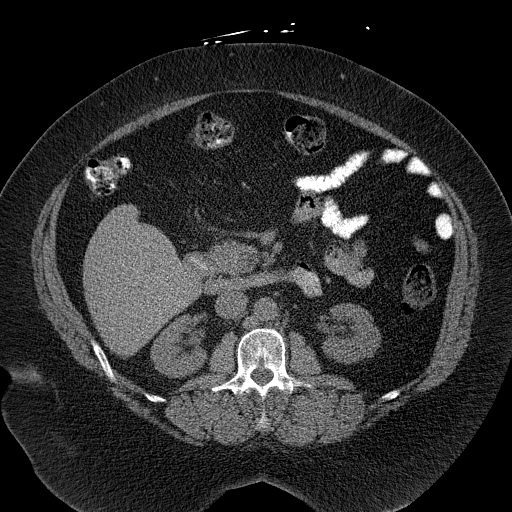
[im 69/92  soft-tissue]
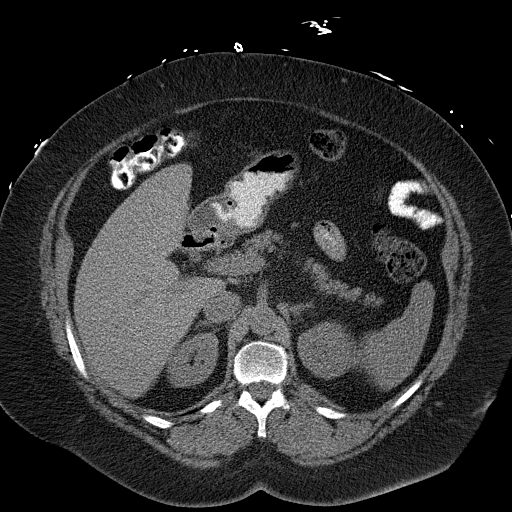
[im 73/92  soft-tissue]
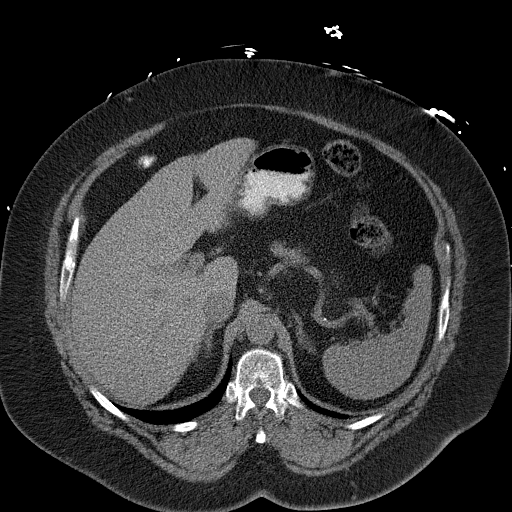
[im 80/92  soft-tissue]
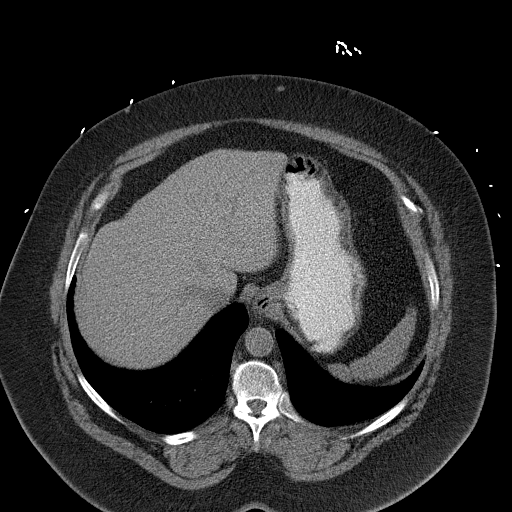
[im 88/92  soft-tissue]
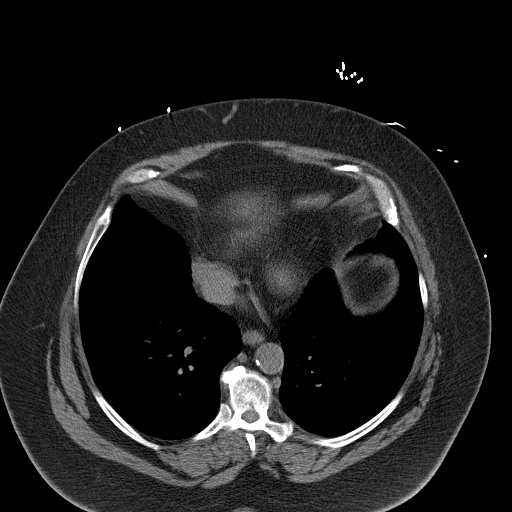

[Series 5: mpr cor (id) · coronal · 0.82mm/px · 3 of 126 slices shown]
[im 42/126  soft-tissue]
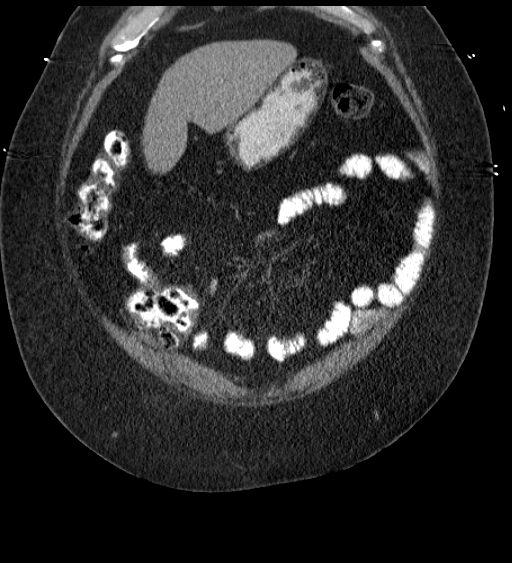
[im 56/126  soft-tissue]
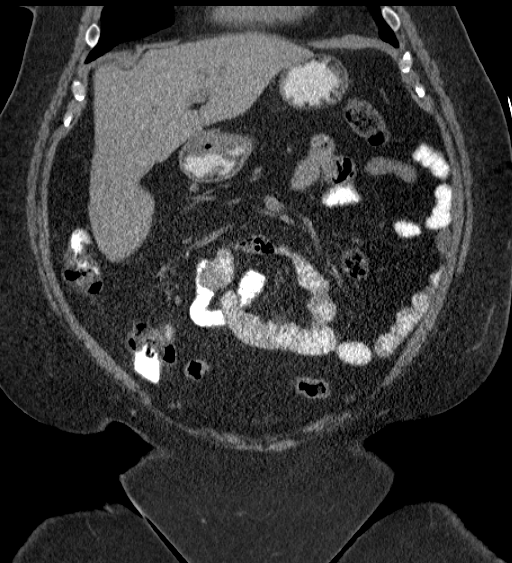
[im 70/126  soft-tissue]
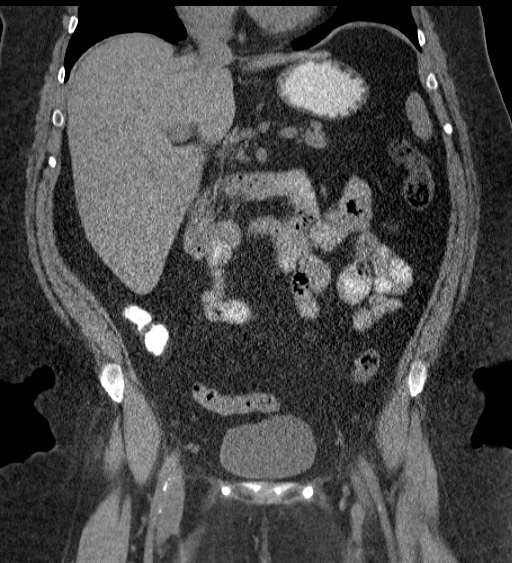

[17 of 46 positions shown; findings below may reference images not displayed]

FINDINGS: The lung bases are clear.  There is no pleural effusion or edema.

No focal liver abnormality. The patient is status post coli
cystectomy. No biliary dilatation. Normal appearance of the spleen.
The adrenal glands are both normal. The kidneys are both
unremarkable. No nephrolithiasis or obstructive uropathy. Urinary
bladder appears normal. Previous hysterectomy.

Calcified atherosclerotic disease involves the abdominal aorta. No
aneurysm. There is no upper abdominal adenopathy identified. There
is no pelvic or inguinal adenopathy.

The stomach appears normal. The small bowel loops have a normal
course and caliber without obstruction. Normal appearance of the
proximal colon. The distal colon is also normal.

The patient is status post mesh repair of ventral abdominal wall
hernia. Similar to the previous exam is recurrent ventral abdominal
wall hernia which contains fat only, image 85/series 6.

Review of the visualized osseous structures is significant for mild
lumbar degenerative disc disease.
IMPRESSION: 1. No acute findings within the abdomen or pelvis.
2. Atherosclerotic disease.
3. Status post ventral abdominal wall hernia repair. Unchanged
recurrent fat containing hernia involving the ventral abdominal
wall.

## 2015-11-26 ENCOUNTER — Other Ambulatory Visit: Payer: Self-pay | Admitting: *Deleted

## 2015-11-26 DIAGNOSIS — E785 Hyperlipidemia, unspecified: Secondary | ICD-10-CM

## 2015-11-26 DIAGNOSIS — R609 Edema, unspecified: Secondary | ICD-10-CM

## 2015-11-26 MED ORDER — ROSUVASTATIN CALCIUM 20 MG PO TABS: 20.0000 mg | ORAL_TABLET | Freq: Every day | ORAL | Status: DC

## 2015-11-26 MED ORDER — NITROGLYCERIN 0.4 MG SL SUBL: 0.4000 mg | SUBLINGUAL_TABLET | SUBLINGUAL | Status: DC | PRN

## 2015-11-26 MED ORDER — FUROSEMIDE 80 MG PO TABS: 80.0000 mg | ORAL_TABLET | Freq: Two times a day (BID) | ORAL | Status: DC

## 2015-11-27 ENCOUNTER — Telehealth: Payer: Self-pay | Admitting: *Deleted

## 2015-11-27 ENCOUNTER — Telehealth: Payer: Self-pay | Admitting: Nurse Practitioner

## 2015-11-27 DIAGNOSIS — M545 Low back pain: Secondary | ICD-10-CM | POA: Diagnosis not present

## 2015-11-27 MED ORDER — ACCU-CHEK SOFT TOUCH LANCETS MISC: Status: DC

## 2015-11-27 NOTE — Telephone Encounter (Signed)
done

## 2015-11-27 NOTE — Telephone Encounter (Signed)
Question answered. 

## 2015-11-28 ENCOUNTER — Other Ambulatory Visit: Payer: Self-pay | Admitting: *Deleted

## 2015-11-28 DIAGNOSIS — K219 Gastro-esophageal reflux disease without esophagitis: Secondary | ICD-10-CM

## 2015-11-28 DIAGNOSIS — E1142 Type 2 diabetes mellitus with diabetic polyneuropathy: Secondary | ICD-10-CM

## 2015-11-28 MED ORDER — GABAPENTIN 300 MG PO CAPS: 300.0000 mg | ORAL_CAPSULE | Freq: Three times a day (TID) | ORAL | Status: DC

## 2015-11-28 MED ORDER — OMEPRAZOLE 40 MG PO CPDR: 40.0000 mg | DELAYED_RELEASE_CAPSULE | Freq: Every day | ORAL | Status: DC

## 2015-11-28 MED ORDER — METFORMIN HCL 500 MG PO TABS: 500.0000 mg | ORAL_TABLET | Freq: Every morning | ORAL | Status: DC

## 2015-12-05 ENCOUNTER — Telehealth: Payer: Self-pay | Admitting: Nurse Practitioner

## 2015-12-05 DIAGNOSIS — E785 Hyperlipidemia, unspecified: Secondary | ICD-10-CM

## 2015-12-05 DIAGNOSIS — E1142 Type 2 diabetes mellitus with diabetic polyneuropathy: Secondary | ICD-10-CM

## 2015-12-05 DIAGNOSIS — K219 Gastro-esophageal reflux disease without esophagitis: Secondary | ICD-10-CM

## 2015-12-05 MED ORDER — METFORMIN HCL 500 MG PO TABS: 500.0000 mg | ORAL_TABLET | Freq: Every morning | ORAL | Status: DC

## 2015-12-05 MED ORDER — OMEPRAZOLE 40 MG PO CPDR: 40.0000 mg | DELAYED_RELEASE_CAPSULE | Freq: Every day | ORAL | Status: DC

## 2015-12-05 MED ORDER — ROSUVASTATIN CALCIUM 20 MG PO TABS: 30.0000 mg | ORAL_TABLET | Freq: Every day | ORAL | Status: DC

## 2015-12-05 MED ORDER — GABAPENTIN 300 MG PO CAPS: 300.0000 mg | ORAL_CAPSULE | Freq: Three times a day (TID) | ORAL | Status: DC

## 2015-12-05 NOTE — Telephone Encounter (Signed)
done

## 2015-12-10 DIAGNOSIS — E662 Morbid (severe) obesity with alveolar hypoventilation: Secondary | ICD-10-CM | POA: Diagnosis not present

## 2016-01-01 ENCOUNTER — Encounter: Payer: Self-pay | Admitting: Nurse Practitioner

## 2016-01-01 ENCOUNTER — Ambulatory Visit (INDEPENDENT_AMBULATORY_CARE_PROVIDER_SITE_OTHER): Payer: Medicare Other | Admitting: Nurse Practitioner

## 2016-01-01 VITALS — BP 134/69 | HR 69 | Temp 96.6°F | Ht 62.0 in | Wt 317.0 lb

## 2016-01-01 DIAGNOSIS — K219 Gastro-esophageal reflux disease without esophagitis: Secondary | ICD-10-CM

## 2016-01-01 DIAGNOSIS — E1142 Type 2 diabetes mellitus with diabetic polyneuropathy: Secondary | ICD-10-CM

## 2016-01-01 DIAGNOSIS — I10 Essential (primary) hypertension: Secondary | ICD-10-CM

## 2016-01-01 DIAGNOSIS — R609 Edema, unspecified: Secondary | ICD-10-CM

## 2016-01-01 DIAGNOSIS — G4700 Insomnia, unspecified: Secondary | ICD-10-CM

## 2016-01-01 DIAGNOSIS — F411 Generalized anxiety disorder: Secondary | ICD-10-CM | POA: Diagnosis not present

## 2016-01-01 DIAGNOSIS — E0843 Diabetes mellitus due to underlying condition with diabetic autonomic (poly)neuropathy: Secondary | ICD-10-CM

## 2016-01-01 DIAGNOSIS — D696 Thrombocytopenia, unspecified: Secondary | ICD-10-CM | POA: Diagnosis not present

## 2016-01-01 DIAGNOSIS — E785 Hyperlipidemia, unspecified: Secondary | ICD-10-CM

## 2016-01-01 DIAGNOSIS — I25119 Atherosclerotic heart disease of native coronary artery with unspecified angina pectoris: Secondary | ICD-10-CM

## 2016-01-01 LAB — POCT GLYCOSYLATED HEMOGLOBIN (HGB A1C): Hemoglobin A1C: 6.5

## 2016-01-01 MED ORDER — GABAPENTIN 300 MG PO CAPS: 300.0000 mg | ORAL_CAPSULE | Freq: Three times a day (TID) | ORAL | Status: DC

## 2016-01-01 MED ORDER — ONETOUCH DELICA LANCETS 33G MISC: Status: DC

## 2016-01-01 MED ORDER — OMEPRAZOLE 40 MG PO CPDR: 40.0000 mg | DELAYED_RELEASE_CAPSULE | Freq: Every day | ORAL | Status: DC

## 2016-01-01 MED ORDER — FUROSEMIDE 80 MG PO TABS: 80.0000 mg | ORAL_TABLET | Freq: Two times a day (BID) | ORAL | Status: DC

## 2016-01-01 MED ORDER — GLUCOSE BLOOD VI STRP: ORAL_STRIP | Status: DC

## 2016-01-01 MED ORDER — METFORMIN HCL 500 MG PO TABS: 500.0000 mg | ORAL_TABLET | Freq: Every morning | ORAL | Status: DC

## 2016-01-01 MED ORDER — ROSUVASTATIN CALCIUM 20 MG PO TABS: 30.0000 mg | ORAL_TABLET | Freq: Every day | ORAL | Status: DC

## 2016-01-01 NOTE — Progress Notes (Signed)
Subjective:    Patient ID: [name], female    DOB: [birth year], 55 y.o.   MRN: [id number]  Patient here today for follow up of chronic medical problems.  Outpatient Encounter Prescriptions as of 01/01/2016  Medication Sig  . aspirin EC 325 MG tablet Take 325 mg by mouth every morning.   . furosemide (LASIX) 80 MG tablet Take 1 tablet (80 mg total) by mouth 2 (two) times daily.  Marland Kitchen gabapentin (NEURONTIN) 300 MG capsule Take 1 capsule (300 mg total) by mouth 3 (three) times daily.  Marland Kitchen glucose blood (ACCU-CHEK AVIVA PLUS) test strip Use aTest blood sugar once daily  . Lancets (ACCU-CHEK SOFT TOUCH) lancets Test qd. Dx E13.10  . metFORMIN (GLUCOPHAGE) 500 MG tablet Take 1 tablet (500 mg total) by mouth every morning.  . nitroGLYCERIN (NITROSTAT) 0.4 MG SL tablet Place 1 tablet (0.4 mg total) under the tongue every 5 (five) minutes x 3 doses as needed for chest pain. If no relief after the 3rd dose, proceed to the ED for an evaluaton  . omeprazole (PRILOSEC) 40 MG capsule Take 1 capsule (40 mg total) by mouth daily at 3 pm.  . oxyCODONE-acetaminophen (PERCOCET) 10-325 MG tablet   . predniSONE (STERAPRED UNI-PAK 21 TAB) 10 MG (21) TBPK tablet   . rosuvastatin (CRESTOR) 20 MG tablet Take 1.5 tablets (30 mg total) by mouth at bedtime.   No facility-administered encounter medications on file as of 01/01/2016.     Diabetes She presents for her follow-up diabetic visit. She has type 2 diabetes mellitus. No MedicAlert identification noted. Her disease course has been stable. There are no hypoglycemic associated symptoms. Associated symptoms include fatigue and visual change. Risk factors for coronary artery disease include diabetes mellitus, dyslipidemia, hypertension, sedentary lifestyle and post-menopausal. When asked about current treatments, none were reported. She is compliant with treatment most of the time. Her weight is stable. She is following a diabetic diet. She has not had a previous visit  with a dietitian. There is no change in her home blood glucose trend. Her breakfast blood glucose is taken between 8-9 am. Her breakfast blood glucose range is generally 140-180 mg/dl. Her overall blood glucose range is 140-180 mg/dl. An ACE inhibitor/angiotensin II receptor blocker is being taken. She does not see a podiatrist.Eye exam is not current.  Hyperlipidemia This is a chronic problem. The current episode started more than 1 year ago. The problem is uncontrolled. Recent lipid tests were reviewed and are normal. Exacerbating diseases include diabetes and obesity. She has no history of hypothyroidism. Associated symptoms include shortness of breath. Current antihyperlipidemic treatment includes statins. The current treatment provides moderate improvement of lipids. Compliance problems include adherence to diet and adherence to exercise.  Risk factors for coronary artery disease include diabetes mellitus, dyslipidemia, family history, obesity, hypertension, post-menopausal and a sedentary lifestyle.  Peripheral Neuropathy  Patient is on neuropathy and says that her feet are still burning bad- Leg pain & Spasm /arthritis Pt taking flexeril-Pt states it helps- Has to walk with a cane in order to keep from falling. Wants something for pain GERD Patient  Only taking TUMS and zantac- says it is daily- OTC meds not helping Peripheral edema Swelling of bil lower ext has worsened over the last month. Taking lasix daily. GAD On no medds curently for this- tries stress management which helps insomnia Currently not on any meds- sleeps 4-5 hours at night- doesn't want o take anything right now. Hx thrombocytopenia Will recheck labs  Review of Systems  Constitutional: Positive for fatigue.  HENT: Negative.   Respiratory: Positive for shortness of breath.   Genitourinary: Negative.   Neurological: Negative.   Psychiatric/Behavioral: Negative.   All other systems reviewed and are negative.       Objective:   Physical Exam  Constitutional: She is oriented to person, place, and time. She appears well-developed and well-nourished.  HENT:  Head: Normocephalic.  Right Ear: External ear normal.  Left Ear: External ear normal.  Eyes: Pupils are equal, round, and reactive to light.  Neck: Normal range of motion. Neck supple. No thyromegaly present.  Cardiovascular: Normal rate, regular rhythm, normal heart sounds and intact distal pulses.   Pulmonary/Chest: Effort normal and breath sounds normal. No respiratory distress.  Abdominal: Soft. Bowel sounds are normal. She exhibits no distension. There is no tenderness.  Musculoskeletal: Normal range of motion. She exhibits edema (3+ edema bilaterally in feet).  Compression hose in place bil lower ext  Neurological: She is alert and oriented to person, place, and time.  Skin: Skin is warm and dry. No rash noted. No erythema. No pallor.  Psychiatric: She has a normal mood and affect. Her behavior is normal. Judgment and thought content normal.  Vitals reviewed.  BP 134/69 mmHg  Pulse 69  Temp(Src) 96.6 F (35.9 C) (Oral)  Ht '5\' 2"'$  (1.575 m)  Wt 317 lb (143.79 kg)  BMI 57.97 kg/m2  Results for orders placed or performed in visit on 01/01/16  POCT glycosylated hemoglobin (Hb A1C)  Result Value Ref Range   Hemoglobin A1C 6.5        Assessment & Plan:   1. Coronary artery disease involving native coronary artery of native heart with angina pectoris [facility]) Keep follow up with cardiologist  2. Essential hypertension, benign Do not add salt to diet - CMP14+EGFR  3. Gastroesophageal reflux disease without esophagitis Avoid spicy foods Do not eat 2 hours prior to bedtime - omeprazole (PRILOSEC) 40 MG capsule; Take 1 capsule (40 mg total) by mouth daily at 3 pm.  Dispense: 90 capsule; Refill: 1  4. Type 2 diabetes mellitus with diabetic polyneuropathy, without long-term current use of insulin (HCC) Continue to watch carbs in diet -  POCT glycosylated hemoglobin (Hb A1C) - gabapentin (NEURONTIN) 300 MG capsule; Take 1 capsule (300 mg total) by mouth 3 (three) times daily.  Dispense: 90 capsule; Refill: 5 - metFORMIN (GLUCOPHAGE) 500 MG tablet; Take 1 tablet (500 mg total) by mouth every morning.  Dispense: 90 tablet; Refill: 1 - ONETOUCH DELICA LANCETS 25D MISC; Check blood sugar BID and prn  E11.9  Dispense: 100 each; Refill: 11 - glucose blood (ONETOUCH VERIO) test strip; Test BID and prn  e11.9  Dispense: 100 each; Refill: 11  5. Diabetic autonomic neuropathy associated with diabetes mellitus due to underlying condition ([place]) Do not go barefooted  6. Hyperlipidemia with target LDL less than 100 Low fat diet - Lipid panel - rosuvastatin (CRESTOR) 20 MG tablet; Take 1.5 tablets (30 mg total) by mouth at bedtime.  Dispense: 90 tablet; Refill: 1  7. Insomnia Bedtime ritual  8. Generalized anxiety disorder Stress management  9. Morbid obesity, unspecified obesity type ([place]) Discussed diet and exercise for person with BMI >25 Will recheck weight in 3-6 months  10. Peripheral edema Elevate legs when sitting Continue to wear compression hose - furosemide (LASIX) 80 MG tablet; Take 1 tablet (80 mg total) by mouth 2 (two) times daily.  Dispense: 180 tablet; Refill: 1  11.  Thrombocytopenia, unspecified ([place]) - CBC with Differential/Platelet    Labs pending Health maintenance reviewed Diet and exercise encouraged Continue all meds Follow up  In 3 months   [place], FNP

## 2016-01-02 LAB — CMP14+EGFR
ALT: 13 IU/L (ref 0–32)
AST: 16 IU/L (ref 0–40)
Albumin/Globulin Ratio: 2.1 (ref 1.1–2.5)
Albumin: 4.4 g/dL (ref 3.5–5.5)
Alkaline Phosphatase: 98 IU/L (ref 39–117)
BUN/Creatinine Ratio: 14 (ref 9–23)
BUN: 18 mg/dL (ref 6–24)
Bilirubin Total: 0.2 mg/dL (ref 0.0–1.2)
CO2: 23 mmol/L (ref 18–29)
Calcium: 9 mg/dL (ref 8.7–10.2)
Chloride: 103 mmol/L (ref 96–106)
Creatinine, Ser: 1.32 mg/dL — ABNORMAL HIGH (ref 0.57–1.00)
GFR calc Af Amer: 52 mL/min/{1.73_m2} — ABNORMAL LOW (ref >59)
GFR calc non Af Amer: 45 mL/min/{1.73_m2} — ABNORMAL LOW (ref >59)
Globulin, Total: 2.1 g/dL (ref 1.5–4.5)
Glucose: 124 mg/dL — ABNORMAL HIGH (ref 65–99)
Potassium: 4.2 mmol/L (ref 3.5–5.2)
Sodium: 143 mmol/L (ref 134–144)
Total Protein: 6.5 g/dL (ref 6.0–8.5)

## 2016-01-02 LAB — LIPID PANEL
Chol/HDL Ratio: 2.8 ratio units (ref 0.0–4.4)
Cholesterol, Total: 117 mg/dL (ref 100–199)
HDL: 42 mg/dL (ref >39)
LDL Calculated: 39 mg/dL (ref 0–99)
Triglycerides: 179 mg/dL — ABNORMAL HIGH (ref 0–149)
VLDL Cholesterol Cal: 36 mg/dL (ref 5–40)

## 2016-01-02 LAB — CBC WITH DIFFERENTIAL/PLATELET
Basophils Absolute: 0.1 10*3/uL (ref 0.0–0.2)
Basos: 1 %
EOS (ABSOLUTE): 0.3 10*3/uL (ref 0.0–0.4)
Eos: 5 %
Hematocrit: 38.7 % (ref 34.0–46.6)
Hemoglobin: 12.4 g/dL (ref 11.1–15.9)
Immature Grans (Abs): 0 10*3/uL (ref 0.0–0.1)
Immature Granulocytes: 0 %
Lymphocytes Absolute: 2.8 10*3/uL (ref 0.7–3.1)
Lymphs: 39 %
MCH: 30.6 pg (ref 26.6–33.0)
MCHC: 32 g/dL (ref 31.5–35.7)
MCV: 96 fL (ref 79–97)
Monocytes Absolute: 0.4 10*3/uL (ref 0.1–0.9)
Monocytes: 6 %
Neutrophils Absolute: 3.4 10*3/uL (ref 1.4–7.0)
Neutrophils: 49 %
Platelets: 128 10*3/uL — ABNORMAL LOW (ref 150–379)
RBC: 4.05 x10E6/uL (ref 3.77–5.28)
RDW: 15.8 % — ABNORMAL HIGH (ref 12.3–15.4)
WBC: 7 10*3/uL (ref 3.4–10.8)

## 2016-01-07 DIAGNOSIS — E662 Morbid (severe) obesity with alveolar hypoventilation: Secondary | ICD-10-CM | POA: Diagnosis not present

## 2016-01-14 ENCOUNTER — Other Ambulatory Visit: Payer: Self-pay | Admitting: Cardiology

## 2016-01-27 ENCOUNTER — Other Ambulatory Visit: Payer: Self-pay | Admitting: Nurse Practitioner

## 2016-01-27 DIAGNOSIS — Z1231 Encounter for screening mammogram for malignant neoplasm of breast: Secondary | ICD-10-CM

## 2016-01-30 ENCOUNTER — Ambulatory Visit (HOSPITAL_COMMUNITY)
Admission: RE | Admit: 2016-01-30 | Discharge: 2016-01-30 | Disposition: A | Discharge status: Home / Self Care | Payer: Medicare Other | Source: Ambulatory Visit | Attending: Nurse Practitioner | Admitting: Nurse Practitioner

## 2016-01-30 DIAGNOSIS — R32 Unspecified urinary incontinence: Secondary | ICD-10-CM | POA: Diagnosis not present

## 2016-01-30 DIAGNOSIS — R0902 Hypoxemia: Secondary | ICD-10-CM | POA: Diagnosis not present

## 2016-01-30 DIAGNOSIS — Z1231 Encounter for screening mammogram for malignant neoplasm of breast: Secondary | ICD-10-CM | POA: Diagnosis not present

## 2016-01-31 DIAGNOSIS — M4806 Spinal stenosis, lumbar region: Secondary | ICD-10-CM | POA: Diagnosis not present

## 2016-02-07 DIAGNOSIS — E662 Morbid (severe) obesity with alveolar hypoventilation: Secondary | ICD-10-CM | POA: Diagnosis not present

## 2016-03-08 DIAGNOSIS — E662 Morbid (severe) obesity with alveolar hypoventilation: Secondary | ICD-10-CM | POA: Diagnosis not present

## 2016-03-22 ENCOUNTER — Encounter: Payer: Self-pay | Admitting: Cardiology

## 2016-03-22 ENCOUNTER — Ambulatory Visit (INDEPENDENT_AMBULATORY_CARE_PROVIDER_SITE_OTHER): Payer: Medicare Other | Admitting: Cardiology

## 2016-03-22 VITALS — BP 130/74 | HR 61 | Ht 62.0 in | Wt 326.0 lb

## 2016-03-22 DIAGNOSIS — I2510 Atherosclerotic heart disease of native coronary artery without angina pectoris: Secondary | ICD-10-CM

## 2016-03-22 DIAGNOSIS — E782 Mixed hyperlipidemia: Secondary | ICD-10-CM | POA: Diagnosis not present

## 2016-03-22 DIAGNOSIS — I10 Essential (primary) hypertension: Secondary | ICD-10-CM

## 2016-03-22 DIAGNOSIS — I890 Lymphedema, not elsewhere classified: Secondary | ICD-10-CM | POA: Diagnosis not present

## 2016-03-22 NOTE — Progress Notes (Signed)
Cardiology Office Note  Date: 03/22/2016   ID: [name], DOB 1961-03-17, MRN [id number]  PCP: Stacey Pierini, FNP  Primary Cardiologist: Stacey Dell, MD   Chief Complaint  Patient presents with  . Coronary Artery Disease    History of Present Illness: [name] is a 55 y.o. female last seen in October 2016. She presents for a routine follow-up visit. No reported angina symptoms or nitroglycerin use. She has had chronic back pain, also right leg pain and a feeling of coldness on that side, although to the touch both of her legs are warm and there are no asymmetric skin changes. She uses a cane to walk.  I reviewed her medications. Cardiac regimen includes aspirin, Lasix, Crestor, and as needed nitroglycerin.  She underwent a follow-up Cardiolite study last May, results outlined below. We have continued medical therapy and observation.  Weight is up compared to the last visit. She has chronic leg edema and lymphedema.  Past Medical History  Diagnosis Date  . GERD (gastroesophageal reflux disease)   . Hyperlipidemia   . Noncompliance   . CAD (coronary artery disease)     BMS to circumflex 2007 - Dr. Juanda Chance  . Morbid obesity (HCC)   . Type 2 diabetes mellitus (HCC)   . Essential hypertension   . HA (headache)   . Left-sided face pain   . OSA (obstructive sleep apnea)     Uses CPAP  . CKD (chronic kidney disease) stage 3, GFR 30-59 ml/min   . Urinary incontinence   . Arthritis   . Falls frequently     Past Surgical History  Procedure Laterality Date  . Cholecystectomy    . Abdominal hysterectomy    . Colonoscopy N/A 04/10/2013    Procedure: COLONOSCOPY;  Surgeon: Malissa Hippo, MD;  [facility];  Service: Endoscopy;  Laterality: N/A;  . Cataract surgery Bilateral   . Tubal ligation    . Abdominal hernia repair    . Breast surgery      biopsy per right breast; pt states has silver piece in for marking   . Lumbar  laminectomy/decompression microdiscectomy N/A 04/25/2015    Procedure: CENTRAL LUMBAR /DECOMPRESSION L4-L5;  Surgeon: Ranee Gosselin, MD;  Location: [facility];  Service: Orthopedics;  Laterality: N/A;  . Back surgery      Current Outpatient Prescriptions  Medication Sig Dispense Refill  . aspirin EC 325 MG tablet Take 325 mg by mouth every morning.     . furosemide (LASIX) 80 MG tablet Take 1 tablet (80 mg total) by mouth 2 (two) times daily. 180 tablet 1  . gabapentin (NEURONTIN) 300 MG capsule Take 1 capsule (300 mg total) by mouth 3 (three) times daily. 90 capsule 5  . glucose blood (ACCU-CHEK AVIVA PLUS) test strip Use aTest blood sugar once daily 100 each 2  . glucose blood (ONETOUCH VERIO) test strip Test BID and prn  e11.9 100 each 11  . metFORMIN (GLUCOPHAGE) 500 MG tablet Take 1 tablet (500 mg total) by mouth every morning. 90 tablet 1  . nitroGLYCERIN (NITROSTAT) 0.4 MG SL tablet See AUX Label 75 tablet 0  . omeprazole (PRILOSEC) 40 MG capsule Take 1 capsule (40 mg total) by mouth daily at 3 pm. 90 capsule 1  . ONETOUCH DELICA LANCETS 33G MISC Check blood sugar BID and prn  E11.9 100 each 11  . oxyCODONE-acetaminophen (PERCOCET) 10-325 MG tablet     . rosuvastatin (CRESTOR) 20 MG tablet Take 1.5  tablets (30 mg total) by mouth at bedtime. 90 tablet 1   No current facility-administered medications for this visit.   Allergies:  Morphine and related   Social History: The patient  reports that she has been smoking Cigarettes.  She has a 34 pack-year smoking history. She has never used smokeless tobacco. She reports that she does not drink alcohol or use illicit drugs.   ROS:  Please see the history of present illness. Otherwise, complete review of systems is positive for chronic back pain.  All other systems are reviewed and negative.   Physical Exam: VS:  BP 130/74 mmHg  Pulse 61  Ht 5\' 2"  (1.575 m)  Wt 326 lb (147.873 kg)  BMI 59.61 kg/m2  SpO2 97%, BMI Body mass index is 59.61  kg/(m^2).  Wt Readings from Last 3 Encounters:  03/22/16 326 lb (147.873 kg)  01/01/16 317 lb (143.79 kg)  10/06/15 316 lb (143.337 kg)    General: Morbidly obese woman, appears comfortable at rest. HEENT: Conjunctiva and lids normal, oropharynx clear with poor dentition. Neck: Supple, no elevated JVP or carotid bruits, no thyromegaly. Lungs: Decreased breath sounds without wheezing, nonlabored breathing at rest. Cardiac: Regular rate and rhythm, no S3 or significant systolic murmur, no pericardial rub. Abdomen: Soft, nontender, bowel sounds present, no guarding or rebound. Extremities: Lymphedema and chronic appearing venous stasis, distal pulses 1+. Skin: Warm and dry. Musculoskeletal: No kyphosis. Neuropsychiatric: Alert and oriented x3, affect grossly appropriate.  ECG: I personally reviewed the prior tracing from 08/23/2015 which showed sinus rhythm with right bundle branch block and vertical axis.  Recent Labwork: 08/22/2015: Hemoglobin 12.9 01/01/2016: ALT 13; AST 16; BUN 18; Creatinine, Ser 1.32*; Platelets 128*; Potassium 4.2; Sodium 143     Component Value Date/Time   CHOL 117 01/01/2016 0921   CHOL 127 12/30/2014 1537   TRIG 179* 01/01/2016 0921   TRIG 211* 12/30/2014 1537   HDL 42 01/01/2016 0921   HDL 48 12/30/2014 1537   HDL 44 04/09/2013 1135   CHOLHDL 2.8 01/01/2016 0921   CHOLHDL 4.5 04/09/2013 1135   VLDL 37 04/09/2013 1135   LDLCALC 39 01/01/2016 0921   LDLCALC 42 08/28/2014 1037   LDLCALC 115* 04/09/2013 1135   Other Studies Reviewed Today:  Lexiscan Cardiolite 5/44/2016:  There was no ST segment deviation noted during stress.  Defect 1: There is a medium defect of moderate severity present in the mid anterior and apical anterior location.  This is a low risk study.  Fixed anterior defect as outlined, most consistent with breast attenuation. LVEF 72%.  Echocardiogram 09/14/2013: Study Conclusions  - Left ventricle: The cavity size was normal.  There was moderate concentric hypertrophy. Systolic function was normal. The estimated ejection fraction was in the range of 55% to 60%. Wall motion was normal; there were no regional wall motion abnormalities. Doppler parameters are consistent with abnormal left ventricular relaxation (grade 1 diastolic dysfunction). - Right ventricle: The cavity size was moderately dilated. Systolic function was mildly to moderately reduced. - Atrial septum: A septal defect cannot be excluded. A patent foramen ovale cannot be excluded. Impressions:  - This was a limited study due to the patient position.  Assessment and Plan:  1. CAD status post BMS to the circumflex in 2007. She had a negative ischemic workup last year and remains symptomatically stable without active angina on medical therapy. Continue observation.  2. Essential hypertension, blood pressure is adequately controlled today.  3. CKD stage III, creatinine 1.3 in February.  4. Hyperlipidemia, continues on statin therapy. Most recent lipid numbers reviewed.  5. Chronic leg swelling and lymphedema. Continues on Lasix.  Current medicines were reviewed with the patient today.  Disposition: FU with me in 6 months.   Signed, Jonelle SidleSamuel G. Tsuyako Jolley, MD, [facility] 03/22/2016 10:18 AM    [facility] [address], [place], [place] [address] Phone: [phone]; Fax: [phone]

## 2016-03-22 NOTE — Patient Instructions (Signed)
Continue all current medications. Your physician wants you to follow up in: 6 months.  You will receive a reminder letter in the mail one-two months in advance.  If you don't receive a letter, please call our office to schedule the follow up appointment   

## 2016-04-08 DIAGNOSIS — E662 Morbid (severe) obesity with alveolar hypoventilation: Secondary | ICD-10-CM | POA: Diagnosis not present

## 2016-04-09 ENCOUNTER — Telehealth: Payer: Self-pay | Admitting: Nurse Practitioner

## 2016-04-09 NOTE — Telephone Encounter (Signed)
Patient last seen for diabetes on 01/01/16, please advise

## 2016-04-12 DIAGNOSIS — R0902 Hypoxemia: Secondary | ICD-10-CM | POA: Diagnosis not present

## 2016-04-12 DIAGNOSIS — R32 Unspecified urinary incontinence: Secondary | ICD-10-CM | POA: Diagnosis not present

## 2016-04-23 ENCOUNTER — Other Ambulatory Visit: Payer: Self-pay | Admitting: Nurse Practitioner

## 2016-04-26 ENCOUNTER — Other Ambulatory Visit: Payer: Self-pay | Admitting: Nurse Practitioner

## 2016-04-26 DIAGNOSIS — E785 Hyperlipidemia, unspecified: Secondary | ICD-10-CM

## 2016-04-27 MED ORDER — ROSUVASTATIN CALCIUM 20 MG PO TABS: 20.0000 mg | ORAL_TABLET | Freq: Every day | ORAL | Status: DC

## 2016-04-27 NOTE — Telephone Encounter (Signed)
done

## 2016-05-04 DIAGNOSIS — M4806 Spinal stenosis, lumbar region: Secondary | ICD-10-CM | POA: Diagnosis not present

## 2016-05-08 DIAGNOSIS — E662 Morbid (severe) obesity with alveolar hypoventilation: Secondary | ICD-10-CM | POA: Diagnosis not present

## 2016-06-08 DIAGNOSIS — E662 Morbid (severe) obesity with alveolar hypoventilation: Secondary | ICD-10-CM | POA: Diagnosis not present

## 2016-06-11 LAB — HM DIABETES EYE EXAM

## 2016-06-15 ENCOUNTER — Other Ambulatory Visit: Payer: Self-pay | Admitting: Nurse Practitioner

## 2016-06-15 DIAGNOSIS — E785 Hyperlipidemia, unspecified: Secondary | ICD-10-CM

## 2016-07-09 DIAGNOSIS — E662 Morbid (severe) obesity with alveolar hypoventilation: Secondary | ICD-10-CM | POA: Diagnosis not present

## 2016-07-13 ENCOUNTER — Telehealth: Payer: Self-pay | Admitting: Nurse Practitioner

## 2016-07-13 DIAGNOSIS — E1142 Type 2 diabetes mellitus with diabetic polyneuropathy: Secondary | ICD-10-CM

## 2016-07-13 MED ORDER — METFORMIN HCL 500 MG PO TABS: 500.0000 mg | ORAL_TABLET | Freq: Every morning | ORAL | 0 refills | Status: DC

## 2016-07-13 NOTE — Telephone Encounter (Signed)
Prescription sent in patient aware °

## 2016-08-30 ENCOUNTER — Other Ambulatory Visit: Payer: Self-pay | Admitting: Nurse Practitioner

## 2016-08-30 DIAGNOSIS — E1142 Type 2 diabetes mellitus with diabetic polyneuropathy: Secondary | ICD-10-CM

## 2016-08-30 DIAGNOSIS — E785 Hyperlipidemia, unspecified: Secondary | ICD-10-CM

## 2016-08-30 NOTE — Telephone Encounter (Signed)
Last refill without being seen 

## 2016-08-31 NOTE — Telephone Encounter (Signed)
Pt aware, appt set for Jan 2018

## 2016-10-05 ENCOUNTER — Ambulatory Visit: Payer: Medicare Other | Admitting: Nurse Practitioner

## 2016-10-12 IMAGING — NM NM MYOCAR MULTI W/SPECT W/WALL MOTION & EF
2 series · 12 of 12 positions shown · non-contrast
Comparison: none

[Series 1: stress gated · 8.28mm/px · 6 of 64 frames shown]
[frame 6/64]
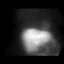
[frame 16/64]
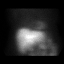
[frame 27/64]
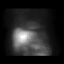
[frame 38/64]
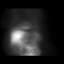
[frame 48/64]
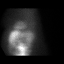
[frame 59/64]
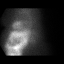

[Series 2: rest · 8.28mm/px · 6 of 64 frames shown]
[frame 6/64]
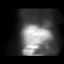
[frame 16/64]
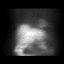
[frame 27/64]
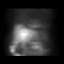
[frame 38/64]
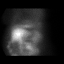
[frame 48/64]
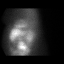
[frame 59/64]
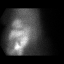

[12 of 12 positions shown; findings below may reference images not displayed]

Canned report from images found in remote index.

Refer to host system for actual result text.

## 2016-11-18 ENCOUNTER — Ambulatory Visit: Payer: Medicare Other | Admitting: Nurse Practitioner

## 2016-11-19 ENCOUNTER — Ambulatory Visit (INDEPENDENT_AMBULATORY_CARE_PROVIDER_SITE_OTHER): Payer: PPO | Admitting: Pediatrics

## 2016-11-19 ENCOUNTER — Telehealth: Payer: Self-pay | Admitting: Pediatrics

## 2016-11-19 ENCOUNTER — Ambulatory Visit (HOSPITAL_COMMUNITY)
Admission: RE | Admit: 2016-11-19 | Discharge: 2016-11-19 | Disposition: A | Discharge status: Home / Self Care | Payer: PPO | Source: Ambulatory Visit | Attending: Pediatrics | Admitting: Pediatrics

## 2016-11-19 ENCOUNTER — Encounter: Payer: Self-pay | Admitting: Pediatrics

## 2016-11-19 VITALS — BP 135/84 | HR 88 | Temp 97.0°F | Ht 62.0 in | Wt 322.4 lb

## 2016-11-19 DIAGNOSIS — I10 Essential (primary) hypertension: Secondary | ICD-10-CM | POA: Diagnosis not present

## 2016-11-19 DIAGNOSIS — N289 Disorder of kidney and ureter, unspecified: Secondary | ICD-10-CM | POA: Diagnosis not present

## 2016-11-19 DIAGNOSIS — I700 Atherosclerosis of aorta: Secondary | ICD-10-CM | POA: Diagnosis not present

## 2016-11-19 DIAGNOSIS — E785 Hyperlipidemia, unspecified: Secondary | ICD-10-CM

## 2016-11-19 DIAGNOSIS — R197 Diarrhea, unspecified: Secondary | ICD-10-CM | POA: Diagnosis not present

## 2016-11-19 DIAGNOSIS — K219 Gastro-esophageal reflux disease without esophagitis: Secondary | ICD-10-CM | POA: Diagnosis not present

## 2016-11-19 DIAGNOSIS — R933 Abnormal findings on diagnostic imaging of other parts of digestive tract: Secondary | ICD-10-CM | POA: Insufficient documentation

## 2016-11-19 DIAGNOSIS — K529 Noninfective gastroenteritis and colitis, unspecified: Secondary | ICD-10-CM

## 2016-11-19 DIAGNOSIS — R1033 Periumbilical pain: Secondary | ICD-10-CM

## 2016-11-19 DIAGNOSIS — E1142 Type 2 diabetes mellitus with diabetic polyneuropathy: Secondary | ICD-10-CM

## 2016-11-19 LAB — CMP14+EGFR
ALT: 24 IU/L (ref 0–32)
AST: 26 IU/L (ref 0–40)
Albumin/Globulin Ratio: 1.8 (ref 1.2–2.2)
Albumin: 4.4 g/dL (ref 3.5–5.5)
Alkaline Phosphatase: 100 IU/L (ref 39–117)
BUN/Creatinine Ratio: 7 — ABNORMAL LOW (ref 9–23)
BUN: 7 mg/dL (ref 6–24)
Bilirubin Total: 0.3 mg/dL (ref 0.0–1.2)
CO2: 23 mmol/L (ref 18–29)
Calcium: 9.3 mg/dL (ref 8.7–10.2)
Chloride: 101 mmol/L (ref 96–106)
Creatinine, Ser: 1.07 mg/dL — ABNORMAL HIGH (ref 0.57–1.00)
GFR calc Af Amer: 67 mL/min/{1.73_m2} (ref >59)
GFR calc non Af Amer: 58 mL/min/{1.73_m2} — ABNORMAL LOW (ref >59)
Globulin, Total: 2.5 g/dL (ref 1.5–4.5)
Glucose: 115 mg/dL — ABNORMAL HIGH (ref 65–99)
Potassium: 3.7 mmol/L (ref 3.5–5.2)
Sodium: 140 mmol/L (ref 134–144)
Total Protein: 6.9 g/dL (ref 6.0–8.5)

## 2016-11-19 LAB — CBC WITH DIFFERENTIAL/PLATELET
Basophils Absolute: 0 10*3/uL (ref 0.0–0.2)
Basos: 1 %
EOS (ABSOLUTE): 0.4 10*3/uL (ref 0.0–0.4)
Eos: 5 %
Hematocrit: 42.6 % (ref 34.0–46.6)
Hemoglobin: 14.2 g/dL (ref 11.1–15.9)
Immature Grans (Abs): 0 10*3/uL (ref 0.0–0.1)
Immature Granulocytes: 0 %
Lymphocytes Absolute: 2.4 10*3/uL (ref 0.7–3.1)
Lymphs: 31 %
MCH: 31 pg (ref 26.6–33.0)
MCHC: 33.3 g/dL (ref 31.5–35.7)
MCV: 93 fL (ref 79–97)
Monocytes Absolute: 0.5 10*3/uL (ref 0.1–0.9)
Monocytes: 7 %
Neutrophils Absolute: 4.3 10*3/uL (ref 1.4–7.0)
Neutrophils: 56 %
Platelets: 147 10*3/uL — ABNORMAL LOW (ref 150–379)
RBC: 4.58 x10E6/uL (ref 3.77–5.28)
RDW: 16 % — ABNORMAL HIGH (ref 12.3–15.4)
WBC: 7.6 10*3/uL (ref 3.4–10.8)

## 2016-11-19 LAB — LIPASE: Lipase: 23 U/L (ref 14–72)

## 2016-11-19 LAB — BAYER DCA HB A1C WAIVED: HB A1C (BAYER DCA - WAIVED): 7.7 % — ABNORMAL HIGH (ref <7.0)

## 2016-11-19 MED ORDER — METRONIDAZOLE 500 MG PO TABS: 500.0000 mg | ORAL_TABLET | Freq: Three times a day (TID) | ORAL | 0 refills | Status: DC

## 2016-11-19 MED ORDER — METFORMIN HCL 500 MG PO TABS: 500.0000 mg | ORAL_TABLET | Freq: Every morning | ORAL | 0 refills | Status: DC

## 2016-11-19 MED ORDER — IOPAMIDOL (ISOVUE-300) INJECTION 61%
100.0000 mL | Freq: Once | INTRAVENOUS | Status: AC | PRN
  Administered 2016-11-19: 100 mL via INTRAVENOUS

## 2016-11-19 MED ORDER — CIPROFLOXACIN HCL 500 MG PO TABS: 500.0000 mg | ORAL_TABLET | Freq: Two times a day (BID) | ORAL | 0 refills | Status: DC

## 2016-11-19 MED ORDER — IOPAMIDOL (ISOVUE-300) INJECTION 61%
INTRAVENOUS | Status: AC
  Administered 2016-11-19: 30 mL
  Filled 2016-11-19: qty 30

## 2016-11-19 MED ORDER — OMEPRAZOLE 40 MG PO CPDR: DELAYED_RELEASE_CAPSULE | ORAL | 0 refills | Status: DC

## 2016-11-19 MED ORDER — ROSUVASTATIN CALCIUM 20 MG PO TABS
20.0000 mg | ORAL_TABLET | Freq: Every day | ORAL | 0 refills | Status: DC
Start: 2016-11-19 — End: 2017-02-07

## 2016-11-19 NOTE — Addendum Note (Signed)
Addended by: Caryl BisBOWMAN, Zendayah Hardgrave M on: 11/19/2016 04:28 PM   Modules accepted: Orders

## 2016-11-19 NOTE — Progress Notes (Addendum)
Subjective:   Patient ID: [name], female    DOB: [birth year], 56 y.o.   MRN: [id number] CC: Medication Refill; No Appetite; Diarrhea; and Abdominal Pain  HPI: [name] is a 56 y.o. female presenting for Medication Refill; No Appetite; Diarrhea; and Abdominal Pain  Has had diarrhea for past 3-4 months Started having abd pain 5 days ago Hurts around navel, goes up to epigastric area Drinking doesn't cause abd pain Eating anything, including crackers, makes the pain worse No fevers Diarrhea this morning at 4am, 6am, 9am Going 4-5 times a day or more for past few months Has a good appetite but eating anything makes her hurt Has been feeling nauseous with the smell of food Had hernia repair a couple of years ago Has had gallbladder out  Relevant past medical, surgical, family and social history reviewed. Allergies and medications reviewed and updated. History  Smoking Status  . Current Every Day Smoker  . Packs/day: 1.00  . Years: 34.00  . Types: Cigarettes  Smokeless Tobacco  . Never Used   ROS: Per HPI   Objective:    BP (!) 141/84   Pulse 86   Temp 97 F (36.1 C) (Oral)   Ht '5\' 2"'  (1.575 m)   Wt (!) 322 lb 6.4 oz (146.2 kg)   BMI 58.97 kg/m   Wt Readings from Last 3 Encounters:  11/19/16 (!) 322 lb 6.4 oz (146.2 kg)  03/22/16 (!) 326 lb (147.9 kg)  01/01/16 (!) 317 lb (143.8 kg)    Gen: NAD, alert, cooperative with exam, NCAT EYES: EOMI, no conjunctival injection, or no icterus CV: NRRR, normal S1/S2, no murmur Resp: CTABL, no wheezes, normal WOB Abd: +BS, soft, obese with pannus and ventral hernia, TTP periumbilical and to L of umbilicus with gentle palpation. No redness over skin. TTP L lower quadrant with gentle palpation, less tender over LLQ and RLQ, TTP epigastric area. No guarding but difficult to assess with body habitus.  Ext: No edema, warm Neuro: Alert and oriented  Assessment & Plan:  [name] was seen today for medication refill, no appetite,  diarrhea and abdominal pain.  Diagnoses and all orders for this visit:  Periumbilical abdominal pain Appears comfortable, very TTP with exam periumbilicus, difficult to tell if in pannus/hernia area or intestines with body habitus, no redness on skin, no fevers. Present for past 5 days, will get imaging today, labs. Any worsening needs to be seen. -     CT Abdomen Pelvis W Contrast; Future -     Lipid panel -     CBC with Differential/Platelet -     CMP14+EGFR -     Lipase  Diarrhea Will need stool studies if CT unrevealing  Type 2 diabetes mellitus with diabetic polyneuropathy, without long-term current use of insulin (HCC) A1c 6.5 11 months ago, cont metformin, recheck a1c today -     Bayer DCA Hb A1c Waived -     metFORMIN (GLUCOPHAGE) 500 MG tablet; Take 1 tablet (500 mg total) by mouth every morning.  Hyperlipidemia with target LDL less than 100 Stable, cont below -     rosuvastatin (CRESTOR) 20 MG tablet; Take 1 tablet (20 mg total) by mouth at bedtime.  Essential hypertension, benign Slightly elevated today, in pain Recheck next visit  Gastroesophageal reflux disease without esophagitis Epigastric pain, new as above -     omeprazole (PRILOSEC) 40 MG capsule; TAKE 1 CAPSULE BY MOUTH  DAILY 30 min before a meal   Follow  up plan: Return in about 2 weeks (around 12/03/2016). Assunta Found, MD [facility]  ADDENDUM: CT scan with mild colitis Lipase nl, WBC nl Add on ESR/CRP Will check stool for infectious causes, if neg will refer to GI for IBD eval Given pain will treat with cipro/flagyl Tolerating fluids now, euvolemic on exam

## 2016-11-19 NOTE — Addendum Note (Signed)
Addended by: Johna SheriffVINCENT, CAROL L on: 11/19/2016 05:05 PM   Modules accepted: Orders

## 2016-11-20 LAB — LIPID PANEL
Chol/HDL Ratio: 6.8 ratio units — ABNORMAL HIGH (ref 0.0–4.4)
Cholesterol, Total: 218 mg/dL — ABNORMAL HIGH (ref 100–199)
HDL: 32 mg/dL — ABNORMAL LOW (ref >39)
LDL Calculated: 120 mg/dL — ABNORMAL HIGH (ref 0–99)
Triglycerides: 330 mg/dL — ABNORMAL HIGH (ref 0–149)
VLDL Cholesterol Cal: 66 mg/dL — ABNORMAL HIGH (ref 5–40)

## 2016-11-21 LAB — C-REACTIVE PROTEIN: CRP: 18.9 mg/L — ABNORMAL HIGH (ref 0.0–4.9)

## 2016-11-21 LAB — SPECIMEN STATUS REPORT

## 2016-11-22 ENCOUNTER — Telehealth: Payer: Self-pay | Admitting: Pediatrics

## 2016-11-22 NOTE — Telephone Encounter (Signed)
Pt states she is no longer having diarrhea  Pt states she is now constipated Do you want her to start the Flagyl Please advise

## 2016-11-22 NOTE — Telephone Encounter (Signed)
Patient has a follow up appointment scheduled. 

## 2016-11-22 NOTE — Telephone Encounter (Signed)
Pt says she hasnt had a stool for the past three days Woke up two days ago feeling great after having daily diarrhea for 3-4 months CT scan was done day before symptoms improved, no medication changes. Pt didn't start antibiotics. No abd pain for past three days. Will follow up with me in 2-3 weeks. OK to hold antibiotics for now given resolution of pain.

## 2016-12-15 ENCOUNTER — Ambulatory Visit: Payer: PPO | Admitting: Pediatrics

## 2016-12-16 ENCOUNTER — Encounter: Payer: Self-pay | Admitting: Pediatrics

## 2016-12-17 ENCOUNTER — Telehealth: Payer: Self-pay | Admitting: Pediatrics

## 2016-12-17 MED ORDER — GABAPENTIN 300 MG PO CAPS: 300.0000 mg | ORAL_CAPSULE | Freq: Three times a day (TID) | ORAL | 0 refills | Status: DC

## 2016-12-17 NOTE — Telephone Encounter (Signed)
done

## 2017-01-10 ENCOUNTER — Encounter: Payer: Self-pay | Admitting: Pediatrics

## 2017-01-10 ENCOUNTER — Ambulatory Visit (INDEPENDENT_AMBULATORY_CARE_PROVIDER_SITE_OTHER): Payer: PPO | Admitting: Pediatrics

## 2017-01-10 VITALS — BP 139/70 | HR 66 | Temp 97.0°F | Ht 62.0 in | Wt 329.2 lb

## 2017-01-10 DIAGNOSIS — K915 Postcholecystectomy syndrome: Secondary | ICD-10-CM

## 2017-01-10 DIAGNOSIS — N3945 Continuous leakage: Secondary | ICD-10-CM | POA: Diagnosis not present

## 2017-01-10 DIAGNOSIS — E1142 Type 2 diabetes mellitus with diabetic polyneuropathy: Secondary | ICD-10-CM

## 2017-01-10 MED ORDER — COLESTIPOL HCL 1 G PO TABS: 1.0000 g | ORAL_TABLET | Freq: Two times a day (BID) | ORAL | 2 refills | Status: DC

## 2017-01-10 MED ORDER — METFORMIN HCL 500 MG PO TABS: 500.0000 mg | ORAL_TABLET | Freq: Two times a day (BID) | ORAL | 0 refills | Status: DC

## 2017-01-10 NOTE — Progress Notes (Signed)
  Subjective:   Patient ID: [name], female    DOB: 05/21/1961, 56 y.o.   MRN: [id number] CC: Follow-up multiple med problems HPI: [name] is a 56 y.o. female presenting for Follow-up  Last seven days having 3-4 bowel movements a day Better than what it was Still with pain around her naval S/p cholecystectomy About 20 minutes after she eats has to had bowel movement Still loose No blood in diarrhea Diarrhea for the last 6 mo  Most anmoyed now by bladder Has incontinence in the morning getting out of bed Happens a lot when she stands Has constant dripping of urine When doing kegel exercises still has contant dripping No burning with urination Both stress and urge incontinence Has been going on for at elast 3-4 yrs  Baking vegetables, meat Rare fried   Relevant past medical, surgical, family and social history reviewed. Allergies and medications reviewed and updated. History  Smoking Status  . Current Every Day Smoker  . Packs/day: 1.00  . Years: 34.00  . Types: Cigarettes  Smokeless Tobacco  . Never Used   ROS: Per HPI   Objective:    BP 139/70   Pulse 66   Temp 97 F (36.1 C) (Oral)   Ht 5\' 2"  (1.575 m)   Wt (!) 329 lb 3.2 oz (149.3 kg)   BMI 60.21 kg/m   Wt Readings from Last 3 Encounters:  01/10/17 (!) 329 lb 3.2 oz (149.3 kg)  11/19/16 (!) 322 lb 6.4 oz (146.2 kg)  03/22/16 (!) 326 lb (147.9 kg)    Gen: NAD, alert, cooperative with exam, NCAT EYES: EOMI, no conjunctival injection, or no icterus CV: NRRR, normal S1/S2, no murmur Resp: CTABL, no wheezes, normal WOB Abd: +BS, soft, NTND. Ext: No edema, warm Neuro: Alert and oriented MSK: normal muscle bulk  Assessment & Plan:  Stacey RhodesBetty was seen today for follow-up med problems  Diagnoses and all orders for this visit:  Continuous leakage of urine Worse with stress/coughing sneezing Ongoing for several years -     Ambulatory referral to Urogynecology -     Urinalysis, Complete -      Urine culture  Type 2 diabetes mellitus with diabetic polyneuropathy, without long-term current use of insulin (HCC) Last A1c 7.7, would like to improve Cont metformin, take twice a day -     Microalbumin / creatinine urine ratio -     metFORMIN (GLUCOPHAGE) 500 MG tablet; Take 1 tablet (500 mg total) by mouth 2 (two) times daily with a meal.  Post-cholecystectomy syndrome Trial of below for diarrhea -     colestipol (COLESTID) 1 g tablet; Take 1 tablet (1 g total) by mouth 2 (two) times daily.  Follow up plan: Return in about 2 months (around 03/12/2017). sooner if needed [name] Miqueas Whilden, MD [facility]

## 2017-01-11 LAB — MICROALBUMIN / CREATININE URINE RATIO
Creatinine, Urine: 25.3 mg/dL
Microalb/Creat Ratio: <11.9 mg/g creat (ref 0.0–30.0)
Microalbumin, Urine: <3 ug/mL

## 2017-01-14 ENCOUNTER — Other Ambulatory Visit: Payer: Self-pay | Admitting: Pediatrics

## 2017-01-14 DIAGNOSIS — N3090 Cystitis, unspecified without hematuria: Secondary | ICD-10-CM

## 2017-01-14 LAB — URINE CULTURE

## 2017-01-14 LAB — MICROSCOPIC EXAMINATION: Renal Epithel, UA: NONE SEEN /hpf

## 2017-01-14 LAB — URINALYSIS, COMPLETE
Bilirubin, UA: NEGATIVE
Glucose, UA: NEGATIVE
Ketones, UA: NEGATIVE
Nitrite, UA: NEGATIVE
Protein, UA: NEGATIVE
Specific Gravity, UA: 1.01 (ref 1.005–1.030)
Urobilinogen, Ur: 0.2 mg/dL (ref 0.2–1.0)
pH, UA: 6.5 (ref 5.0–7.5)

## 2017-01-14 LAB — SPECIMEN STATUS REPORT

## 2017-01-14 MED ORDER — AMOXICILLIN 500 MG PO CAPS: 500.0000 mg | ORAL_CAPSULE | Freq: Two times a day (BID) | ORAL | 0 refills | Status: DC

## 2017-02-07 ENCOUNTER — Other Ambulatory Visit: Payer: Self-pay | Admitting: Pediatrics

## 2017-02-07 DIAGNOSIS — E785 Hyperlipidemia, unspecified: Secondary | ICD-10-CM

## 2017-02-07 DIAGNOSIS — E1142 Type 2 diabetes mellitus with diabetic polyneuropathy: Secondary | ICD-10-CM

## 2017-02-09 ENCOUNTER — Other Ambulatory Visit: Payer: Self-pay | Admitting: Pediatrics

## 2017-02-09 DIAGNOSIS — E785 Hyperlipidemia, unspecified: Secondary | ICD-10-CM

## 2017-03-22 ENCOUNTER — Other Ambulatory Visit: Payer: Self-pay | Admitting: Pediatrics

## 2017-03-22 ENCOUNTER — Other Ambulatory Visit: Payer: Self-pay | Admitting: Nurse Practitioner

## 2017-03-22 DIAGNOSIS — Z1231 Encounter for screening mammogram for malignant neoplasm of breast: Secondary | ICD-10-CM

## 2017-03-25 ENCOUNTER — Ambulatory Visit: Payer: PPO | Admitting: Urology

## 2017-03-28 ENCOUNTER — Other Ambulatory Visit: Payer: Self-pay | Admitting: Pediatrics

## 2017-03-28 DIAGNOSIS — K219 Gastro-esophageal reflux disease without esophagitis: Secondary | ICD-10-CM

## 2017-03-31 ENCOUNTER — Telehealth: Payer: Self-pay | Admitting: Pediatrics

## 2017-03-31 NOTE — Telephone Encounter (Signed)
Spoke with pt and [facility]. [facility] and called pt back to advise Thayer OhmChris is getting her Rx's ready.

## 2017-03-31 NOTE — Progress Notes (Signed)
Cardiology Office Note  Date: 04/01/2017   ID: [name], DOB 10/07/1961, MRN [id number]  PCP: Johna SheriffVincent, Carol L, MD  Primary Cardiologist: Nona DellSamuel Gail Vendetti, MD   Chief Complaint  Patient presents with  . Coronary Artery Disease    History of Present Illness: [name] is a 56 y.o. female last seen in May 2017. She presents for a routine follow-up visit. Since last visit she states that she has used nitroglycerin on 2 occasions. Otherwise, has had no progressive angina symptoms or increasing shortness of breath. She continues to use a cane to ambulate.  Current cardiac regimen includes aspirin, Lasix, Crestor, and as needed nitroglycerin. I personally reviewed her ECG today which shows sinus rhythm with low voltage, R' in lead V1 and V2, leftward axis.  Myoview from November 2016 was overall low risk showing breast attenuation and LVEF 72%. We plan to continue with observation for now in the absence of accelerating angina symptoms.  Past Medical History:  Diagnosis Date  . Arthritis   . CAD (coronary artery disease)    BMS to circumflex 2007 - Dr. Juanda ChanceBrodie  . CKD (chronic kidney disease) stage 3, GFR 30-59 ml/min   . Essential hypertension   . Falls frequently   . GERD (gastroesophageal reflux disease)   . HA (headache)   . Hyperlipidemia   . Left-sided face pain   . Morbid obesity (HCC)   . Noncompliance   . OSA (obstructive sleep apnea)    Uses CPAP  . Type 2 diabetes mellitus (HCC)   . Urinary incontinence     Past Surgical History:  Procedure Laterality Date  . ABDOMINAL HERNIA REPAIR    . ABDOMINAL HYSTERECTOMY    . BACK SURGERY    . BREAST SURGERY     biopsy per right breast; pt states has silver piece in for marking   . Cataract surgery Bilateral   . CHOLECYSTECTOMY    . COLONOSCOPY N/A 04/10/2013   Procedure: COLONOSCOPY;  Surgeon: Malissa HippoNajeeb U Rehman, MD;  [facility];  Service: Endoscopy;  Laterality: N/A;  . LUMBAR LAMINECTOMY/DECOMPRESSION  MICRODISCECTOMY N/A 04/25/2015   Procedure: CENTRAL LUMBAR /DECOMPRESSION L4-L5;  Surgeon: Ranee Gosselinonald Gioffre, MD;  Location: [facility];  Service: Orthopedics;  Laterality: N/A;  . TUBAL LIGATION      Current Outpatient Prescriptions  Medication Sig Dispense Refill  . aspirin EC 325 MG tablet Take 325 mg by mouth every morning.     . colestipol (COLESTID) 1 g tablet Take 1 tablet (1 g total) by mouth 2 (two) times daily. 60 tablet 2  . gabapentin (NEURONTIN) 300 MG capsule TAKE 1 CAPSULE BY MOUTH THREE TIMES DAILY 270 capsule 0  . glucose blood (ACCU-CHEK AVIVA PLUS) test strip Use aTest blood sugar once daily 100 each 2  . glucose blood (ONETOUCH VERIO) test strip Test BID and prn  e11.9 100 each 11  . LASIX 80 MG tablet Take 1 tablet by mouth two  times daily 180 tablet 0  . metFORMIN (GLUCOPHAGE) 500 MG tablet Take 1 tablet (500 mg total) by mouth 2 (two) times daily with a meal. 180 tablet 0  . nitroGLYCERIN (NITROSTAT) 0.4 MG SL tablet See AUX Label 75 tablet 0  . omeprazole (PRILOSEC) 40 MG capsule TAKE ONE CAPSULE BY MOUTH DAILY 30 MINUTES BEFORE A MEAL 90 capsule 0  . ONETOUCH DELICA LANCETS 33G MISC Check blood sugar BID and prn  E11.9 100 each 11  . rosuvastatin (CRESTOR) 20 MG tablet  TAKE ONE TABLET BY MOUTH AT BEDTIME 90 tablet 0   No current facility-administered medications for this visit.    Allergies:  Morphine and related   Social History: The patient  reports that she has been smoking Cigarettes.  She has a 34.00 pack-year smoking history. She has never used smokeless tobacco. She reports that she does not drink alcohol or use drugs.   ROS:  Please see the history of present illness. Otherwise, complete review of systems is positive for chronic back pain, leg weakness.  All other systems are reviewed and negative.   Physical Exam: VS:  BP (!) 142/72   Pulse 78   Ht 5\' 2"  (1.575 m)   Wt (!) 329 lb (149.2 kg)   BMI 60.17 kg/m , BMI Body mass index is 60.17 kg/m.  Wt  Readings from Last 3 Encounters:  04/01/17 (!) 329 lb (149.2 kg)  01/10/17 (!) 329 lb 3.2 oz (149.3 kg)  11/19/16 (!) 322 lb 6.4 oz (146.2 kg)    General: Morbidly obese woman, appears comfortable at rest. HEENT: Conjunctiva and lids normal, oropharynx clear with poor dentition. Neck: Supple, no elevated JVP or carotid bruits, no thyromegaly. Lungs: Decreased breath sounds without wheezing, nonlabored breathing at rest. Cardiac: Regular rate and rhythm, no S3 or significant systolic murmur, no pericardial rub. Abdomen: Soft, nontender, bowel sounds present, no guarding or rebound. Extremities: Lymphedema and chronic appearing venous stasis, distal pulses 1+. Skin: Warm and dry. Musculoskeletal: No kyphosis. Neuropsychiatric: Alert and oriented x3, affect grossly appropriate.  ECG: I personally reviewed the tracing from 08/23/2015 which showed sinus rhythm with right bundle branch block and vertical axis.  Recent Labwork: 11/19/2016: ALT 24; AST 26; BUN 7; Creatinine, Ser 1.07; Platelets 147; Potassium 3.7; Sodium 140     Component Value Date/Time   CHOL 218 (H) 11/19/2016 1051   TRIG 330 (H) 11/19/2016 1051   TRIG 211 (H) 12/30/2014 1537   HDL 32 (L) 11/19/2016 1051   HDL 48 12/30/2014 1537   CHOLHDL 6.8 (H) 11/19/2016 1051   CHOLHDL 4.5 04/09/2013 1135   VLDL 37 04/09/2013 1135   LDLCALC 120 (H) 11/19/2016 1051   LDLCALC 42 08/28/2014 1037    Other Studies Reviewed Today:  Lexiscan Cardiolite 5/44/2016:  There was no ST segment deviation noted during stress.  Defect 1: There is a medium defect of moderate severity present in the mid anterior and apical anterior location.  This is a low risk study.  Fixed anterior defect as outlined, most consistent with breast attenuation. LVEF 72%.  Echocardiogram 09/14/2013: Study Conclusions  - Left ventricle: The cavity size was normal. There was moderate concentric hypertrophy. Systolic function was normal. The estimated  ejection fraction was in the range of 55% to 60%. Wall motion was normal; there were no regional wall motion abnormalities. Doppler parameters are consistent with abnormal left ventricular relaxation (grade 1 diastolic dysfunction). - Right ventricle: The cavity size was moderately dilated. Systolic function was mildly to moderately reduced. - Atrial septum: A septal defect cannot be excluded. A patent foramen ovale cannot be excluded. Impressions:  - This was a limited study due to the patient position.  Assessment and Plan:  1. Symptomatically stable CAD status post BMS to the circumflex in 2007. Follow-up Myoview study within the last 2 years was low risk and we plan to continue with medical therapy and observation.  2. Hyperlipidemia, continues on Crestor. Following lipids with Dr. Oswaldo Done.  3. Lymphedema, overall stable. She remains on Lasix.  4. History of renal insufficiency, last creatinine down to 1.1.  Current medicines were reviewed with the patient today.   Orders Placed This Encounter  Procedures  . EKG 12-Lead    Disposition: Follow-up in 6 months.   Signed, Jonelle Sidle, MD, [name] 04/01/2017 11:19 AM    [facility] at [facility] [address] [place], [place], [place] [address] Phone: [phone]; Fax: [phone]

## 2017-04-01 ENCOUNTER — Encounter: Payer: Self-pay | Admitting: Cardiology

## 2017-04-01 ENCOUNTER — Ambulatory Visit (INDEPENDENT_AMBULATORY_CARE_PROVIDER_SITE_OTHER): Payer: PPO | Admitting: Cardiology

## 2017-04-01 VITALS — BP 142/72 | HR 78 | Ht 62.0 in | Wt 329.0 lb

## 2017-04-01 DIAGNOSIS — I890 Lymphedema, not elsewhere classified: Secondary | ICD-10-CM | POA: Diagnosis not present

## 2017-04-01 DIAGNOSIS — N289 Disorder of kidney and ureter, unspecified: Secondary | ICD-10-CM

## 2017-04-01 DIAGNOSIS — I2510 Atherosclerotic heart disease of native coronary artery without angina pectoris: Secondary | ICD-10-CM | POA: Diagnosis not present

## 2017-04-01 DIAGNOSIS — E782 Mixed hyperlipidemia: Secondary | ICD-10-CM

## 2017-04-01 NOTE — Patient Instructions (Signed)

## 2017-04-07 ENCOUNTER — Ambulatory Visit (HOSPITAL_COMMUNITY)
Admission: RE | Admit: 2017-04-07 | Discharge: 2017-04-07 | Disposition: A | Discharge status: Home / Self Care | Payer: PPO | Source: Ambulatory Visit | Attending: Pediatrics | Admitting: Pediatrics

## 2017-04-07 DIAGNOSIS — Z1231 Encounter for screening mammogram for malignant neoplasm of breast: Secondary | ICD-10-CM | POA: Insufficient documentation

## 2017-04-18 ENCOUNTER — Encounter: Payer: Self-pay | Admitting: Pediatrics

## 2017-04-18 ENCOUNTER — Ambulatory Visit (INDEPENDENT_AMBULATORY_CARE_PROVIDER_SITE_OTHER): Payer: PPO | Admitting: Pediatrics

## 2017-04-18 VITALS — BP 154/92 | HR 66 | Temp 96.9°F | Ht 62.0 in | Wt 337.0 lb

## 2017-04-18 DIAGNOSIS — I10 Essential (primary) hypertension: Secondary | ICD-10-CM

## 2017-04-18 DIAGNOSIS — E1142 Type 2 diabetes mellitus with diabetic polyneuropathy: Secondary | ICD-10-CM

## 2017-04-18 DIAGNOSIS — I25119 Atherosclerotic heart disease of native coronary artery with unspecified angina pectoris: Secondary | ICD-10-CM

## 2017-04-18 DIAGNOSIS — E785 Hyperlipidemia, unspecified: Secondary | ICD-10-CM

## 2017-04-18 LAB — BAYER DCA HB A1C WAIVED: HB A1C (BAYER DCA - WAIVED): 7.8 % — ABNORMAL HIGH (ref <7.0)

## 2017-04-18 MED ORDER — METFORMIN HCL 500 MG PO TABS: 1000.0000 mg | ORAL_TABLET | Freq: Two times a day (BID) | ORAL | 1 refills | Status: DC

## 2017-04-18 MED ORDER — DAPAGLIFLOZIN PROPANEDIOL 5 MG PO TABS: 5.0000 mg | ORAL_TABLET | Freq: Every day | ORAL | 2 refills | Status: DC

## 2017-04-18 MED ORDER — LISINOPRIL 10 MG PO TABS: 10.0000 mg | ORAL_TABLET | Freq: Every day | ORAL | 3 refills | Status: DC

## 2017-04-18 NOTE — Progress Notes (Signed)
  Subjective:   Patient ID: [name], female    DOB: August 11, 1961, 56 y.o.   MRN: [id number] CC: Diabetes (3 mos ckup)  HPI: [name] is a 56 y.o. female presenting for Diabetes (3 mos ckup)  DM2:  Skips breakfast usually Eating two meals a day On metformin Only drinks black coffee and caffeine free regular sprites, about 1-1.5 a day  Tobacco: smokes regularly, every morning with coffee Not interested in cessation at this time  HTN: headaches several days a week now Has BP cuff at home, not using regularly now  Continues to have intermittent diarrhea colestid helped some Says she is learning to deal with it Had colonoscopy 4 years ago, due for repeat screening in 2019  Relevant past medical, surgical, family and social history reviewed. Allergies and medications reviewed and updated. History  Smoking Status  . Current Every Day Smoker  . Packs/day: 1.00  . Years: 34.00  . Types: Cigarettes  Smokeless Tobacco  . Never Used   ROS: Per HPI   Objective:    BP (!) 154/92   Pulse 66   Temp (!) 96.9 F (36.1 C) (Oral)   Ht _0  (1.575 m)   Wt (!) 337 lb (152.9 kg)   BMI 61.64 kg/m   Wt Readings from Last 3 Encounters:  04/18/17 (!) 337 lb (152.9 kg)  04/01/17 (!) 329 lb (149.2 kg)  01/10/17 (!) 329 lb 3.2 oz (149.3 kg)    Gen: NAD, alert, cooperative with exam, NCAT EYES: EOMI, no conjunctival injection, or no icterus CV: NRRR, normal S1/S2, no murmur Resp: CTABL, no wheezes, normal WOB Abd: +BS, soft, mildly tender with palpation, obese.  Ext: b/l with non-pitting lymphedema, some woody changes, warm Neuro: Alert and oriented, decreased sensation to soft touch and pin prick b/l legs and feet  Assessment & Plan:  Zamariyah was seen today for diabetes.  Diagnoses and all orders for this visit:  Type 2 diabetes mellitus with diabetic polyneuropathy, without long-term current use of insulin (HCC) A1c 7.8. Increase metformin. Start farxiga 60m. -     Bayer  DCA Hb A1c Waived -     metFORMIN (GLUCOPHAGE) 500 MG tablet; Take 2 tablets (1,000 mg total) by mouth 2 (two) times daily with a meal.  Essential hypertension, benign Check BPs at home, let me know if stay elevated -     lisinopril (PRINIVIL,ZESTRIL) 10 MG tablet; Take 1 tablet (10 mg total) by mouth daily. -     BMP8+EGFR  Hyperlipidemia with target LDL less than 100 Cont crestor  Coronary artery disease involving native coronary artery of native heart with angina pectoris ([place] On aspirin, denies any recent symptoms  Follow up plan: Return in about 3 months (around 07/19/2017). CAssunta Found MD [place]

## 2017-04-19 LAB — BMP8+EGFR
BUN/Creatinine Ratio: 8 — ABNORMAL LOW (ref 9–23)
BUN: 9 mg/dL (ref 6–24)
CO2: 24 mmol/L (ref 20–29)
Calcium: 8.8 mg/dL (ref 8.7–10.2)
Chloride: 104 mmol/L (ref 96–106)
Creatinine, Ser: 1.11 mg/dL — ABNORMAL HIGH (ref 0.57–1.00)
GFR calc Af Amer: 64 mL/min/{1.73_m2} (ref >59)
GFR calc non Af Amer: 55 mL/min/{1.73_m2} — ABNORMAL LOW (ref >59)
Glucose: 145 mg/dL — ABNORMAL HIGH (ref 65–99)
Potassium: 4 mmol/L (ref 3.5–5.2)
Sodium: 145 mmol/L — ABNORMAL HIGH (ref 134–144)

## 2017-04-25 ENCOUNTER — Telehealth: Payer: Self-pay | Admitting: Pediatrics

## 2017-04-25 MED ORDER — SITAGLIPTIN PHOSPHATE 100 MG PO TABS: 100.0000 mg | ORAL_TABLET | Freq: Every day | ORAL | 2 refills | Status: DC

## 2017-04-26 NOTE — Telephone Encounter (Signed)
Pt notified of med change.

## 2017-05-23 ENCOUNTER — Other Ambulatory Visit: Payer: Self-pay | Admitting: Pediatrics

## 2017-05-23 ENCOUNTER — Encounter: Payer: Self-pay | Admitting: *Deleted

## 2017-05-23 DIAGNOSIS — E785 Hyperlipidemia, unspecified: Secondary | ICD-10-CM

## 2017-06-01 ENCOUNTER — Ambulatory Visit: Payer: PPO | Admitting: Urology

## 2017-06-06 ENCOUNTER — Telehealth: Payer: Self-pay | Admitting: Pediatrics

## 2017-06-06 NOTE — Telephone Encounter (Signed)
Pt informed ok to pick up FOBT

## 2017-06-24 ENCOUNTER — Other Ambulatory Visit: Payer: Self-pay | Admitting: Pediatrics

## 2017-06-24 DIAGNOSIS — K219 Gastro-esophageal reflux disease without esophagitis: Secondary | ICD-10-CM

## 2017-07-27 ENCOUNTER — Ambulatory Visit: Payer: PPO | Admitting: Pediatrics

## 2017-07-28 ENCOUNTER — Ambulatory Visit: Payer: PPO | Admitting: Pediatrics

## 2017-08-24 ENCOUNTER — Other Ambulatory Visit: Payer: Self-pay | Admitting: Pediatrics

## 2017-08-24 DIAGNOSIS — E785 Hyperlipidemia, unspecified: Secondary | ICD-10-CM

## 2017-09-24 ENCOUNTER — Other Ambulatory Visit: Payer: Self-pay | Admitting: Pediatrics

## 2017-09-24 DIAGNOSIS — K219 Gastro-esophageal reflux disease without esophagitis: Secondary | ICD-10-CM

## 2017-10-19 ENCOUNTER — Other Ambulatory Visit: Payer: Self-pay | Admitting: Pediatrics

## 2017-11-04 ENCOUNTER — Ambulatory Visit: Payer: PPO

## 2017-11-18 ENCOUNTER — Encounter: Payer: Self-pay | Admitting: Pediatrics

## 2017-11-18 ENCOUNTER — Ambulatory Visit (INDEPENDENT_AMBULATORY_CARE_PROVIDER_SITE_OTHER): Payer: PPO | Admitting: Pediatrics

## 2017-11-18 VITALS — BP 123/74 | HR 86 | Temp 97.0°F | Ht 62.0 in | Wt 317.4 lb

## 2017-11-18 DIAGNOSIS — D696 Thrombocytopenia, unspecified: Secondary | ICD-10-CM | POA: Diagnosis not present

## 2017-11-18 DIAGNOSIS — E0843 Diabetes mellitus due to underlying condition with diabetic autonomic (poly)neuropathy: Secondary | ICD-10-CM | POA: Diagnosis not present

## 2017-11-18 DIAGNOSIS — K915 Postcholecystectomy syndrome: Secondary | ICD-10-CM | POA: Diagnosis not present

## 2017-11-18 DIAGNOSIS — E1142 Type 2 diabetes mellitus with diabetic polyneuropathy: Secondary | ICD-10-CM | POA: Diagnosis not present

## 2017-11-18 DIAGNOSIS — E785 Hyperlipidemia, unspecified: Secondary | ICD-10-CM | POA: Diagnosis not present

## 2017-11-18 DIAGNOSIS — I10 Essential (primary) hypertension: Secondary | ICD-10-CM | POA: Diagnosis not present

## 2017-11-18 DIAGNOSIS — K219 Gastro-esophageal reflux disease without esophagitis: Secondary | ICD-10-CM

## 2017-11-18 LAB — BAYER DCA HB A1C WAIVED: HB A1C (BAYER DCA - WAIVED): 9.7 % — ABNORMAL HIGH (ref <7.0)

## 2017-11-18 MED ORDER — FUROSEMIDE 40 MG PO TABS: 40.0000 mg | ORAL_TABLET | Freq: Two times a day (BID) | ORAL | 1 refills | Status: DC

## 2017-11-18 MED ORDER — SITAGLIPTIN PHOSPHATE 100 MG PO TABS: 100.0000 mg | ORAL_TABLET | Freq: Every day | ORAL | 0 refills | Status: DC

## 2017-11-18 MED ORDER — GABAPENTIN 300 MG PO CAPS: 300.0000 mg | ORAL_CAPSULE | Freq: Three times a day (TID) | ORAL | 0 refills | Status: DC

## 2017-11-18 MED ORDER — GLUCOSE BLOOD VI STRP: ORAL_STRIP | 11 refills | Status: DC

## 2017-11-18 MED ORDER — ROSUVASTATIN CALCIUM 20 MG PO TABS: 20.0000 mg | ORAL_TABLET | Freq: Every day | ORAL | 0 refills | Status: DC

## 2017-11-18 MED ORDER — METFORMIN HCL 500 MG PO TABS: 1000.0000 mg | ORAL_TABLET | Freq: Every day | ORAL | 0 refills | Status: DC

## 2017-11-18 MED ORDER — INSULIN GLARGINE 300 UNIT/ML ~~LOC~~ SOPN: 20.0000 [IU] | PEN_INJECTOR | Freq: Every day | SUBCUTANEOUS | 1 refills | Status: DC

## 2017-11-18 MED ORDER — COLESTIPOL HCL 1 G PO TABS: 1.0000 g | ORAL_TABLET | Freq: Two times a day (BID) | ORAL | 0 refills | Status: DC

## 2017-11-18 MED ORDER — OMEPRAZOLE 40 MG PO CPDR: DELAYED_RELEASE_CAPSULE | ORAL | 0 refills | Status: DC

## 2017-11-18 MED ORDER — FUROSEMIDE 40 MG PO TABS: 40.0000 mg | ORAL_TABLET | Freq: Every day | ORAL | 1 refills | Status: DC

## 2017-11-18 MED ORDER — LISINOPRIL 10 MG PO TABS: 10.0000 mg | ORAL_TABLET | Freq: Every day | ORAL | 0 refills | Status: DC

## 2017-11-18 NOTE — Progress Notes (Signed)
Subjective:   Patient ID: [name], female    DOB: Jul 22, 1961, 57 y.o.   MRN: [id number] CC: Follow-up and Hyperglycemia  HPI: [name] is a 57 y.o. female presenting for Follow-up and Hyperglycemia here today with her husband Last seen in clinic 04/2017  DM2: BGLs have been elevated into 300s Taking metformin once a day in the morning Lost insurance last year so wasn't able to follow up  Neuropathy: taking gabapentin regularly, thinks it helps some Has some ongoing pain in her lower back and legs b/l, sometimes feels like cramps, sometimes "just hurts"  HTN: taking lisinopril regularly, no CP, no SOB  HLD: on statin, no side effects  GERD: has reflux symptoms when she misses dose of PPI  Some swelling in b/l lower legs, she takes lasix some days, thinks it helps  Relevant past medical, surgical, family and social history reviewed. Allergies and medications reviewed and updated. Social History   Tobacco Use  Smoking Status Current Every Day Smoker  . Packs/day: 1.00  . Years: 34.00  . Pack years: 34.00  . Types: Cigarettes  Smokeless Tobacco Never Used   ROS: Per HPI   Objective:    BP 123/74   Pulse 86   Temp (!) 97 F (36.1 C) (Oral)   Ht '5\' 2"'  (1.575 m)   Wt (!) 317 lb 6.4 oz (144 kg)   BMI 58.05 kg/m   Wt Readings from Last 3 Encounters:  11/18/17 (!) 317 lb 6.4 oz (144 kg)  04/18/17 (!) 337 lb (152.9 kg)  04/01/17 (!) 329 lb (149.2 kg)    Gen: NAD, alert, cooperative with exam, NCAT EYES: EOMI, no conjunctival injection, or no icterus ENT:  OP without erythema LYMPH: no cervical LAD CV: NRRR, normal S1/S2 Resp: CTABL, no wheezes, normal WOB Abd: +BS, soft, NTND. Ext: Non-pitting edema b/l lower legs, warm Neuro: Alert and oriented  Assessment & Plan:  [name] was seen today for follow-up and hyperglycemia.  Diagnoses and all orders for this visit:  Essential hypertension, benign Stable, cont medicine Recheck labs today -     Bayer DCA  Hb A1c Waived -     BMP8+EGFR -     Lipid panel -     lisinopril (PRINIVIL,ZESTRIL) 10 MG tablet; Take 1 tablet (10 mg total) by mouth daily. -     furosemide (LASIX) 40 MG tablet; Take 1 tablet (40 mg total) by mouth 1 time daily.  Diabetic autonomic neuropathy associated with diabetes mellitus due to underlying condition (HCC) A1c elevated at 9.7 on metformin Uncontrolled Start long acting once a day insulin at 20 u, check BGl every morning Write down, f/u in 4 weeks -     Bayer DCA Hb A1c Waived -     BMP8+EGFR -     Lipid panel  Type 2 diabetes mellitus with diabetic polyneuropathy, without long-term current use of insulin (HCC) -     Bayer DCA Hb A1c Waived -     BMP8+EGFR -     Lipid panel -     glucose blood (ONETOUCH VERIO) test strip; Test BID and prn  e11.9 -     metFORMIN (GLUCOPHAGE) 500 MG tablet; Take 2 tablets (1,000 mg total) by mouth daily with breakfast.  Hyperlipidemia with target LDL less than 100 cont statin -     rosuvastatin (CRESTOR) 20 MG tablet; Take 1 tablet (20 mg total) by mouth at bedtime.   Post-cholecystectomy syndrome Continues to have some diarrhea after  fatty meals, sometimes after whatever she eats Use below with meals -     colestipol (COLESTID) 1 g tablet; Take 1 tablet (1 g total) by mouth 2 (two) times daily.  Gastroesophageal reflux disease without esophagitis Stable, cont below -     omeprazole (PRILOSEC) 40 MG capsule; TAKE 1 CAPSULE BY MOUTH ONCE DAILY 30 MINUTES BEFORE A MEAL   Follow up plan: Return in about 4 weeks (around 12/16/2017). Assunta Found, MD [place]

## 2017-11-19 LAB — LIPID PANEL
Chol/HDL Ratio: 3.2 ratio (ref 0.0–4.4)
Cholesterol, Total: 120 mg/dL (ref 100–199)
HDL: 38 mg/dL — ABNORMAL LOW (ref >39)
LDL Calculated: 38 mg/dL (ref 0–99)
Triglycerides: 219 mg/dL — ABNORMAL HIGH (ref 0–149)
VLDL Cholesterol Cal: 44 mg/dL — ABNORMAL HIGH (ref 5–40)

## 2017-11-19 LAB — BMP8+EGFR
BUN/Creatinine Ratio: 8 — ABNORMAL LOW (ref 9–23)
BUN: 9 mg/dL (ref 6–24)
CO2: 22 mmol/L (ref 20–29)
Calcium: 9.3 mg/dL (ref 8.7–10.2)
Chloride: 100 mmol/L (ref 96–106)
Creatinine, Ser: 1.18 mg/dL — ABNORMAL HIGH (ref 0.57–1.00)
GFR calc Af Amer: 59 mL/min/{1.73_m2} — ABNORMAL LOW (ref >59)
GFR calc non Af Amer: 51 mL/min/{1.73_m2} — ABNORMAL LOW (ref >59)
Glucose: 226 mg/dL — ABNORMAL HIGH (ref 65–99)
Potassium: 4.3 mmol/L (ref 3.5–5.2)
Sodium: 138 mmol/L (ref 134–144)

## 2017-11-21 ENCOUNTER — Telehealth: Payer: Self-pay | Admitting: Pediatrics

## 2017-11-21 NOTE — Telephone Encounter (Signed)
Returned patient's phone call.  [place] [facility] [place] [facility]  Patient states that she has already applied and made to much money.  Patient can not afford prescriptions at this time

## 2017-11-23 ENCOUNTER — Ambulatory Visit (INDEPENDENT_AMBULATORY_CARE_PROVIDER_SITE_OTHER): Payer: PPO | Admitting: *Deleted

## 2017-11-23 VITALS — BP 135/70 | HR 74 | Ht 62.0 in | Wt 313.0 lb

## 2017-11-23 DIAGNOSIS — Z23 Encounter for immunization: Secondary | ICD-10-CM

## 2017-11-23 DIAGNOSIS — Z0000 Encounter for general adult medical examination without abnormal findings: Secondary | ICD-10-CM

## 2017-11-23 MED ORDER — INSULIN GLARGINE 100 UNIT/ML SOLOSTAR PEN: 20.0000 [IU] | PEN_INJECTOR | Freq: Every day | SUBCUTANEOUS | 99 refills | Status: DC

## 2017-11-23 NOTE — Progress Notes (Signed)
Subjective:   [name] is a 57 y.o. female who presents for an Initial Medicare Annual Wellness Visit.  [name] is accompanied today by her husband [name].  They have been married for 42 years.  She has 5 children, 1 is deceased, and 8 grandchildren.  One of her grandsons lives with [name] and her husband.  She states they have 6 small dogs that live in their home.  [name] worked at [employer], until she went out of work on disability in 2012 due to arthritis and neuropathy pain.  She enjoys sewing and watching television.  She describes her health as being worse this year than it was last year because she does not have much energy and states she just does not feel good often.  She has had no emergency room visits, hospitalizations, or surgeries in the past year.   Review of Systems    All negative today.        Objective:    Today's Vitals   11/23/17 0814  BP: 135/70  Pulse: 74  Weight: (!) 313 lb (142 kg)  Height: 5\' 2"  (1.575 m)  PainSc: 0-No pain   Body mass index is 57.25 kg/m.  Advanced Directives 08/22/2015 04/25/2015 04/22/2015 08/19/2014 04/09/2013  Does Patient Have a Medical Advance Directive? No No No No Patient does not have advance directive;Patient would not like information  Would patient like information on creating a medical advance directive? No - patient declined information No - patient declined information Yes - Educational materials given - -  Pre-existing out of facility DNR order (yellow form or pink MOST form) - - - - No   Patient does not have advanced directives.  Information given today.  Current Medications (verified) Outpatient Encounter Medications as of 11/23/2017  Medication Sig  . aspirin EC 325 MG tablet Take 325 mg by mouth every morning.   . furosemide (LASIX) 40 MG tablet Take 1 tablet (40 mg total) by mouth daily.  Marland Kitchen gabapentin (NEURONTIN) 300 MG capsule Take 1 capsule (300 mg total) by mouth 3 (three) times daily.  Marland Kitchen  glucose blood (ACCU-CHEK AVIVA PLUS) test strip Use aTest blood sugar once daily  . glucose blood (ONETOUCH VERIO) test strip Test BID and prn  e11.9  . lisinopril (PRINIVIL,ZESTRIL) 10 MG tablet Take 1 tablet (10 mg total) by mouth daily.  . metFORMIN (GLUCOPHAGE) 500 MG tablet Take 2 tablets (1,000 mg total) by mouth daily with breakfast.  . nitroGLYCERIN (NITROSTAT) 0.4 MG SL tablet See AUX Label  . omeprazole (PRILOSEC) 40 MG capsule TAKE 1 CAPSULE BY MOUTH ONCE DAILY 30 MINUTES BEFORE A MEAL  . ONETOUCH DELICA LANCETS 33G MISC Check blood sugar BID and prn  E11.9  . rosuvastatin (CRESTOR) 20 MG tablet Take 1 tablet (20 mg total) by mouth at bedtime.  . colestipol (COLESTID) 1 g tablet Take 1 tablet (1 g total) by mouth 2 (two) times daily. (Patient not taking: Reported on 11/23/2017)  . Insulin Glargine (TOUJEO SOLOSTAR) 300 UNIT/ML SOPN Inject 20 Units into the skin at bedtime. (Patient not taking: Reported on 11/23/2017)  . sitaGLIPtin (JANUVIA) 100 MG tablet Take 1 tablet (100 mg total) by mouth daily. (Patient not taking: Reported on 11/23/2017)   No facility-administered encounter medications on file as of 11/23/2017.     Allergies (verified) Morphine and related   History: Past Medical History:  Diagnosis Date  . Arthritis   . CAD (coronary artery disease)  BMS to circumflex 2007 - Dr. Juanda ChanceBrodie  . CKD (chronic kidney disease) stage 3, GFR 30-59 ml/min (HCC)   . Essential hypertension   . Falls frequently   . GERD (gastroesophageal reflux disease)   . HA (headache)   . Hyperlipidemia   . Left-sided face pain   . Morbid obesity (HCC)   . Noncompliance   . OSA (obstructive sleep apnea)    Uses CPAP  . Type 2 diabetes mellitus (HCC)   . Urinary incontinence    Past Surgical History:  Procedure Laterality Date  . ABDOMINAL HERNIA REPAIR    . ABDOMINAL HYSTERECTOMY    . BACK SURGERY    . BREAST SURGERY     biopsy per right breast; pt states has silver piece in for marking    . Cataract surgery Bilateral   . CHOLECYSTECTOMY    . COLONOSCOPY N/A 04/10/2013   Procedure: COLONOSCOPY;  Surgeon: Malissa HippoNajeeb U Rehman, MD;  Location: [facility];  Service: Endoscopy;  Laterality: N/A;  . LUMBAR LAMINECTOMY/DECOMPRESSION MICRODISCECTOMY N/A 04/25/2015   Procedure: CENTRAL LUMBAR /DECOMPRESSION L4-L5;  Surgeon: Ranee Gosselinonald Gioffre, MD;  Location: [facility];  Service: Orthopedics;  Laterality: N/A;  . TUBAL LIGATION     Family History  Problem Relation Age of Onset  . Coronary artery disease Father   . Cancer - Colon Father   . Diabetes Father   . High Cholesterol Father   . Breast cancer Mother    Social History   Socioeconomic History  . Marital status: Married    Spouse name: Stacey NoaWilliam  . Number of children: 5  . Years of education: 10 th  . Highest education level: None  Social Needs  . Financial resource strain: None  . Food insecurity - worry: None  . Food insecurity - inability: None  . Transportation needs - medical: None  . Transportation needs - non-medical: None  Occupational History    Employer: UNEMPLOYED    Comment: disabled  Tobacco Use  . Smoking status: Current Every Day Smoker    Packs/day: 1.50    Years: 34.00    Pack years: 51.00    Types: Cigarettes  . Smokeless tobacco: Never Used  Substance and Sexual Activity  . Alcohol use: No    Alcohol/week: 0.0 oz  . Drug use: No  . Sexual activity: No    Birth control/protection: Surgical  Other Topics Concern  . None  Social History Narrative   Patient lives at home with her husband and grandchild [name](William). Patient is disabled.   Patient has 10 th grade education.   Right handed.   Caffeine- one  cup of coffee and  One soda Dr.Pepper/ tea. daily    Tobacco Counseling Ready to quit: Yes Counseling given: Yes  Patient states she would like to quit smoking and she and her husband plan to work on this together.  Clinical Intake:     Pain Score: 0-No pain                   Activities of Daily Living In your present state of health, do you have any difficulty performing the following activities: 11/23/2017  Hearing? N  Vision? N  Difficulty concentrating or making decisions? N  Walking or climbing stairs? Y  Comment uses cane for help with balance due to leg and back pain  Dressing or bathing? N  Doing errands, shopping? Y  Comment Husband or grandson goes with patient to appointments or grocery store  Preparing Food  and eating ? N  Using the Toilet? N  In the past six months, have you accidently leaked urine? Y  Comment wears adult pull ups  Do you have problems with loss of bowel control? Y  Comment Feels urgency with bowel movements after eating, has had accidents  Managing your Medications? N  Managing your Finances? N  Housekeeping or managing your Housekeeping? N  Some recent data might be hidden     Immunizations and Health Maintenance Immunization History  Administered Date(s) Administered  . Influenza,inj,Quad PF,6+ Mos 08/10/2013, 08/28/2014, 09/25/2015, 11/23/2017   Health Maintenance Due  Topic Date Due  . HIV Screening  05/10/1974  . TETANUS/TDAP  05/10/1978  . PAP SMEAR  05/10/1980  . OPHTHALMOLOGY EXAM  06/11/2017    Patient Care Team: Johna Sheriff, MD as PCP - General (Internal Medicine) Levert Feinstein, MD as Consulting Physician (Neurology) Ranee Gosselin, MD as Consulting Physician (Orthopedic Surgery)  Indicate any recent Medical Services you may have received from other than [facility] providers in the past year (date may be approximate).     Assessment:   This is a routine wellness examination for Deazia.  Hearing/Vision screen No hearing deficit noted. Patient states she wears over the counter reading glasses, and feels she sees well with these.  She goes to [facility], and is due for a visit now.  She plans to call to schedule in the next week.   Dietary issues and exercise activities  discussed: Current Exercise Habits: The patient does not participate in regular exercise at present  Goals    . Exercise     Increase exercise- start by walking 2 minutes around house 3 times per day, gradually increasing to a total of walking 20-30 minutes per day    . Quit smoking / using tobacco      Depression Screen PHQ 2/9 Scores 11/23/2017 11/18/2017 04/18/2017 01/10/2017 11/19/2016 01/01/2016 05/08/2015  PHQ - 2 Score 1 0 2 0 0 5 2  PHQ- 9 Score - - 8 - - 21 5  Exception Documentation - - Other- indicate reason in comment box - - - -  Not completed - - alot going on in family; doesn't have energy - - - -    Fall Risk Fall Risk  11/23/2017 11/23/2017 11/18/2017 01/10/2017 11/19/2016  Falls in the past year? (No Data) Yes No Yes Yes  Comment Slipped and fell in bath tub, now has hand rails and a shower chair to prevent falls - - - -  Number falls in past yr: - 1 - 2 or more 2 or more  Injury with Fall? - No - No No  Risk Factor Category  - - - High Fall Risk High Fall Risk  Risk for fall due to : - Impaired balance/gait - History of fall(s);Impaired balance/gait History of fall(s);Impaired balance/gait  Follow up - Education provided;Falls prevention discussed - Falls evaluation completed Falls prevention discussed    Is the patient's home free of loose throw rugs in walkways, pet beds, electrical cords, etc?   Yes      Grab bars in the bathroom? Yes       Handrails on the stairs?   Yes      Adequate lighting?   Yes    Cognitive Function: MMSE - Mini Mental State Exam 11/23/2017  Orientation to time 5  Orientation to Place 5  Registration 3  Attention/ Calculation 5  Recall 2  Language- name 2 objects 2  Language- repeat 1  Language- follow 3 step command 3  Language- read & follow direction 1  Write a sentence 1  Copy design 1  Total score 29        Screening Tests Health Maintenance  Topic Date Due  . HIV Screening  05/10/1974  . TETANUS/TDAP  05/10/1978  . PAP  SMEAR  05/10/1980  . OPHTHALMOLOGY EXAM  06/11/2017  . INFLUENZA VACCINE  07/21/2018 (Originally 06/08/2017)  . PNEUMOCOCCAL POLYSACCHARIDE VACCINE (2) 04/10/2018  . COLONOSCOPY  04/10/2018  . FOOT EXAM  04/18/2018  . HEMOGLOBIN A1C  05/18/2018  . MAMMOGRAM  04/08/2019  . Hepatitis C Screening  Completed   Tdap declined today Recommend to discuss with Dr. Oswaldo Done need for pap smear as patient has had a hysterectomy. Flu vaccine given today. Patient to schedule eye exam   Cancer Screenings: Lung: Low Dose CT Chest recommended if Age 65-80 years, 30 pack-year currently smoking OR have quit w/in 15years. Patient does qualify. Breast: Up to date on Mammogram? Yes   Colorectal: up to date   Additional Screenings:  Hepatitis B/HIV/Syphillis: Recommend in the future  Hepatitis C Screening: Done 09/11/2015     Plan:     Work on increasing activity- start with walking 2 minutes 3 times per day - gradually increasing to 20-30 minutes per day  Review information given on Advanced Directives, if you complete these please bring our office a copy to file in your chart. Try to eat a diet of mostly vegetables, lean proteins, whole grains, and fruits in moderation.  Continue to work on smoking cessation   I have personally reviewed and noted the following in the patient's chart:   . Medical and social history . Use of alcohol, tobacco or illicit drugs  . Current medications and supplements . Functional ability and status . Nutritional status . Physical activity . Advanced directives . List of other physicians . Hospitalizations, surgeries, and ER visits in previous 12 months . Vitals . Screenings to include cognitive, depression, and falls . Referrals and appointments  In addition, I have reviewed and discussed with patient certain preventive protocols, quality metrics, and best practice recommendations. A written personalized care plan for preventive services as well as general  preventive health recommendations were provided to patient.     WYATT, AMY M, RN   11/23/2017     I have reviewed and agree with the above AWV documentation.   Rex Kras, MD [facility] 11/23/2017, 2:14 PM

## 2017-11-23 NOTE — Telephone Encounter (Signed)
Does she have a preferred medication list or formulary from her insurance company? It would help to know what medicines will be more affordable.

## 2017-11-23 NOTE — Telephone Encounter (Signed)
I found the formulary online. I sent in a different insulin, called lantus. Take 20 u at night. I sent it in to [facility]. Is it any cheaper?

## 2017-11-23 NOTE — Addendum Note (Signed)
Addended by: Johna SheriffVINCENT, Bertina Guthridge L on: 11/23/2017 04:58 PM   Modules accepted: Orders

## 2017-11-23 NOTE — Patient Instructions (Signed)
Please work on increasing your activity- start with walking 2 minutes 3 times per day - gradually increasing to 20-30 minutes per day   Please review information given on Advanced Directives, if you complete these please bring our office a copy to file in your chart.  Try to eat a diet of mostly vegetables, lean proteins, whole grains, and fruits in moderation.   You received your flu shot today.   Consider working on quitting smoking.   Thank you for coming in for your Annual Wellness Visit today!   Preventive Care 40-64 Years, Female Preventive care refers to lifestyle choices and visits with your health care provider that can promote health and wellness. What does preventive care include?  A yearly physical exam. This is also called an annual well check.  Dental exams once or twice a year.  Routine eye exams. Ask your health care provider how often you should have your eyes checked.  Personal lifestyle choices, including: ? Daily care of your teeth and gums. ? Regular physical activity. ? Eating a healthy diet. ? Avoiding tobacco and drug use. ? Limiting alcohol use. ? Practicing safe sex. ? Taking low-dose aspirin daily starting at age 71. ? Taking vitamin and mineral supplements as recommended by your health care provider. What happens during an annual well check? The services and screenings done by your health care provider during your annual well check will depend on your age, overall health, lifestyle risk factors, and family history of disease. Counseling Your health care provider may ask you questions about your:  Alcohol use.  Tobacco use.  Drug use.  Emotional well-being.  Home and relationship well-being.  Sexual activity.  Eating habits.  Work and work Statistician.  Method of birth control.  Menstrual cycle.  Pregnancy history.  Screening You may have the following tests or measurements:  Height, weight, and BMI.  Blood pressure.  Lipid  and cholesterol levels. These may be checked every 5 years, or more frequently if you are over 48 years old.  Skin check.  Lung cancer screening. You may have this screening every year starting at age 30 if you have a 30-pack-year history of smoking and currently smoke or have quit within the past 15 years.  Fecal occult blood test (FOBT) of the stool. You may have this test every year starting at age 51.  Flexible sigmoidoscopy or colonoscopy. You may have a sigmoidoscopy every 5 years or a colonoscopy every 10 years starting at age 21.  Hepatitis C blood test.  Hepatitis B blood test.  Sexually transmitted disease (STD) testing.  Diabetes screening. This is done by checking your blood sugar (glucose) after you have not eaten for a while (fasting). You may have this done every 1-3 years.  Mammogram. This may be done every 1-2 years. Talk to your health care provider about when you should start having regular mammograms. This may depend on whether you have a family history of breast cancer.  BRCA-related cancer screening. This may be done if you have a family history of breast, ovarian, tubal, or peritoneal cancers.  Pelvic exam and Pap test. This may be done every 3 years starting at age 80. Starting at age 68, this may be done every 5 years if you have a Pap test in combination with an HPV test.  Bone density scan. This is done to screen for osteoporosis. You may have this scan if you are at high risk for osteoporosis.  Discuss your test results, treatment options,  and if necessary, the need for more tests with your health care provider. Vaccines Your health care provider may recommend certain vaccines, such as:  Influenza vaccine. This is recommended every year.  Tetanus, diphtheria, and acellular pertussis (Tdap, Td) vaccine. You may need a Td booster every 10 years.  Varicella vaccine. You may need this if you have not been vaccinated.  Zoster vaccine. You may need this after  age 95.  Measles, mumps, and rubella (MMR) vaccine. You may need at least one dose of MMR if you were born in 1957 or later. You may also need a second dose.  Pneumococcal 13-valent conjugate (PCV13) vaccine. You may need this if you have certain conditions and were not previously vaccinated.  Pneumococcal polysaccharide (PPSV23) vaccine. You may need one or two doses if you smoke cigarettes or if you have certain conditions.  Meningococcal vaccine. You may need this if you have certain conditions.  Hepatitis A vaccine. You may need this if you have certain conditions or if you travel or work in places where you may be exposed to hepatitis A.  Hepatitis B vaccine. You may need this if you have certain conditions or if you travel or work in places where you may be exposed to hepatitis B.  Haemophilus influenzae type b (Hib) vaccine. You may need this if you have certain conditions.  Talk to your health care provider about which screenings and vaccines you need and how often you need them. This information is not intended to replace advice given to you by your health care provider. Make sure you discuss any questions you have with your health care provider. Document Released: 11/21/2015 Document Revised: 07/14/2016 Document Reviewed: 08/26/2015 Elsevier Interactive Patient Education  [employer].

## 2017-11-24 ENCOUNTER — Other Ambulatory Visit: Payer: Self-pay | Admitting: *Deleted

## 2017-11-24 ENCOUNTER — Other Ambulatory Visit: Payer: Self-pay | Admitting: Pediatrics

## 2017-11-24 DIAGNOSIS — Z794 Long term (current) use of insulin: Principal | ICD-10-CM

## 2017-11-24 DIAGNOSIS — E118 Type 2 diabetes mellitus with unspecified complications: Secondary | ICD-10-CM

## 2017-11-24 MED ORDER — GLIMEPIRIDE 2 MG PO TABS: 2.0000 mg | ORAL_TABLET | Freq: Every day | ORAL | 3 refills | Status: DC

## 2017-11-24 MED ORDER — INSULIN GLARGINE 100 UNIT/ML SOLOSTAR PEN: 20.0000 [IU] | PEN_INJECTOR | Freq: Every day | SUBCUTANEOUS | 99 refills | Status: DC

## 2017-11-24 NOTE — Telephone Encounter (Signed)
I sent in glimeperide 2mg . Take once a day in the morning with her first meal. She should check her blood sugar level every morning, write down, bring to her next clinic visit in 4 weeks. Any numbers less than 100 let me know. Was she able to pick up the insulin? The down side of this medication is it can cause low blood sugars and weight gain while it controls blood sugars. Very important that she keeps working on decreasing sugar intake, no sodas, sweet tea, sugary drinks.

## 2017-11-24 NOTE — Addendum Note (Signed)
Addended by: Johna SheriffVINCENT, CAROL L on: 11/24/2017 11:53 AM   Modules accepted: Orders

## 2017-11-24 NOTE — Progress Notes (Signed)
[facility]

## 2017-11-24 NOTE — Telephone Encounter (Signed)
Pt aware Lantus sent in but she needs something else for Januvia as well since it costs $90 and she can't afford it.

## 2017-11-24 NOTE — Telephone Encounter (Signed)
Pt aware glimiperide sent into pharmacy as well

## 2017-11-25 ENCOUNTER — Other Ambulatory Visit: Payer: Self-pay

## 2017-11-25 NOTE — Patient Outreach (Signed)
[facility]) Care Management  11/25/2017  Dorothy PufferBetty J Ebanks 09/30/1961 [id number]   Telephone call to patient for MD referral screen.  No answer.  Unable to leave a message.   Plan: RN CM will attempt patient again within 5 business days.  Bary Lericheionne J Carole Doner, RN, MSN [facility] Care [facility] Care Management Coordinator Direct Line [phone] Toll Free: [phone]  Fax: [phone]

## 2017-11-29 ENCOUNTER — Other Ambulatory Visit: Payer: Self-pay

## 2017-11-29 NOTE — Patient Outreach (Signed)
[facility]) Care Management  11/29/2017  Stacey PufferBetty J [name] 10/21/1961 [id number]   Referral Date: 11/24/17 Referral Source: Physicians office Referral Reason: Patient unable to afford diabetes medications.     Outreach Attempt:  Spoke with patient and she is able to verify HIPAA.  Primary care physician made referral due to patient having problems affording insulin and is having unable to control sugars adequately with current regimen. Patient last A1c 9.7.  Patient states she is just not able to afford her medications to control her diabetes and tries to diet control her diabetes by not eating but is finding that her sugars are still high.   Social: Patient lives in the home with her husband and grandson. She is able to bathe and dress herself but struggles.  Patient unable to do things around the house such as cook and clean due to arthritis and overall medical condition.  [place] [place] but her income is too much.     Conditions: Patient has DM-2 ans having problems with affording insulin. Blood sugars running in the 200-300 range regularly. Patient also admits to arthritis and COPD.     Medications: Patient having problems affording medications specifically insulin.  She currently states she takes metformin for her diabetes.     Appointments:  Patient saw physician last on 11-18-17.  Patient has transportation to appointments.     Advanced Directives: Patient does not have an advanced directive or living will.    Consent: Patient consents to services of health coach and pharmacy for assistance.   Plan:RN CM will refer to pharmacy for assistance with getting insulin as recommended by physician as oral diabetic medications are not enough per physician. RN CM will refer to health coach for disease management of diabetes. RN CM will sign off at this time.     Stacey Lericheionne J Lillyana Majette, RN, MSN [facility] Care Management Care Management  Coordinator Direct Line [phone] Toll Free: [phone]  Fax: [phone]

## 2017-12-01 ENCOUNTER — Encounter: Payer: Self-pay | Admitting: Pharmacist

## 2017-12-02 ENCOUNTER — Other Ambulatory Visit: Payer: Self-pay

## 2017-12-02 NOTE — Patient Outreach (Signed)
[name]) Care Management  12/02/2017  [name] 02/20/1961 [id number]   Telephone call to patient for introduction.  Patient able to verify HIPAA. Discussed and offered [facility]. Patient verbally agreed to services.    Patient states she is doing fair but her blood sugar this morning was 234.  She has not been able to pay for some of her medications.  A referral has been placed by  Fleeta Emmerionne Leath RN.  Explained to the patient health coach role.  Patient is open to the call.    Plan:  RN Health Coach will make an outreach attempt to the patient with in the month of January.  Juanell Fairlyraci Labarron Durnin RN, BSN, [facility] RN Health Coach Disease Management [facility] Direct Dial:  [phone] Fax: [phone]

## 2017-12-07 ENCOUNTER — Other Ambulatory Visit: Payer: Self-pay | Admitting: Pharmacist

## 2017-12-07 NOTE — Patient Outreach (Signed)
[facility]) Care Management  [facility]   12/07/2017  [name] 1961-01-31 [id number]  Subjective: Called patient regarding medication assistance per referral from [facility].  HIPAA identifiers were obtained. Patient is a 57 year old female with multiple medical conditions including but not limited to: uncontrolled type two diabetes, GAD, hypertension, GERD, morbid obesity, peripheral edema, spinal stenosis, history of stroke, and post cholecystectomy syndrome.  Patient reported managing her medications on her own. She uses [facility] in [place] and expressed difficulty paying for Januvia, Glimepiride and Lantus.  Objective:   Encounter Medications: Outpatient Encounter Medications as of 12/07/2017  Medication Sig  . aspirin EC 325 MG tablet Take 325 mg by mouth every morning.   . furosemide (LASIX) 40 MG tablet Take 1 tablet (40 mg total) by mouth daily.  Marland Kitchen gabapentin (NEURONTIN) 300 MG capsule Take 1 capsule (300 mg total) by mouth 3 (three) times daily.  Marland Kitchen glucose blood (ACCU-CHEK AVIVA PLUS) test strip Use aTest blood sugar once daily  . lisinopril (PRINIVIL,ZESTRIL) 10 MG tablet Take 1 tablet (10 mg total) by mouth daily.  . metFORMIN (GLUCOPHAGE) 500 MG tablet Take 2 tablets (1,000 mg total) by mouth daily with breakfast.  . nitroGLYCERIN (NITROSTAT) 0.4 MG SL tablet See AUX Label  . omeprazole (PRILOSEC) 40 MG capsule TAKE 1 CAPSULE BY MOUTH ONCE DAILY 30 MINUTES BEFORE A MEAL  . rosuvastatin (CRESTOR) 20 MG tablet Take 1 tablet (20 mg total) by mouth at bedtime.  . colestipol (COLESTID) 1 g tablet Take 1 tablet (1 g total) by mouth 2 (two) times daily. (Patient not taking: Reported on 11/23/2017)  . glimepiride (AMARYL) 2 MG tablet Take 1 tablet (2 mg total) by mouth daily before breakfast. (Patient not taking: Reported on 12/07/2017)  . glucose blood (ONETOUCH VERIO) test strip Test BID and prn  e11.9 (Patient not taking: Reported on 12/07/2017)  .  Insulin Glargine (LANTUS SOLOSTAR) 100 UNIT/ML Solostar Pen Inject 20 Units into the skin daily at 10 pm. (Patient not taking: Reported on 12/07/2017)  . ONETOUCH DELICA LANCETS 33G MISC Check blood sugar BID and prn  E11.9 (Patient not taking: Reported on 12/07/2017)   No facility-administered encounter medications on file as of 12/07/2017.     Functional Status: In your present state of health, do you have any difficulty performing the following activities: 11/23/2017  Hearing? N  Vision? N  Difficulty concentrating or making decisions? N  Walking or climbing stairs? Y  Comment uses cane for help with balance due to leg and back pain  Dressing or bathing? N  Doing errands, shopping? Y  Comment Husband or grandson goes with patient to appointments or grocery store  Preparing Food and eating ? N  Using the Toilet? N  In the past six months, have you accidently leaked urine? Y  Comment wears adult pull ups  Do you have problems with loss of bowel control? Y  Comment Feels urgency with bowel movements after eating, has had accidents  Managing your Medications? N  Managing your Finances? N  Housekeeping or managing your Housekeeping? N  Some recent data might be hidden    Fall/Depression Screening: Fall Risk  11/23/2017 11/23/2017 11/18/2017  Falls in the past year? (No Data) Yes No  Comment Slipped and fell in bath tub, now has hand rails and a shower chair to prevent falls - -  Number falls in past yr: - 1 -  Injury with Fall? - No -  Risk Factor  Category  - - -  Risk for fall due to : - Impaired balance/gait -  Follow up - Education provided;Falls prevention discussed -   PHQ 2/9 Scores 11/29/2017 11/23/2017 11/18/2017 04/18/2017 01/10/2017 11/19/2016 01/01/2016  PHQ - 2 Score 3 1 0 2 0 0 5  PHQ- 9 Score 13 - - 8 - - 21  Exception Documentation - - - Other- indicate reason in comment box - - -  Not completed - - - alot going on in family; doesn't have energy - - -       Assessment:  Drugs sorted by system:  Neurologic/Psychologic: Gabapentin  Cardiovascular: Aspirin Lisinopril Nitroglycerin Rosuvastatin  Pulmonary/Allergy:  Gastrointestinal: Omeprazole Colestipol  Endocrine: Metformin Glimepiride (not taking) Lantus (not taking)(  Medication Review Findings:  Adherence-patient is not taking the following medications due to perceived cost: Lantus- $64.79 Colestipol-$11.92 Glimepiride-0.60  Januvia was prescribed and the patient reported the copay was $90. However, Januvia is a tier 3 medication on the patient's insurance formulary so it should be $45 for a 30 day supply and $90 for a 90 day supply.  Patient said she could not pay $45 monthly for one medication.  Medication Assistance Findings: Patient has [employer] Patient has partial extra help  Januvia is available through the patient assistance program offered by [employer].  A note will be sent the patient's provider to see if she wants to restart the Januvia.  Since the Lantus is $65, the patient said she could not afford Lantus. [employer] will provide Lantus to patients through their patient assistance program but patients have to spend $1000 out-of-pocket to qualify. Since it is the beginning of the year, the patient has not spent anywhere close to $1000.   Metformin-to help with glucose control, the metformin dose could be optimized over time to 2,000mg  daily.  Plan: Route note to PCP to find out if she wants to restart Januvia and attempt to get it from [employer] and to clarify the patient's diabetes regimen (is glimepiride still necessary?)   Follow up with the patient after I hear back from Dr. Oswaldo DoneVincent.  Beecher McardleKatina J. Liany Mumpower, PharmD, BCACP [facility] Clinical Pharmacist [phone]

## 2017-12-08 ENCOUNTER — Other Ambulatory Visit: Payer: Self-pay

## 2017-12-08 NOTE — Patient Outreach (Signed)
[facility]) Care Management  [facility]  12/08/2017   Stacey PufferBetty J [name] 01/28/1961 [id number]  Subjective: Telephone call to the patient for an initial assessment. HIPAA verified.  The patient states that she lives with her husband and grandson.  She can perform her ADLS independently and is dependant for her IADLS. The patient states that she has problems with cleaning due to lower back and leg pain. Her husband or daughter will take her to her appointments, The patient states that she is adherent with her medications that she has but is having trouble purchasing some of her diabetes medications. She has been contacted by our pharmacist Nunzio CobbsKatina Boyd. The patient also states that she has problems with purchasing food.  She states that after paying bills there isn't a lot of money for food.   Talking to the patient about her diabetes she states that her blood sugar this morning was 211. She states that it runs between 200 to 313.  She states that she only eats once a day because she is not hungry.  RN Health Coach talked with the patient about diet.  The patient does not have an advance directive but stated that she was given the paperwork. The patient has a office visit scheduled with her physician on 12/19/2017.   Objective:   Encounter Medications:  Outpatient Encounter Medications as of 12/08/2017  Medication Sig  . aspirin EC 325 MG tablet Take 325 mg by mouth every morning.   . furosemide (LASIX) 40 MG tablet Take 1 tablet (40 mg total) by mouth daily.  Marland Kitchen. gabapentin (NEURONTIN) 300 MG capsule Take 1 capsule (300 mg total) by mouth 3 (three) times daily.  Marland Kitchen. lisinopril (PRINIVIL,ZESTRIL) 10 MG tablet Take 1 tablet (10 mg total) by mouth daily.  . metFORMIN (GLUCOPHAGE) 500 MG tablet Take 2 tablets (1,000 mg total) by mouth daily with breakfast.  . nitroGLYCERIN (NITROSTAT) 0.4 MG SL tablet See AUX Label  . omeprazole (PRILOSEC) 40 MG capsule TAKE 1 CAPSULE BY MOUTH ONCE  DAILY 30 MINUTES BEFORE A MEAL  . rosuvastatin (CRESTOR) 20 MG tablet Take 1 tablet (20 mg total) by mouth at bedtime.  . colestipol (COLESTID) 1 g tablet Take 1 tablet (1 g total) by mouth 2 (two) times daily. (Patient not taking: Reported on 11/23/2017)  . glimepiride (AMARYL) 2 MG tablet Take 1 tablet (2 mg total) by mouth daily before breakfast. (Patient not taking: Reported on 12/07/2017)  . glucose blood (ACCU-CHEK AVIVA PLUS) test strip Use aTest blood sugar once daily  . glucose blood (ONETOUCH VERIO) test strip Test BID and prn  e11.9 (Patient not taking: Reported on 12/07/2017)  . Insulin Glargine (LANTUS SOLOSTAR) 100 UNIT/ML Solostar Pen Inject 20 Units into the skin daily at 10 pm. (Patient not taking: Reported on 12/07/2017)  . ONETOUCH DELICA LANCETS 33G MISC Check blood sugar BID and prn  E11.9 (Patient not taking: Reported on 12/07/2017)   No facility-administered encounter medications on file as of 12/08/2017.     Functional Status:  In your present state of health, do you have any difficulty performing the following activities: 11/23/2017  Hearing? N  Vision? N  Difficulty concentrating or making decisions? N  Walking or climbing stairs? Y  Comment uses cane for help with balance due to leg and back pain  Dressing or bathing? N  Doing errands, shopping? Y  Comment Husband or grandson goes with patient to appointments or grocery store  Preparing Food and eating ? N  Using the Toilet? N  In the past six months, have you accidently leaked urine? Y  Comment wears adult pull ups  Do you have problems with loss of bowel control? Y  Comment Feels urgency with bowel movements after eating, has had accidents  Managing your Medications? N  Managing your Finances? N  Housekeeping or managing your Housekeeping? N  Some recent data might be hidden    Fall/Depression Screening: Fall Risk  12/08/2017 11/23/2017 11/23/2017  Falls in the past year? No (No Data) Yes  Comment - Slipped and  fell in bath tub, now has hand rails and a shower chair to prevent falls -  Number falls in past yr: - - 1  Injury with Fall? No - No  Risk Factor Category  - - -  Risk for fall due to : - - Impaired balance/gait  Follow up - - Education provided;Falls prevention discussed   PHQ 2/9 Scores 12/08/2017 11/29/2017 11/23/2017 11/18/2017 04/18/2017 01/10/2017 11/19/2016  PHQ - 2 Score 2 3 1  0 2 0 0  PHQ- 9 Score 11 13 - - 8 - -  Exception Documentation - - - - Other- indicate reason in comment box - -  Not completed - - - - alot going on in family; doesn't have energy - -    Assessment: Patient will benefit from health coach outreach for disease management and support.   [facility] CM Care Plan Problem One     Most Recent Value  Care Plan Problem One  knowlege deficit about diease management  Role Documenting the Problem One  Health Coach  Care Plan for Problem One  Active  [facility] Long Term Goal   In 90 days the patient will lower her a1c 1-2 points  [facility] Long Term Goal Start Date  12/08/17  Interventions for Problem One Long Term Goal  RN Health Coach will send the patient educational material   [facility] CM Short Term Goal #1   In 30 days the patient will verbalize discussing all of her health problems with her physician.  [facility] CM Short Term Goal #1 Start Date  12/08/17  Interventions for Short Term Goal #1  RN Health Coach discussed with the patient about the importance of informing her physician about her health  [facility] CM Short Term Goal #2   In 30 days the patient will verbalize that she is recording her blood sugars and foods on a regular basis.  [facility] CM Short Term Goal #2 Start Date  12/08/17  Interventions for Short Term Goal #2  RN Health discussed with the patient the benefits of recording her blood sugars and food intake.       Plan: RN Health Coach will provide ongoing education for patient on diabetes through phone calls and sending printed information to patient for further discussion.  RN Health Coach  will send welcome packet with consent to patient as well as printed information on diabetes.  RN Health Coach will send initial barriers letter, assessment, and care plan to primary care physician. RN Health Coach made a referral to Social Work for food resources.RN Health Coach will contact patient in the month of February and patient agrees to next outreach.  [name] RN, BSN, [facility] RN Health Coach Disease Management [facility] Dial:  [phone] Fax: [phone]

## 2017-12-12 ENCOUNTER — Other Ambulatory Visit: Payer: Self-pay | Admitting: Licensed Clinical Social Worker

## 2017-12-12 NOTE — Patient Outreach (Signed)
Assessment:  CSW received referral on [name] , Date of Birth 10/25/1961.CSW was requested to contact client to provide client with information on food agencies and food resources in area.  CSW called [name] on 12/12/17 and spoke via phone with [name]. CSW verified identity of [name]. CSW received verbal permission from [name] on 12/12/17 for CSW to speak with [name] about food agencies in area and about food resources in the area.  CSW informed [name] that she could contact [facility], [address], [place], [place] at phone number [phone] to inquire about food support. [name] said she was familiar with that food pantry agency. CSW also informed [name] that she could call [facility], [address], [place], [place] at phone number [phone] to inquire about applying for food assistance. CSW informed [name] that she would need to complete a short application for food assistance at [facility].  [name] wrote down the names, addresses and phone numbers of both of these food resources agencies. She plans to contact [facility] soon to learn more about applying for food support with that agency.  CSW also gave [name] CSW name and phone number  ([phone]).  [name] said she did not have further social work needs at present. She was appreciative of food support information provided by CSW to her on 12/12/17. CSW is closing out, discharging client from CSW support on 12/12/17 since client has no further CSW needs.  Plan:  [name] to contact both food resources agencies listed above in next 2 weeks to determine food assistance available for client through these food assistance agencies.   Kelton PillarMichael S.Rishit Burkhalter MSW, LCSW Licensed Clinical Social Worker [facility] [phone]

## 2017-12-13 NOTE — Progress Notes (Signed)
This encounter was created in error - please disregard.

## 2017-12-15 ENCOUNTER — Ambulatory Visit: Payer: Self-pay | Admitting: Pharmacist

## 2017-12-19 ENCOUNTER — Encounter: Payer: Self-pay | Admitting: Pediatrics

## 2017-12-19 ENCOUNTER — Ambulatory Visit (INDEPENDENT_AMBULATORY_CARE_PROVIDER_SITE_OTHER): Payer: PPO | Admitting: Pediatrics

## 2017-12-19 VITALS — BP 134/75 | HR 71 | Temp 97.2°F | Ht 62.0 in | Wt 317.6 lb

## 2017-12-19 DIAGNOSIS — Z5941 Food insecurity: Secondary | ICD-10-CM

## 2017-12-19 DIAGNOSIS — Z594 Lack of adequate food and safe drinking water: Secondary | ICD-10-CM | POA: Diagnosis not present

## 2017-12-19 DIAGNOSIS — I10 Essential (primary) hypertension: Secondary | ICD-10-CM

## 2017-12-19 DIAGNOSIS — E119 Type 2 diabetes mellitus without complications: Secondary | ICD-10-CM | POA: Diagnosis not present

## 2017-12-19 DIAGNOSIS — K915 Postcholecystectomy syndrome: Secondary | ICD-10-CM

## 2017-12-19 MED ORDER — COLESTIPOL HCL 1 G PO TABS: 2.0000 g | ORAL_TABLET | Freq: Two times a day (BID) | ORAL | 4 refills | Status: DC

## 2017-12-19 MED ORDER — SITAGLIPTIN PHOSPHATE 100 MG PO TABS: 100.0000 mg | ORAL_TABLET | Freq: Every day | ORAL | 5 refills | Status: DC

## 2017-12-19 NOTE — Patient Instructions (Signed)
Increasing colestipol to 2g twice a day to help with diarrhea

## 2017-12-19 NOTE — Progress Notes (Signed)
Subjective:   Patient ID: [name], female    DOB: 11/08/1961, 57 y.o.   MRN: [id number] CC: Follow-up (4 week); Leg Pain (Bilateral); and Back Pain (lower)  HPI: [name] is a 57 y.o. female presenting for Follow-up (4 week); Leg Pain (Bilateral); and Back Pain (lower)  Has tension headaches most days, last for minutes to hours. Takes tylenol which eases the headache  Pain in her lower back and legs  Lower back feels like someone pulling on her back bone, burns when she walks at times. When she goes to bed hips start hurting. Knees bother her when she is standing for periods of time. Not taking anything for the pain now  Not eating breakfast she says because she does not feel hungry. First eats around 630pm. Lives alone with her husband. Sometimes green beans, potatoes or a sandwich for dinner. Not snacking regularly.  She and her husband do have a hard time accessing food.  She has been contacting resources given to [facility] from [facility] social [facility].  It will be some months before 1 of them is able to add her to the program.  OSA: has been without oxygen prescribed per her recent sleep study for about 9 months. Insurance was changed and she couldn't afford monthly payments for oxygen so it was removed over last summer.  She does still have the sleep machine and has been using it.  Diabetes: Morning fasting blood sugars in the low 200s.  Sometimes down into the upper 100s.  Taking 2 metformin in the morning and 1 glimepiride.  She says if she takes the metformin in the evening she has stomach upset nausea.  Food tends to set off her diarrhea.  Depression screen [facility] 2[facility] 12/19/2017 12/08/2017 11/29/2017 11/23/2017 11/18/2017  Decreased Interest 0 0 0 0 0  Down, Depressed, Hopeless 0 2 3 1  0  PHQ - 2 Score 0 2 3 1  0  Altered sleeping - 1 1 - -  Tired, decreased energy - 3 3 - -  Change in appetite - 3 3 - -  Feeling bad or failure about yourself  - 1 3 - -  Trouble concentrating - 0 0 - -    Moving slowly or fidgety/restless - 0 0 - -  Suicidal thoughts - 1 0 - -  PHQ-9 Score - 11 13 - -  Difficult doing work/chores - Somewhat difficult Somewhat difficult - -     Relevant past medical, surgical, family and social history reviewed. Allergies and medications reviewed and updated. Social History   Tobacco Use  Smoking Status Current Every Day Smoker  . Packs/day: 1.50  . Years: 34.00  . Pack years: 51.00  . Types: Cigarettes  Smokeless Tobacco Never Used   ROS: Per HPI   Objective:    BP 134/75   Pulse 71   Temp (!) 97.2 F (36.2 C) (Oral)   Ht 5\' 2"  (1.575 m)   Wt (!) 317 lb 9.6 oz (144.1 kg)   BMI 58.09 kg/m   Wt Readings from Last 3 Encounters:  12/19/17 (!) 317 lb 9.6 oz (144.1 kg)  11/23/17 (!) 313 lb (142 kg)  11/18/17 (!) 317 lb 6.4 oz (144 kg)    Gen: NAD, alert, cooperative with exam, NCAT EYES: EOMI, no conjunctival injection, or no icterus ENT:  OP without erythema LYMPH: no cervical LAD CV: NRRR, normal S1/S2, no murmur, distal pulses 2+ b/l Resp: CTABL, no wheezes, normal WOB Abd: +BS, soft, NTND.  no guarding or organomegaly Ext: No edema, warm Neuro: Alert and oriented MSK: normal muscle bulk  Assessment & Plan:  Lashara was seen today for follow-up, leg pain and back pain.  Diagnoses and all orders for this visit:  Diabetes mellitus without complication (HCC) Sent in [place], taking glimeperide, metformin at home -     sitaGLIPtin (JANUVIA) 100 MG tablet; Take 1 tablet (100 mg total) by mouth daily.  Post-cholecystectomy syndrome Use below as needed twice a day -     colestipol (COLESTID) 1 g tablet; Take 2 tablets (2 g total) by mouth 2 (two) times daily.  Essential hypertension, benign Stable, cont current meds  Food insecurity List of food resources in the county given. Social work with [facility] also following.  Nocturnal hypoxia Not able to afford oxygen monthly copayments, will message [facility]   Leg pain Take tylenol as  needed, can take aleve with food as needed. Will continue to work on weight loss and DM2 control.   Follow up plan: Return in about 2 months (around 02/16/2018). Rex Kras, MD [place]

## 2017-12-21 ENCOUNTER — Telehealth: Payer: Self-pay | Admitting: Pharmacist

## 2017-12-21 NOTE — Telephone Encounter (Signed)
-----   Message from Johna Sheriffarol L [name], MD sent at 12/19/2017 12:49 PM EST ----- Regarding: DM2 meds Hi Laurelyn SickleKatina,  Thanks for the message. I sent in Januvia.  Confirmed with her what medicines she is taking at home.  She is pretty sure she is taking both glimeperide and 2 tabs metformin in the morning. She didn't tolerate increase to BID metformin due to stomach problems.  Okey Regalarol   ----- Message ----- From: Beecher McardleBoyd, Delorice Bannister J, [facility] Sent: 12/07/2017   2:58 PM To: Johna Sheriffarol L [name], MD  Dr. Oswaldo DoneVincent,   Please see the attached [facility] Pharmacist's note. [name] was referred for [facility] due to medication affordability.  She is unable to afford the copay for Lantus and she does not qualify for the [employer] Patient Assistance Program at this time because she has not spent at least $1000 out of pocket.  However, it looks like she was also prescribed Januvia but it was discontinued due to cost. Januvia is pretty easy to obtain from [employer] Patient Assistance Program. Patient was also confused about the price of Glimepiride so she was not taking it. Glimepiride actually has a copay of $0.60.   If deemed therapeutically appropriate, would you consider restarting Januvia and signing paperwork so we can try to obtain Januvia from [employer]?  Also due to cost, could be the patient's diabetes regimen include: glimepiride, Januvia, and dose optimized metformin? (Perhaps as the year progresses, the patient will have more out-of-pocket medication expenses.)  Thank you so much for your time and consideration.  Blessings,  Beecher McardleKatina J. Aundre Hietala, PharmD, BCACP [facility] Clinical Pharmacist [phone]   Thank you so much for your time and consideration.  Blessings,  Beecher McardleKatina J. Nickolaus Bordelon, PharmD, [facility] Clinical Pharmacist [phone]

## 2017-12-21 NOTE — Patient Outreach (Signed)
[facility]N) Care Management  12/21/2017  Stacey PufferBetty J [name] 12/30/1960 [id number]   Patient called regarding medication assistance. HIPAA identifiers were obtained. I received an inbasket message back from Dr. Oswaldo DoneVincent clarifying the patient's insulin regimen and notification that she was restarting Januvia and willing to sign the patient assistance application.  The patient assistance process was explained to the patient.   Patient has [employer] Insurance and is already qualified for partial extra help (LIS).    Plan: A letter will be routed to [facility] Walker, CPhT to send an application for Januvia to the patient and follow up .    Beecher McardleKatina J. Mairen Wallenstein, PharmD, BCACP [facility] Clinical Pharmacist [phone]

## 2017-12-22 ENCOUNTER — Ambulatory Visit: Payer: Self-pay | Admitting: Pharmacist

## 2017-12-24 ENCOUNTER — Other Ambulatory Visit: Payer: Self-pay

## 2017-12-24 NOTE — Patient Outreach (Signed)
[facility]N) Care Management  12/24/2017  Stacey PufferBetty J [name] 11/26/1960 [id number]   RN Health Coach received notification request from Dr. Oswaldo DoneVincent to help the patient with  oxygen to connect with her CPAP on 12-19-2017. RN Health Coach sent a message in collaboration to [employer] forrest LCSW for resources., as well as placing a call to liaison Minus LibertyMichelle Stenson with [facility]. On 12-23-2017. Unable to find a resolution I spoke with the UM department at [facility]. for my next steps.  I was advised to call member services with the insurance to clarify the patients benefits. The patient was included on a three way call  with member services and explained that the patient is responsible for a 20% co pay for oxygen rental.  She also wanted to know if the insurance will cover adult protective underwear and it is not.  She was  also informed that [facility] is in network for her.  Plan: RN Health Coach will do more research for the patient and inform her of the information.  Juanell Fairlyraci Rupa Lagan RN, BSN, [facility] RN Health Coach Disease Management [facility] Direct Dial:  [phone] Fax: [phone]

## 2017-12-24 NOTE — Patient Outreach (Signed)
[facility]N) Care Management  12/24/2017  Dorothy PufferBetty J Calkin 10/26/1961 [id number]

## 2017-12-26 ENCOUNTER — Other Ambulatory Visit: Payer: Self-pay

## 2017-12-26 NOTE — Patient Outreach (Signed)
[facility]) Care Management  12/26/2017  [name] 04/15/1961 [id number]   RN Health Coach placed a call to the patient for follow up.  HIPAA verified.  RN Health Coach informed the patient about information obtained about insurance.  RN Health Coach gave the patient the number to [facility] information program.  The patient can call and have all of her questions answered about her medical needs. The patient was notified that she has from January 1 to February 05, 2018 to make any changes if needed.    Plan:  RN Health Coach will follow up with the patient on our next call in March.  Juanell Fairlyraci Saia Derossett RN, BSN, [facility] RN Health Coach Disease Management [facility] Direct Dial:  [phone] Fax: [phone]

## 2018-01-04 ENCOUNTER — Other Ambulatory Visit: Payer: Self-pay

## 2018-01-04 NOTE — Patient Outreach (Signed)
[facility]) Care Management  [place]  01/04/2018   [name] 09-21-1961 [id number]  Subjective: Telephone call to the patient for monthly assessment.  HIPAA verified .  The patient states that she is doing fair.  She states that she is having pain in her lower back and legs.  She rates the pain a 7/10.  She denies any falls. The patient states that her blood sugar was 202 this morning but running from 178-230.  She states she does not understand why.  RN Health Coach talked with her about her diet and some of the carbohydrates that she is eating and how it can affect her a1c.  The patient verbalized understanding.  The patient is writing her blood sugars down on a regular basis.  She states that she is not able to do any exercise because of her legs.  RN Health talked with the patient about doing chair exercises.  The patient verbalized understanding.  The patient states that she has an appointment with her physician in April.   Encounter Medications:  Outpatient Encounter Medications as of 01/04/2018  Medication Sig  . aspirin EC 325 MG tablet Take 325 mg by mouth every morning.   . colestipol (COLESTID) 1 g tablet Take 2 tablets (2 g total) by mouth 2 (two) times daily.  . furosemide (LASIX) 40 MG tablet Take 1 tablet (40 mg total) by mouth daily.  Marland Kitchen gabapentin (NEURONTIN) 300 MG capsule Take 1 capsule (300 mg total) by mouth 3 (three) times daily.  Marland Kitchen glimepiride (AMARYL) 2 MG tablet Take 1 tablet (2 mg total) by mouth daily before breakfast.  . glucose blood (ACCU-CHEK AVIVA PLUS) test strip Use aTest blood sugar once daily  . glucose blood (ONETOUCH VERIO) test strip Test BID and prn  e11.9  . lisinopril (PRINIVIL,ZESTRIL) 10 MG tablet Take 1 tablet (10 mg total) by mouth daily.  . metFORMIN (GLUCOPHAGE) 500 MG tablet Take 2 tablets (1,000 mg total) by mouth daily with breakfast.  . nitroGLYCERIN (NITROSTAT) 0.4 MG SL tablet See AUX Label  . omeprazole  (PRILOSEC) 40 MG capsule TAKE 1 CAPSULE BY MOUTH ONCE DAILY 30 MINUTES BEFORE A MEAL  . ONETOUCH DELICA LANCETS 92Z MISC Check blood sugar BID and prn  E11.9  . rosuvastatin (CRESTOR) 20 MG tablet Take 1 tablet (20 mg total) by mouth at bedtime.  . sitaGLIPtin (JANUVIA) 100 MG tablet Take 1 tablet (100 mg total) by mouth daily.   No facility-administered encounter medications on file as of 01/04/2018.     Functional Status:  In your present state of health, do you have any difficulty performing the following activities: 11/23/2017  Hearing? N  Vision? N  Difficulty concentrating or making decisions? N  Walking or climbing stairs? Y  Comment uses cane for help with balance due to leg and back pain  Dressing or bathing? N  Doing errands, shopping? Y  Comment Husband or grandson goes with patient to appointments or grocery store  Preparing Food and eating ? N  Using the Toilet? N  In the past six months, have you accidently leaked urine? Y  Comment wears adult pull ups  Do you have problems with loss of bowel control? Y  Comment Feels urgency with bowel movements after eating, has had accidents  Managing your Medications? N  Managing your Finances? N  Housekeeping or managing your Housekeeping? N  Some recent data might be hidden    Fall/Depression Screening: Fall Risk  01/04/2018  12/19/2017 12/08/2017  Falls in the past year? No No No  Comment - - -  Number falls in past yr: - - -  Injury with Fall? - - No  Risk Factor Category  - - -  Risk for fall due to : - Impaired balance/gait -  Follow up - - -   PHQ 2/9 Scores 12/19/2017 12/08/2017 11/29/2017 11/23/2017 11/18/2017 04/18/2017 01/10/2017  PHQ - 2 Score 0 _0 0 2 0  PHQ- 9 Score - 11 13 - - 8 -  Exception Documentation - - - - - Other- indicate reason in comment box -  Not completed - - - - - alot going on in family; doesn't have energy -    Assessment: Patient continues to benefit from health coach outreach for disease  management and support.    [facility] CM Care Plan Problem One     Most Recent Value  [facility] Long Term Goal   In 90 days the patient will lower her a1c 1-2 points  [facility] Long Term Goal Start Date  01/04/18  Interventions for Problem One Long Term Goal  [facility] talked with the patient abou ththe carbohydrates in her diet andhow the affect her a1c.  [facility] CM Short Term Goal #1   In 30 days the patient will verbalize discussing all of her health problems with her physician.  [facility] CM Short Term Goal #1 Start Date  01/04/18  Interventions for Short Term Goal #1  [facility] asked the patient to disc uss all ofher health concerns with her phyician at her next appointment.  [facility] CM Short Term Goal #2   In 30 days the patient will verbalize that she is recording her blood sugars and foods on a regular basis.  [facility] CM Short Term Goal #2 Start Date  01/04/18  [facility] CM Short Term Goal #2 Met Date  01/04/18  Interventions for Short Term Goal #2  The patient read off the readings that she is recording for her blood sugars     Plan: RN Health Coach will contact patient in the month of March and patient agrees to next outreach.  Lazaro Arms RN, BSN, [place] Direct Dial:  [phone] Fax: [phone]

## 2018-01-06 ENCOUNTER — Ambulatory Visit: Payer: PPO | Admitting: Cardiology

## 2018-01-17 ENCOUNTER — Other Ambulatory Visit: Payer: Self-pay | Admitting: Pharmacy Technician

## 2018-01-17 NOTE — Patient Outreach (Signed)
[facility]) Care Management  01/17/2018  Stacey PufferBetty J [name] 06/27/1961 [id number]  [employer] patient assistance application for Januvia mailed to the patient and [name] [name] for completion.  [name], CPhT [facility] [phone]

## 2018-01-26 ENCOUNTER — Other Ambulatory Visit: Payer: Self-pay | Admitting: Pharmacy Technician

## 2018-01-26 ENCOUNTER — Telehealth: Payer: Self-pay | Admitting: Pediatrics

## 2018-01-26 NOTE — Telephone Encounter (Signed)
LMOVM paperwork was put in mail yesterday

## 2018-01-26 NOTE — Patient Outreach (Signed)
[facility]) Care Management  01/26/2018  [name] 03/03/1961 [id number]   Voicemail left by [name] from the office of Dr Oswaldo DoneVincent stating the patient's application was mailed yesterday.  Daryll Brodrystal Tou Hayner, CPhT [facility] [phone]

## 2018-01-26 NOTE — Patient Outreach (Signed)
[facility]) Care Management  01/26/2018  Stacey PufferBetty J [name] 08/29/1961 [id number]  Successful patient outreach call to follow up on a [employer] patient assistance application that was mailed 01/17/2018. HIPAA identifiers verified and verbal consent received. The patient states she has received the application but was unsure what it was for. I was able to explain the application process to her and that if she is approved she will receive Januvia free of cost for the remainder of the year. She is going to complete the application and mail it back to [employer] [facility] as soon as possible.  Outgoing call to the office of Dr [name] [name] to follow up on the provider portion of the application that was mailed to the office 01/17/2018. I left a message with Aram BeechamCynthia and she is going to route it to a nurse to contact me.  Daryll Brodrystal Pietrina Jagodzinski, CPhT [facility] [phone]

## 2018-02-01 NOTE — Progress Notes (Deleted)
Cardiology Office Note  Date: 02/01/2018   ID: [name], DOB 03/08/1961, MRN [id number]  PCP: Johna Sheriff, MD  Primary Cardiologist: Nona Dell, MD   No chief complaint on file.   History of Present Illness: [name] is a 58 y.o. female last seen in May 2018.  I reviewed her most recent lab work as outlined below.  Past Medical History:  Diagnosis Date  . Arthritis   . CAD (coronary artery disease)    BMS to circumflex 2007 - Dr. Juanda Chance  . CKD (chronic kidney disease) stage 3, GFR 30-59 ml/min (HCC)   . Essential hypertension   . Falls frequently   . GERD (gastroesophageal reflux disease)   . HA (headache)   . Hyperlipidemia   . Left-sided face pain   . Morbid obesity (HCC)   . Noncompliance   . OSA (obstructive sleep apnea)    Uses CPAP  . Type 2 diabetes mellitus (HCC)   . Urinary incontinence     Past Surgical History:  Procedure Laterality Date  . ABDOMINAL HERNIA REPAIR    . ABDOMINAL HYSTERECTOMY    . BACK SURGERY    . BREAST SURGERY     biopsy per right breast; pt states has silver piece in for marking   . Cataract surgery Bilateral   . CHOLECYSTECTOMY    . COLONOSCOPY N/A 04/10/2013   Procedure: COLONOSCOPY;  Surgeon: Malissa Hippo, MD;  Location: [facility];  Service: Endoscopy;  Laterality: N/A;  . LUMBAR LAMINECTOMY/DECOMPRESSION MICRODISCECTOMY N/A 04/25/2015   Procedure: CENTRAL LUMBAR /DECOMPRESSION L4-L5;  Surgeon: Ranee Gosselin, MD;  Location: [facility];  Service: Orthopedics;  Laterality: N/A;  . TUBAL LIGATION      Current Outpatient Medications  Medication Sig Dispense Refill  . aspirin EC 325 MG tablet Take 325 mg by mouth every morning.     . colestipol (COLESTID) 1 g tablet Take 2 tablets (2 g total) by mouth 2 (two) times daily. 120 tablet 4  . furosemide (LASIX) 40 MG tablet Take 1 tablet (40 mg total) by mouth daily. 30 tablet 1  . gabapentin (NEURONTIN) 300 MG capsule Take 1 capsule (300 mg total) by mouth 3  (three) times daily. 270 capsule 0  . glimepiride (AMARYL) 2 MG tablet Take 1 tablet (2 mg total) by mouth daily before breakfast. 30 tablet 3  . glucose blood (ACCU-CHEK AVIVA PLUS) test strip Use aTest blood sugar once daily 100 each 2  . glucose blood (ONETOUCH VERIO) test strip Test BID and prn  e11.9 100 each 11  . lisinopril (PRINIVIL,ZESTRIL) 10 MG tablet Take 1 tablet (10 mg total) by mouth daily. 90 tablet 0  . metFORMIN (GLUCOPHAGE) 500 MG tablet Take 2 tablets (1,000 mg total) by mouth daily with breakfast. 90 tablet 0  . nitroGLYCERIN (NITROSTAT) 0.4 MG SL tablet See AUX Label 75 tablet 0  . omeprazole (PRILOSEC) 40 MG capsule TAKE 1 CAPSULE BY MOUTH ONCE DAILY 30 MINUTES BEFORE A MEAL 90 capsule 0  . ONETOUCH DELICA LANCETS 33G MISC Check blood sugar BID and prn  E11.9 100 each 11  . rosuvastatin (CRESTOR) 20 MG tablet Take 1 tablet (20 mg total) by mouth at bedtime. 90 tablet 0  . sitaGLIPtin (JANUVIA) 100 MG tablet Take 1 tablet (100 mg total) by mouth daily. 30 tablet 5   No current facility-administered medications for this visit.    Allergies:  Morphine and related   Social History: The patient  reports that she has been smoking cigarettes.  She has a 51.00 pack-year smoking history. She has never used smokeless tobacco. She reports that she does not drink alcohol or use drugs.   Family History: The patient's family history includes Breast cancer in her mother; Cancer - Colon in her father; Coronary artery disease in her father; Diabetes in her father; High Cholesterol in her father.   ROS:  Please see the history of present illness. Otherwise, complete review of systems is positive for {NONE DEFAULTED:18576::"none"}.  All other systems are reviewed and negative.   Physical Exam: VS:  There were no vitals taken for this visit., BMI There is no height or weight on file to calculate BMI.  Wt Readings from Last 3 Encounters:  12/19/17 (!) 317 lb 9.6 oz (144.1 kg)  11/23/17  (!) 313 lb (142 kg)  11/18/17 (!) 317 lb 6.4 oz (144 kg)    General: Patient appears comfortable at rest. HEENT: Conjunctiva and lids normal, oropharynx clear with moist mucosa. Neck: Supple, no elevated JVP or carotid bruits, no thyromegaly. Lungs: Clear to auscultation, nonlabored breathing at rest. Cardiac: Regular rate and rhythm, no S3 or significant systolic murmur, no pericardial rub. Abdomen: Soft, nontender, no hepatomegaly, bowel sounds present, no guarding or rebound. Extremities: No pitting edema, distal pulses 2+. Skin: Warm and dry. Musculoskeletal: No kyphosis. Neuropsychiatric: Alert and oriented x3, affect grossly appropriate.  ECG: I personally reviewed the tracing from 04/01/2017 which showed sinus rhythm with leftward axis and incomplete right bundle branch block.  Recent Labwork: 11/18/2017: BUN 9; Creatinine, Ser 1.18; Potassium 4.3; Sodium 138     Component Value Date/Time   CHOL 120 11/18/2017 1019   TRIG 219 (H) 11/18/2017 1019   TRIG 211 (H) 12/30/2014 1537   HDL 38 (L) 11/18/2017 1019   HDL 48 12/30/2014 1537   CHOLHDL 3.2 11/18/2017 1019   CHOLHDL 4.5 04/09/2013 1135   VLDL 37 04/09/2013 1135   LDLCALC 38 11/18/2017 1019   LDLCALC 42 08/28/2014 1037    Other Studies Reviewed Today:  Lexiscan Myoview 04/01/2015:  There was no ST segment deviation noted during stress.  Defect 1: There is a medium defect of moderate severity present in the mid anterior and apical anterior location.  This is a low risk study.  Fixed anterior defect as outlined, most consistent with breast attenuation. LVEF 72%.  Assessment and Plan:    Current medicines were reviewed with the patient today.  No orders of the defined types were placed in this encounter.   Disposition:  Signed, Jonelle SidleSamuel G. Uma Jerde, MD, [facility] 02/01/2018 8:13 AM    [facility] [address], [place], [place] [address] Phone: [phone]; Fax: [phone]

## 2018-02-02 ENCOUNTER — Ambulatory Visit: Payer: PPO | Admitting: Cardiology

## 2018-02-03 ENCOUNTER — Other Ambulatory Visit: Payer: Self-pay

## 2018-02-03 NOTE — Patient Outreach (Signed)
[facility]) Care Management  [facility] Care Manager  02/03/2018   [name] 01/02/1961 [id number]  Subjective: Telephone call placed to the patient for monthly assessment. HIPAA verified.  The patient states that she is doing ok.  The patient states that she has pain and rates it a 5/10.  She has not had any falls.  The patient states that her blood sugars have been running between 180-220.  She states that she has not taken Januvia and insulin prescribed for her.  She is working with [facility] pharmacy to help her receive the medications.  She is taking all of the other medications.   She states that she is watching her food and fluid intake.  She does some exercise in her home doing chores.   Encounter Medications:  Outpatient Encounter Medications as of 02/03/2018  Medication Sig  . aspirin EC 325 MG tablet Take 325 mg by mouth every morning.   . colestipol (COLESTID) 1 g tablet Take 2 tablets (2 g total) by mouth 2 (two) times daily.  . furosemide (LASIX) 40 MG tablet Take 1 tablet (40 mg total) by mouth daily.  Marland Kitchen gabapentin (NEURONTIN) 300 MG capsule Take 1 capsule (300 mg total) by mouth 3 (three) times daily.  Marland Kitchen glimepiride (AMARYL) 2 MG tablet Take 1 tablet (2 mg total) by mouth daily before breakfast.  . glucose blood (ACCU-CHEK AVIVA PLUS) test strip Use aTest blood sugar once daily  . glucose blood (ONETOUCH VERIO) test strip Test BID and prn  e11.9  . lisinopril (PRINIVIL,ZESTRIL) 10 MG tablet Take 1 tablet (10 mg total) by mouth daily.  . metFORMIN (GLUCOPHAGE) 500 MG tablet Take 2 tablets (1,000 mg total) by mouth daily with breakfast.  . nitroGLYCERIN (NITROSTAT) 0.4 MG SL tablet See AUX Label  . omeprazole (PRILOSEC) 40 MG capsule TAKE 1 CAPSULE BY MOUTH ONCE DAILY 30 MINUTES BEFORE A MEAL  . ONETOUCH DELICA LANCETS 33G MISC Check blood sugar BID and prn  E11.9  . rosuvastatin (CRESTOR) 20 MG tablet Take 1 tablet (20 mg total) by mouth at bedtime.  . sitaGLIPtin  (JANUVIA) 100 MG tablet Take 1 tablet (100 mg total) by mouth daily. (Patient not taking: Reported on 02/03/2018)   No facility-administered encounter medications on file as of 02/03/2018.     Functional Status:  In your present state of health, do you have any difficulty performing the following activities: 11/23/2017  Hearing? N  Vision? N  Difficulty concentrating or making decisions? N  Walking or climbing stairs? Y  Comment uses cane for help with balance due to leg and back pain  Dressing or bathing? N  Doing errands, shopping? Y  Comment Husband or grandson goes with patient to appointments or grocery store  Preparing Food and eating ? N  Using the Toilet? N  In the past six months, have you accidently leaked urine? Y  Comment wears adult pull ups  Do you have problems with loss of bowel control? Y  Comment Feels urgency with bowel movements after eating, has had accidents  Managing your Medications? N  Managing your Finances? N  Housekeeping or managing your Housekeeping? N  Some recent data might be hidden    Fall/Depression Screening: Fall Risk  02/03/2018 01/04/2018 12/19/2017  Falls in the past year? No No No  Comment - - -  Number falls in past yr: - - -  Injury with Fall? - - -  Risk Factor Category  - - -  Risk  for fall due to : - - Impaired balance/gait  Follow up - - -   PHQ 2/9 Scores 12/19/2017 12/08/2017 11/29/2017 11/23/2017 11/18/2017 04/18/2017 01/10/2017  PHQ - 2 Score 0 2 3 1  0 2 0  PHQ- 9 Score - 11 13 - - 8 -  Exception Documentation - - - - - Other- indicate reason in comment box -  Not completed - - - - - alot going on in family; doesn't have energy -    Assessment: Patient continues to benefit from health coach outreach for disease management and support.   THN CM Care Plan Problem One     Most Recent Value  THN Long Term Goal   In 90 days the patient will lower her a1c 1-2 points  Interventions for Problem One Long Term Goal  [facility] discussed with the  patient about her food and fluid intake  THN CM Short Term Goal #1   In 30 days the patient will verbalize discussing all of her health problems with her physician.  THN CM Short Term Goal #1 Start Date  02/03/18  Interventions for Short Term Goal #1  [facility] reinterated to discuss her health conditions with her primary     Plan: RN Health Coach will contact patient in the month of April  and patient agrees to next outreach.            Juanell Fairlyraci Dorethea Strubel RN, BSN, [facility] RN Health Coach Disease Management [facility] Direct Dial:  [phone]  Fax: [phone]

## 2018-02-06 ENCOUNTER — Other Ambulatory Visit: Payer: Self-pay | Admitting: Pharmacy Technician

## 2018-02-06 NOTE — Patient Outreach (Signed)
[facility]) Care Management  02/06/2018  [name] 06/27/1961 [id number]   Successful outreach call to [name], HIPAA identifiers verified. Called in reference to patient portion of [employer] patient assistance application. Patient stated that she received it and mailed in back in Friday.  Will follow up with patient when I mail completed application to [employer].  Suzan SlickAshley N. Ernesta Ambleoleman, CPhT [facility] Care Management [phone]

## 2018-02-20 ENCOUNTER — Encounter: Payer: Self-pay | Admitting: Pediatrics

## 2018-02-20 ENCOUNTER — Ambulatory Visit (INDEPENDENT_AMBULATORY_CARE_PROVIDER_SITE_OTHER): Payer: PPO | Admitting: Pediatrics

## 2018-02-20 ENCOUNTER — Telehealth: Payer: Self-pay | Admitting: Pediatrics

## 2018-02-20 VITALS — BP 131/76 | HR 79 | Temp 97.2°F | Ht 62.0 in | Wt 304.0 lb

## 2018-02-20 DIAGNOSIS — I10 Essential (primary) hypertension: Secondary | ICD-10-CM | POA: Diagnosis not present

## 2018-02-20 DIAGNOSIS — E1142 Type 2 diabetes mellitus with diabetic polyneuropathy: Secondary | ICD-10-CM | POA: Diagnosis not present

## 2018-02-20 DIAGNOSIS — E785 Hyperlipidemia, unspecified: Secondary | ICD-10-CM

## 2018-02-20 DIAGNOSIS — Z794 Long term (current) use of insulin: Secondary | ICD-10-CM | POA: Diagnosis not present

## 2018-02-20 DIAGNOSIS — E118 Type 2 diabetes mellitus with unspecified complications: Secondary | ICD-10-CM | POA: Diagnosis not present

## 2018-02-20 DIAGNOSIS — K219 Gastro-esophageal reflux disease without esophagitis: Secondary | ICD-10-CM

## 2018-02-20 DIAGNOSIS — I2510 Atherosclerotic heart disease of native coronary artery without angina pectoris: Secondary | ICD-10-CM | POA: Diagnosis not present

## 2018-02-20 LAB — BAYER DCA HB A1C WAIVED: HB A1C (BAYER DCA - WAIVED): 8.3 % — ABNORMAL HIGH (ref <7.0)

## 2018-02-20 MED ORDER — GABAPENTIN 300 MG PO CAPS: 300.0000 mg | ORAL_CAPSULE | Freq: Three times a day (TID) | ORAL | 1 refills | Status: DC

## 2018-02-20 MED ORDER — ROSUVASTATIN CALCIUM 20 MG PO TABS: 20.0000 mg | ORAL_TABLET | Freq: Every day | ORAL | 3 refills | Status: DC

## 2018-02-20 MED ORDER — METFORMIN HCL 500 MG PO TABS: 1000.0000 mg | ORAL_TABLET | Freq: Two times a day (BID) | ORAL | 1 refills | Status: DC

## 2018-02-20 MED ORDER — LISINOPRIL 10 MG PO TABS: 10.0000 mg | ORAL_TABLET | Freq: Every day | ORAL | 3 refills | Status: DC

## 2018-02-20 MED ORDER — METOPROLOL TARTRATE 25 MG PO TABS: 25.0000 mg | ORAL_TABLET | Freq: Two times a day (BID) | ORAL | 3 refills | Status: DC

## 2018-02-20 MED ORDER — NITROGLYCERIN 0.4 MG SL SUBL: SUBLINGUAL_TABLET | SUBLINGUAL | 0 refills | Status: DC

## 2018-02-20 MED ORDER — SITAGLIPTIN PHOSPHATE 100 MG PO TABS: 100.0000 mg | ORAL_TABLET | Freq: Every day | ORAL | 5 refills | Status: DC

## 2018-02-20 MED ORDER — OMEPRAZOLE 40 MG PO CPDR: DELAYED_RELEASE_CAPSULE | ORAL | 3 refills | Status: DC

## 2018-02-20 MED ORDER — FUROSEMIDE 40 MG PO TABS: 40.0000 mg | ORAL_TABLET | Freq: Every day | ORAL | 1 refills | Status: DC

## 2018-02-20 NOTE — Telephone Encounter (Signed)
One a day, pt aware

## 2018-02-20 NOTE — Progress Notes (Signed)
Subjective:   Patient ID: [name], female    DOB: 12/17/1960, 57 y.o.   MRN: [id number] CC: Follow-up (2 month) Multiple medical problems. HPI: [name] is a 57 y.o. female presenting for Follow-up (2 month)  DM2: Morning blood sugars upper 100s at best, low 200s most of the time.  Has filled out paperwork to get Januvia, has not yet received it.  Aching in her chest when she gets upset, most recently happened this weekend.  Took an extra aspirin and thinks that that helped.  Points to the center into the right upper chest with where the pain is.  Taking a deep breath sometimes makes the pain worse.  Has known coronary artery disease.  An upcoming appoint with a cardiologist within the next couple weeks.  Has had to reschedule several times. Says her nitroglycerin is out of date, has not tried any.  Sometimes feels short of breath with exertion, chest pain does not get any worse with exertion.  Worsening chest pain not related to eating, or before mealtime.  No chest pain right now.   Over the past few weeks has become more short of breath with exertion, primarily walking around at home.  A couple months ago she did not have any shortness of breath getting around from the bedroom to the kitchen.  Now she has to stop to catch her breath  GERD: Taking omeprazole regularly.  Relevant past medical, surgical, family and social history reviewed. Allergies and medications reviewed and updated. Social History   Tobacco Use  Smoking Status Current Every Day Smoker  . Packs/day: 1.50  . Years: 34.00  . Pack years: 51.00  . Types: Cigarettes  Smokeless Tobacco Never Used   ROS: Per HPI   Objective:    BP 131/76   Pulse 79   Temp (!) 97.2 F (36.2 C) (Oral)   Ht 5\' 2"  (1.575 m)   Wt (!) 304 lb (137.9 kg)   BMI 55.60 kg/m   Wt Readings from Last 3 Encounters:  02/20/18 (!) 304 lb (137.9 kg)  12/19/17 (!) 317 lb 9.6 oz (144.1 kg)  11/23/17 (!) 313 lb (142 kg)    Gen: NAD,  alert, cooperative with exam, NCAT EYES: EOMI, no conjunctival injection, or no icterus ENT:   OP without erythema LYMPH: no cervical LAD CV: NRRR, normal S1/S2, no murmur, distal pulses 2+ b/l Resp: CTABL, no wheezes, normal WOB Abd: +BS, soft, NTND. no guarding or organomegaly Ext: Nonpitting edema present.  Neuro: Alert and oriented  Assessment & Plan:  [name] was seen today for follow-up multiple medical problems.  Diagnoses and all orders for this visit:  Essential hypertension, benign Adequate control today.  Continue current medicines.  Check at home as able. -     furosemide (LASIX) 40 MG tablet; Take 1 tablet (40 mg total) by mouth daily. -     lisinopril (PRINIVIL,ZESTRIL) 10 MG tablet; Take 1 tablet (10 mg total) by mouth daily. -     Basic Metabolic Panel  Type 2 diabetes mellitus with diabetic polyneuropathy, without long-term current use of insulin (HCC) Uncontrolled, A1c 8.3, improved from 9.7.  Increase metformin.  Gave samples of Januvia to go ahead and start today. -     Bayer DCA Hb A1c Waived -     gabapentin (NEURONTIN) 300 MG capsule; Take 1 capsule (300 mg total) by mouth 3 (three) times daily. -     metFORMIN (GLUCOPHAGE) 500 MG tablet; Take 2 tablets (1,000  mg total) by mouth 2 (two) times daily with a meal. -     sitaGLIPtin (JANUVIA) 100 MG tablet; Take 1 tablet (100 mg total) by mouth daily.  Gastroesophageal reflux disease without esophagitis -     omeprazole (PRILOSEC) 40 MG capsule; TAKE 1 CAPSULE BY MOUTH ONCE DAILY 30 MINUTES BEFORE A MEAL  Hyperlipidemia with target LDL less than 100 -     rosuvastatin (CRESTOR) 20 MG tablet; Take 1 tablet (20 mg total) by mouth at bedtime.  Coronary artery disease involving native coronary artery of native heart, angina presence unspecified No chest pain now.  Any chest pain, aching in the chest unrelieved by nitroglycerin patient is to be seen immediately.  Has upcoming appointment cardiology.  On an aspirin.  We  will add beta-blocker.  Working on improved control with diabetes.  Affording medicines has been hard, now getting assistance. -     nitroGLYCERIN (NITROSTAT) 0.4 MG SL tablet; See AUX Label -     metoprolol tartrate (LOPRESSOR) 25 MG tablet; Take 1 tablet (25 mg total) by mouth 2 (two) times daily.   Follow up plan: Return in about 1 month (around 03/20/2018).  Return precautions discussed. Rex Kras, MD [facility]

## 2018-02-21 LAB — BASIC METABOLIC PANEL
BUN/Creatinine Ratio: 7 — ABNORMAL LOW (ref 9–23)
BUN: 9 mg/dL (ref 6–24)
CO2: 24 mmol/L (ref 20–29)
Calcium: 9.3 mg/dL (ref 8.7–10.2)
Chloride: 100 mmol/L (ref 96–106)
Creatinine, Ser: 1.21 mg/dL — ABNORMAL HIGH (ref 0.57–1.00)
GFR calc Af Amer: 57 mL/min/{1.73_m2} — ABNORMAL LOW (ref >59)
GFR calc non Af Amer: 49 mL/min/{1.73_m2} — ABNORMAL LOW (ref >59)
Glucose: 157 mg/dL — ABNORMAL HIGH (ref 65–99)
Potassium: 4 mmol/L (ref 3.5–5.2)
Sodium: 139 mmol/L (ref 134–144)

## 2018-02-27 ENCOUNTER — Other Ambulatory Visit: Payer: Self-pay | Admitting: Pharmacy Technician

## 2018-02-27 NOTE — Patient Outreach (Signed)
[facility]) Care Management  02/27/2018  [name] 09/29/1961 [id number]   Successful outreach call to patient in reference to [employer] patient portion of application, HIPAA identifiers verified. Informed patient that I still have not received her portion of application in the mail so I am going to mail out a new application on 04/23.   Will  follow up with patient in the next 2 weeks to verify receipt of application.  Suzan SlickAshley N. Ernesta Ambleoleman, CPhT [facility] Care Management [phone]

## 2018-03-07 ENCOUNTER — Other Ambulatory Visit: Payer: Self-pay

## 2018-03-07 NOTE — Patient Outreach (Signed)
[facility]) Care Management  [place]  03/07/2018   [name] [birth year] [id number]  Subjective: Telephone call to the patient for monthly assessment. HIPAA verified.  The patient states that she is doing well.  She denies any falls but has some achiness in her legs.  She states that she did not check her blood sugar this morning but yesterday it was 238.  She states that she does not eat three meals daily that dinner is her main meal.  She states that she will  snack but does not eat a full meal.  [place] talked with the patient about the importance of having a well balanced diet. She is adherent with her medications. The patient states that she has started taking Januvia and her Metformin has been changed to twice a day at her appointment on 4/15.  Encounter Medications:  Outpatient Encounter Medications as of 03/07/2018  Medication Sig  . aspirin EC 325 MG tablet Take 325 mg by mouth every morning.   . colestipol (COLESTID) 1 g tablet Take 2 tablets (2 g total) by mouth 2 (two) times daily.  . furosemide (LASIX) 40 MG tablet Take 1 tablet (40 mg total) by mouth daily.  Marland Kitchen gabapentin (NEURONTIN) 300 MG capsule Take 1 capsule (300 mg total) by mouth 3 (three) times daily.  Marland Kitchen glimepiride (AMARYL) 2 MG tablet Take 1 tablet (2 mg total) by mouth daily before breakfast.  . glucose blood (ACCU-CHEK AVIVA PLUS) test strip Use aTest blood sugar once daily  . glucose blood (ONETOUCH VERIO) test strip Test BID and prn  e11.9  . lisinopril (PRINIVIL,ZESTRIL) 10 MG tablet Take 1 tablet (10 mg total) by mouth daily.  . metFORMIN (GLUCOPHAGE) 500 MG tablet Take 2 tablets (1,000 mg total) by mouth 2 (two) times daily with a meal.  . metoprolol tartrate (LOPRESSOR) 25 MG tablet Take 1 tablet (25 mg total) by mouth 2 (two) times daily.  . nitroGLYCERIN (NITROSTAT) 0.4 MG SL tablet See AUX Label  . omeprazole (PRILOSEC) 40 MG capsule TAKE 1 CAPSULE BY MOUTH ONCE DAILY 30  MINUTES BEFORE A MEAL  . ONETOUCH DELICA LANCETS 52D MISC Check blood sugar BID and prn  E11.9  . rosuvastatin (CRESTOR) 20 MG tablet Take 1 tablet (20 mg total) by mouth at bedtime.  . sitaGLIPtin (JANUVIA) 100 MG tablet Take 1 tablet (100 mg total) by mouth daily.   No facility-administered encounter medications on file as of 03/07/2018.     Functional Status:  In your present state of health, do you have any difficulty performing the following activities: 11/23/2017  Hearing? N  Vision? N  Difficulty concentrating or making decisions? N  Walking or climbing stairs? Y  Comment uses cane for help with balance due to leg and back pain  Dressing or bathing? N  Doing errands, shopping? Y  Comment Husband or grandson goes with patient to appointments or grocery store  Preparing Food and eating ? N  Using the Toilet? N  In the past six months, have you accidently leaked urine? Y  Comment wears adult pull ups  Do you have problems with loss of bowel control? Y  Comment Feels urgency with bowel movements after eating, has had accidents  Managing your Medications? N  Managing your Finances? N  Housekeeping or managing your Housekeeping? N  Some recent data might be hidden    Fall/Depression Screening: Fall Risk  03/07/2018 03/07/2018 02/20/2018  Falls in the past year? No  No No  Comment - - -  Number falls in past yr: - - -  Injury with Fall? - - -  Risk Factor Category  - - -  Risk for fall due to : - - -  Follow up - - -   PHQ 2/9 Scores 02/20/2018 12/19/2017 12/08/2017 11/29/2017 11/23/2017 11/18/2017 04/18/2017  PHQ - 2 Score 2 0 '2 3 1 ' 0 2  PHQ- 9 Score 12 - 11 13 - - 8  Exception Documentation - - - - - - Other- indicate reason in comment box  Not completed - - - - - - alot going on in family; doesn't have energy    Assessment: Patient will continue to benefit from health coach outreach for disease management and support.   THN CM Care Plan Problem One     Most Recent Value  [facility]  Long Term Goal   In 90 days the patient will lower her a1c from 8.3 1-2 points  [facility] Long Term Goal Start Date  03/07/18  Interventions for Problem One Long Term Goal  [facility] reinterted to the patient about food intake and ashrence with medications  [facility] CM Short Term Goal #1   In 30 days the patient will verbalize discussing all of her health problems with her physician.  [facility] CM Short Term Goal #1 Start Date  03/07/18  [facility] CM Short Term Goal #1 Met Date  03/07/18  Interventions for Short Term Goal #1  pateint states that she had an appointment with her physician on 4/15 and discussed concerns      Plan: RN Health Coach will contact patient in the month of May and patient agrees to next outreach.  Lazaro Arms RN, BSN, [place] Direct Dial:  [phone]  Fax: [phone]

## 2018-03-13 NOTE — Progress Notes (Signed)
Cardiology Office Note  Date: 03/15/2018   ID: [name], DOB 01/14/1961, MRN [id number]  PCP: Johna Sheriff, MD  Primary Cardiologist: Nona Dell, MD   Chief Complaint  Patient presents with  . Coronary Artery Disease    History of Present Illness: [name] is a 57 y.o. female last seen in May 2018.  She presents for a follow-up visit.  In the interim reports increasing episodes of discomfort in her chest, does not feel like it is indigestion, although she does not consistently take nitroglycerin when this happens.  She cannot identify any specific activity that brings on the symptoms.  No palpitations or syncope.  I reviewed her medications.  Cardiac regimen includes aspirin, Lasix, lisinopril, Lopressor, Crestor, and as needed nitroglycerin.  I reviewed her most recent lab work, LDL 38 in January.  Creatinine 1.21 in April.  I personally reviewed her ECG today which shows sinus rhythm with low voltage, incomplete right bundle branch block, poor R wave progression, and prolonged QT interval.  Last ischemic testing was in 2016 as outlined below.  Past Medical History:  Diagnosis Date  . Arthritis   . CAD (coronary artery disease)    BMS to circumflex 2007 - Dr. Juanda Chance  . CKD (chronic kidney disease) stage 3, GFR 30-59 ml/min (HCC)   . Essential hypertension   . Falls frequently   . GERD (gastroesophageal reflux disease)   . HA (headache)   . Hyperlipidemia   . Left-sided face pain   . Morbid obesity (HCC)   . Noncompliance   . OSA (obstructive sleep apnea)    Uses CPAP  . Type 2 diabetes mellitus (HCC)   . Urinary incontinence     Past Surgical History:  Procedure Laterality Date  . ABDOMINAL HERNIA REPAIR    . ABDOMINAL HYSTERECTOMY    . BACK SURGERY    . BREAST SURGERY     biopsy per right breast; pt states has silver piece in for marking   . Cataract surgery Bilateral   . CHOLECYSTECTOMY    . COLONOSCOPY N/A 04/10/2013   Procedure:  COLONOSCOPY;  Surgeon: Malissa Hippo, MD;  Location: [facility];  Service: Endoscopy;  Laterality: N/A;  . LUMBAR LAMINECTOMY/DECOMPRESSION MICRODISCECTOMY N/A 04/25/2015   Procedure: CENTRAL LUMBAR /DECOMPRESSION L4-L5;  Surgeon: Ranee Gosselin, MD;  Location: [facility];  Service: Orthopedics;  Laterality: N/A;  . TUBAL LIGATION      Current Outpatient Medications  Medication Sig Dispense Refill  . aspirin EC 325 MG tablet Take 325 mg by mouth every morning.     . colestipol (COLESTID) 1 g tablet Take 2 tablets (2 g total) by mouth 2 (two) times daily. 120 tablet 4  . furosemide (LASIX) 40 MG tablet Take 1 tablet (40 mg total) by mouth daily. 90 tablet 1  . gabapentin (NEURONTIN) 300 MG capsule Take 1 capsule (300 mg total) by mouth 3 (three) times daily. 270 capsule 1  . glimepiride (AMARYL) 2 MG tablet Take 1 tablet (2 mg total) by mouth daily before breakfast. 30 tablet 3  . glucose blood (ACCU-CHEK AVIVA PLUS) test strip Use aTest blood sugar once daily 100 each 2  . glucose blood (ONETOUCH VERIO) test strip Test BID and prn  e11.9 100 each 11  . lisinopril (PRINIVIL,ZESTRIL) 10 MG tablet Take 1 tablet (10 mg total) by mouth daily. 90 tablet 3  . metFORMIN (GLUCOPHAGE) 500 MG tablet Take 2 tablets (1,000 mg total) by mouth 2 (two)  times daily with a meal. 360 tablet 1  . metoprolol tartrate (LOPRESSOR) 25 MG tablet Take 1 tablet (25 mg total) by mouth 2 (two) times daily. 180 tablet 3  . nitroGLYCERIN (NITROSTAT) 0.4 MG SL tablet See AUX Label 12 tablet 0  . omeprazole (PRILOSEC) 40 MG capsule TAKE 1 CAPSULE BY MOUTH ONCE DAILY 30 MINUTES BEFORE A MEAL 90 capsule 3  . ONETOUCH DELICA LANCETS 33G MISC Check blood sugar BID and prn  E11.9 100 each 11  . rosuvastatin (CRESTOR) 20 MG tablet Take 1 tablet (20 mg total) by mouth at bedtime. 90 tablet 3  . sitaGLIPtin (JANUVIA) 100 MG tablet Take 1 tablet (100 mg total) by mouth daily. 30 tablet 5   No current facility-administered medications  for this visit.    Allergies:  Morphine and related   Social History: The patient  reports that she has been smoking cigarettes.  She has a 51.00 pack-year smoking history. She has never used smokeless tobacco. She reports that she does not drink alcohol or use drugs.   ROS:  Please see the history of present illness. Otherwise, complete review of systems is positive for chronic leg edema/lymphedema.  All other systems are reviewed and negative.   Physical Exam: VS:  BP 138/78   Pulse 85   Ht  (1.575 m)   Wt (!) 302 lb (137 kg)   SpO2 98%   BMI 55.24 kg/m , BMI Body mass index is 55.24 kg/m.  Wt Readings from Last 3 Encounters:  03/15/18 (!) 302 lb (137 kg)  02/20/18 (!) 304 lb (137.9 kg)  12/19/17 (!) 317 lb 9.6 oz (144.1 kg)    General: Morbidly obese woman, appears comfortable at rest. HEENT: Conjunctiva and lids normal, oropharynx clear. Neck: Supple, no elevated JVP or carotid bruits, no thyromegaly. Lungs: Decreased breath sounds, nonlabored breathing at rest. Cardiac: Regular rate and rhythm, no S3 or significant systolic murmur. Abdomen: Soft, nontender, bowel sounds present. Extremities: Chronic appearing bilateral leg edema and lymphedema, distal pulses 1-2+. Skin: Warm and dry. Musculoskeletal: No kyphosis. Neuropsychiatric: Alert and oriented x3, affect grossly appropriate.  ECG: I personally reviewed the tracing from 04/01/2017 which showed sinus rhythm with low voltage, R' in lead V1 and V2, leftward axis.  Recent Labwork: 02/20/2018: BUN 9; Creatinine, Ser 1.21; Potassium 4.0; Sodium 139     Component Value Date/Time   CHOL 120 11/18/2017 1019   TRIG 219 (H) 11/18/2017 1019   TRIG 211 (H) 12/30/2014 1537   HDL 38 (L) 11/18/2017 1019   HDL 48 12/30/2014 1537   CHOLHDL 3.2 11/18/2017 1019   CHOLHDL 4.5 04/09/2013 1135   VLDL 37 04/09/2013 1135   LDLCALC 38 11/18/2017 1019   LDLCALC 42 08/28/2014 1037    Other Studies Reviewed Today:  Lexiscan  Cardiolite 5/44/2016:  There was no ST segment deviation noted during stress.  Defect 1: There is a medium defect of moderate severity present in the mid anterior and apical anterior location.  This is a low risk study.  Fixed anterior defect as outlined, most consistent with breast attenuation. LVEF 72%.   Assessment and Plan:  1.  CAD with history of BMS to the circumflex in 2007.  She is reporting increasing chest pain, possibly anginal although without definite precipitant.  ECG reviewed.  Last ischemic assessment was in 2016.  We will obtain a 2-day protocol Lexiscan Myoview on medical therapy to reevaluate ischemic burden.  2.  Mixed hyperlipidemia, continues on Crestor with most  recent LDL 38.  3.  CKD stage 3, recent creatinine 1.21.  4.  Chronic lymphedema.  She remains on Lasix.  Current medicines were reviewed with the patient today.   Orders Placed This Encounter  Procedures  . NM Myocar Multi W/Spect W/Wall Motion / EF  . EKG 12-Lead    Disposition: Call with test results.  Signed, Jonelle Sidle, MD, [facility] 03/15/2018 9:52 AM    [facility] at [facility] [address] [place], [place], [place] [address] Phone: [phone]; Fax: [phone]

## 2018-03-15 ENCOUNTER — Telehealth: Payer: Self-pay | Admitting: Cardiology

## 2018-03-15 ENCOUNTER — Ambulatory Visit (INDEPENDENT_AMBULATORY_CARE_PROVIDER_SITE_OTHER): Payer: PPO | Admitting: Cardiology

## 2018-03-15 ENCOUNTER — Encounter: Payer: Self-pay | Admitting: Cardiology

## 2018-03-15 VITALS — BP 138/78 | HR 85 | Ht 62.0 in | Wt 302.0 lb

## 2018-03-15 DIAGNOSIS — I890 Lymphedema, not elsewhere classified: Secondary | ICD-10-CM | POA: Diagnosis not present

## 2018-03-15 DIAGNOSIS — I25119 Atherosclerotic heart disease of native coronary artery with unspecified angina pectoris: Secondary | ICD-10-CM

## 2018-03-15 DIAGNOSIS — N183 Chronic kidney disease, stage 3 unspecified: Secondary | ICD-10-CM

## 2018-03-15 DIAGNOSIS — E782 Mixed hyperlipidemia: Secondary | ICD-10-CM

## 2018-03-15 NOTE — Patient Instructions (Signed)
Medication Instructions:  Your physician recommends that you continue on your current medications as directed. Please refer to the Current Medication list given to you today.  Labwork: NONE  Testing/Procedures: Your physician has requested that you have a lexiscan myoview. For further information please visit [email or web address]. Please follow instruction sheet, as given.  Follow-Up: Your physician wants you to follow-up in: 6 MONTHS WITH DR. MCDOWELL. You will receive a reminder letter in the mail two months in advance. If you don't receive a letter, please call our office to schedule the follow-up appointment.  Any Other Special Instructions Will Be Listed Below (If Applicable).  If you need a refill on your cardiac medications before your next appointment, please call your pharmacy. 

## 2018-03-15 NOTE — Telephone Encounter (Signed)
Pre-cert Verification for the following procedure   Lexiscan Myoview  (2 day protocol) scheduled for 5/20 & 5/21 at [facility]

## 2018-03-17 ENCOUNTER — Other Ambulatory Visit: Payer: Self-pay | Admitting: Pharmacy Technician

## 2018-03-17 NOTE — Patient Outreach (Signed)
[facility]) Care Management  03/17/2018  [name] Aug 21, 1961 [id number]   Contacted [employer] patient assistance to check status of patients application. Spoke to [facility] who stated attestation letter was mailed to patient on 05/03.  Successful outreach call to patient, HIPAA identifiers verified.  Informed patient that attestation letter was mailed out to her on 05/3, She stated she has not received it yet. Went over what is required to be filled out on form. Informed patient that I would follow up with her next week in case she has questions upon receiving it or forgets to contact me to let me know she has mailed it back in.  Will call patient 05/15  [name]. [name] [facility] Care Management [phone]

## 2018-03-24 ENCOUNTER — Ambulatory Visit: Payer: PPO | Admitting: Pediatrics

## 2018-03-27 ENCOUNTER — Encounter (HOSPITAL_COMMUNITY): Payer: PPO

## 2018-03-28 ENCOUNTER — Ambulatory Visit (HOSPITAL_COMMUNITY): Payer: PPO

## 2018-04-04 ENCOUNTER — Encounter (HOSPITAL_COMMUNITY): Payer: Self-pay

## 2018-04-04 ENCOUNTER — Encounter (HOSPITAL_COMMUNITY)
Admission: RE | Admit: 2018-04-04 | Discharge: 2018-04-04 | Disposition: A | Discharge status: Home / Self Care | Payer: PPO | Source: Ambulatory Visit | Attending: Cardiology | Admitting: Cardiology

## 2018-04-04 ENCOUNTER — Ambulatory Visit (HOSPITAL_COMMUNITY)
Admission: RE | Admit: 2018-04-04 | Discharge: 2018-04-04 | Disposition: A | Discharge status: Home / Self Care | Payer: PPO | Source: Ambulatory Visit | Attending: Cardiology | Admitting: Cardiology

## 2018-04-04 DIAGNOSIS — I25119 Atherosclerotic heart disease of native coronary artery with unspecified angina pectoris: Secondary | ICD-10-CM | POA: Diagnosis not present

## 2018-04-04 MED ORDER — REGADENOSON 0.4 MG/5ML IV SOLN
INTRAVENOUS | Status: AC
  Administered 2018-04-04: 0.4 mg via INTRAVENOUS
  Filled 2018-04-04: qty 5

## 2018-04-04 MED ORDER — TECHNETIUM TC 99M TETROFOSMIN IV KIT
30.0000 | PACK | Freq: Once | INTRAVENOUS | Status: AC | PRN
  Administered 2018-04-04: 32 via INTRAVENOUS

## 2018-04-04 MED ORDER — SODIUM CHLORIDE 0.9% FLUSH
INTRAVENOUS | Status: AC
  Administered 2018-04-04: 10 mL via INTRAVENOUS
  Filled 2018-04-04: qty 10

## 2018-04-05 ENCOUNTER — Encounter (HOSPITAL_COMMUNITY)
Admission: RE | Admit: 2018-04-05 | Discharge: 2018-04-05 | Disposition: A | Discharge status: Home / Self Care | Payer: PPO | Source: Ambulatory Visit | Attending: Cardiology | Admitting: Cardiology

## 2018-04-05 ENCOUNTER — Encounter (HOSPITAL_COMMUNITY): Payer: Self-pay

## 2018-04-05 LAB — NM MYOCAR MULTI W/SPECT W/WALL MOTION / EF
LV dias vol: 83 mL (ref 46–106)
LV sys vol: 27 mL
Peak HR: 95 {beats}/min
RATE: 0.65
Rest HR: 63 {beats}/min
SDS: 3
SRS: 5
SSS: 8
TID: 0.82

## 2018-04-05 MED ORDER — TECHNETIUM TC 99M TETROFOSMIN IV KIT
25.0000 | PACK | Freq: Once | INTRAVENOUS | Status: AC | PRN
  Administered 2018-04-05: 25 via INTRAVENOUS

## 2018-04-06 ENCOUNTER — Telehealth: Payer: Self-pay

## 2018-04-06 NOTE — Telephone Encounter (Signed)
-----   Message from Jonelle Sidle, MD sent at 04/05/2018  4:17 PM EDT ----- Results reviewed.  Myoview was low risk showing no clear evidence of ischemia.  If she continues to report intermittent chest discomfort, we could try increasing her Lopressor to 1-1/2 tablets (25 mg) twice daily.  Keep scheduled follow-up. A copy of this test should be forwarded to Johna Sheriff, MD.

## 2018-04-06 NOTE — Telephone Encounter (Signed)
Patient notified. Routed to PCP. Patient states she did not want to make any medication changes at this time but was still having the intermittent chest discomfort.

## 2018-04-07 ENCOUNTER — Other Ambulatory Visit: Payer: Self-pay

## 2018-04-07 NOTE — Patient Outreach (Signed)
[facility]) Care Management  [facility] Care Manager  04/07/2018   [name] 1961-02-01 [id number]  Subjective: Telephone call to the patient for monthly assessment. HIPAA verified.  The patient states that she is doing fair.  She states that she has chronic pain in her legs that is rated at a 7/10 on the pain scale.  She states that the she feels weakness in her legs.  I advised the patient to discuss her condition with her physician at her upcoming appointment June 5th.  Patient verbalized understanding She denies any falls.  She states that her blood sugars range between 156-254.  She states that she is monitoring her diet.  She is adherent with her medications.  She tries to do some movement in the home to exercise.  Encounter Medications:  Outpatient Encounter Medications as of 04/07/2018  Medication Sig  . aspirin EC 325 MG tablet Take 325 mg by mouth every morning.   . colestipol (COLESTID) 1 g tablet Take 2 tablets (2 g total) by mouth 2 (two) times daily.  . furosemide (LASIX) 40 MG tablet Take 1 tablet (40 mg total) by mouth daily.  Marland Kitchen gabapentin (NEURONTIN) 300 MG capsule Take 1 capsule (300 mg total) by mouth 3 (three) times daily.  Marland Kitchen glimepiride (AMARYL) 2 MG tablet Take 1 tablet (2 mg total) by mouth daily before breakfast.  . glucose blood (ACCU-CHEK AVIVA PLUS) test strip Use aTest blood sugar once daily  . glucose blood (ONETOUCH VERIO) test strip Test BID and prn  e11.9  . lisinopril (PRINIVIL,ZESTRIL) 10 MG tablet Take 1 tablet (10 mg total) by mouth daily.  . metFORMIN (GLUCOPHAGE) 500 MG tablet Take 2 tablets (1,000 mg total) by mouth 2 (two) times daily with a meal.  . metoprolol tartrate (LOPRESSOR) 25 MG tablet Take 1 tablet (25 mg total) by mouth 2 (two) times daily.  . nitroGLYCERIN (NITROSTAT) 0.4 MG SL tablet See AUX Label  . omeprazole (PRILOSEC) 40 MG capsule TAKE 1 CAPSULE BY MOUTH ONCE DAILY 30 MINUTES BEFORE A MEAL  . ONETOUCH DELICA LANCETS 33G  MISC Check blood sugar BID and prn  E11.9  . rosuvastatin (CRESTOR) 20 MG tablet Take 1 tablet (20 mg total) by mouth at bedtime.  . sitaGLIPtin (JANUVIA) 100 MG tablet Take 1 tablet (100 mg total) by mouth daily.   No facility-administered encounter medications on file as of 04/07/2018.     Functional Status:  In your present state of health, do you have any difficulty performing the following activities: 11/23/2017  Hearing? N  Vision? N  Difficulty concentrating or making decisions? N  Walking or climbing stairs? Y  Comment uses cane for help with balance due to leg and back pain  Dressing or bathing? N  Doing errands, shopping? Y  Comment Husband or grandson goes with patient to appointments or grocery store  Preparing Food and eating ? N  Using the Toilet? N  In the past six months, have you accidently leaked urine? Y  Comment wears adult pull ups  Do you have problems with loss of bowel control? Y  Comment Feels urgency with bowel movements after eating, has had accidents  Managing your Medications? N  Managing your Finances? N  Housekeeping or managing your Housekeeping? N  Some recent data might be hidden    Fall/Depression Screening: Fall Risk  04/07/2018 03/07/2018 03/07/2018  Falls in the past year? No No No  Comment - - -  Number falls in past yr: - - -  Injury with Fall? - - -  Risk Factor Category  - - -  Risk for fall due to : - - -  Follow up - - -   PHQ 2/9 Scores 02/20/2018 12/19/2017 12/08/2017 11/29/2017 11/23/2017 11/18/2017 04/18/2017  PHQ - 2 Score 2 0 0 2  PHQ- 9 Score 12 - 11 13 - - 8  Exception Documentation - - - - - - Other- indicate reason in comment box  Not completed - - - - - - alot going on in family; doesn't have energy    Assessment: Patient will continue to benefit from health coach outreach for disease management and support.  [facility] CM Care Plan Problem One     Most Recent Value  [facility] Long Term Goal   In 90 days the patient will lower her  a1c from 8.3 1-2 points  [facility] Long Term Goal Start Date  04/07/18  Interventions for Problem One Long Term Goal  discussed diet and medication adherence  [facility] CM Short Term Goal #1   In 30 days the patient will verbalize discussing all of her health problems with her physician.  [facility] CM Short Term Goal #1 Start Date  04/07/18  Interventions for Short Term Goal #1  reinterated talking with her physician about the increasing weakness in her legs     Plan: RN Health Coach will contact patient in the month of June and patient agrees to next outreach. [name] RN, BSN, [facility] RN Health Coach Disease Management [facility] Dial:  [phone]  Fax: [phone]

## 2018-04-11 ENCOUNTER — Ambulatory Visit: Payer: PPO | Admitting: Pediatrics

## 2018-04-12 ENCOUNTER — Ambulatory Visit (INDEPENDENT_AMBULATORY_CARE_PROVIDER_SITE_OTHER): Payer: PPO | Admitting: Pediatrics

## 2018-04-12 ENCOUNTER — Encounter: Payer: Self-pay | Admitting: Pediatrics

## 2018-04-12 VITALS — BP 132/68 | HR 94 | Temp 97.8°F | Ht 62.0 in | Wt 302.8 lb

## 2018-04-12 DIAGNOSIS — E1142 Type 2 diabetes mellitus with diabetic polyneuropathy: Secondary | ICD-10-CM

## 2018-04-12 DIAGNOSIS — E785 Hyperlipidemia, unspecified: Secondary | ICD-10-CM

## 2018-04-12 DIAGNOSIS — R197 Diarrhea, unspecified: Secondary | ICD-10-CM | POA: Diagnosis not present

## 2018-04-12 DIAGNOSIS — Z1211 Encounter for screening for malignant neoplasm of colon: Secondary | ICD-10-CM | POA: Diagnosis not present

## 2018-04-12 NOTE — Progress Notes (Signed)
  Subjective:   Patient ID: [name], female    DOB: 06/28/1961, 57 y.o.   MRN: [id number] CC: Medical Management of Chronic Issues  HPI: [name] is a 57 y.o. female   DM2: AM BGLs upper 100s.  She has been taking her metformin and glimepiride, not able to afford Januvia, did not hear back from assistance program [facility].  Intermittent chest pain: Recent stress test with cardiology was low risk.  She has been taking her metoprolol regularly.  Lots of ongoing stress with family.   Hypertension: Taking medicines regularly.  Continues to have diarrhea after eating.  Does not matter if she is taking metformin or not.  Does not matter what kind of food that she eats when this happens.  Has tried colestipol with minimal improvement in symptoms.  Relevant past medical, surgical, family and social history reviewed. Allergies and medications reviewed and updated. Social History   Tobacco Use  Smoking Status Current Every Day Smoker  . Packs/day: 1.50  . Years: 34.00  . Pack years: 51.00  . Types: Cigarettes  Smokeless Tobacco Never Used   ROS: Per HPI   Objective:    BP 132/68   Pulse 94   Temp 97.8 F (36.6 C) (Oral)   Ht 5\' 2"  (1.575 m)   Wt (!) 302 lb 12.8 oz (137.3 kg)   BMI 55.38 kg/m   Wt Readings from Last 3 Encounters:  04/12/18 (!) 302 lb 12.8 oz (137.3 kg)  03/15/18 (!) 302 lb (137 kg)  02/20/18 (!) 304 lb (137.9 kg)    Gen: NAD, alert, cooperative with exam, NCAT EYES: EOMI, no conjunctival injection, or no icterus ENT:  OP without erythema LYMPH: no cervical LAD CV: NRRR, normal S1/S2, no murmur, distal pulses 2+ b/l Resp: CTABL, no wheezes, normal WOB Abd: +BS, soft, NTND. no guarding or organomegaly Ext: None pitting edema present bilateral feet, warm Neuro: Alert and oriented  Assessment & Plan:  [name] was seen today for medical management of chronic issues.  Diagnoses and all orders for this visit:  Type 2 diabetes mellitus with diabetic  polyneuropathy, without long-term current use of insulin (HCC) Trial of Jardiance.  Samples given.  Gave patient information to contact for pharmacy assistance.   Will be due for blood work next visit.  Screen for colon cancer -     Ambulatory referral to Gastroenterology  Diarrhea, unspecified type Avoid greasy foods.  No improvement with colestipol or with avoiding metformin.  Recommend she sees a GI doctor, referral in.  Follow up plan: Return in about 2 months (around 06/12/2018). [name] Eriyana Sweeten, MD [facility]

## 2018-04-12 NOTE — Patient Instructions (Addendum)
Stacey SlickAshley N. Ernesta [name], CPhT [facility] Care Management [phone]  You are due for eye exam, call eye doctor to set up

## 2018-04-14 MED ORDER — EMPAGLIFLOZIN 10 MG PO TABS: 10.0000 mg | ORAL_TABLET | Freq: Every day | ORAL | 0 refills | Status: DC

## 2018-04-17 ENCOUNTER — Other Ambulatory Visit: Payer: Self-pay | Admitting: Pharmacy Technician

## 2018-04-17 NOTE — Patient Outreach (Signed)
[facility]) Care Management  04/17/2018  Dorothy PufferBetty J Holroyd 01/09/1961 [id number]   Contacted [employer] patient assistance to follow up on patients attestation letter. Spoke to QUALCOMMrma who stated that the attestation letter has not been received.  Successful outreach call to patient, HIPAA verified identifiers verified. Patient states that she has been on the lookout for the Attestation letter from [employer] patient assistance and has not received.  Will follow up with [employer] to address any issues.  Suzan SlickAshley N. Ernesta Ambleoleman, CPhT [facility] Care Management [phone]

## 2018-04-21 ENCOUNTER — Other Ambulatory Visit: Payer: Self-pay | Admitting: Pharmacy Technician

## 2018-04-21 NOTE — Patient Outreach (Signed)
[facility]) Care Management  04/21/2018  Dorothy PufferBetty J Stitzer 02/09/1961 [id number]   Contacted [employer] patient assistance in reference to patient not receiving attestation letter that was mailed out to her on 05/03. Spoke to [place] who consulted with her supervisor Dustin FlockSteve G and he gave special permission to mail out attestation letter to patient and have her fax back in to him at [phone].  Successful outreach call to [name], HIPAA identifiers verified. Informed her that the company is going to re-mail her the attestation letter and confirmed that she has access to get it faxed in. She stated that she would either have the pharmacy do it or go to [employer] library. Provided [name] with the [employer] fax # as well as who to attention it too. I also requested that she inform when she has faxed it back in so that I can follow up with [employer].  Will follow up with patient in 10- 14 business days if I have not received a call from her.  Suzan SlickAshley N. Ernesta Ambleoleman, CPhT [facility] Care Management [phone]

## 2018-05-04 ENCOUNTER — Other Ambulatory Visit: Payer: Self-pay

## 2018-05-04 NOTE — Patient Outreach (Signed)
[facility] ([facility]) Care Management  [facility] Care Manager  05/04/2018   [name] 07/04/1961 [id number]  Subjective: Telephone call to the patient for assessment. HIPAA verified. The patient states that she is in some pain that she rates at a 9/10.  She states that her lower back is hurting.  She had surgery on it in June of 2016 but it is not better.  She denies any falls.  Her blood sugar this morning was 118.  She states that she tries to eat right but still has a problem with diarrhea.  He physician is scheduling her to have a colonoscopy.  She has a follow up appointment with her physician next month.    Encounter Medications:  Outpatient Encounter Medications as of 05/04/2018  Medication Sig  . aspirin EC 325 MG tablet Take 325 mg by mouth every morning.   . colestipol (COLESTID) 1 g tablet Take 2 tablets (2 g total) by mouth 2 (two) times daily.  . empagliflozin (JARDIANCE) 10 MG TABS tablet Take 10 mg by mouth daily.  . furosemide (LASIX) 40 MG tablet Take 1 tablet (40 mg total) by mouth daily.  . gabapentin (NEURONTIN) 300 MG capsule Take 1 capsule (300 mg total) by mouth 3 (three) times daily.  . glimepiride (AMARYL) 2 MG tablet Take 1 tablet (2 mg total) by mouth daily before breakfast.  . glucose blood (ACCU-CHEK AVIVA PLUS) test strip Use aTest blood sugar once daily  . glucose blood (ONETOUCH VERIO) test strip Test BID and prn  e11.9  . lisinopril (PRINIVIL,ZESTRIL) 10 MG tablet Take 1 tablet (10 mg total) by mouth daily.  . metFORMIN (GLUCOPHAGE) 500 MG tablet Take 2 tablets (1,000 mg total) by mouth 2 (two) times daily with a meal.  . metoprolol tartrate (LOPRESSOR) 25 MG tablet Take 1 tablet (25 mg total) by mouth 2 (two) times daily.  . nitroGLYCERIN (NITROSTAT) 0.4 MG SL tablet See AUX Label  . omeprazole (PRILOSEC) 40 MG capsule TAKE 1 CAPSULE BY MOUTH ONCE DAILY 30 MINUTES BEFORE A MEAL  . ONETOUCH DELICA LANCETS 33G MISC Check blood sugar BID and prn  E11.9   . rosuvastatin (CRESTOR) 20 MG tablet Take 1 tablet (20 mg total) by mouth at bedtime.  . sitaGLIPtin (JANUVIA) 100 MG tablet Take 1 tablet (100 mg total) by mouth daily.   No facility-administered encounter medications on file as of 05/04/2018.     Functional Status:  In your present state of health, do you have any difficulty performing the following activities: 11/23/2017  Hearing? N  Vision? N  Difficulty concentrating or making decisions? N  Walking or climbing stairs? Y  Comment uses cane for help with balance due to leg and back pain  Dressing or bathing? N  Doing errands, shopping? Y  Comment Husband or grandson goes with patient to appointments or grocery store  Preparing Food and eating ? N  Using the Toilet? N  In the past six months, have you accidently leaked urine? Y  Comment wears adult pull ups  Do you have problems with loss of bowel control? Y  Comment Feels urgency with bowel movements after eating, has had accidents  Managing your Medications? N  Managing your Finances? N  Housekeeping or managing your Housekeeping? N  Some recent data might be hidden    Fall/Depression Screening: Fall Risk  05/04/2018 04/12/2018 04/07/2018  Falls in the past year? No No No  Comment - - -  Number falls in past yr: - - -    Injury with Fall? - - -  Risk Factor Category  - - -  Risk for fall due to : - - -  Follow up - - -   PHQ 2/9 Scores 04/12/2018 02/20/2018 12/19/2017 12/08/2017 11/29/2017 11/23/2017 11/18/2017  PHQ - 2 Score 0 2 0 2 3 1 0  PHQ- 9 Score - 12 - 11 13 - -  Exception Documentation - - - - - - -  Not completed - - - - - - -    Assessment: Patient will continue to benefit from health coach outreach for disease management and support.  [facility] CM Care Plan Problem One     Most Recent Value  [facility] Long Term Goal   In 90 days the patient will lower her a1c from 8.3 1-2 points  [facility] Long Term Goal Start Date  05/04/18  Interventions for Problem One Long Term Goal   reinterated medication adherenc and diet  [facility] CM Short Term Goal #1   In 30 days the patient will verbalize discussing all of her health problems with her physician.  [facility] CM Short Term Goal #1 Start Date  05/04/18  [facility] CM Short Term Goal #1 Met Date  05/04/18     Plan: RN Health Coach will contact patient in the month of July and patient agrees to next outreach.    RN, BSN, [facility] RN Health Coach Disease Management [facility] Direct Dial:  [phone]  Fax: [phone]     

## 2018-05-19 ENCOUNTER — Encounter: Payer: Self-pay | Admitting: Pediatrics

## 2018-05-19 ENCOUNTER — Ambulatory Visit (INDEPENDENT_AMBULATORY_CARE_PROVIDER_SITE_OTHER): Payer: PPO | Admitting: Pediatrics

## 2018-05-19 VITALS — BP 130/73 | HR 83 | Temp 98.0°F | Ht 62.0 in | Wt 299.4 lb

## 2018-05-19 DIAGNOSIS — N949 Unspecified condition associated with female genital organs and menstrual cycle: Secondary | ICD-10-CM | POA: Diagnosis not present

## 2018-05-19 DIAGNOSIS — N898 Other specified noninflammatory disorders of vagina: Secondary | ICD-10-CM | POA: Diagnosis not present

## 2018-05-19 DIAGNOSIS — N3090 Cystitis, unspecified without hematuria: Secondary | ICD-10-CM | POA: Diagnosis not present

## 2018-05-19 DIAGNOSIS — E1142 Type 2 diabetes mellitus with diabetic polyneuropathy: Secondary | ICD-10-CM | POA: Diagnosis not present

## 2018-05-19 DIAGNOSIS — B379 Candidiasis, unspecified: Secondary | ICD-10-CM

## 2018-05-19 LAB — WET PREP FOR TRICH, YEAST, CLUE
Clue Cell Exam: NEGATIVE
Trichomonas Exam: NEGATIVE
Yeast Exam: POSITIVE — AB

## 2018-05-19 LAB — URINALYSIS, COMPLETE
Bilirubin, UA: NEGATIVE
Ketones, UA: NEGATIVE
Nitrite, UA: POSITIVE — AB
Specific Gravity, UA: 1.01 (ref 1.005–1.030)
Urobilinogen, Ur: 0.2 mg/dL (ref 0.2–1.0)
pH, UA: 6 (ref 5.0–7.5)

## 2018-05-19 LAB — MICROSCOPIC EXAMINATION
Epithelial Cells (non renal): >10 /hpf — AB (ref 0–10)
WBC, UA: >30 /hpf — AB (ref 0–5)

## 2018-05-19 MED ORDER — FLUCONAZOLE 150 MG PO TABS: 150.0000 mg | ORAL_TABLET | ORAL | 0 refills | Status: DC | PRN

## 2018-05-19 MED ORDER — NITROFURANTOIN MONOHYD MACRO 100 MG PO CAPS: 100.0000 mg | ORAL_CAPSULE | Freq: Two times a day (BID) | ORAL | 0 refills | Status: AC

## 2018-05-19 MED ORDER — GABAPENTIN 300 MG PO CAPS: ORAL_CAPSULE | ORAL | 1 refills | Status: DC

## 2018-05-19 MED ORDER — EMPAGLIFLOZIN 10 MG PO TABS: 10.0000 mg | ORAL_TABLET | Freq: Every day | ORAL | 2 refills | Status: DC

## 2018-05-19 NOTE — Patient Instructions (Signed)
Call office to ask if due for colonoscopy Dr. Karilyn Cotaehman Address: [address], [place], [place] [address] Phone: [phone]

## 2018-05-19 NOTE — Progress Notes (Signed)
Subjective:   Patient ID: Stacey PufferBetty J Benak, female    DOB: 02/04/1961, 57 y.o.   MRN: [id number] CC: Follow-up multiple medical problems. HPI: [name] is a 57 y.o. female   DM2: Blood sugars in the morning have been 120s up to 150s.  Random sugar in the middle of the day was 108.  Highest morning blood sugar has been up to 190 since starting on the medicine, mostly has been lower.  She has been taking Jardiance 10 mg regularly.  Neuropathy: Improved since increasing gabapentin.  Taking 2 tabs in the morning, 1 at noon, 2 at night.  She does not have any feeling in her feet.  Dysuria: Ongoing for last 2 weeks.  Some lower abdominal pain.  Appetite is been okay.  No fevers.  She has had some itching in the vaginal area.  No discharge.  Relevant past medical, surgical, family and social history reviewed. Allergies and medications reviewed and updated. Social History   Tobacco Use  Smoking Status Current Every Day Smoker  . Packs/day: 1.50  . Years: 34.00  . Pack years: 51.00  . Types: Cigarettes  Smokeless Tobacco Never Used   ROS: Per HPI   Objective:    BP 130/73   Pulse 83   Temp 98 F (36.7 C) (Oral)   Ht 5\' 2"  (1.575 m)   Wt 299 lb 6.4 oz (135.8 kg)   BMI 54.76 kg/m   Wt Readings from Last 3 Encounters:  05/19/18 299 lb 6.4 oz (135.8 kg)  04/12/18 (!) 302 lb 12.8 oz (137.3 kg)  03/15/18 (!) 302 lb (137 kg)   Gen: NAD, alert, cooperative with exam, NCAT EYES: EOMI, no conjunctival injection, or no icterus CV: NRRR, normal S1/S2, no murmur Resp: CTABL, no wheezes, normal WOB Abd: +BS, soft, NTND.  Ext: No pitting edema, warm Neuro: Alert and oriented, decreased sensation bilateral feet and lower legs to touch and monofilament.  Assessment & Plan:  [name] was seen today for a multiple medical problem follow-up.  Diagnoses and all orders for this visit:  Type 2 diabetes mellitus with diabetic polyneuropathy, without long-term current use of insulin (HCC) Continue  gabapentin for neuropathy.  Continue Jardiance and other diabetes medicines.  Continue to check sugars regularly.  Let me know if regularly greater than 200.  Will repeat A1c next visit, not quite 3 months since last check today.  Encourage patient to daily look at the bottoms of her feet or have someone else look at them if she is not able to see them. -     gabapentin (NEURONTIN) 300 MG capsule; Take 2 tabs in the morning, 1 tab at noon, 2 tabs in the evening -     empagliflozin (JARDIANCE) 10 MG TABS tablet; Take 10 mg by mouth daily.  Vaginal burning Wet prep positive for yeast.  Will treat with fluconazole. -     WET PREP FOR TRICH, YEAST, CLUE -     Urinalysis, Complete -     Urine Culture; Future -     Urine Culture  Vaginal itching -     WET PREP FOR TRICH, YEAST, CLUE -     Urinalysis, Complete -     Urine Culture; Future -     Urine Culture  Cystitis Positive UA, will treat with below.  Follow-up urine culture. -     nitrofurantoin, macrocrystal-monohydrate, (MACROBID) 100 MG capsule; Take 1 capsule (100 mg total) by mouth 2 (two) times daily for 5 days.  Yeast infection -     fluconazole (DIFLUCAN) 150 MG tablet; Take 1 tablet (150 mg total) by mouth every three (3) days as needed.  Other orders -     Microscopic Examination  Colonoscopy: Patient is not sure she is due for one or not.  Not able to see colonoscopy report in electronic medical record.  Will have patient sign for records, also have her call her gastroenterologist office to ask if she is due.  Follow up plan: Return in about 2 months (around 07/20/2018). Rex Kras, MD [place]

## 2018-05-21 LAB — URINE CULTURE

## 2018-06-06 ENCOUNTER — Other Ambulatory Visit: Payer: Self-pay

## 2018-06-06 NOTE — Patient Outreach (Signed)
[facility]) Care Management  06/06/2018   [name] 08/10/1961 [id number]  Subjective: Telephone call to the patient for assessment. HIPAA verified. The patient states that she is doing well.  She had an appointment with Dr Oswaldo Done on 7/12. She has a UTI and Yeast infection and is taking Macrobid 100 mg and Diflucan 150 mg. The patient checked her blood sugar this morning it was 118. She states that she is monitoring her diet.   She denies any pain or falls. She states that she still has pain in her legs but not this morning. She is adherent with her medications. She has an appointment with Dr. Oswaldo Done on September 12 th.    Current Medications:  Current Outpatient Medications  Medication Sig Dispense Refill  . aspirin EC 325 MG tablet Take 325 mg by mouth every morning.     . colestipol (COLESTID) 1 g tablet Take 2 tablets (2 g total) by mouth 2 (two) times daily. 120 tablet 4  . empagliflozin (JARDIANCE) 10 MG TABS tablet Take 10 mg by mouth daily. 30 tablet 2  . fluconazole (DIFLUCAN) 150 MG tablet Take 1 tablet (150 mg total) by mouth every three (3) days as needed. 3 tablet 0  . furosemide (LASIX) 40 MG tablet Take 1 tablet (40 mg total) by mouth daily. 90 tablet 1  . gabapentin (NEURONTIN) 300 MG capsule Take 2 tabs in the morning, 1 tab at noon, 2 tabs in the evening 450 capsule 1  . glimepiride (AMARYL) 2 MG tablet Take 1 tablet (2 mg total) by mouth daily before breakfast. 30 tablet 3  . glucose blood (ACCU-CHEK AVIVA PLUS) test strip Use aTest blood sugar once daily 100 each 2  . glucose blood (ONETOUCH VERIO) test strip Test BID and prn  e11.9 100 each 11  . lisinopril (PRINIVIL,ZESTRIL) 10 MG tablet Take 1 tablet (10 mg total) by mouth daily. 90 tablet 3  . metFORMIN (GLUCOPHAGE) 500 MG tablet Take 2 tablets (1,000 mg total) by mouth 2 (two) times daily with a meal. 360 tablet 1  . metoprolol tartrate (LOPRESSOR) 25 MG tablet Take 1 tablet (25 mg total) by  mouth 2 (two) times daily. 180 tablet 3  . nitroGLYCERIN (NITROSTAT) 0.4 MG SL tablet See AUX Label 12 tablet 0  . omeprazole (PRILOSEC) 40 MG capsule TAKE 1 CAPSULE BY MOUTH ONCE DAILY 30 MINUTES BEFORE A MEAL 90 capsule 3  . ONETOUCH DELICA LANCETS 33G MISC Check blood sugar BID and prn  E11.9 100 each 11  . rosuvastatin (CRESTOR) 20 MG tablet Take 1 tablet (20 mg total) by mouth at bedtime. 90 tablet 3   No current facility-administered medications for this visit.     Functional Status:  In your present state of health, do you have any difficulty performing the following activities: 11/23/2017  Hearing? N  Vision? N  Difficulty concentrating or making decisions? N  Walking or climbing stairs? Y  Comment uses cane for help with balance due to leg and back pain  Dressing or bathing? N  Doing errands, shopping? Y  Comment Husband or grandson goes with patient to appointments or grocery store  Preparing Food and eating ? N  Using the Toilet? N  In the past six months, have you accidently leaked urine? Y  Comment wears adult pull ups  Do you have problems with loss of bowel control? Y  Comment Feels urgency with bowel movements after eating, has had accidents  Managing your Medications?  N  Managing your Finances? N  Housekeeping or managing your Housekeeping? N  Some recent data might be hidden    Fall/Depression Screening: Fall Risk  06/06/2018 05/19/2018 05/04/2018  Falls in the past year? No No No  Comment - - -  Number falls in past yr: - - -  Injury with Fall? - - -  Risk Factor Category  - - -  Risk for fall due to : - - -  Follow up - - -   PHQ 2/9 Scores 05/19/2018 04/12/2018 02/20/2018 12/19/2017 12/08/2017 11/29/2017 11/23/2017  PHQ - 2 Score 0 0 2 0 2 3 1   PHQ- 9 Score - - 12 - 11 13 -  Exception Documentation - - - - - - -  Not completed - - - - - - -    Assessment: Patient will continue to benefit from health coach outreach for disease management and support.  [facility] CM  Care Plan Problem One     Most Recent Value  Care Plan for Problem One  Active  [facility] Long Term Goal   In 90 days the patient will lower her a1c from 8.3 1-2 points  [facility] Long Term Goal Start Date  06/06/18  Interventions for Problem One Long Term Goal  Reviewed cbg record,  discussed diet,  reviewed and discussed medication management      Plan: RN Health Coach will contact patient in the month of August and patient agrees to next outreach.  Juanell Fairlyraci Jacquelynn Friend RN, BSN, [facility] RN Health Coach Disease Management [facility] Direct Dial:  [phone]  Fax: [phone]

## 2018-06-15 ENCOUNTER — Encounter: Payer: Self-pay | Admitting: Pediatrics

## 2018-06-15 ENCOUNTER — Ambulatory Visit (INDEPENDENT_AMBULATORY_CARE_PROVIDER_SITE_OTHER): Payer: PPO | Admitting: Pediatrics

## 2018-06-15 ENCOUNTER — Encounter (INDEPENDENT_AMBULATORY_CARE_PROVIDER_SITE_OTHER): Payer: Self-pay | Admitting: *Deleted

## 2018-06-15 VITALS — BP 131/85 | HR 81 | Temp 97.5°F | Ht 62.0 in | Wt 294.0 lb

## 2018-06-15 DIAGNOSIS — Z800 Family history of malignant neoplasm of digestive organs: Secondary | ICD-10-CM

## 2018-06-15 DIAGNOSIS — I10 Essential (primary) hypertension: Secondary | ICD-10-CM | POA: Diagnosis not present

## 2018-06-15 DIAGNOSIS — E1142 Type 2 diabetes mellitus with diabetic polyneuropathy: Secondary | ICD-10-CM | POA: Diagnosis not present

## 2018-06-15 LAB — BAYER DCA HB A1C WAIVED: HB A1C (BAYER DCA - WAIVED): 6.9 % (ref <7.0)

## 2018-06-15 MED ORDER — GLIMEPIRIDE 2 MG PO TABS: 2.0000 mg | ORAL_TABLET | Freq: Every day | ORAL | 3 refills | Status: DC

## 2018-06-15 NOTE — Progress Notes (Signed)
  Subjective:   Patient ID: [name], female    DOB: 05-13-1961, 57 y.o.   MRN: [id number] CC: Diabetes follow-up HPI: [name] is a 57 y.o. female   Diabetes: Blood sugars have been 150s to 160s when she checks in the morning.  Taking medicine regularly.  Tolerating well.  No side effects.  No low numbers.  Arthritis: Both of her knees have been bothering her off and on fairly regularly over the last few months.  Weightbearing tends to bother the more.  Hypertension: Taking medicine regularly.  No chest pain or shortness of breath.  Last colonoscopy 5 years ago.  Recommended to have repeat in 5 years, she does have a family history of colon cancer in her father, diagnosed in his 17s or 4s.  Relevant past medical, surgical, family and social history reviewed. Allergies and medications reviewed and updated. Social History   Tobacco Use  Smoking Status Current Every Day Smoker  . Packs/day: 1.50  . Years: 34.00  . Pack years: 51.00  . Types: Cigarettes  Smokeless Tobacco Never Used   ROS: Per HPI   Objective:    BP 131/85   Pulse 81   Temp (!) 97.5 F (36.4 C) (Oral)   Ht 5' 2" (1.575 m)   Wt 294 lb (133.4 kg)   BMI 53.77 kg/m   Wt Readings from Last 3 Encounters:  06/15/18 294 lb (133.4 kg)  05/19/18 299 lb 6.4 oz (135.8 kg)  04/12/18 (!) 302 lb 12.8 oz (137.3 kg)    Gen: NAD, alert, cooperative with exam, NCAT EYES: EOMI, no conjunctival injection, or no icterus ENT:   OP without erythema CV: NRRR, normal S1/S2, no murmur, distal pulses 2+ b/l Resp: CTABL, no wheezes, normal WOB Abd: +BS, soft, NTND. no guarding or organomegaly Ext: Non- pitting edema present bilateral lower extremities, warm Neuro: Alert and oriented, strength equal b/l UE and LE, coordination grossly normal MSK: normal muscle bulk  Assessment & Plan:  57 year old female here for follow-up multiple medical problems.  Diagnoses and all orders for this visit:  Type 2 diabetes mellitus  with diabetic polyneuropathy, without long-term current use of insulin (HCC) A1c 6.9, much improved. Cont diet changes, avoiding sugar.  Any low numbers <100 or numbers greater than 250 let me know.   -     Bayer DCA Hb A1c Waived -     Microalbumin / creatinine urine ratio -     glimepiride (AMARYL) 2 MG tablet; Take 1 tablet (2 mg total) by mouth daily before breakfast. -     BMP8+EGFR  Family history of colon cancer -     Ambulatory referral to Gastroenterology  Essential hypertension, benign Stable, continue current medicines.  Was having low blood pressures when on higher dose of blood pressure medicine.  Follow up plan: 3 mo, sooner prn Assunta Found, MD [place]

## 2018-06-15 NOTE — Patient Instructions (Signed)
Schedule Routine Eye Exam an have copy faxed to our office

## 2018-06-16 ENCOUNTER — Other Ambulatory Visit: Payer: Self-pay | Admitting: Pediatrics

## 2018-06-16 DIAGNOSIS — E1142 Type 2 diabetes mellitus with diabetic polyneuropathy: Secondary | ICD-10-CM

## 2018-06-16 LAB — MICROALBUMIN / CREATININE URINE RATIO
Creatinine, Urine: 30 mg/dL
Microalb/Creat Ratio: <10 mg/g creat (ref 0.0–30.0)
Microalbumin, Urine: <3 ug/mL

## 2018-06-16 LAB — BMP8+EGFR
BUN/Creatinine Ratio: 9 (ref 9–23)
BUN: 10 mg/dL (ref 6–24)
CO2: 23 mmol/L (ref 20–29)
Calcium: 9.2 mg/dL (ref 8.7–10.2)
Chloride: 102 mmol/L (ref 96–106)
Creatinine, Ser: 1.11 mg/dL — ABNORMAL HIGH (ref 0.57–1.00)
GFR calc Af Amer: 63 mL/min/{1.73_m2} (ref >59)
GFR calc non Af Amer: 54 mL/min/{1.73_m2} — ABNORMAL LOW (ref >59)
Glucose: 149 mg/dL — ABNORMAL HIGH (ref 65–99)
Potassium: 4.2 mmol/L (ref 3.5–5.2)
Sodium: 141 mmol/L (ref 134–144)

## 2018-07-03 ENCOUNTER — Telehealth: Payer: Self-pay | Admitting: Pediatrics

## 2018-07-03 ENCOUNTER — Other Ambulatory Visit: Payer: Self-pay | Admitting: Family Medicine

## 2018-07-03 DIAGNOSIS — B379 Candidiasis, unspecified: Secondary | ICD-10-CM

## 2018-07-03 MED ORDER — FLUCONAZOLE 150 MG PO TABS: 150.0000 mg | ORAL_TABLET | ORAL | 0 refills | Status: DC | PRN

## 2018-07-03 NOTE — Telephone Encounter (Signed)
I sent in the requested prescription 

## 2018-07-03 NOTE — Telephone Encounter (Signed)
Patient aware.

## 2018-07-03 NOTE — Telephone Encounter (Signed)
Returned patient's phone call.  Patient states that she is having vaginal burning, itching and swelling.  No discharge at this time.  Patient would like a prescription to be sent to pharmacy for yeast.

## 2018-07-07 ENCOUNTER — Other Ambulatory Visit: Payer: Self-pay

## 2018-07-07 NOTE — Patient Outreach (Signed)
[facility]) Care Management  07/07/2018   Dorothy PufferBetty J [name] 10/17/1961 [id number]  Subjective: Telephone call to the patient for assessment.  HIPAA verified.  The patient states that she has been doing ok.  She reports that she has received the information that I sent to her. The patient was seen by her physician on 06/15/18 and her a1c was 6.9. Her blood sugar today was 152.  She states that it has been running between 150-160's.  She states that she has been having pain in her back legs and feet. She rates her pain at 9/10. She has gabapentin that she uses.  Her blood pressure has been running 130/60's.  She states that her diet has been healthy. I encouraged her to continue to monitor her food intake.  She states that her next appointment will be in September.    Current Medications:  Current Outpatient Medications  Medication Sig Dispense Refill  . aspirin EC 325 MG tablet Take 325 mg by mouth every morning.     . colestipol (COLESTID) 1 g tablet Take 2 tablets (2 g total) by mouth 2 (two) times daily. 120 tablet 4  . empagliflozin (JARDIANCE) 10 MG TABS tablet Take 10 mg by mouth daily. 30 tablet 2  . fluconazole (DIFLUCAN) 150 MG tablet Take 1 tablet (150 mg total) by mouth every three (3) days as needed. 3 tablet 0  . furosemide (LASIX) 40 MG tablet Take 1 tablet (40 mg total) by mouth daily. 90 tablet 1  . gabapentin (NEURONTIN) 300 MG capsule Take 2 tabs in the morning, 1 tab at noon, 2 tabs in the evening 450 capsule 1  . glimepiride (AMARYL) 2 MG tablet Take 1 tablet (2 mg total) by mouth daily before breakfast. 30 tablet 3  . lisinopril (PRINIVIL,ZESTRIL) 10 MG tablet Take 1 tablet (10 mg total) by mouth daily. 90 tablet 3  . metFORMIN (GLUCOPHAGE) 500 MG tablet Take 2 tablets (1,000 mg total) by mouth 2 (two) times daily with a meal. 360 tablet 1  . metoprolol tartrate (LOPRESSOR) 25 MG tablet Take 1 tablet (25 mg total) by mouth 2 (two) times daily. 180 tablet 3  .  nitroGLYCERIN (NITROSTAT) 0.4 MG SL tablet See AUX Label 12 tablet 0  . omeprazole (PRILOSEC) 40 MG capsule TAKE 1 CAPSULE BY MOUTH ONCE DAILY 30 MINUTES BEFORE A MEAL 90 capsule 3  . ONETOUCH DELICA LANCETS 33G MISC Check blood sugar BID and prn  E11.9 100 each 11  . rosuvastatin (CRESTOR) 20 MG tablet Take 1 tablet (20 mg total) by mouth at bedtime. 90 tablet 3  . glucose blood (ACCU-CHEK AVIVA PLUS) test strip Use aTest blood sugar once daily 100 each 2  . glucose blood (ONETOUCH VERIO) test strip Test BID and prn  e11.9 100 each 11   No current facility-administered medications for this visit.     Functional Status:  In your present state of health, do you have any difficulty performing the following activities: 11/23/2017  Hearing? N  Vision? N  Difficulty concentrating or making decisions? N  Walking or climbing stairs? Y  Comment uses cane for help with balance due to leg and back pain  Dressing or bathing? N  Doing errands, shopping? Y  Comment Husband or grandson goes with patient to appointments or grocery store  Preparing Food and eating ? N  Using the Toilet? N  In the past six months, have you accidently leaked urine? Y  Comment wears adult pull ups  Do you have problems with loss of bowel control? Y  Comment Feels urgency with bowel movements after eating, has had accidents  Managing your Medications? N  Managing your Finances? N  Housekeeping or managing your Housekeeping? N  Some recent data might be hidden    Fall/Depression Screening: Fall Risk  07/07/2018 06/15/2018 06/06/2018  Falls in the past year? No No No  Comment - - -  Number falls in past yr: - - -  Injury with Fall? - - -  Risk Factor Category  - - -  Risk for fall due to : - - -  Follow up - - -   PHQ 2/9 Scores 06/15/2018 05/19/2018 04/12/2018 02/20/2018 12/19/2017 12/08/2017 11/29/2017  PHQ - 2 Score 5 0 0 2 0 2 3  PHQ- 9 Score 16 - - 12 - 11 13  Exception Documentation - - - - - - -  Not completed - - -  - - - -    Assessment: Patient will continue to benefit from health coach outreach for disease management and support.  [facility] CM Care Plan Problem One     Most Recent Value  [facility] Long Term Goal   in 90 days the patient will lower her a1c from 6.9 1-2 points.  [facility] Long Term Goal Start Date  07/07/18  Interventions for Problem One Long Term Goal     Reviewed cbg record,  discussed dietary intake,  reviewed and discussed medication management       Plan: RN Health Coach will contact patient in the month of October and patient agrees to next outreach.  [name] RN, BSN, [facility] RN Health Coach Disease Management [facility] Dial:  [phone]  Fax: [phone]

## 2018-07-13 ENCOUNTER — Encounter: Payer: Self-pay | Admitting: Pharmacy Technician

## 2018-07-13 ENCOUNTER — Other Ambulatory Visit: Payer: Self-pay | Admitting: Pharmacy Technician

## 2018-07-13 NOTE — Patient Outreach (Signed)
[facility]) Care Management  07/13/2018  [name] 21-Jun-1961 [id number]   Successful outreach call to patient, HIPAA identifiers verified. [name], [name] SHe states that she cannot afford the $45 co-pay for the new drug and has been received samples. Informed her that I will mail out a [employer] patient assistance application to herself and Dr. Oswaldo Done.  Will follow up with patient in 5-7 business days to confirm application has been received.  [name] [name] [facility] Care Management [phone]

## 2018-07-14 ENCOUNTER — Encounter: Payer: Self-pay | Admitting: Pharmacy Technician

## 2018-07-24 ENCOUNTER — Ambulatory Visit (INDEPENDENT_AMBULATORY_CARE_PROVIDER_SITE_OTHER): Payer: PPO | Admitting: Pediatrics

## 2018-07-24 ENCOUNTER — Encounter: Payer: Self-pay | Admitting: Pediatrics

## 2018-07-24 VITALS — BP 114/70 | HR 76 | Temp 97.1°F | Ht 62.0 in | Wt 292.0 lb

## 2018-07-24 DIAGNOSIS — Z1211 Encounter for screening for malignant neoplasm of colon: Secondary | ICD-10-CM | POA: Diagnosis not present

## 2018-07-24 DIAGNOSIS — E1142 Type 2 diabetes mellitus with diabetic polyneuropathy: Secondary | ICD-10-CM | POA: Diagnosis not present

## 2018-07-24 DIAGNOSIS — F39 Unspecified mood [affective] disorder: Secondary | ICD-10-CM

## 2018-07-24 DIAGNOSIS — M25561 Pain in right knee: Secondary | ICD-10-CM | POA: Diagnosis not present

## 2018-07-24 DIAGNOSIS — M25562 Pain in left knee: Secondary | ICD-10-CM | POA: Diagnosis not present

## 2018-07-24 DIAGNOSIS — G8929 Other chronic pain: Secondary | ICD-10-CM | POA: Diagnosis not present

## 2018-07-24 MED ORDER — METFORMIN HCL 500 MG PO TABS: 1000.0000 mg | ORAL_TABLET | Freq: Two times a day (BID) | ORAL | 1 refills | Status: DC

## 2018-07-24 MED ORDER — EMPAGLIFLOZIN 25 MG PO TABS: 25.0000 mg | ORAL_TABLET | Freq: Every day | ORAL | 3 refills | Status: DC

## 2018-07-24 MED ORDER — GABAPENTIN 300 MG PO CAPS: ORAL_CAPSULE | ORAL | 1 refills | Status: DC

## 2018-07-24 NOTE — Patient Instructions (Signed)
Virtual Behavioral Health Contact: [phone]  

## 2018-07-24 NOTE — Progress Notes (Signed)
Subjective:   Patient ID: [name], female    DOB: 07-07-1961, 57 y.o.   MRN: [id number] CC: Medical Management of Chronic Issues and Extremity Weakness (Bilateral, Burning, Aching)  HPI: [name] is a 57 y.o. female   Has pain in lower legs, goes up to her hips and lower back. Feels like burning, sometimes cramping.  Has had some leg pain since back surgery.  She does have bilateral osteoarthritis of her knees.  At first gabapentin was helping, now not as much.  Elevated BMI: Working on weight loss.  She walks up and down a hill daily to help take care of her 78 year old father who lives nearby.  Depression: Positive PHQ 9.  She has thoughts wishing she were no longer here, no plans to hurt herself.  Lots of stress at home.  She is not open to counseling, very hesitant about VBH, her says her husband would listen in.  She says she feels safe at home.  Her husband would never do anything to hurt her but he is very controlling.  DM2: 232, 191 in the mid mornings.  Taking her medicine regularly.  Relevant past medical, surgical, family and social history reviewed. Allergies and medications reviewed and updated. Social History   Tobacco Use  Smoking Status Current Every Day Smoker  . Packs/day: 1.50  . Years: 34.00  . Pack years: 51.00  . Types: Cigarettes  Smokeless Tobacco Never Used   ROS: Per HPI   Objective:    BP 114/70   Pulse 76   Temp (!) 97.1 F (36.2 C) (Oral)   Ht 5\' 2"  (1.575 m)   Wt 292 lb (132.5 kg)   BMI 53.41 kg/m   Wt Readings from Last 3 Encounters:  07/24/18 292 lb (132.5 kg)  06/15/18 294 lb (133.4 kg)  05/19/18 299 lb 6.4 oz (135.8 kg)    Gen: NAD, alert, cooperative with exam, NCAT EYES: EOMI, no conjunctival injection, or no icterus ENT:  OP without erythema LYMPH: no cervical LAD CV: NRRR, normal S1/S2, no murmur, distal pulses 2+ b/l Resp: CTABL, no wheezes, normal WOB Abd: +BS, soft, NTND. no guarding or organomegaly Ext: No edema,  warm Neuro: Alert and oriented MSK: normal muscle bulk Psych: Mood down, tearful at times.  Assessment & Plan:  [name] was seen today for medical management of chronic issues and extremity weakness.  Diagnoses and all orders for this visit:  Type 2 diabetes mellitus with diabetic polyneuropathy, without long-term current use of insulin (HCC) A1c 1 month ago 6.9.  Continue increased dose of Jardiance.  May need to switch to Invokana if not covered by insurance.  Will increase gabapentin to 2 tabs 3 times a day. -     metFORMIN (GLUCOPHAGE) 500 MG tablet; Take 2 tablets (1,000 mg total) by mouth 2 (two) times daily with a meal. -     gabapentin (NEURONTIN) 300 MG capsule; Take 2 tabs in the morning, 2 tab at noon, 2 tabs in the evening -     empagliflozin (JARDIANCE) 25 MG TABS tablet; Take 25 mg by mouth daily.  Screen for colon cancer -     Ambulatory referral to Gastroenterology  Chronic pain of both knees -     Ambulatory referral to Orthopedic Surgery  Mood disorder [facility]) Discussed treatment options, recommended start of medicine.  Patient very hesitant.  She is worried about the side effects.  Side effects went over in detail.  Offered referral to virtual behavioral health.  She does not want her husband to hear and he listens and on all of her phone calls.  She says she feels safe at home.  He does not do anything to hurt her.  Discussed inpatient counseling.  Patient says she wants to think about it first.  Contact phone number given for virtual behavioral health, she can contact them at any point and they can help set up counseling for her.  Follow up plan: Return in about 8 weeks (around 09/18/2018). [name] Kalya Troeger, MD [facility]

## 2018-08-01 ENCOUNTER — Other Ambulatory Visit: Payer: Self-pay | Admitting: Pharmacy Technician

## 2018-08-01 ENCOUNTER — Telehealth: Payer: Self-pay | Admitting: Pharmacist

## 2018-08-01 NOTE — Patient Outreach (Signed)
[facility]) Care Management  08/01/2018  Stacey PufferBetty J [name] 06/17/1961 [id number]   Patient was called regarding medicaiton assistance forms with Jardiance. Unfortunately, she did not answer the phone. HIPAA compliant message was left with a female who answered the phone.  Plan: Call patient back in 7-10 business days.   Beecher McardleKatina J. Quashawn Jewkes, PharmD, BCACP [facility] Clinical Pharmacist [phone]

## 2018-08-01 NOTE — Patient Outreach (Signed)
[facility]) Care Management  08/01/2018  Stacey PufferBetty J [name] 12/12/1960 [id number]   Received patient portion of B-I application for Jardiance. Faxed completed application to company.  Will follow up with company in 14-21 days (per current processing time) to check status of application.  Suzan SlickAshley N. Ernesta Ambleoleman, CPhT [facility] Care Management [phone]

## 2018-08-15 ENCOUNTER — Encounter: Payer: Self-pay | Admitting: Pediatrics

## 2018-08-17 ENCOUNTER — Other Ambulatory Visit: Payer: Self-pay | Admitting: Pharmacist

## 2018-08-18 NOTE — Patient Outreach (Signed)
[facility]) Care Management  08/18/2018  [name] [birth year] [id number]   Patient was called to follow up on medication assistance. HIPAA identifiers were obtained. [employer] was called on the patient's behalf and they said the patient needs to present an Extra Help Denial letter. Patient has partial extra help. Patient was asked if she can find the letter or go to [employer] and get another letter or documentation of her Extra Help/LIS status.   Medication: Jardiance  Plan: Follow up with the patient in 7-10 business days Send patient a return address stamped envelope.  Beecher Mcardle, PharmD, BCACP [facility] Clinical Pharmacist [phone]

## 2018-08-23 ENCOUNTER — Ambulatory Visit: Payer: PPO | Admitting: Pharmacy Technician

## 2018-08-28 ENCOUNTER — Telehealth: Payer: Self-pay | Admitting: Pediatrics

## 2018-08-28 DIAGNOSIS — E1142 Type 2 diabetes mellitus with diabetic polyneuropathy: Secondary | ICD-10-CM

## 2018-08-29 ENCOUNTER — Ambulatory Visit (INDEPENDENT_AMBULATORY_CARE_PROVIDER_SITE_OTHER): Payer: PPO

## 2018-08-29 ENCOUNTER — Encounter: Payer: Self-pay | Admitting: Pediatrics

## 2018-08-29 ENCOUNTER — Ambulatory Visit (INDEPENDENT_AMBULATORY_CARE_PROVIDER_SITE_OTHER): Payer: PPO | Admitting: Pediatrics

## 2018-08-29 ENCOUNTER — Other Ambulatory Visit: Payer: Self-pay

## 2018-08-29 VITALS — BP 122/74 | HR 82 | Temp 96.8°F

## 2018-08-29 DIAGNOSIS — M25561 Pain in right knee: Secondary | ICD-10-CM

## 2018-08-29 DIAGNOSIS — S79911A Unspecified injury of right hip, initial encounter: Secondary | ICD-10-CM | POA: Diagnosis not present

## 2018-08-29 DIAGNOSIS — M25551 Pain in right hip: Secondary | ICD-10-CM | POA: Diagnosis not present

## 2018-08-29 DIAGNOSIS — W19XXXA Unspecified fall, initial encounter: Secondary | ICD-10-CM | POA: Diagnosis not present

## 2018-08-29 DIAGNOSIS — S8991XA Unspecified injury of right lower leg, initial encounter: Secondary | ICD-10-CM | POA: Diagnosis not present

## 2018-08-29 MED ORDER — HYDROCODONE-ACETAMINOPHEN 5-325 MG PO TABS: 1.0000 | ORAL_TABLET | Freq: Three times a day (TID) | ORAL | 0 refills | Status: AC | PRN

## 2018-08-29 MED ORDER — EMPAGLIFLOZIN 25 MG PO TABS: 25.0000 mg | ORAL_TABLET | Freq: Every day | ORAL | 1 refills | Status: DC

## 2018-08-29 NOTE — Progress Notes (Signed)
  Subjective:   Patient ID: [name], female    DOB: 04-29-1961, 57 y.o.   MRN: [id number] CC: Hip Pain (Right side after a fall yesterday. Patient states she can not put weight on her right leg.) and Knee Pain  HPI: [name] is a 57 y.o. female   Here with grandson.  Yesterday she was walking down the ramp at her house to take the trash out.  She tripped on the rug at the bottom of the ramp.  She fell into the yard, landing on her right knee and then on her left side.  This happened last night.  She was able to get up, get back and side.  Grandson had to help her get into bed.  She is been using a walker at home for mobility since the incident.  She has pain with weightbearing on the right leg.  She points to her knee right lateral leg with where pain is the worst.  She does sometimes have pulling in her groin.  Bending and straightening the knee hurt.  Relevant past medical, surgical, family and social history reviewed. Allergies and medications reviewed and updated. Social History   Tobacco Use  Smoking Status Current Every Day Smoker  . Packs/day: 1.50  . Years: 34.00  . Pack years: 51.00  . Types: Cigarettes  Smokeless Tobacco Never Used   ROS: Per HPI   Objective:    BP 122/74   Pulse 82   Temp (!) 96.8 F (36 C) (Oral)   Wt Readings from Last 3 Encounters:  07/24/18 292 lb (132.5 kg)  06/15/18 294 lb (133.4 kg)  05/19/18 299 lb 6.4 oz (135.8 kg)    Gen: NAD, alert, cooperative with exam, NCAT, in wheelchair EYES: EOMI, no conjunctival injection, or no icterus CV: WWP Resp:  normal WOB Neuro: Alert and oriented, strength equal b/l UE and LE, coordination grossly normal MSK: Tender to palpation medial and lateral knee, patella.  Pain with minimal movement flexion or extension of the knee.  Able to rotate that hip with internal and external rotation a few inches right leg.  Mostly limited by pain in her knee with movements.  Does have some pulling lateral side of her  right upper leg with movement. Skin: No bruising  Assessment & Plan:  [name] was seen today for hip pain and knee pain.  Diagnoses and all orders for this visit:  Fall, initial encounter Hip pain, right Knee pain, right Does have significant arthritis right knee.  No fracture on x-ray of right knee or right hip.  In significant pain mostly with weightbearing and moving leg.  Exam somewhat limited by body habitus.  Gave #10 tablets hydrocodone, use sparingly for severe pain.  Weightbearing as tolerated.  Use walker at all times.  If ain not improving over the next few days but is now. -     DG HIP UNILAT W OR W/O PELVIS 2-3 VIEWS RIGHT; Future -     DG Knee 1-2 Views Right; Future -     HYDROcodone-acetaminophen (NORCO/VICODIN) 5-325 MG tablet; Take 1 tablet by mouth every 8 (eight) hours as needed for moderate pain.   Follow up plan: Return if symptoms worsen or fail to improve. Rex Kras, MD [facility]

## 2018-08-29 NOTE — Telephone Encounter (Signed)
RX sent to address that was given by patient. Patient aware

## 2018-08-29 NOTE — Telephone Encounter (Signed)
I am not sure, fine to send in Rx for jardiance 25mg  take once daily #90 with 1 refill

## 2018-08-29 NOTE — Patient Outreach (Signed)
[facility]) Care Management  08/29/2018   [name] 02-07-1961 [id number]  Subjective: Telephone call to the patient for assessment.  HIPAA verified.  The patient states that she was not feeling well.  She had a fall last night she tripped over a rug.  She states that she hurt her right leg and left hip.  The pain is a 8/10.  The patient has an appointment to See Dr Oswaldo Done at 445 pm today.  Discussed fall precautions with the patient.  She verbalized understanding.She states that she did not check her blood sugar today but has ranged from 120 to 206.  She states that she is still monitoring her diet and taking her medications as prescribed.  The patient states that she is planning to get a flu shot in November.    Current Medications:  Current Outpatient Medications  Medication Sig Dispense Refill  . aspirin EC 325 MG tablet Take 325 mg by mouth every morning.     . colestipol (COLESTID) 1 g tablet Take 2 tablets (2 g total) by mouth 2 (two) times daily. 120 tablet 4  . empagliflozin (JARDIANCE) 25 MG TABS tablet Take 25 mg by mouth daily. 90 tablet 1  . furosemide (LASIX) 40 MG tablet Take 1 tablet (40 mg total) by mouth daily. 90 tablet 1  . gabapentin (NEURONTIN) 300 MG capsule Take 2 tabs in the morning, 2 tab at noon, 2 tabs in the evening 540 capsule 1  . glimepiride (AMARYL) 2 MG tablet Take 1 tablet (2 mg total) by mouth daily before breakfast. 30 tablet 3  . glucose blood (ACCU-CHEK AVIVA PLUS) test strip Use aTest blood sugar once daily 100 each 2  . glucose blood (ONETOUCH VERIO) test strip Test BID and prn  e11.9 100 each 11  . lisinopril (PRINIVIL,ZESTRIL) 10 MG tablet Take 1 tablet (10 mg total) by mouth daily. 90 tablet 3  . metFORMIN (GLUCOPHAGE) 500 MG tablet Take 2 tablets (1,000 mg total) by mouth 2 (two) times daily with a meal. 360 tablet 1  . metoprolol tartrate (LOPRESSOR) 25 MG tablet Take 1 tablet (25 mg total) by mouth 2 (two) times daily. 180  tablet 3  . nitroGLYCERIN (NITROSTAT) 0.4 MG SL tablet See AUX Label 12 tablet 0  . omeprazole (PRILOSEC) 40 MG capsule TAKE 1 CAPSULE BY MOUTH ONCE DAILY 30 MINUTES BEFORE A MEAL 90 capsule 3  . ONETOUCH DELICA LANCETS 33G MISC Check blood sugar BID and prn  E11.9 100 each 11  . rosuvastatin (CRESTOR) 20 MG tablet Take 1 tablet (20 mg total) by mouth at bedtime. 90 tablet 3   No current facility-administered medications for this visit.     Functional Status:  In your present state of health, do you have any difficulty performing the following activities: 11/23/2017  Hearing? N  Vision? N  Difficulty concentrating or making decisions? N  Walking or climbing stairs? Y  Comment uses cane for help with balance due to leg and back pain  Dressing or bathing? N  Doing errands, shopping? Y  Comment Husband or grandson goes with patient to appointments or grocery store  Preparing Food and eating ? N  Using the Toilet? N  In the past six months, have you accidently leaked urine? Y  Comment wears adult pull ups  Do you have problems with loss of bowel control? Y  Comment Feels urgency with bowel movements after eating, has had accidents  Managing your Medications? N  Managing  your Finances? N  Housekeeping or managing your Housekeeping? N  Some recent data might be hidden    Fall/Depression Screening: Fall Risk  08/29/2018 07/07/2018 06/15/2018  Falls in the past year? Yes No No  Comment - - -  Number falls in past yr: 1 - -  Injury with Fall? Yes - -  Risk Factor Category  - - -  Risk for fall due to : - - -  Follow up Falls evaluation completed;Education provided - -   PHQ 2/9 Scores 07/24/2018 06/15/2018 05/19/2018 04/12/2018 02/20/2018 12/19/2017 12/08/2017  PHQ - 2 Score 6 5 0 0 2 0 2  PHQ- 9 Score 25 16 - - 12 - 11  Exception Documentation - - - - - - -  Not completed - - - - - - -    Assessment: Patient will continue to benefit from health coach outreach for disease management and  support.  [facility] CM Care Plan Problem One     Most Recent Value  [facility] Long Term Goal   in 30 days the patient will maitain her a1c  of 6.9 .  [facility]N Long Term Goal Start Date  08/29/18  Interventions for Problem One Long Term Goal  Discussed with th epatient about checking blood sugars daily , diet  and medication administration.       Plan: RN Health Coach will contact patient in the month of November and patient agrees to next outreach.  [name] RN, BSN, [facility] RN Health Coach Disease Management [facility] Dial:  [phone]  Fax: [phone]

## 2018-08-30 ENCOUNTER — Other Ambulatory Visit: Payer: Self-pay | Admitting: Pharmacist

## 2018-08-30 NOTE — Patient Outreach (Signed)
[facility]) Care Management  08/30/2018  [name] Apr 17, 1961 [id number]   Patient was called to follow up on medication assistance with Jardiance. HIPAA identifiers were obtained. Patient said she was not feeling well because she fell earlier in the week. She reported going to her PCP yesterday and was given Hydrocodone/APAP 5/325.   Today's Vitals   08/30/18 1310  PainSc: 9     She rated her pain 9/10 at the time of our call and said she is taking her medications as prescribed. Patient was encouraged to call her provider back about her pain . Patient said she has not been able to find the [employer] we have been requesting from her (LIS verification) to send to the patient assistance company.  Plan: Close patient's case for now. (She has been asked about this paperwork multiple times) Will reopen the patient's case in the future if she is able to provide the necessary paperwork.   Beecher Mcardle, PharmD, BCACP [facility] Clinical Pharmacist [phone]

## 2018-09-18 ENCOUNTER — Encounter: Payer: Self-pay | Admitting: Pediatrics

## 2018-09-18 ENCOUNTER — Ambulatory Visit (INDEPENDENT_AMBULATORY_CARE_PROVIDER_SITE_OTHER): Payer: PPO | Admitting: Pediatrics

## 2018-09-18 VITALS — BP 122/71 | HR 78 | Temp 97.2°F | Ht 62.0 in | Wt 288.0 lb

## 2018-09-18 DIAGNOSIS — Z1239 Encounter for other screening for malignant neoplasm of breast: Secondary | ICD-10-CM | POA: Diagnosis not present

## 2018-09-18 DIAGNOSIS — R2231 Localized swelling, mass and lump, right upper limb: Secondary | ICD-10-CM | POA: Diagnosis not present

## 2018-09-18 DIAGNOSIS — I10 Essential (primary) hypertension: Secondary | ICD-10-CM | POA: Diagnosis not present

## 2018-09-18 DIAGNOSIS — E1142 Type 2 diabetes mellitus with diabetic polyneuropathy: Secondary | ICD-10-CM

## 2018-09-18 DIAGNOSIS — R296 Repeated falls: Secondary | ICD-10-CM

## 2018-09-18 DIAGNOSIS — F329 Major depressive disorder, single episode, unspecified: Secondary | ICD-10-CM

## 2018-09-18 DIAGNOSIS — R4589 Other symptoms and signs involving emotional state: Secondary | ICD-10-CM

## 2018-09-18 LAB — BAYER DCA HB A1C WAIVED: HB A1C (BAYER DCA - WAIVED): 7.9 % — ABNORMAL HIGH (ref <7.0)

## 2018-09-18 MED ORDER — GABAPENTIN 300 MG PO CAPS: ORAL_CAPSULE | ORAL | 1 refills | Status: DC

## 2018-09-18 MED ORDER — GLIMEPIRIDE 2 MG PO TABS: 2.0000 mg | ORAL_TABLET | Freq: Every day | ORAL | 3 refills | Status: DC

## 2018-09-18 NOTE — Progress Notes (Signed)
  Subjective:   Patient ID: [name], female    DOB: [birth year], 59 y.o.   MRN: [id number] CC: Medical Management of Chronic Issues  HPI: [name] is a 57 y.o. female   Diabetes: Drinking diet lemonade, 1 to 2 glasses a day.  Water, black coffee.  No sodas.  Trying to avoid sugary foods.  Pain in the right leg: Took 3 of the hydrocodone following fall last visit.  Legs continue to ache.  She has osteoarthritis bilateral knees.  She has an upcoming appoint with orthopedics.  Gabapentin helps some.  Taking 2 tabs morning noon and night.  She is open to starting physical therapy now.  Depressed mood: Remains stressful at home.  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  Feels safe at home.  Does not want to start on a medication right now.  Yesterday noticed a lump under her right arm.  Hypertension: Taking medicine regularly.  No lightheadedness, shortness of breath, chest pain.  Relevant past medical, surgical, family and social history reviewed. Allergies and medications reviewed and updated. Social History   Tobacco Use  Smoking Status Current Every Day Smoker  . Packs/day: 1.50  . Years: 34.00  . Pack years: 51.00  . Types: Cigarettes  Smokeless Tobacco Never Used   ROS: Per HPI   Objective:    BP 122/71   Pulse 78   Temp (!) 97.2 F (36.2 C) (Oral)   Ht 5\' 2"  (1.575 m)   Wt 288 lb (130.6 kg)   BMI 52.68 kg/m   Wt Readings from Last 3 Encounters:  09/18/18 288 lb (130.6 kg)  07/24/18 292 lb (132.5 kg)  06/15/18 294 lb (133.4 kg)    Gen: NAD, alert, cooperative with exam, NCAT EYES: EOMI, no conjunctival injection, or no icterus ENT:  OP without erythema LYMPH: no cervical LAD CV: NRRR, normal S1/S2, no murmur, distal pulses 2+ b/l Resp: CTABL, no wheezes, normal WOB Abd: +BS, soft, NTND. no guarding or organomegaly Ext: Non pitting edema present b/l feet, warm Neuro: Alert and oriented MSK: normal muscle bulk Skin: Right axilla with <1cm palpable nodule in the  skin.  Easily mobile.  Assessment & Plan:  Daisey was seen today for medical management of chronic issues.  Diagnoses and all orders for this visit:  Type 2 diabetes mellitus with diabetic polyneuropathy, without long-term current use of insulin (HCC) A1c up to 7.9, was 6.9 last time.  Continue to avoid sugary foods.  Cut back on carbs.  Continue Jardiance, metformin, glimepiride. -     Bayer DCA Hb A1c Waived -     glimepiride (AMARYL) 2 MG tablet; Take 1 tablet (2 mg total) by mouth daily before breakfast. -     gabapentin (NEURONTIN) 300 MG capsule; Take 2 tabs in the morning, 2 tab at noon, 3 tabs in the evening  Mass of right axilla Most likely cyst, given location we will get diagnostic mammogram. -     MM Digital Diagnostic Unilat R; Future  Screening for breast cancer -     MM Digital Screening; Future  Depressed mood Stable, has support in her family.  Declines referral to virtual behavioral health, start of medication.  Essential hypertension Stable, continue current meds  Frequent falls Will refer to home health physical therapy for assessment, gait training and strength training  Follow up plan: Return in about 2 months (around 11/18/2018). Rex Kras, MD [facility] [place]

## 2018-09-25 DIAGNOSIS — E669 Obesity, unspecified: Secondary | ICD-10-CM | POA: Diagnosis not present

## 2018-09-25 DIAGNOSIS — M170 Bilateral primary osteoarthritis of knee: Secondary | ICD-10-CM | POA: Diagnosis not present

## 2018-09-25 DIAGNOSIS — E1142 Type 2 diabetes mellitus with diabetic polyneuropathy: Secondary | ICD-10-CM | POA: Diagnosis not present

## 2018-09-25 DIAGNOSIS — I10 Essential (primary) hypertension: Secondary | ICD-10-CM | POA: Diagnosis not present

## 2018-09-25 DIAGNOSIS — F17210 Nicotine dependence, cigarettes, uncomplicated: Secondary | ICD-10-CM | POA: Diagnosis not present

## 2018-09-28 ENCOUNTER — Other Ambulatory Visit: Payer: PPO

## 2018-09-28 ENCOUNTER — Other Ambulatory Visit: Payer: Self-pay | Admitting: Pediatrics

## 2018-09-28 DIAGNOSIS — N6310 Unspecified lump in the right breast, unspecified quadrant: Secondary | ICD-10-CM

## 2018-09-28 DIAGNOSIS — M1712 Unilateral primary osteoarthritis, left knee: Secondary | ICD-10-CM | POA: Diagnosis not present

## 2018-09-28 DIAGNOSIS — M1711 Unilateral primary osteoarthritis, right knee: Secondary | ICD-10-CM | POA: Diagnosis not present

## 2018-09-28 DIAGNOSIS — M170 Bilateral primary osteoarthritis of knee: Secondary | ICD-10-CM | POA: Diagnosis not present

## 2018-10-02 ENCOUNTER — Ambulatory Visit: Payer: Self-pay

## 2018-10-02 ENCOUNTER — Ambulatory Visit (INDEPENDENT_AMBULATORY_CARE_PROVIDER_SITE_OTHER): Payer: PPO

## 2018-10-02 DIAGNOSIS — F17210 Nicotine dependence, cigarettes, uncomplicated: Secondary | ICD-10-CM | POA: Diagnosis not present

## 2018-10-02 DIAGNOSIS — E669 Obesity, unspecified: Secondary | ICD-10-CM

## 2018-10-02 DIAGNOSIS — E1142 Type 2 diabetes mellitus with diabetic polyneuropathy: Secondary | ICD-10-CM | POA: Diagnosis not present

## 2018-10-02 DIAGNOSIS — Z7984 Long term (current) use of oral hypoglycemic drugs: Secondary | ICD-10-CM | POA: Diagnosis not present

## 2018-10-02 DIAGNOSIS — M170 Bilateral primary osteoarthritis of knee: Secondary | ICD-10-CM | POA: Diagnosis not present

## 2018-10-02 DIAGNOSIS — I10 Essential (primary) hypertension: Secondary | ICD-10-CM | POA: Diagnosis not present

## 2018-10-02 DIAGNOSIS — Z6834 Body mass index (BMI) 34.0-34.9, adult: Secondary | ICD-10-CM

## 2018-10-02 DIAGNOSIS — Z7982 Long term (current) use of aspirin: Secondary | ICD-10-CM

## 2018-10-02 DIAGNOSIS — Z9181 History of falling: Secondary | ICD-10-CM | POA: Diagnosis not present

## 2018-10-03 ENCOUNTER — Other Ambulatory Visit: Payer: Self-pay

## 2018-10-03 NOTE — Patient Outreach (Signed)
[facility]) Care Management  10/03/2018   [name] 03/31/1961 [id number]  Subjective: Successful telephone outreach to the patient for assessment. HIPAA verified.  The patient states that she is doing well.  She denies any pain or falls.  Discussed with the patient about her CBG readings.  In the morning of 11/22 231 and 11/23 220 fasting readings.  During the day post prandial the patient's reading have been ranging between 149-185. The patient had her a1c checked on 11/11 and it went from a 6.9 three months ago to 7.9.  Discussed with the patient about her food and fluid intake.  The patient verbalized understanding. She is adherent with her medications. She states that she was started on a new medication Indomethacin 20 mg three times a day.  She also has physical therapy coming to the home twice a week to help build strength in her legs. The patient states that she has an appointment with Dr. Oswaldo DoneVincent in January 2020.   Current Medications:  Current Outpatient Medications  Medication Sig Dispense Refill  . aspirin EC 325 MG tablet Take 325 mg by mouth every morning.     . colestipol (COLESTID) 1 g tablet Take 2 tablets (2 g total) by mouth 2 (two) times daily. 120 tablet 4  . empagliflozin (JARDIANCE) 25 MG TABS tablet Take 25 mg by mouth daily. 90 tablet 1  . furosemide (LASIX) 40 MG tablet Take 1 tablet (40 mg total) by mouth daily. 90 tablet 1  . gabapentin (NEURONTIN) 300 MG capsule Take 2 tabs in the morning, 2 tab at noon, 3 tabs in the evening 630 capsule 1  . glimepiride (AMARYL) 2 MG tablet Take 1 tablet (2 mg total) by mouth daily before breakfast. 30 tablet 3  . glucose blood (ACCU-CHEK AVIVA PLUS) test strip Use aTest blood sugar once daily 100 each 2  . glucose blood (ONETOUCH VERIO) test strip Test BID and prn  e11.9 100 each 11  . indomethacin (INDOCIN) 25 MG capsule Take 25 mg by mouth 3 (three) times daily with meals.    Marland Kitchen. lisinopril (PRINIVIL,ZESTRIL)  10 MG tablet Take 1 tablet (10 mg total) by mouth daily. 90 tablet 3  . metFORMIN (GLUCOPHAGE) 500 MG tablet Take 2 tablets (1,000 mg total) by mouth 2 (two) times daily with a meal. 360 tablet 1  . metoprolol tartrate (LOPRESSOR) 25 MG tablet Take 1 tablet (25 mg total) by mouth 2 (two) times daily. 180 tablet 3  . nitroGLYCERIN (NITROSTAT) 0.4 MG SL tablet See AUX Label 12 tablet 0  . omeprazole (PRILOSEC) 40 MG capsule TAKE 1 CAPSULE BY MOUTH ONCE DAILY 30 MINUTES BEFORE A MEAL 90 capsule 3  . ONETOUCH DELICA LANCETS 33G MISC Check blood sugar BID and prn  E11.9 100 each 11  . rosuvastatin (CRESTOR) 20 MG tablet Take 1 tablet (20 mg total) by mouth at bedtime. 90 tablet 3   No current facility-administered medications for this visit.     Functional Status:  In your present state of health, do you have any difficulty performing the following activities: 11/23/2017  Hearing? N  Vision? N  Difficulty concentrating or making decisions? N  Walking or climbing stairs? Y  Comment uses cane for help with balance due to leg and back pain  Dressing or bathing? N  Doing errands, shopping? Y  Comment Husband or grandson goes with patient to appointments or grocery store  Preparing Food and eating ? N  Using the Toilet?  N  In the past six months, have you accidently leaked urine? Y  Comment wears adult pull ups  Do you have problems with loss of bowel control? Y  Comment Feels urgency with bowel movements after eating, has had accidents  Managing your Medications? N  Managing your Finances? N  Housekeeping or managing your Housekeeping? N  Some recent data might be hidden    Fall/Depression Screening: Fall Risk  10/03/2018 09/18/2018 08/29/2018  Falls in the past year? 0 1 Yes  Comment - - -  Number falls in past yr: - 1 1  Injury with Fall? - 1 Yes  Risk Factor Category  - - -  Risk for fall due to : - History of fall(s) -  Follow up - Education provided Falls evaluation  completed;Education provided   [facility] 2/9 Scores 07/24/2018 06/15/2018 05/19/2018 04/12/2018 02/20/2018 12/19/2017 12/08/2017  PHQ - 2 Score 6 5 0 0 2 0 2  PHQ- 9 Score 25 16 - - 12 - 11  Exception Documentation - - - - - - -  Not completed - - - - - - -    Assessment: Patient will continue to benefit from health coach outreach for disease management and support.  [facility] CM Care Plan Problem One     Most Recent Value  [facility] Long Term Goal   in 60 days the patient will lower  her a1c  of 7.9 by 1-2 points .  [facility] Long Term Goal Start Date  10/03/18  Interventions for Problem One Long Term Goal  Reviewed cbg record,  discussed food and fluid  intake, and discussed medication management     Plan: RN Health Coach will contact patient in the month of January and patient agrees to next outreach.     [name] RN, BSN, [facility] RN Health Coach Disease Management [facility] Dial:  [phone]  Fax: [phone]

## 2018-10-09 DIAGNOSIS — E1142 Type 2 diabetes mellitus with diabetic polyneuropathy: Secondary | ICD-10-CM | POA: Diagnosis not present

## 2018-10-09 DIAGNOSIS — F17210 Nicotine dependence, cigarettes, uncomplicated: Secondary | ICD-10-CM | POA: Diagnosis not present

## 2018-10-09 DIAGNOSIS — M170 Bilateral primary osteoarthritis of knee: Secondary | ICD-10-CM | POA: Diagnosis not present

## 2018-10-09 DIAGNOSIS — I10 Essential (primary) hypertension: Secondary | ICD-10-CM | POA: Diagnosis not present

## 2018-10-09 DIAGNOSIS — E669 Obesity, unspecified: Secondary | ICD-10-CM | POA: Diagnosis not present

## 2018-10-10 ENCOUNTER — Other Ambulatory Visit: Payer: Self-pay | Admitting: Pediatrics

## 2018-10-10 DIAGNOSIS — N6310 Unspecified lump in the right breast, unspecified quadrant: Secondary | ICD-10-CM

## 2018-10-17 ENCOUNTER — Ambulatory Visit (HOSPITAL_COMMUNITY)
Admission: RE | Admit: 2018-10-17 | Discharge: 2018-10-17 | Disposition: A | Discharge status: Home / Self Care | Payer: PPO | Source: Ambulatory Visit | Attending: Pediatrics | Admitting: Pediatrics

## 2018-10-17 ENCOUNTER — Ambulatory Visit (HOSPITAL_COMMUNITY): Payer: PPO

## 2018-10-17 DIAGNOSIS — N6011 Diffuse cystic mastopathy of right breast: Secondary | ICD-10-CM | POA: Diagnosis not present

## 2018-10-17 DIAGNOSIS — R928 Other abnormal and inconclusive findings on diagnostic imaging of breast: Secondary | ICD-10-CM | POA: Diagnosis not present

## 2018-10-17 DIAGNOSIS — N6310 Unspecified lump in the right breast, unspecified quadrant: Secondary | ICD-10-CM | POA: Insufficient documentation

## 2018-10-23 DIAGNOSIS — M170 Bilateral primary osteoarthritis of knee: Secondary | ICD-10-CM | POA: Diagnosis not present

## 2018-10-23 DIAGNOSIS — I10 Essential (primary) hypertension: Secondary | ICD-10-CM | POA: Diagnosis not present

## 2018-10-23 DIAGNOSIS — E1142 Type 2 diabetes mellitus with diabetic polyneuropathy: Secondary | ICD-10-CM | POA: Diagnosis not present

## 2018-10-23 DIAGNOSIS — E669 Obesity, unspecified: Secondary | ICD-10-CM | POA: Diagnosis not present

## 2018-10-23 DIAGNOSIS — F17210 Nicotine dependence, cigarettes, uncomplicated: Secondary | ICD-10-CM | POA: Diagnosis not present

## 2018-11-09 DIAGNOSIS — I10 Essential (primary) hypertension: Secondary | ICD-10-CM | POA: Diagnosis not present

## 2018-11-09 DIAGNOSIS — E1142 Type 2 diabetes mellitus with diabetic polyneuropathy: Secondary | ICD-10-CM | POA: Diagnosis not present

## 2018-11-09 DIAGNOSIS — M170 Bilateral primary osteoarthritis of knee: Secondary | ICD-10-CM | POA: Diagnosis not present

## 2018-11-09 DIAGNOSIS — F17210 Nicotine dependence, cigarettes, uncomplicated: Secondary | ICD-10-CM | POA: Diagnosis not present

## 2018-11-09 DIAGNOSIS — E669 Obesity, unspecified: Secondary | ICD-10-CM | POA: Diagnosis not present

## 2018-11-14 ENCOUNTER — Other Ambulatory Visit: Payer: Self-pay | Admitting: Pediatrics

## 2018-11-14 DIAGNOSIS — E1142 Type 2 diabetes mellitus with diabetic polyneuropathy: Secondary | ICD-10-CM

## 2018-11-22 ENCOUNTER — Other Ambulatory Visit: Payer: Self-pay

## 2018-11-22 NOTE — Patient Outreach (Signed)
[facility]) Care Management  11/22/2018   [name] September 26, 1961 [id number]  Subjective: Successful outreach to the patient for assessment.  HIPAA verified.  The patient states that she is doing fair.  She denies any pain or falls.  She states that she had not checked her blood sugar this morning but it has been ranging from 156-270.The patient states that she has been trying to cut back but ate some things over the holidays that she should not have. Notified the patient that I will send her some information called [employer] for her to review.  She verbalized understanding.  She states that she is taking her medications.  She was unsure if she had an appointment this month because she did not get a reminder.  She is going to the doctor's office with her husband and will ask about the appointment.   Current Medications:  Current Outpatient Medications  Medication Sig Dispense Refill  . aspirin EC 325 MG tablet Take 325 mg by mouth every morning.     . colestipol (COLESTID) 1 g tablet Take 2 tablets (2 g total) by mouth 2 (two) times daily. 120 tablet 4  . empagliflozin (JARDIANCE) 25 MG TABS tablet Take 25 mg by mouth daily. 90 tablet 1  . furosemide (LASIX) 40 MG tablet Take 1 tablet (40 mg total) by mouth daily. 90 tablet 1  . gabapentin (NEURONTIN) 300 MG capsule TAKE 2 CAPSULES BY MOUTH ONCE IN THE MORNING, AND 1 CAPSULE AT NOON, AND 2 CAPSULES ONCE IN THE EVENING 450 capsule 0  . glimepiride (AMARYL) 2 MG tablet Take 1 tablet (2 mg total) by mouth daily before breakfast. 30 tablet 3  . glucose blood (ACCU-CHEK AVIVA PLUS) test strip Use aTest blood sugar once daily 100 each 2  . glucose blood (ONETOUCH VERIO) test strip Test BID and prn  e11.9 100 each 11  . indomethacin (INDOCIN) 25 MG capsule Take 25 mg by mouth 3 (three) times daily with meals.    Marland Kitchen lisinopril (PRINIVIL,ZESTRIL) 10 MG tablet Take 1 tablet (10 mg total) by mouth daily. 90 tablet 3  .  metFORMIN (GLUCOPHAGE) 500 MG tablet Take 2 tablets (1,000 mg total) by mouth 2 (two) times daily with a meal. 360 tablet 1  . metoprolol tartrate (LOPRESSOR) 25 MG tablet Take 1 tablet (25 mg total) by mouth 2 (two) times daily. 180 tablet 3  . nitroGLYCERIN (NITROSTAT) 0.4 MG SL tablet See AUX Label 12 tablet 0  . omeprazole (PRILOSEC) 40 MG capsule TAKE 1 CAPSULE BY MOUTH ONCE DAILY 30 MINUTES BEFORE A MEAL 90 capsule 3  . ONETOUCH DELICA LANCETS 33G MISC Check blood sugar BID and prn  E11.9 100 each 11  . rosuvastatin (CRESTOR) 20 MG tablet Take 1 tablet (20 mg total) by mouth at bedtime. 90 tablet 3   No current facility-administered medications for this visit.     Functional Status:  In your present state of health, do you have any difficulty performing the following activities: 11/23/2017  Hearing? N  Vision? N  Difficulty concentrating or making decisions? N  Walking or climbing stairs? Y  Comment uses cane for help with balance due to leg and back pain  Dressing or bathing? N  Doing errands, shopping? Y  Comment Husband or grandson goes with patient to appointments or grocery store  Preparing Food and eating ? N  Using the Toilet? N  In the past six months, have you accidently leaked urine?  Y  Comment wears adult pull ups  Do you have problems with loss of bowel control? Y  Comment Feels urgency with bowel movements after eating, has had accidents  Managing your Medications? N  Managing your Finances? N  Housekeeping or managing your Housekeeping? N  Some recent data might be hidden    Fall/Depression Screening: Fall Risk  11/22/2018 11/22/2018 10/03/2018  Falls in the past year? 0 0 0  Comment - - -  Number falls in past yr: - - -  Injury with Fall? - - -  Risk Factor Category  - - -  Risk for fall due to : - - -  Follow up - - -   PHQ 2/9 Scores 07/24/2018 06/15/2018 05/19/2018 04/12/2018 02/20/2018 12/19/2017 12/08/2017  PHQ - 2 Score 6 5 0 0 2 0 2  PHQ- 9 Score 25 16 - -  12 - 11  Exception Documentation - - - - - - -  Not completed - - - - - - -    Assessment: Patient will continue to benefit from health coach outreach for disease management and support.  [facility] CM Care Plan Problem One     Most Recent Value  [facility] Long Term Goal   in 30 days the patient will verbalize that  her a1c  of 7.9 has been lowered by 1-2 points .  [facility] Long Term Goal Start Date  11/22/18  Interventions for Problem One Long Term Goal  Talked with the  patient about diet , taking her medications and going to her appointments.       Plan:  RN Health Coach will contact patient in the month of February and patient agrees to next outreach.   [name] RN, BSN, [facility] RN Health Coach Disease Management [facility] Dial:  [phone]  Fax: [phone]

## 2018-11-23 ENCOUNTER — Encounter: Payer: Self-pay | Admitting: Pediatrics

## 2018-11-23 ENCOUNTER — Ambulatory Visit (INDEPENDENT_AMBULATORY_CARE_PROVIDER_SITE_OTHER): Payer: PPO | Admitting: Pediatrics

## 2018-11-23 VITALS — BP 125/69 | HR 74 | Temp 97.3°F | Ht 62.0 in | Wt 298.0 lb

## 2018-11-23 DIAGNOSIS — F329 Major depressive disorder, single episode, unspecified: Secondary | ICD-10-CM | POA: Diagnosis not present

## 2018-11-23 DIAGNOSIS — R5383 Other fatigue: Secondary | ICD-10-CM | POA: Diagnosis not present

## 2018-11-23 DIAGNOSIS — R05 Cough: Secondary | ICD-10-CM

## 2018-11-23 DIAGNOSIS — I10 Essential (primary) hypertension: Secondary | ICD-10-CM

## 2018-11-23 DIAGNOSIS — R4589 Other symptoms and signs involving emotional state: Secondary | ICD-10-CM

## 2018-11-23 DIAGNOSIS — E1142 Type 2 diabetes mellitus with diabetic polyneuropathy: Secondary | ICD-10-CM | POA: Diagnosis not present

## 2018-11-23 DIAGNOSIS — G4733 Obstructive sleep apnea (adult) (pediatric): Secondary | ICD-10-CM | POA: Diagnosis not present

## 2018-11-23 DIAGNOSIS — R252 Cramp and spasm: Secondary | ICD-10-CM

## 2018-11-23 DIAGNOSIS — R059 Cough, unspecified: Secondary | ICD-10-CM

## 2018-11-23 NOTE — Progress Notes (Signed)
  Subjective:   Patient ID: [name], female    DOB: 1960-11-29, 57 y.o.   MRN: [id number] CC: Fatigue  HPI: [name] is a 58 y.o. female   Last couple months.  Not able to motivate herself to diet as regularly as she thinks she should.  Not able to check her blood sugars regularly because she gave her meter to her father.  Feeling shaky off and on.  Says she is been craving sweets and eating more in the last few weeks.  Mood is been down because of ongoing stress.  OSA: Supposed to wear BiPAP nightly, has not been able to afford the supplies now that she no longer has Medicaid.  Hypertension: Taking Lasix 40 mg once daily.  Has muscle cramps at times at night.  Has been coughing off and on over the last few weeks.  Easily falls asleep, sitting at the kitchen table, sometimes falls asleep watching TV when she is not meaning to.  Relevant past medical, surgical, family and social history reviewed. Allergies and medications reviewed and updated. Social History   Tobacco Use  Smoking Status Current Every Day Smoker  . Packs/day: 1.50  . Years: 34.00  . Pack years: 51.00  . Types: Cigarettes  Smokeless Tobacco Never Used   ROS: Per HPI   Objective:    BP 125/69   Pulse 74   Temp (!) 97.3 F (36.3 C) (Oral)   Ht '5\' 2"'$  (1.575 m)   Wt 298 lb (135.2 kg)   BMI 54.50 kg/m   Wt Readings from Last 3 Encounters:  11/23/18 298 lb (135.2 kg)  09/18/18 288 lb (130.6 kg)  07/24/18 292 lb (132.5 kg)    Gen: NAD, alert, cooperative with exam, NCAT EYES: EOMI, no conjunctival injection, or no icterus CV: NRRR, normal S1/S2, no murmur, distal pulses 2+ b/l Resp: Rhonchi present right upper and lower lobes, clear left lung fields, no wheezes, normal WOB Abd: +BS, soft, NTND. Ext: Non-pitting edema present bilateral lower legs, warm Neuro: Alert and oriented MSK: normal muscle bulk  Assessment & Plan:  [name] was seen today for fatigue.  Diagnoses and all orders for this  visit:  OSA (obstructive sleep apnea) Needs to get back onto using biPAP.  Discussed using a just 20 minutes at a time at first, working up to using it throughout the night. -     Referral to Chronic Care Management Services  Type 2 diabetes mellitus with diabetic polyneuropathy, without long-term current use of insulin ([place]) Due for A1c next month.  Check blood sugars regularly at home. -     CBC with Differential/Platelet -     CMP14+EGFR  Depressed mood Continue to offer support, not interested in starting medicines at this time.  May be able to get counseling through CCM services.  Referral in.  Essential hypertension, benign Check labs.  Continue current meds. -     CBC with Differential/Platelet -     CMP14+EGFR  Cough -     DG Chest 2 View; Future  Leg cramps Checking labs today.  Other fatigue Likely multifactorial.  Patient to check blood sugars at home, will try to get her back on treatment for OSA.  Follow up plan: Return in about 4 weeks (around 12/21/2018). Assunta Found, MD [place]

## 2018-11-24 LAB — CBC WITH DIFFERENTIAL/PLATELET
Basophils Absolute: 0.1 10*3/uL (ref 0.0–0.2)
Basos: 2 %
EOS (ABSOLUTE): 0.8 10*3/uL — ABNORMAL HIGH (ref 0.0–0.4)
Eos: 9 %
Hematocrit: 39.3 % (ref 34.0–46.6)
Hemoglobin: 13.3 g/dL (ref 11.1–15.9)
Immature Grans (Abs): 0 10*3/uL (ref 0.0–0.1)
Immature Granulocytes: 0 %
Lymphocytes Absolute: 3 10*3/uL (ref 0.7–3.1)
Lymphs: 34 %
MCH: 32.5 pg (ref 26.6–33.0)
MCHC: 33.8 g/dL (ref 31.5–35.7)
MCV: 96 fL (ref 79–97)
Monocytes Absolute: 0.4 10*3/uL (ref 0.1–0.9)
Monocytes: 5 %
Neutrophils Absolute: 4.5 10*3/uL (ref 1.4–7.0)
Neutrophils: 50 %
Platelets: 106 10*3/uL — ABNORMAL LOW (ref 150–450)
RBC: 4.09 x10E6/uL (ref 3.77–5.28)
RDW: 16.4 % — ABNORMAL HIGH (ref 11.7–15.4)
WBC: 8.9 10*3/uL (ref 3.4–10.8)

## 2018-11-24 LAB — CMP14+EGFR
ALT: 15 IU/L (ref 0–32)
AST: 14 IU/L (ref 0–40)
Albumin/Globulin Ratio: 1.9 (ref 1.2–2.2)
Albumin: 4.4 g/dL (ref 3.5–5.5)
Alkaline Phosphatase: 95 IU/L (ref 39–117)
BUN/Creatinine Ratio: 12 (ref 9–23)
BUN: 14 mg/dL (ref 6–24)
Bilirubin Total: 0.3 mg/dL (ref 0.0–1.2)
CO2: 21 mmol/L (ref 20–29)
Calcium: 9.4 mg/dL (ref 8.7–10.2)
Chloride: 104 mmol/L (ref 96–106)
Creatinine, Ser: 1.13 mg/dL — ABNORMAL HIGH (ref 0.57–1.00)
GFR calc Af Amer: 61 mL/min/{1.73_m2} (ref >59)
GFR calc non Af Amer: 53 mL/min/{1.73_m2} — ABNORMAL LOW (ref >59)
Globulin, Total: 2.3 g/dL (ref 1.5–4.5)
Glucose: 151 mg/dL — ABNORMAL HIGH (ref 65–99)
Potassium: 5 mmol/L (ref 3.5–5.2)
Sodium: 143 mmol/L (ref 134–144)
Total Protein: 6.7 g/dL (ref 6.0–8.5)

## 2018-11-27 ENCOUNTER — Other Ambulatory Visit: Payer: Self-pay | Admitting: Pediatrics

## 2018-11-27 ENCOUNTER — Ambulatory Visit (INDEPENDENT_AMBULATORY_CARE_PROVIDER_SITE_OTHER): Payer: PPO | Admitting: *Deleted

## 2018-11-27 ENCOUNTER — Encounter: Payer: Self-pay | Admitting: *Deleted

## 2018-11-27 VITALS — BP 122/76 | HR 80 | Wt 293.0 lb

## 2018-11-27 DIAGNOSIS — Z0000 Encounter for general adult medical examination without abnormal findings: Secondary | ICD-10-CM | POA: Diagnosis not present

## 2018-11-27 DIAGNOSIS — Z23 Encounter for immunization: Secondary | ICD-10-CM

## 2018-11-27 DIAGNOSIS — Z1211 Encounter for screening for malignant neoplasm of colon: Secondary | ICD-10-CM

## 2018-11-27 DIAGNOSIS — E1142 Type 2 diabetes mellitus with diabetic polyneuropathy: Secondary | ICD-10-CM

## 2018-11-27 DIAGNOSIS — Z1212 Encounter for screening for malignant neoplasm of rectum: Secondary | ICD-10-CM

## 2018-11-27 MED ORDER — GABAPENTIN 300 MG PO CAPS: ORAL_CAPSULE | ORAL | 1 refills | Status: DC

## 2018-11-27 NOTE — Progress Notes (Signed)
[name] is here today for her AWV.  She states she called to get her Gabapentin 300 mg refilled, and was told it was too early for  refill.  She states that she discussed with Dr. Oswaldo Done at last visit that she is taking 2 tablets three times per day.  The previous prescription states 2 tablets in the morning and evening, and 1 tablet at lunchtime.  She is requesting a new prescription with new directions (2 tablets TID) so that she can get her Gabapentin filled now.

## 2018-11-27 NOTE — Progress Notes (Addendum)
Subjective:   [name] is a 58 y.o. female who presents for Medicare Annual (Subsequent) preventive examination.  [name] is accompanied today by her husband [name].  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  She enjoys listening to church sermons on television, sewing, and playing computer games.  She lives with husband, 59 year old grandson, and their 6 small dogs.  She has 5 children, 1 of her children is deceased due to being born with a congenital heart problem.  She reports no hospitalizations, ER visits, or surgeries in the past year.  She feels her health has declined over the past year due to stomach issues that she is having, and not having much energy.  Over the past year she has been receiving services from [facility] social work, case management, and pharmacy in regards to diabetes management, medication and food assistance.   Review of Systems:   All systems negative today  Cardiac Risk Factors include: diabetes mellitus;dyslipidemia;hypertension;obesity (BMI >30kg/m2);sedentary lifestyle;smoking/ tobacco exposure     Objective:     Vitals: BP 122/76   Pulse 80   Wt 293 lb (132.9 kg)   BMI 53.59 kg/m   Body mass index is 53.59 kg/m.  Advanced Directives 11/27/2018 12/08/2017 11/29/2017 08/22/2015 04/25/2015 04/22/2015 08/19/2014  Does Patient Have a Medical Advance Directive? No No No No No No No  Would patient like information on creating a medical advance directive? Yes (MAU/Ambulatory/Procedural Areas - Information given) No - Patient declined No - Patient declined No - patient declined information No - patient declined information Yes - Educational materials given -  Pre-existing out of facility DNR order (yellow form or pink MOST form) - - - - - - -    Tobacco Social History   Tobacco Use  Smoking Status Current Every Day Smoker  . Packs/day: 1.00    . Years: 35.00  . Pack years: 35.00  . Types: Cigarettes  Smokeless Tobacco Never Used     Ready to quit: No Counseling given: Yes   Clinical Intake:     Pain Score: 0-No pain                 Past Medical History:  Diagnosis Date  . Arthritis   . CAD (coronary artery disease)    BMS to circumflex 2007 - Dr. Juanda Chance  . CKD (chronic kidney disease) stage 3, GFR 30-59 ml/min (HCC)   . Essential hypertension   . Falls frequently   . GERD (gastroesophageal reflux disease)   . HA (headache)   . Hyperlipidemia   . Left-sided face pain   . Morbid obesity (HCC)   . Noncompliance   . OSA (obstructive sleep apnea)    Uses CPAP  . Type 2 diabetes mellitus (HCC)   . Urinary incontinence    Past Surgical History:  Procedure Laterality Date  . ABDOMINAL HERNIA REPAIR    . ABDOMINAL HYSTERECTOMY    . BACK SURGERY    . BREAST SURGERY     biopsy per right breast; pt states has silver piece in for marking   . Cataract surgery Bilateral   . CHOLECYSTECTOMY    . COLONOSCOPY N/A 04/10/2013   Procedure: COLONOSCOPY;  Surgeon: Malissa Hippo, MD;  Location: [facility];  Service: Endoscopy;  Laterality: N/A;  . LUMBAR LAMINECTOMY/DECOMPRESSION MICRODISCECTOMY N/A 04/25/2015   Procedure: CENTRAL  LUMBAR /DECOMPRESSION L4-L5;  Surgeon: Ranee Gosselin, MD;  Location: [facility];  Service: Orthopedics;  Laterality: N/A;  . TUBAL LIGATION     Family History  Problem Relation Age of Onset  . Coronary artery disease Father   . Cancer - Colon Father   . Diabetes Father   . High Cholesterol Father   . Breast cancer Mother   . Arthritis Sister   . Asthma Sister   . High Cholesterol Daughter   . Thyroid disease Daughter   . Hiatal hernia Son   . Congenital heart disease Son    Social History   Socioeconomic History  . Marital status: Married    Spouse name: [name]  . Number of children: 5  . Years of education: 10 th  . Highest education level: 10th grade  Occupational  History  . Occupation: disabled     Comment: disabled  Social Needs  . Financial resource strain: Somewhat hard  . Food insecurity:    Worry: Sometimes true    Inability: Sometimes true  . Transportation needs:    Medical: No    Non-medical: No  Tobacco Use  . Smoking status: Current Every Day Smoker    Packs/day: 1.00    Years: 35.00    Pack years: 35.00    Types: Cigarettes  . Smokeless tobacco: Never Used  Substance and Sexual Activity  . Alcohol use: No    Alcohol/week: 0.0 standard drinks  . Drug use: No  . Sexual activity: Not on file  Lifestyle  . Physical activity:    Days per week: 0 days    Minutes per session: 0 min  . Stress: To some extent  Relationships  . Social connections:    Talks on phone: More than three times a week    Gets together: Once a week    Attends religious service: Never    Active member of club or organization: No    Attends meetings of clubs or organizations: Never    Relationship status: Married  Other Topics Concern  . Not on file  Social History Narrative   Patient lives at home with her husband and grandchild [name]). Patient is disabled.   Patient has 10 th grade education.   Right handed.   Caffeine- one  cup of coffee and  One soda Dr.Pepper/ tea. daily    Outpatient Encounter Medications as of 11/27/2018  Medication Sig  . aspirin EC 325 MG tablet Take 325 mg by mouth every morning.   . colestipol (COLESTID) 1 g tablet Take 2 tablets (2 g total) by mouth 2 (two) times daily.  . empagliflozin (JARDIANCE) 25 MG TABS tablet Take 25 mg by mouth daily.  . furosemide (LASIX) 40 MG tablet Take 1 tablet (40 mg total) by mouth daily.  Marland Kitchen gabapentin (NEURONTIN) 300 MG capsule TAKE 2 CAPSULES BY MOUTH ONCE IN THE MORNING, AND 1 CAPSULE AT NOON, AND 2 CAPSULES ONCE IN THE EVENING  . glimepiride (AMARYL) 2 MG tablet Take 1 tablet (2 mg total) by mouth daily before breakfast.  . glucose blood (ACCU-CHEK AVIVA PLUS) test strip Use aTest  blood sugar once daily  . glucose blood (ONETOUCH VERIO) test strip Test BID and prn  e11.9  . indomethacin (INDOCIN) 25 MG capsule Take 25 mg by mouth 3 (three) times daily with meals.  Marland Kitchen lisinopril (PRINIVIL,ZESTRIL) 10 MG tablet Take 1 tablet (10 mg total) by mouth daily.  . metFORMIN (GLUCOPHAGE) 500 MG tablet Take 2 tablets (1,000 mg  total) by mouth 2 (two) times daily with a meal.  . metoprolol tartrate (LOPRESSOR) 25 MG tablet Take 1 tablet (25 mg total) by mouth 2 (two) times daily.  . nitroGLYCERIN (NITROSTAT) 0.4 MG SL tablet See AUX Label  . omeprazole (PRILOSEC) 40 MG capsule TAKE 1 CAPSULE BY MOUTH ONCE DAILY 30 MINUTES BEFORE A MEAL  . ONETOUCH DELICA LANCETS 33G MISC Check blood sugar BID and prn  E11.9  . rosuvastatin (CRESTOR) 20 MG tablet Take 1 tablet (20 mg total) by mouth at bedtime.   No facility-administered encounter medications on file as of 11/27/2018.     Activities of Daily Living In your present state of health, do you have any difficulty performing the following activities: 11/27/2018  Hearing? N  Vision? N  Difficulty concentrating or making decisions? Y  Comment Trouble remembering at times where she puts things  Walking or climbing stairs? Y  Comment Due to bilateral knee pain  Dressing or bathing? N  Doing errands, shopping? N  Preparing Food and eating ? N  Using the Toilet? N  In the past six months, have you accidently leaked urine? Y  Comment Wears pads or depends due to incontience   Do you have problems with loss of bowel control? Y  Comment Sometimes has bowel urgency, and if she does not get to the bathroom soon enough she might have an accident int he past.  Managing your Medications? N  Managing your Finances? N  Housekeeping or managing your Housekeeping? N  Some recent data might be hidden    Patient Care Team: Johna Sheriff, MD as PCP - General (Internal Medicine) Levert Feinstein, MD as Consulting Physician (Neurology) Ranee Gosselin,  MD as Consulting Physician (Orthopedic Surgery) [name], RN as [facility] Care Management Diona Browner, Illene Bolus, MD as Consulting Physician (Cardiology)    Assessment:   This is a routine wellness examination for Khiana.  Exercise Activities and Dietary recommendations  Mrs. Mateus states she usually eats one meal per day and snacks as needed.  Her main meal is supper, and her husband normally cooks this meal.  She states she is normally not hungry for breakfast and lunch.  Recommended a diet of mostly non starchy vegetables and lean proteins, and fruits and whole grains in moderation.  She states she does not like meat very much other than beef.  Recommended red meat once per week, other protein sources besides red meat were discussed such as [place] Yogurt, eggs, protein shakes, beans and nuts.  Patient states she has been worried before about whether they would run out of food.  Provided a list of food pantries in [facility], and advised she check with all of them regarding how to obtain these resources.   Current Exercise Habits: The patient does not participate in regular exercise at present, Exercise limited by: orthopedic condition(s);cardiac condition(s)  Goals    . Exercise     Increase exercise- start by walking 2 minutes around house 3 times per day, gradually increasing to a total of walking 20-30 minutes per day    . Quit smoking / using tobacco       Fall Risk Fall Risk  11/27/2018 11/22/2018 11/22/2018 10/03/2018 09/18/2018  Falls in the past year? 1 0 0 0 1  Comment - - - - -  Number falls in past yr: 0 - - - 1  Injury with Fall? 0 - - - 1  Risk Factor Category  - - - - -  Risk for fall due to : Impaired balance/gait;Impaired mobility - - - History of fall(s)  Follow up - - - - Education provided   Patient reports that she feel after tripping over an outdoor rug at the bottom of her porch stairs.  She has removed this rug.  Fall prevention and education  discussed.   Is the patient's home free of loose throw rugs in walkways, pet beds, electrical cords, etc?   yes      Grab bars in the bathroom? yes      Handrails on the stairs?   yes      Adequate lighting?   yes   Depression Screen PHQ 2/9 Scores 11/27/2018 11/23/2018 07/24/2018 06/15/2018  PHQ - 2 Score 5 5 6 5   PHQ- 9 Score 20 20 25 16   Exception Documentation - - - -  Not completed - - - -   Dr. Oswaldo Done has discussed counseling with patient, and she declined, also declined [facility].     Cognitive Function MMSE - Mini Mental State Exam 11/27/2018 11/23/2017  Orientation to time 5 5  Orientation to Place 5 5  Registration 3 3  Attention/ Calculation 5 5  Recall 3 2  Language- name 2 objects 2 2  Language- repeat 1 1  Language- follow 3 step command 3 3  Language- read & follow direction 1 1  Write a sentence 1 1  Copy design 1 1  Total score 30 29        Immunization History  Administered Date(s) Administered  . Influenza,inj,Quad PF,6+ Mos 08/10/2013, 08/28/2014, 09/25/2015, 11/23/2017, 11/27/2018  . Pneumococcal Conjugate-13 11/27/2018    Qualifies for Shingles Vaccine? Yes, declined today  Screening Tests Health Maintenance  Topic Date Due  . HIV Screening  05/10/1974  . OPHTHALMOLOGY EXAM  06/11/2017  . COLONOSCOPY  04/10/2018  . TETANUS/TDAP  06/16/2019 (Originally 05/10/1978)  . HEMOGLOBIN A1C  03/19/2019  . FOOT EXAM  05/20/2019  . MAMMOGRAM  10/17/2020  . INFLUENZA VACCINE  Completed  . PNEUMOCOCCAL POLYSACCHARIDE VACCINE AGE 21-64 HIGH RISK  Completed  . Hepatitis C Screening  Completed  . PAP SMEAR-Modifier  Discontinued   Recommend HIV screening at next visit with Dr. Oswaldo Done. Patient plans to schedule her eye exam for within the next month. Patient agrees to do Cologuard test.  Test ordered.    Patient plans to have diabetic eye exam done within the next month.  Cancer Screenings: Lung: Low Dose CT Chest recommended if Age 65-80 years, 30 pack-year  currently smoking OR have quit w/in 15years. Patient does qualify. Breast:  Up to date on Mammogram? Yes Colorectal: Cologuard ordered  Additional Screenings:  Hepatitis C Screening: completed 09/11/15     Plan:        Work on your goal of increasing exercise- start by walking 2 minutes around house 3 times per day, gradually increasing to a total of walking 20-30 minutes per day.  Review the information on Advance Directives.  If you complete the paperwork, please bring a copy to our office to file in your medical record.  Remember to schedule your diabetic eye exam.  You received your flu and pneumonia (Prevnar 13) vaccines today.  You will need the 2nd pneumonia vaccine (Pneumovax) in one year.    I have personally reviewed and noted the following in the patient's chart:   . Medical and social history . Use of alcohol, tobacco or illicit drugs  . Current medications and supplements . Functional ability and  status . Nutritional status . Physical activity . Advanced directives . List of other physicians . Hospitalizations, surgeries, and ER visits in previous 12 months . Vitals . Screenings to include cognitive, depression, and falls . Referrals and appointments  In addition, I have reviewed and discussed with patient certain preventive protocols, quality metrics, and best practice recommendations. A written personalized care plan for preventive services as well as general preventive health recommendations were provided to patient.     Aliesha Dolata M, RN  11/27/2018    I have reviewed and agree with the above AWV documentation.   [name] [name], MD [facility] 11/29/2018, 10:49 AM

## 2018-11-27 NOTE — Patient Instructions (Addendum)
Please work on your goal of increasing exercise- start by walking 2 minutes around house 3 times per day, gradually increasing to a total of walking 20-30 minutes per day.   Please review the information on Advance Directives.  If you complete the paperwork, please bring a copy to our office to file in your medical record.   Remember to schedule your diabetic eye exam.   You received your flu and pneumonia (Prevnar 13) vaccines today.  You will need the 2nd pneumonia vaccine (Pneumovax) in one year.   Thank you for coming in for your Annual Wellness Visit today!!   Preventive Care 40-64 Years, Female Preventive care refers to lifestyle choices and visits with your health care provider that can promote health and wellness. What does preventive care include?   A yearly physical exam. This is also called an annual well check.  Dental exams once or twice a year.  Routine eye exams. Ask your health care provider how often you should have your eyes checked.  Personal lifestyle choices, including: ? Daily care of your teeth and gums. ? Regular physical activity. ? Eating a healthy diet. ? Avoiding tobacco and drug use. ? Limiting alcohol use. ? Practicing safe sex. ? Taking low-dose aspirin daily starting at age 38. ? Taking vitamin and mineral supplements as recommended by your health care provider. What happens during an annual well check? The services and screenings done by your health care provider during your annual well check will depend on your age, overall health, lifestyle risk factors, and family history of disease. Counseling Your health care provider may ask you questions about your:  Alcohol use.  Tobacco use.  Drug use.  Emotional well-being.  Home and relationship well-being.  Sexual activity.  Eating habits.  Work and work Statistician.  Method of birth control.  Menstrual cycle.  Pregnancy history. Screening You may have the following tests or  measurements:  Height, weight, and BMI.  Blood pressure.  Lipid and cholesterol levels. These may be checked every 5 years, or more frequently if you are over 52 years old.  Skin check.  Lung cancer screening. You may have this screening every year starting at age 71 if you have a 30-pack-year history of smoking and currently smoke or have quit within the past 15 years.  Colorectal cancer screening. All adults should have this screening starting at age 40 and continuing until age 83. Your health care provider may recommend screening at age 87. You will have tests every 1-10 years, depending on your results and the type of screening test. People at increased risk should start screening at an earlier age. Screening tests may include: ? Guaiac-based fecal occult blood testing. ? Fecal immunochemical test (FIT). ? Stool DNA test. ? Virtual colonoscopy. ? Sigmoidoscopy. During this test, a flexible tube with a tiny camera (sigmoidoscope) is used to examine your rectum and lower colon. The sigmoidoscope is inserted through your anus into your rectum and lower colon. ? Colonoscopy. During this test, a long, thin, flexible tube with a tiny camera (colonoscope) is used to examine your entire colon and rectum.  Hepatitis C blood test.  Hepatitis B blood test.  Sexually transmitted disease (STD) testing.  Diabetes screening. This is done by checking your blood sugar (glucose) after you have not eaten for a while (fasting). You may have this done every 1-3 years.  Mammogram. This may be done every 1-2 years. Talk to your health care provider about when you should start having  regular mammograms. This may depend on whether you have a family history of breast cancer.  BRCA-related cancer screening. This may be done if you have a family history of breast, ovarian, tubal, or peritoneal cancers.  Pelvic exam and Pap test. This may be done every 3 years starting at age 24. Starting at age 68, this may  be done every 5 years if you have a Pap test in combination with an HPV test.  Bone density scan. This is done to screen for osteoporosis. You may have this scan if you are at high risk for osteoporosis. Discuss your test results, treatment options, and if necessary, the need for more tests with your health care provider. Vaccines Your health care provider may recommend certain vaccines, such as:  Influenza vaccine. This is recommended every year.  Tetanus, diphtheria, and acellular pertussis (Tdap, Td) vaccine. You may need a Td booster every 10 years.  Varicella vaccine. You may need this if you have not been vaccinated.  Zoster vaccine. You may need this after age 25.  Measles, mumps, and rubella (MMR) vaccine. You may need at least one dose of MMR if you were born in 1957 or later. You may also need a second dose.  Pneumococcal 13-valent conjugate (PCV13) vaccine. You may need this if you have certain conditions and were not previously vaccinated.  Pneumococcal polysaccharide (PPSV23) vaccine. You may need one or two doses if you smoke cigarettes or if you have certain conditions.  Meningococcal vaccine. You may need this if you have certain conditions.  Hepatitis A vaccine. You may need this if you have certain conditions or if you travel or work in places where you may be exposed to hepatitis A.  Hepatitis B vaccine. You may need this if you have certain conditions or if you travel or work in places where you may be exposed to hepatitis B.  Haemophilus influenzae type b (Hib) vaccine. You may need this if you have certain conditions. Talk to your health care provider about which screenings and vaccines you need and how often you need them. This information is not intended to replace advice given to you by your health care provider. Make sure you discuss any questions you have with your health care provider. Document Released: 11/21/2015 Document Revised: 12/15/2017 Document  Reviewed: 08/26/2015 Elsevier Interactive Patient Education  2019 [place].   Diabetes Mellitus and Nutrition, Adult When you have diabetes (diabetes mellitus), it is very important to have healthy eating habits because your blood sugar (glucose) levels are greatly affected by what you eat and drink. Eating healthy foods in the appropriate amounts, at about the same times every day, can help you:  Control your blood glucose.  Lower your risk of heart disease.  Improve your blood pressure.  Reach or maintain a healthy weight. Every person with diabetes is different, and each person has different needs for a meal plan. Your health care provider may recommend that you work with a diet and nutrition specialist (dietitian) to make a meal plan that is best for you. Your meal plan may vary depending on factors such as:  The calories you need.  The medicines you take.  Your weight.  Your blood glucose, blood pressure, and cholesterol levels.  Your activity level.  Other health conditions you have, such as heart or kidney disease. How do carbohydrates affect me? Carbohydrates, also called carbs, affect your blood glucose level more than any other type of food. Eating carbs naturally raises the amount  of glucose in your blood. Carb counting is a method for keeping track of how many carbs you eat. Counting carbs is important to keep your blood glucose at a healthy level, especially if you use insulin or take certain oral diabetes medicines. It is important to know how many carbs you can safely have in each meal. This is different for every person. Your dietitian can help you calculate how many carbs you should have at each meal and for each snack. Foods that contain carbs include:  Bread, cereal, rice, pasta, and crackers.  Potatoes and corn.  Peas, beans, and lentils.  Milk and yogurt.  Fruit and juice.  Desserts, such as cakes, cookies, ice cream, and candy. How does alcohol  affect me? Alcohol can cause a sudden decrease in blood glucose (hypoglycemia), especially if you use insulin or take certain oral diabetes medicines. Hypoglycemia can be a life-threatening condition. Symptoms of hypoglycemia (sleepiness, dizziness, and confusion) are similar to symptoms of having too much alcohol. If your health care provider says that alcohol is safe for you, follow these guidelines:  Limit alcohol intake to no more than 1 drink per day for nonpregnant women and 2 drinks per day for men. One drink equals 12 oz of beer, 5 oz of wine, or 1 oz of hard liquor.  Do not drink on an empty stomach.  Keep yourself hydrated with water, diet soda, or unsweetened iced tea.  Keep in mind that regular soda, juice, and other mixers may contain a lot of sugar and must be counted as carbs. What are tips for following this plan?  Reading food labels  Start by checking the serving size on the "Nutrition Facts" label of packaged foods and drinks. The amount of calories, carbs, fats, and other nutrients listed on the label is based on one serving of the item. Many items contain more than one serving per package.  Check the total grams (g) of carbs in one serving. You can calculate the number of servings of carbs in one serving by dividing the total carbs by 15. For example, if a food has 30 g of total carbs, it would be equal to 2 servings of carbs.  Check the number of grams (g) of saturated and trans fats in one serving. Choose foods that have low or no amount of these fats.  Check the number of milligrams (mg) of salt (sodium) in one serving. Most people should limit total sodium intake to less than 2,300 mg per day.  Always check the nutrition information of foods labeled as "low-fat" or "nonfat". These foods may be higher in added sugar or refined carbs and should be avoided.  Talk to your dietitian to identify your daily goals for nutrients listed on the label. Shopping  Avoid buying  canned, premade, or processed foods. These foods tend to be high in fat, sodium, and added sugar.  Shop around the outside edge of the grocery store. This includes fresh fruits and vegetables, bulk grains, fresh meats, and fresh dairy. Cooking  Use low-heat cooking methods, such as baking, instead of high-heat cooking methods like deep frying.  Cook using healthy oils, such as olive, canola, or sunflower oil.  Avoid cooking with butter, cream, or high-fat meats. Meal planning  Eat meals and snacks regularly, preferably at the same times every day. Avoid going long periods of time without eating.  Eat foods high in fiber, such as fresh fruits, vegetables, beans, and whole grains. Talk to your dietitian about how  many servings of carbs you can eat at each meal.  Eat 4-6 ounces (oz) of lean protein each day, such as lean meat, chicken, fish, eggs, or tofu. One oz of lean protein is equal to: ? 1 oz of meat, chicken, or fish. ? 1 egg. ?  cup of tofu.  Eat some foods each day that contain healthy fats, such as avocado, nuts, seeds, and fish. Lifestyle  Check your blood glucose regularly.  Exercise regularly as told by your health care provider. This may include: ? 150 minutes of moderate-intensity or vigorous-intensity exercise each week. This could be brisk walking, biking, or water aerobics. ? Stretching and doing strength exercises, such as yoga or weightlifting, at least 2 times a week.  Take medicines as told by your health care provider.  Do not use any products that contain nicotine or tobacco, such as cigarettes and e-cigarettes. If you need help quitting, ask your health care provider.  Work with a Social worker or diabetes educator to identify strategies to manage stress and any emotional and social challenges. Questions to ask a health care provider  Do I need to meet with a diabetes educator?  Do I need to meet with a dietitian?  What number can I call if I have  questions?  When are the best times to check my blood glucose? Where to find more information:  American Diabetes Association: diabetes.org  Academy of Nutrition and Dietetics: www.eatright.[employer] of Diabetes and Digestive and Kidney Diseases (NIH): [employer] Summary  A healthy meal plan will help you control your blood glucose and maintain a healthy lifestyle.  Working with a diet and nutrition specialist (dietitian) can help you make a meal plan that is best for you.  Keep in mind that carbohydrates (carbs) and alcohol have immediate effects on your blood glucose levels. It is important to count carbs and to use alcohol carefully. This information is not intended to replace advice given to you by your health care provider. Make sure you discuss any questions you have with your health care provider. Document Released: 07/22/2005 Document Revised: 05/25/2017 Document Reviewed: 11/29/2016 Elsevier Interactive Patient Education  2019 [place] Prevention in the Home, Adult Falls can cause injuries. They can happen to people of all ages. There are many things you can do to make your home safe and to help prevent falls. Ask for help when making these changes, if needed. What actions can I take to prevent falls? General Instructions  Use good lighting in all rooms. Replace any light bulbs that burn out.  Turn on the lights when you go into a dark area. Use night-lights.  Keep items that you use often in easy-to-reach places. Lower the shelves around your home if necessary.  Set up your furniture so you have a clear path. Avoid moving your furniture around.  Do not have throw rugs and other things on the floor that can make you trip.  Avoid walking on wet floors.  If any of your floors are uneven, fix them.  Add color or contrast paint or tape to clearly mark and help you see: ? Any grab bars or handrails. ? First and last steps of  stairways. ? Where the edge of each step is.  If you use a stepladder: ? Make sure that it is fully opened. Do not climb a closed stepladder. ? Make sure that both sides of the stepladder are locked into place. ? Ask someone to hold the stepladder  for you while you use it.  If there are any pets around you, be aware of where they are. What can I do in the bathroom?      Keep the floor dry. Clean up any water that spills onto the floor as soon as it happens.  Remove soap buildup in the tub or shower regularly.  Use non-skid mats or decals on the floor of the tub or shower.  Attach bath mats securely with double-sided, non-slip rug tape.  If you need to sit down in the shower, use a plastic, non-slip stool.  Install grab bars by the toilet and in the tub and shower. Do not use towel bars as grab bars. What can I do in the bedroom?  Make sure that you have a light by your bed that is easy to reach.  Do not use any sheets or blankets that are too big for your bed. They should not hang down onto the floor.  Have a firm chair that has side arms. You can use this for support while you get dressed. What can I do in the kitchen?  Clean up any spills right away.  If you need to reach something above you, use a strong step stool that has a grab bar.  Keep electrical cords out of the way.  Do not use floor polish or wax that makes floors slippery. If you must use wax, use non-skid floor wax. What can I do with my stairs?  Do not leave any items on the stairs.  Make sure that you have a light switch at the top of the stairs and the bottom of the stairs. If you do not have them, ask someone to add them for you.  Make sure that there are handrails on both sides of the stairs, and use them. Fix handrails that are broken or loose. Make sure that handrails are as long as the stairways.  Install non-slip stair treads on all stairs in your home.  Avoid having throw rugs at the top or  bottom of the stairs. If you do have throw rugs, attach them to the floor with carpet tape.  Choose a carpet that does not hide the edge of the steps on the stairway.  Check any carpeting to make sure that it is firmly attached to the stairs. Fix any carpet that is loose or worn. What can I do on the outside of my home?  Use bright outdoor lighting.  Regularly fix the edges of walkways and driveways and fix any cracks.  Remove anything that might make you trip as you walk through a door, such as a raised step or threshold.  Trim any bushes or trees on the path to your home.  Regularly check to see if handrails are loose or broken. Make sure that both sides of any steps have handrails.  Install guardrails along the edges of any raised decks and porches.  Clear walking paths of anything that might make someone trip, such as tools or rocks.  Have any leaves, snow, or ice cleared regularly.  Use sand or salt on walking paths during winter.  Clean up any spills in your garage right away. This includes grease or oil spills. What other actions can I take?  Wear shoes that: ? Have a low heel. Do not wear high heels. ? Have rubber bottoms. ? Are comfortable and fit you well. ? Are closed at the toe. Do not wear open-toe sandals.  Use tools that help  you move around (mobility aids) if they are needed. These include: ? Canes. ? Walkers. ? Scooters. ? Crutches.  Review your medicines with your doctor. Some medicines can make you feel dizzy. This can increase your chance of falling. Ask your doctor what other things you can do to help prevent falls. Where to find more information  Centers for Disease Control and Prevention, STEADI: https://garcia.biz/  [employer] on Aging: BrainJudge.co.uk Contact a doctor if:  You are afraid of falling at home.  You feel weak, drowsy, or dizzy at home.  You fall at home. Summary  There are many simple things that you can do  to make your home safe and to help prevent falls.  Ways to make your home safe include removing tripping hazards and installing grab bars in the bathroom.  Ask for help when making these changes in your home. This information is not intended to replace advice given to you by your health care provider. Make sure you discuss any questions you have with your health care provider. Document Released: 08/21/2009 Document Revised: 06/09/2017 Document Reviewed: 06/09/2017 Elsevier Interactive Patient Education  2019 [employer].

## 2018-11-27 NOTE — Progress Notes (Signed)
I sent in refill with correct Rx, should be enough now for 90 days

## 2018-12-04 ENCOUNTER — Telehealth: Payer: Self-pay

## 2018-12-07 ENCOUNTER — Telehealth: Payer: Self-pay

## 2018-12-13 ENCOUNTER — Other Ambulatory Visit: Payer: Self-pay | Admitting: Family Medicine

## 2018-12-13 ENCOUNTER — Telehealth: Payer: Self-pay | Admitting: Family Medicine

## 2018-12-13 MED ORDER — FLUCONAZOLE 150 MG PO TABS: 150.0000 mg | ORAL_TABLET | Freq: Once | ORAL | 0 refills | Status: AC

## 2018-12-13 NOTE — Telephone Encounter (Signed)
I will send rx in this one time since she is transitioning PCPs. However, please inform that in the future if antibiotics/ antifungals are needed, she will have to be seen.

## 2018-12-14 NOTE — Telephone Encounter (Signed)
Pt aware of MD feedback and voiced understanding. 

## 2018-12-18 ENCOUNTER — Telehealth: Payer: PPO

## 2018-12-25 LAB — HM DIABETES EYE EXAM

## 2018-12-27 ENCOUNTER — Ambulatory Visit: Payer: Self-pay

## 2018-12-28 ENCOUNTER — Telehealth: Payer: PPO

## 2018-12-29 ENCOUNTER — Ambulatory Visit: Payer: PPO | Admitting: Family Medicine

## 2018-12-29 ENCOUNTER — Ambulatory Visit: Payer: PPO | Admitting: Pediatrics

## 2019-01-01 ENCOUNTER — Encounter: Payer: Self-pay | Admitting: Family Medicine

## 2019-01-01 ENCOUNTER — Other Ambulatory Visit: Payer: Self-pay | Admitting: *Deleted

## 2019-01-01 ENCOUNTER — Ambulatory Visit: Payer: PPO | Admitting: Family Medicine

## 2019-01-01 ENCOUNTER — Ambulatory Visit: Payer: Self-pay

## 2019-01-01 NOTE — Patient Outreach (Signed)
[facility]) Care Management  01/01/2019   [name] 05/25/1961 [id number]  Stacey Maidens RN Health Coach telephone call to patient for Stacey Fairly RN.  [employer] compliance verified. Per patient she is having nausea and diarrhea. Patient stated that she feels like she has the flu. Patient has not called or followed up with physician. Per patient she has not checked her blood sugars today. Patient stated that she has an appointment on Friday. RN discussed with patient about calling and making physician aware because they may be able to see her earlier and may order her a prescription of Tamiflu that you need to take within so many days. Patient has already had the symptoms for 2 days. RN discussed with patient about checking her blood sugars and how her body will need carbohydrates . If she can not eat she needs to drink liquids with sugar or carbohydrates such ar soda or fruit juices, Continue to drink water to stay hydrated. Per patient she had also fallen over a rug in the house and hurt her knee. RN discussed fall precautions.Patient has agreed to further outreach calls.   Current Medications:  Current Outpatient Medications  Medication Sig Dispense Refill  . aspirin EC 325 MG tablet Take 325 mg by mouth every morning.     . colestipol (COLESTID) 1 g tablet Take 2 tablets (2 g total) by mouth 2 (two) times daily. 120 tablet 4  . empagliflozin (JARDIANCE) 25 MG TABS tablet Take 25 mg by mouth daily. 90 tablet 1  . furosemide (LASIX) 40 MG tablet Take 1 tablet (40 mg total) by mouth daily. 90 tablet 1  . gabapentin (NEURONTIN) 300 MG capsule TAKE 2 CAPSULES BY MOUTH ONCE IN THE MORNING, AND 2 CAPSULE AT NOON, AND 2 CAPSULES ONCE IN THE EVENING 540 capsule 1  . glimepiride (AMARYL) 2 MG tablet Take 1 tablet (2 mg total) by mouth daily before breakfast. 30 tablet 3  . glucose blood (ACCU-CHEK AVIVA PLUS) test strip Use aTest blood sugar once daily 100 each 2  . glucose blood  (ONETOUCH VERIO) test strip Test BID and prn  e11.9 100 each 11  . indomethacin (INDOCIN) 25 MG capsule Take 25 mg by mouth 3 (three) times daily with meals.    Marland Kitchen lisinopril (PRINIVIL,ZESTRIL) 10 MG tablet Take 1 tablet (10 mg total) by mouth daily. 90 tablet 3  . metFORMIN (GLUCOPHAGE) 500 MG tablet Take 2 tablets (1,000 mg total) by mouth 2 (two) times daily with a meal. 360 tablet 1  . metoprolol tartrate (LOPRESSOR) 25 MG tablet Take 1 tablet (25 mg total) by mouth 2 (two) times daily. 180 tablet 3  . nitroGLYCERIN (NITROSTAT) 0.4 MG SL tablet See AUX Label 12 tablet 0  . omeprazole (PRILOSEC) 40 MG capsule TAKE 1 CAPSULE BY MOUTH ONCE DAILY 30 MINUTES BEFORE A MEAL 90 capsule 3  . ONETOUCH DELICA LANCETS 33G MISC Check blood sugar BID and prn  E11.9 100 each 11  . rosuvastatin (CRESTOR) 20 MG tablet Take 1 tablet (20 mg total) by mouth at bedtime. 90 tablet 3   No current facility-administered medications for this visit.     Functional Status:  In your present state of health, do you have any difficulty performing the following activities: 11/27/2018  Hearing? N  Vision? N  Difficulty concentrating or making decisions? Y  Comment Trouble remembering at times where she puts things  Walking or climbing stairs? Y  Comment Due to bilateral knee pain  Dressing or bathing? N  Doing errands, shopping? N  Preparing Food and eating ? N  Using the Toilet? N  In the past six months, have you accidently leaked urine? Y  Comment Wears pads or depends due to incontience   Do you have problems with loss of bowel control? Y  Comment Sometimes has bowel urgency, and if she does not get to the bathroom soon enough she might have an accident int he past.  Managing your Medications? N  Managing your Finances? N  Housekeeping or managing your Housekeeping? N  Some recent data might be hidden    Fall/Depression Screening: Fall Risk  01/01/2019 11/27/2018 11/22/2018  Falls in the past year? 1 1 0   Comment - - -  Number falls in past yr: 0 0 -  Injury with Fall? 1 0 -  Comment Per patient she hurt her knee - -  Risk Factor Category  - - -  Risk for fall due to : History of fall(s);Impaired balance/gait;Impaired mobility Impaired balance/gait;Impaired mobility -  Follow up Falls evaluation completed;Falls prevention discussed - -   PHQ 2/9 Scores 11/27/2018 11/23/2018 07/24/2018 06/15/2018 05/19/2018 04/12/2018 02/20/2018  PHQ - 2 Score 5 5 6 5  0 0 2  PHQ- 9 Score 20 20 25 16  - - 12  Exception Documentation - - - - - - -  Not completed - - - - - - -     Plan: RN sent Educational material on fall precaution RN sent educational material on Diabetic sick days RN Health Coach will follow up within the month of March.   [name] BSN RN  [facility] Care Management [phone]

## 2019-01-05 ENCOUNTER — Encounter: Payer: Self-pay | Admitting: Family Medicine

## 2019-01-05 ENCOUNTER — Ambulatory Visit (INDEPENDENT_AMBULATORY_CARE_PROVIDER_SITE_OTHER): Payer: PPO | Admitting: Family Medicine

## 2019-01-05 ENCOUNTER — Ambulatory Visit (INDEPENDENT_AMBULATORY_CARE_PROVIDER_SITE_OTHER): Payer: PPO

## 2019-01-05 ENCOUNTER — Ambulatory Visit: Payer: PPO | Admitting: Family Medicine

## 2019-01-05 VITALS — BP 128/76 | HR 73 | Temp 97.6°F | Ht 62.0 in | Wt 300.0 lb

## 2019-01-05 DIAGNOSIS — F17200 Nicotine dependence, unspecified, uncomplicated: Secondary | ICD-10-CM

## 2019-01-05 DIAGNOSIS — I10 Essential (primary) hypertension: Secondary | ICD-10-CM | POA: Diagnosis not present

## 2019-01-05 DIAGNOSIS — E1169 Type 2 diabetes mellitus with other specified complication: Secondary | ICD-10-CM | POA: Diagnosis not present

## 2019-01-05 DIAGNOSIS — E785 Hyperlipidemia, unspecified: Secondary | ICD-10-CM

## 2019-01-05 DIAGNOSIS — R0600 Dyspnea, unspecified: Secondary | ICD-10-CM

## 2019-01-05 DIAGNOSIS — K915 Postcholecystectomy syndrome: Secondary | ICD-10-CM

## 2019-01-05 DIAGNOSIS — E113393 Type 2 diabetes mellitus with moderate nonproliferative diabetic retinopathy without macular edema, bilateral: Secondary | ICD-10-CM | POA: Diagnosis not present

## 2019-01-05 DIAGNOSIS — E1142 Type 2 diabetes mellitus with diabetic polyneuropathy: Secondary | ICD-10-CM

## 2019-01-05 DIAGNOSIS — E1159 Type 2 diabetes mellitus with other circulatory complications: Secondary | ICD-10-CM

## 2019-01-05 DIAGNOSIS — R0602 Shortness of breath: Secondary | ICD-10-CM | POA: Diagnosis not present

## 2019-01-05 DIAGNOSIS — R0609 Other forms of dyspnea: Secondary | ICD-10-CM

## 2019-01-05 DIAGNOSIS — I152 Hypertension secondary to endocrine disorders: Secondary | ICD-10-CM

## 2019-01-05 LAB — BAYER DCA HB A1C WAIVED: HB A1C (BAYER DCA - WAIVED): 7.4 % — ABNORMAL HIGH (ref <7.0)

## 2019-01-05 MED ORDER — LISINOPRIL 10 MG PO TABS: 10.0000 mg | ORAL_TABLET | Freq: Every day | ORAL | 3 refills | Status: DC

## 2019-01-05 MED ORDER — METFORMIN HCL 500 MG PO TABS: 1000.0000 mg | ORAL_TABLET | Freq: Two times a day (BID) | ORAL | 1 refills | Status: DC

## 2019-01-05 MED ORDER — FUROSEMIDE 40 MG PO TABS: 40.0000 mg | ORAL_TABLET | Freq: Every day | ORAL | 1 refills | Status: DC | PRN

## 2019-01-05 MED ORDER — METOPROLOL TARTRATE 25 MG PO TABS: 25.0000 mg | ORAL_TABLET | Freq: Two times a day (BID) | ORAL | 3 refills | Status: DC

## 2019-01-05 MED ORDER — FUROSEMIDE 40 MG PO TABS: 40.0000 mg | ORAL_TABLET | Freq: Every day | ORAL | 1 refills | Status: DC

## 2019-01-05 MED ORDER — COLESTIPOL HCL 1 G PO TABS: 2.0000 g | ORAL_TABLET | Freq: Two times a day (BID) | ORAL | 4 refills | Status: DC

## 2019-01-05 MED ORDER — ALBUTEROL SULFATE HFA 108 (90 BASE) MCG/ACT IN AERS: 2.0000 | INHALATION_SPRAY | Freq: Four times a day (QID) | RESPIRATORY_TRACT | 0 refills | Status: DC | PRN

## 2019-01-05 MED ORDER — GLIMEPIRIDE 2 MG PO TABS: 2.0000 mg | ORAL_TABLET | Freq: Every day | ORAL | 3 refills | Status: DC

## 2019-01-05 MED ORDER — EMPAGLIFLOZIN 25 MG PO TABS: 25.0000 mg | ORAL_TABLET | Freq: Every day | ORAL | 1 refills | Status: DC

## 2019-01-05 MED ORDER — ROSUVASTATIN CALCIUM 20 MG PO TABS: 20.0000 mg | ORAL_TABLET | Freq: Every day | ORAL | 3 refills | Status: DC

## 2019-01-05 NOTE — Patient Instructions (Signed)
We talked about smoking for quite a while today.  Congratulations on taking the first step in stopping by getting the patches.  I offered medication today for stress/ anxiety but you did not want to start this.  That's ok.  It's still an option if you find that the stress continues to trigger smoking.  I will call you with the results of your chest xray.   How to Use a Metered Dose Inhaler A metered dose inhaler is a handheld device for taking medicine that must be breathed into the lungs (inhaled). The device can be used to deliver a variety of inhaled medicines, including:  Quick relief or rescue medicines, such as bronchodilators.  Controller medicines, such as corticosteroids. The medicine is delivered by pushing down on a metal canister to release a preset amount of spray and medicine. Each device contains the amount of medicine that is needed for a preset number of uses (inhalations). Your health care provider may recommend that you use a spacer with your inhaler to help you take the medicine more effectively. A spacer is a plastic tube with a mouthpiece on one end and an opening that connects to the inhaler on the other end. A spacer holds the medicine in a tube for a short time, which allows you to inhale more medicine. What are the risks? If you do not use your inhaler correctly, medicine might not reach your lungs to help you breathe. Inhaler medicine can cause side effects, such as:  Mouth or throat infection.  Cough.  Hoarseness.  Headache.  Nausea and vomiting.  Lung infection (pneumonia) in people who have a lung condition called COPD. How to use a metered dose inhaler without a spacer  1. Remove the cap from the inhaler. 2. If you are using the inhaler for the first time, shake it for 5 seconds, turn it away from your face, then release 4 puffs into the air. This is called priming. 3. Shake the inhaler for 5 seconds. 4. Position the inhaler so the top of the canister  faces up. 5. Put your index finger on the top of the medicine canister. Support the bottom of the inhaler with your thumb. 6. Breathe out normally and as completely as possible, away from the inhaler. 7. Either place the inhaler between your teeth and close your lips tightly around the mouthpiece, or hold the inhaler 1-2 inches (2.5-5 cm) away from your open mouth. Keep your tongue down out of the way. If you are unsure which technique to use, ask your health care provider. 8. Press the canister down with your index finger to release the medicine, then inhale deeply and slowly through your mouth (not your nose) until your lungs are completely filled. Inhaling should take 4-6 seconds. 9. Hold the medicine in your lungs for 5-10 seconds (10 seconds is best). This helps the medicine get into the small airways of your lungs. 10. With your lips in a tight circle (pursed), breathe out slowly. 11. Repeat steps 3-10 until you have taken the number of puffs that your health care provider directed. Wait about 1 minute between puffs or as directed. 12. Put the cap on the inhaler. 13. If you are using a steroid inhaler, rinse your mouth with water, gargle, and spit out the water. Do not swallow the water. How to use a metered dose inhaler with a spacer  1. Remove the cap from the inhaler. 2. If you are using the inhaler for the first time, shake  it for 5 seconds, turn it away from your face, then release 4 puffs into the air. This is called priming. 3. Shake the inhaler for 5 seconds. 4. Place the open end of the spacer onto the inhaler mouthpiece. 5. Position the inhaler so the top of the canister faces up and the spacer mouthpiece faces you. 6. Put your index finger on the top of the medicine canister. Support the bottom of the inhaler and the spacer with your thumb. 7. Breathe out normally and as completely as possible, away from the spacer. 8. Place the spacer between your teeth and close your lips  tightly around it. Keep your tongue down out of the way. 9. Press the canister down with your index finger to release the medicine, then inhale deeply and slowly through your mouth (not your nose) until your lungs are completely filled. Inhaling should take 4-6 seconds. 10. Hold the medicine in your lungs for 5-10 seconds (10 seconds is best). This helps the medicine get into the small airways of your lungs. 11. With your lips in a tight circle (pursed), breathe out slowly. 12. Repeat steps 3-11 until you have taken the number of puffs that your health care provider directed. Wait about 1 minute between puffs or as directed. 13. Remove the spacer from the inhaler and put the cap on the inhaler. 14. If you are using a steroid inhaler, rinse your mouth with water, gargle, and spit out the water. Do not swallow the water. Follow these instructions at home:  Take your inhaled medicine only as told by your health care provider. Do not use the inhaler more than directed by your health care provider.  Keep all follow-up visits as told by your health care provider. This is important.  If your inhaler has a counter, you can check it to determine how full your inhaler is. If your inhaler does not have a counter, ask your health care provider when you will need to refill your inhaler and write the refill date on a calendar or on your inhaler canister. Note that you cannot know when an inhaler is empty by shaking it.  Follow directions on the package insert for care and cleaning of your inhaler and spacer. Contact a health care provider if:  Symptoms are only partially relieved with your inhaler.  You are having trouble using your inhaler.  You have an increase in phlegm.  You have headaches. Get help right away if:  You feel little or no relief after using your inhaler.  You have dizziness.  You have a fast heart rate.  You have chills or a fever.  You have night sweats.  There is blood in  your phlegm. Summary  A metered dose inhaler is a handheld device for taking medicine that must be breathed into the lungs (inhaled).  The medicine is delivered by pushing down on a metal canister to release a preset amount of spray and medicine.  Each device contains the amount of medicine that is needed for a preset number of uses (inhalations). This information is not intended to replace advice given to you by your health care provider. Make sure you discuss any questions you have with your health care provider. Document Released: 10/25/2005 Document Revised: 05/16/2017 Document Reviewed: 09/14/2016 Elsevier Interactive Patient Education  2019 [employer].

## 2019-01-05 NOTE — Progress Notes (Signed)
Subjective: CC: DM2, HTN, HLD PCP: [name], DO [name] is a 58 y.o. female presenting to clinic today for:  1. Type 2 Diabetes with HTN, HLD and diabetic retinopath:  Patient reports High at home: 200s; Low at home: 150s, Taking medication(s): Metformin 1000mg  BID, Jardiance 25mg  daily, Amaryl 2mg  daily, Crestor 20mg  daily, Lisinopril 10mg  daily, Metoprolol 25mg  BID, Side effects: None.  She would like to have samples of the Jardiance if able.  Last eye exam: 12/25/2018 (+moderate nonproliferative diabetic retinopathy without macular edema bilaterally) Last foot exam: 05/2018 Last A1c:  Lab Results  Component Value Date   HGBA1C 7.9 (H) 09/18/2018   Nephropathy screen indicated?: on Lisinopril 10mg  Last flu, zoster and/or pneumovax:  Immunization History  Administered Date(s) Administered  . Influenza,inj,Quad PF,6+ Mos 08/10/2013, 08/28/2014, 09/25/2015, 11/23/2017, 11/27/2018  . Pneumococcal Conjugate-13 11/27/2018    ROS: Denies LOC, polyuria, polydipsia, foot ulcerations, chest pain.  She has been experiencing some dyspnea on exertion over the last several days.  She is seen some weight gain since she started on a medicine, which she thinks is indomethacin, by her orthopedist.  She does not report significant lower extremity edema.  She has not been on the Lasix for a while but would like refills of this.  2. Tobacco use disorder She continues to smoke 1 pack/day.  She has been a smoker for 46 years.  She reports dyspnea on exertion as above.  Denies any shortness of breath at rest, lower extremity edema, hemoptysis, fevers.  She was able to quit smoking for several weeks before when she was hospitalized.  She thinks she has COPD but denies having ever had formal lung testing or evaluation by pulmonology.  She is not on any inhalers.   ROS: Per HPI  Allergies  Allergen Reactions  . Morphine And Related Nausea And Vomiting   Past Medical History:    Diagnosis Date  . Arthritis   . CAD (coronary artery disease)    BMS to circumflex 2007 - Dr. Juanda Chance  . CKD (chronic kidney disease) stage 3, GFR 30-59 ml/min (HCC)   . Essential hypertension   . Falls frequently   . GERD (gastroesophageal reflux disease)   . HA (headache)   . Hyperlipidemia   . Left-sided face pain   . Morbid obesity (HCC)   . Noncompliance   . OSA (obstructive sleep apnea)    Uses CPAP  . Type 2 diabetes mellitus (HCC)   . Urinary incontinence     Current Outpatient Medications:  .  aspirin EC 325 MG tablet, Take 325 mg by mouth every morning. , Disp: , Rfl:  .  colestipol (COLESTID) 1 g tablet, Take 2 tablets (2 g total) by mouth 2 (two) times daily., Disp: 120 tablet, Rfl: 4 .  empagliflozin (JARDIANCE) 25 MG TABS tablet, Take 25 mg by mouth daily., Disp: 90 tablet, Rfl: 1 .  furosemide (LASIX) 40 MG tablet, Take 1 tablet (40 mg total) by mouth daily., Disp: 90 tablet, Rfl: 1 .  gabapentin (NEURONTIN) 300 MG capsule, TAKE 2 CAPSULES BY MOUTH ONCE IN THE MORNING, AND 2 CAPSULE AT NOON, AND 2 CAPSULES ONCE IN THE EVENING, Disp: 540 capsule, Rfl: 1 .  glimepiride (AMARYL) 2 MG tablet, Take 1 tablet (2 mg total) by mouth daily before breakfast., Disp: 30 tablet, Rfl: 3 .  glucose blood (ACCU-CHEK AVIVA PLUS) test strip, Use aTest blood sugar once daily, Disp: 100 each, Rfl: 2 .  glucose blood (ONETOUCH  VERIO) test strip, Test BID and prn  e11.9, Disp: 100 each, Rfl: 11 .  indomethacin (INDOCIN) 25 MG capsule, Take 25 mg by mouth 3 (three) times daily with meals., Disp: , Rfl:  .  lisinopril (PRINIVIL,ZESTRIL) 10 MG tablet, Take 1 tablet (10 mg total) by mouth daily., Disp: 90 tablet, Rfl: 3 .  metFORMIN (GLUCOPHAGE) 500 MG tablet, Take 2 tablets (1,000 mg total) by mouth 2 (two) times daily with a meal., Disp: 360 tablet, Rfl: 1 .  metoprolol tartrate (LOPRESSOR) 25 MG tablet, Take 1 tablet (25 mg total) by mouth 2 (two) times daily., Disp: 180 tablet, Rfl: 3 .   nitroGLYCERIN (NITROSTAT) 0.4 MG SL tablet, See AUX Label, Disp: 12 tablet, Rfl: 0 .  omeprazole (PRILOSEC) 40 MG capsule, TAKE 1 CAPSULE BY MOUTH ONCE DAILY 30 MINUTES BEFORE A MEAL, Disp: 90 capsule, Rfl: 3 .  ONETOUCH DELICA LANCETS 33G MISC, Check blood sugar BID and prn  E11.9, Disp: 100 each, Rfl: 11 .  rosuvastatin (CRESTOR) 20 MG tablet, Take 1 tablet (20 mg total) by mouth at bedtime., Disp: 90 tablet, Rfl: 3 Social History   Socioeconomic History  . Marital status: Married    Spouse name: [name]  . Number of children: 5  . Years of education: 10 th  . Highest education level: 10th grade  Occupational History  . Occupation: disabled     Comment: disabled  Social Needs  . Financial resource strain: Somewhat hard  . Food insecurity:    Worry: Sometimes true    Inability: Sometimes true  . Transportation needs:    Medical: No    Non-medical: No  Tobacco Use  . Smoking status: Current Every Day Smoker    Packs/day: 1.00    Years: 35.00    Pack years: 35.00    Types: Cigarettes  . Smokeless tobacco: Never Used  Substance and Sexual Activity  . Alcohol use: No    Alcohol/week: 0.0 standard drinks  . Drug use: No  . Sexual activity: Not on file  Lifestyle  . Physical activity:    Days per week: 0 days    Minutes per session: 0 min  . Stress: To some extent  Relationships  . Social connections:    Talks on phone: More than three times a week    Gets together: Once a week    Attends religious service: Never    Active member of club or organization: No    Attends meetings of clubs or organizations: Never    Relationship status: Married  . Intimate partner violence:    Fear of current or ex partner: No    Emotionally abused: No    Physically abused: No    Forced sexual activity: No  Other Topics Concern  . Not on file  Social History Narrative   Patient lives at home with her husband and grandchild [name]). Patient is disabled.   Patient has 10 th grade  education.   Right handed.   Caffeine- one  cup of coffee and  One soda Dr.Pepper/ tea. daily   Family History  Problem Relation Age of Onset  . Coronary artery disease Father   . Cancer - Colon Father   . Diabetes Father   . High Cholesterol Father   . Breast cancer Mother   . Arthritis Sister   . Asthma Sister   . High Cholesterol Daughter   . Thyroid disease Daughter   . Hiatal hernia Son   . Congenital heart disease  Son     Objective: Office vital signs reviewed. BP 128/76   Pulse 73   Temp 97.6 F (36.4 C) (Oral)   Ht 5\' 2"  (1.575 m)   Wt 300 lb (136.1 kg)   SpO2 98%   BMI 54.87 kg/m   Physical Examination:  General: Awake, alert, obese, chronically ill appearing, No acute distress HEENT: Normal, sclera white, MMM, poor dentition Cardio: regular rate and rhythm, S1S2 heard, no murmurs appreciated Pulm: Global expiratory wheeze. Good air movement. No rhonchi or rales; normal work of breathing on room air Extremities: warm, well perfused, No pitting edema, cyanosis or clubbing; +2 pulses bilaterally MSK: antlagic gait and station; uses cane for ambulation  Assessment/ Plan: 58 y.o. female   1. Type 2 diabetes mellitus with diabetic polyneuropathy, without long-term current use of insulin (HCC) Not controlled but A1c is improving.  She has had a decrease from 7.9-7.4 today.  I think we are headed in the right direction.  She notes that she has had some increased appetite and some weight gain that she attributes to a medication that was recently initiated by orthopedist.  I would like to have her discontinue this medication and follow-up with them.  If at our 92-month follow-up for diabetes her A1c has not reached goal of less than 7, we will plan to advance her diabetes regimen.  All medicines have been refilled today. - Bayer DCA Hb A1c Waived - empagliflozin (JARDIANCE) 25 MG TABS tablet; Take 25 mg by mouth daily.  Dispense: 90 tablet; Refill: 1 - glimepiride  (AMARYL) 2 MG tablet; Take 1 tablet (2 mg total) by mouth daily before breakfast.  Dispense: 90 tablet; Refill: 3 - metFORMIN (GLUCOPHAGE) 500 MG tablet; Take 2 tablets (1,000 mg total) by mouth 2 (two) times daily with a meal.  Dispense: 360 tablet; Refill: 1  2. Hypertension associated with diabetes (HCC) Under good control with current regimen.  I have refilled her Lasix to have on hand to use as needed edema.  Use with caution given concomitant use of Jardiance. - furosemide (LASIX) 40 MG tablet; Take 1 tablet (40 mg total) by mouth daily as needed edema or excess fluid.  Dispense: 90 tablet; Refill: 1 - lisinopril (PRINIVIL,ZESTRIL) 10 MG tablet; Take 1 tablet (10 mg total) by mouth daily.  Dispense: 90 tablet; Refill: 3 - metoprolol tartrate (LOPRESSOR) 25 MG tablet; Take 1 tablet (25 mg total) by mouth 2 (two) times daily.  Dispense: 180 tablet; Refill: 3  3. Hyperlipidemia associated with type 2 diabetes mellitus (HCC) Continue Crestor - rosuvastatin (CRESTOR) 20 MG tablet; Take 1 tablet (20 mg total) by mouth at bedtime.  Dispense: 90 tablet; Refill: 3  4. Moderate nonproliferative diabetic retinopathy of both eyes without macular edema associated with type 2 diabetes mellitus (HCC) We discussed ways to reduce this include control of blood pressure and blood sugar.  Encouraged appropriate diet and exercise.  5. Tobacco use disorder Smoking cessation highly recommended.  We spent quite a bit of today's visit discussing this.  She is already purchased patches and plans to start using them "next month".  We discussed the importance of smoking cessation particularly given overall increase in multiple disease states.  I offered initiation of SSRI today but patient declined.  6. Dyspnea on exertion Likely due to underlying COPD.  No evidence of bacterial infection.  Chest x-ray was obtained to further evaluate given ongoing smoking status and demonstrated no acute pulmonary infiltrates or  abnormalities on my personal review.  Awaiting formal review by radiology.  Albuterol inhaler prescribed.  I would like her to come back in about 2 weeks for reevaluation.  We discussed how to use the albuterol inhaler.  If she finds that she is needing this medication multiple times per week, will discuss starting a controller medication.  Also, would like to have formal PFTs performed. - DG Chest 2 View; Future  7. Post-cholecystectomy syndrome - colestipol (COLESTID) 1 g tablet; Take 2 tablets (2 g total) by mouth 2 (two) times daily.  Dispense: 120 tablet; Refill: 4   Orders Placed This Encounter  Procedures  . Bayer DCA Hb A1c Waived   Meds ordered this encounter  Medications  . albuterol (PROVENTIL HFA;VENTOLIN HFA) 108 (90 Base) MCG/ACT inhaler    Sig: Inhale 2 puffs into the lungs every 6 (six) hours as needed for wheezing or shortness of breath.    Dispense:  1 Inhaler    Refill:  0  . colestipol (COLESTID) 1 g tablet    Sig: Take 2 tablets (2 g total) by mouth 2 (two) times daily.    Dispense:  120 tablet    Refill:  4  . empagliflozin (JARDIANCE) 25 MG TABS tablet    Sig: Take 25 mg by mouth daily.    Dispense:  90 tablet    Refill:  1  . DISCONTD: furosemide (LASIX) 40 MG tablet    Sig: Take 1 tablet (40 mg total) by mouth daily.    Dispense:  90 tablet    Refill:  1  . glimepiride (AMARYL) 2 MG tablet    Sig: Take 1 tablet (2 mg total) by mouth daily before breakfast.    Dispense:  90 tablet    Refill:  3  . lisinopril (PRINIVIL,ZESTRIL) 10 MG tablet    Sig: Take 1 tablet (10 mg total) by mouth daily.    Dispense:  90 tablet    Refill:  3  . metFORMIN (GLUCOPHAGE) 500 MG tablet    Sig: Take 2 tablets (1,000 mg total) by mouth 2 (two) times daily with a meal.    Dispense:  360 tablet    Refill:  1  . metoprolol tartrate (LOPRESSOR) 25 MG tablet    Sig: Take 1 tablet (25 mg total) by mouth 2 (two) times daily.    Dispense:  180 tablet    Refill:  3  .  rosuvastatin (CRESTOR) 20 MG tablet    Sig: Take 1 tablet (20 mg total) by mouth at bedtime.    Dispense:  90 tablet    Refill:  3  . furosemide (LASIX) 40 MG tablet    Sig: Take 1 tablet (40 mg total) by mouth daily as needed for fluid or edema.    Dispense:  90 tablet    Refill:  1    Please addend SIG     Ashly Hulen Skains, DO [facility] [phone]

## 2019-01-19 ENCOUNTER — Ambulatory Visit (INDEPENDENT_AMBULATORY_CARE_PROVIDER_SITE_OTHER): Payer: PPO | Admitting: Family Medicine

## 2019-01-19 ENCOUNTER — Encounter: Payer: Self-pay | Admitting: Family Medicine

## 2019-01-19 ENCOUNTER — Other Ambulatory Visit: Payer: Self-pay

## 2019-01-19 DIAGNOSIS — R0609 Other forms of dyspnea: Secondary | ICD-10-CM | POA: Diagnosis not present

## 2019-01-19 DIAGNOSIS — J9801 Acute bronchospasm: Secondary | ICD-10-CM

## 2019-01-19 DIAGNOSIS — F17200 Nicotine dependence, unspecified, uncomplicated: Secondary | ICD-10-CM

## 2019-01-19 DIAGNOSIS — M48062 Spinal stenosis, lumbar region with neurogenic claudication: Secondary | ICD-10-CM | POA: Diagnosis not present

## 2019-01-19 DIAGNOSIS — R0600 Dyspnea, unspecified: Secondary | ICD-10-CM

## 2019-01-19 MED ORDER — METHYLPREDNISOLONE ACETATE 40 MG/ML IJ SUSP
40.0000 mg | Freq: Once | INTRAMUSCULAR | Status: AC
  Administered 2019-01-19: 40 mg via INTRAMUSCULAR

## 2019-01-19 MED ORDER — FLUCONAZOLE 150 MG PO TABS: 150.0000 mg | ORAL_TABLET | Freq: Once | ORAL | 0 refills | Status: AC

## 2019-01-19 MED ORDER — IPRATROPIUM-ALBUTEROL 0.5-2.5 (3) MG/3ML IN SOLN
3.0000 mL | Freq: Once | RESPIRATORY_TRACT | Status: AC
  Administered 2019-01-19: 3 mL via RESPIRATORY_TRACT

## 2019-01-19 NOTE — Progress Notes (Signed)
Subjective: CC: f/u DOE PCP: Stacey Ip, DO [facility]:QPRFF [name] is a 58 y.o. female presenting to clinic today for:  1. Dyspnea on exertion Patient here for 2-week follow-up on dyspnea on exertion.  She was given a prescription for albuterol inhaler at last visit.  She did collect the prescription but has not yet used it.  She was told by her husband, who does have an underlying lung disease, that she should only use this in emergencies.  She has not had an emergency and therefore has not used it.  We discussed that the intent of the medication was to determine what her need actually was so that we can decide if she needed a controller medication or not.  She has not had any fevers, hemoptysis or productive cough.  She is a smoker.  She reports that dyspnea on exertion tends to be short-lived and relieved by rest.  She has had some wheezing.  2.  Chronic low back pain and knee pain Patient is followed by Dr. Darrelyn Hillock for this.  Last point was 2 months ago where she had corticosteroid injection to the knee.  She notes that she was doing well with physical therapy and rehabilitation after the corticosteroid injection and then suddenly felt like symptoms started to worsen again.  At last visit, she attributed her weight gain to use of indomethacin and was going to call the orthopedic office for further instruction/reevaluation.  She tells me today that she has not done this because "things have been so crazy at home".  No falls.  She uses cane for ambulation.  No new sensation changes, fecal incontinence or urinary retention.   ROS: Per HPI  Allergies  Allergen Reactions  . Morphine And Related Nausea And Vomiting   Past Medical History:  Diagnosis Date  . Arthritis   . CAD (coronary artery disease)    BMS to circumflex 2007 - Dr. Juanda Chance  . CKD (chronic kidney disease) stage 3, GFR 30-59 ml/min (HCC)   . Essential hypertension   . Falls frequently   . GERD (gastroesophageal reflux  disease)   . HA (headache)   . Hyperlipidemia   . Left-sided face pain   . Morbid obesity (HCC)   . Noncompliance   . OSA (obstructive sleep apnea)    Uses CPAP  . Type 2 diabetes mellitus (HCC)   . Urinary incontinence     Current Outpatient Medications:  .  albuterol (PROVENTIL HFA;VENTOLIN HFA) 108 (90 Base) MCG/ACT inhaler, Inhale 2 puffs into the lungs every 6 (six) hours as needed for wheezing or shortness of breath., Disp: 1 Inhaler, Rfl: 0 .  aspirin EC 325 MG tablet, Take 325 mg by mouth every morning. , Disp: , Rfl:  .  colestipol (COLESTID) 1 g tablet, Take 2 tablets (2 g total) by mouth 2 (two) times daily., Disp: 120 tablet, Rfl: 4 .  empagliflozin (JARDIANCE) 25 MG TABS tablet, Take 25 mg by mouth daily., Disp: 90 tablet, Rfl: 1 .  furosemide (LASIX) 40 MG tablet, Take 1 tablet (40 mg total) by mouth daily as needed for fluid or edema., Disp: 90 tablet, Rfl: 1 .  gabapentin (NEURONTIN) 300 MG capsule, TAKE 2 CAPSULES BY MOUTH ONCE IN THE MORNING, AND 2 CAPSULE AT NOON, AND 2 CAPSULES ONCE IN THE EVENING, Disp: 540 capsule, Rfl: 1 .  glimepiride (AMARYL) 2 MG tablet, Take 1 tablet (2 mg total) by mouth daily before breakfast., Disp: 90 tablet, Rfl: 3 .  glucose  blood (ACCU-CHEK AVIVA PLUS) test strip, Use aTest blood sugar once daily, Disp: 100 each, Rfl: 2 .  glucose blood (ONETOUCH VERIO) test strip, Test BID and prn  e11.9, Disp: 100 each, Rfl: 11 .  indomethacin (INDOCIN) 25 MG capsule, Take 25 mg by mouth 3 (three) times daily with meals., Disp: , Rfl:  .  lisinopril (PRINIVIL,ZESTRIL) 10 MG tablet, Take 1 tablet (10 mg total) by mouth daily., Disp: 90 tablet, Rfl: 3 .  metFORMIN (GLUCOPHAGE) 500 MG tablet, Take 2 tablets (1,000 mg total) by mouth 2 (two) times daily with a meal., Disp: 360 tablet, Rfl: 1 .  metoprolol tartrate (LOPRESSOR) 25 MG tablet, Take 1 tablet (25 mg total) by mouth 2 (two) times daily., Disp: 180 tablet, Rfl: 3 .  nitroGLYCERIN (NITROSTAT) 0.4 MG  SL tablet, See AUX Label, Disp: 12 tablet, Rfl: 0 .  omeprazole (PRILOSEC) 40 MG capsule, TAKE 1 CAPSULE BY MOUTH ONCE DAILY 30 MINUTES BEFORE A MEAL, Disp: 90 capsule, Rfl: 3 .  ONETOUCH DELICA LANCETS 33G MISC, Check blood sugar BID and prn  E11.9, Disp: 100 each, Rfl: 11 .  rosuvastatin (CRESTOR) 20 MG tablet, Take 1 tablet (20 mg total) by mouth at bedtime., Disp: 90 tablet, Rfl: 3 Social History   Socioeconomic History  . Marital status: Married    Spouse name: [name]  . Number of children: 5  . Years of education: 10 th  . Highest education level: 10th grade  Occupational History  . Occupation: disabled     Comment: disabled  Social Needs  . Financial resource strain: Somewhat hard  . Food insecurity:    Worry: Sometimes true    Inability: Sometimes true  . Transportation needs:    Medical: No    Non-medical: No  Tobacco Use  . Smoking status: Current Every Day Smoker    Packs/day: 1.00    Years: 35.00    Pack years: 35.00    Types: Cigarettes  . Smokeless tobacco: Never Used  Substance and Sexual Activity  . Alcohol use: No    Alcohol/week: 0.0 standard drinks  . Drug use: No  . Sexual activity: Not on file  Lifestyle  . Physical activity:    Days per week: 0 days    Minutes per session: 0 min  . Stress: To some extent  Relationships  . Social connections:    Talks on phone: More than three times a week    Gets together: Once a week    Attends religious service: Never    Active member of club or organization: No    Attends meetings of clubs or organizations: Never    Relationship status: Married  . Intimate partner violence:    Fear of current or ex partner: No    Emotionally abused: No    Physically abused: No    Forced sexual activity: No  Other Topics Concern  . Not on file  Social History Narrative   Patient lives at home with her husband and grandchild [name]). Patient is disabled.   Patient has 10 th grade education.   Right handed.    Caffeine- one  cup of coffee and  One soda Dr.Pepper/ tea. daily   Family History  Problem Relation Age of Onset  . Coronary artery disease Father   . Cancer - Colon Father   . Diabetes Father   . High Cholesterol Father   . Breast cancer Mother   . Arthritis Sister   . Asthma Sister   .  High Cholesterol Daughter   . Thyroid disease Daughter   . Hiatal hernia Son   . Congenital heart disease Son     Objective: Office vital signs reviewed. BP 117/70   Pulse 73   Temp 97.8 F (36.6 C) (Oral)   Ht 5\' 2"  (1.575 m)   Wt (!) 304 lb (137.9 kg)   BMI 55.60 kg/m   Physical Examination:  General: Awake, alert, obese, chronically ill appearing, nontoxic, No acute distress HEENT: Normal, sclera white, MMM Cardio: regular rate and rhythm, S1S2 heard, no murmurs appreciated Pulm: Global expiratory wheezes. No rhonchi or rales.  She has normal work of breathing on room air MSK: antalgic gait and station  Assessment/ Plan: 58 y.o. female   1. Dyspnea on exertion Chest x-ray was obtained at last visit and demonstrated no acute pulmonary infiltrates.  I do think that this is COPD that is not controlled.  She is not demonstrating any signs or symptoms of COPD exacerbation at this time and therefore no antibiotics prescribed.  She was given a dose of Depo-Medrol here in office and also treated with a breathing treatment.  Her expiratory wheezes totally resolved and she subjectively felt much better after the breathing treatment.  I have advised her to use the albuterol inhaler 2 puffs every 6 hours scheduled and then use as needed as directed after that time.  I have asked her to see the pulmonologist for formal PFTs and evaluation.  We discussed that she will likely be placed on a controller inhaler.  We discussed reasons for return and emergent evaluation emergency department.  Patient was good understanding will follow-up PRN. - methylPREDNISolone acetate (DEPO-MEDROL) injection 40 mg -  ipratropium-albuterol (DUONEB) 0.5-2.5 (3) MG/3ML nebulizer solution 3 mL  2. Bronchospasm As above - methylPREDNISolone acetate (DEPO-MEDROL) injection 40 mg - ipratropium-albuterol (DUONEB) 0.5-2.5 (3) MG/3ML nebulizer solution 3 mL  3. Tobacco use disorder Contemplative.  We will continue to counsel with each visit.  4. Spinal stenosis, lumbar region, with neurogenic claudication No red flag symptoms or signs.  I did advise her to contact her orthopedist for recheck given return of symptoms.  Hopefully the Depo-Medrol injection that was provided today will also relieve some of the aches and pain she is describing today.   Orders Placed This Encounter  Procedures  . Ambulatory referral to Pulmonology    Referral Priority:   Routine    Referral Type:   Consultation    Referral Reason:   Specialty Services Required    Requested Specialty:   Pulmonary Disease    Number of Visits Requested:   1   Meds ordered this encounter  Medications  . methylPREDNISolone acetate (DEPO-MEDROL) injection 40 mg  . ipratropium-albuterol (DUONEB) 0.5-2.5 (3) MG/3ML nebulizer solution 3 mL  . fluconazole (DIFLUCAN) 150 MG tablet    Sig: Take 1 tablet (150 mg total) by mouth once for 1 dose.    Dispense:  1 tablet    Refill:  0      Hulen Skains, DO [facility] [phone]

## 2019-01-19 NOTE — Patient Instructions (Addendum)
Use the albuterol every 6 hours for the next 2 days.  Then use it every 6 hours IF NEEDED. I have placed a referral to the lung specialist.  I think you have COPD but I would like formal testing to evaluate this further. You were given a steroid shot today and given a breathing treatment.   If you develop productive cough, worsening shortness of breath or if your symptoms get worse, please seek immediate medical attention.   COPD and Physical Activity Chronic obstructive pulmonary disease (COPD) is a long-term (chronic) condition that affects the lungs. COPD is a general term that can be used to describe many different lung problems that cause lung swelling (inflammation) and limit airflow, including chronic bronchitis and emphysema. The main symptom of COPD is shortness of breath, which makes it harder to do even simple tasks. This can also make it harder to exercise and be active. Talk with your health care provider about treatments to help you breathe better and actions you can take to prevent breathing problems during physical activity. What are the benefits of exercising with COPD? Exercising regularly is an important part of a healthy lifestyle. You can still exercise and do physical activities even though you have COPD. Exercise and physical activity improve your shortness of breath by increasing blood flow (circulation). This causes your heart to pump more oxygen through your body. Moderate exercise can improve your:  Oxygen use.  Energy level.  Shortness of breath.  Strength in your breathing muscles.  Heart health.  Sleep.  Self-esteem and feelings of self-worth.  Depression, stress, and anxiety levels. Exercise can benefit everyone with COPD. The severity of your disease may affect how hard you can exercise, especially at first, but everyone can benefit. Talk with your health care provider about how much exercise is safe for you, and which activities and exercises are safe for  you. What actions can I take to prevent breathing problems during physical activity?  Sign up for a pulmonary rehabilitation program. This type of program may include: ? Education about lung diseases. ? Exercise classes that teach you how to exercise and be more active while improving your breathing. This usually involves:  Exercise using your lower extremities, such as a stationary bicycle.  About 30 minutes of exercise, 2 to 5 times per week, for 6 to 12 weeks  Strength training, such as push ups or leg lifts. ? Nutrition education. ? Group classes in which you can talk with others who also have COPD and learn ways to manage stress.  If you use an oxygen tank, you should use it while you exercise. Work with your health care provider to adjust your oxygen for your physical activity. Your resting flow rate is different from your flow rate during physical activity.  While you are exercising: ? Take slow breaths. ? Pace yourself and do not try to go too fast. ? Purse your lips while breathing out. Pursing your lips is similar to a kissing or whistling position. ? If doing exercise that uses a quick burst of effort, such as weight lifting:  Breathe in before starting the exercise.  Breathe out during the hardest part of the exercise (such as raising the weights). Where to find support You can find support for exercising with COPD from:  Your health care provider.  A pulmonary rehabilitation program.  Your local health department or community health programs.  Support groups, online or in-person. Your health care provider may be able to recommend support  groups. Where to find more information You can find more information about exercising with COPD from:  American Lung Association: [place].hu.  COPD Foundation: AlmostHot.gl. Contact a health care provider if:  Your symptoms get worse.  You have chest pain.  You have nausea.  You have a fever.  You have trouble  talking or catching your breath.  You want to start a new exercise program or a new activity. Summary  COPD is a general term that can be used to describe many different lung problems that cause lung swelling (inflammation) and limit airflow. This includes chronic bronchitis and emphysema.  Exercise and physical activity improve your shortness of breath by increasing blood flow (circulation). This causes your heart to provide more oxygen to your body.  Contact your health care provider before starting any exercise program or new activity. Ask your health care provider what exercises and activities are safe for you. This information is not intended to replace advice given to you by your health care provider. Make sure you discuss any questions you have with your health care provider. Document Released: 11/17/2017 Document Revised: 11/17/2017 Document Reviewed: 11/17/2017 Elsevier Interactive Patient Education  2019 [employer].

## 2019-01-30 ENCOUNTER — Telehealth: Payer: Self-pay | Admitting: Family Medicine

## 2019-01-30 ENCOUNTER — Other Ambulatory Visit: Payer: Self-pay

## 2019-01-30 ENCOUNTER — Other Ambulatory Visit: Payer: Self-pay | Admitting: Family Medicine

## 2019-01-30 DIAGNOSIS — J42 Unspecified chronic bronchitis: Secondary | ICD-10-CM

## 2019-01-30 MED ORDER — TIOTROPIUM BROMIDE-OLODATEROL 2.5-2.5 MCG/ACT IN AERS: 1.0000 | INHALATION_SPRAY | Freq: Every day | RESPIRATORY_TRACT | 0 refills | Status: DC

## 2019-01-30 MED ORDER — ALBUTEROL SULFATE HFA 108 (90 BASE) MCG/ACT IN AERS: 2.0000 | INHALATION_SPRAY | Freq: Four times a day (QID) | RESPIRATORY_TRACT | 0 refills | Status: DC | PRN

## 2019-01-30 MED ORDER — UMECLIDINIUM-VILANTEROL 62.5-25 MCG/INH IN AEPB: 1.0000 | INHALATION_SPRAY | Freq: Every day | RESPIRATORY_TRACT | 12 refills | Status: DC

## 2019-01-30 NOTE — Progress Notes (Signed)
I contacted the patient with regards to her PHQ 9 score which was elevated at her visit on 01/19/2019.  She reports that depressive symptoms are stable.  She declines referral to counseling services or to our clinical social worker here in office at this time but she will think this over and contact me back if she changes her mind.  She goes on to state that she continues to have dyspnea on exertion.  Her overall coughing spells that she was seen for a week ago have improved but she continues to have worsening dyspnea on exertion.  At her last visit, a referral to pulmonology was placed but she has not yet seen them.  She has been using her albuterol inhaler 2 puffs every 6 hours since our last visit and she does state that this seems to help with breathing.  She is wondering if she can have a refill on this medicine and possibly another inhaler to help with her shortness of breath.  I was suspecting that she had uncontrolled COPD at last visit.  I did not start any controller inhalers because I was hoping that she would have pulmonary function tests to further evaluate lung function.  However, given COVID-19 outbreak I will go ahead and send in a controller medication to start.  Anoro has been prescribed.  Albuterol has been refilled.  Reasons for emergent evaluation discussed.  She will follow-up PRN.

## 2019-01-30 NOTE — Telephone Encounter (Signed)
All of these inhalers are likely to be costly, as they are only available as brand name.  I will replace with Siolto.  She may pick up a sample.  I have one in my office and will place up front.  It will be up to her to contact her insurance company to see what is affordable.

## 2019-01-30 NOTE — Telephone Encounter (Signed)
Pt called and aware = will come by and get it

## 2019-01-30 NOTE — Patient Outreach (Signed)
[facility]) Care Management  01/30/2019  DEBBY MEDDOCK [birth year] [id number]    1st attempt to outreach the patient.  The patient's spouse answered the phone and stated that the patient was out going to see her aunt to check on her. HIPAA compliant voicemail left with contact information.   Plan: RN Health Coach will send letter. Rn Health Coach will make outreach attempt to the patient within thirty business days.  [name] RN, BSN, [facility] RN Health Coach Disease Management [facility] Dial:  [phone]  Fax: [phone]

## 2019-01-30 NOTE — Telephone Encounter (Signed)
Anoro samples are not available right now. Can she switch to something else?  (only samples available right now is Trelegy)

## 2019-02-01 ENCOUNTER — Telehealth: Payer: Self-pay | Admitting: Family Medicine

## 2019-02-01 DIAGNOSIS — E1142 Type 2 diabetes mellitus with diabetic polyneuropathy: Secondary | ICD-10-CM

## 2019-02-01 MED ORDER — EMPAGLIFLOZIN 25 MG PO TABS: 25.0000 mg | ORAL_TABLET | Freq: Every day | ORAL | 1 refills | Status: DC

## 2019-02-01 NOTE — Telephone Encounter (Signed)
Has the patient contacted her insurance company to see what is covered? I will send a replacement for sample once she lets me know what they will cover.

## 2019-02-01 NOTE — Telephone Encounter (Signed)
Samples given message routed to Dr. Reece Agar

## 2019-02-01 NOTE — Telephone Encounter (Signed)
PT states Dr Reece Agar gave her a inhalder to try  Tiotropium Bromide-Olodaterol (STIOLTO RESPIMAT) 2.5-2.5 MCG/ACT AERS And yesterday she tried it for first time and had a pain Described as throbbing pain in chest after took the inhaler it started about 30 mins after using the inhaler    Pt also wants to know if we have samples of jardiance

## 2019-02-01 NOTE — Telephone Encounter (Signed)
Stop the medication, past this note to Dr. Nadine Counts for tomorrow.  I am not going to replace that medication at this time.  I doubt there are samples of Jardiance due to drug reps not coming into the office.  It is okay if you want to check, but probably Dr. Nadine Counts checked yesterday.

## 2019-02-01 NOTE — Telephone Encounter (Signed)
Patient aware and will contact insurance company to see which medication they will cover

## 2019-02-02 ENCOUNTER — Telehealth: Payer: Self-pay

## 2019-02-02 NOTE — Telephone Encounter (Signed)
Patient has an elevated PHQ score of 23.  Staff message sent to PCP.  Writer left a voice mail message to follow up with the patient regarding [facility] services.

## 2019-02-07 ENCOUNTER — Other Ambulatory Visit: Payer: Self-pay

## 2019-02-07 NOTE — Patient Outreach (Addendum)
[facility]) Care Management  02/07/2019   [name] [birth year] [id number]  Subjective: Successful outreach.  HIPAA verified. The patient states that she has been doing fair.  She denies any falls.  She states that she has had pain, aching all over her body.  She rates that pain at 5/10.  Patient takes medication to help manage the pain. The patient had her a1c checked in February and she came down some from a 7.9 to a 7.4. The patient states that her blood sugars have been ranging from 178-277.  Discussed with the patient about writing down what she is eating to help narrow down what could increase her blood sugars. She verbalized understanding.  She states that she is taking her medications.  She states that she has been having a hard time obtaining food.  Gave the patient  three food resources in her area with the name  address and phone numbers. The patient was appreciative of the information.   Current Medications:  Current Outpatient Medications  Medication Sig Dispense Refill  . albuterol (PROVENTIL HFA;VENTOLIN HFA) 108 (90 Base) MCG/ACT inhaler Inhale 2 puffs into the lungs every 6 (six) hours as needed for wheezing or shortness of breath (for rescue). 1 Inhaler 0  . aspirin EC 325 MG tablet Take 325 mg by mouth every morning.     . colestipol (COLESTID) 1 g tablet Take 2 tablets (2 g total) by mouth 2 (two) times daily. 120 tablet 4  . empagliflozin (JARDIANCE) 25 MG TABS tablet Take 25 mg by mouth daily. 28 tablet 1  . furosemide (LASIX) 40 MG tablet Take 1 tablet (40 mg total) by mouth daily as needed for fluid or edema. 90 tablet 1  . gabapentin (NEURONTIN) 300 MG capsule TAKE 2 CAPSULES BY MOUTH ONCE IN THE MORNING, AND 2 CAPSULE AT NOON, AND 2 CAPSULES ONCE IN THE EVENING 540 capsule 1  . glimepiride (AMARYL) 2 MG tablet Take 1 tablet (2 mg total) by mouth daily before breakfast. 90 tablet 3  . glucose blood (ACCU-CHEK AVIVA PLUS) test strip Use aTest blood sugar  once daily 100 each 2  . glucose blood (ONETOUCH VERIO) test strip Test BID and prn  e11.9 100 each 11  . indomethacin (INDOCIN) 25 MG capsule Take 25 mg by mouth 3 (three) times daily with meals.    Marland Kitchen lisinopril (PRINIVIL,ZESTRIL) 10 MG tablet Take 1 tablet (10 mg total) by mouth daily. 90 tablet 3  . metFORMIN (GLUCOPHAGE) 500 MG tablet Take 2 tablets (1,000 mg total) by mouth 2 (two) times daily with a meal. 360 tablet 1  . metoprolol tartrate (LOPRESSOR) 25 MG tablet Take 1 tablet (25 mg total) by mouth 2 (two) times daily. 180 tablet 3  . nitroGLYCERIN (NITROSTAT) 0.4 MG SL tablet See AUX Label 12 tablet 0  . omeprazole (PRILOSEC) 40 MG capsule TAKE 1 CAPSULE BY MOUTH ONCE DAILY 30 MINUTES BEFORE A MEAL 90 capsule 3  . ONETOUCH DELICA LANCETS 33G MISC Check blood sugar BID and prn  E11.9 100 each 11  . rosuvastatin (CRESTOR) 20 MG tablet Take 1 tablet (20 mg total) by mouth at bedtime. 90 tablet 3  . Tiotropium Bromide-Olodaterol (STIOLTO RESPIMAT) 2.5-2.5 MCG/ACT AERS Inhale 1 puff into the lungs daily. (Patient not taking: Reported on 02/07/2019) 1 Inhaler 0   No current facility-administered medications for this visit.     Functional Status:  In your present state of health, do you have any difficulty performing the  following activities: 11/27/2018  Hearing? N  Vision? N  Difficulty concentrating or making decisions? Y  Comment Trouble remembering at times where she puts things  Walking or climbing stairs? Y  Comment Due to bilateral knee pain  Dressing or bathing? N  Doing errands, shopping? N  Preparing Food and eating ? N  Using the Toilet? N  In the past six months, have you accidently leaked urine? Y  Comment Wears pads or depends due to incontience   Do you have problems with loss of bowel control? Y  Comment Sometimes has bowel urgency, and if she does not get to the bathroom soon enough she might have an accident int he past.  Managing your Medications? N  Managing your  Finances? N  Housekeeping or managing your Housekeeping? N  Some recent data might be hidden    Fall/Depression Screening: Fall Risk  02/07/2019 01/19/2019 01/01/2019  Falls in the past year? 0 0 1  Comment - - -  Number falls in past yr: - - 0  Injury with Fall? - - 1  Comment - - Per patient she hurt her knee  Risk Factor Category  - - -  Risk for fall due to : - - History of fall(s);Impaired balance/gait;Impaired mobility  Follow up - - Falls evaluation completed;Falls prevention discussed   PHQ 2/9 Scores 01/19/2019 01/05/2019 11/27/2018 11/23/2018 07/24/2018 06/15/2018 05/19/2018  PHQ - 2 Score 6 6 5 5 6 5  0  PHQ- 9 Score 23 18 20 20 25 16  -  Exception Documentation - - - - - - -  Not completed - - - - - - -    Assessment: Patient will continue to benefit from health coach outreach for disease management and support. [facility] CM Care Plan Problem One     Most Recent Value  [facility] Long Term Goal   in 90 days the patient will verbalize that  her a1c  of 7.4 has been lowered by 1-2 points .  [facility] Long Term Goal Start Date  02/07/19  Interventions for Problem One Long Term Goal  Discussed blood sugar readings, talked about  low carb diet,, Discussed with patient about writing down foods that she has eaten to figure out what is causeing her blood sugars to rise and medication adherence.     Plan: RN Health Coach will contact patient in the month of July and patient agrees to next outreach.   [name] RN, BSN, [facility] RN Health Coach Disease Management [facility] Dial:  [phone]  Fax: [phone]

## 2019-02-26 ENCOUNTER — Telehealth: Payer: Self-pay

## 2019-02-26 NOTE — Telephone Encounter (Signed)
Writer left message

## 2019-03-13 ENCOUNTER — Other Ambulatory Visit: Payer: Self-pay | Admitting: Pediatrics

## 2019-03-13 DIAGNOSIS — E1142 Type 2 diabetes mellitus with diabetic polyneuropathy: Secondary | ICD-10-CM

## 2019-03-13 DIAGNOSIS — K219 Gastro-esophageal reflux disease without esophagitis: Secondary | ICD-10-CM

## 2019-03-14 ENCOUNTER — Other Ambulatory Visit: Payer: Self-pay

## 2019-03-14 ENCOUNTER — Ambulatory Visit (INDEPENDENT_AMBULATORY_CARE_PROVIDER_SITE_OTHER): Payer: PPO | Admitting: Family Medicine

## 2019-03-14 VITALS — BP 127/66 | HR 67

## 2019-03-14 DIAGNOSIS — E1142 Type 2 diabetes mellitus with diabetic polyneuropathy: Secondary | ICD-10-CM | POA: Diagnosis not present

## 2019-03-14 DIAGNOSIS — I152 Hypertension secondary to endocrine disorders: Secondary | ICD-10-CM

## 2019-03-14 DIAGNOSIS — I10 Essential (primary) hypertension: Secondary | ICD-10-CM

## 2019-03-14 DIAGNOSIS — J42 Unspecified chronic bronchitis: Secondary | ICD-10-CM | POA: Diagnosis not present

## 2019-03-14 DIAGNOSIS — E1159 Type 2 diabetes mellitus with other circulatory complications: Secondary | ICD-10-CM | POA: Diagnosis not present

## 2019-03-14 MED ORDER — PREDNISONE 20 MG PO TABS: 40.0000 mg | ORAL_TABLET | Freq: Every day | ORAL | 0 refills | Status: AC

## 2019-03-14 MED ORDER — ALBUTEROL SULFATE HFA 108 (90 BASE) MCG/ACT IN AERS: 2.0000 | INHALATION_SPRAY | Freq: Four times a day (QID) | RESPIRATORY_TRACT | 0 refills | Status: DC | PRN

## 2019-03-14 NOTE — Progress Notes (Signed)
Telephone visit  Subjective: CC: f/u DM2 PCP: Raliegh IpGottschalk, Sharaine Delange M, DO ZOX:WRUEAHPI:Stacey Pasty ArchJ [name] is a 58 y.o. female calls for telephone consult today. Patient provides verbal consent for consult held via phone.  Location of patient: home Location of provider: Working remotely from home Others present for call: none  1. Type 2 Diabetes with hypertension and hyperlipidemia; Shortness of breath  Patient reports FBG: 125-132.  She notes that her sugars had gotten as low as 71-80 around [place] time.  She does not monitor her weight.  She reports compliance with metformin, Jardiance and glimepiride.  She does report that the [place] PepperJardiance is becoming unaffordable at $30 per month.  She contacted her insurance company but they did not offer her alternatives that were more affordable.  She is wondering if we can change this medication to something else.  Medical history significant for coronary artery disease as well.  She is compliant with lisinopril and metoprolol.  Last eye exam: Up to date Last foot exam: Up-to-date Last A1c:  Lab Results  Component Value Date   HGBA1C 7.4 (H) 01/05/2019   Nephropathy screen indicated?:  On ACE inhibitor Last flu, zoster and/or pneumovax:  Immunization History  Administered Date(s) Administered  . Influenza,inj,Quad PF,6+ Mos 08/10/2013, 08/28/2014, 09/25/2015, 11/23/2017, 11/27/2018  . Pneumococcal Conjugate-13 11/27/2018    ROS: No chest pain, loss of consciousness.  She has had some shortness of breath over the last 2 days that is relieved by albuterol inhaler.  At our last visit she was referred to pulmonology and has an appointment at the end of June with them.  Denies any hemoptysis, fevers.   ROS: Per HPI  Allergies  Allergen Reactions  . Morphine And Related Nausea And Vomiting   Past Medical History:  Diagnosis Date  . Arthritis   . CAD (coronary artery disease)    BMS to circumflex 2007 - Dr. Juanda ChanceBrodie  . CKD (chronic kidney disease) stage 3, GFR  30-59 ml/min (HCC)   . Essential hypertension   . Falls frequently   . GERD (gastroesophageal reflux disease)   . HA (headache)   . Hyperlipidemia   . Left-sided face pain   . Morbid obesity (HCC)   . Noncompliance   . OSA (obstructive sleep apnea)    Uses CPAP  . Type 2 diabetes mellitus (HCC)   . Urinary incontinence     Current Outpatient Medications:  .  albuterol (PROVENTIL HFA;VENTOLIN HFA) 108 (90 Base) MCG/ACT inhaler, Inhale 2 puffs into the lungs every 6 (six) hours as needed for wheezing or shortness of breath (for rescue)., Disp: 1 Inhaler, Rfl: 0 .  aspirin EC 325 MG tablet, Take 325 mg by mouth every morning. , Disp: , Rfl:  .  colestipol (COLESTID) 1 g tablet, Take 2 tablets (2 g total) by mouth 2 (two) times daily., Disp: 120 tablet, Rfl: 4 .  empagliflozin (JARDIANCE) 25 MG TABS tablet, Take 25 mg by mouth daily., Disp: 28 tablet, Rfl: 1 .  furosemide (LASIX) 40 MG tablet, Take 1 tablet (40 mg total) by mouth daily as needed for fluid or edema., Disp: 90 tablet, Rfl: 1 .  gabapentin (NEURONTIN) 300 MG capsule, TAKE 2 CAPSULES BY MOUTH ONCE IN THE MORNING, AND 2 CAPSULE AT NOON, AND 2 CAPSULES ONCE IN THE EVENING, Disp: 540 capsule, Rfl: 1 .  glimepiride (AMARYL) 2 MG tablet, Take 1 tablet (2 mg total) by mouth daily before breakfast., Disp: 90 tablet, Rfl: 3 .  glucose blood (ACCU-CHEK AVIVA PLUS) test  strip, Use aTest blood sugar once daily, Disp: 100 each, Rfl: 2 .  indomethacin (INDOCIN) 25 MG capsule, Take 25 mg by mouth 3 (three) times daily with meals., Disp: , Rfl:  .  lisinopril (PRINIVIL,ZESTRIL) 10 MG tablet, Take 1 tablet (10 mg total) by mouth daily., Disp: 90 tablet, Rfl: 3 .  metFORMIN (GLUCOPHAGE) 500 MG tablet, Take 2 tablets (1,000 mg total) by mouth 2 (two) times daily with a meal., Disp: 360 tablet, Rfl: 1 .  metoprolol tartrate (LOPRESSOR) 25 MG tablet, Take 1 tablet (25 mg total) by mouth 2 (two) times daily., Disp: 180 tablet, Rfl: 3 .  nitroGLYCERIN  (NITROSTAT) 0.4 MG SL tablet, See AUX Label, Disp: 12 tablet, Rfl: 0 .  omeprazole (PRILOSEC) 40 MG capsule, TAKE 1 CAPSULE BY MOUTH ONCE DAILY 30  MINUTES  BEFORE  A  MEAL, Disp: 90 capsule, Rfl: 0 .  ONETOUCH DELICA LANCETS 33G MISC, Check blood sugar BID and prn  E11.9, Disp: 100 each, Rfl: 11 .  ONETOUCH VERIO test strip, USE 1 STRIP TO CHECK GLUCOSE TWICE DAILY AND AS NEEDED, Disp: 100 each, Rfl: 5 .  rosuvastatin (CRESTOR) 20 MG tablet, Take 1 tablet (20 mg total) by mouth at bedtime., Disp: 90 tablet, Rfl: 3 .  Tiotropium Bromide-Olodaterol (STIOLTO RESPIMAT) 2.5-2.5 MCG/ACT AERS, Inhale 1 puff into the lungs daily. (Patient not taking: Reported on 02/07/2019), Disp: 1 Inhaler, Rfl: 0  Vitals:   03/14/19 0841  BP: 127/66  Pulse: 67    Assessment/ Plan: 58 y.o. female   1. Chronic bronchitis, unspecified chronic bronchitis type [facility]) Patient with ongoing symptoms that was responsive to corticosteroid and inhaler a couple of months ago.  I have prescribed prednisone burst and renewed her albuterol inhaler.  Of instructed to use 2 puffs every 6 hours scheduled for 2 days then as needed as directed.  We discussed reasons for emergent evaluation emergency department.  She has scheduled appointment with pulmonology at the end of June -prednisone (DELTASONE) 20 MG tablet; Take 2 tablets (40 mg total) by mouth daily with breakfast for 5 days.  Dispense: 10 tablet; Refill: 0 - albuterol (VENTOLIN HFA) 108 (90 Base) MCG/ACT inhaler; Inhale 2 puffs into the lungs every 6 (six) hours as needed for wheezing or shortness of breath (for rescue).  Dispense: 1 Inhaler; Refill: 0  2. Type 2 diabetes mellitus with diabetic polyneuropathy, without long-term current use of insulin (HCC) Seemingly controlled with fasting blood sugars around 120-130s.  We will plan for A1c at her next visit.  She is having difficulty with Jardiance affordability and I have instructed her to consider revisiting their site for the  patient assistance program.  If she is not able to secure medication assistance we will need to consider changing the medicine.  Though we are unsure as to what her insurance preferred drugs are as she had difficulty with affording Januvia last year.  She will contact her insurance company to find this information out and contact me back so that we can find her medication that is affordable.  In the meantime, she has left over 10 mg tablets of Jardiance and I instructed her to take 2 of these at a time in efforts to come close to the Jardiance 25 mg dose.  Continue other medications as directed.  3. Hypertension associated with diabetes (HCC) Controlled.  Continue current regimen.   Start time: 8:24am End time: 8:40a  Total time spent on patient care (including telephone call/ virtual visit): 21 minutes  Janora Norlander, DO [facility] [phone]

## 2019-03-29 ENCOUNTER — Telehealth: Payer: Self-pay | Admitting: Family Medicine

## 2019-03-29 NOTE — Telephone Encounter (Signed)
Forward this to Dr. Nadine Counts when she gets back and she can make a decision about it.

## 2019-04-03 NOTE — Telephone Encounter (Signed)
Unfortunately there are no cheaper alternatives.  She is already on metformin and glimepiride.  Did she call her insurance for alternatives that might be cheaper copays or apply for the medication assistance program through the manufacturer of Jardiance like we discussed?

## 2019-04-03 NOTE — Telephone Encounter (Signed)
Insurance was no help per pt. I gave her the patient assistance # again. She will call today

## 2019-04-04 ENCOUNTER — Other Ambulatory Visit: Payer: Self-pay

## 2019-04-05 ENCOUNTER — Ambulatory Visit: Payer: PPO | Admitting: Family Medicine

## 2019-04-05 ENCOUNTER — Telehealth: Payer: Self-pay | Admitting: *Deleted

## 2019-04-05 ENCOUNTER — Ambulatory Visit (INDEPENDENT_AMBULATORY_CARE_PROVIDER_SITE_OTHER): Payer: PPO | Admitting: Family Medicine

## 2019-04-05 DIAGNOSIS — M5441 Lumbago with sciatica, right side: Secondary | ICD-10-CM | POA: Diagnosis not present

## 2019-04-05 DIAGNOSIS — M5442 Lumbago with sciatica, left side: Secondary | ICD-10-CM | POA: Diagnosis not present

## 2019-04-05 DIAGNOSIS — G8929 Other chronic pain: Secondary | ICD-10-CM | POA: Diagnosis not present

## 2019-04-05 DIAGNOSIS — K1379 Other lesions of oral mucosa: Secondary | ICD-10-CM

## 2019-04-05 DIAGNOSIS — R600 Localized edema: Secondary | ICD-10-CM | POA: Diagnosis not present

## 2019-04-05 MED ORDER — FIRST-DUKES MOUTHWASH MT SUSP: OROMUCOSAL | 0 refills | Status: DC

## 2019-04-05 MED ORDER — TIZANIDINE HCL 2 MG PO TABS: 1.0000 mg | ORAL_TABLET | Freq: Three times a day (TID) | ORAL | 0 refills | Status: DC | PRN

## 2019-04-05 NOTE — Telephone Encounter (Signed)
Please review script for mouth wash.. Pharmacy needs clarification.  Quantity of 300 or 480 ?  Ratio of one to 8 with lidocaine?  Please correct and send in new script.

## 2019-04-05 NOTE — Telephone Encounter (Signed)
Yes .  I want 28mL of lidocaine per of Magic mouthwash.  Please inform them.  The script should not need adjustment.

## 2019-04-05 NOTE — Telephone Encounter (Signed)
RX clarified with pharmacy.

## 2019-04-05 NOTE — Progress Notes (Signed)
Telephone visit  Subjective: CC: oral pain PCP: Raliegh IpGottschalk, Zenya Hickam M, DO WUJ:WJXBJHPI:[name] is a 58 y.o. female calls for telephone consult today. Patient provides verbal consent for consult held via phone.  Location of patient: home Location of provider: [facility] Others present for call: none  1. Oral pain Patient reports a few day history of oral pain.  She reports burning and blistering of her mouth.  She is unsure if this is related to use of her albuterol inhaler which she has been using fairly consistently up until 2 days ago.  Over the last 2 days she has not felt a need for the albuterol.  She was treated with a course of corticosteroids for breathing flare recently.  She is able to keep water down but she notes difficulty eating because of oral pain  2.  Chronic low back pain Patient reports exacerbation of her chronic low back pain over the last several days.  She notes that she is having quite a bit of pain with ambulation.  The pain extends across the entire low back and radiates into her buttocks.  She notes that she has a history of back pain with degenerative changes that required surgery several years ago.  She saw Dr. Darrelyn HillockGioffre for this.  She has been taking the indomethacin twice daily as prescribed by the orthopedist.  She took 500 mg of Tylenol once a couple of days ago.  She denies any saddle anesthesia, fecal incontinence or urinary retention.  She is been using heat.  3.  Lower extremity edema Patient reports several day history of bilateral pedal edema.  She notes that her feet are the size of "baseball".  She has been taking her Lasix intermittently with good urine output but no total resolution of the swelling in her feet.  She is taking oral NSAID as above.  No shortness of breath.  No chest pain.   ROS: Per HPI  Allergies  Allergen Reactions  . Morphine And Related Nausea And Vomiting   Past Medical History:  Diagnosis Date  . Arthritis   . CAD (coronary artery  disease)    BMS to circumflex 2007 - Dr. Juanda ChanceBrodie  . CKD (chronic kidney disease) stage 3, GFR 30-59 ml/min (HCC)   . Essential hypertension   . Falls frequently   . GERD (gastroesophageal reflux disease)   . HA (headache)   . Hyperlipidemia   . Left-sided face pain   . Morbid obesity (HCC)   . Noncompliance   . OSA (obstructive sleep apnea)    Uses CPAP  . Type 2 diabetes mellitus (HCC)   . Urinary incontinence     Current Outpatient Medications:  .  albuterol (VENTOLIN HFA) 108 (90 Base) MCG/ACT inhaler, Inhale 2 puffs into the lungs every 6 (six) hours as needed for wheezing or shortness of breath (for rescue)., Disp: 1 Inhaler, Rfl: 0 .  aspirin EC 325 MG tablet, Take 325 mg by mouth every morning. , Disp: , Rfl:  .  colestipol (COLESTID) 1 g tablet, Take 2 tablets (2 g total) by mouth 2 (two) times daily., Disp: 120 tablet, Rfl: 4 .  empagliflozin (JARDIANCE) 25 MG TABS tablet, Take 25 mg by mouth daily., Disp: 28 tablet, Rfl: 1 .  furosemide (LASIX) 40 MG tablet, Take 1 tablet (40 mg total) by mouth daily as needed for fluid or edema., Disp: 90 tablet, Rfl: 1 .  gabapentin (NEURONTIN) 300 MG capsule, TAKE 2 CAPSULES BY MOUTH ONCE IN THE MORNING, AND  2 CAPSULE AT NOON, AND 2 CAPSULES ONCE IN THE EVENING, Disp: 540 capsule, Rfl: 1 .  glimepiride (AMARYL) 2 MG tablet, Take 1 tablet (2 mg total) by mouth daily before breakfast., Disp: 90 tablet, Rfl: 3 .  glucose blood (ACCU-CHEK AVIVA PLUS) test strip, Use aTest blood sugar once daily, Disp: 100 each, Rfl: 2 .  indomethacin (INDOCIN) 25 MG capsule, Take 25 mg by mouth 3 (three) times daily with meals., Disp: , Rfl:  .  lisinopril (PRINIVIL,ZESTRIL) 10 MG tablet, Take 1 tablet (10 mg total) by mouth daily., Disp: 90 tablet, Rfl: 3 .  metFORMIN (GLUCOPHAGE) 500 MG tablet, Take 2 tablets (1,000 mg total) by mouth 2 (two) times daily with a meal., Disp: 360 tablet, Rfl: 1 .  metoprolol tartrate (LOPRESSOR) 25 MG tablet, Take 1 tablet (25 mg  total) by mouth 2 (two) times daily., Disp: 180 tablet, Rfl: 3 .  nitroGLYCERIN (NITROSTAT) 0.4 MG SL tablet, See AUX Label, Disp: 12 tablet, Rfl: 0 .  omeprazole (PRILOSEC) 40 MG capsule, TAKE 1 CAPSULE BY MOUTH ONCE DAILY 30  MINUTES  BEFORE  A  MEAL, Disp: 90 capsule, Rfl: 0 .  ONETOUCH DELICA LANCETS 33G MISC, Check blood sugar BID and prn  E11.9, Disp: 100 each, Rfl: 11 .  ONETOUCH VERIO test strip, USE 1 STRIP TO CHECK GLUCOSE TWICE DAILY AND AS NEEDED, Disp: 100 each, Rfl: 5 .  rosuvastatin (CRESTOR) 20 MG tablet, Take 1 tablet (20 mg total) by mouth at bedtime., Disp: 90 tablet, Rfl: 3 .  Tiotropium Bromide-Olodaterol (STIOLTO RESPIMAT) 2.5-2.5 MCG/ACT AERS, Inhale 1 puff into the lungs daily. (Patient not taking: Reported on 02/07/2019), Disp: 1 Inhaler, Rfl: 0  Assessment/ Plan: 58 y.o. female   1. Chronic bilateral low back pain with bilateral sciatica Chronic issue with history of back surgery.  I have asked that she schedule Tylenol.  May need to consider avoiding NSAID medications given history of renal disease and current lower extremity edema.  I have sent in Zanaflex in efforts to help with her back.  I have also has referral back to Dr. Jeannetta Ellis office for reevaluation. - tiZANidine (ZANAFLEX) 2 MG tablet; Take 0.5-1 tablets (1-2 mg total) by mouth every 8 (eight) hours as needed for muscle spasms.  Dispense: 30 tablet; Refill: 0 - Ambulatory referral to Orthopedic Surgery  2. Oral pain Likely thrush related to recent corticosteroid use.  I have prescribed him Magic mouthwash containing lidocaine. - Diphenhyd-Hydrocort-Nystatin (FIRST-DUKES MOUTHWASH) SUSP; Gargle and spit out 64mL every 6 hours as needed for oral pain.  Dispense: 300 mL; Refill: 0  3. Lower extremity edema Possibly related to venous stasis versus use of NSAIDs versus renal disease.  I have advised against use of oral NSAIDs, encouraged her to elevate her lower extremities.  Continue Lasix p.o. every morning and  add 1/2 tablet every afternoon for the next 2 to 3 days.  Additionally, patient continues to work on patient assistance program for her [place].  I have placed a 1 month supply of samples upfront for her to come in and retrieve.  Start time: 9:38am End time: 9:51am  Total time spent on patient care (including telephone call/ virtual visit): 19 minutes  Mayfield Schoene Hulen Skains, DO [facility] [phone]

## 2019-04-11 DIAGNOSIS — M545 Low back pain, unspecified: Secondary | ICD-10-CM | POA: Insufficient documentation

## 2019-04-30 ENCOUNTER — Other Ambulatory Visit: Payer: Self-pay

## 2019-04-30 NOTE — Patient Outreach (Signed)
[facility]) Care Management  04/30/2019  [name] Mar 27, 1961 [id number]    RN Health Coach closing the program.  Patient is transitioning to external program [facility] for continued case management.  Lazaro Arms RN, BSN, [place] Direct Dial:  [phone]  Fax: [phone]

## 2019-05-08 ENCOUNTER — Institutional Professional Consult (permissible substitution): Payer: PPO | Admitting: Internal Medicine

## 2019-05-09 ENCOUNTER — Ambulatory Visit: Payer: PPO

## 2019-05-14 ENCOUNTER — Telehealth: Payer: Self-pay | Admitting: Family Medicine

## 2019-05-14 NOTE — Telephone Encounter (Signed)
Samples ready for pick up.  Patient aware 

## 2019-05-16 ENCOUNTER — Other Ambulatory Visit: Payer: Self-pay

## 2019-05-16 ENCOUNTER — Encounter: Payer: Self-pay | Admitting: Family Medicine

## 2019-05-16 ENCOUNTER — Ambulatory Visit (INDEPENDENT_AMBULATORY_CARE_PROVIDER_SITE_OTHER): Payer: PPO | Admitting: Family Medicine

## 2019-05-16 DIAGNOSIS — M150 Primary generalized (osteo)arthritis: Secondary | ICD-10-CM | POA: Diagnosis not present

## 2019-05-16 DIAGNOSIS — M48062 Spinal stenosis, lumbar region with neurogenic claudication: Secondary | ICD-10-CM

## 2019-05-16 DIAGNOSIS — M8949 Other hypertrophic osteoarthropathy, multiple sites: Secondary | ICD-10-CM

## 2019-05-16 DIAGNOSIS — M159 Polyosteoarthritis, unspecified: Secondary | ICD-10-CM

## 2019-05-16 DIAGNOSIS — N3000 Acute cystitis without hematuria: Secondary | ICD-10-CM

## 2019-05-16 MED ORDER — CEPHALEXIN 500 MG PO CAPS: 500.0000 mg | ORAL_CAPSULE | Freq: Two times a day (BID) | ORAL | 0 refills | Status: AC

## 2019-05-16 MED ORDER — DICLOFENAC SODIUM 1 % TD GEL: 4.0000 g | Freq: Four times a day (QID) | TRANSDERMAL | 2 refills | Status: DC

## 2019-05-16 NOTE — Progress Notes (Signed)
Telephone visit  Subjective: CC: arthritis PCP: Raliegh IpGottschalk, Jujuan Dugo M, DO ZOX:WRUEAHPI:[name] is a 58 y.o. female calls for telephone consult today. Patient provides verbal consent for consult held via phone.  Location of patient: home Location of provider: Working remotely from home Others present for call: none  1.  Osteoarthritis Patient with known spinal stenosis in the low back.  She has joint pain in bilateral knees as well.  She notes that she was being prescribed indomethacin by her orthopedist but this was denied recently because she has not been seen in office.  She notes that she does not want to go to [facility] secondary to COVID-19 restrictions and other concerns.  She is asking that I renew this medicine.  Of note, patient also with chronic kidney disease and history of coronary artery disease.  Pain is refractory to use of muscle relaxer.  She is not taking any medications like Tylenol right now.  Not applying any topicals.  Denies any saddle anesthesia, fecal incontinence or urinary retention.  She goes on to note that she is had a 1 week history of urinary frequency, urgency and dysuria.  Denies any nausea, vomiting or fevers.  No hematuria.   ROS: Per HPI  Allergies  Allergen Reactions  . Morphine And Related Nausea And Vomiting   Past Medical History:  Diagnosis Date  . Arthritis   . CAD (coronary artery disease)    BMS to circumflex 2007 - Dr. Juanda ChanceBrodie  . CKD (chronic kidney disease) stage 3, GFR 30-59 ml/min (HCC)   . Essential hypertension   . Falls frequently   . GERD (gastroesophageal reflux disease)   . HA (headache)   . Hyperlipidemia   . Left-sided face pain   . Morbid obesity (HCC)   . Noncompliance   . OSA (obstructive sleep apnea)    Uses CPAP  . Type 2 diabetes mellitus (HCC)   . Urinary incontinence     Current Outpatient Medications:  .  albuterol (VENTOLIN HFA) 108 (90 Base) MCG/ACT inhaler, Inhale 2 puffs into the lungs every 6 (six) hours as  needed for wheezing or shortness of breath (for rescue)., Disp: 1 Inhaler, Rfl: 0 .  aspirin EC 325 MG tablet, Take 325 mg by mouth every morning. , Disp: , Rfl:  .  colestipol (COLESTID) 1 g tablet, Take 2 tablets (2 g total) by mouth 2 (two) times daily., Disp: 120 tablet, Rfl: 4 .  Diphenhyd-Hydrocort-Nystatin (FIRST-DUKES MOUTHWASH) SUSP, Gargle and spit out 10mL every 6 hours as needed for oral pain., Disp: 300 mL, Rfl: 0 .  empagliflozin (JARDIANCE) 25 MG TABS tablet, Take 25 mg by mouth daily., Disp: 28 tablet, Rfl: 1 .  furosemide (LASIX) 40 MG tablet, Take 1 tablet (40 mg total) by mouth daily as needed for fluid or edema., Disp: 90 tablet, Rfl: 1 .  gabapentin (NEURONTIN) 300 MG capsule, TAKE 2 CAPSULES BY MOUTH ONCE IN THE MORNING, AND 2 CAPSULE AT NOON, AND 2 CAPSULES ONCE IN THE EVENING, Disp: 540 capsule, Rfl: 1 .  glimepiride (AMARYL) 2 MG tablet, Take 1 tablet (2 mg total) by mouth daily before breakfast., Disp: 90 tablet, Rfl: 3 .  glucose blood (ACCU-CHEK AVIVA PLUS) test strip, Use aTest blood sugar once daily, Disp: 100 each, Rfl: 2 .  indomethacin (INDOCIN) 25 MG capsule, Take 25 mg by mouth 3 (three) times daily with meals., Disp: , Rfl:  .  lisinopril (PRINIVIL,ZESTRIL) 10 MG tablet, Take 1 tablet (10 mg total) by mouth  daily., Disp: 90 tablet, Rfl: 3 .  metFORMIN (GLUCOPHAGE) 500 MG tablet, Take 2 tablets (1,000 mg total) by mouth 2 (two) times daily with a meal., Disp: 360 tablet, Rfl: 1 .  metoprolol tartrate (LOPRESSOR) 25 MG tablet, Take 1 tablet (25 mg total) by mouth 2 (two) times daily., Disp: 180 tablet, Rfl: 3 .  nitroGLYCERIN (NITROSTAT) 0.4 MG SL tablet, See AUX Label, Disp: 12 tablet, Rfl: 0 .  omeprazole (PRILOSEC) 40 MG capsule, TAKE 1 CAPSULE BY MOUTH ONCE DAILY 30  MINUTES  BEFORE  A  MEAL, Disp: 90 capsule, Rfl: 0 .  ONETOUCH DELICA LANCETS 40H MISC, Check blood sugar BID and prn  E11.9, Disp: 100 each, Rfl: 11 .  ONETOUCH VERIO test strip, USE 1 STRIP TO  CHECK GLUCOSE TWICE DAILY AND AS NEEDED, Disp: 100 each, Rfl: 5 .  rosuvastatin (CRESTOR) 20 MG tablet, Take 1 tablet (20 mg total) by mouth at bedtime., Disp: 90 tablet, Rfl: 3 .  Tiotropium Bromide-Olodaterol (STIOLTO RESPIMAT) 2.5-2.5 MCG/ACT AERS, Inhale 1 puff into the lungs daily. (Patient not taking: Reported on 02/07/2019), Disp: 1 Inhaler, Rfl: 0 .  tiZANidine (ZANAFLEX) 2 MG tablet, Take 0.5-1 tablets (1-2 mg total) by mouth every 8 (eight) hours as needed for muscle spasms., Disp: 30 tablet, Rfl: 0  Assessment/ Plan: 58 y.o. female   1. Spinal stenosis, lumbar region, with neurogenic claudication Ongoing low back pain and arthritic pains in other joints.  I have prescribed her topical Voltaren gel and advised her to avoid use of oral NSAID given known CKD and CAD.  We discussed the risks of those types of medications.  She is having lower extremity swelling as is and I am sure that this is contributing.  I am placing her on an oral antibiotic as below to cover for urinary tract infection.  Voltaren gel added for arthritic condition.  I have advised her to use scheduled Tylenol arthritis 3 times daily.  We discussed red flag signs and symptoms warranting further evaluation.  I gone ahead and placed a appointment for 48-hour follow-up in office.  If symptoms are not responsive to the above treatments plan for initiation of tramadol to use PRN.  2. Primary osteoarthritis involving multiple joints - diclofenac sodium (VOLTAREN) 1 % GEL; Apply 4 g topically 4 (four) times daily.  Dispense: 400 g; Refill: 2  3. Acute cystitis without hematuria - cephALEXin (KEFLEX) 500 MG capsule; Take 1 capsule (500 mg total) by mouth 2 (two) times daily for 7 days.  Dispense: 14 capsule; Refill: 0   Start time: 3:17pm End time: 3:29pm  Total time spent on patient care (including telephone call/ virtual visit): 18 minutes  [place], [place] [phone]

## 2019-05-17 ENCOUNTER — Telehealth: Payer: Self-pay | Admitting: Family Medicine

## 2019-05-17 ENCOUNTER — Other Ambulatory Visit: Payer: Self-pay

## 2019-05-18 ENCOUNTER — Ambulatory Visit (INDEPENDENT_AMBULATORY_CARE_PROVIDER_SITE_OTHER): Payer: PPO | Admitting: Family Medicine

## 2019-05-18 ENCOUNTER — Ambulatory Visit: Payer: PPO | Admitting: Family Medicine

## 2019-05-18 ENCOUNTER — Encounter: Payer: Self-pay | Admitting: Family Medicine

## 2019-05-18 VITALS — BP 137/75 | HR 69 | Temp 97.8°F | Ht 62.0 in | Wt 308.0 lb

## 2019-05-18 DIAGNOSIS — B373 Candidiasis of vulva and vagina: Secondary | ICD-10-CM

## 2019-05-18 DIAGNOSIS — Z79899 Other long term (current) drug therapy: Secondary | ICD-10-CM | POA: Diagnosis not present

## 2019-05-18 DIAGNOSIS — M5441 Lumbago with sciatica, right side: Secondary | ICD-10-CM | POA: Diagnosis not present

## 2019-05-18 DIAGNOSIS — G8929 Other chronic pain: Secondary | ICD-10-CM

## 2019-05-18 DIAGNOSIS — B3731 Acute candidiasis of vulva and vagina: Secondary | ICD-10-CM

## 2019-05-18 LAB — URINALYSIS, COMPLETE
Bilirubin, UA: NEGATIVE
Ketones, UA: NEGATIVE
Nitrite, UA: NEGATIVE
Protein,UA: NEGATIVE
Specific Gravity, UA: 1.01 (ref 1.005–1.030)
Urobilinogen, Ur: 0.2 mg/dL (ref 0.2–1.0)
pH, UA: 6 (ref 5.0–7.5)

## 2019-05-18 LAB — MICROSCOPIC EXAMINATION: WBC, UA: >30 /hpf — AB (ref 0–5)

## 2019-05-18 MED ORDER — TRAMADOL HCL 50 MG PO TABS: 50.0000 mg | ORAL_TABLET | Freq: Two times a day (BID) | ORAL | 1 refills | Status: AC | PRN

## 2019-05-18 MED ORDER — FLUCONAZOLE 150 MG PO TABS: 150.0000 mg | ORAL_TABLET | Freq: Once | ORAL | 0 refills | Status: AC

## 2019-05-18 NOTE — Progress Notes (Signed)
Subjective: CC: chronic low back pain PCP: Raliegh IpGottschalk, Rennie Rouch M, DO VWU:[id number]:[name] is a 58 y.o. female presenting to clinic today for:  1.  Chronic low back pain Patient with longstanding history of low back pain.  It is become more severe over the last couple of months.  She notes that she has history of back injections and knee injections.  She also has a history of back surgery.  She is established with orthopedics but has been reluctant to go to [facility] secondary to COVID-19 and other social issues.  She was previously prescribed indomethacin but given CKD and increasing lower extremity edema I have recommended against use of this medication. She has been using scheduled Tylenol for the back pain and this does seem to be helping.  Today, pain is a 8/10.  It is a 10/10 with ambulation.  It does radiate to the right lower extremity.  Denies any saddle anesthesia, fecal incontinence or urinary retention.  She does have urinary tract infection symptoms and was prescribed Keflex via tele-visit 2 days ago.  She has yet to pick up this medication.    ROS: Per HPI  Allergies  Allergen Reactions  . Morphine And Related Nausea And Vomiting   Past Medical History:  Diagnosis Date  . Arthritis   . CAD (coronary artery disease)    BMS to circumflex 2007 - Dr. Juanda ChanceBrodie  . CKD (chronic kidney disease) stage 3, GFR 30-59 ml/min (HCC)   . Essential hypertension   . Falls frequently   . GERD (gastroesophageal reflux disease)   . HA (headache)   . Hyperlipidemia   . Left-sided face pain   . Morbid obesity (HCC)   . Noncompliance   . OSA (obstructive sleep apnea)    Uses CPAP  . Type 2 diabetes mellitus (HCC)   . Urinary incontinence     Current Outpatient Medications:  .  albuterol (VENTOLIN HFA) 108 (90 Base) MCG/ACT inhaler, Inhale 2 puffs into the lungs every 6 (six) hours as needed for wheezing or shortness of breath (for rescue)., Disp: 1 Inhaler, Rfl: 0 .  ANORO ELLIPTA 62.5-25  MCG/INH AEPB, , Disp: , Rfl:  .  aspirin EC 325 MG tablet, Take 325 mg by mouth every morning. , Disp: , Rfl:  .  cephALEXin (KEFLEX) 500 MG capsule, Take 1 capsule (500 mg total) by mouth 2 (two) times daily for 7 days., Disp: 14 capsule, Rfl: 0 .  colestipol (COLESTID) 1 g tablet, Take 2 tablets (2 g total) by mouth 2 (two) times daily., Disp: 120 tablet, Rfl: 4 .  diclofenac sodium (VOLTAREN) 1 % GEL, Apply 4 g topically 4 (four) times daily., Disp: 400 g, Rfl: 2 .  Diphenhyd-Hydrocort-Nystatin (FIRST-DUKES MOUTHWASH) SUSP, Gargle and spit out 10mL every 6 hours as needed for oral pain., Disp: 300 mL, Rfl: 0 .  empagliflozin (JARDIANCE) 25 MG TABS tablet, Take 25 mg by mouth daily., Disp: 28 tablet, Rfl: 1 .  furosemide (LASIX) 40 MG tablet, Take 1 tablet (40 mg total) by mouth daily as needed for fluid or edema., Disp: 90 tablet, Rfl: 1 .  gabapentin (NEURONTIN) 300 MG capsule, TAKE 2 CAPSULES BY MOUTH ONCE IN THE MORNING, AND 2 CAPSULE AT NOON, AND 2 CAPSULES ONCE IN THE EVENING, Disp: 540 capsule, Rfl: 1 .  glimepiride (AMARYL) 2 MG tablet, Take 1 tablet (2 mg total) by mouth daily before breakfast., Disp: 90 tablet, Rfl: 3 .  glucose blood (ACCU-CHEK AVIVA PLUS) test strip, Use  aTest blood sugar once daily, Disp: 100 each, Rfl: 2 .  indomethacin (INDOCIN) 25 MG capsule, Take 25 mg by mouth 3 (three) times daily with meals., Disp: , Rfl:  .  lisinopril (PRINIVIL,ZESTRIL) 10 MG tablet, Take 1 tablet (10 mg total) by mouth daily., Disp: 90 tablet, Rfl: 3 .  metFORMIN (GLUCOPHAGE) 500 MG tablet, Take 2 tablets (1,000 mg total) by mouth 2 (two) times daily with a meal., Disp: 360 tablet, Rfl: 1 .  metoprolol tartrate (LOPRESSOR) 25 MG tablet, Take 1 tablet (25 mg total) by mouth 2 (two) times daily., Disp: 180 tablet, Rfl: 3 .  nitroGLYCERIN (NITROSTAT) 0.4 MG SL tablet, See AUX Label, Disp: 12 tablet, Rfl: 0 .  omeprazole (PRILOSEC) 40 MG capsule, TAKE 1 CAPSULE BY MOUTH ONCE DAILY 30  MINUTES   BEFORE  A  MEAL, Disp: 90 capsule, Rfl: 0 .  ONETOUCH DELICA LANCETS 33G MISC, Check blood sugar BID and prn  E11.9, Disp: 100 each, Rfl: 11 .  ONETOUCH VERIO test strip, USE 1 STRIP TO CHECK GLUCOSE TWICE DAILY AND AS NEEDED, Disp: 100 each, Rfl: 5 .  rosuvastatin (CRESTOR) 20 MG tablet, Take 1 tablet (20 mg total) by mouth at bedtime., Disp: 90 tablet, Rfl: 3 .  Tiotropium Bromide-Olodaterol (STIOLTO RESPIMAT) 2.5-2.5 MCG/ACT AERS, Inhale 1 puff into the lungs daily., Disp: 1 Inhaler, Rfl: 0 .  tiZANidine (ZANAFLEX) 2 MG tablet, Take 0.5-1 tablets (1-2 mg total) by mouth every 8 (eight) hours as needed for muscle spasms., Disp: 30 tablet, Rfl: 0 Social History   Socioeconomic History  . Marital status: Married    Spouse name: Chrissie NoaWilliam  . Number of children: 5  . Years of education: 10 th  . Highest education level: 10th grade  Occupational History  . Occupation: disabled     Comment: disabled  Social Needs  . Financial resource strain: Somewhat hard  . Food insecurity    Worry: Sometimes true    Inability: Sometimes true  . Transportation needs    Medical: No    Non-medical: No  Tobacco Use  . Smoking status: Current Every Day Smoker    Packs/day: 1.00    Years: 35.00    Pack years: 35.00    Types: Cigarettes  . Smokeless tobacco: Never Used  Substance and Sexual Activity  . Alcohol use: No    Alcohol/week: 0.0 standard drinks  . Drug use: No  . Sexual activity: Not on file  Lifestyle  . Physical activity    Days per week: 0 days    Minutes per session: 0 min  . Stress: To some extent  Relationships  . Social connections    Talks on phone: More than three times a week    Gets together: Once a week    Attends religious service: Never    Active member of club or organization: No    Attends meetings of clubs or organizations: Never    Relationship status: Married  . Intimate partner violence    Fear of current or ex partner: No    Emotionally abused: No     Physically abused: No    Forced sexual activity: No  Other Topics Concern  . Not on file  Social History Narrative   Patient lives at home with her husband and grandchild [name](William). Patient is disabled.   Patient has 10 th grade education.   Right handed.   Caffeine- one  cup of coffee and  One soda Dr.Pepper/ tea. daily  Family History  Problem Relation Age of Onset  . Coronary artery disease Father   . Cancer - Colon Father   . Diabetes Father   . High Cholesterol Father   . Breast cancer Mother   . Arthritis Sister   . Asthma Sister   . High Cholesterol Daughter   . Thyroid disease Daughter   . Hiatal hernia Son   . Congenital heart disease Son     Objective: Office vital signs reviewed. BP 137/75   Pulse 69   Temp 97.8 F (36.6 C) (Oral)   Ht 5\' 2"  (1.575 m)   Wt (!) 308 lb (139.7 kg)   BMI 56.33 kg/m   Physical Examination:  General: Awake, alert, morbidly obese, No acute distress HEENT: Normal, sclera white Pulm: normal WOB on room air Extremities: warm, well perfused, No pitting edema, cyanosis or clubbing; She has quite a bit of adiposity in bilateral LE.  +2 pulses bilaterally MSK: antlagic gait and station; using cane for ambulation  Lumbar spine: Reduced active range of motion secondary to pain.  She has no midline tenderness palpation but she has quite a bit of paraspinal muscle tenderness palpation starting about at the L1 down to L5 levels.  There is no palpable bony abnormalities.   Neuro: light touch sensation grossly in tact  Results for orders placed or performed in visit on 05/18/19 (from the past 24 hour(s))  Urinalysis, Complete     Status: Abnormal   Collection Time: 05/18/19  9:38 AM  Result Value Ref Range   Specific Gravity, UA 1.010 1.005 - 1.030   pH, UA 6.0 5.0 - 7.5   Color, UA Yellow Yellow   Appearance Ur Hazy (A) Clear   Leukocytes,UA Trace (A) Negative   Protein,UA Negative Negative/Trace   Glucose, UA 3+ (A) Negative    Ketones, UA Negative Negative   RBC, UA Trace (A) Negative   Bilirubin, UA Negative Negative   Urobilinogen, Ur 0.2 0.2 - 1.0 mg/dL   Nitrite, UA Negative Negative   Microscopic Examination See below:    Narrative   Performed at:  [facility] [address] [place], [place]  [id number] Lab Director: Colletta Maryland [facility], Phone:  [phone]  Microscopic Examination     Status: Abnormal   Collection Time: 05/18/19  9:38 AM   URINE  Result Value Ref Range   WBC, UA >30 (A) 0 - 5 /hpf   RBC 0-2 0 - 2 /hpf   Epithelial Cells (non renal) 0-10 0 - 10 /hpf   Bacteria, UA Few None seen/Few   Yeast, UA Present (A) None seen   Narrative   Performed at:  [facility] [address], [place], [place]  [id number] Lab Director: Colletta [facility], Phone:  [phone]   Assessment/ Plan: 58 y.o. female   1. Chronic bilateral low back pain with right-sided sciatica The national cardiac database was reviewed and there were no red flags.  Given history of renal dysfunction I have advised against use of oral NSAIDs.  Okay to continue scheduled Tylenol.  I have added tramadol 50 mg every 12 hours as needed severe pain.  She is aware of the sparing use of the medication.  We discussed the possible side effects and I have recommended that she use a stool softener with each use.  Tox assure was obtained as well as controlled substance contract signed and reviewed today. - traMADol (ULTRAM) 50 MG tablet; Take 1 tablet (50 mg  total) by mouth every 12 (twelve) hours as needed for up to 5 days for severe pain.  Dispense: 60 tablet; Refill: 1 - ToxASSURE Select 13 (MW), Urine - Urinalysis, Complete  2. Controlled substance agreement signed - ToxASSURE Select 13 (MW), Urine  3. Yeast vaginitis Urinalysis was obtained which did demonstrate evidence of infection.  She is to start the Keflex as prescribed.  Have also sent in a Diflucan pill for the yeast noted on urinalysis.  Follow-up  PRN   No orders of the defined types were placed in this encounter.  Meds ordered this encounter  Medications  . traMADol (ULTRAM) 50 MG tablet    Sig: Take 1 tablet (50 mg total) by mouth every 12 (twelve) hours as needed for up to 5 days for severe pain.    Dispense:  60 tablet    Refill:  1    Chronic pain  . fluconazole (DIFLUCAN) 150 MG tablet    Sig: Take 1 tablet (150 mg total) by mouth once for 1 dose.    Dispense:  1 tablet    Refill:  0     Jeffery Gammell Hulen SkainsM Hobson Lax, DO [facility] [phone]

## 2019-05-18 NOTE — Patient Instructions (Signed)
Schedule an appointment with Dr Gladstone Lighter for your back and knee injections.  Controlled Substance Guidelines:  1. You cannot get an early refill, even it is lost.  2. You cannot get pain medications from any other doctor, unless it is the emergency department and related to a new problem or injury.  3. You cannot use alcohol, marijuana, cocaine or any other recreational drugs while using this medication. This is very dangerous.  4. You are willing to have your urine drug tested at each visit.  5. You will not drive while using this medication, because that can put yourself and others in serious danger of an accident. 6. If any medication is stolen, then there must be a police report to verify it, or it cannot be refilled.  7. I will not prescribe these medications for longer than 3 months.  8. You must bring your pill bottle to each visit.  9. You must use the same pharmacy for all refills for the medication, unless you clear it with me beforehand.  10. You cannot share or sell this medication.  11. Always take a stool softener to prevent constipation.

## 2019-05-18 NOTE — Telephone Encounter (Signed)
I spoke to her this am. Ok to see

## 2019-05-22 LAB — TOXASSURE SELECT 13 (MW), URINE

## 2019-06-05 ENCOUNTER — Encounter: Payer: Self-pay | Admitting: Internal Medicine

## 2019-06-05 ENCOUNTER — Ambulatory Visit (INDEPENDENT_AMBULATORY_CARE_PROVIDER_SITE_OTHER): Payer: PPO | Admitting: Internal Medicine

## 2019-06-05 ENCOUNTER — Other Ambulatory Visit: Payer: Self-pay

## 2019-06-05 DIAGNOSIS — F17210 Nicotine dependence, cigarettes, uncomplicated: Secondary | ICD-10-CM

## 2019-06-05 DIAGNOSIS — I10 Essential (primary) hypertension: Secondary | ICD-10-CM

## 2019-06-05 DIAGNOSIS — E1159 Type 2 diabetes mellitus with other circulatory complications: Secondary | ICD-10-CM | POA: Diagnosis not present

## 2019-06-05 DIAGNOSIS — R0600 Dyspnea, unspecified: Secondary | ICD-10-CM

## 2019-06-05 DIAGNOSIS — R0609 Other forms of dyspnea: Secondary | ICD-10-CM

## 2019-06-05 LAB — CBC WITH DIFFERENTIAL/PLATELET
Basophils Absolute: 0.1 10*3/uL (ref 0.0–0.1)
Basophils Relative: 0.8 % (ref 0.0–3.0)
Eosinophils Absolute: 0.8 10*3/uL — ABNORMAL HIGH (ref 0.0–0.7)
Eosinophils Relative: 9.5 % — ABNORMAL HIGH (ref 0.0–5.0)
HCT: 40.4 % (ref 36.0–46.0)
Hemoglobin: 13.2 g/dL (ref 12.0–15.0)
Lymphocytes Relative: 41.6 % (ref 12.0–46.0)
Lymphs Abs: 3.6 10*3/uL (ref 0.7–4.0)
MCHC: 32.7 g/dL (ref 30.0–36.0)
MCV: 94.7 fl (ref 78.0–100.0)
Monocytes Absolute: 0.5 10*3/uL (ref 0.1–1.0)
Monocytes Relative: 5.9 % (ref 3.0–12.0)
Neutro Abs: 3.7 10*3/uL (ref 1.4–7.7)
Neutrophils Relative %: 42.2 % — ABNORMAL LOW (ref 43.0–77.0)
Platelets: 152 10*3/uL (ref 150.0–400.0)
RBC: 4.26 Mil/uL (ref 3.87–5.11)
RDW: 16.7 % — ABNORMAL HIGH (ref 11.5–15.5)
WBC: 8.7 10*3/uL (ref 4.0–10.5)

## 2019-06-05 LAB — BASIC METABOLIC PANEL
BUN: 11 mg/dL (ref 6–23)
CO2: 27 mEq/L (ref 19–32)
Calcium: 9.1 mg/dL (ref 8.4–10.5)
Chloride: 104 mEq/L (ref 96–112)
Creatinine, Ser: 1.13 mg/dL (ref 0.40–1.20)
GFR: 49.1 mL/min — ABNORMAL LOW (ref >60.00)
Glucose, Bld: 131 mg/dL — ABNORMAL HIGH (ref 70–99)
Potassium: 3.6 mEq/L (ref 3.5–5.1)
Sodium: 139 mEq/L (ref 135–145)

## 2019-06-05 LAB — TSH: TSH: 5.31 u[IU]/mL — ABNORMAL HIGH (ref 0.35–4.50)

## 2019-06-05 LAB — BRAIN NATRIURETIC PEPTIDE: Pro B Natriuretic peptide (BNP): 55 pg/mL (ref 0.0–100.0)

## 2019-06-05 MED ORDER — TELMISARTAN 40 MG PO TABS: 40.0000 mg | ORAL_TABLET | Freq: Every day | ORAL | 11 refills | Status: DC

## 2019-06-05 NOTE — Progress Notes (Signed)
Stacey PufferBetty J [name], female    DOB: 12/19/1960,    MRN: [id number]   Brief patient profile:  [age] yowf active smoker with baseline wt around 200 last IUP 1986 winter 2020 gradually worse to point of sob at rest so referred to pulmonary clinic 06/05/2019 by Dr  Nadine CountsGottschalk.    History of Present Illness  06/05/2019  Pulmonary/ 1st office eval/[facility]  Chief Complaint  Patient presents with  . Pulmonary Consult    Referred by Dr. Doylene CanardAshley Gottschalk. Pt c/o DOE x 6 months. She states she can be SOB with or without any exertion. She uses her ventolin inhaler 3-4 x per wk on average.    Dyspnea:  At baseline could to at least an aisle at [employer] but now sob x 50 ft and limited also R leg back  Sob at rest with some strangling / choking  Cough: some at hs c/o dry throat  Sleep: on side with 10 pillows / was on ? cpap x one year  SABA use: helps some x sev hours last used 3 d prior to OV  Uses when goes / never nocturnal       Past Medical History:  Diagnosis Date  . Arthritis   . CAD (coronary artery disease)    BMS to circumflex 2007 - Dr. Juanda ChanceBrodie  . CKD (chronic kidney disease) stage 3, GFR 30-59 ml/min (HCC)   . Essential hypertension   . Falls frequently   . GERD (gastroesophageal reflux disease)   . HA (headache)   . Hyperlipidemia   . Left-sided face pain   . Morbid obesity (HCC)   . Noncompliance   . OSA (obstructive sleep apnea)    Uses CPAP  . Type 2 diabetes mellitus (HCC)   . Urinary incontinence     Outpatient Medications Prior to Visit  Medication Sig Dispense Refill  . albuterol (VENTOLIN HFA) 108 (90 Base) MCG/ACT inhaler Inhale 2 puffs into the lungs every 6 (six) hours as needed for wheezing or shortness of breath (for rescue). 1 Inhaler 0  . aspirin EC 325 MG tablet Take 325 mg by mouth every morning.     . colestipol (COLESTID) 1 g tablet Take 2 tablets (2 g total) by mouth 2 (two) times daily. 120 tablet 4  . diclofenac sodium (VOLTAREN) 1 % GEL Apply 4 g topically 4  (four) times daily. 400 g 2  . empagliflozin (JARDIANCE) 25 MG TABS tablet Take 25 mg by mouth daily. 28 tablet 1  . furosemide (LASIX) 40 MG tablet Take 1 tablet (40 mg total) by mouth daily as needed for fluid or edema. 90 tablet 1  . gabapentin (NEURONTIN) 300 MG capsule TAKE 2 CAPSULES BY MOUTH ONCE IN THE MORNING, AND 2 CAPSULE AT NOON, AND 2 CAPSULES ONCE IN THE EVENING 540 capsule 1  . glimepiride (AMARYL) 2 MG tablet Take 1 tablet (2 mg total) by mouth daily before breakfast. 90 tablet 3  . glucose blood (ACCU-CHEK AVIVA PLUS) test strip Use aTest blood sugar once daily 100 each 2  . indomethacin (INDOCIN) 25 MG capsule Take 25 mg by mouth 3 (three) times daily with meals.    Marland Kitchen. lisinopril (PRINIVIL,ZESTRIL) 10 MG tablet Take 1 tablet (10 mg total) by mouth daily. 90 tablet 3  . metFORMIN (GLUCOPHAGE) 500 MG tablet Take 2 tablets (1,000 mg total) by mouth 2 (two) times daily with a meal. 360 tablet 1  . metoprolol tartrate (LOPRESSOR) 25 MG tablet Take 1 tablet (25 mg  total) by mouth 2 (two) times daily. 180 tablet 3  . nitroGLYCERIN (NITROSTAT) 0.4 MG SL tablet See AUX Label 12 tablet 0  . omeprazole (PRILOSEC) 40 MG capsule TAKE 1 CAPSULE BY MOUTH ONCE DAILY 30  MINUTES  BEFORE  A  MEAL 90 capsule 0  . ONETOUCH DELICA LANCETS 01U MISC Check blood sugar BID and prn  E11.9 100 each 11  . ONETOUCH VERIO test strip USE 1 STRIP TO CHECK GLUCOSE TWICE DAILY AND AS NEEDED 100 each 5  . rosuvastatin (CRESTOR) 20 MG tablet Take 1 tablet (20 mg total) by mouth at bedtime. 90 tablet 3  . tiZANidine (ZANAFLEX) 2 MG tablet Take 0.5-1 tablets (1-2 mg total) by mouth every 8 (eight) hours as needed for muscle spasms. 30 tablet 0  . ANORO ELLIPTA 62.5-25 MCG/INH AEPB     . Diphenhyd-Hydrocort-Nystatin (FIRST-DUKES MOUTHWASH) SUSP Gargle and spit out 67mL every 6 hours as needed for oral pain. 300 mL 0  . Tiotropium Bromide-Olodaterol (STIOLTO RESPIMAT) 2.5-2.5 MCG/ACT AERS Inhale 1 puff into the lungs  daily. 1 Inhaler 0      Objective:     BP 116/60 (BP Location: Left Arm, Cuff Size: Large)   Pulse 71   Temp 97.8 F (36.6 C) (Oral)   Ht 5\' 2"  (1.575 m)   Wt (!) 311 lb (141.1 kg)   SpO2 96%   BMI 56.88 kg/m   SpO2: 96 %  RA    HEENT:  Two bottom teeth poor no others/ nl turbinates bilaterally, and oropharynx. Nl external ear canals without cough reflex   NECK :  without JVD/Nodes/TM/ nl carotid upstrokes bilaterally   LUNGS: no acc muscle use,  Nl contour chest which is clear to A and P bilaterally without cough on insp or exp maneuvers   CV:  RRR  no s3 or murmur or increase in P2, and no edema   ABD:  soft and nontender with nl inspiratory excursion in the supine position. No bruits or organomegaly appreciated, bowel sounds nl  MS:  Nl gait/ ext warm without deformities, calf tenderness, cyanosis or clubbing No obvious joint restrictions   SKIN: warm and dry without lesions    NEURO:  alert, approp, nl sensorium with  no motor or cerebellar deficits apparent.     I personally reviewed images and agree with radiology impression as follows:  CXR:   01/05/2019 No edema or consolidation. Stable cardiac silhouette      Assessment   No problem-specific Assessment & Plan notes found for this encounter.     Christinia Gully, MD 06/05/2019

## 2019-06-05 NOTE — Progress Notes (Signed)
[name], female    DOB: 12-Jul-1961,    MRN: [id number]   Brief patient profile:  60 yowf active smoker with baseline wt around 200 last [place] and progressive wt gain since with doe but since  winter 2020 gradually much  worse to point of sob at rest so referred to pulmonary clinic 06/05/2019 by Dr  Lajuana Ripple no better on various laba/lamas    History of Present Illness  06/05/2019  Pulmonary/ 1st office eval/Wert  Chief Complaint  Patient presents with  . Pulmonary Consult    Referred by Dr. Adam Phenix. Pt c/o DOE x 6 months. She states she can be SOB with or without any exertion. She uses her ventolin inhaler 3-4 x per wk on average.    Dyspnea:  At baseline could to at least an aisle at [place] but now sob x 50 ft and limited also R leg back  Sob at rest with some strangling / choking  Cough: some at hs c/o dry throat  Sleep: on side with 10 pillows / was on ? cpap but off x one year x worse am ha/ ams/ hypersomnolence SABA use: helps some x sev hours last used 3 d prior to OV  Uses when goes / never nocturnal   No obvious day to day or daytime variability or assoc excess/ purulent sputum or mucus plugs or hemoptysis or cp or chest tightness, subjective wheeze or overt sinus or hb symptoms.   Sleeping as above  without nocturnal  or early am exacerbation  of respiratory  c/o's or need for noct saba. Also denies any obvious fluctuation of symptoms with weather or environmental changes or other aggravating or alleviating factors except as outlined above   No unusual exposure hx or h/o childhood pna/ asthma or knowledge of premature birth.  Current Allergies, Complete Past Medical History, Past Surgical History, Family History, and Social History were reviewed in [employer] record.  ROS  The following are not active complaints unless bolded Hoarseness, sore throat, dysphagia, dental problems, itching, sneezing,  nasal congestion or discharge of  excess mucus or purulent secretions, ear ache,   fever, chills, sweats, unintended wt loss or wt gain, classically pleuritic or exertional cp,  orthopnea pnd or arm/hand swelling  or leg swelling, presyncope, palpitations, abdominal pain, anorexia, nausea, vomiting, diarrhea  or change in bowel habits or change in bladder habits, change in stools or change in urine, dysuria, hematuria,  rash, arthralgias, visual complaints, headache, numbness, weakness or ataxia or problems with walking or coordination,  change in mood or  memory.               Past Medical History:  Diagnosis Date  . Arthritis   . CAD (coronary artery disease)    BMS to circumflex 2007 - Dr. Olevia Perches  . CKD (chronic kidney disease) stage 3, GFR 30-59 ml/min (HCC)   . Essential hypertension   . Falls frequently   . GERD (gastroesophageal reflux disease)   . HA (headache)   . Hyperlipidemia   . Left-sided face pain   . Morbid obesity ([place])   . Noncompliance   . OSA (obstructive sleep apnea)    Uses CPAP  . Type 2 diabetes mellitus ([place])   . Urinary incontinence     Outpatient Medications Prior to Visit  Medication Sig Dispense Refill  . albuterol (VENTOLIN HFA) 108 (90 Base) MCG/ACT inhaler Inhale 2 puffs into the lungs every 6 (six) hours as needed  for wheezing or shortness of breath (for rescue). 1 Inhaler 0  . aspirin EC 325 MG tablet Take 325 mg by mouth every morning.     . colestipol (COLESTID) 1 g tablet Take 2 tablets (2 g total) by mouth 2 (two) times daily. 120 tablet 4  . diclofenac sodium (VOLTAREN) 1 % GEL Apply 4 g topically 4 (four) times daily. 400 g 2  . empagliflozin (JARDIANCE) 25 MG TABS tablet Take 25 mg by mouth daily. 28 tablet 1  . furosemide (LASIX) 40 MG tablet Take 1 tablet (40 mg total) by mouth daily as needed for fluid or edema. 90 tablet 1  . gabapentin (NEURONTIN) 300 MG capsule TAKE 2 CAPSULES BY MOUTH ONCE IN THE MORNING, AND 2 CAPSULE AT NOON, AND 2 CAPSULES ONCE IN THE EVENING 540  capsule 1  . glimepiride (AMARYL) 2 MG tablet Take 1 tablet (2 mg total) by mouth daily before breakfast. 90 tablet 3  . glucose blood (ACCU-CHEK AVIVA PLUS) test strip Use aTest blood sugar once daily 100 each 2  . indomethacin (INDOCIN) 25 MG capsule Take 25 mg by mouth 3 (three) times daily with meals.    Marland Kitchen. lisinopril (PRINIVIL,ZESTRIL) 10 MG tablet Take 1 tablet (10 mg total) by mouth daily. 90 tablet 3  . metFORMIN (GLUCOPHAGE) 500 MG tablet Take 2 tablets (1,000 mg total) by mouth 2 (two) times daily with a meal. 360 tablet 1  . metoprolol tartrate (LOPRESSOR) 25 MG tablet Take 1 tablet (25 mg total) by mouth 2 (two) times daily. 180 tablet 3  . nitroGLYCERIN (NITROSTAT) 0.4 MG SL tablet See AUX Label 12 tablet 0  . omeprazole (PRILOSEC) 40 MG capsule TAKE 1 CAPSULE BY MOUTH ONCE DAILY 30  MINUTES  BEFORE  A  MEAL 90 capsule 0  . ONETOUCH DELICA LANCETS 33G MISC Check blood sugar BID and prn  E11.9 100 each 11  . ONETOUCH VERIO test strip USE 1 STRIP TO CHECK GLUCOSE TWICE DAILY AND AS NEEDED 100 each 5  . rosuvastatin (CRESTOR) 20 MG tablet Take 1 tablet (20 mg total) by mouth at bedtime. 90 tablet 3  . tiZANidine (ZANAFLEX) 2 MG tablet Take 0.5-1 tablets (1-2 mg total) by mouth every 8 (eight) hours as needed for muscle spasms. 30 tablet 0  . ANORO ELLIPTA 62.5-25 MCG/INH AEPB     . Diphenhyd-Hydrocort-Nystatin (FIRST-DUKES MOUTHWASH) SUSP Gargle and spit out 10mL every 6 hours as needed for oral pain. 300 mL 0  .    1 Inhaler 0      Objective:     BP 116/60 (BP Location: Left Arm, Cuff Size: Large)   Pulse 71   Temp 97.8 F (36.6 C) (Oral)   Ht 5\' 2"  (1.575 m)   Wt (!) 311 lb (141.1 kg)   SpO2 96%   BMI 56.88 kg/m   SpO2: 96 %  RA    Obese mod hoarse amb wf nad   HEENT:  Two bottom teeth poor no others/ nl turbinates bilaterally, and oropharynx. Nl external ear canals without cough reflex   NECK :  without JVD/Nodes/TM/ nl carotid upstrokes bilaterally   LUNGS: no  acc muscle use,  Nl contour chest which is clear to A and P bilaterally without cough on insp or exp maneuvers   CV:  RRR  no s3 or murmur or increase in P2, and 1+ ptting edema both LE's   ABD: quite obese/  soft and nontender with nl inspiratory excursion in the  supine position. No bruits or organomegaly appreciated, bowel sounds nl  MS:  Nl gait/ ext warm without deformities, calf tenderness, cyanosis or clubbing No obvious joint restrictions   SKIN: warm and dry without lesions    NEURO:  alert, approp, nl sensorium with  no motor or cerebellar deficits apparent.     I personally reviewed images and agree with radiology impression as follows:  CXR:   01/05/2019 No edema or consolidation. Stable cardiac silhouette   Labs ordered/ reviewed:      Chemistry      Component Value Date/Time   NA 139 06/05/2019 1509   NA 143 11/23/2018 1044   K 3.6 06/05/2019 1509   CL 104 06/05/2019 1509   CO2 27 06/05/2019 1509   BUN 11 06/05/2019 1509   BUN 14 11/23/2018 1044   CREATININE 1.13 06/05/2019 1509      Component Value Date/Time   CALCIUM 9.1 06/05/2019 1509                              Lab Results  Component Value Date   WBC 8.7 06/05/2019   HGB 13.2 06/05/2019   HCT 40.4 06/05/2019   MCV 94.7 06/05/2019   PLT 152.0 06/05/2019        EOS                                                              0.8                                    06/05/2019     Lab Results  Component Value Date   TSH 5.31 (H) 06/05/2019     Lab Results  Component Value Date   PROBNP 55.0 06/05/2019             Assessment   DOE (dyspnea on exertion) Actve smoker Right heart catheterization August 2012: Right atrial pressure was 3, right ventricular pressure 28/7 with a PA pressure of 24/13 and a mean of 17 mm of mercury wedge pressure was 10 mmHg Fick cardiac output was 5.8 with a PVR of 2.4 with units. PA saturation was 68%. The study was essentially within normal limits. - try  off ACEi 06/05/2019    Symptoms of sob at rest assoc with choking sensation= Symptoms are markedly disproportionate to objective findings and not clear to what extent this is actually a pulmonary  problem but pt does appear to have difficult to sort out respiratory symptoms of unknown origin for which  DDX  = almost all start with A and  include Adherence, Ace Inhibitors, Acid Reflux, Active Sinus Disease, Alpha 1 Antitripsin deficiency, Anxiety masquerading as Airways dz,  ABPA,  Allergy(esp in young), Aspiration (esp in elderly), Adverse effects of meds,  Active smoking or Vaping, A bunch of PE's/clot burden (a few small clots can't cause this syndrome unless there is already severe underlying pulm or vascular dz with poor reserve),  Anemia or thyroid disorder, plus two Bs  = Bronchiectasis and Beta blocker use..and one C= CHF   Adherence is always the initial "prime suspect" and is a multilayered concern that requires a "trust  but verify" approach in every patient - starting with knowing how to use medications, especially inhalers, correctly, keeping up with refills and understanding the fundamental difference between maintenance and prns vs those medications only taken for a very short course and then stopped and not refilled.  - device teaching done  - return with all meds in hand using a trust but verify approach to confirm accurate Medication  Reconciliation The principal here is that until we are certain that the  patients are doing what we've asked, it makes no sense to ask them to do more.   ACEi adverse effects at the  top of the usual list of suspects and the only way to rule it out is a trial off > see hbp a/p    Active smoker also near top  ?allergy/asthma component > note elevated EOS so need to keep in mind and ? Change to trelegy on return if not better  ? Anxiety/depression/ deconditioning > usually at the bottom of this list of usual suspects but should be included  and may interfere  with adherence, ability to lose wt and stop smoking  and also interpretation of response or lack thereof to symptom management which can be quite subjective> defer to PCP  ? Anemia / thyroid dz  > tsh noted, may need to be started on rx  ? BB effects > unlikely on low dose lopressor   ? chf > prob excluded with low bnp though can be falsely reduced in [place]    >>> return in 6 weeks with pfts to assess ? Severity copd        Hypertension associated with diabetes (HCC) Changed acei to arb due to unexplained sob at rest with choking sensation   In the best review of chronic cough to date ( NEJM 2016 375 1610-9604) ,  ACEi are now felt to cause cough in up to  20% of pts which is a 4 fold increase from previous reports and does not include the variety of non-specific complaints we see in pulmonary clinic in pts on ACEi but previously attributed to another dx like  Copd/asthma and  include PNDS, throat and chest congestion, "bronchitis", unexplained dyspnea and noct "strangling" sensations, and hoarseness, but also  atypical /refractory GERD symptoms like dysphagia and "bad heartburn"   The only way I know  to prove this is not an "ACEi Case" is a trial off ACEi x a minimum of 6 weeks then regroup.   >>>  Try micardis 40 mg daily      Cigarette smoker Counseled re importance of smoking cessation but did not meet time criteria for separate billing       Morbid (severe) obesity due to excess calories (HCC) Body mass index is 56.88 kg/m.    Lab Results  Component Value Date   TSH 5.31 (H) 06/05/2019     Contributing to gerd risk/ doe/reviewed the need and the process to achieve and maintain neg calorie balance > defer f/u primary care including intermittently monitoring thyroid status      Total time devoted to counseling  > 50 % of initial 60 min office visit:  review case with pt/ performed device teaching  using a teach back technique which also  extended face to face time for  this visit (see above) discussion of options/alternatives/ personally creating written customized instructions  in presence of pt  then going over those specific  Instructions directly with the pt including how to use all of  the meds but in particular covering each new medication in detail and the difference between the maintenance= "automatic" meds and the prns using an action plan format for the latter (If this problem/symptom => do that organization reading Left to right).  Please see AVS from this visit for a full list of these instructions which I personally wrote for this pt and  are unique to this visit.      Sandrea HughsMichael Wert, MD 06/05/2019

## 2019-06-05 NOTE — Patient Instructions (Addendum)
Only use your albuterol as a rescue medication to be used if you can't catch your breath by resting or doing a relaxed purse lip breathing pattern.  - The less you use it, the better it will work when you need it. - Ok to use up to 2 puffs  every 4 hours if you must but call for immediate appointment if use goes up over your usual need - Don't leave home without it !!  (think of it like the spare tire for your car)   Work on inhaler technique:  relax and gently blow all the way out then take a nice smooth deep breath back in, triggering the inhaler at same time you start breathing in.  Hold for up to 5 seconds if you can.     Stop lisinopril (zestroil) and replace it with micardis 40 mg one daily   Change omeprazole (prilosec) to take Take 30-60 min before first meal of the day   Please remember to go to the lab department   for your tests - we will call you with the results when they are available.  The key is to stop smoking completely before smoking completely stops you!        Please schedule a follow up office visit in 6 weeks, call sooner if needed  - ? Repeat tsh/ pfts on return

## 2019-06-06 ENCOUNTER — Encounter: Payer: Self-pay | Admitting: Internal Medicine

## 2019-06-06 NOTE — Assessment & Plan Note (Signed)
Body mass index is 56.88 kg/m.    Lab Results  Component Value Date   TSH 5.31 (H) 06/05/2019     Contributing to gerd risk/ doe/reviewed the need and the process to achieve and maintain neg calorie balance > defer f/u primary care including intermittently monitoring thyroid status      Total time devoted to counseling  > 50 % of initial 60 min office visit:  review case with pt/ performed device teaching  using a teach back technique which also  extended face to face time for this visit (see above) discussion of options/alternatives/ personally creating written customized instructions  in presence of pt  then going over those specific  Instructions directly with the pt including how to use all of the meds but in particular covering each new medication in detail and the difference between the maintenance= "automatic" meds and the prns using an action plan format for the latter (If this problem/symptom => do that organization reading Left to right).  Please see AVS from this visit for a full list of these instructions which I personally wrote for this pt and  are unique to this visit.

## 2019-06-06 NOTE — Assessment & Plan Note (Signed)
Counseled re importance of smoking cessation but did not meet time criteria for separate billing   °

## 2019-06-06 NOTE — Progress Notes (Signed)
mychart note sent

## 2019-06-06 NOTE — Assessment & Plan Note (Signed)
Changed acei to arb due to unexplained sob at rest with choking sensation   In the best review of chronic cough to date ( NEJM 2016 375 1544-1551) ,  ACEi are now felt to cause cough in up to  20% of pts which is a 4 fold increase from previous reports and does not include the variety of non-specific complaints we see in pulmonary clinic in pts on ACEi but previously attributed to another dx like  Copd/asthma and  include PNDS, throat and chest congestion, "bronchitis", unexplained dyspnea and noct "strangling" sensations, and hoarseness, but also  atypical /refractory GERD symptoms like dysphagia and "bad heartburn"   The only way I know  to prove this is not an "ACEi Case" is a trial off ACEi x a minimum of 6 weeks then regroup.   >>>  Try micardis 40 mg daily

## 2019-06-06 NOTE — Assessment & Plan Note (Addendum)
Actve smoker Right heart catheterization August 2012: Right atrial pressure was 3, right ventricular pressure 28/7 with a PA pressure of 24/13 and a mean of 17 mm of mercury wedge pressure was 10 mmHg Fick cardiac output was 5.8 with a PVR of 2.4 with units. PA saturation was 68%. The study was essentially within normal limits. - try off ACEi 06/05/2019    Symptoms of sob at rest assoc with choking sensation= Symptoms are markedly disproportionate to objective findings and not clear to what extent this is actually a pulmonary  problem but pt does appear to have difficult to sort out respiratory symptoms of unknown origin for which  DDX  = almost all start with A and  include Adherence, Ace Inhibitors, Acid Reflux, Active Sinus Disease, Alpha 1 Antitripsin deficiency, Anxiety masquerading as Airways dz,  ABPA,  Allergy(esp in young), Aspiration (esp in elderly), Adverse effects of meds,  Active smoking or Vaping, A bunch of PE's/clot burden (a few small clots can't cause this syndrome unless there is already severe underlying pulm or vascular dz with poor reserve),  Anemia or thyroid disorder, plus two Bs  = Bronchiectasis and Beta blocker use..and one C= CHF   Adherence is always the initial "prime suspect" and is a multilayered concern that requires a "trust but verify" approach in every patient - starting with knowing how to use medications, especially inhalers, correctly, keeping up with refills and understanding the fundamental difference between maintenance and prns vs those medications only taken for a very short course and then stopped and not refilled.  - device teaching done  - return with all meds in hand using a trust but verify approach to confirm accurate Medication  Reconciliation The principal here is that until we are certain that the  patients are doing what we've asked, it makes no sense to ask them to do more.   ACEi adverse effects at the  top of the usual list of suspects and the only  way to rule it out is a trial off > see hbp a/p    Active smoker also near top  ?allergy/asthma component > note elevated EOS so need to keep in mind and ? Change to trelegy on return if not better  ? Anxiety/depression/ deconditioning > usually at the bottom of this list of usual suspects but should be included  and may interfere with adherence, ability to lose wt and stop smoking  and also interpretation of response or lack thereof to symptom management which can be quite subjective> defer to PCP  ? Anemia / thyroid dz  > tsh noted, may need to be started on rx  ? BB effects > unlikely on low dose lopressor   ? chf > prob excluded with low bnp though can be falsely reduced in [place]    >>> return in 6 weeks with pfts to assess ? Severity copd

## 2019-06-13 ENCOUNTER — Other Ambulatory Visit: Payer: Self-pay | Admitting: Family Medicine

## 2019-06-13 DIAGNOSIS — K219 Gastro-esophageal reflux disease without esophagitis: Secondary | ICD-10-CM

## 2019-06-25 ENCOUNTER — Telehealth: Payer: Self-pay | Admitting: Family Medicine

## 2019-06-25 NOTE — Telephone Encounter (Signed)
Pt aware - samples up front  

## 2019-07-03 ENCOUNTER — Telehealth: Payer: Self-pay | Admitting: Pharmacist

## 2019-07-03 NOTE — Patient Outreach (Addendum)
[facility]) Care Management  07/03/2019  TANEYA CONKEL 1961-10-05 [id number]   Patient was called regarding medication assistance. Unfortunately, patient did not answer the phone. HIPAA compliant message was left on her voicemail.  Plan: Call patient back in 3-5 business days. Send patient an unsuccessful Economist.  Elayne Guerin, PharmD, [place] Clinical Pharmacist [phone]

## 2019-07-06 ENCOUNTER — Other Ambulatory Visit: Payer: Self-pay | Admitting: Pharmacist

## 2019-07-06 NOTE — Patient Outreach (Signed)
[facility] [facility]) Care Management  [place]   07/06/2019  [name] 07/30/1961 [id number]  Reason for referral: medication assistance  Referral source: [facility]  Referral medication(s): Jardiance   Current insurance: [employer]  PMHx:  58 year old female with multiple medical conditions including but not limitied to:  Coronary Artery disease, Type 2 diabetes, Arthritis, anxiety, GERD, hyperlipidemia, Hypertension, morbid obesity, and OSA.  Objective: Allergies  Allergen Reactions  . Morphine And Related Nausea And Vomiting    Medications Reviewed Today    Reviewed by Elayne Guerin, [facility] (Pharmacist) on 07/06/19 at 1503  Med List Status: <None>  Medication Order Taking? Sig Documenting Provider Last Dose Status Informant  acetaminophen (TYLENOL) 650 MG CR tablet [id number] Yes Take 650 mg by mouth every 8 (eight) hours as needed for pain. [provider] Taking Active   albuterol (VENTOLIN HFA) 108 (90 Base) MCG/ACT inhaler [id number] Yes Inhale 2 puffs into the lungs every 6 (six) hours as needed for wheezing or shortness of breath (for rescue). Ronnie Doss M, DO Taking Active   aspirin EC 325 MG tablet [id number] Yes Take 325 mg by mouth every morning.  [provider] Taking Active Self           Med Note Trinidad Curet, Celedonio Savage   Fri Aug 22, 2015 10:57 PM)    colestipol (COLESTID) 1 g tablet [id number] Yes Take 2 tablets (2 g total) by mouth 2 (two) times daily. Ronnie Doss M, DO Taking Active   diclofenac sodium (VOLTAREN) 1 % GEL [id number] Yes Apply 4 g topically 4 (four) times daily. Ronnie Doss M, DO Taking Active   empagliflozin (JARDIANCE) 25 MG TABS tablet [id number] Yes Take 25 mg by mouth daily. Terald Sleeper, PA-C Taking Active   furosemide (LASIX) 40 MG tablet [id number] Yes Take 1 tablet (40 mg total) by mouth daily as needed for fluid or edema. Ronnie Doss M, DO Taking Active   gabapentin (NEURONTIN) 300  MG capsule [id number] Yes TAKE 2 CAPSULES BY MOUTH ONCE IN THE MORNING, AND 2 CAPSULE AT NOON, AND 2 CAPSULES ONCE IN THE EVENING Eustaquio Maize, MD Taking Active   glimepiride (AMARYL) 2 MG tablet [id number] Yes Take 1 tablet (2 mg total) by mouth daily before breakfast. Ronnie Doss M, DO Taking Active   glucose blood (ACCU-CHEK AVIVA PLUS) test strip [id number] Yes Use aTest blood sugar once daily Chevis Pretty, FNP Taking Active   metFORMIN (GLUCOPHAGE) 500 MG tablet [id number] Yes Take 2 tablets (1,000 mg total) by mouth 2 (two) times daily with a meal. Ronnie Doss M, DO Taking Active   metoprolol tartrate (LOPRESSOR) 25 MG tablet [id number] Yes Take 1 tablet (25 mg total) by mouth 2 (two) times daily. Ronnie Doss M, DO Taking Active   nitroGLYCERIN (NITROSTAT) 0.4 MG SL tablet [id number] Yes See AUX Label Eustaquio Maize, MD Taking Active         Discontinued 06/14/19 1200 (Reorder)   omeprazole (PRILOSEC) 40 MG capsule [id number] Yes TAKE 1 CAPSULE BY MOUTH ONCE DAILY 30 MINUTES BEFORE A MEAL Janora Norlander, DO Taking Active   [facility] DELICA LANCETS 78L MISC [id number] Yes Check blood sugar BID and prn  E11.9 Chevis Pretty, FNP Taking Active   [facility] VERIO test strip [id number] Yes USE 1 STRIP TO CHECK GLUCOSE TWICE DAILY AND AS NEEDED Ronnie Doss M, DO Taking Active   rosuvastatin (CRESTOR) 20 MG tablet [id number] Yes Take 1 tablet (20 mg total)  by mouth at bedtime. Delynn FlavinGottschalk, Ashly M, DO Taking Active   telmisartan (MICARDIS) 40 MG tablet [id number] Yes Take 1 tablet (40 mg total) by mouth daily. Nyoka CowdenWert, Michael B, MD Taking Active          Assessment:  Drugs sorted by system:  Neurologic/Psychologic: Gabapentin  Cardiovascular: Aspirin, Colestipol, Furosemide,Metoprolol, Nitroglycerin, Micardis, Rosuvastatin  Gastrointestinal: Omeprazole,   Endocrine: Jardiance, Glimepiride, Metformin  Topical: Diclofenac Gel  Pain: Acetaminophen 650  mg  Medication Review Findings:  . HgA1c- 7.4% 2/20  o Jardiance adherence issues due to cost. (Patient reported >$90) . On statin -Rosuvastatin-last filled 05/14/19 for a 90 day supply  Medication Assistance Findings:  Medication assistance needs identified: Jardiance   Patient appears to qualify to receive Jardiance from [employer] Patient Assistance Program.    Her income was very close to the Extra Help Cut off, as such, an Extra Help Application was completed with the patient over the phone.  Additional medication assistance options reviewed with patient as warranted:  No other options identified  Plan: I will route patient assistance letter to [facility] pharmacy technician who will coordinate patient assistance program application process for medications listed above.  [facility] pharmacy technician will assist with obtaining all required documents from both patient and provider(s) and submit application(s) once completed.    Route note to PCP Follow up with patient in 4 weeks.   Beecher McardleKatina J. Bethel Sirois, PharmD, BCACP [facility] Clinical Pharmacist [phone]

## 2019-07-07 DIAGNOSIS — M545 Low back pain: Secondary | ICD-10-CM | POA: Diagnosis not present

## 2019-07-07 DIAGNOSIS — M25561 Pain in right knee: Secondary | ICD-10-CM | POA: Diagnosis not present

## 2019-07-10 ENCOUNTER — Other Ambulatory Visit: Payer: Self-pay | Admitting: Pharmacy Technician

## 2019-07-10 NOTE — Patient Outreach (Signed)
[place] [facility]) Care Management  07/10/2019  KRISI AZUA [birth year] [id number]                                        Medication Assistance Referral  Referral From: [place]  Medication/Company: Vania Rea / BI Patient application portion:  Mailed Provider application portion: Faxed  to [employer] Provider address/fax verified via: Call to office & website     Follow up:  Will follow up with patient in 5-10 business days to confirm application(s) have been received.  Kiren Mcisaac P. Elie Gragert, [place] Management [phone]

## 2019-07-12 ENCOUNTER — Other Ambulatory Visit: Payer: Self-pay

## 2019-07-13 ENCOUNTER — Ambulatory Visit: Payer: PPO | Admitting: Family Medicine

## 2019-07-17 ENCOUNTER — Ambulatory Visit: Payer: PPO | Admitting: Internal Medicine

## 2019-07-18 ENCOUNTER — Other Ambulatory Visit: Payer: Self-pay | Admitting: Pharmacy Technician

## 2019-07-18 NOTE — Patient Outreach (Signed)
[place] [facility]) Care Management  07/18/2019  [name] December 22, 1960 [id number]  Successful outreach call placed to patient in regards to [facility] application for [place].  Spoke to patient, HIPAA identifiers verified.  Patient informed she received the application in the mail but had not mailed it back to [place] yet. She informed she was having to search for her proof of income. Discussed the options for income verification which include items such as 1040 tax return, social security award statements, tax statement 1099 or bank account showing deposits of social security. Patient verbalized understanding.  Will followup with patient in 10-15 business days if application has not been received back.  Arihant Pennings P. Gavynn Duvall, [place] Management [phone]

## 2019-07-27 ENCOUNTER — Other Ambulatory Visit: Payer: Self-pay

## 2019-07-30 ENCOUNTER — Ambulatory Visit (INDEPENDENT_AMBULATORY_CARE_PROVIDER_SITE_OTHER): Payer: PPO | Admitting: Family Medicine

## 2019-07-30 ENCOUNTER — Encounter: Payer: Self-pay | Admitting: Family Medicine

## 2019-07-30 ENCOUNTER — Other Ambulatory Visit: Payer: Self-pay

## 2019-07-30 VITALS — BP 126/70 | HR 79 | Temp 97.8°F | Ht 62.0 in | Wt 313.0 lb

## 2019-07-30 DIAGNOSIS — M48062 Spinal stenosis, lumbar region with neurogenic claudication: Secondary | ICD-10-CM | POA: Diagnosis not present

## 2019-07-30 DIAGNOSIS — E785 Hyperlipidemia, unspecified: Secondary | ICD-10-CM

## 2019-07-30 DIAGNOSIS — E1142 Type 2 diabetes mellitus with diabetic polyneuropathy: Secondary | ICD-10-CM

## 2019-07-30 DIAGNOSIS — E1169 Type 2 diabetes mellitus with other specified complication: Secondary | ICD-10-CM | POA: Diagnosis not present

## 2019-07-30 LAB — BAYER DCA HB A1C WAIVED: HB A1C (BAYER DCA - WAIVED): 8.1 % — ABNORMAL HIGH (ref <7.0)

## 2019-07-30 NOTE — Patient Instructions (Signed)
You should have refills on Tramadol at [facility] for your back.  Use sparingly.  Your sugar is HIGH today.  This is probably some to do with the steroids.  No changes for now but watch what you are eating.  Monitor your sugar daily and see me in 3 weeks for recheck.

## 2019-07-30 NOTE — Progress Notes (Signed)
Subjective: CC: Chronic low back pain, type 2 diabetes PCP: Janora Norlander, DO [name] is a 58 y.o. female presenting to clinic today for:  1.  Type 2 Diabetes with hypertension and hyperlipidemia:  Patient reports that she is only intermittently checking blood sugars but they have been ranging anywhere between 120s and 250s.  The 250s seem to be more recent after she had knee injections and prednisone Dosepak for chronic pain.  She reports compliance with the Jardiance 25 mg daily, Amaryl 2 mg daily and metformin 1000 mg twice daily.  She is also compliant with her antihypertensives of Micardis 40 mg daily, Lopressor 25 mg twice daily and cholesterol medicine Crestor 20 mg daily.  Last eye exam: Needs Last foot exam: Needs Last A1c:  Lab Results  Component Value Date   HGBA1C 7.4 (H) 01/05/2019   Nephropathy screen indicated?:  On ARB Last flu, zoster and/or pneumovax:  Immunization History  Administered Date(s) Administered  . Influenza,inj,Quad PF,6+ Mos 08/10/2013, 08/28/2014, 09/25/2015, 11/23/2017, 11/27/2018  . Pneumococcal Conjugate-13 11/27/2018    ROS: Denies dizziness, CP, SOB.    2.  Chronic low back pain Patient with known bilateral low back pain with right-sided sciatica.  She was last seen in July for this.  She notes that the tramadol has been used sparingly but she was only given a one-week supply from the pharmacy even though it was written for a month supply.  She was unaware that she had remaining refills.  She was seen by orthopedist recently who recommended that she get a refill on this since it was helpful.  She denies any falls or dizziness.  ROS: Per HPI  Allergies  Allergen Reactions  . Morphine And Related Nausea And Vomiting   Past Medical History:  Diagnosis Date  . Arthritis   . CAD (coronary artery disease)    BMS to circumflex 2007 - Dr. Olevia Perches  . CKD (chronic kidney disease) stage 3, GFR 30-59 ml/min (HCC)   . Essential  hypertension   . Falls frequently   . GERD (gastroesophageal reflux disease)   . HA (headache)   . Hyperlipidemia   . Left-sided face pain   . Morbid obesity ([place])   . Noncompliance   . OSA (obstructive sleep apnea)    Uses CPAP  . Type 2 diabetes mellitus ([place])   . Urinary incontinence     Current Outpatient Medications:  .  acetaminophen (TYLENOL) 650 MG CR tablet, Take 650 mg by mouth every 8 (eight) hours as needed for pain., Disp: , Rfl:  .  albuterol (VENTOLIN HFA) 108 (90 Base) MCG/ACT inhaler, Inhale 2 puffs into the lungs every 6 (six) hours as needed for wheezing or shortness of breath (for rescue)., Disp: 1 Inhaler, Rfl: 0 .  aspirin EC 325 MG tablet, Take 325 mg by mouth every morning. , Disp: , Rfl:  .  colestipol (COLESTID) 1 g tablet, Take 2 tablets (2 g total) by mouth 2 (two) times daily., Disp: 120 tablet, Rfl: 4 .  diclofenac sodium (VOLTAREN) 1 % GEL, Apply 4 g topically 4 (four) times daily., Disp: 400 g, Rfl: 2 .  empagliflozin (JARDIANCE) 25 MG TABS tablet, Take 25 mg by mouth daily., Disp: 28 tablet, Rfl: 1 .  furosemide (LASIX) 40 MG tablet, Take 1 tablet (40 mg total) by mouth daily as needed for fluid or edema., Disp: 90 tablet, Rfl: 1 .  gabapentin (NEURONTIN) 300 MG capsule, TAKE 2 CAPSULES BY MOUTH ONCE IN  THE MORNING, AND 2 CAPSULE AT NOON, AND 2 CAPSULES ONCE IN THE EVENING, Disp: 540 capsule, Rfl: 1 .  glimepiride (AMARYL) 2 MG tablet, Take 1 tablet (2 mg total) by mouth daily before breakfast., Disp: 90 tablet, Rfl: 3 .  metFORMIN (GLUCOPHAGE) 500 MG tablet, Take 2 tablets (1,000 mg total) by mouth 2 (two) times daily with a meal., Disp: 360 tablet, Rfl: 1 .  metoprolol tartrate (LOPRESSOR) 25 MG tablet, Take 1 tablet (25 mg total) by mouth 2 (two) times daily., Disp: 180 tablet, Rfl: 3 .  nitroGLYCERIN (NITROSTAT) 0.4 MG SL tablet, See AUX Label, Disp: 12 tablet, Rfl: 0 .  omeprazole (PRILOSEC) 40 MG capsule, TAKE 1 CAPSULE BY MOUTH ONCE DAILY 30 MINUTES  BEFORE A MEAL, Disp: 90 capsule, Rfl: 0 .  ONETOUCH DELICA LANCETS 33G MISC, Check blood sugar BID and prn  E11.9, Disp: 100 each, Rfl: 11 .  ONETOUCH VERIO test strip, USE 1 STRIP TO CHECK GLUCOSE TWICE DAILY AND AS NEEDED, Disp: 100 each, Rfl: 5 .  rosuvastatin (CRESTOR) 20 MG tablet, Take 1 tablet (20 mg total) by mouth at bedtime., Disp: 90 tablet, Rfl: 3 .  telmisartan (MICARDIS) 40 MG tablet, Take 1 tablet (40 mg total) by mouth daily., Disp: 30 tablet, Rfl: 11 Social History   Socioeconomic History  . Marital status: Married    Spouse name: [name]  . Number of children: 5  . Years of education: 10 th  . Highest education level: 10th grade  Occupational History  . Occupation: disabled     Comment: disabled  Social Needs  . Financial resource strain: Somewhat hard  . Food insecurity    Worry: Sometimes true    Inability: Sometimes true  . Transportation needs    Medical: No    Non-medical: No  Tobacco Use  . Smoking status: Current Every Day Smoker    Packs/day: 1.00    Years: 47.00    Pack years: 47.00    Types: Cigarettes    Start date: 05/14/1972  . Smokeless tobacco: Never Used  Substance and Sexual Activity  . Alcohol use: No    Alcohol/week: 0.0 standard drinks  . Drug use: No  . Sexual activity: Not on file  Lifestyle  . Physical activity    Days per week: 0 days    Minutes per session: 0 min  . Stress: To some extent  Relationships  . Social connections    Talks on phone: More than three times a week    Gets together: Once a week    Attends religious service: Never    Active member of club or organization: No    Attends meetings of clubs or organizations: Never    Relationship status: Married  . Intimate partner violence    Fear of current or ex partner: No    Emotionally abused: No    Physically abused: No    Forced sexual activity: No  Other Topics Concern  . Not on file  Social History Narrative   Patient lives at home with her husband and  grandchild [name]). Patient is disabled.   Patient has 10 th grade education.   Right handed.   Caffeine- one  cup of coffee and  One soda Dr.Pepper/ tea. daily   Family History  Problem Relation Age of Onset  . Coronary artery disease Father   . Cancer - Colon Father   . Diabetes Father   . High Cholesterol Father   . Breast cancer Mother   .  Arthritis Sister   . Asthma Sister   . High Cholesterol Daughter   . Thyroid disease Daughter   . Hiatal hernia Son   . Congenital heart disease Son     Objective: Office vital signs reviewed. BP 126/70   Pulse 79   Temp 97.8 F (36.6 C) (Temporal)   Ht 5\' 2"  (1.575 m)   Wt (!) 313 lb (142 kg)   SpO2 97%   BMI 57.25 kg/m   Physical Examination:  General: Awake, alert, morbidly obese, No acute distress HEENT: Normal, sclera white, MMM Cardio: regular rate and rhythm, S1S2 heard, no murmurs appreciated Pulm: clear to auscultation bilaterally, no wheezes, rhonchi or rales; normal work of breathing on room air Extremities: warm, well perfused, nonpitting LE edema. No cyanosis or clubbing; +1 pulses bilaterally MSK: antalgic gait, uses cane for ambulation Neuro: see DM foot Diabetic Foot Exam - Simple   Simple Foot Form Diabetic Foot exam was performed with the following findings: Yes 07/30/2019  5:05 PM  Visual Inspection See comments: Yes Sensation Testing See comments: Yes Pulse Check Posterior Tibialis and Dorsalis pulse intact bilaterally: Yes Comments +1 pedal pulses bilaterally.  She has quite a bit of nonpitting edema in bilateral lower extremities and subsequently has venous stasis changes to the skin bilaterally.  She has essentially absent monofilament and vibratory sensation bilateral feet.  No skin breakdown or ulcerations noted.  No pre-ulcerative calluses noted.     Assessment/ Plan: 58 y.o. female   1. Type 2 diabetes mellitus with diabetic polyneuropathy, without long-term current use of insulin (HCC) Not  controlled.  A1c has gone up to 8.1 since our last visit.  I wonder how much of the steroids that she took recently has impacted this.  Will allow for complete washout of steroid and have her follow-up in about 3 weeks via telephone visit to review her fasting blood sugars.  She will monitor these daily and record.  May need to consider increasing Amaryl versus addition of another medication.  Goal for this patient is A1c of 7 or less. - Bayer DCA Hb A1c Waived  2. Hyperlipidemia associated with type 2 diabetes mellitus (HCC) Continue statin  3. Spinal stenosis, lumbar region, with neurogenic claudication Has tramadol refills.   Orders Placed This Encounter  Procedures  . Bayer DCA Hb A1c Waived   No orders of the defined types were placed in this encounter.    Raliegh IpAshly M Melora Menon, DO [facility] [phone]

## 2019-08-03 ENCOUNTER — Other Ambulatory Visit: Payer: Self-pay | Admitting: Pharmacy Technician

## 2019-08-03 NOTE — Patient Outreach (Signed)
[place] [facility]) Care Management  08/03/2019  [name] 01-22-1961 [id number]    Successful outreach call placed to patient in regards to [facility] application for Jardiance.  Spoke to patient, HIPAA identifiers verified.  Patient informed she has not mailed back the application. She had misplaced it but she found it again. She informed she will be working on it today and would place in mail either today or tomorrow.  Will followup with patient in 10-15 business days if application has not been received back.  [name] P. Kolt Mcwhirter, [place] Management [phone]

## 2019-08-14 ENCOUNTER — Other Ambulatory Visit: Payer: Self-pay | Admitting: Pharmacy Technician

## 2019-08-14 ENCOUNTER — Ambulatory Visit: Payer: Self-pay | Admitting: Pharmacist

## 2019-08-14 NOTE — Patient Outreach (Signed)
[name]) Care Management  08/14/2019  [name] 05-03-1961 [id number]    Received all necessary documents and signatures from both patient and provider for [name] patient assistance for Jardiance.  Submitted completed application to [facility] via fax.  Will followup with BI in 5-10 business days to inquire on status of application.  Jadon Harbaugh P. Estelita Iten, [place] Management [phone]

## 2019-08-22 ENCOUNTER — Other Ambulatory Visit: Payer: Self-pay | Admitting: Pharmacy Technician

## 2019-08-22 NOTE — Patient Outreach (Signed)
[place] [facility]) Care Management  08/22/2019  [name] 13-Jun-1961 [id number]   Care coordination call placed to BI in regards to patient's application for Jardiance.  Spoke to [place] who informed patient had temporarily been approved but patient needs to apply for LIS through social security. She informed patient will received a 90 days supply of medication and it was due to arrive to patient's home on 08/24/2019.  Per Manuela Schwartz, once patient has received her LIS decisional notice, then it will need to be faxed to [facility] for further consideration. If patient receives any partial LIS then a printout of the price of the medication will also need to be submitted.  Will route note to [place] for assistance in helping patient apply for LIS.  Will followup with patient in 3-5 business days to confirm receipt of Jardiance.  Nayomi Tabron P. Hridaan Bouse, [place] Management [phone]

## 2019-08-23 ENCOUNTER — Other Ambulatory Visit: Payer: Self-pay | Admitting: Pharmacy Technician

## 2019-08-23 NOTE — Patient Outreach (Signed)
[place] [facility]) Care Management  08/23/2019  [name] [birth year] [id number]    Successful outreach call placed to patient in regards to [facility] application for [place].  Spoke to patient, HIPAA identifiers verified.  Informed patient she was temporarily approved for BI PAP but for her enrollment to be extended, a copy of her LIS letter from [place] would need to be mailed to them (patient previously applied for LIS in August  Per [place]). Also informed her that a 90 days supply of medication should be delivered to her tomorrow.  Patient informed she has not received a letter from social security indicating a decision.   Provided patient name and number so she could call me when she has received the LIS letter. Patient also verbalized understanding.  Will followup in 2-3 weeks.  Ajah Vanhoose P. Zakhari Fogel, [place] Management [phone]

## 2019-08-28 ENCOUNTER — Other Ambulatory Visit: Payer: Self-pay | Admitting: Pharmacy Technician

## 2019-08-28 ENCOUNTER — Encounter: Payer: Self-pay | Admitting: Pharmacy Technician

## 2019-08-28 NOTE — Patient Outreach (Signed)
[place]) Care Management  08/28/2019  [name] August 09, 1961 [id number]    Successful outreach call placed to patient in regards to [name] application for [name].  Spoke to patient, HIPAA identifiers verified.  Patient informed she has received the 90 days supply of medication.  Informed patient once she receives the LIS determination letter from social security to let me know. Patient inquired if I would send her a self addressed stamped envelope to mail the information back to me.  Mailed patient letter and envelope.  Will followup in 5-6 weeks if information has not been received back.  [name] P. Jashley Yellin, [place] Management [phone] s

## 2019-08-29 ENCOUNTER — Telehealth: Payer: Self-pay | Admitting: Family Medicine

## 2019-08-30 ENCOUNTER — Ambulatory Visit: Payer: PPO | Admitting: Family Medicine

## 2019-08-30 ENCOUNTER — Ambulatory Visit: Payer: Self-pay | Admitting: Pharmacist

## 2019-08-30 ENCOUNTER — Other Ambulatory Visit: Payer: Self-pay | Admitting: Family Medicine

## 2019-08-30 DIAGNOSIS — K219 Gastro-esophageal reflux disease without esophagitis: Secondary | ICD-10-CM

## 2019-08-30 DIAGNOSIS — E1142 Type 2 diabetes mellitus with diabetic polyneuropathy: Secondary | ICD-10-CM

## 2019-08-30 MED ORDER — GABAPENTIN 300 MG PO CAPS: ORAL_CAPSULE | ORAL | 0 refills | Status: DC

## 2019-08-30 NOTE — Telephone Encounter (Signed)
Appointment scheduled with Evelina Dun on 08/31/2019.

## 2019-08-30 NOTE — Addendum Note (Signed)
Addended by: Antonietta Barcelona D on: 08/30/2019 02:10 PM   Modules accepted: Orders

## 2019-08-31 ENCOUNTER — Ambulatory Visit (INDEPENDENT_AMBULATORY_CARE_PROVIDER_SITE_OTHER): Payer: PPO | Admitting: Family

## 2019-08-31 ENCOUNTER — Ambulatory Visit: Payer: PPO

## 2019-08-31 ENCOUNTER — Encounter: Payer: Self-pay | Admitting: Family

## 2019-08-31 ENCOUNTER — Other Ambulatory Visit: Payer: Self-pay

## 2019-08-31 VITALS — BP 136/83 | HR 76 | Temp 98.1°F | Ht 62.0 in | Wt 324.2 lb

## 2019-08-31 DIAGNOSIS — F17210 Nicotine dependence, cigarettes, uncomplicated: Secondary | ICD-10-CM | POA: Diagnosis not present

## 2019-08-31 DIAGNOSIS — M79672 Pain in left foot: Secondary | ICD-10-CM

## 2019-08-31 DIAGNOSIS — E1142 Type 2 diabetes mellitus with diabetic polyneuropathy: Secondary | ICD-10-CM

## 2019-08-31 DIAGNOSIS — M7732 Calcaneal spur, left foot: Secondary | ICD-10-CM | POA: Diagnosis not present

## 2019-08-31 MED ORDER — DICLOFENAC SODIUM 75 MG PO TBEC: 75.0000 mg | DELAYED_RELEASE_TABLET | Freq: Two times a day (BID) | ORAL | 0 refills | Status: DC

## 2019-08-31 NOTE — Progress Notes (Signed)
Subjective:    Patient ID: [name], female    DOB: October 11, 1961, 58 y.o.   MRN: [id number]  Chief Complaint  Patient presents with  . left heel pain    pain for three weeks    HPI PT presents to the office today for left heel pain that started three weeks ago and gradually worsening. She reports the it is a sharp pain that is worse when she walks and it is a 7 out 10. She states it feels like a "sharp nail jabbing into my heel".  She has taken tylenol with mild relief.  She reports she can only wear "bedroom slippers" because of the swelling in her foot.   She is a diabetic and her glucose has been 180-250's.    Review of Systems  Cardiovascular: Positive for leg swelling.  Musculoskeletal: Positive for arthralgias.       Left heel pain  All other systems reviewed and are negative.      Objective:   Physical Exam Vitals signs reviewed.  Constitutional:      General: She is not in acute distress.    Appearance: She is well-developed.  HENT:     Head: Normocephalic and atraumatic.  Eyes:     Pupils: Pupils are equal, round, and reactive to light.  Neck:     Musculoskeletal: Normal range of motion and neck supple.     Thyroid: No thyromegaly.  Cardiovascular:     Rate and Rhythm: Normal rate and regular rhythm.     Heart sounds: Normal heart sounds. No murmur.  Pulmonary:     Effort: Pulmonary effort is normal. No respiratory distress.     Breath sounds: Normal breath sounds. No wheezing.  Abdominal:     General: Bowel sounds are normal. There is no distension.     Palpations: Abdomen is soft.     Tenderness: There is no abdominal tenderness.  Musculoskeletal: Normal range of motion.        General: Tenderness (in bilateral heel with palpation) present.     Right lower leg: Edema (trace) present.     Left lower leg: Edema (trace) present.  Skin:    General: Skin is warm and dry.  Neurological:     Mental Status: She is alert and oriented to person, place, and  time.     Cranial Nerves: No cranial nerve deficit.     Deep Tendon Reflexes: Reflexes are normal and symmetric.  Psychiatric:        Behavior: Behavior normal.        Thought Content: Thought content normal.        Judgment: Judgment normal.       BP 136/83   Pulse 76   Temp 98.1 F (36.7 C) (Oral)   Ht 5\' 2"  (1.575 m)   Wt (!) 324 lb 3.2 oz (147.1 kg)   SpO2 98%   BMI 59.30 kg/m      Assessment & Plan:  [name] comes in today with chief complaint of left heel pain (pain for three weeks)   Diagnosis and orders addressed:  1. Type 2 diabetes mellitus with diabetic polyneuropathy, without long-term current use of insulin ([place]) - Ambulatory referral to Podiatry  2. Cigarette smoker  3. Morbid (severe) obesity due to excess calories (HCC)  4. Pain of left heel - DG Foot Complete Left; Future - diclofenac (VOLTAREN) 75 MG EC tablet; Take 1 tablet (75 mg total) by mouth 2 (two) times daily.  Dispense: 30 tablet; Refill: 0 - Ambulatory referral to Podiatry  5. Calcaneal spur of left foot - Ambulatory referral to Podiatry  Heel spur, will give diclofenac BID take with food for next 10 days. Will hold off on prednisone because of BS. Long discussion about proper shoes. Encouraged her to get diabetic shoes.  Referral pending to podiatry   Jannifer Rodney, FNP

## 2019-08-31 NOTE — Patient Instructions (Signed)
Heel Spur  A heel spur is a bony growth that forms on the bottom of the heel bone (calcaneus). Heel spurs are common. They often cause inflammation in the band of tissue that connects the toes to the heel bone (plantar fascia). This may cause pain on the bottom of the foot, near the heel. Many people with plantar fasciitis also have heel spurs. However, spurs are not the cause of plantar fasciitis pain. What are the causes? The exact cause of heel spurs is not known. They may be caused by:  Pressure on the heel bone.  Bands of tissue (tendons) pulling on the heel bone. What increases the risk? You are more likely to develop this condition if you:  Are older than 40.  Are overweight.  Have wear-and-tear arthritis (osteoarthritis).  Have plantar fascia inflammation.  Participate in sports or activities that include a lot of running or jumping.  Wear poorly fitted shoes. What are the signs or symptoms? Some people have no symptoms. If you do have symptoms, they may include:  Pain in the bottom of your heel.  Pain that is worse when you first get out of bed.  Pain that gets worse after walking or standing. How is this diagnosed? This condition may be diagnosed based on:  Your symptoms and medical history.  A physical exam.  A foot X-ray. How is this treated? Treatment for this condition depends on how much pain you have. Treatment options may include:  Doing stretching exercises.  Losing weight, if necessary.  Wearing specific shoes or inserts inside of shoes (orthotics) for comfort and support.  Wearing splints on your feet while you sleep. Splints keep your feet in a position (usually 90 degrees) that should prevent and relieve the pain you feel when you first get out of bed. They also make stretching easier in the morning.  Taking over-the-counter medicine to relieve pain, such as NSAIDs.  Using high-intensity sound waves to break up the heel spur (extracorporeal  shock wave therapy).  Getting steroid injections in your heel to reduce inflammation.  Having surgery, if your heel spur causes long-term (chronic) pain. Follow these instructions at home:  Activity  Avoid activities that cause pain until you recover, or for as long as directed by your health care provider.  Do stretching exercises as directed. Stretch before exercising or being physically active. Managing pain, stiffness, and swelling  If directed, put ice on your foot: ? Put ice in a plastic bag. ? Place a towel between your skin and the bag. ? Leave the ice on for 20 minutes, 2-3 times a day.  Move your toes often to avoid stiffness and to lessen swelling.  When possible, raise (elevate) your foot above the level of your heart while you are sitting or lying down. General instructions  Take over-the-counter and prescription medicines only as told by your health care provider.  Wear supportive shoes that fit well. Wear splints, inserts, or orthotics as told by your health care provider.  If recommended, work with your health care provider to lose weight. This can relieve pressure on your foot.  Do not use any products that contain nicotine or tobacco, such as cigarettes and e-cigarettes. These can affect bone growth and healing. If you need help quitting, ask your health care provider.  Keep all follow-up visits as told by your health care provider. This is important. Contact a health care provider if:  Your pain does not go away with treatment.  Your pain gets   worse. Summary  A heel spur is a bony growth that forms on the bottom of the heel bone (calcaneus).  Heel spurs often cause inflammation in the band of tissue that connects the toes to the heel bone (plantar fascia). This may cause pain on the bottom of the foot, near the heel.  Doing stretching exercises, losing weight, wearing specific shoes or shoe inserts, wearing splints while you sleep, and taking pain  medicine may ease the pain and stiffness.  Other treatment options may include high-intensity sound waves to break up the heel spur, steroid injections, or surgery. This information is not intended to replace advice given to you by your health care provider. Make sure you discuss any questions you have with your health care provider. Document Released: 12/01/2005 Document Revised: 10/12/2017 Document Reviewed: 10/12/2017 Elsevier Patient Education  2020 Elsevier Inc.  

## 2019-09-03 ENCOUNTER — Ambulatory Visit (INDEPENDENT_AMBULATORY_CARE_PROVIDER_SITE_OTHER): Payer: PPO | Admitting: Family Medicine

## 2019-09-03 ENCOUNTER — Other Ambulatory Visit: Payer: Self-pay

## 2019-09-03 ENCOUNTER — Telehealth: Payer: Self-pay | Admitting: Family Medicine

## 2019-09-03 ENCOUNTER — Ambulatory Visit: Payer: PPO | Admitting: Internal Medicine

## 2019-09-03 DIAGNOSIS — R609 Edema, unspecified: Secondary | ICD-10-CM

## 2019-09-03 NOTE — Progress Notes (Signed)
Subjective:    Patient ID: [name], female    DOB: [birth year], 58 y.o.   MRN: [id number]   HPI: [name] is a 58 y.o. female presenting for left leg weeping fluid. Started overnight last night. Continuing through today. Swelling is chronic in the legs. However, IT has become more severe. She feels like she pees too much and the urethral meatus starts hurting when she takes lasix so she decreased the dose to every other day.    Depression screen [facility] 2/9 08/31/2019 07/30/2019 05/18/2019 01/19/2019 01/05/2019  Decreased Interest 0 0 0 3 3  Down, Depressed, Hopeless 0 0 0 3 3  PHQ - 2 Score 0 0 0 6 6  Altered sleeping - 0 0 3 0  Tired, decreased energy - 0 0 3 3  Change in appetite - 0 0 3 3  Feeling bad or failure about yourself  - 0 0 2 2  Trouble concentrating - 0 0 3 2  Moving slowly or fidgety/restless - 0 0 3 2  Suicidal thoughts - 0 0 0 0  PHQ-9 Score - 0 0 23 18  Difficult doing work/chores - - - - Somewhat difficult  Some recent data might be hidden     Relevant past medical, surgical, family and social history reviewed and updated as indicated.  Interim medical history since our last visit reviewed. Allergies and medications reviewed and updated.  ROS:  Review of Systems  Constitutional: Negative.   HENT: Negative for congestion.   Eyes: Negative for visual disturbance.  Respiratory: Negative for shortness of breath.   Cardiovascular: Negative for chest pain.  Gastrointestinal: Negative for abdominal pain, constipation, diarrhea, nausea and vomiting.  Genitourinary: Positive for frequency. Negative for difficulty urinating.  Musculoskeletal: Negative for arthralgias and myalgias.  Neurological: Negative for headaches.  Psychiatric/Behavioral: Negative for sleep disturbance.     Social History   Tobacco Use  Smoking Status Current Every Day Smoker  . Packs/day: 1.00  . Years: 47.00  . Pack years: 47.00  . Types: Cigarettes  . Start date: 05/14/1972   Smokeless Tobacco Never Used       Objective:     Wt Readings from Last 3 Encounters:  08/31/19 (!) 324 lb 3.2 oz (147.1 kg)  07/30/19 (!) 313 lb (142 kg)  06/05/19 (!) 311 lb (141.1 kg)     Exam deferred. Pt. Harboring due to COVID 19. Phone visit performed.   Assessment & Plan:   1. Dependent edema      Diagnoses and all orders for this visit:  Dependent edema    Virtual Visit via telephone Note  I discussed the limitations, risks, security and privacy concerns of performing an evaluation and management service by telephone and the availability of in person appointments. The patient was identified with two identifiers. Pt.expressed understanding and agreed to proceed. Pt. Is at home. Dr. Darlyn Read is in his office.  Follow Up Instructions:   I discussed the assessment and treatment plan with the patient. The patient was provided an opportunity to ask questions and all were answered. The patient agreed with the plan and demonstrated an understanding of the instructions.   The patient was advised to call back or seek an in-person evaluation if the symptoms worsen or if the condition fails to improve as anticipated.  Pt. Was advised to keep her legs elevated and to take her lasix daily. She will need to wear compression hose as well.  Total minutes including chart  review and phone contact time: 23   Follow up plan: Return in about 2 weeks (around 09/17/2019).  Stacey Fraise, MD [place]

## 2019-09-03 NOTE — Telephone Encounter (Signed)
appt made with on call provider for televisit

## 2019-09-08 ENCOUNTER — Encounter: Payer: Self-pay | Admitting: Family Medicine

## 2019-09-13 ENCOUNTER — Other Ambulatory Visit: Payer: Self-pay | Admitting: Pharmacy Technician

## 2019-09-13 NOTE — Patient Outreach (Signed)
[facility]) Care Management  09/13/2019  [name] 01-31-1961 [id number]   Care coordination fax sent to [facility] in regards to patient's application for Jardiance.  BI was requesting that patient apply for LIS and to send in the determination letter to them to determine if her eligibility can continue.   Received notification in the mail and faxed to [facility] today.  Will followup with BI in 3-5 business days to inquire about eligibility extension.  [name] P. Dalyn Kjos, [place] Management [phone]

## 2019-09-17 ENCOUNTER — Other Ambulatory Visit: Payer: Self-pay | Admitting: Pharmacy Technician

## 2019-09-17 NOTE — Patient Outreach (Signed)
[facility]) Care Management  09/17/2019  [name] 11/18/1960 [id number]    Care coordination call placed to BI in regards to patient's application for Jardiance.  Spoke to April who informed that since patient has partial LIS then her OOP yearly expenditure for the Jardiance would have to be 3% of patient's income per year which in this case would be 749.52 based off the income submtitted.  The OOP amount that the patient's insurance reported would only be $540 if based off a 30 days supply ($45*30) or $360 based off a 90 days supply ($90*4).  Therefore patient does not qualify for the program as she does not meet the OOP expenditure requirement since she has LIS 4.  Will route note to [place] that patient has been denied and will remove myself from the care team.  Luiz Ochoa. Peter Daquila, [place] Management [phone]

## 2019-09-18 ENCOUNTER — Ambulatory Visit: Payer: PPO | Admitting: Internal Medicine

## 2019-09-24 ENCOUNTER — Ambulatory Visit: Payer: Self-pay | Admitting: Pharmacist

## 2019-09-25 ENCOUNTER — Telehealth: Payer: Self-pay | Admitting: Pharmacist

## 2019-09-25 NOTE — Telephone Encounter (Signed)
-----   Message from Jason Fila, CPhT sent at 09/17/2019  1:36 PM EST ----- FYI Pt denied. Sharee Pimple

## 2019-09-25 NOTE — Patient Outreach (Addendum)
[facility]) Care Management  09/25/2019  [name] February 23, 1961 [id number]  Patient was called to follow up on medication assistance. HIPAA identifiers were obtained.  We have been in the process of trying to get Jardiance from [place]. Patient was originally denied and an override was submitted.  Message was received from [employer] stating the following about the patient's patient assistance:  Spoke to April who informed that since patient has partial LIS then her OOP yearly expenditure for the Jardiance would have to be 3% of patient's income per year which in this case would be 749.52 based off the income submtitted.  The OOP amount that the patient's insurance reported would only be $540 if based off a 30 days supply ($45*30) or $360 based off a 90 days supply ($90*4).  Therefore patient does not qualify for the program as she does not meet the OOP expenditure requirement since she has LIS 4.  As such, the patient has not met the requirements and will not be able to access Jardiance through [employer]'s patient assistance.  This was explained to the patient.  Plan: Route note to her provider. Close patient's case as [facility] is no longer providing case management services to traditional HTA patients.  Elayne Guerin, PharmD, [place] Clinical Pharmacist [phone]

## 2019-10-01 ENCOUNTER — Ambulatory Visit: Payer: Self-pay | Admitting: Pharmacist

## 2019-11-19 ENCOUNTER — Other Ambulatory Visit: Payer: Self-pay | Admitting: Family

## 2019-11-19 ENCOUNTER — Other Ambulatory Visit: Payer: Self-pay | Admitting: Family Medicine

## 2019-11-19 DIAGNOSIS — E1142 Type 2 diabetes mellitus with diabetic polyneuropathy: Secondary | ICD-10-CM

## 2019-11-19 DIAGNOSIS — K219 Gastro-esophageal reflux disease without esophagitis: Secondary | ICD-10-CM

## 2019-11-26 ENCOUNTER — Ambulatory Visit (INDEPENDENT_AMBULATORY_CARE_PROVIDER_SITE_OTHER): Payer: Medicare Other | Admitting: Family Medicine

## 2019-11-26 DIAGNOSIS — E1142 Type 2 diabetes mellitus with diabetic polyneuropathy: Secondary | ICD-10-CM | POA: Diagnosis not present

## 2019-11-26 MED ORDER — SITAGLIPTIN PHOSPHATE 100 MG PO TABS: 100.0000 mg | ORAL_TABLET | Freq: Every day | ORAL | 5 refills | Status: DC

## 2019-11-26 NOTE — Progress Notes (Signed)
Telephone visit  Subjective: CC: DM2 PCP: [name], DO KKX:FGHWE [name] is a 59 y.o. female calls for telephone consult today. Patient provides verbal consent for consult held via phone.  Due to COVID-19 pandemic this visit was conducted virtually. This visit type was conducted due to national recommendations for restrictions regarding the COVID-19 Pandemic (e.g. social distancing, sheltering in place) in an effort to limit this patient's exposure and mitigate transmission in our community. All issues noted in this document were discussed and addressed.  A physical exam was not performed with this format.   Location of patient: Home Location of provider: Working remotely from home Others present for call: None  1.  Uncontrolled diabetes Patient reports that her blood sugars have been running 230-240s.  She is compliant with Metformin 1000 mg twice daily, Amaryl 2 mg daily and had been compliant with Jardiance up until the last few days secondary to unaffordability.  She notes when she went to the pharmacy it was well over $200.  She is asking for an alternative.  Unsure as what her insurance is covering.  She was previously on Januvia and she tolerated this medicine without difficulty but it also became something that was not affordable.  She tried applying for financial assistance with the medicine but she notes she did not qualify.  ROS: Per HPI  Allergies  Allergen Reactions  . Morphine And Related Nausea And Vomiting   Past Medical History:  Diagnosis Date  . Arthritis   . CAD (coronary artery disease)    BMS to circumflex 2007 - Dr. Juanda Chance  . CKD (chronic kidney disease) stage 3, GFR 30-59 ml/min   . Essential hypertension   . Falls frequently   . GERD (gastroesophageal reflux disease)   . HA (headache)   . Hyperlipidemia   . Left-sided face pain   . Morbid obesity (HCC)   . Noncompliance   . OSA (obstructive sleep apnea)    Uses CPAP  . Type 2 diabetes mellitus  (HCC)   . Urinary incontinence     Current Outpatient Medications:  .  acetaminophen (TYLENOL) 650 MG CR tablet, Take 650 mg by mouth every 8 (eight) hours as needed for pain., Disp: , Rfl:  .  albuterol (VENTOLIN HFA) 108 (90 Base) MCG/ACT inhaler, Inhale 2 puffs into the lungs every 6 (six) hours as needed for wheezing or shortness of breath (for rescue)., Disp: 1 Inhaler, Rfl: 0 .  aspirin EC 325 MG tablet, Take 325 mg by mouth every morning. , Disp: , Rfl:  .  colestipol (COLESTID) 1 g tablet, Take 2 tablets (2 g total) by mouth 2 (two) times daily., Disp: 120 tablet, Rfl: 4 .  diclofenac (VOLTAREN) 75 MG EC tablet, Take 1 tablet (75 mg total) by mouth 2 (two) times daily., Disp: 30 tablet, Rfl: 0 .  diclofenac sodium (VOLTAREN) 1 % GEL, Apply 4 g topically 4 (four) times daily., Disp: 400 g, Rfl: 2 .  empagliflozin (JARDIANCE) 25 MG TABS tablet, Take 25 mg by mouth daily., Disp: 28 tablet, Rfl: 1 .  furosemide (LASIX) 40 MG tablet, Take 1 tablet (40 mg total) by mouth daily as needed for fluid or edema., Disp: 90 tablet, Rfl: 1 .  gabapentin (NEURONTIN) 300 MG capsule, TAKE 2 CAPSULES BY MOUTH IN THE MORNING AND 2 AT NOON AND 2 IN THE EVENING (Needs to be seen before next refill), Disp: 180 capsule, Rfl: 0 .  glimepiride (AMARYL) 2 MG tablet, Take 1 tablet (  2 mg total) by mouth daily before breakfast., Disp: 90 tablet, Rfl: 3 .  metFORMIN (GLUCOPHAGE) 500 MG tablet, Take 2 tablets (1,000 mg total) by mouth 2 (two) times daily with a meal., Disp: 360 tablet, Rfl: 1 .  metoprolol tartrate (LOPRESSOR) 25 MG tablet, Take 1 tablet (25 mg total) by mouth 2 (two) times daily., Disp: 180 tablet, Rfl: 3 .  nitroGLYCERIN (NITROSTAT) 0.4 MG SL tablet, See AUX Label, Disp: 12 tablet, Rfl: 0 .  omeprazole (PRILOSEC) 40 MG capsule, TAKE 1 CAPSULE BY MOUTH ONCE DAILY 30  MINUTES  BEFORE  A  MEAL, Disp: 90 capsule, Rfl: 1 .  ONETOUCH DELICA LANCETS 29H MISC, Check blood sugar BID and prn  E11.9, Disp: 100  each, Rfl: 11 .  ONETOUCH VERIO test strip, USE 1 STRIP TO CHECK GLUCOSE TWICE DAILY AND AS NEEDED, Disp: 100 each, Rfl: 5 .  rosuvastatin (CRESTOR) 20 MG tablet, Take 1 tablet (20 mg total) by mouth at bedtime., Disp: 90 tablet, Rfl: 3 .  telmisartan (MICARDIS) 40 MG tablet, Take 1 tablet (40 mg total) by mouth daily., Disp: 30 tablet, Rfl: 11  Assessment/ Plan: 58 y.o. female   1. Type 2 diabetes mellitus with diabetic polyneuropathy, without long-term current use of insulin (HCC) Jardiance no longer affordable it was over $200.  Will replace with Januvia.  Unsure of this cost for Jardiance was reflective of her deductible versus if this is the cost expected of this medication going forward.  She is on max dose of Metformin and is also on a sulfonylurea.  I discussed with her also checking her insurance for Ozempic and Trulicity coverage.  We will try her best to prescribe her something that will work well to reduce her blood sugar and cardiovascular risk while not causing excessive financial burden.  She will contact me tomorrow with this information. - sitaGLIPtin (JANUVIA) 100 MG tablet; Take 1 tablet (100 mg total) by mouth daily.  Dispense: 30 tablet; Refill: 5   Start time: 3:04pm End time: 3:11pm  Total time spent on patient care (including telephone call/ virtual visit): 10 minutes  [place], [place] [phone]

## 2019-11-27 ENCOUNTER — Telehealth: Payer: Self-pay | Admitting: Family Medicine

## 2019-11-27 NOTE — Telephone Encounter (Signed)
Might understand that her insurance company would not give her what is on her formulary? Unfortunately, I have no clue as to what is covered under her insurance plan.  There are no other generic options. Please have her try and recontact her insurance company and inquire on the medications that we discussed.

## 2019-11-27 NOTE — Telephone Encounter (Signed)
Insurance is not wanting to pay for Januvia? It was going to cost her $236.00 and she can not afford.

## 2019-11-27 NOTE — Telephone Encounter (Signed)
I tried calling the number the pt gave but it was for PA and we aren't doing a PA so I called the pt and she said she wasn't able to get any information regarding other medications.

## 2019-11-27 NOTE — Telephone Encounter (Signed)
I addressed this earlier in a message.  She was supposed to call her insurance company to see if there were any alternatives to the medication.  She is already on max dose of Metformin and also on a sulfonylurea.  I am not sure what other alternatives there are out there without her inquiring her insurance as to what is covered on her formulary.

## 2019-11-28 NOTE — Telephone Encounter (Signed)
Pt is aware to call her insurance and she will call today.

## 2019-11-30 NOTE — Telephone Encounter (Signed)
Phone call taken care of in different encounter.  This encounter will now be closed  

## 2019-12-06 ENCOUNTER — Ambulatory Visit: Payer: PPO | Admitting: Internal Medicine

## 2019-12-21 ENCOUNTER — Ambulatory Visit (INDEPENDENT_AMBULATORY_CARE_PROVIDER_SITE_OTHER): Payer: Medicare Other | Admitting: *Deleted

## 2019-12-21 DIAGNOSIS — Z0000 Encounter for general adult medical examination without abnormal findings: Secondary | ICD-10-CM

## 2019-12-21 NOTE — Patient Instructions (Signed)

## 2019-12-21 NOTE — Progress Notes (Signed)
MEDICARE ANNUAL WELLNESS VISIT  12/21/2019  Telephone Visit Disclaimer This Medicare AWV was conducted by telephone due to national recommendations for restrictions regarding the COVID-19 Pandemic (e.g. social distancing).  I verified, using two identifiers, that I am speaking with [name] or their authorized healthcare agent. I discussed the limitations, risks, security, and privacy concerns of performing an evaluation and management service by telephone and the potential availability of an in-person appointment in the future. The patient expressed understanding and agreed to proceed.   Subjective:  [name] is a 59 y.o. female patient of Stacey Ip, DO who had a Medicare Annual Wellness Visit today via telephone. [name] is Disabled and lives with their spouse. she has 5 children. she reports that she is socially active and does interact with friends/family regularly. she is not physically active and enjoys reading Bible verses, watching TV and playing games on her tablet.  Patient Care Team: Stacey Ip, DO as PCP - General (Family Medicine) Levert Feinstein, MD as Consulting Physician (Neurology) Ranee Gosselin, MD as Consulting Physician (Orthopedic Surgery) Jonelle Sidle, MD as Consulting Physician (Cardiology)  Advanced Directives 12/21/2019 11/27/2018 12/08/2017 11/29/2017 08/22/2015 04/25/2015 04/22/2015  Does Patient Have a Medical Advance Directive? No No No No No No No  Would patient like information on creating a medical advance directive? No - Patient declined Yes (MAU/Ambulatory/Procedural Areas - Information given) No - Patient declined No - Patient declined No - patient declined information No - patient declined information Yes - Educational materials given  Pre-existing out of facility DNR order (yellow form or pink MOST form) - - - - - - -    Hospital Utilization Over the Past 12 Months: # of hospitalizations or ER visits: 0 # of surgeries: 0   Review of Systems    Patient reports that her overall health is worse compared to last year.  History obtained from chart review  Patient Reported Readings (BP, Pulse, CBG, Weight, etc) none  Pain Assessment Pain : No/denies pain     Current Medications & Allergies (verified) Allergies as of 12/21/2019      Reactions   Morphine And Related Nausea And Vomiting      Medication List       Accurate as of December 21, 2019  9:05 AM. If you have any questions, ask your nurse or doctor.        STOP taking these medications   sitaGLIPtin 100 MG tablet Commonly known as: Januvia     TAKE these medications   acetaminophen 650 MG CR tablet Commonly known as: TYLENOL Take 650 mg by mouth every 8 (eight) hours as needed for pain.   albuterol 108 (90 Base) MCG/ACT inhaler Commonly known as: VENTOLIN HFA Inhale 2 puffs into the lungs every 6 (six) hours as needed for wheezing or shortness of breath (for rescue).   aspirin EC 325 MG tablet Take 325 mg by mouth every morning.   colestipol 1 g tablet Commonly known as: COLESTID Take 2 tablets (2 g total) by mouth 2 (two) times daily.   diclofenac 75 MG EC tablet Commonly known as: VOLTAREN Take 1 tablet (75 mg total) by mouth 2 (two) times daily.   diclofenac sodium 1 % Gel Commonly known as: VOLTAREN Apply 4 g topically 4 (four) times daily.   furosemide 40 MG tablet Commonly known as: LASIX Take 1 tablet (40 mg total) by mouth daily as needed for fluid or edema.   gabapentin  300 MG capsule Commonly known as: NEURONTIN TAKE 2 CAPSULES BY MOUTH IN THE MORNING AND 2 AT NOON AND 2 IN THE EVENING (Needs to be seen before next refill)   glimepiride 2 MG tablet Commonly known as: AMARYL Take 1 tablet (2 mg total) by mouth daily before breakfast.   metFORMIN 500 MG tablet Commonly known as: GLUCOPHAGE Take 2 tablets (1,000 mg total) by mouth 2 (two) times daily with a meal.   metoprolol tartrate 25 MG tablet Commonly  known as: LOPRESSOR Take 1 tablet (25 mg total) by mouth 2 (two) times daily.   nitroGLYCERIN 0.4 MG SL tablet Commonly known as: NITROSTAT See AUX Label   omeprazole 40 MG capsule Commonly known as: PRILOSEC TAKE 1 CAPSULE BY MOUTH ONCE DAILY 30  MINUTES  BEFORE  A  MEAL   OneTouch Delica Lancets 33G Misc Check blood sugar BID and prn  E11.9   OneTouch Verio test strip Generic drug: glucose blood USE 1 STRIP TO CHECK GLUCOSE TWICE DAILY AND AS NEEDED   rosuvastatin 20 MG tablet Commonly known as: CRESTOR Take 1 tablet (20 mg total) by mouth at bedtime.   telmisartan 40 MG tablet Commonly known as: Micardis Take 1 tablet (40 mg total) by mouth daily.       History (reviewed): Past Medical History:  Diagnosis Date  . Arthritis   . CAD (coronary artery disease)    BMS to circumflex 2007 - Dr. Juanda Chance  . CKD (chronic kidney disease) stage 3, GFR 30-59 ml/min   . Essential hypertension   . Falls frequently   . GERD (gastroesophageal reflux disease)   . HA (headache)   . Hyperlipidemia   . Left-sided face pain   . Morbid obesity (HCC)   . Noncompliance   . OSA (obstructive sleep apnea)    Uses CPAP  . Type 2 diabetes mellitus (HCC)   . Urinary incontinence    Past Surgical History:  Procedure Laterality Date  . ABDOMINAL HERNIA REPAIR    . ABDOMINAL HYSTERECTOMY    . BACK SURGERY    . BREAST SURGERY     biopsy per right breast; pt states has silver piece in for marking   . Cataract surgery Bilateral   . CHOLECYSTECTOMY    . COLONOSCOPY N/A 04/10/2013   Procedure: COLONOSCOPY;  Surgeon: Malissa Hippo, MD;  Location: [facility];  Service: Endoscopy;  Laterality: N/A;  . CORONARY ANGIOPLASTY WITH STENT PLACEMENT  2012  . LUMBAR LAMINECTOMY/DECOMPRESSION MICRODISCECTOMY N/A 04/25/2015   Procedure: CENTRAL LUMBAR /DECOMPRESSION L4-L5;  Surgeon: Ranee Gosselin, MD;  Location: [facility];  Service: Orthopedics;  Laterality: N/A;  . TUBAL LIGATION     Family History   Problem Relation Age of Onset  . Coronary artery disease Father   . Cancer - Colon Father   . Diabetes Father   . High Cholesterol Father   . Breast cancer Mother   . Arthritis Sister   . Asthma Sister   . High Cholesterol Daughter   . Thyroid disease Daughter   . Hiatal hernia Son   . Congenital heart disease Son    Social History   Socioeconomic History  . Marital status: Married    Spouse name: [name]  . Number of children: 5  . Years of education: 10 th  . Highest education level: 10th grade  Occupational History  . Occupation: disabled     Comment: disabled  Tobacco Use  . Smoking status: Current Every Day Smoker  Packs/day: 1.00    Years: 47.00    Pack years: 47.00    Types: Cigarettes    Start date: 05/14/1972  . Smokeless tobacco: Never Used  Substance and Sexual Activity  . Alcohol use: No    Alcohol/week: 0.0 standard drinks  . Drug use: No  . Sexual activity: Not Currently    Birth control/protection: Surgical  Other Topics Concern  . Not on file  Social History Narrative   Patient lives at home with her husband and grandchild Gwyndolyn Saxon). Patient is disabled.   Patient has 10 th grade education.   Right handed.   Caffeine- one  cup of coffee and  One soda Dr.Pepper/ tea. daily   Social Determinants of Health   Financial Resource Strain: Medium Risk  . Difficulty of Paying Living Expenses: Somewhat hard  Food Insecurity: No Food Insecurity  . Worried About Charity fundraiser in the Last Year: Never true  . Ran Out of Food in the Last Year: Never true  Transportation Needs: No Transportation Needs  . Lack of Transportation (Medical): No  . Lack of Transportation (Non-Medical): No  Physical Activity: Inactive  . Days of Exercise per Week: 0 days  . Minutes of Exercise per Session: 0 min  Stress: Stress Concern Present  . Feeling of Stress : To some extent  Social Connections: Somewhat Isolated  . Frequency of Communication with Friends and  Family: More than three times a week  . Frequency of Social Gatherings with Friends and Family: More than three times a week  . Attends Religious Services: Never  . Active Member of Clubs or Organizations: No  . Attends Archivist Meetings: Never  . Marital Status: Married    Activities of Daily Living In your present state of health, do you have any difficulty performing the following activities: 12/21/2019  Hearing? N  Vision? N  Comment wears OTC readers-gets yearly eye exam  Difficulty concentrating or making decisions? N  Walking or climbing stairs? Y  Comment due to knee/back pain-and has to use a cane or walker at all times  Dressing or bathing? N  Doing errands, shopping? Y  Comment she doesn't like to go places on her own anymore-her husband or her grandson usually always go with her  Conservation officer, nature and eating ? Y  Comment her husband does all the cooking  Using the Toilet? N  In the past six months, have you accidently leaked urine? Y  Comment wears pads/depends at all times  Do you have problems with loss of bowel control? Y  Comment at times-she has loose stools frequently and sometimes don't get to the bathroom on time  Managing your Medications? N  Comment uses a pill box  Managing your Finances? N  Housekeeping or managing your Housekeeping? Y  Comment her husband does most of the housekeeping-her knees and back hurt too bad  Some recent data might be hidden    Patient Education/ Literacy How often do you need to have someone help you when you read instructions, pamphlets, or other written materials from your doctor or pharmacy?: 2 - Rarely What is the last grade level you completed in school?: 10th grade  Exercise Current Exercise Habits: The patient does not participate in regular exercise at present, Exercise limited by: orthopedic condition(s);respiratory conditions(s)  Diet Patient reports consuming 1 meals a day and 2 snack(s) a day Patient  reports that her primary diet is: Regular Patient reports that she does have regular access  to food.   Depression Screen PHQ 2/9 Scores 12/21/2019 08/31/2019 07/30/2019 05/18/2019 01/19/2019 01/05/2019 11/27/2018  PHQ - 2 Score 0 0 0 0 6 6 5   PHQ- 9 Score - - 0 0 23 18 20   Exception Documentation - - - - - - -  Not completed - - - - - - -     Fall Risk Fall Risk  12/21/2019 08/31/2019 08/31/2019 02/07/2019 01/19/2019  Falls in the past year? 0 - 0 0 0  Comment - - - - -  Number falls in past yr: - - - - -  Injury with Fall? - - - - -  Comment - - - - -  Risk Factor Category  - - - - -  Risk for fall due to : Impaired mobility;History of fall(s) Other (Comment) Other (Comment) - -  Risk for fall due to: Comment she uses her cane in the house but when she goes out she uses the walker Obesity and stent . - - -  Follow up - - - - -     Objective:  [name] seemed alert and oriented and she participated appropriately during our telephone visit.  Blood Pressure Weight BMI  BP Readings from Last 3 Encounters:  08/31/19 136/83  07/30/19 126/70  06/05/19 116/60   Wt Readings from Last 3 Encounters:  08/31/19 (!) 324 lb 3.2 oz (147.1 kg)  07/30/19 (!) 313 lb (142 kg)  06/05/19 (!) 311 lb (141.1 kg)   BMI Readings from Last 1 Encounters:  08/31/19 59.30 kg/m    *Unable to obtain current vital signs, weight, and BMI due to telephone visit type  Hearing/Vision  . Addi did not seem to have difficulty with hearing/understanding during the telephone conversation . Reports that she has had a formal eye exam by an eye care professional within the past year . Reports that she has not had a formal hearing evaluation within the past year *Unable to fully assess hearing and vision during telephone visit type  Cognitive Function: 6CIT Screen 12/21/2019  What Year? 0 points  What month? 0 points  What time? 0 points  Count back from 20 0 points  Months in reverse 0 points  Repeat phrase 0  points  Total Score 0   (Normal:0-7, Significant for Dysfunction: >8)  Normal Cognitive Function Screening: Yes   Immunization & Health Maintenance Record Immunization History  Administered Date(s) Administered  . Influenza,inj,Quad PF,6+ Mos 08/10/2013, 08/28/2014, 09/25/2015, 11/23/2017, 11/27/2018  . Pneumococcal Conjugate-13 11/27/2018    Health Maintenance  Topic Date Due  . HIV Screening  05/10/1974  . TETANUS/TDAP  05/10/1978  . COLONOSCOPY  04/10/2018  . INFLUENZA VACCINE  03/28/2020 (Originally 06/09/2019)  . OPHTHALMOLOGY EXAM  12/26/2019  . HEMOGLOBIN A1C  01/27/2020  . FOOT EXAM  07/29/2020  . MAMMOGRAM  10/17/2020  . PNEUMOCOCCAL POLYSACCHARIDE VACCINE AGE 71-64 HIGH RISK  Completed  . Hepatitis C Screening  Completed  . PAP SMEAR-Modifier  Discontinued       Assessment  This is a routine wellness examination for RHYEN MCMICKENS.  Health Maintenance: Due or Overdue Health Maintenance Due  Topic Date Due  . HIV Screening  05/10/1974  . TETANUS/TDAP  05/10/1978  . COLONOSCOPY  04/10/2018    [name] does not need a referral for Community Assistance: Care Management:   no Social Work:    no Prescription Assistance:  no Nutrition/Diabetes Education:  no   Plan:  Personalized Goals Goals Addressed  This Visit's Progress   . DIET - INCREASE WATER INTAKE       Try to drink 6-8 glasses of water daily      Personalized Health Maintenance & Screening Recommendations  Td vaccine Shingles vaccine  Lung Cancer Screening Recommended: yes-declines at this time (Low Dose CT Chest recommended if Age 80-80 years, 30 pack-year currently smoking OR have quit w/in past 15 years) Hepatitis C Screening recommended: no HIV Screening recommended: yes-offer at next visit with PCP  Advanced Directives: Written information was not prepared per patient's request.  Referrals & Orders No orders of the defined types were placed in this encounter.    Follow-up Plan . Follow-up with Stacey Ip, DO as planned . Consider TDAP and Shingles vaccines at your next visit with your PCP   I have personally reviewed and noted the following in the patient's chart:   . Medical and social history . Use of alcohol, tobacco or illicit drugs  . Current medications and supplements . Functional ability and status . Nutritional status . Physical activity . Advanced directives . List of other physicians . Hospitalizations, surgeries, and ER visits in previous 12 months . Vitals . Screenings to include cognitive, depression, and falls . Referrals and appointments  In addition, I have reviewed and discussed with [name] certain preventive protocols, quality metrics, and best practice recommendations. A written personalized care plan for preventive services as well as general preventive health recommendations is available and can be mailed to the patient at her request.      Hessie Diener, LPN  [id number]

## 2019-12-24 ENCOUNTER — Ambulatory Visit: Payer: PPO | Admitting: Family Medicine

## 2019-12-26 ENCOUNTER — Other Ambulatory Visit: Payer: Self-pay | Admitting: Family Medicine

## 2019-12-26 DIAGNOSIS — E1142 Type 2 diabetes mellitus with diabetic polyneuropathy: Secondary | ICD-10-CM

## 2019-12-26 DIAGNOSIS — E1159 Type 2 diabetes mellitus with other circulatory complications: Secondary | ICD-10-CM

## 2020-01-09 ENCOUNTER — Telehealth: Payer: Self-pay | Admitting: Family Medicine

## 2020-01-09 NOTE — Chronic Care Management (AMB) (Signed)
  Chronic Care Management   Outreach Note  01/09/2020 Name: GWYNDOLYN GUILFORD MRN: [id number] DOB: 07-01-1961  ROSELENE GRAY is a 59 y.o. year old female who is a primary care patient of [name], DO. I reached out to [name] by phone today in response to a referral sent by Ms. Ronnette Hila Udovich's health plan.     An unsuccessful telephone outreach was attempted today. The patient was referred to the case management team for assistance with care management and care coordination.   Follow Up Plan: The care management team will reach out to the patient again over the next 7 days.  If patient returns call to provider office, please advise to call Embedded Care Management Care Guide Penne Lash at [phone]  Penne Lash, RMA Care Guide, Embedded Care Coordination [facility]  [place], [place] [address] Direct Dial: [phone] [email or web address] Website: [email or web address]

## 2020-01-09 NOTE — Chronic Care Management (AMB) (Signed)
  Chronic Care Management   Note  01/09/2020 Name: [name] MRN: [id number] DOB: 1961/07/18  [name] is a 59 y.o. year old female who is a primary care patient of Janora Norlander, DO. I reached out to Rosana Berger by phone today in response to a referral sent by Ms. Wandra Arthurs Usman's health plan.     Ms. Guilbault was given information about Chronic Care Management services today including:  1. CCM service includes personalized support from designated clinical staff supervised by her physician, including individualized plan of care and coordination with other care providers 2. 24/7 contact phone numbers for assistance for urgent and routine care needs. 3. Service will only be billed when office clinical staff spend 20 minutes or more in a month to coordinate care. 4. Only one practitioner may furnish and bill the service in a calendar month. 5. The patient may stop CCM services at any time (effective at the end of the month) by phone call to the office staff. 6. The patient will be responsible for cost sharing (co-pay) of up to 20% of the service fee (after annual deductible is met).  Patient agreed to services and verbal consent obtained.   Follow up plan: Telephone appointment with care management team member scheduled for: 02/29/20  Noreene Larsson, [place], [place], [place] [address] Direct Dial: [phone] [email or web address] Website: [email or web address]

## 2020-01-27 ENCOUNTER — Other Ambulatory Visit: Payer: Self-pay | Admitting: Family Medicine

## 2020-01-27 DIAGNOSIS — E1159 Type 2 diabetes mellitus with other circulatory complications: Secondary | ICD-10-CM

## 2020-01-27 DIAGNOSIS — E1142 Type 2 diabetes mellitus with diabetic polyneuropathy: Secondary | ICD-10-CM

## 2020-01-28 ENCOUNTER — Other Ambulatory Visit: Payer: Self-pay | Admitting: Family Medicine

## 2020-01-28 NOTE — Telephone Encounter (Signed)
  Medication Request  01/28/2020  What is the name of the medication? Gabapentin 300 mg - Patient is out  Have you contacted your pharmacy to request a refill? YES  Which pharmacy would you like this sent to? [facility]   Patient notified that their request is being sent to the clinical staff for review and that they should receive a call once it is complete. If they do not receive a call within 24 hours they can check with their pharmacy or our office.

## 2020-01-28 NOTE — Telephone Encounter (Signed)
Pt aware refill sent to pharmacy 

## 2020-02-18 ENCOUNTER — Other Ambulatory Visit: Payer: Self-pay | Admitting: Family Medicine

## 2020-02-18 DIAGNOSIS — E1169 Type 2 diabetes mellitus with other specified complication: Secondary | ICD-10-CM

## 2020-02-29 ENCOUNTER — Other Ambulatory Visit: Payer: Self-pay

## 2020-02-29 ENCOUNTER — Ambulatory Visit (INDEPENDENT_AMBULATORY_CARE_PROVIDER_SITE_OTHER): Payer: Medicare Other | Admitting: *Deleted

## 2020-02-29 DIAGNOSIS — E1159 Type 2 diabetes mellitus with other circulatory complications: Secondary | ICD-10-CM | POA: Diagnosis not present

## 2020-02-29 DIAGNOSIS — E1142 Type 2 diabetes mellitus with diabetic polyneuropathy: Secondary | ICD-10-CM

## 2020-02-29 DIAGNOSIS — I10 Essential (primary) hypertension: Secondary | ICD-10-CM

## 2020-02-29 NOTE — Patient Instructions (Signed)
Visit Information  Goals Addressed            This Visit's Progress   . Chronic Disease Management Needs       CARE PLAN ENTRY (see longtitudinal plan of care for additional care plan information)  Current Barriers:  . Chronic Disease Management support, education, and care coordination needs related to CAD, HTN, OSA, DM, arthritis, CKD, HLD  Clinical Goal(s) related to CAD, HTN, OSA, DM, arthritis, CKD, HLD:  Over the next 45 days, patient will:  . Work with the care management team to address educational, disease management, and care coordination needs  . Begin or continue self health monitoring activities as directed today Measure and record cbg (blood glucose) 1 times daily and Measure and record blood pressure 3 times per week . Call provider office for new or worsened signs and symptoms Blood glucose findings outside established parameters and Blood pressure findings outside established parameters . Call care management team with questions or concerns . Verbalize basic understanding of patient centered plan of care established today  Interventions related to CAD, HTN, OSA, DM, arthritis, CKD, HLD:  . Evaluation of current treatment plans and patient's adherence to plan as established by provider . Assessed patient understanding of disease states . Assessed patient's education and care coordination needs . Provided disease specific education to patient  . Collaborated with appropriate clinical care team members regarding patient needs . Provided with CCM contact information and encouraged to reach out as needed . Scheduled appointment with PCP for a follow-up on her chronic disease management  Patient Self Care Activities related to CAD, HTN, OSA, DM, arthritis, CKD, HLD:  . Patient is unable to independently self-manage chronic health conditions  Initial goal documentation        [name] was given information about Chronic Care Management services today including:   1. CCM service includes personalized support from designated clinical staff supervised by her physician, including individualized plan of care and coordination with other care providers 2. 24/7 contact phone numbers for assistance for urgent and routine care needs. 3. Service will only be billed when office clinical staff spend 20 minutes or more in a month to coordinate care. 4. Only one practitioner may furnish and bill the service in a calendar month. 5. The patient may stop CCM services at any time (effective at the end of the month) by phone call to the office staff. 6. The patient will be responsible for cost sharing (co-pay) of up to 20% of the service fee (after annual deductible is met).  Patient agreed to services and verbal consent obtained.   The patient verbalized understanding of instructions provided today and declined a print copy of patient instruction materials.   The care management team will reach out to the patient again over the next 45 days.   Chong Sicilian, BSN, RN-BC Embedded Chronic Care Manager [facility] Direct Dial: [phone]

## 2020-02-29 NOTE — Chronic Care Management (AMB) (Signed)
Chronic Care Management   Initial Visit Note  02/29/2020 Name: [name] MRN: [id number] DOB: 19-Oct-1961  Referred by: [name], DO Reason for referral : Chronic Care Management   [name] is a 59 y.o. year old female who is a primary care patient of [name], DO. The CCM team was consulted for assistance with chronic disease management and care coordination needs related to CAD, HTN, OSA, DM, arthritis, CKD, HLD  Review of patient status, including review of consultants reports, relevant laboratory and other test results, and collaboration with appropriate care team members and the patient's provider was performed as part of comprehensive patient evaluation and provision of chronic care management services.    Subjective: I spoke with [name] by telephone today regarding management of her chronic medical conditions.   SDOH (Social Determinants of Health) assessments performed: Yes See Care Plan activities for detailed interventions related to SDOH     Objective: Outpatient Encounter Medications as of 02/29/2020  Medication Sig  . acetaminophen (TYLENOL) 650 MG CR tablet Take 650 mg by mouth every 8 (eight) hours as needed for pain.  Marland Kitchen albuterol (VENTOLIN HFA) 108 (90 Base) MCG/ACT inhaler Inhale 2 puffs into the lungs every 6 (six) hours as needed for wheezing or shortness of breath (for rescue).  Marland Kitchen aspirin EC 325 MG tablet Take 325 mg by mouth every morning.   . colestipol (COLESTID) 1 g tablet Take 2 tablets (2 g total) by mouth 2 (two) times daily.  . diclofenac (VOLTAREN) 75 MG EC tablet Take 1 tablet (75 mg total) by mouth 2 (two) times daily.  . diclofenac sodium (VOLTAREN) 1 % GEL Apply 4 g topically 4 (four) times daily.  . furosemide (LASIX) 40 MG tablet TAKE 1 TABLET BY MOUTH ONCE DAILY AS NEEDED FOR EDEMA OR  FLUID  . gabapentin (NEURONTIN) 300 MG capsule TAKE 2 CAPSULES BY MOUTH IN THE MORNING AND 2 AT NOON AND 2 IN THE EVENING .  .  glimepiride (AMARYL) 2 MG tablet TAKE 1 TABLET BY MOUTH ONCE DAILY BEFORE BREAKFAST  . metFORMIN (GLUCOPHAGE) 500 MG tablet TAKE 2 TABLETS BY MOUTH TWICE DAILY WITH A MEAL  . metoprolol tartrate (LOPRESSOR) 25 MG tablet Take 1 tablet by mouth twice daily  . nitroGLYCERIN (NITROSTAT) 0.4 MG SL tablet See AUX Label  . omeprazole (PRILOSEC) 40 MG capsule TAKE 1 CAPSULE BY MOUTH ONCE DAILY 30  MINUTES  BEFORE  A  MEAL  . ONETOUCH DELICA LANCETS 33G MISC Check blood sugar BID and prn  E11.9  . ONETOUCH VERIO test strip USE 1 STRIP TO CHECK GLUCOSE TWICE DAILY AND AS NEEDED  . rosuvastatin (CRESTOR) 20 MG tablet TAKE 1 TABLET BY MOUTH AT BEDTIME  . telmisartan (MICARDIS) 40 MG tablet Take 1 tablet (40 mg total) by mouth daily.  . [DISCONTINUED] omeprazole (PRILOSEC) 40 MG capsule TAKE 1 CAPSULE BY MOUTH ONCE DAILY 30  MINUTES  BEFORE  A  MEAL   No facility-administered encounter medications on file as of 02/29/2020.    Lab Results  Component Value Date   HGBA1C 8.1 (H) 07/30/2019   HGBA1C 7.4 (H) 01/05/2019   HGBA1C 7.9 (H) 09/18/2018   Lab Results  Component Value Date   MICROALBUR neg 08/28/2014   LDLCALC 38 11/18/2017   CREATININE 1.13 06/05/2019   BP Readings from Last 3 Encounters:  08/31/19 136/83  07/30/19 126/70  06/05/19 116/60     RN Care Plan   . Chronic Disease  Management Needs       CARE PLAN ENTRY (see longtitudinal plan of care for additional care plan information)  Current Barriers:  . Chronic Disease Management support, education, and care coordination needs related to CAD, HTN, OSA, DM, arthritis, CKD, HLD  Clinical Goal(s) related to CAD, HTN, OSA, DM, arthritis, CKD, HLD:  Over the next 45 days, patient will:  . Work with the care management team to address educational, disease management, and care coordination needs  . Begin or continue self health monitoring activities as directed today Measure and record cbg (blood glucose) 1 times daily and Measure and  record blood pressure 3 times per week . Call provider office for new or worsened signs and symptoms Blood glucose findings outside established parameters and Blood pressure findings outside established parameters . Call care management team with questions or concerns . Verbalize basic understanding of patient centered plan of care established today  Interventions related to CAD, HTN, OSA, DM, arthritis, CKD, HLD:  . Evaluation of current treatment plans and patient's adherence to plan as established by provider . Assessed patient understanding of disease states . Assessed patient's education and care coordination needs . Provided disease specific education to patient  . Collaborated with appropriate clinical care team members regarding patient needs . Provided with CCM contact information and encouraged to reach out as needed . Scheduled appointment with PCP for a follow-up on her chronic disease management  Patient Self Care Activities related to CAD, HTN, OSA, DM, arthritis, CKD, HLD:  . Patient is unable to independently self-manage chronic health conditions  Initial goal documentation         Follow-up Plan:   The care management team will reach out to the patient again over the next 45 days to complete initial visit intake questions.   Chong Sicilian, BSN, RN-BC Embedded Chronic Care Manager [facility] [place] [facility] Direct Dial: [phone]

## 2020-03-03 ENCOUNTER — Other Ambulatory Visit: Payer: Self-pay

## 2020-03-03 ENCOUNTER — Ambulatory Visit (INDEPENDENT_AMBULATORY_CARE_PROVIDER_SITE_OTHER): Payer: Medicare Other | Admitting: Family Medicine

## 2020-03-03 ENCOUNTER — Encounter: Payer: Self-pay | Admitting: Family Medicine

## 2020-03-03 VITALS — BP 131/71 | HR 90 | Temp 97.1°F | Ht 62.0 in | Wt 334.8 lb

## 2020-03-03 DIAGNOSIS — F439 Reaction to severe stress, unspecified: Secondary | ICD-10-CM

## 2020-03-03 DIAGNOSIS — E1159 Type 2 diabetes mellitus with other circulatory complications: Secondary | ICD-10-CM

## 2020-03-03 DIAGNOSIS — E1142 Type 2 diabetes mellitus with diabetic polyneuropathy: Secondary | ICD-10-CM

## 2020-03-03 DIAGNOSIS — I152 Hypertension secondary to endocrine disorders: Secondary | ICD-10-CM

## 2020-03-03 DIAGNOSIS — E1169 Type 2 diabetes mellitus with other specified complication: Secondary | ICD-10-CM | POA: Diagnosis not present

## 2020-03-03 DIAGNOSIS — I10 Essential (primary) hypertension: Secondary | ICD-10-CM

## 2020-03-03 DIAGNOSIS — Z114 Encounter for screening for human immunodeficiency virus [HIV]: Secondary | ICD-10-CM | POA: Diagnosis not present

## 2020-03-03 DIAGNOSIS — E785 Hyperlipidemia, unspecified: Secondary | ICD-10-CM

## 2020-03-03 LAB — BAYER DCA HB A1C WAIVED: HB A1C (BAYER DCA - WAIVED): 10 % — ABNORMAL HIGH (ref <7.0)

## 2020-03-03 NOTE — Progress Notes (Signed)
Subjective: CC: Follow up type 2 diabetes PCP: Janora Norlander, DO [name] is a 59 y.o. female presenting to clinic today for:  1.  Type 2 Diabetes with hypertension and hyperlipidemia:  Patient reports that she is only intermittently checking blood sugars but they have been ranging anywhere between 150 and 300s.  She has not been treated with steroids since our last visit.  She reports compliance with Amaryl 2 mg daily and metformin 1000 mg twice daily.  Jardiance was unaffordable at last visit so it was replaced with Januvia, which apparently was also not covered by insurance.  She notes that she tried to contact her insurance to get alternatives but she waited on hold for over 30 minutes and then when she called back she was hung up on.  She just "gave up".  Compliant with her antihypertensives of Micardis 40 mg daily, Lopressor 25 mg twice daily and cholesterol medicine Crestor 20 mg daily. She has had diarrhea with all meals.  She has been stress eating.  She reports a lot of anxiety and moodiness surrounding her husband's health and that he is very dependent on her and she "can't do things like she used to".  She is tearful about this.  Last eye exam: Needs Last foot exam: UTD Last A1c:  Lab Results  Component Value Date   HGBA1C 8.1 (H) 07/30/2019   Nephropathy screen indicated?:  On ARB Last flu, zoster and/or pneumovax:  Immunization History  Administered Date(s) Administered  . Influenza,inj,Quad PF,6+ Mos 08/10/2013, 08/28/2014, 09/25/2015, 11/23/2017, 11/27/2018  . Pneumococcal Conjugate-13 11/27/2018    ROS: Denies dizziness, CP, SOB.  No visual disturbance.   ROS: Per HPI  Allergies  Allergen Reactions  . Morphine And Related Nausea And Vomiting   Past Medical History:  Diagnosis Date  . Arthritis   . CAD (coronary artery disease)    BMS to circumflex 2007 - Dr. Olevia Perches  . CKD (chronic kidney disease) stage 3, GFR 30-59 ml/min   . Essential  hypertension   . Falls frequently   . GERD (gastroesophageal reflux disease)   . HA (headache)   . Hyperlipidemia   . Left-sided face pain   . Morbid obesity ([place])   . Noncompliance   . OSA (obstructive sleep apnea)    Uses CPAP  . Type 2 diabetes mellitus ([place])   . Urinary incontinence     Current Outpatient Medications:  .  acetaminophen (TYLENOL) 650 MG CR tablet, Take 650 mg by mouth every 8 (eight) hours as needed for pain., Disp: , Rfl:  .  albuterol (VENTOLIN HFA) 108 (90 Base) MCG/ACT inhaler, Inhale 2 puffs into the lungs every 6 (six) hours as needed for wheezing or shortness of breath (for rescue)., Disp: 1 Inhaler, Rfl: 0 .  aspirin EC 325 MG tablet, Take 325 mg by mouth every morning. , Disp: , Rfl:  .  colestipol (COLESTID) 1 g tablet, Take 2 tablets (2 g total) by mouth 2 (two) times daily., Disp: 120 tablet, Rfl: 4 .  diclofenac (VOLTAREN) 75 MG EC tablet, Take 1 tablet (75 mg total) by mouth 2 (two) times daily., Disp: 30 tablet, Rfl: 0 .  diclofenac sodium (VOLTAREN) 1 % GEL, Apply 4 g topically 4 (four) times daily., Disp: 400 g, Rfl: 2 .  furosemide (LASIX) 40 MG tablet, TAKE 1 TABLET BY MOUTH ONCE DAILY AS NEEDED FOR EDEMA OR  FLUID, Disp: 90 tablet, Rfl: 0 .  gabapentin (NEURONTIN) 300 MG capsule, TAKE  2 CAPSULES BY MOUTH IN THE MORNING AND 2 AT NOON AND 2 IN THE EVENING ., Disp: 360 capsule, Rfl: 0 .  glimepiride (AMARYL) 2 MG tablet, TAKE 1 TABLET BY MOUTH ONCE DAILY BEFORE BREAKFAST, Disp: 90 tablet, Rfl: 0 .  metFORMIN (GLUCOPHAGE) 500 MG tablet, TAKE 2 TABLETS BY MOUTH TWICE DAILY WITH A MEAL, Disp: 360 tablet, Rfl: 0 .  metoprolol tartrate (LOPRESSOR) 25 MG tablet, Take 1 tablet by mouth twice daily, Disp: 180 tablet, Rfl: 0 .  nitroGLYCERIN (NITROSTAT) 0.4 MG SL tablet, See AUX Label, Disp: 12 tablet, Rfl: 0 .  omeprazole (PRILOSEC) 40 MG capsule, TAKE 1 CAPSULE BY MOUTH ONCE DAILY 30  MINUTES  BEFORE  A  MEAL, Disp: 90 capsule, Rfl: 1 .  ONETOUCH DELICA  LANCETS 33A MISC, Check blood sugar BID and prn  E11.9, Disp: 100 each, Rfl: 11 .  ONETOUCH VERIO test strip, USE 1 STRIP TO CHECK GLUCOSE TWICE DAILY AND AS NEEDED, Disp: 100 each, Rfl: 5 .  rosuvastatin (CRESTOR) 20 MG tablet, TAKE 1 TABLET BY MOUTH AT BEDTIME, Disp: 90 tablet, Rfl: 0 .  telmisartan (MICARDIS) 40 MG tablet, Take 1 tablet (40 mg total) by mouth daily., Disp: 30 tablet, Rfl: 11 Social History   Socioeconomic History  . Marital status: Married    Spouse name: Gwyndolyn Saxon  . Number of children: 5  . Years of education: 69 th  . Highest education level: 10th grade  Occupational History  . Occupation: disabled     Comment: disabled  Tobacco Use  . Smoking status: Current Every Day Smoker    Packs/day: 1.00    Years: 47.00    Pack years: 47.00    Types: Cigarettes    Start date: 05/14/1972  . Smokeless tobacco: Never Used  Substance and Sexual Activity  . Alcohol use: No    Alcohol/week: 0.0 standard drinks  . Drug use: No  . Sexual activity: Not Currently    Birth control/protection: Surgical  Other Topics Concern  . Not on file  Social History Narrative   Patient lives at home with her husband and grandchild Gwyndolyn Saxon). Patient is disabled.   Patient has 10 th grade education.   Right handed.   Caffeine- one  cup of coffee and  One soda Dr.Pepper/ tea. daily   Social Determinants of Health   Financial Resource Strain: Medium Risk  . Difficulty of Paying Living Expenses: Somewhat hard  Food Insecurity: No Food Insecurity  . Worried About Charity fundraiser in the Last Year: Never true  . Ran Out of Food in the Last Year: Never true  Transportation Needs: No Transportation Needs  . Lack of Transportation (Medical): No  . Lack of Transportation (Non-Medical): No  Physical Activity: Inactive  . Days of Exercise per Week: 0 days  . Minutes of Exercise per Session: 0 min  Stress: Stress Concern Present  . Feeling of Stress : To some extent  Social Connections:  Somewhat Isolated  . Frequency of Communication with Friends and Family: More than three times a week  . Frequency of Social Gatherings with Friends and Family: More than three times a week  . Attends Religious Services: Never  . Active Member of Clubs or Organizations: No  . Attends Archivist Meetings: Never  . Marital Status: Married  Human resources officer Violence: Not At Risk  . Fear of Current or Ex-Partner: No  . Emotionally Abused: No  . Physically Abused: No  . Sexually Abused: No  Family History  Problem Relation Age of Onset  . Coronary artery disease Father   . Cancer - Colon Father   . Diabetes Father   . High Cholesterol Father   . Breast cancer Mother   . Arthritis Sister   . Asthma Sister   . High Cholesterol Daughter   . Thyroid disease Daughter   . Hiatal hernia Son   . Congenital heart disease Son     Objective: Office vital signs reviewed. BP 131/71   Pulse 90   Temp (!) 97.1 F (36.2 C)   Ht '5\' 2"'$  (1.575 m)   Wt (!) 334 lb 12.8 oz (151.9 kg)   SpO2 99%   BMI 61.24 kg/m   Physical Examination:  General: Awake, alert, morbidly obese, depressed appearing HEENT: Normal, sclera white, MMM Cardio: regular rate and rhythm, S1S2 heard, no murmurs appreciated Pulm: clear to auscultation bilaterally, no wheezes, rhonchi or rales; normal work of breathing on room air Extremities: warm, well perfused, nonpitting LE edema. No cyanosis or clubbing; +1 pulses bilaterally MSK: antalgic gait, uses cane for ambulation Psych: tearful, mood depressed.  Depression screen [facility] 2/9 03/03/2020 03/03/2020 12/21/2019  Decreased Interest 3 0 0  Down, Depressed, Hopeless 1 0 0  PHQ - 2 Score 4 0 0  Altered sleeping 1 - -  Tired, decreased energy 3 - -  Change in appetite 1 - -  Feeling bad or failure about yourself  3 - -  Trouble concentrating 1 - -  Moving slowly or fidgety/restless 3 - -  Suicidal thoughts 0 - -  PHQ-9 Score 16 - -  Difficult doing  work/chores Somewhat difficult - -  Some recent data might be hidden   GAD 7 : Generalized Anxiety Score 03/03/2020  Nervous, Anxious, on Edge 1  Control/stop worrying 1  Worry too much - different things 3  Trouble relaxing 3  Restless 0  Easily annoyed or irritable 1  Afraid - awful might happen 1  Total GAD 7 Score 10  Anxiety Difficulty Somewhat difficult     Assessment/ Plan: 59 y.o. female   1. Type 2 diabetes mellitus with diabetic polyneuropathy, without long-term current use of insulin (HCC) Sugar uncontrolled.  A1c up to 10.0.  She was prescribed Jardiance and Januvia neither of which her insurance covers.  I will see if Almyra Free our pharmacist might be able to help with affordability vs helping select one of the injectables that may be more affordable.  We discussed risks of uncontrolled BGs.  Dietary counseling performed today.  If we can find an affordable medication, plan to back down on Metformin to '750mg'$  to see if this will help with diarrheal symptoms. - Bayer DCA Hb A1c Waived - Lipid Panel  2. Hypertension associated with diabetes ([place]) Controlled. - CMP14+EGFR  3. Hyperlipidemia associated with type 2 diabetes mellitus (HCC) Check nonfasting lipid - CMP14+EGFR - Lipid Panel  4. Screening for HIV (human immunodeficiency virus) - HIV antibody (with reflex)  5. Stress at home Offered counseling, medication today but patient declines.  She wants to see if situation improves.  I have asked her to please reach out to me if symptoms are not getting better after her husband's surgery.  Would consider Prozac.   Orders Placed This Encounter  Procedures  . HIV antibody (with reflex)  . Bayer DCA Hb A1c Waived  . CMP14+EGFR  . Lipid Panel   No orders of the defined types were placed in this encounter.  Janora Norlander, DO [facility] [phone]

## 2020-03-04 LAB — CMP14+EGFR
ALT: 21 IU/L (ref 0–32)
AST: 20 IU/L (ref 0–40)
Albumin/Globulin Ratio: 1.5 (ref 1.2–2.2)
Albumin: 3.9 g/dL (ref 3.8–4.9)
Alkaline Phosphatase: 122 IU/L — ABNORMAL HIGH (ref 39–117)
BUN/Creatinine Ratio: 11 — ABNORMAL LOW (ref 12–28)
BUN: 13 mg/dL (ref 8–27)
Bilirubin Total: 0.2 mg/dL (ref 0.0–1.2)
CO2: 22 mmol/L (ref 20–29)
Calcium: 9.1 mg/dL (ref 8.7–10.3)
Chloride: 104 mmol/L (ref 96–106)
Creatinine, Ser: 1.14 mg/dL — ABNORMAL HIGH (ref 0.57–1.00)
GFR calc Af Amer: 60 mL/min/{1.73_m2} (ref >59)
GFR calc non Af Amer: 52 mL/min/{1.73_m2} — ABNORMAL LOW (ref >59)
Globulin, Total: 2.6 g/dL (ref 1.5–4.5)
Glucose: 183 mg/dL — ABNORMAL HIGH (ref 65–99)
Potassium: 4.3 mmol/L (ref 3.5–5.2)
Sodium: 140 mmol/L (ref 134–144)
Total Protein: 6.5 g/dL (ref 6.0–8.5)

## 2020-03-04 LAB — LIPID PANEL
Chol/HDL Ratio: 2.8 ratio (ref 0.0–4.4)
Cholesterol, Total: 105 mg/dL (ref 100–199)
HDL: 37 mg/dL — ABNORMAL LOW (ref >39)
LDL Chol Calc (NIH): 41 mg/dL (ref 0–99)
Triglycerides: 161 mg/dL — ABNORMAL HIGH (ref 0–149)
VLDL Cholesterol Cal: 27 mg/dL (ref 5–40)

## 2020-03-04 LAB — HIV ANTIBODY (ROUTINE TESTING W REFLEX): HIV Screen 4th Generation wRfx: NONREACTIVE

## 2020-03-11 ENCOUNTER — Ambulatory Visit (INDEPENDENT_AMBULATORY_CARE_PROVIDER_SITE_OTHER): Payer: Medicare Other | Admitting: Pharmacist

## 2020-03-11 ENCOUNTER — Other Ambulatory Visit: Payer: Self-pay

## 2020-03-11 ENCOUNTER — Other Ambulatory Visit: Payer: Self-pay | Admitting: Family Medicine

## 2020-03-11 DIAGNOSIS — R600 Localized edema: Secondary | ICD-10-CM

## 2020-03-11 DIAGNOSIS — E1142 Type 2 diabetes mellitus with diabetic polyneuropathy: Secondary | ICD-10-CM

## 2020-03-11 NOTE — Progress Notes (Signed)
Patient seen pharmacist today for blood sugar.  She is noted to have increasing lower extremity edema.  She has baseline dyspnea on exertion.  Has not seen her cardiologist in a while now.  Takes Lasix 40 mg daily as needed.  Advised to increase to twice daily for the next 3 days.  Come in on Friday for BMP check.  Keep legs elevated.  Restrict sodium.

## 2020-03-17 ENCOUNTER — Telehealth: Payer: Medicare Other

## 2020-03-21 ENCOUNTER — Other Ambulatory Visit: Payer: Self-pay | Admitting: Family Medicine

## 2020-03-21 DIAGNOSIS — E1142 Type 2 diabetes mellitus with diabetic polyneuropathy: Secondary | ICD-10-CM

## 2020-03-25 ENCOUNTER — Telehealth: Payer: Self-pay | Admitting: Pharmacist

## 2020-03-25 DIAGNOSIS — E1142 Type 2 diabetes mellitus with diabetic polyneuropathy: Secondary | ICD-10-CM

## 2020-03-25 NOTE — Progress Notes (Signed)
      03/12/2020 Name: [name] MRN: [id number] DOB: 10-Feb-1961  Referred by: [name], DO Reason for referral : Diabetes  S:  32 yoF Presents for diabetes evaluation, education, and management. Patient was referred and last seen by Primary Care Provider on 03/03/20. Insurance coverage/medication affordability: Tier 4 LIS- [facility] medicare   Patient reports adherence with medications.  Current diabetes medications include: metformin, glimepiride (limited d/t cost)  Current hypertension medications include: metoprolol, telmisartan  Current hyperlipidemia medications include: rosuvastatin   Patient denies hypoglycemic events.   Patient-reported exercise habits: n/a  Patient reports nocturia (nighttime urination).  Patient reports neuropathy (nerve pain).  Patient denies visual changes.  Patient reports self foot exams.    O:    Lab Results  Component Value Date   HGBA1C 10.0 (H) 03/03/2020     Lipid Panel     Component Value Date/Time   CHOL 105 03/03/2020 1051   TRIG 161 (H) 03/03/2020 1051   TRIG 211 (H) 12/30/2014 1537   HDL 37 (L) 03/03/2020 1051   HDL 48 12/30/2014 1537   CHOLHDL 2.8 03/03/2020 1051   CHOLHDL 4.5 04/09/2013 1135   VLDL 37 04/09/2013 1135   LDLCALC 41 03/03/2020 1051   LDLCALC 42 08/28/2014 1037     Home fasting blood sugars: not checking, doesn't have meter  2 hour post-meal/random blood sugars: n/a    A/P:  Diabetes T2DM currently uncontrolled. Patient is able to verbalize appropriate hypoglycemia management plan. Patient is adherent with medication. Control is suboptimal due to cost of medication/diet.  -Continue metformin  -Started GLP-1 Trulicity (generic name:dulaglutide) 0.75 mg.  Will increase to 1.5mg  weekly once patient assistance paper work is filled. Patient to bring in financials.  -Extensively discussed pathophysiology of diabetes, recommended lifestyle interventions, dietary effects on blood sugar  control  -Counseled on s/sx of and management of hypoglycemia  -Next A1C anticipated 3 months    Written patient instructions provided.  Total time in face to face counseling 25 minutes.   Follow up Pharmacist Clinic Visit in 4 weeks  Kieth Brightly, PharmD, BCPS Clinical Pharmacist, [facility]  II Phone [phone]

## 2020-03-26 MED ORDER — ONETOUCH DELICA LANCETS 33G MISC: 11 refills | Status: DC

## 2020-03-26 MED ORDER — ONETOUCH DELICA LANCETS 30G MISC: 3 refills | Status: DC

## 2020-03-26 MED ORDER — ONETOUCH VERIO W/DEVICE KIT: PACK | 1 refills | Status: DC

## 2020-03-26 MED ORDER — ONETOUCH VERIO VI STRP: ORAL_STRIP | 5 refills | Status: DC

## 2020-03-26 NOTE — Telephone Encounter (Signed)
Patient requesting PharmD appt to review medications (scheduled) She will need GLP1 sample at her visit  Patient assistance pending due to incomplete application--patient to bring in financials Glucometer called in per patient request--she has been unable to check her BGs, but "feels better"

## 2020-03-27 ENCOUNTER — Other Ambulatory Visit: Payer: Self-pay

## 2020-03-27 ENCOUNTER — Ambulatory Visit (INDEPENDENT_AMBULATORY_CARE_PROVIDER_SITE_OTHER): Payer: Medicare Other | Admitting: Pharmacist

## 2020-03-27 DIAGNOSIS — E1142 Type 2 diabetes mellitus with diabetic polyneuropathy: Secondary | ICD-10-CM

## 2020-03-27 MED ORDER — TRULICITY 0.75 MG/0.5ML ~~LOC~~ SOAJ: 0.7500 mg | SUBCUTANEOUS | 3 refills | Status: DC

## 2020-03-27 MED ORDER — METFORMIN HCL ER 500 MG PO TB24: 1000.0000 mg | ORAL_TABLET | Freq: Two times a day (BID) | ORAL | 3 refills | Status: DC

## 2020-03-27 NOTE — Progress Notes (Signed)
   03/27/2020 Name: [name] MRN: [id number] DOB: 12-18-1960  Referred by: Janora Norlander, DO Reason for referral : T2DM   S:  S:  34 yoF Presents for diabetes evaluation, education, and management. Patient was referred and last seen by Primary Care Provider on 03/03/20. Insurance coverage/medication affordability: Tier 4 LIS- [facility] medicare   Patient reports adherence with medications.  Current diabetes medications include: metformin, glimepiride (limited d/t cost), ozempic x3 weeks  Current hypertension medications include: metoprolol, telmisartan  Current hyperlipidemia medications include: rosuvastatin   Patient denies hypoglycemic events.   Patient-reported exercise habits: n/a  Patient reports nocturia (nighttime urination).  Patient reports neuropathy (nerve pain).  Patient denies visual changes.  Patient reports self foot exams.  Patient-reported exercise habits: n/a  O:   Lab Results  Component Value Date   HGBA1C 10.0 (H) 03/03/2020   Lipid Panel     Component Value Date/Time   CHOL 105 03/03/2020 1051   TRIG 161 (H) 03/03/2020 1051   TRIG 211 (H) 12/30/2014 1537   HDL 37 (L) 03/03/2020 1051   HDL 48 12/30/2014 1537   CHOLHDL 2.8 03/03/2020 1051   CHOLHDL 4.5 04/09/2013 1135   VLDL 37 04/09/2013 1135   LDLCALC 41 03/03/2020 1051   LDLCALC 42 08/28/2014 1037     Home fasting blood sugars: not checking, doesn't have meter  2 hour post-meal/random blood sugars: n/a    A/P:  Diabetes T2DM currently uncontrolled. Patient is able to verbalize appropriate hypoglycemia management plan. Patient is adherent with medication. Control is suboptimal due to cost of medication/diet.  -Change metformin to metformin XR to reduce GI distress--pt admits to only taking 1 tablet twice daily vs 2 tablets twice daily.  She cannot tolerate.  -Continue glimepiride  -Started GLP-1 Trulicity (generic name:dulaglutide) 0.75 mg.  Will increase to 1.'5mg'$   weekly once patient assistance paper work is filed. Patient brought in financials today, will get PCP to sign  -One touch verio glucometer called in to [facility]--they have to order.  Show patient the meter and how to use (called in kit, test strips and lancets)  -Extensively discussed pathophysiology of diabetes, recommended lifestyle interventions, dietary effects on blood sugar control  -Counseled on s/sx of and management of hypoglycemia  -Next A1C anticipated July 2021    Written patient instructions provided.  Total time in face to face counseling 25 minutes.   Follow up Pharmacist Clinic Visit in 4 weeks  Regina Eck, PharmD, BCPS Clinical Pharmacist, [place]  II Phone [phone]

## 2020-03-27 NOTE — Addendum Note (Signed)
Addended by: Margorie John on: 03/27/2020 03:08 PM   Modules accepted: Orders

## 2020-03-28 LAB — BASIC METABOLIC PANEL
BUN/Creatinine Ratio: 10 — ABNORMAL LOW (ref 12–28)
BUN: 10 mg/dL (ref 8–27)
CO2: 21 mmol/L (ref 20–29)
Calcium: 9.5 mg/dL (ref 8.7–10.3)
Chloride: 103 mmol/L (ref 96–106)
Creatinine, Ser: 0.99 mg/dL (ref 0.57–1.00)
GFR calc Af Amer: 72 mL/min/{1.73_m2} (ref >59)
GFR calc non Af Amer: 62 mL/min/{1.73_m2} (ref >59)
Glucose: 127 mg/dL — ABNORMAL HIGH (ref 65–99)
Potassium: 4.1 mmol/L (ref 3.5–5.2)
Sodium: 141 mmol/L (ref 134–144)

## 2020-03-30 ENCOUNTER — Other Ambulatory Visit: Payer: Self-pay | Admitting: Family Medicine

## 2020-03-30 DIAGNOSIS — I152 Hypertension secondary to endocrine disorders: Secondary | ICD-10-CM

## 2020-03-30 DIAGNOSIS — E1159 Type 2 diabetes mellitus with other circulatory complications: Secondary | ICD-10-CM

## 2020-03-30 DIAGNOSIS — E1142 Type 2 diabetes mellitus with diabetic polyneuropathy: Secondary | ICD-10-CM

## 2020-04-03 ENCOUNTER — Ambulatory Visit (INDEPENDENT_AMBULATORY_CARE_PROVIDER_SITE_OTHER): Payer: Medicare Other | Admitting: *Deleted

## 2020-04-03 DIAGNOSIS — E1142 Type 2 diabetes mellitus with diabetic polyneuropathy: Secondary | ICD-10-CM

## 2020-04-03 NOTE — Chronic Care Management (AMB) (Signed)
Chronic Care Management   Follow Up Note   04/03/2020 Name: [name] MRN: [id number] DOB: 1961/07/15  Referred by: Janora Norlander, DO Reason for referral : Chronic Care Management (RN follow up)   [name] is a 59 y.o. year old female who is a primary care patient of Janora Norlander, DO. The CCM team was consulted for assistance with chronic disease management and care coordination needs.    Review of patient status, including review of consultants reports, relevant laboratory and other test results, and collaboration with appropriate care team members and the patient's provider was performed as part of comprehensive patient evaluation and provision of chronic care management services.    SDOH (Social Determinants of Health) assessments performed: No See Care Plan activities for detailed interventions related to [facility])     Outpatient Encounter Medications as of 04/03/2020  Medication Sig  . acetaminophen (TYLENOL) 650 MG CR tablet Take 650 mg by mouth every 8 (eight) hours as needed for pain.  Marland Kitchen albuterol (VENTOLIN HFA) 108 (90 Base) MCG/ACT inhaler Inhale 2 puffs into the lungs every 6 (six) hours as needed for wheezing or shortness of breath (for rescue).  Marland Kitchen aspirin EC 325 MG tablet Take 325 mg by mouth every morning.   . Blood Glucose Monitoring Suppl (ONETOUCH VERIO) w/Device KIT Use to test blood sugar twice daily as directed; DX: E11.69  . colestipol (COLESTID) 1 g tablet Take 2 tablets (2 g total) by mouth 2 (two) times daily.  . diclofenac (VOLTAREN) 75 MG EC tablet Take 1 tablet (75 mg total) by mouth 2 (two) times daily.  . diclofenac sodium (VOLTAREN) 1 % GEL Apply 4 g topically 4 (four) times daily.  . Dulaglutide (TRULICITY) 8.82 CM/0.3KJ SOPN Inject 0.75 mg into the skin once a week.  . furosemide (LASIX) 40 MG tablet TAKE 1 TABLET BY MOUTH ONCE DAILY AS NEEDED FOR EDEMA OR  FLUID  . gabapentin (NEURONTIN) 300 MG capsule TAKE 2 CAPSULES BY MOUTH IN THE  MORNING AND 2 AT NOON AND 2 IN THE EVENING  . glimepiride (AMARYL) 2 MG tablet TAKE 1 TABLET BY MOUTH ONCE DAILY BEFORE BREAKFAST  . glucose blood (ONETOUCH VERIO) test strip Use to test blood sugar twice daily as directed; DX: E11.69  . metFORMIN (GLUCOPHAGE XR) 500 MG 24 hr tablet Take 2 tablets (1,000 mg total) by mouth 2 (two) times daily with a meal.  . metoprolol tartrate (LOPRESSOR) 25 MG tablet Take 1 tablet by mouth twice daily  . nitroGLYCERIN (NITROSTAT) 0.4 MG SL tablet See AUX Label  . omeprazole (PRILOSEC) 40 MG capsule TAKE 1 CAPSULE BY MOUTH ONCE DAILY 30  MINUTES  BEFORE  A  MEAL  . OneTouch Delica Lancets 17H MISC Use to test blood sugar twice daily as directed; DX: E11.69  . OneTouch Delica Lancets 15A MISC Check blood sugar BID and prn  E11.9  . rosuvastatin (CRESTOR) 20 MG tablet TAKE 1 TABLET BY MOUTH AT BEDTIME  . telmisartan (MICARDIS) 40 MG tablet Take 1 tablet (40 mg total) by mouth daily.  . [DISCONTINUED] omeprazole (PRILOSEC) 40 MG capsule TAKE 1 CAPSULE BY MOUTH ONCE DAILY 30  MINUTES  BEFORE  A  MEAL   No facility-administered encounter medications on file as of 04/03/2020.     Lab Results  Component Value Date   HGBA1C 10.0 (H) 03/03/2020   HGBA1C 8.1 (H) 07/30/2019   HGBA1C 7.4 (H) 01/05/2019   Lab Results  Component Value Date  MICROALBUR neg 08/28/2014   LDLCALC 41 03/03/2020   CREATININE 0.99 03/27/2020    RN Care Plan   . "I want to manage my blood sugar better" (pt-stated)       CARE PLAN ENTRY (see longitudinal plan of care for additional care plan information)  Current Barriers:  . Chronic Disease Management support and education needs related to diabetes  Nurse Case Manager Clinical Goal(s):  Marland Kitchen Over the next 30 days, patient will work with [facility] PharmD to address needs related to diabetes management . Over the next 60 days, patient will work with Consulting civil engineer to coordinate care regarding diabetes management  Interventions:   . Inter-disciplinary care team collaboration (see longitudinal plan of care) . Chart reviewed including recent office notes and lab results . Talked with patient by telephone . Reviewed and discussed medications o Was unable to purchase Trulicity at [facility] due to cost o Has not picked up Metformin XR or glucometer yet but plans to today o She was approved for prescription assistance for Trulicity and expects delivery on 04/09/20 o Continues to have some abdominal cramping for several days after Trulicity injection. Has discussed with PharmD. Will f/u with PharmD if symptoms persist or worsen. . Discussed that patient is considering switching over to [facility] from [place] . Collaboration with PharmD to provide an update . Encouraged patient to check and record blood sugar twice daily as directed once she receives the glucometer . Notify PCP office [phone] of any readings outside of recommended range . Provided with CCM contact information and encouraged to reach out as needed  Patient Self Care Activities:  . Performs ADL's independently . Performs IADL's independently  Initial goal documentation         Plan:   The care management team will reach out to the patient again over the next 45 days.    Chong Sicilian, BSN, RN-BC Embedded Chronic Care Manager [facility] [place] [facility] Direct Dial: [phone]

## 2020-04-03 NOTE — Patient Instructions (Signed)
Visit Information  Goals Addressed            This Visit's Progress     Patient Stated   . "I want to manage my blood sugar better" (pt-stated)       CARE PLAN ENTRY (see longitudinal plan of care for additional care plan information)  Current Barriers:  . Chronic Disease Management support and education needs related to diabetes  Nurse Case Manager Clinical Goal(s):  Marland Kitchen Over the next 30 days, patient will work with [facility] PharmD to address needs related to diabetes management . Over the next 60 days, patient will work with Medical illustrator to coordinate care regarding diabetes management  Interventions:  . Inter-disciplinary care team collaboration (see longitudinal plan of care) . Chart reviewed including recent office notes and lab results . Talked with patient by telephone . Reviewed and discussed medications o Was unable to purchase Trulicity at [facility] due to cost o Has not picked up Metformin XR or glucometer yet but plans to today o She was approved for prescription assistance for Trulicity and expects delivery on 04/09/20 o Continues to have some abdominal cramping for several days after Trulicity injection. Has discussed with PharmD. Will f/u with PharmD if symptoms persist or worsen. . Discussed that patient is considering switching over to [facility] from [place] . Collaboration with PharmD to provide an update . Encouraged patient to check and record blood sugar twice daily as directed once she receives the glucometer . Notify PCP office [phone] of any readings outside of recommended range . Provided with CCM contact information and encouraged to reach out as needed  Patient Self Care Activities:  . Performs ADL's independently . Performs IADL's independently  Initial goal documentation        The patient verbalized understanding of instructions provided today and declined a print copy of patient instruction materials.   Follow-up Plan The care  management team will reach out to the patient again over the next 45 days.   Demetrios Loll, BSN, RN-BC Embedded Chronic Care Manager [facility] Care Management Direct Dial: [phone]

## 2020-05-21 ENCOUNTER — Other Ambulatory Visit: Payer: Self-pay | Admitting: Family Medicine

## 2020-05-21 DIAGNOSIS — E1169 Type 2 diabetes mellitus with other specified complication: Secondary | ICD-10-CM

## 2020-05-21 DIAGNOSIS — E785 Hyperlipidemia, unspecified: Secondary | ICD-10-CM

## 2020-05-26 ENCOUNTER — Other Ambulatory Visit: Payer: Self-pay | Admitting: Internal Medicine

## 2020-05-26 ENCOUNTER — Other Ambulatory Visit: Payer: Self-pay | Admitting: Family Medicine

## 2020-05-26 DIAGNOSIS — E1142 Type 2 diabetes mellitus with diabetic polyneuropathy: Secondary | ICD-10-CM

## 2020-05-26 DIAGNOSIS — E785 Hyperlipidemia, unspecified: Secondary | ICD-10-CM

## 2020-05-26 DIAGNOSIS — K219 Gastro-esophageal reflux disease without esophagitis: Secondary | ICD-10-CM

## 2020-05-26 DIAGNOSIS — E1169 Type 2 diabetes mellitus with other specified complication: Secondary | ICD-10-CM

## 2020-05-28 ENCOUNTER — Other Ambulatory Visit: Payer: Self-pay | Admitting: Internal Medicine

## 2020-05-30 ENCOUNTER — Other Ambulatory Visit: Payer: Self-pay | Admitting: Internal Medicine

## 2020-06-03 ENCOUNTER — Other Ambulatory Visit: Payer: Self-pay | Admitting: Internal Medicine

## 2020-06-05 ENCOUNTER — Telehealth: Payer: Self-pay | Admitting: Internal Medicine

## 2020-06-05 NOTE — Telephone Encounter (Signed)
Attempted to call pt but unable to reach. Left message for her to return call. Once pt returns call and after we verify pharmacy, med will be sent in for her.

## 2020-06-05 NOTE — Telephone Encounter (Signed)
Pt has not been seen since 06/05/19. Pt does have an upcoming appt with MW 8/24.  Pt is requesting a refill of telmisartan. Dr. Sherene Sires, please advise if you are okay with [place] refilling med or if this needs to be deferred to pt's PCP

## 2020-06-05 NOTE — Telephone Encounter (Signed)
Ok to refill x one month and we'll discuss at ov whether to stay on long term and who will refill

## 2020-06-09 MED ORDER — TELMISARTAN 40 MG PO TABS: 40.0000 mg | ORAL_TABLET | Freq: Every day | ORAL | 0 refills | Status: DC

## 2020-06-09 NOTE — Telephone Encounter (Signed)
Called and spoke with patient she verified medication she needs refilled and pharmacy. Also informed her that Dr. Sherene Sires was doing a 1 month refill and wanted to discuss this further at her appointment on 8/24 at 11 so for her to keep that appointment with our office. Patient expressed understanding. Nothing further needed at this time.

## 2020-06-10 ENCOUNTER — Other Ambulatory Visit: Payer: Self-pay | Admitting: Family Medicine

## 2020-06-10 DIAGNOSIS — E1142 Type 2 diabetes mellitus with diabetic polyneuropathy: Secondary | ICD-10-CM

## 2020-06-18 ENCOUNTER — Other Ambulatory Visit: Payer: Self-pay

## 2020-06-18 ENCOUNTER — Observation Stay (HOSPITAL_BASED_OUTPATIENT_CLINIC_OR_DEPARTMENT_OTHER): Payer: Medicare Other

## 2020-06-18 ENCOUNTER — Encounter (HOSPITAL_COMMUNITY): Payer: Self-pay

## 2020-06-18 ENCOUNTER — Observation Stay (HOSPITAL_COMMUNITY)
Admission: EM | Admit: 2020-06-18 | Discharge: 2020-06-19 | Disposition: A | Discharge status: Home / Self Care | Payer: Medicare Other | Attending: Cardiology | Admitting: Cardiology

## 2020-06-18 ENCOUNTER — Emergency Department (HOSPITAL_COMMUNITY): Payer: Medicare Other

## 2020-06-18 DIAGNOSIS — F17210 Nicotine dependence, cigarettes, uncomplicated: Secondary | ICD-10-CM | POA: Diagnosis present

## 2020-06-18 DIAGNOSIS — I4510 Unspecified right bundle-branch block: Secondary | ICD-10-CM | POA: Diagnosis not present

## 2020-06-18 DIAGNOSIS — Z79899 Other long term (current) drug therapy: Secondary | ICD-10-CM | POA: Insufficient documentation

## 2020-06-18 DIAGNOSIS — E113293 Type 2 diabetes mellitus with mild nonproliferative diabetic retinopathy without macular edema, bilateral: Secondary | ICD-10-CM | POA: Insufficient documentation

## 2020-06-18 DIAGNOSIS — I340 Nonrheumatic mitral (valve) insufficiency: Secondary | ICD-10-CM

## 2020-06-18 DIAGNOSIS — Z7982 Long term (current) use of aspirin: Secondary | ICD-10-CM | POA: Diagnosis not present

## 2020-06-18 DIAGNOSIS — R079 Chest pain, unspecified: Secondary | ICD-10-CM | POA: Diagnosis not present

## 2020-06-18 DIAGNOSIS — I200 Unstable angina: Secondary | ICD-10-CM | POA: Diagnosis not present

## 2020-06-18 DIAGNOSIS — R2243 Localized swelling, mass and lump, lower limb, bilateral: Secondary | ICD-10-CM | POA: Diagnosis not present

## 2020-06-18 DIAGNOSIS — E1169 Type 2 diabetes mellitus with other specified complication: Secondary | ICD-10-CM | POA: Diagnosis present

## 2020-06-18 DIAGNOSIS — Z20822 Contact with and (suspected) exposure to covid-19: Secondary | ICD-10-CM | POA: Insufficient documentation

## 2020-06-18 DIAGNOSIS — E113393 Type 2 diabetes mellitus with moderate nonproliferative diabetic retinopathy without macular edema, bilateral: Secondary | ICD-10-CM | POA: Diagnosis present

## 2020-06-18 DIAGNOSIS — R0789 Other chest pain: Secondary | ICD-10-CM | POA: Diagnosis not present

## 2020-06-18 DIAGNOSIS — I152 Hypertension secondary to endocrine disorders: Secondary | ICD-10-CM | POA: Diagnosis present

## 2020-06-18 DIAGNOSIS — N183 Chronic kidney disease, stage 3 unspecified: Secondary | ICD-10-CM | POA: Diagnosis not present

## 2020-06-18 DIAGNOSIS — I129 Hypertensive chronic kidney disease with stage 1 through stage 4 chronic kidney disease, or unspecified chronic kidney disease: Secondary | ICD-10-CM | POA: Insufficient documentation

## 2020-06-18 DIAGNOSIS — G4733 Obstructive sleep apnea (adult) (pediatric): Secondary | ICD-10-CM | POA: Diagnosis present

## 2020-06-18 DIAGNOSIS — I2510 Atherosclerotic heart disease of native coronary artery without angina pectoris: Secondary | ICD-10-CM | POA: Diagnosis present

## 2020-06-18 DIAGNOSIS — E1159 Type 2 diabetes mellitus with other circulatory complications: Secondary | ICD-10-CM | POA: Diagnosis present

## 2020-06-18 DIAGNOSIS — I454 Nonspecific intraventricular block: Secondary | ICD-10-CM | POA: Diagnosis not present

## 2020-06-18 DIAGNOSIS — E785 Hyperlipidemia, unspecified: Secondary | ICD-10-CM | POA: Diagnosis present

## 2020-06-18 DIAGNOSIS — I959 Hypotension, unspecified: Secondary | ICD-10-CM | POA: Diagnosis not present

## 2020-06-18 LAB — CBC WITH DIFFERENTIAL/PLATELET
Abs Immature Granulocytes: 0.03 10*3/uL (ref 0.00–0.07)
Basophils Absolute: 0.1 10*3/uL (ref 0.0–0.1)
Basophils Relative: 1 %
Eosinophils Absolute: 0.3 10*3/uL (ref 0.0–0.5)
Eosinophils Relative: 4 %
HCT: 39.3 % (ref 36.0–46.0)
Hemoglobin: 12.7 g/dL (ref 12.0–15.0)
Immature Granulocytes: 0 %
Lymphocytes Relative: 39 %
Lymphs Abs: 3 10*3/uL (ref 0.7–4.0)
MCH: 31.1 pg (ref 26.0–34.0)
MCHC: 32.3 g/dL (ref 30.0–36.0)
MCV: 96.3 fL (ref 80.0–100.0)
Monocytes Absolute: 0.5 10*3/uL (ref 0.1–1.0)
Monocytes Relative: 7 %
Neutro Abs: 3.8 10*3/uL (ref 1.7–7.7)
Neutrophils Relative %: 49 %
Platelets: 179 10*3/uL (ref 150–400)
RBC: 4.08 MIL/uL (ref 3.87–5.11)
RDW: 17.2 % — ABNORMAL HIGH (ref 11.5–15.5)
WBC: 7.6 10*3/uL (ref 4.0–10.5)
nRBC: 0 % (ref 0.0–0.2)

## 2020-06-18 LAB — ECHOCARDIOGRAM COMPLETE
AR max vel: 2.46 cm2
AV Area VTI: 2.36 cm2
AV Area mean vel: 2.33 cm2
AV Mean grad: 5.9 mmHg
AV Peak grad: 11.8 mmHg
Ao pk vel: 1.72 m/s
Area-P 1/2: 2.48 cm2
Height: 62 in
S' Lateral: 3.85 cm
Weight: 5920 oz

## 2020-06-18 LAB — COMPREHENSIVE METABOLIC PANEL
ALT: 16 U/L (ref 0–44)
AST: 17 U/L (ref 15–41)
Albumin: 3.8 g/dL (ref 3.5–5.0)
Alkaline Phosphatase: 77 U/L (ref 38–126)
Anion gap: 8 (ref 5–15)
BUN: 9 mg/dL (ref 8–23)
CO2: 22 mmol/L (ref 22–32)
Calcium: 8.8 mg/dL — ABNORMAL LOW (ref 8.9–10.3)
Chloride: 108 mmol/L (ref 98–111)
Creatinine, Ser: 0.99 mg/dL (ref 0.44–1.00)
GFR calc Af Amer: >60 mL/min (ref >60)
GFR calc non Af Amer: >60 mL/min (ref >60)
Glucose, Bld: 94 mg/dL (ref 70–99)
Potassium: 3.5 mmol/L (ref 3.5–5.1)
Sodium: 138 mmol/L (ref 135–145)
Total Bilirubin: 0.4 mg/dL (ref 0.3–1.2)
Total Protein: 6.7 g/dL (ref 6.5–8.1)

## 2020-06-18 LAB — TROPONIN I (HIGH SENSITIVITY)
Troponin I (High Sensitivity): 3 ng/L (ref <18)
Troponin I (High Sensitivity): 3 ng/L (ref <18)
Troponin I (High Sensitivity): 2 ng/L (ref <18)

## 2020-06-18 LAB — CBG MONITORING, ED
Glucose-Capillary: 67 mg/dL — ABNORMAL LOW (ref 70–99)
Glucose-Capillary: 110 mg/dL — ABNORMAL HIGH (ref 70–99)

## 2020-06-18 LAB — HEMOGLOBIN A1C
Hgb A1c MFr Bld: 8.4 % — ABNORMAL HIGH (ref 4.8–5.6)
Mean Plasma Glucose: 194.38 mg/dL

## 2020-06-18 LAB — SARS CORONAVIRUS 2 BY RT PCR (HOSPITAL ORDER, PERFORMED IN ~~LOC~~ HOSPITAL LAB): SARS Coronavirus 2: NEGATIVE

## 2020-06-18 LAB — BRAIN NATRIURETIC PEPTIDE: B Natriuretic Peptide: 56 pg/mL (ref 0.0–100.0)

## 2020-06-18 LAB — GLUCOSE, CAPILLARY: Glucose-Capillary: 118 mg/dL — ABNORMAL HIGH (ref 70–99)

## 2020-06-18 MED ORDER — ASPIRIN EC 325 MG PO TBEC: 325.0000 mg | DELAYED_RELEASE_TABLET | Freq: Every morning | ORAL | Status: DC

## 2020-06-18 MED ORDER — SODIUM CHLORIDE 0.9 % WEIGHT BASED INFUSION
1.0000 mL/kg/h | INTRAVENOUS | Status: DC
  Administered 2020-06-19 (×2): 1 mL/kg/h via INTRAVENOUS

## 2020-06-18 MED ORDER — NITROGLYCERIN 0.4 MG SL SUBL
0.4000 mg | SUBLINGUAL_TABLET | Freq: Once | SUBLINGUAL | Status: AC
  Administered 2020-06-18: 0.4 mg via SUBLINGUAL
  Filled 2020-06-18: qty 1

## 2020-06-18 MED ORDER — HEPARIN BOLUS VIA INFUSION
4000.0000 [IU] | Freq: Once | INTRAVENOUS | Status: AC
  Administered 2020-06-18: 4000 [IU] via INTRAVENOUS

## 2020-06-18 MED ORDER — HEPARIN (PORCINE) 25000 UT/250ML-% IV SOLN
1650.0000 [IU]/h | INTRAVENOUS | Status: DC
  Administered 2020-06-18: 1300 [IU]/h via INTRAVENOUS
  Administered 2020-06-19: 1650 [IU]/h via INTRAVENOUS
  Filled 2020-06-18 (×2): qty 250

## 2020-06-18 MED ORDER — GABAPENTIN 300 MG PO CAPS
600.0000 mg | ORAL_CAPSULE | Freq: Three times a day (TID) | ORAL | Status: DC
  Administered 2020-06-18 – 2020-06-19 (×3): 600 mg via ORAL
  Filled 2020-06-18 (×3): qty 2

## 2020-06-18 MED ORDER — SODIUM CHLORIDE 0.9% FLUSH: 3.0000 mL | INTRAVENOUS | Status: DC | PRN

## 2020-06-18 MED ORDER — SODIUM CHLORIDE 0.9% FLUSH
3.0000 mL | Freq: Two times a day (BID) | INTRAVENOUS | Status: DC
  Administered 2020-06-18: 3 mL via INTRAVENOUS

## 2020-06-18 MED ORDER — ASPIRIN EC 81 MG PO TBEC
81.0000 mg | DELAYED_RELEASE_TABLET | Freq: Every morning | ORAL | Status: DC
  Administered 2020-06-19: 81 mg via ORAL
  Filled 2020-06-18: qty 1

## 2020-06-18 MED ORDER — NITROGLYCERIN 2 % TD OINT
0.5000 [in_us] | TOPICAL_OINTMENT | Freq: Four times a day (QID) | TRANSDERMAL | Status: DC
  Administered 2020-06-18 – 2020-06-19 (×3): 0.5 [in_us] via TOPICAL
  Filled 2020-06-18: qty 1
  Filled 2020-06-18: qty 30

## 2020-06-18 MED ORDER — PANTOPRAZOLE SODIUM 40 MG PO TBEC
40.0000 mg | DELAYED_RELEASE_TABLET | Freq: Once | ORAL | Status: AC
  Administered 2020-06-18: 40 mg via ORAL
  Filled 2020-06-18: qty 1

## 2020-06-18 MED ORDER — IRBESARTAN 150 MG PO TABS
150.0000 mg | ORAL_TABLET | Freq: Every day | ORAL | Status: DC
  Administered 2020-06-18 – 2020-06-19 (×2): 150 mg via ORAL
  Filled 2020-06-18 (×4): qty 1

## 2020-06-18 MED ORDER — ROSUVASTATIN CALCIUM 20 MG PO TABS
20.0000 mg | ORAL_TABLET | Freq: Every day | ORAL | Status: DC
  Administered 2020-06-18: 20 mg via ORAL
  Filled 2020-06-18 (×3): qty 1

## 2020-06-18 MED ORDER — INSULIN ASPART 100 UNIT/ML ~~LOC~~ SOLN: 0.0000 [IU] | Freq: Three times a day (TID) | SUBCUTANEOUS | Status: DC

## 2020-06-18 MED ORDER — ONDANSETRON HCL 4 MG PO TABS: 4.0000 mg | ORAL_TABLET | Freq: Four times a day (QID) | ORAL | Status: DC | PRN

## 2020-06-18 MED ORDER — MORPHINE SULFATE (PF) 2 MG/ML IV SOLN
2.0000 mg | Freq: Once | INTRAVENOUS | Status: AC
  Administered 2020-06-18: 2 mg via INTRAVENOUS
  Filled 2020-06-18: qty 1

## 2020-06-18 MED ORDER — NICOTINE 7 MG/24HR TD PT24
7.0000 mg | MEDICATED_PATCH | Freq: Every day | TRANSDERMAL | Status: DC
  Administered 2020-06-18: 7 mg via TRANSDERMAL
  Filled 2020-06-18 (×3): qty 1

## 2020-06-18 MED ORDER — ASPIRIN 81 MG PO CHEW
81.0000 mg | CHEWABLE_TABLET | ORAL | Status: AC
  Administered 2020-06-19: 81 mg via ORAL
  Filled 2020-06-18: qty 1

## 2020-06-18 MED ORDER — ACETAMINOPHEN 650 MG RE SUPP: 650.0000 mg | Freq: Four times a day (QID) | RECTAL | Status: DC | PRN

## 2020-06-18 MED ORDER — SODIUM CHLORIDE 0.9 % IV SOLN: 250.0000 mL | INTRAVENOUS | Status: DC | PRN

## 2020-06-18 MED ORDER — INSULIN ASPART 100 UNIT/ML ~~LOC~~ SOLN: 0.0000 [IU] | Freq: Every day | SUBCUTANEOUS | Status: DC

## 2020-06-18 MED ORDER — METOPROLOL TARTRATE 25 MG PO TABS
25.0000 mg | ORAL_TABLET | Freq: Two times a day (BID) | ORAL | Status: DC
  Administered 2020-06-18 – 2020-06-19 (×2): 25 mg via ORAL
  Filled 2020-06-18 (×2): qty 1

## 2020-06-18 MED ORDER — ALBUTEROL SULFATE (2.5 MG/3ML) 0.083% IN NEBU: 3.0000 mL | INHALATION_SOLUTION | Freq: Four times a day (QID) | RESPIRATORY_TRACT | Status: DC | PRN

## 2020-06-18 MED ORDER — SODIUM CHLORIDE 0.9 % WEIGHT BASED INFUSION
3.0000 mL/kg/h | INTRAVENOUS | Status: DC
  Administered 2020-06-19: 3 mL/kg/h via INTRAVENOUS

## 2020-06-18 MED ORDER — SODIUM CHLORIDE 0.9% FLUSH: 3.0000 mL | Freq: Two times a day (BID) | INTRAVENOUS | Status: DC

## 2020-06-18 MED ORDER — ONDANSETRON HCL 4 MG/2ML IJ SOLN
4.0000 mg | Freq: Once | INTRAMUSCULAR | Status: AC
  Administered 2020-06-18: 4 mg via INTRAVENOUS
  Filled 2020-06-18: qty 2

## 2020-06-18 MED ORDER — PANTOPRAZOLE SODIUM 40 MG PO TBEC
40.0000 mg | DELAYED_RELEASE_TABLET | Freq: Every day | ORAL | Status: DC
  Administered 2020-06-18 – 2020-06-19 (×2): 40 mg via ORAL
  Filled 2020-06-18 (×2): qty 1

## 2020-06-18 MED ORDER — ONDANSETRON HCL 4 MG/2ML IJ SOLN: 4.0000 mg | Freq: Four times a day (QID) | INTRAMUSCULAR | Status: DC | PRN

## 2020-06-18 MED ORDER — ACETAMINOPHEN 325 MG PO TABS
650.0000 mg | ORAL_TABLET | Freq: Four times a day (QID) | ORAL | Status: DC | PRN
  Administered 2020-06-18 – 2020-06-19 (×2): 650 mg via ORAL
  Filled 2020-06-18 (×2): qty 2

## 2020-06-18 MED ORDER — NITROGLYCERIN 0.4 MG SL SUBL
0.4000 mg | SUBLINGUAL_TABLET | SUBLINGUAL | Status: DC | PRN
  Administered 2020-06-18 (×2): 0.4 mg via SUBLINGUAL
  Filled 2020-06-18 (×3): qty 1

## 2020-06-18 NOTE — ED Notes (Signed)
Pt given orange juice and dinner tray for glucose of 67.

## 2020-06-18 NOTE — ED Notes (Signed)
Pt's chest pain remained at 7 after 2nd nitro.  3rd nitro given.

## 2020-06-18 NOTE — ED Provider Notes (Addendum)
[facility] EMERGENCY DEPARTMENT Provider Note   CSN: [id number] Arrival date & time: 06/18/20  1159     History Chief Complaint  Patient presents with  . Chest Pain    [name] is a 59 y.o. female presenting for evaluation of chest pain.  Patient states around 730 this morning she developed acute onset left arm pain that radiated to her chest.  Her pain migrated until it settled in her chest.  She describes it as a heaviness with intermittent sharp pain.  She is continues to have discomfort in her shoulder.  She reports associated diaphoresis and nausea, no vomiting.  She continues to smoke a half a pack of cigarettes daily.  She reports a history of diabetes and hypertension.  She does not follow with cardiology regularly, but states that she did have a procedure of some sort in 2008 for her heart.  She is not on blood thinners.  She denies recent travel, immobilization, history of cancer, history of previous DVT/PE, or hormone use.  She denies recent fevers, chills, cough, abdominal pain, urinary symptoms, normal bowel movements.  She is on Lasix, reports leg swelling slightly worse than baseline.  Additional history, chart reviewed.  Patient with a history of CAD with BMS to circumflex in 2008, CKD, hypertension, GERD, hyperlipidemia, obesity, OSA, diabetes  HPI     Past Medical History:  Diagnosis Date  . Arthritis   . CAD (coronary artery disease)    BMS to circumflex 2007 - Dr. Olevia Perches  . CKD (chronic kidney disease) stage 3, GFR 30-59 ml/min   . Essential hypertension   . Falls frequently   . GERD (gastroesophageal reflux disease)   . HA (headache)   . Hyperlipidemia   . Left-sided face pain   . Morbid obesity ([place])   . Noncompliance   . OSA (obstructive sleep apnea)    Uses CPAP  . Type 2 diabetes mellitus ([place])   . Urinary incontinence     Patient Active Problem List   Diagnosis Date Noted  . Moderate nonproliferative diabetic retinopathy of both eyes  without macular edema associated with type 2 diabetes mellitus ([place]) 01/05/2019  . Hypertension associated with diabetes ([place]) 09/11/2015  . Spinal stenosis, lumbar region, with neurogenic claudication 04/25/2015  . Arthritis associated with diabetes ([place]) 08/28/2014  . Peripheral edema 08/28/2014  . Diabetic neuropathy ([place]) 02/18/2014  . External bleeding hemorrhoids 04/10/2013  . Morbid (severe) obesity due to excess calories ([place]) 04/09/2013  . Diabetes ([place]) 04/09/2013  . Thrombocytopenia, unspecified ([place]) 04/09/2013  . Chest pain 04/09/2013  . OSA (obstructive sleep apnea) 07/06/2011  . DOE (dyspnea on exertion) 06/06/2011  . Coronary artery disease 02/03/2011  . GERD (gastroesophageal reflux disease) 02/03/2011  . Hyperlipidemia associated with type 2 diabetes mellitus ([place]) 02/03/2011  . Noncompliance 02/03/2011  . Generalized anxiety disorder 02/03/2011  . Insomnia 02/03/2011  . Cigarette smoker 02/03/2011    Past Surgical History:  Procedure Laterality Date  . ABDOMINAL HERNIA REPAIR    . ABDOMINAL HYSTERECTOMY    . BACK SURGERY    . BREAST SURGERY     biopsy per right breast; pt states has silver piece in for marking   . Cataract surgery Bilateral   . CHOLECYSTECTOMY    . COLONOSCOPY N/A 04/10/2013   Procedure: COLONOSCOPY;  Surgeon: Rogene Houston, MD;  Location: [facility] ENDO SUITE;  Service: Endoscopy;  Laterality: N/A;  . CORONARY ANGIOPLASTY WITH STENT PLACEMENT  2012  . LUMBAR LAMINECTOMY/DECOMPRESSION MICRODISCECTOMY N/A 04/25/2015  Procedure: CENTRAL LUMBAR /DECOMPRESSION L4-L5;  Surgeon: Latanya Maudlin, MD;  Location: [facility] ORS;  Service: Orthopedics;  Laterality: N/A;  . TUBAL LIGATION       OB History   No obstetric history on file.     Family History  Problem Relation Age of Onset  . Coronary artery disease Father   . Cancer - Colon Father   . Diabetes Father   . High Cholesterol Father   . Breast cancer Mother   . Arthritis Sister   . Asthma Sister    . High Cholesterol Daughter   . Thyroid disease Daughter   . Hiatal hernia Son   . Congenital heart disease Son     Social History   Tobacco Use  . Smoking status: Current Every Day Smoker    Packs/day: 1.00    Years: 47.00    Pack years: 47.00    Types: Cigarettes    Start date: 05/14/1972  . Smokeless tobacco: Never Used  Vaping Use  . Vaping Use: Never used  Substance Use Topics  . Alcohol use: No    Alcohol/week: 0.0 standard drinks  . Drug use: No    Home Medications Prior to Admission medications   Medication Sig Start Date End Date Taking? Authorizing Provider  acetaminophen (TYLENOL) 650 MG CR tablet Take 650 mg by mouth every 8 (eight) hours as needed for pain.    [provider]  albuterol (VENTOLIN HFA) 108 (90 Base) MCG/ACT inhaler Inhale 2 puffs into the lungs every 6 (six) hours as needed for wheezing or shortness of breath (for rescue). 03/14/19   Janora Norlander, DO  aspirin EC 325 MG tablet Take 325 mg by mouth every morning.     [provider]  Blood Glucose Monitoring Suppl (ONETOUCH VERIO) w/Device KIT Use to test blood sugar twice daily as directed; DX: E11.69 03/26/20   Janora Norlander, DO  colestipol (COLESTID) 1 g tablet Take 2 tablets (2 g total) by mouth 2 (two) times daily. 01/05/19   Janora Norlander, DO  diclofenac (VOLTAREN) 75 MG EC tablet Take 1 tablet (75 mg total) by mouth 2 (two) times daily. 08/31/19   Evelina Dun A, FNP  diclofenac sodium (VOLTAREN) 1 % GEL Apply 4 g topically 4 (four) times daily. 05/16/19   Janora Norlander, DO  Dulaglutide (TRULICITY) 0.35 WS/5.6CL SOPN Inject 0.75 mg into the skin once a week. 03/27/20   Janora Norlander, DO  furosemide (LASIX) 40 MG tablet TAKE 1 TABLET BY MOUTH ONCE DAILY AS NEEDED FOR EDEMA OR  FLUID 01/28/20   Ronnie Doss M, DO  gabapentin (NEURONTIN) 300 MG capsule TAKE 2 CAPSULES BY MOUTH IN THE MORNING AND 2 AT NOON AND 2 IN THE EVENING .(Needs to be seen before  next refill) 06/10/20   Janora Norlander, DO  glimepiride (AMARYL) 2 MG tablet Take 1 tablet (2 mg total) by mouth daily with breakfast. (Needs to be seen before next refill) 05/26/20   Janora Norlander, DO  glucose blood (ONETOUCH VERIO) test strip Use to test blood sugar twice daily as directed; DX: E11.69 03/26/20   Ronnie Doss M, DO  metFORMIN (GLUCOPHAGE XR) 500 MG 24 hr tablet Take 2 tablets (1,000 mg total) by mouth 2 (two) times daily with a meal. 03/27/20   Ronnie Doss M, DO  metoprolol tartrate (LOPRESSOR) 25 MG tablet Take 1 tablet by mouth twice daily 03/31/20   Ronnie Doss M, DO  nitroGLYCERIN (NITROSTAT) 0.4 MG  SL tablet See AUX Label 02/20/18   Eustaquio Maize, MD  omeprazole (PRILOSEC) 40 MG capsule TAKE 1 CAPSULE BY MOUTH ONCE DAILY 30 MINUTES BEFORE A MEAL (Needs to be seen before next refill) 05/26/20   Janora Norlander, DO  OneTouch Delica Lancets 35T MISC Use to test blood sugar twice daily as directed; DX: E11.69 03/26/20   Ronnie Doss M, DO  OneTouch Delica Lancets 01X MISC Check blood sugar BID and prn  E11.9 03/26/20   Ronnie Doss M, DO  rosuvastatin (CRESTOR) 20 MG tablet Take 1 tablet (20 mg total) by mouth at bedtime. (Needs to be seen before next refill) 05/26/20   Janora Norlander, DO  telmisartan (MICARDIS) 40 MG tablet Take 1 tablet (40 mg total) by mouth daily. 06/09/20   Tanda Rockers, MD  omeprazole (PRILOSEC) 40 MG capsule TAKE 1 CAPSULE BY MOUTH ONCE DAILY 30  MINUTES  BEFORE  A  MEAL 03/13/19   Ronnie Doss M, DO    Allergies    Morphine and related  Review of Systems   Review of Systems  Respiratory: Positive for shortness of breath.   Cardiovascular: Positive for chest pain and leg swelling.  Gastrointestinal: Positive for nausea.  All other systems reviewed and are negative.   Physical Exam Updated Vital Signs BP 120/66 (BP Location: Right Wrist)   Pulse 76   Temp 97.7 F (36.5 C) (Oral)   Resp 15   Ht '5\' 2"'   (1.575 m)   Wt (!) 167.8 kg   SpO2 95%   BMI 67.67 kg/m   Physical Exam Vitals and nursing note reviewed.  Constitutional:      General: She is not in acute distress.    Appearance: She is well-developed.     Comments: Appears anxious and uncomfortable, but nontoxic  HENT:     Head: Normocephalic and atraumatic.  Eyes:     Extraocular Movements: Extraocular movements intact.     Conjunctiva/sclera: Conjunctivae normal.     Pupils: Pupils are equal, round, and reactive to light.  Cardiovascular:     Rate and Rhythm: Normal rate and regular rhythm.     Pulses: Normal pulses.  Pulmonary:     Effort: No respiratory distress.     Breath sounds: Normal breath sounds. No wheezing.  Abdominal:     General: There is no distension.     Palpations: Abdomen is soft. There is no mass.     Tenderness: There is no abdominal tenderness. There is no guarding or rebound.  Musculoskeletal:        General: Normal range of motion.     Cervical back: Normal range of motion and neck supple.     Right lower leg: Edema present.     Left lower leg: Edema present.     Comments: Bilateral lower leg swelling, slightly worse than baseline per patient.  Equal bilaterally.  Pedal pulses 2+ bilaterally.  Skin:    General: Skin is warm and dry.  Neurological:     Mental Status: She is alert and oriented to person, place, and time.     ED Results / Procedures / Treatments   Labs (all labs ordered are listed, but only abnormal results are displayed) Labs Reviewed  CBC WITH DIFFERENTIAL/PLATELET - Abnormal; Notable for the following components:      Result Value   RDW 17.2 (*)    All other components within normal limits  COMPREHENSIVE METABOLIC PANEL - Abnormal; Notable for the following components:  Calcium 8.8 (*)    All other components within normal limits  SARS CORONAVIRUS 2 BY RT PCR (HOSPITAL ORDER, [place] [facility])  TROPONIN I (HIGH SENSITIVITY)  TROPONIN I (HIGH  SENSITIVITY)    EKG None  Radiology DG Chest 2 View  Result Date: 06/18/2020 CLINICAL DATA:  Chest pain EXAM: CHEST - 2 VIEW COMPARISON:  December 28, 2018 FINDINGS: The cardiomediastinal silhouette is unchanged in contour.Atherosclerotic calcifications of the aorta. No pleural effusion. No pneumothorax. No acute pleuroparenchymal abnormality. Visualized abdomen is unremarkable. Multilevel degenerative changes of the thoracic spine. IMPRESSION: No acute cardiopulmonary abnormality. Electronically Signed   By: Valentino Saxon MD   On: 06/18/2020 13:19    Procedures Procedures (including critical care time)  Medications Ordered in ED Medications  pantoprazole (PROTONIX) EC tablet 40 mg (has no administration in time range)  nitroGLYCERIN (NITROSTAT) SL tablet 0.4 mg (has no administration in time range)  nitroGLYCERIN (NITROSTAT) SL tablet 0.4 mg (0.4 mg Sublingual Given 06/18/20 1259)    ED Course  I have reviewed the triage vital signs and the nursing notes.  Pertinent labs & imaging results that were available during my care of the patient were reviewed by me and considered in my medical decision making (see chart for details).    MDM Rules/Calculators/A&P                          Patient resenting for evaluation of chest pain.  On exam, patient appears anxious and uncomfortable, but otherwise nontoxic.  History is concerning, as is her risk factors.  She does not follow with cardiology regularly, I am not sure that she will be able to get close follow-up.  I am concerned for ACS.  EKG without STEMI, but does show some changes since 2019.  Will obtain labs including troponin, chest x-ray.  Will give nitro for pain.  Patient received aspirin with EMS.  On reassessment, patient reports continued improvement of pain with nitro, although she is not pain-free at this time.  Will give another dose.  Chest x-ray viewed interpreted by me, no pneumonia, pnx, effusion.  Initial labs  reassuring, troponin negative at 3.  Electrolytes stable.  Hemoglobin stable.  Delta Lance Bosch is pending.  Based on patient's concerning history and presentation, she would likely benefit from hospitalization for further cardiac work-up.  Will call for admission.  Discussed with Dr. Manuella Ghazi from triad hospitalist service, patient to be admitted.  Requesting cardiology input. Consult placed.   Discussed with Dr. Harl Bowie from cardiology who recommends heparin drip.  They will evaluate the patient this afternoon, consider need for nitro drip.  Will place further recommendations on consult.  Final Clinical Impression(s) / ED Diagnoses Final diagnoses:  Chest pain, unspecified type    Rx / DC Orders ED Discharge Orders    None       Franchot Heidelberg, PA-C 06/18/20 1403    Franchot Heidelberg, PA-C 06/18/20 1440    Veryl Speak, MD 06/18/20 1501

## 2020-06-18 NOTE — Progress Notes (Signed)
ANTICOAGULATION CONSULT NOTE - Initial Consult  Pharmacy Consult for heparin Indication: chest pain/ACS  Allergies  Allergen Reactions  . Morphine And Related Nausea And Vomiting    Patient Measurements: Height: 5\' 2"  (157.5 cm) Weight: (!) 167.8 kg (370 lb) IBW/kg (Calculated) : 50.1 Heparin Dosing Weight: 94 kg  Vital Signs: Temp: 97.7 F (36.5 C) (08/11 1236) Temp Source: Oral (08/11 1236) BP: 130/71 (08/11 1440) Pulse Rate: 74 (08/11 1440)  Labs: Recent Labs    06/18/20 1227  HGB 12.7  HCT 39.3  PLT 179  CREATININE 0.99  TROPONINIHS 3    Estimated Creatinine Clearance: 91.6 mL/min (by C-G formula based on SCr of 0.99 mg/dL).   Medical History: Past Medical History:  Diagnosis Date  . Arthritis   . CAD (coronary artery disease)    BMS to circumflex 2007 - Dr. 2008  . CKD (chronic kidney disease) stage 3, GFR 30-59 ml/min   . Essential hypertension   . Falls frequently   . GERD (gastroesophageal reflux disease)   . HA (headache)   . Hyperlipidemia   . Left-sided face pain   . Morbid obesity ([facility])   . Noncompliance   . OSA (obstructive sleep apnea)    Uses CPAP  . Type 2 diabetes mellitus ([facility])   . Urinary incontinence     Medications:  (Not in a hospital admission)   Assessment: Pharmacy consulted to dose heparin in patient with chest pain/ACS.  Patient is not on anticoagulation prior to admission.  Goal of Therapy:  Heparin level 0.3-0.7 units/ml Monitor platelets by anticoagulation protocol: Yes   Plan:  Give 4000 units bolus x 1 Start heparin infusion at 1300 units/hr Check anti-Xa level in 6 hours and daily while on heparin Continue to monitor H&H and platelets  Juanda Chance, PharmD Clinical Pharmacist 06/18/2020 2:53 PM

## 2020-06-18 NOTE — ED Triage Notes (Signed)
Pt presents to ED with complaints of chest pain, left arm pain and shoulder pain. NSR on monitor for EMS. Pt taken 1 Nitro and ASA 325 mg PTA

## 2020-06-18 NOTE — Progress Notes (Signed)
*  PRELIMINARY RESULTS* Echocardiogram 2D Echocardiogram has been performed.  [name] 06/18/2020, 3:54 PM

## 2020-06-18 NOTE — H&P (Addendum)
History and Physical    [name] NKN:[id number] DOB: 11-Nov-1960 DOA: 06/18/2020  PCP: Janora Norlander, DO   Patient coming from: Home  Chief Complaint: Chest pain  HPI: [name] is a 59 y.o. female with medical history significant for morbid obesity, ongoing tobacco abuse, hypertension, dyslipidemia, OSA on CPAP, type 2 diabetes, and CAD with prior BMS to circumflex in 2007 who presented to the ED with complaints of left arm and chest pain that began at approximately 730 this morning while she was in her dining room.  Apparently she is noted a pressure-like sensation and discomfort in her left arm first and this migrated up to her shoulder and then to her chest in the substernal region.  This was accompanied with dyspnea as well as diaphoresis and nausea, but no emesis.  She stated that she ran out of her nitroglycerin and therefore did not try this at home and she denied any exacerbating factors.  She denies any fevers, chills, cough, or sick contacts.  She states that the edema in her legs is fairly chronic.  Her weight stays stable at around 307 pounds at home.  She continues to smoke half a pack of cigarettes daily.   ED Course: Stable vital signs noted and no other acute lab abnormalities noted.  Troponins have been 3x2 sets.  Covid testing is negative.  Chest x-ray with no acute process.  EKG with right bundle branch block and anterolateral T wave inversions and ST depression noted.  Otherwise sinus rhythm.  Patient has received nitroglycerin with improvement in her pain complaints noted.  Cardiology contacted with recommendations to start on heparin drip.  Review of Systems: All others reviewed and otherwise negative except as noted above.  Past Medical History:  Diagnosis Date  . Arthritis   . CAD (coronary artery disease)    BMS to circumflex 2007 - Dr. Olevia Perches  . CKD (chronic kidney disease) stage 3, GFR 30-59 ml/min   . Essential hypertension   . Falls frequently   .  GERD (gastroesophageal reflux disease)   . HA (headache)   . Hyperlipidemia   . Left-sided face pain   . Morbid obesity ([place])   . Noncompliance   . OSA (obstructive sleep apnea)    Uses CPAP  . Type 2 diabetes mellitus ([place])   . Urinary incontinence     Past Surgical History:  Procedure Laterality Date  . ABDOMINAL HERNIA REPAIR    . ABDOMINAL HYSTERECTOMY    . BACK SURGERY    . BREAST SURGERY     biopsy per right breast; pt states has silver piece in for marking   . Cataract surgery Bilateral   . CHOLECYSTECTOMY    . COLONOSCOPY N/A 04/10/2013   Procedure: COLONOSCOPY;  Surgeon: Rogene Houston, MD;  [facility];  Service: Endoscopy;  Laterality: N/A;  . CORONARY ANGIOPLASTY WITH STENT PLACEMENT  2012  . LUMBAR LAMINECTOMY/DECOMPRESSION MICRODISCECTOMY N/A 04/25/2015   Procedure: CENTRAL LUMBAR /DECOMPRESSION L4-L5;  Surgeon: Latanya Maudlin, MD;  Location: [facility];  Service: Orthopedics;  Laterality: N/A;  . TUBAL LIGATION       reports that she has been smoking cigarettes. She started smoking about 48 years ago. She has a 47.00 pack-year smoking history. She has never used smokeless tobacco. She reports that she does not drink alcohol and does not use drugs.  Allergies  Allergen Reactions  . Morphine And Related Nausea And Vomiting    Family History  Problem Relation  Age of Onset  . Coronary artery disease Father   . Cancer - Colon Father   . Diabetes Father   . High Cholesterol Father   . Breast cancer Mother   . Arthritis Sister   . Asthma Sister   . High Cholesterol Daughter   . Thyroid disease Daughter   . Hiatal hernia Son   . Congenital heart disease Son     Prior to Admission medications   Medication Sig Start Date End Date Taking? Authorizing Provider  acetaminophen (TYLENOL) 650 MG CR tablet Take 650 mg by mouth every 8 (eight) hours as needed for pain.    [provider]  albuterol (VENTOLIN HFA) 108 (90 Base) MCG/ACT inhaler Inhale 2  puffs into the lungs every 6 (six) hours as needed for wheezing or shortness of breath (for rescue). 03/14/19   Janora Norlander, DO  aspirin EC 325 MG tablet Take 325 mg by mouth every morning.     [provider]  Blood Glucose Monitoring Suppl (ONETOUCH VERIO) w/Device KIT Use to test blood sugar twice daily as directed; DX: E11.69 03/26/20   Janora Norlander, DO  colestipol (COLESTID) 1 g tablet Take 2 tablets (2 g total) by mouth 2 (two) times daily. 01/05/19   Janora Norlander, DO  diclofenac (VOLTAREN) 75 MG EC tablet Take 1 tablet (75 mg total) by mouth 2 (two) times daily. 08/31/19   Evelina Dun A, FNP  diclofenac sodium (VOLTAREN) 1 % GEL Apply 4 g topically 4 (four) times daily. 05/16/19   Janora Norlander, DO  Dulaglutide (TRULICITY) 8.41 LK/4.4WN SOPN Inject 0.75 mg into the skin once a week. 03/27/20   Janora Norlander, DO  furosemide (LASIX) 40 MG tablet TAKE 1 TABLET BY MOUTH ONCE DAILY AS NEEDED FOR EDEMA OR  FLUID 01/28/20   Ronnie Doss M, DO  gabapentin (NEURONTIN) 300 MG capsule TAKE 2 CAPSULES BY MOUTH IN THE MORNING AND 2 AT NOON AND 2 IN THE EVENING .(Needs to be seen before next refill) 06/10/20   Janora Norlander, DO  glimepiride (AMARYL) 2 MG tablet Take 1 tablet (2 mg total) by mouth daily with breakfast. (Needs to be seen before next refill) 05/26/20   Janora Norlander, DO  glucose blood (ONETOUCH VERIO) test strip Use to test blood sugar twice daily as directed; DX: E11.69 03/26/20   Ronnie Doss M, DO  metFORMIN (GLUCOPHAGE XR) 500 MG 24 hr tablet Take 2 tablets (1,000 mg total) by mouth 2 (two) times daily with a meal. 03/27/20   Ronnie Doss M, DO  metoprolol tartrate (LOPRESSOR) 25 MG tablet Take 1 tablet by mouth twice daily 03/31/20   Ronnie Doss M, DO  nitroGLYCERIN (NITROSTAT) 0.4 MG SL tablet See AUX Label 02/20/18   Eustaquio Maize, MD  omeprazole (PRILOSEC) 40 MG capsule TAKE 1 CAPSULE BY MOUTH ONCE DAILY 30 MINUTES BEFORE  A MEAL (Needs to be seen before next refill) 05/26/20   Janora Norlander, DO  OneTouch Delica Lancets 02V MISC Use to test blood sugar twice daily as directed; DX: E11.69 03/26/20   Ronnie Doss M, DO  OneTouch Delica Lancets 25D MISC Check blood sugar BID and prn  E11.9 03/26/20   Ronnie Doss M, DO  rosuvastatin (CRESTOR) 20 MG tablet Take 1 tablet (20 mg total) by mouth at bedtime. (Needs to be seen before next refill) 05/26/20   Janora Norlander, DO  telmisartan (MICARDIS) 40 MG tablet Take 1 tablet (40 mg  total) by mouth daily. 06/09/20   Tanda Rockers, MD  omeprazole (PRILOSEC) 40 MG capsule TAKE 1 CAPSULE BY MOUTH ONCE DAILY 30  MINUTES  BEFORE  A  MEAL 03/13/19   Ronnie Doss [place], DO    Physical Exam: Vitals:   06/18/20 1236 06/18/20 1330 06/18/20 1413 06/18/20 1440  BP: 128/65 120/66 (!) 144/75 130/71  Pulse: 77 76 72 74  Resp: (!) 22 15    Temp: 97.7 F (36.5 C)     TempSrc: Oral     SpO2: 97% 95%    Weight:      Height:        Constitutional: NAD, calm, comfortable Vitals:   06/18/20 1236 06/18/20 1330 06/18/20 1413 06/18/20 1440  BP: 128/65 120/66 (!) 144/75 130/71  Pulse: 77 76 72 74  Resp: (!) 22 15    Temp: 97.7 F (36.5 C)     TempSrc: Oral     SpO2: 97% 95%    Weight:      Height:       Eyes: lids and conjunctivae normal ENMT: Mucous membranes are moist.  Neck: normal, supple Respiratory: clear to auscultation bilaterally. Normal respiratory effort. No accessory muscle use.  Cardiovascular: Regular rate and rhythm, no murmurs. No extremity edema. Abdomen: no tenderness, no distention. Bowel sounds positive.  Musculoskeletal:  No joint deformity upper and lower extremities.   Skin: no rashes, lesions, ulcers.  Psychiatric: Normal judgment and insight. Alert and oriented x 3. Normal mood.   Labs on Admission: I have personally reviewed following labs and imaging studies  CBC: Recent Labs  Lab 06/18/20 1227  WBC 7.6  NEUTROABS 3.8  HGB  12.7  HCT 39.3  MCV 96.3  PLT 500   Basic Metabolic Panel: Recent Labs  Lab 06/18/20 1227  NA 138  K 3.5  CL 108  CO2 22  GLUCOSE 94  BUN 9  CREATININE 0.99  CALCIUM 8.8*   GFR: Estimated Creatinine Clearance: 91.6 mL/min (by C-G formula based on SCr of 0.99 mg/dL). Liver Function Tests: Recent Labs  Lab 06/18/20 1227  AST 17  ALT 16  ALKPHOS 77  BILITOT 0.4  PROT 6.7  ALBUMIN 3.8   No results for input(s): LIPASE, AMYLASE in the last 168 hours. No results for input(s): AMMONIA in the last 168 hours. Coagulation Profile: No results for input(s): INR, PROTIME in the last 168 hours. Cardiac Enzymes: No results for input(s): CKTOTAL, CKMB, CKMBINDEX, TROPONINI in the last 168 hours. BNP (last 3 results) No results for input(s): PROBNP in the last 8760 hours. HbA1C: No results for input(s): HGBA1C in the last 72 hours. CBG: No results for input(s): GLUCAP in the last 168 hours. Lipid Profile: No results for input(s): CHOL, HDL, LDLCALC, TRIG, CHOLHDL, LDLDIRECT in the last 72 hours. Thyroid Function Tests: No results for input(s): TSH, T4TOTAL, FREET4, T3FREE, THYROIDAB in the last 72 hours. Anemia Panel: No results for input(s): VITAMINB12, FOLATE, FERRITIN, TIBC, IRON, RETICCTPCT in the last 72 hours. Urine analysis:    Component Value Date/Time   COLORURINE YELLOW 04/22/2015 1103   APPEARANCEUR Hazy (A) 05/18/2019 0938   LABSPEC 1.018 04/22/2015 1103   PHURINE 6.0 04/22/2015 1103   GLUCOSEU 3+ (A) 05/18/2019 0938   HGBUR NEGATIVE 04/22/2015 1103   BILIRUBINUR Negative 05/18/2019 [place] 04/22/2015 1103   PROTEINUR Negative 05/18/2019 0938   PROTEINUR NEGATIVE 04/22/2015 1103   UROBILINOGEN 1.0 04/22/2015 1103   NITRITE Negative 05/18/2019 0938   NITRITE  NEGATIVE 04/22/2015 1103   LEUKOCYTESUR Trace (A) 05/18/2019 0938    Radiological Exams on Admission: DG Chest 2 View  Result Date: 06/18/2020 CLINICAL DATA:  Chest pain EXAM:  CHEST - 2 VIEW COMPARISON:  December 28, 2018 FINDINGS: The cardiomediastinal silhouette is unchanged in contour.Atherosclerotic calcifications of the aorta. No pleural effusion. No pneumothorax. No acute pleuroparenchymal abnormality. Visualized abdomen is unremarkable. Multilevel degenerative changes of the thoracic spine. IMPRESSION: No acute cardiopulmonary abnormality. Electronically Signed   By: Valentino Saxon MD   On: 06/18/2020 13:19    EKG: Independently reviewed. NSR 77bpm. St changes and T wave inversions on anterior leads.  Assessment/Plan Active Problems:   Chest pain    Chest pain with concern for unstable angina -Plan for cardiac catheterization once bed available at [facility].  Will transfer to cardiology service at that time. -Troponin with negative trend, continue to trend every few hours -EKG with new anterolateral T wave inversions and ST depressions -Continue on medical therapy with aspirin, statin, heparin drip, Lopressor, ARB -Nitropatch and morphine to assist with chest pain -We will use nitroglycerin drip if needed for pain control  History of CAD with prior bare-metal stent to left circumflex in 2007 -Continue on medications as noted above  GERD -PPI  OSA -On home CPAP which will be continued while here  Ongoing tobacco abuse -Smokes 1/2 pack/day -Counseled on cessation -Nicotine patch  Type 2 diabetes -Appears to be symptomatic with Trulicity and home Metformin -Also uses glipizide -Maintain on SSI and carb modified diet with n.p.o. after midnight in anticipation for cardiac catheterization  Morbid obesity -Lifestyle changes   DVT prophylaxis: Heparin drip Code Status: Full Family Communication: Daughter-in-law at bedside Disposition Plan:Admit for chest pain evaluation Consults called:Cardiology Admission status: Observation, Stepdown   [name] D Manuella Ghazi DO [facility]  If 7PM-7AM, please contact  night-coverage [email or web address]  06/18/2020, 3:43 PM

## 2020-06-18 NOTE — Progress Notes (Signed)
Patient refused CPAP for the night  

## 2020-06-18 NOTE — Consult Note (Signed)
Cardiology Consultation:   Patient ID: [name] MRN: [id number]; DOB: April 30, 1961  Admit date: 06/18/2020 Date of Consult: 06/18/2020  Primary Care Provider: [name], DO [facility] Cardiologist: Stacey [name] [facility] Electrophysiologist:  None   Patient Profile:   [name] is a 59 y.o. female with a hx of CAD who is being seen today for the evaluation of chest pain at the request of Stacey [name].  History of Present Illness:   [name] 59 yo female history of CAD with BMS to LCX in 2007, HL, OSA, DM2, chronic lymphedema,  presents with chest pain.  Reports sudden onset of chest pain while sitting in the kitchen around 10AM. Started left forearm and radiated up arm into left shoulder and chest. +diaphoertic. +SOB. Not positional. Mild improvement with NG. Pain has been constant since onset at 10AM but varying in severity. Not positional.    WBC 7.6 Hgb 12.7 Plt 179 K 3.5 Cr 0.99 BUN 9  hstrop 3-->3--> COVID neg CXR no acute process EKG SR, RBBB, anterolateral TWIs and ST depression  Past Medical History:  Diagnosis Date  . Arthritis   . CAD (coronary artery disease)    BMS to circumflex 2007 - Stacey. Juanda [name]  . CKD (chronic kidney disease) stage 3, GFR 30-59 ml/min   . Essential hypertension   . Falls frequently   . GERD (gastroesophageal reflux disease)   . HA (headache)   . Hyperlipidemia   . Left-sided face pain   . Morbid obesity ([facility])   . Noncompliance   . OSA (obstructive sleep apnea)    Uses CPAP  . Type 2 diabetes mellitus ([facility])   . Urinary incontinence     Past Surgical History:  Procedure Laterality Date  . ABDOMINAL HERNIA REPAIR    . ABDOMINAL HYSTERECTOMY    . BACK SURGERY    . BREAST SURGERY     biopsy per right breast; pt states has silver piece in for marking   . Cataract surgery Bilateral   . CHOLECYSTECTOMY    . COLONOSCOPY N/A 04/10/2013   Procedure: COLONOSCOPY;  Surgeon: Stacey Hippo, MD;  [facility];   Service: Endoscopy;  Laterality: N/A;  . CORONARY ANGIOPLASTY WITH STENT PLACEMENT  2012  . LUMBAR LAMINECTOMY/DECOMPRESSION MICRODISCECTOMY N/A 04/25/2015   Procedure: CENTRAL LUMBAR /DECOMPRESSION L4-L5;  Surgeon: Ranee Gosselin, MD;  Location: [facility];  Service: Orthopedics;  Laterality: N/A;  . TUBAL LIGATION        Inpatient Medications: Scheduled Meds: . heparin  4,000 Units Intravenous Once  . nitroGLYCERIN  0.5 inch Topical Q6H   Continuous Infusions: . heparin     PRN Meds: nitroGLYCERIN  Allergies:    Allergies  Allergen Reactions  . Morphine And Related Nausea And Vomiting    Social History:   Social History   Socioeconomic History  . Marital status: Married    Spouse name: [name]  . Number of children: 5  . Years of education: 10 th  . Highest education level: 10th grade  Occupational History  . Occupation: disabled     Comment: disabled  Tobacco Use  . Smoking status: Current Every Day Smoker    Packs/day: 1.00    Years: 47.00    Pack years: 47.00    Types: Cigarettes    Start date: 05/14/1972  . Smokeless tobacco: Never Used  Vaping Use  . Vaping Use: Never used  Substance and Sexual Activity  . Alcohol use: No  Alcohol/week: 0.0 standard drinks  . Drug use: No  . Sexual activity: Not Currently    Birth control/protection: Surgical  Other Topics Concern  . Not on file  Social History Narrative   Patient lives at home with her husband and grandchild [name]). Patient is disabled.   Patient has 10 th grade education.   Right handed.   Caffeine- one  cup of coffee and  One soda Stacey.Pepper/ tea. daily   Social Determinants of Health   Financial Resource Strain: Medium Risk  . Difficulty of Paying Living Expenses: Somewhat hard  Food Insecurity: No Food Insecurity  . Worried About Programme researcher, broadcasting/film/video in the Last Year: Never true  . Ran Out of Food in the Last Year: Never true  Transportation Needs: No Transportation Needs  . Lack of  Transportation (Medical): No  . Lack of Transportation (Non-Medical): No  Physical Activity: Inactive  . Days of Exercise per Week: 0 days  . Minutes of Exercise per Session: 0 min  Stress: Stress Concern Present  . Feeling of Stress : To some extent  Social Connections: Moderately Isolated  . Frequency of Communication with Friends and Family: More than three times a week  . Frequency of Social Gatherings with Friends and Family: More than three times a week  . Attends Religious Services: Never  . Active Member of Clubs or Organizations: No  . Attends Banker Meetings: Never  . Marital Status: Married  Catering manager Violence: Not At Risk  . Fear of Current or Ex-Partner: No  . Emotionally Abused: No  . Physically Abused: No  . Sexually Abused: No    Family History:    Family History  Problem Relation Age of Onset  . Coronary artery disease Father   . Cancer - Colon Father   . Diabetes Father   . High Cholesterol Father   . Breast cancer Mother   . Arthritis Sister   . Asthma Sister   . High Cholesterol Daughter   . Thyroid disease Daughter   . Hiatal hernia Son   . Congenital heart disease Son      ROS:  Please see the history of present illness.  All other ROS reviewed and negative.     Physical Exam/Data:   Vitals:   06/18/20 1236 06/18/20 1330 06/18/20 1413 06/18/20 1440  BP: 128/65 120/66 (!) 144/75 130/71  Pulse: 77 76 72 74  Resp: (!) 22 15    Temp: 97.7 F (36.5 C)     TempSrc: Oral     SpO2: 97% 95%    Weight:      Height:       No intake or output data in the 24 hours ending 06/18/20 1505 Last 3 Weights 06/18/2020 03/03/2020 08/31/2019  Weight (lbs) 370 lb 334 lb 12.8 oz 324 lb 3.2 oz  Weight (kg) 167.831 kg 151.864 kg 147.056 kg     Body mass index is 67.67 kg/m.  General:  Well nourished, well developed, in no acute distress HEENT: normal Lymph: no adenopathy Neck: no JVD Endocrine:  No thryomegaly Vascular: No carotid  bruits; FA pulses 2+ bilaterally without bruits  Cardiac:  normal S1, S2; RRR; no murmur Lungs:  Mild bilateral coarse breath sounds Abd: soft, nontender, no hepatomegaly  Ext: no edema Musculoskeletal:  No deformities, BUE and BLE strength normal and equal Skin: warm and dry  Neuro:  CNs 2-12 intact, no focal abnormalities noted Psych:  Normal affect     Laboratory  Data:  High Sensitivity Troponin:   Recent Labs  Lab 06/18/20 1227 06/18/20 1347  TROPONINIHS 3 3     Chemistry Recent Labs  Lab 06/18/20 1227  NA 138  K 3.5  CL 108  CO2 22  GLUCOSE 94  BUN 9  CREATININE 0.99  CALCIUM 8.8*  GFRNONAA >60  GFRAA >60  ANIONGAP 8    Recent Labs  Lab 06/18/20 1227  PROT 6.7  ALBUMIN 3.8  AST 17  ALT 16  ALKPHOS 77  BILITOT 0.4   Hematology Recent Labs  Lab 06/18/20 1227  WBC 7.6  RBC 4.08  HGB 12.7  HCT 39.3  MCV 96.3  MCH 31.1  MCHC 32.3  RDW 17.2*  PLT 179   BNPNo results for input(s): BNP, PROBNP in the last 168 hours.  DDimer No results for input(s): DDIMER in the last 168 hours.   Radiology/Studies:  DG Chest 2 View  Result Date: 06/18/2020 CLINICAL DATA:  Chest pain EXAM: CHEST - 2 VIEW COMPARISON:  December 28, 2018 FINDINGS: The cardiomediastinal silhouette is unchanged in contour.Atherosclerotic calcifications of the aorta. No pleural effusion. No pneumothorax. No acute pleuroparenchymal abnormality. Visualized abdomen is unremarkable. Multilevel degenerative changes of the thoracic spine. IMPRESSION: No acute cardiopulmonary abnormality. Electronically Signed   By: Meda Klinefelter MD   On: 06/18/2020 13:19   {  Assessment and Plan:   1. CAD, chest pain possible unstable angina.  - history of CAD with prior stent to LCX in 2007 - presents with chest pain. Mixed charactersitics, abnormal in sense its been constant since 10AM. - trops neg thus far, EKG with SR, RBBB with chronic associated anterior TWIs but new anterolateral TWIs/ST  depressions.   - given her history and EKG changes will require ischemic testing. Given her body habitus 367 lbs (BMI 67) and pretest probability of obstructive disease noninvasive imaging of no practical value. Will require a cath for further evaluation.   - medical therapy with ASA, startin, hep gtt, continue home lopressor and ARB - nitro patch, some ongoing chest pain. Morphine listed as causing nausea, reports she can take with nausea meds, will give zofran followed by IV morphine 2mg     I have reviewed the risks, indications, and alternatives to cardiac catheterization, possible angioplasty, and stenting with the patient and daughter today. Risks include but are not limited to bleeding, infection, vascular injury, stroke, myocardial infection, arrhythmia, kidney injury, radiation-related injury in the case of prolonged fluoroscopy use, emergency cardiac surgery, and death. The patient understands the risks of serious complication is 1-2 in 1000 with diagnostic cardiac cath and 1-2% or less with angioplasty/stenting.       For questions or updates, please contact [facility] Please consult [email or web address] for contact info under    Signed, , MD  06/18/2020 3:05 PM

## 2020-06-19 ENCOUNTER — Ambulatory Visit (HOSPITAL_COMMUNITY)
Admission: EM | Disposition: A | Discharge status: Home / Self Care | Payer: Self-pay | Source: Home / Self Care | Attending: Emergency Medicine

## 2020-06-19 DIAGNOSIS — E118 Type 2 diabetes mellitus with unspecified complications: Secondary | ICD-10-CM

## 2020-06-19 DIAGNOSIS — I2510 Atherosclerotic heart disease of native coronary artery without angina pectoris: Secondary | ICD-10-CM | POA: Diagnosis not present

## 2020-06-19 DIAGNOSIS — R072 Precordial pain: Secondary | ICD-10-CM

## 2020-06-19 DIAGNOSIS — Z720 Tobacco use: Secondary | ICD-10-CM | POA: Diagnosis not present

## 2020-06-19 HISTORY — PX: LEFT HEART CATH AND CORONARY ANGIOGRAPHY: CATH118249

## 2020-06-19 LAB — CBC
HCT: 36.5 % (ref 36.0–46.0)
Hemoglobin: 11.6 g/dL — ABNORMAL LOW (ref 12.0–15.0)
MCH: 30.8 pg (ref 26.0–34.0)
MCHC: 31.8 g/dL (ref 30.0–36.0)
MCV: 96.8 fL (ref 80.0–100.0)
Platelets: 164 10*3/uL (ref 150–400)
RBC: 3.77 MIL/uL — ABNORMAL LOW (ref 3.87–5.11)
RDW: 17.3 % — ABNORMAL HIGH (ref 11.5–15.5)
WBC: 6.3 10*3/uL (ref 4.0–10.5)
nRBC: 0 % (ref 0.0–0.2)

## 2020-06-19 LAB — GLUCOSE, CAPILLARY
Glucose-Capillary: 120 mg/dL — ABNORMAL HIGH (ref 70–99)
Glucose-Capillary: 110 mg/dL — ABNORMAL HIGH (ref 70–99)
Glucose-Capillary: 79 mg/dL (ref 70–99)

## 2020-06-19 LAB — BASIC METABOLIC PANEL
Anion gap: 10 (ref 5–15)
BUN: 7 mg/dL — ABNORMAL LOW (ref 8–23)
CO2: 23 mmol/L (ref 22–32)
Calcium: 8.4 mg/dL — ABNORMAL LOW (ref 8.9–10.3)
Chloride: 110 mmol/L (ref 98–111)
Creatinine, Ser: 1.16 mg/dL — ABNORMAL HIGH (ref 0.44–1.00)
GFR calc Af Amer: 59 mL/min — ABNORMAL LOW (ref >60)
GFR calc non Af Amer: 51 mL/min — ABNORMAL LOW (ref >60)
Glucose, Bld: 119 mg/dL — ABNORMAL HIGH (ref 70–99)
Potassium: 3.7 mmol/L (ref 3.5–5.1)
Sodium: 143 mmol/L (ref 135–145)

## 2020-06-19 LAB — MAGNESIUM: Magnesium: 2 mg/dL (ref 1.7–2.4)

## 2020-06-19 LAB — HEPARIN LEVEL (UNFRACTIONATED)
Heparin Unfractionated: <0.1 IU/mL — ABNORMAL LOW (ref 0.30–0.70)
Heparin Unfractionated: <0.1 IU/mL — ABNORMAL LOW (ref 0.30–0.70)

## 2020-06-19 SURGERY — LEFT HEART CATH AND CORONARY ANGIOGRAPHY
Anesthesia: LOCAL

## 2020-06-19 MED ORDER — ACETAMINOPHEN 325 MG PO TABS: 650.0000 mg | ORAL_TABLET | ORAL | Status: DC | PRN

## 2020-06-19 MED ORDER — VERAPAMIL HCL 2.5 MG/ML IV SOLN
INTRAVENOUS | Status: AC
  Filled 2020-06-19: qty 2

## 2020-06-19 MED ORDER — SODIUM CHLORIDE 0.9 % IV SOLN: INTRAVENOUS | Status: DC

## 2020-06-19 MED ORDER — MIDAZOLAM HCL 2 MG/2ML IJ SOLN
INTRAMUSCULAR | Status: AC
  Filled 2020-06-19: qty 2

## 2020-06-19 MED ORDER — LIDOCAINE HCL (PF) 1 % IJ SOLN
INTRAMUSCULAR | Status: AC
  Filled 2020-06-19: qty 30

## 2020-06-19 MED ORDER — ALPRAZOLAM 0.5 MG PO TABS
0.5000 mg | ORAL_TABLET | Freq: Every evening | ORAL | Status: DC | PRN
  Administered 2020-06-19: 0.5 mg via ORAL
  Filled 2020-06-19: qty 1

## 2020-06-19 MED ORDER — SODIUM CHLORIDE 0.9% FLUSH: 3.0000 mL | INTRAVENOUS | Status: DC | PRN

## 2020-06-19 MED ORDER — SODIUM CHLORIDE 0.9 % IV SOLN: 250.0000 mL | INTRAVENOUS | Status: DC | PRN

## 2020-06-19 MED ORDER — ONDANSETRON HCL 4 MG/2ML IJ SOLN: 4.0000 mg | Freq: Four times a day (QID) | INTRAMUSCULAR | Status: DC | PRN

## 2020-06-19 MED ORDER — FENTANYL CITRATE (PF) 100 MCG/2ML IJ SOLN
INTRAMUSCULAR | Status: AC
  Filled 2020-06-19: qty 2

## 2020-06-19 MED ORDER — HEPARIN SODIUM (PORCINE) 1000 UNIT/ML IJ SOLN
INTRAMUSCULAR | Status: DC | PRN
  Administered 2020-06-19: 7000 [IU] via INTRAVENOUS

## 2020-06-19 MED ORDER — MIDAZOLAM HCL 2 MG/2ML IJ SOLN
INTRAMUSCULAR | Status: DC | PRN
  Administered 2020-06-19: 1 mg via INTRAVENOUS

## 2020-06-19 MED ORDER — HEPARIN (PORCINE) IN NACL 1000-0.9 UT/500ML-% IV SOLN
INTRAVENOUS | Status: AC
  Filled 2020-06-19: qty 1000

## 2020-06-19 MED ORDER — LIDOCAINE HCL (PF) 1 % IJ SOLN
INTRAMUSCULAR | Status: DC | PRN
  Administered 2020-06-19: 2 mL

## 2020-06-19 MED ORDER — HEPARIN SODIUM (PORCINE) 5000 UNIT/ML IJ SOLN: 5000.0000 [IU] | Freq: Three times a day (TID) | INTRAMUSCULAR | Status: DC

## 2020-06-19 MED ORDER — IOHEXOL 350 MG/ML SOLN
INTRAVENOUS | Status: DC | PRN
  Administered 2020-06-19: 85 mL via INTRACARDIAC

## 2020-06-19 MED ORDER — HEPARIN BOLUS VIA INFUSION
2000.0000 [IU] | Freq: Once | INTRAVENOUS | Status: AC
  Administered 2020-06-19: 2000 [IU] via INTRAVENOUS
  Filled 2020-06-19: qty 2000

## 2020-06-19 MED ORDER — FENTANYL CITRATE (PF) 100 MCG/2ML IJ SOLN
INTRAMUSCULAR | Status: DC | PRN
  Administered 2020-06-19: 50 ug via INTRAVENOUS

## 2020-06-19 MED ORDER — HEPARIN SODIUM (PORCINE) 1000 UNIT/ML IJ SOLN
INTRAMUSCULAR | Status: AC
  Filled 2020-06-19: qty 1

## 2020-06-19 MED ORDER — SODIUM CHLORIDE 0.9% FLUSH: 3.0000 mL | Freq: Two times a day (BID) | INTRAVENOUS | Status: DC

## 2020-06-19 MED ORDER — LABETALOL HCL 5 MG/ML IV SOLN: 10.0000 mg | INTRAVENOUS | Status: DC | PRN

## 2020-06-19 MED ORDER — HYDRALAZINE HCL 20 MG/ML IJ SOLN: 10.0000 mg | INTRAMUSCULAR | Status: DC | PRN

## 2020-06-19 MED ORDER — HEPARIN (PORCINE) IN NACL 1000-0.9 UT/500ML-% IV SOLN
INTRAVENOUS | Status: DC | PRN
  Administered 2020-06-19 (×3): 500 mL

## 2020-06-19 SURGICAL SUPPLY — 9 items

## 2020-06-19 NOTE — Progress Notes (Addendum)
Progress Note  Patient Name: [name] Date of Encounter: 06/19/2020  [facility] Cardiologist: Nona DellSamuel McDowell, MD   Subjective   Nitro patch relieved pain yesterday, no recurrent CP. Plan for cath this AM. Patient shared she has been very stressed out recently.   Inpatient Medications    Scheduled Meds: . aspirin EC  81 mg Oral q morning - 10a  . gabapentin  600 mg Oral TID  . insulin aspart  0-15 Units Subcutaneous TID WC  . insulin aspart  0-5 Units Subcutaneous QHS  . irbesartan  150 mg Oral Daily  . metoprolol tartrate  25 mg Oral BID  . nicotine  7 mg Transdermal Daily  . nitroGLYCERIN  0.5 inch Topical Q6H  . pantoprazole  40 mg Oral Daily  . rosuvastatin  20 mg Oral QHS  . sodium chloride flush  3 mL Intravenous Q12H  . sodium chloride flush  3 mL Intravenous Q12H   Continuous Infusions: . sodium chloride    . sodium chloride    . sodium chloride 1 mL/kg/hr (06/19/20 0511)  . heparin 1,650 Units/hr (06/19/20 0101)   PRN Meds: sodium chloride, sodium chloride, acetaminophen **OR** acetaminophen, albuterol, nitroGLYCERIN, ondansetron **OR** ondansetron (ZOFRAN) IV, sodium chloride flush, sodium chloride flush   Vital Signs    Vitals:   06/19/20 0414 06/19/20 0500 06/19/20 0508 06/19/20 0600  BP: 119/61     Pulse: 76     Resp: 19 19 (!) 22 19  Temp: (!) 97.5 F (36.4 C)     TempSrc: Oral     SpO2: 90% 92% 93% 93%  Weight:      Height:        Intake/Output Summary (Last 24 hours) at 06/19/2020 0746 Last data filed at 06/19/2020 0600 Gross per 24 hour  Intake 1326.43 ml  Output 250 ml  Net 1076.43 ml   Last 3 Weights 06/19/2020 06/18/2020 03/03/2020  Weight (lbs) 320 lb 12.3 oz 370 lb 334 lb 12.8 oz  Weight (kg) 145.5 kg 167.831 kg 151.864 kg      Telemetry    NSR, HR60-70, PVCs - Personally Reviewed  ECG    No new - Personally Reviewed  Physical Exam   GEN: No acute distress.   Neck: No JVD Cardiac: RRR, no murmurs, rubs, or  gallops.  Respiratory: diffusely diminished GI: Soft, nontender, non-distended  MS: No edema; No deformity. Neuro:  Nonfocal  Psych: Normal affect   Labs    High Sensitivity Troponin:   Recent Labs  Lab 06/18/20 1227 06/18/20 1347 06/18/20 1823  TROPONINIHS 3 3 2       Chemistry Recent Labs  Lab 06/18/20 1227  NA 138  K 3.5  CL 108  CO2 22  GLUCOSE 94  BUN 9  CREATININE 0.99  CALCIUM 8.8*  PROT 6.7  ALBUMIN 3.8  AST 17  ALT 16  ALKPHOS 77  BILITOT 0.4  GFRNONAA >60  GFRAA >60  ANIONGAP 8     Hematology Recent Labs  Lab 06/18/20 1227  WBC 7.6  RBC 4.08  HGB 12.7  HCT 39.3  MCV 96.3  MCH 31.1  MCHC 32.3  RDW 17.2*  PLT 179    BNP Recent Labs  Lab 06/18/20 1227  BNP 56.0     DDimer No results for input(s): DDIMER in the last 168 hours.   Radiology    DG Chest 2 View  Result Date: 06/18/2020 CLINICAL DATA:  Chest pain EXAM: CHEST - 2 VIEW COMPARISON:  December 28, 2018 FINDINGS: The cardiomediastinal silhouette is unchanged in contour.Atherosclerotic calcifications of the aorta. No pleural effusion. No pneumothorax. No acute pleuroparenchymal abnormality. Visualized abdomen is unremarkable. Multilevel degenerative changes of the thoracic spine. IMPRESSION: No acute cardiopulmonary abnormality. Electronically Signed   By: Meda Klinefelter MD   On: 06/18/2020 13:19   ECHOCARDIOGRAM COMPLETE  Result Date: 06/18/2020    ECHOCARDIOGRAM REPORT   Patient Name:   [name] Date of Exam: 06/18/2020 Medical Rec #:  [id number]     Height:       62.0 in Accession #:    [id number]    Weight:       370.0 lb Date of Birth:  [birth year]      BSA:          2.483 m Patient Age:    59 years      BP:           130/71 mmHg Patient Gender: F             HR:           74 bpm. [facility] Procedure: 2D Echo Indications:    Chest Pain 786.50 / R07.9  History:        Patient has prior history of Echocardiogram examinations, most                 recent  09/14/2013. CAD, Signs/Symptoms:Chest Pain; Risk                 Factors:Dyslipidemia, Current Smoker and Diabetes.  Sonographer:    Jeryl Columbia RDCS (AE) Referring Phys: [phone] Lamont Dowdy [facility] IMPRESSIONS  1. Left ventricular ejection fraction, by estimation, is 50 to 55%. The left ventricle has normal function. The left ventricle has no regional wall motion abnormalities. There is mild left ventricular hypertrophy. Left ventricular diastolic parameters are consistent with Grade I diastolic dysfunction (impaired relaxation). Elevated left atrial pressure.  2. Right ventricular systolic function is normal. The right ventricular size is normal.  3. The mitral valve is normal in structure. Mild mitral valve regurgitation. No evidence of mitral stenosis.  4. The aortic valve has an indeterminant number of cusps. Aortic valve regurgitation is not visualized. No aortic stenosis is present.  5. The inferior vena cava is normal in size with greater than 50% respiratory variability, suggesting right atrial pressure of 3 mmHg. FINDINGS  Left Ventricle: Left ventricular ejection fraction, by estimation, is 50 to 55%. The left ventricle has normal function. The left ventricle has no regional wall motion abnormalities. The left ventricular internal cavity size was normal in size. There is  mild left ventricular hypertrophy. Left ventricular diastolic parameters are consistent with Grade I diastolic dysfunction (impaired relaxation). Elevated left atrial pressure. Right Ventricle: The right ventricular size is normal. No increase in right ventricular wall thickness. Right ventricular systolic function is normal. Left Atrium: Left atrial size was normal in size. Right Atrium: Right atrial size was normal in size. Pericardium: There is no evidence of pericardial effusion. Presence of pericardial fat pad. Mitral Valve: The mitral valve is normal in structure. Mild mitral valve regurgitation. No evidence of mitral valve stenosis.  Tricuspid Valve: The tricuspid valve is normal in structure. Tricuspid valve regurgitation is not demonstrated. No evidence of tricuspid stenosis. Aortic Valve: The aortic valve has an indeterminant number of cusps. Aortic valve regurgitation is not visualized. No aortic stenosis is present. Aortic valve mean gradient measures 5.9 mmHg. Aortic valve peak gradient  measures 11.8 mmHg. Aortic valve area, by VTI measures 2.36 cm. Pulmonic Valve: The pulmonic valve was not well visualized. Pulmonic valve regurgitation is not visualized. No evidence of pulmonic stenosis. Aorta: The aortic root is normal in size and structure. Pulmonary Artery: Indeterminate PASP, inadequate TR jet. Venous: The inferior vena cava is normal in size with greater than 50% respiratory variability, suggesting right atrial pressure of 3 mmHg. IAS/Shunts: No atrial level shunt detected by color flow Doppler.  LEFT VENTRICLE PLAX 2D LVIDd:         5.09 cm  Diastology LVIDs:         3.85 cm  LV e' lateral:   3.59 cm/s LV PW:         1.10 cm  LV E/e' lateral: 17.9 LV IVS:        1.07 cm  LV e' medial:    5.44 cm/s LVOT diam:     1.90 cm  LV E/e' medial:  11.8 LV SV:         85 LV SV Index:   34 LVOT Area:     2.84 cm  RIGHT VENTRICLE RV S prime:     9.57 cm/s TAPSE (M-mode): 1.6 cm LEFT ATRIUM             Index       RIGHT ATRIUM           Index LA diam:        4.10 cm 1.65 cm/m  RA Area:     15.10 cm LA Vol (A2C):   37.2 ml 14.98 ml/m RA Volume:   39.90 ml  16.07 ml/m LA Vol (A4C):   49.9 ml 20.10 ml/m LA Biplane Vol: 44.6 ml 17.96 ml/m  AORTIC VALVE AV Area (Vmax):    2.46 cm AV Area (Vmean):   2.33 cm AV Area (VTI):     2.36 cm AV Vmax:           171.62 cm/s AV Vmean:          115.241 cm/s AV VTI:            0.361 m AV Peak Grad:      11.8 mmHg AV Mean Grad:      5.9 mmHg LVOT Vmax:         148.78 cm/s LVOT Vmean:        94.906 cm/s LVOT VTI:          0.301 m LVOT/AV VTI ratio: 0.83  AORTA Ao Root diam: 2.80 cm MITRAL VALVE MV Area  (PHT): 2.48 cm    SHUNTS MV Decel Time: 306 msec    Systemic VTI:  0.30 m MV E velocity: 64.30 cm/s  Systemic Diam: 1.90 cm MV A velocity: 91.70 cm/s MV E/A ratio:  0.70 Dina Rich MD Electronically signed by Dina Rich MD Signature Date/Time: 06/18/2020/4:00:07 PM    Final     Cardiac Studies   Lexiscan 03/2018  No diagnostic ST segment changes to indicate ischemia.  Small, mild intensity, mid to apical anterior defect that is more prominent at rest and consistent with soft tissue attenuation. No clear ischemic territories.  This is a low risk study.  Nuclear stress EF: 68%.   Echo 06/18/20  1. Left ventricular ejection fraction, by estimation, is 50 to 55%. The  left ventricle has normal function. The left ventricle has no regional  wall motion abnormalities. There is mild left ventricular hypertrophy.  Left ventricular diastolic parameters  are consistent  with Grade I diastolic dysfunction (impaired relaxation).  Elevated left atrial pressure.  2. Right ventricular systolic function is normal. The right ventricular  size is normal.  3. The mitral valve is normal in structure. Mild mitral valve  regurgitation. No evidence of mitral stenosis.  4. The aortic valve has an indeterminant number of cusps. Aortic valve  regurgitation is not visualized. No aortic stenosis is present.  5. The inferior vena cava is normal in size with greater than 50%  respiratory variability, suggesting right atrial pressure of 3 mmHg.   Patient Profile     59 y.o. female pmh of CAD s/p BMS to LCX in 2007, HL, OSA, DM2, CKD stage 3, chronic lymphedema who is being seen for chest pain.   Assessment & Plan    Chest pain/CAD s/p BMS to LCX in 2007 - presented with radiating chest pain with associated sob and diaphoresis - HS troponin 3>3 - EKG shows SR with RBBB anterolateral TWIs and ST depression. Lateral changes appear to be new - IV heparin - continue aspirin, rosuvastatin 20 mg  daily, lopressor 25mg  BID, Irbesartan 150 mg daily - Nitro patch applied for ongoing chest pain. No recurrent - According to chart review she had a cardiac cath in 2010 (unable to fine report in epic) which showed nonobstructive CAD with widely patent stent and normal Lv function. Last ischemic eval was in 2019 with Lexiscan with was a low risk study showing small, mild intensity mid to apical anterior defect that is more prominent at rest and consistent with soft tissue attenuation, no clear ischemia seen, EF 68% - Echo this admission showed LVEF 50-55%, G1DD, mild MR - Patient is not a good candidate for myoview given body habitus - plan for cardiac cath today  HLD - crestor 20 mg daily - LDL 41, TG 161, HDL 37, total 105 on 03/03/20  DM2 - SSI - A1C 8.4 on 06/18/20  Tobacco abuse - recommend cessation  CKD stage 3 - creatinine 0.99  OSA - uses CPAP  For questions or updates, please contact [facility] Please consult [email or web address] for contact info under        Signed, Cadence 08/18/20, PA-C  06/19/2020, 7:46 AM    History and all data above reviewed.  Patient examined.  I agree with the findings as above.   No further chest pain.  The patient exam reveals COR:RRR  ,  Lungs: Clear  ,  Abd: Positive bowel sounds, no rebound no guarding, Ext No edema  .  All available labs, radiology testing, previous records reviewed. Agree with documented assessment and plan.   CHEST PAIN:  Cath today.  Home if not need for intervention.  CKD:  Creat OK as above.  No change in therapy.  DM:  Needs follow up with 08/19/2020, DO.    TOBACCO:  Educated.    [name] Trygve Thal  12:01 PM  06/19/2020

## 2020-06-19 NOTE — Interval H&P Note (Signed)
Cath Lab Visit (complete for each Cath Lab visit)  Clinical Evaluation Leading to the Procedure:   ACS: Yes.    Non-ACS:    Anginal Classification: CCS III  Anti-ischemic medical therapy: Minimal Therapy (1 class of medications)  Non-Invasive Test Results: No non-invasive testing performed  Prior CABG: No previous CABG      History and Physical Interval Note:  06/19/2020 2:43 PM  [name]  has presented today for surgery, with the diagnosis of unstable angina.  The various methods of treatment have been discussed with the patient and family. After consideration of risks, benefits and other options for treatment, the patient has consented to  Procedure(s): LEFT HEART CATH AND CORONARY ANGIOGRAPHY (N/A) as a surgical intervention.  The patient's history has been reviewed, patient examined, no change in status, stable for surgery.  I have reviewed the patient's chart and labs.  Questions were answered to the patient's satisfaction.     Lyn Records III

## 2020-06-19 NOTE — CV Procedure (Signed)
   Diagnostic coronary angiography, left ventriculography, and left heart catheterization via right radial.  Access achieved with real-time vascular ultrasound.  Widely patent left main  Mid LAD after the first diagonal contains calcium and 30 to 50% narrowing after the bifurcation with large diagonal.  First diagonal also contains 50% narrowing.  Circumflex is large.  The previously placed first obtuse marginal stent is widely patent.  Proximal to the stent there is 30% narrowing.  Right coronary is codominant with diffuse luminal irregularities but no high-grade obstruction.  LV function is normal.  LVEDP is 18 mmHg.  Overall, the coronaries are widely patent without high-grade obstruction or obvious culprit lesion.

## 2020-06-19 NOTE — H&P (View-Only) (Signed)
 Progress Note  Patient Name: [name] Date of Encounter: 06/19/2020  [facility] Cardiologist: Samuel McDowell, MD   Subjective   Nitro patch relieved pain yesterday, no recurrent CP. Plan for cath this AM. Patient shared she has been very stressed out recently.   Inpatient Medications    Scheduled Meds: . aspirin EC  81 mg Oral q morning - 10a  . gabapentin  600 mg Oral TID  . insulin aspart  0-15 Units Subcutaneous TID WC  . insulin aspart  0-5 Units Subcutaneous QHS  . irbesartan  150 mg Oral Daily  . metoprolol tartrate  25 mg Oral BID  . nicotine  7 mg Transdermal Daily  . nitroGLYCERIN  0.5 inch Topical Q6H  . pantoprazole  40 mg Oral Daily  . rosuvastatin  20 mg Oral QHS  . sodium chloride flush  3 mL Intravenous Q12H  . sodium chloride flush  3 mL Intravenous Q12H   Continuous Infusions: . sodium chloride    . sodium chloride    . sodium chloride 1 mL/kg/hr (06/19/20 0511)  . heparin 1,650 Units/hr (06/19/20 0101)   PRN Meds: sodium chloride, sodium chloride, acetaminophen **OR** acetaminophen, albuterol, nitroGLYCERIN, ondansetron **OR** ondansetron (ZOFRAN) IV, sodium chloride flush, sodium chloride flush   Vital Signs    Vitals:   06/19/20 0414 06/19/20 0500 06/19/20 0508 06/19/20 0600  BP: 119/61     Pulse: 76     Resp: 19 19 (!) 22 19  Temp: (!) 97.5 F (36.4 C)     TempSrc: Oral     SpO2: 90% 92% 93% 93%  Weight:      Height:        Intake/Output Summary (Last 24 hours) at 06/19/2020 0746 Last data filed at 06/19/2020 0600 Gross per 24 hour  Intake 1326.43 ml  Output 250 ml  Net 1076.43 ml   Last 3 Weights 06/19/2020 06/18/2020 03/03/2020  Weight (lbs) 320 lb 12.3 oz 370 lb 334 lb 12.8 oz  Weight (kg) 145.5 kg 167.831 kg 151.864 kg      Telemetry    NSR, HR60-70, PVCs - Personally Reviewed  ECG    No new - Personally Reviewed  Physical Exam   GEN: No acute distress.   Neck: No JVD Cardiac: RRR, no murmurs, rubs, or  gallops.  Respiratory: diffusely diminished GI: Soft, nontender, non-distended  MS: No edema; No deformity. Neuro:  Nonfocal  Psych: Normal affect   Labs    High Sensitivity Troponin:   Recent Labs  Lab 06/18/20 1227 06/18/20 1347 06/18/20 1823  TROPONINIHS 3 3 2      Chemistry Recent Labs  Lab 06/18/20 1227  NA 138  K 3.5  CL 108  CO2 22  GLUCOSE 94  BUN 9  CREATININE 0.99  CALCIUM 8.8*  PROT 6.7  ALBUMIN 3.8  AST 17  ALT 16  ALKPHOS 77  BILITOT 0.4  GFRNONAA >60  GFRAA >60  ANIONGAP 8     Hematology Recent Labs  Lab 06/18/20 1227  WBC 7.6  RBC 4.08  HGB 12.7  HCT 39.3  MCV 96.3  MCH 31.1  MCHC 32.3  RDW 17.2*  PLT 179    BNP Recent Labs  Lab 06/18/20 1227  BNP 56.0     DDimer No results for input(s): DDIMER in the last 168 hours.   Radiology    DG Chest 2 View  Result Date: 06/18/2020 CLINICAL DATA:  Chest pain EXAM: CHEST - 2 VIEW COMPARISON:    December 28, 2018 FINDINGS: The cardiomediastinal silhouette is unchanged in contour.Atherosclerotic calcifications of the aorta. No pleural effusion. No pneumothorax. No acute pleuroparenchymal abnormality. Visualized abdomen is unremarkable. Multilevel degenerative changes of the thoracic spine. IMPRESSION: No acute cardiopulmonary abnormality. Electronically Signed   By: Meda Klinefelter MD   On: 06/18/2020 13:19   ECHOCARDIOGRAM COMPLETE  Result Date: 06/18/2020    ECHOCARDIOGRAM REPORT   Patient Name:   [name] Date of Exam: 06/18/2020 Medical Rec #:  [id number]     Height:       62.0 in Accession #:    [id number]    Weight:       370.0 lb Date of Birth:  [birth year]      BSA:          2.483 m Patient Age:    59 years      BP:           130/71 mmHg Patient Gender: F             HR:           74 bpm. [facility] Procedure: 2D Echo Indications:    Chest Pain 786.50 / R07.9  History:        Patient has prior history of Echocardiogram examinations, most                 recent  09/14/2013. CAD, Signs/Symptoms:Chest Pain; Risk                 Factors:Dyslipidemia, Current Smoker and Diabetes.  Sonographer:    Jeryl Columbia RDCS (AE) Referring Phys: [phone] Lamont Dowdy [facility] IMPRESSIONS  1. Left ventricular ejection fraction, by estimation, is 50 to 55%. The left ventricle has normal function. The left ventricle has no regional wall motion abnormalities. There is mild left ventricular hypertrophy. Left ventricular diastolic parameters are consistent with Grade I diastolic dysfunction (impaired relaxation). Elevated left atrial pressure.  2. Right ventricular systolic function is normal. The right ventricular size is normal.  3. The mitral valve is normal in structure. Mild mitral valve regurgitation. No evidence of mitral stenosis.  4. The aortic valve has an indeterminant number of cusps. Aortic valve regurgitation is not visualized. No aortic stenosis is present.  5. The inferior vena cava is normal in size with greater than 50% respiratory variability, suggesting right atrial pressure of 3 mmHg. FINDINGS  Left Ventricle: Left ventricular ejection fraction, by estimation, is 50 to 55%. The left ventricle has normal function. The left ventricle has no regional wall motion abnormalities. The left ventricular internal cavity size was normal in size. There is  mild left ventricular hypertrophy. Left ventricular diastolic parameters are consistent with Grade I diastolic dysfunction (impaired relaxation). Elevated left atrial pressure. Right Ventricle: The right ventricular size is normal. No increase in right ventricular wall thickness. Right ventricular systolic function is normal. Left Atrium: Left atrial size was normal in size. Right Atrium: Right atrial size was normal in size. Pericardium: There is no evidence of pericardial effusion. Presence of pericardial fat pad. Mitral Valve: The mitral valve is normal in structure. Mild mitral valve regurgitation. No evidence of mitral valve stenosis.  Tricuspid Valve: The tricuspid valve is normal in structure. Tricuspid valve regurgitation is not demonstrated. No evidence of tricuspid stenosis. Aortic Valve: The aortic valve has an indeterminant number of cusps. Aortic valve regurgitation is not visualized. No aortic stenosis is present. Aortic valve mean gradient measures 5.9 mmHg. Aortic valve peak gradient  measures 11.8 mmHg. Aortic valve area, by VTI measures 2.36 cm. Pulmonic Valve: The pulmonic valve was not well visualized. Pulmonic valve regurgitation is not visualized. No evidence of pulmonic stenosis. Aorta: The aortic root is normal in size and structure. Pulmonary Artery: Indeterminate PASP, inadequate TR jet. Venous: The inferior vena cava is normal in size with greater than 50% respiratory variability, suggesting right atrial pressure of 3 mmHg. IAS/Shunts: No atrial level shunt detected by color flow Doppler.  LEFT VENTRICLE PLAX 2D LVIDd:         5.09 cm  Diastology LVIDs:         3.85 cm  LV e' lateral:   3.59 cm/s LV PW:         1.10 cm  LV E/e' lateral: 17.9 LV IVS:        1.07 cm  LV e' medial:    5.44 cm/s LVOT diam:     1.90 cm  LV E/e' medial:  11.8 LV SV:         85 LV SV Index:   34 LVOT Area:     2.84 cm  RIGHT VENTRICLE RV S prime:     9.57 cm/s TAPSE (M-mode): 1.6 cm LEFT ATRIUM             Index       RIGHT ATRIUM           Index LA diam:        4.10 cm 1.65 cm/m  RA Area:     15.10 cm LA Vol (A2C):   37.2 ml 14.98 ml/m RA Volume:   39.90 ml  16.07 ml/m LA Vol (A4C):   49.9 ml 20.10 ml/m LA Biplane Vol: 44.6 ml 17.96 ml/m  AORTIC VALVE AV Area (Vmax):    2.46 cm AV Area (Vmean):   2.33 cm AV Area (VTI):     2.36 cm AV Vmax:           171.62 cm/s AV Vmean:          115.241 cm/s AV VTI:            0.361 m AV Peak Grad:      11.8 mmHg AV Mean Grad:      5.9 mmHg LVOT Vmax:         148.78 cm/s LVOT Vmean:        94.906 cm/s LVOT VTI:          0.301 m LVOT/AV VTI ratio: 0.83  AORTA Ao Root diam: 2.80 cm MITRAL VALVE MV Area  (PHT): 2.48 cm    SHUNTS MV Decel Time: 306 msec    Systemic VTI:  0.30 m MV E velocity: 64.30 cm/s  Systemic Diam: 1.90 cm MV A velocity: 91.70 cm/s MV E/A ratio:  0.70 Dina Rich MD Electronically signed by Dina Rich MD Signature Date/Time: 06/18/2020/4:00:07 PM    Final     Cardiac Studies   Lexiscan 03/2018  No diagnostic ST segment changes to indicate ischemia.  Small, mild intensity, mid to apical anterior defect that is more prominent at rest and consistent with soft tissue attenuation. No clear ischemic territories.  This is a low risk study.  Nuclear stress EF: 68%.   Echo 06/18/20  1. Left ventricular ejection fraction, by estimation, is 50 to 55%. The  left ventricle has normal function. The left ventricle has no regional  wall motion abnormalities. There is mild left ventricular hypertrophy.  Left ventricular diastolic parameters  are consistent  with Grade I diastolic dysfunction (impaired relaxation).  Elevated left atrial pressure.  2. Right ventricular systolic function is normal. The right ventricular  size is normal.  3. The mitral valve is normal in structure. Mild mitral valve  regurgitation. No evidence of mitral stenosis.  4. The aortic valve has an indeterminant number of cusps. Aortic valve  regurgitation is not visualized. No aortic stenosis is present.  5. The inferior vena cava is normal in size with greater than 50%  respiratory variability, suggesting right atrial pressure of 3 mmHg.   Patient Profile     59 y.o. female pmh of CAD s/p BMS to LCX in 2007, HL, OSA, DM2, CKD stage 3, chronic lymphedema who is being seen for chest pain.   Assessment & Plan    Chest pain/CAD s/p BMS to LCX in 2007 - presented with radiating chest pain with associated sob and diaphoresis - HS troponin 3>3 - EKG shows SR with RBBB anterolateral TWIs and ST depression. Lateral changes appear to be new - IV heparin - continue aspirin, rosuvastatin 20 mg  daily, lopressor 25mg  BID, Irbesartan 150 mg daily - Nitro patch applied for ongoing chest pain. No recurrent - According to chart review she had a cardiac cath in 2010 (unable to fine report in epic) which showed nonobstructive CAD with widely patent stent and normal Lv function. Last ischemic eval was in 2019 with Lexiscan with was a low risk study showing small, mild intensity mid to apical anterior defect that is more prominent at rest and consistent with soft tissue attenuation, no clear ischemia seen, EF 68% - Echo this admission showed LVEF 50-55%, G1DD, mild MR - Patient is not a good candidate for myoview given body habitus - plan for cardiac cath today  HLD - crestor 20 mg daily - LDL 41, TG 161, HDL 37, total 105 on 03/03/20  DM2 - SSI - A1C 8.4 on 06/18/20  Tobacco abuse - recommend cessation  CKD stage 3 - creatinine 0.99  OSA - uses CPAP  For questions or updates, please contact [facility] Please consult [email or web address] for contact info under        Signed, Cadence 08/18/20, PA-C  06/19/2020, 7:46 AM    History and all data above reviewed.  Patient examined.  I agree with the findings as above.   No further chest pain.  The patient exam reveals COR:RRR  ,  Lungs: Clear  ,  Abd: Positive bowel sounds, no rebound no guarding, Ext No edema  .  All available labs, radiology testing, previous records reviewed. Agree with documented assessment and plan.   CHEST PAIN:  Cath today.  Home if not need for intervention.  CKD:  Creat OK as above.  No change in therapy.  DM:  Needs follow up with 08/19/2020, DO.    TOBACCO:  Educated.    [name] Monai Hindes  12:01 PM  06/19/2020

## 2020-06-19 NOTE — Discharge Summary (Signed)
Discharge Summary    Patient ID: [name] MRN: [id number]; DOB: Oct 22, 1961  Admit date: 06/18/2020 Discharge date: 06/19/2020  Primary Care Provider: Janora Norlander, DO  Primary Cardiologist: Stacey Lesches, MD  Primary Electrophysiologist:  None   Discharge Diagnoses    Principal Problem:   Unstable angina [facility]) Active Problems:   Coronary artery disease   Hyperlipidemia associated with type 2 diabetes mellitus ([place])   Cigarette smoker   OSA (obstructive sleep apnea)   Chest pain   Hypertension associated with diabetes ([place])   Moderate nonproliferative diabetic retinopathy of both eyes without macular edema associated with type 2 diabetes mellitus ([place])    Diagnostic Studies/Procedures    Echo 06/18/2020 1. Left ventricular ejection fraction, by estimation, is 50 to 55%. The  left ventricle has normal function. The left ventricle has no regional  wall motion abnormalities. There is mild left ventricular hypertrophy.  Left ventricular diastolic parameters  are consistent with Grade I diastolic dysfunction (impaired relaxation).  Elevated left atrial pressure.  2. Right ventricular systolic function is normal. The right ventricular  size is normal.  3. The mitral valve is normal in structure. Mild mitral valve  regurgitation. No evidence of mitral stenosis.  4. The aortic valve has an indeterminant number of cusps. Aortic valve  regurgitation is not visualized. No aortic stenosis is present.  5. The inferior vena cava is normal in size with greater than 50%  respiratory variability, suggesting right atrial pressure of 3 mmHg.    Cath 06/19/2020  2nd Mrg lesion is 30% stenosed.    Codominant right coronary with moderate diffuse atherosclerosis.  No high-grade obstructive disease.  Widely patent left main.  LAD gives origin to one large diagonal.  Distal to the bifurcation with the diagonal the LAD contains eccentric 40 to 50% narrowing within a region  of calcification.  The ostium of the diagonal is 25% narrowed.  No high-grade focal stenosis is noted in the LAD or diagonal territory.  Circumflex is codominant.  The obtuse marginal in the large second diagonal contains diffuse in-stent restenosis which narrows the lumen by 30%.  No high-grade obstruction is noted in the circumflex territory.  Normal left ventricular function with upper normal LV EDP of 18 mmHg.  EF 60% or greater.  RECOMMENDATIONS:   Continued patency of the bare-metal stent in the obtuse marginal.  No high-grade obstructive disease that can be implicated as a source/cause of the patient's presenting symptoms.\  Further management and work-up as directed by the primary team.   _____________   History of Present Illness     [name] is a 59 y.o. female with a hx of CAD who is being seen today for the evaluation of chest pain at the request of [name]. [name] 59 yo female history of CAD with BMS to LCX in 2007, HL, OSA, DM2, chronic lymphedema,  presents with chest pain.  Reports sudden onset of chest pain while sitting in the kitchen around 10AM. Started left forearm and radiated up arm into left shoulder and chest. +diaphoertic. +SOB. Not positional. Mild improvement with NG. Pain has been constant since onset at 10AM but varying in severity. Not positional.  Hospital Course     Consultants: N/A  Patient was admitted for chest pain.  Initial EKG showed sinus rhythm with right bundle branch block, anterolateral T wave inversion and ST depression.  Lateral changes appears to be new.  Echocardiogram obtained on 06/18/2020 showed EF 53 to 55%, grade  1 DD, moderate MR.  Decision was made to proceed with cardiac catheterization for definitive evaluation.  Patient underwent planned cardiac catheterization on 06/19/2020 which showed left widely patent left main, 30 to 50% mid LAD lesion, 50% D1 lesion, widely patent OM stent, codominant RCA with diffuse luminal [name]  regularities however no high-grade lesion, normal EF, LVEDP 18 mmHg.  Otherwise no obvious high-grade lesion was found to explain the patient's symptoms.  She is deemed stable for discharge from cardiology perspective by [name].  Patient was seen post cath at which time she was doing well.  Her Lasix was held during this admission.  Even though her Lasix is listed as as needed, she is actually taking it every other day at home.  I instructed her to restart her home diuretic.  She will need follow-up in 1 to 2 weeks in our [name] office which we will arrange.  Did the patient have an acute coronary syndrome (MI, NSTEMI, STEMI, etc) this admission?:  No                               Did the patient have a percutaneous coronary intervention (stent / angioplasty)?:  No.   _____________  Discharge Vitals Blood pressure (!) 146/65, pulse 73, temperature 97.9 F (36.6 C), temperature source Oral, resp. rate 14, height '5\' 2"'  (1.575 m), weight (!) 145.5 kg, SpO2 99 %.  Filed Weights   06/18/20 1204 06/19/20 0000  Weight: (!) 167.8 kg (!) 145.5 kg    Labs & Radiologic Studies    CBC Recent Labs    06/18/20 1227 06/19/20 0816  WBC 7.6 6.3  NEUTROABS 3.8  --   HGB 12.7 11.6*  HCT 39.3 36.5  MCV 96.3 96.8  PLT 179 659   Basic Metabolic Panel Recent Labs    06/18/20 1227 06/19/20 0816  NA 138 143  K 3.5 3.7  CL 108 110  CO2 22 23  GLUCOSE 94 119*  BUN 9 7*  CREATININE 0.99 1.16*  CALCIUM 8.8* 8.4*  MG  --  2.0   Liver Function Tests Recent Labs    06/18/20 1227  AST 17  ALT 16  ALKPHOS 77  BILITOT 0.4  PROT 6.7  ALBUMIN 3.8   No results for input(s): LIPASE, AMYLASE in the last 72 hours. High Sensitivity Troponin:   Recent Labs  Lab 06/18/20 1227 06/18/20 1347 06/18/20 1823  TROPONINIHS '3 3 2    ' BNP Invalid input(s): POCBNP D-Dimer No results for input(s): DDIMER in the last 72 hours. Hemoglobin A1C Recent Labs    06/18/20 1652  HGBA1C 8.4*   Fasting  Lipid Panel No results for input(s): CHOL, HDL, LDLCALC, TRIG, CHOLHDL, LDLDIRECT in the last 72 hours. Thyroid Function Tests No results for input(s): TSH, T4TOTAL, T3FREE, THYROIDAB in the last 72 hours.  Invalid input(s): FREET3 _____________  DG Chest 2 View  Result Date: 06/18/2020 CLINICAL DATA:  Chest pain EXAM: CHEST - 2 VIEW COMPARISON:  December 28, 2018 FINDINGS: The cardiomediastinal silhouette is unchanged in contour.Atherosclerotic calcifications of the aorta. No pleural effusion. No pneumothorax. No acute pleuroparenchymal abnormality. Visualized abdomen is unremarkable. Multilevel degenerative changes of the thoracic spine. IMPRESSION: No acute cardiopulmonary abnormality. Electronically Signed   By: Valentino Saxon MD   On: 06/18/2020 13:19   CARDIAC CATHETERIZATION  Result Date: 06/19/2020  2nd Mrg lesion is 30% stenosed.   Codominant right coronary with moderate diffuse atherosclerosis.  No high-grade obstructive disease.  Widely patent left main.  LAD gives origin to one large diagonal.  Distal to the bifurcation with the diagonal the LAD contains eccentric 40 to 50% narrowing within a region of calcification.  The ostium of the diagonal is 25% narrowed.  No high-grade focal stenosis is noted in the LAD or diagonal territory.  Circumflex is codominant.  The obtuse marginal in the large second diagonal contains diffuse in-stent restenosis which narrows the lumen by 30%.  No high-grade obstruction is noted in the circumflex territory.  Normal left ventricular function with upper normal LV EDP of 18 mmHg.  EF 60% or greater. RECOMMENDATIONS:  Continued patency of the bare-metal stent in the obtuse marginal.  No high-grade obstructive disease that can be implicated as a source/cause of the patient's presenting symptoms.\  Further management and work-up as directed by the primary team.   ECHOCARDIOGRAM COMPLETE  Result Date: 06/18/2020    ECHOCARDIOGRAM REPORT   Patient  Name:   [name] Date of Exam: 06/18/2020 Medical Rec #:  [id number]     Height:       62.0 in Accession #:    [id number]    Weight:       370.0 lb Date of Birth:  03/28/1961      BSA:          2.483 m Patient Age:    59 years      BP:           130/71 mmHg Patient Gender: F             HR:           74 bpm. Exam Location:  [facility] Procedure: 2D Echo Indications:    Chest Pain 786.50 / R07.9  History:        Patient has prior history of Echocardiogram examinations, most                 recent 09/14/2013. CAD, Signs/Symptoms:Chest Pain; Risk                 Factors:Dyslipidemia, Current Smoker and Diabetes.  Sonographer:    Leavy Cella RDCS (AE) Referring Phys: [phone] Royanne Foots [place]  1. Left ventricular ejection fraction, by estimation, is 50 to 55%. The left ventricle has normal function. The left ventricle has no regional wall motion abnormalities. There is mild left ventricular hypertrophy. Left ventricular diastolic parameters are consistent with Grade I diastolic dysfunction (impaired relaxation). Elevated left atrial pressure.  2. Right ventricular systolic function is normal. The right ventricular size is normal.  3. The mitral valve is normal in structure. Mild mitral valve regurgitation. No evidence of mitral stenosis.  4. The aortic valve has an indeterminant number of cusps. Aortic valve regurgitation is not visualized. No aortic stenosis is present.  5. The inferior vena cava is normal in size with greater than 50% respiratory variability, suggesting right atrial pressure of 3 mmHg. FINDINGS  Left Ventricle: Left ventricular ejection fraction, by estimation, is 50 to 55%. The left ventricle has normal function. The left ventricle has no regional wall motion abnormalities. The left ventricular internal cavity size was normal in size. There is  mild left ventricular hypertrophy. Left ventricular diastolic parameters are consistent with Grade I diastolic dysfunction (impaired relaxation).  Elevated left atrial pressure. Right Ventricle: The right ventricular size is normal. No increase in right ventricular wall thickness. Right ventricular systolic function is normal. Left Atrium: Left atrial size was  normal in size. Right Atrium: Right atrial size was normal in size. Pericardium: There is no evidence of pericardial effusion. Presence of pericardial fat pad. Mitral Valve: The mitral valve is normal in structure. Mild mitral valve regurgitation. No evidence of mitral valve stenosis. Tricuspid Valve: The tricuspid valve is normal in structure. Tricuspid valve regurgitation is not demonstrated. No evidence of tricuspid stenosis. Aortic Valve: The aortic valve has an indeterminant number of cusps. Aortic valve regurgitation is not visualized. No aortic stenosis is present. Aortic valve mean gradient measures 5.9 mmHg. Aortic valve peak gradient measures 11.8 mmHg. Aortic valve area, by VTI measures 2.36 cm. Pulmonic Valve: The pulmonic valve was not well visualized. Pulmonic valve regurgitation is not visualized. No evidence of pulmonic stenosis. Aorta: The aortic root is normal in size and structure. Pulmonary Artery: Indeterminate PASP, inadequate TR jet. Venous: The inferior vena cava is normal in size with greater than 50% respiratory variability, suggesting right atrial pressure of 3 mmHg. IAS/Shunts: No atrial level shunt detected by color flow Doppler.  LEFT VENTRICLE PLAX 2D LVIDd:         5.09 cm  Diastology LVIDs:         3.85 cm  LV e' lateral:   3.59 cm/s LV PW:         1.10 cm  LV E/e' lateral: 17.9 LV IVS:        1.07 cm  LV e' medial:    5.44 cm/s LVOT diam:     1.90 cm  LV E/e' medial:  11.8 LV SV:         85 LV SV Index:   34 LVOT Area:     2.84 cm  RIGHT VENTRICLE RV S prime:     9.57 cm/s TAPSE (M-mode): 1.6 cm LEFT ATRIUM             Index       RIGHT ATRIUM           Index LA diam:        4.10 cm 1.65 cm/m  RA Area:     15.10 cm LA Vol (A2C):   37.2 ml 14.98 ml/m RA Volume:    39.90 ml  16.07 ml/m LA Vol (A4C):   49.9 ml 20.10 ml/m LA Biplane Vol: 44.6 ml 17.96 ml/m  AORTIC VALVE AV Area (Vmax):    2.46 cm AV Area (Vmean):   2.33 cm AV Area (VTI):     2.36 cm AV Vmax:           171.62 cm/s AV Vmean:          115.241 cm/s AV VTI:            0.361 m AV Peak Grad:      11.8 mmHg AV Mean Grad:      5.9 mmHg LVOT Vmax:         148.78 cm/s LVOT Vmean:        94.906 cm/s LVOT VTI:          0.301 m LVOT/AV VTI ratio: 0.83  AORTA Ao Root diam: 2.80 cm MITRAL VALVE MV Area (PHT): 2.48 cm    SHUNTS MV Decel Time: 306 msec    Systemic VTI:  0.30 m MV E velocity: 64.30 cm/s  Systemic Diam: 1.90 cm MV A velocity: 91.70 cm/s MV E/A ratio:  0.70 Carlyle Dolly MD Electronically signed by Carlyle Dolly MD Signature Date/Time: 06/18/2020/4:00:07 PM    Final    Disposition  Pt is being discharged home today in good condition.  Follow-up Plans & Appointments     Follow-up [facility] Follow up.   Specialty: Cardiology Why: cardiology scheduler will contact you to arrange follow up visit, please you do not hear from our scheduler in 3 business days, please give [place] a call  Contact information: [place] [phone]             Discharge Instructions    Diet - low sodium heart healthy   Complete by: As directed    Discharge instructions   Complete by: As directed    No lifting over 5 lbs for 1 week. No sexual activity for 1 week. Keep procedure site clean & dry. If you notice increased pain, swelling, bleeding or pus, call/return!  You may shower, but no soaking baths/hot tubs/pools for 1 week.   Increase activity slowly   Complete by: As directed       Discharge Medications   Allergies as of 06/19/2020      Reactions   Morphine And Related Nausea And Vomiting      Medication List    TAKE these medications   acetaminophen 650 MG CR tablet Commonly known as:  TYLENOL Take 650 mg by mouth every 8 (eight) hours as needed for pain.   albuterol 108 (90 Base) MCG/ACT inhaler Commonly known as: VENTOLIN HFA Inhale 2 puffs into the lungs every 6 (six) hours as needed for wheezing or shortness of breath (for rescue).   aspirin EC 325 MG tablet Take 325 mg by mouth every morning.   colestipol 1 g tablet Commonly known as: COLESTID Take 2 tablets (2 g total) by mouth 2 (two) times daily.   diclofenac sodium 1 % Gel Commonly known as: VOLTAREN Apply 4 g topically 4 (four) times daily.   furosemide 40 MG tablet Commonly known as: LASIX TAKE 1 TABLET BY MOUTH ONCE DAILY AS NEEDED FOR EDEMA OR  FLUID What changed: See the new instructions.   gabapentin 300 MG capsule Commonly known as: NEURONTIN TAKE 2 CAPSULES BY MOUTH IN THE MORNING AND 2 AT NOON AND 2 IN THE EVENING .(Needs to be seen before next refill)   glimepiride 2 MG tablet Commonly known as: AMARYL Take 1 tablet (2 mg total) by mouth daily with breakfast. (Needs to be seen before next refill)   metFORMIN 500 MG 24 hr tablet Commonly known as: Glucophage XR Take 2 tablets (1,000 mg total) by mouth 2 (two) times daily with a meal.   metoprolol tartrate 25 MG tablet Commonly known as: LOPRESSOR Take 1 tablet by mouth twice daily   nitroGLYCERIN 0.4 MG SL tablet Commonly known as: NITROSTAT See AUX Label   omeprazole 40 MG capsule Commonly known as: PRILOSEC TAKE 1 CAPSULE BY MOUTH ONCE DAILY 30 MINUTES BEFORE A MEAL (Needs to be seen before next refill)   OneTouch Delica Lancets 99B Misc Use to test blood sugar twice daily as directed; DX: Z16.96   OneTouch Delica Lancets 78L Misc Check blood sugar BID and prn  E11.9   OneTouch Verio test strip Generic drug: glucose blood Use to test blood sugar twice daily as directed; DX: E11.69   OneTouch Verio w/Device Kit Use to test blood sugar twice daily as directed; DX: E11.69   rosuvastatin 20 MG tablet Commonly known as:  CRESTOR Take 1 tablet (20 mg total) by mouth at bedtime. (  Needs to be seen before next refill)   telmisartan 40 MG tablet Commonly known as: Micardis Take 1 tablet (40 mg total) by mouth daily.   Trulicity 0.01 VC/9.4WH Sopn Generic drug: Dulaglutide Inject 0.75 mg into the skin once a week. What changed: additional instructions          Outstanding Labs/Studies   N/A  Duration of Discharge Encounter   Greater than 30 minutes including physician time.  Hilbert Corrigan, PA 06/19/2020, 6:25 PM

## 2020-06-19 NOTE — Progress Notes (Signed)
ANTICOAGULATION CONSULT NOTE  Pharmacy Consult for heparin Indication: chest pain/ACS  Allergies  Allergen Reactions  . Morphine And Related Nausea And Vomiting    Patient Measurements: Height: 5\' 2"  (157.5 cm) Weight: (!) 145.5 kg (320 lb 12.3 oz) IBW/kg (Calculated) : 50.1 Heparin Dosing Weight: 85 kg  Vital Signs: Temp: 97.8 F (36.6 C) (08/12 0000) Temp Source: Oral (08/12 0000) BP: 117/52 (08/12 0000) Pulse Rate: 75 (08/12 0000)  Labs: Recent Labs    06/18/20 1227 06/18/20 1347 06/18/20 1823 06/18/20 2336  HGB 12.7  --   --   --   HCT 39.3  --   --   --   PLT 179  --   --   --   HEPARINUNFRC  --   --   --  <0.10*  CREATININE 0.99  --   --   --   TROPONINIHS 3 3 2   --     Estimated Creatinine Clearance: 83.2 mL/min (by C-G formula based on SCr of 0.99 mg/dL).  Assessment: 59 y.o. female with chest pain for heparin   Goal of Therapy:  Heparin level 0.3-0.7 units/ml Monitor platelets by anticoagulation protocol: Yes   Plan:  Heparin 2000 units IV bolus, then increase heparin  1650 units/hr Check heparin level in 6 hours.   , PharmD, BCPS  06/19/2020 12:54 AM

## 2020-06-20 ENCOUNTER — Encounter (HOSPITAL_COMMUNITY): Payer: Self-pay | Admitting: Interventional Cardiology

## 2020-06-20 MED FILL — Verapamil HCl IV Soln 2.5 MG/ML: INTRAVENOUS | Qty: 2 | Status: AC

## 2020-06-26 ENCOUNTER — Telehealth: Payer: Self-pay | Admitting: Family Medicine

## 2020-06-26 MED ORDER — METFORMIN HCL ER 500 MG PO TB24: 1000.0000 mg | ORAL_TABLET | Freq: Every day | ORAL | 3 refills | Status: DC

## 2020-06-26 NOTE — Telephone Encounter (Signed)
Please review and advise.

## 2020-06-26 NOTE — Telephone Encounter (Signed)
Dose changed in chart

## 2020-06-26 NOTE — Telephone Encounter (Signed)
Yes, please change rx to reflect current use

## 2020-06-26 NOTE — Telephone Encounter (Signed)
Monica a [employer] with [facility] calling to state that the metformin is showing over a month past refill. Pt states she is only taking 2 daily instead of 4, pt gets side effects such as diarrhea when taking 4. Monica wanting to see if the rx can be changed to reflect this dosage.

## 2020-07-01 ENCOUNTER — Ambulatory Visit: Payer: PPO | Admitting: Internal Medicine

## 2020-07-01 NOTE — Progress Notes (Deleted)
[name], female    DOB: [birth year],    MRN: [id number]   Brief patient profile:  31 yowf active smoker with baseline wt around 200 last IUP 1986 and progressive wt gain since with doe but since  winter 2020 gradually much  worse to point of sob at rest so referred to pulmonary clinic 06/05/2019 by Dr  Nadine Counts no better on various laba/lamas    History of Present Illness  06/05/2019  Pulmonary/ 1st office eval/Kellar Westberg  Chief Complaint  Patient presents with  . Pulmonary Consult    Referred by Dr. Doylene Canard. Pt c/o DOE x 6 months. She states she can be SOB with or without any exertion. She uses her ventolin inhaler 3-4 x per wk on average.    Dyspnea:  At baseline could to at least an aisle at [employer] but now sob x 50 ft and limited also R leg back  Sob at rest with some strangling / choking  Cough: some at hs c/o dry throat  Sleep: on side with 10 pillows / was on ? cpap but off x one year x worse am ha/ ams/ hypersomnolence SABA use: helps some x sev hours last used 3 d prior to OV  Uses when goes / never nocturnal  rec Only use your albuterol as a rescue medication Work on inhaler technique:  relax and gently blow all the way out then take a nice smooth deep breath back in, triggering the inhaler at same time you start breathing in.  Hold for up to 5 seconds if you can.    Stop lisinopril (zestroil) and replace it with micardis 40 mg one daily  Change omeprazole (prilosec) to take Take 30-60 min before first meal of the day  Please remember to go to the lab department   for your tests - we will call you with the results when they are available. The key is to stop smoking completely before smoking completely stops you! - ? Repeat tsh/ pfts on return    07/01/2020  f/u ov/[place] office/Gael Delude re:  No chief complaint on file.    Dyspnea:  *** Cough: *** Sleeping: *** SABA use: *** 02: ***   No obvious day to day or daytime variability or assoc excess/ purulent  sputum or mucus plugs or hemoptysis or cp or chest tightness, subjective wheeze or overt sinus or hb symptoms.   *** without nocturnal  or early am exacerbation  of respiratory  c/o's or need for noct saba. Also denies any obvious fluctuation of symptoms with weather or environmental changes or other aggravating or alleviating factors except as outlined above   No unusual exposure hx or h/o childhood pna/ asthma or knowledge of premature birth.  Current Allergies, Complete Past Medical History, Past Surgical History, Family History, and Social History were reviewed in [employer] record.  ROS  The following are not active complaints unless bolded Hoarseness, sore throat, dysphagia, dental problems, itching, sneezing,  nasal congestion or discharge of excess mucus or purulent secretions, ear ache,   fever, chills, sweats, unintended wt loss or wt gain, classically pleuritic or exertional cp,  orthopnea pnd or arm/hand swelling  or leg swelling, presyncope, palpitations, abdominal pain, anorexia, nausea, vomiting, diarrhea  or change in bowel habits or change in bladder habits, change in stools or change in urine, dysuria, hematuria,  rash, arthralgias, visual complaints, headache, numbness, weakness or ataxia or problems with walking or coordination,  change in mood or  memory.  No outpatient medications have been marked as taking for the 07/01/20 encounter (Appointment) with Nyoka Cowden, MD.            Past Medical History:  Diagnosis Date  . Arthritis   . CAD (coronary artery disease)    BMS to circumflex 2007 - Dr. Juanda Chance  . CKD (chronic kidney disease) stage 3, GFR 30-59 ml/min (HCC)   . Essential hypertension   . Falls frequently   . GERD (gastroesophageal reflux disease)   . HA (headache)   . Hyperlipidemia   . Left-sided face pain   . Morbid obesity (HCC)   . Noncompliance   . OSA (obstructive sleep apnea)    Uses CPAP  . Type 2 diabetes  mellitus (HCC)   . Urinary incontinence      Objective:      Wt Readings from Last 3 Encounters:  06/19/20 (!) 320 lb 12.3 oz (145.5 kg)  03/03/20 (!) 334 lb 12.8 oz (151.9 kg)  08/31/19 (!) 324 lb 3.2 oz (147.1 kg)     Vital signs reviewed - Note on arrival 07/01/2020  02 sats  ***% on ***     Reports Two remaining bottom teeth      1+ ptting edema both LE's         Lab Results  Component Value Date   TSH 5.31 (H) 06/05/2019              Assessment

## 2020-07-04 ENCOUNTER — Ambulatory Visit: Payer: Medicare Other | Admitting: Family Medicine

## 2020-07-04 NOTE — Progress Notes (Deleted)
Cardiology Office Note  Date: 07/04/2020   ID: [name] 08-Mar-1961, MRN [id number]  PCP:  Janora Norlander, DO  Cardiologist:  Rozann Lesches, MD Electrophysiologist:  None   Chief Complaint: Hospital follow-up  History of Present Illness: [name] is a 59 y.o. female with a history of CAD (BMS to circumflex 2007), HTN, GERD, HLD, morbid obesity, OSA on CPAP, type II DM, CKD stage III.  Last visit with Dr. Domenic Polite 03/25/2018.  He was complaining of increasing episodes of discomfort in her chest.  Stated it did not feel like indigestion.  She was not taking nitroglycerin consistently.  She cannot identify any specific activity that brought the symptoms on.  Dr. Domenic Polite ordered a Lexiscan Myoview.  Stress test was considered low risk.  She continued on Crestor with most recent LDL at 38.  Her creatinine was 1.21.  She was remaining on Lasix for chronic lymphedema.  Chest pain 06/18/2020.  Sudden onset of chest pain while sitting in her kitchen around 10 AM.  Started in her left forearm and radiated up her arm to left shoulder and chest was associated diaphoresis and shortness of breath.  Mild improvement with nitroglycerin.  She subsequently had cardiac catheterization on 06/19/2020 demonstrating widely patent OM stent, codominant RCA with diffuse luminal irregularities, however no high-grade lesion.  Normal EF, LVEDP 18 mmHg.  She was deemed stable for discharge.  She was to follow-up in 1 to 2 weeks in the [place] office.  Past Medical History:  Diagnosis Date  . Arthritis   . CAD (coronary artery disease)    BMS to circumflex 2007 - Dr. Olevia Perches  . CKD (chronic kidney disease) stage 3, GFR 30-59 ml/min   . Essential hypertension   . Falls frequently   . GERD (gastroesophageal reflux disease)   . HA (headache)   . Hyperlipidemia   . Left-sided face pain   . Morbid obesity ([place])   . Noncompliance   . OSA (obstructive sleep apnea)    Uses CPAP  . Type 2 diabetes  mellitus ([place])   . Urinary incontinence     Past Surgical History:  Procedure Laterality Date  . ABDOMINAL HERNIA REPAIR    . ABDOMINAL HYSTERECTOMY    . BACK SURGERY    . BREAST SURGERY     biopsy per right breast; pt states has silver piece in for marking   . Cataract surgery Bilateral   . CHOLECYSTECTOMY    . COLONOSCOPY N/A 04/10/2013   Procedure: COLONOSCOPY;  Surgeon: Rogene Houston, MD;  [facility];  Service: Endoscopy;  Laterality: N/A;  . CORONARY ANGIOPLASTY WITH STENT PLACEMENT  2012  . LEFT HEART CATH AND CORONARY ANGIOGRAPHY N/A 06/19/2020   Procedure: LEFT HEART CATH AND CORONARY ANGIOGRAPHY;  Surgeon: Belva Crome, MD;  Location: [facility];  Service: Cardiovascular;  Laterality: N/A;  . LUMBAR LAMINECTOMY/DECOMPRESSION MICRODISCECTOMY N/A 04/25/2015   Procedure: CENTRAL LUMBAR /DECOMPRESSION L4-L5;  Surgeon: Latanya Maudlin, MD;  Location: [facility];  Service: Orthopedics;  Laterality: N/A;  . TUBAL LIGATION      Current Outpatient Medications  Medication Sig Dispense Refill  . acetaminophen (TYLENOL) 650 MG CR tablet Take 650 mg by mouth every 8 (eight) hours as needed for pain.    Marland Kitchen albuterol (VENTOLIN HFA) 108 (90 Base) MCG/ACT inhaler Inhale 2 puffs into the lungs every 6 (six) hours as needed for wheezing or shortness of breath (for rescue). 1 Inhaler 0  .  aspirin EC 325 MG tablet Take 325 mg by mouth every morning.     . Blood Glucose Monitoring Suppl (ONETOUCH VERIO) w/Device KIT Use to test blood sugar twice daily as directed; DX: E11.69 1 kit 1  . colestipol (COLESTID) 1 g tablet Take 2 tablets (2 g total) by mouth 2 (two) times daily. 120 tablet 4  . diclofenac sodium (VOLTAREN) 1 % GEL Apply 4 g topically 4 (four) times daily. 400 g 2  . Dulaglutide (TRULICITY) 1.61 WR/6.0AV SOPN Inject 0.75 mg into the skin once a week. (Patient taking differently: Inject 0.75 mg into the skin once a week. On Tuesdays) 4 pen 3  . furosemide (LASIX) 40 MG  tablet TAKE 1 TABLET BY MOUTH ONCE DAILY AS NEEDED FOR EDEMA OR  FLUID (Patient taking differently: Take 40 mg by mouth daily as needed. ) 90 tablet 0  . gabapentin (NEURONTIN) 300 MG capsule TAKE 2 CAPSULES BY MOUTH IN THE MORNING AND 2 AT NOON AND 2 IN THE EVENING .(Needs to be seen before next refill) 180 capsule 0  . glimepiride (AMARYL) 2 MG tablet Take 1 tablet (2 mg total) by mouth daily with breakfast. (Needs to be seen before next refill) 30 tablet 0  . glucose blood (ONETOUCH VERIO) test strip Use to test blood sugar twice daily as directed; DX: E11.69 100 each 5  . metFORMIN (GLUCOPHAGE XR) 500 MG 24 hr tablet Take 2 tablets (1,000 mg total) by mouth daily with breakfast. 60 tablet 3  . metoprolol tartrate (LOPRESSOR) 25 MG tablet Take 1 tablet by mouth twice daily 180 tablet 1  . nitroGLYCERIN (NITROSTAT) 0.4 MG SL tablet See AUX Label 12 tablet 0  . omeprazole (PRILOSEC) 40 MG capsule TAKE 1 CAPSULE BY MOUTH ONCE DAILY 30 MINUTES BEFORE A MEAL (Needs to be seen before next refill) 30 capsule 0  . OneTouch Delica Lancets 40J MISC Use to test blood sugar twice daily as directed; DX: E11.69 100 each 3  . OneTouch Delica Lancets 81X MISC Check blood sugar BID and prn  E11.9 100 each 11  . rosuvastatin (CRESTOR) 20 MG tablet Take 1 tablet (20 mg total) by mouth at bedtime. (Needs to be seen before next refill) 30 tablet 0  . telmisartan (MICARDIS) 40 MG tablet Take 1 tablet (40 mg total) by mouth daily. 30 tablet 0   No current facility-administered medications for this visit.   Allergies:  Morphine and related   Social History: The patient  reports that she has been smoking cigarettes. She started smoking about 48 years ago. She has a 47.00 pack-year smoking history. She has never used smokeless tobacco. She reports that she does not drink alcohol and does not use drugs.   Family History: The patient's family history includes Arthritis in her sister; Asthma in her sister; Breast cancer in  her mother; Cancer - Colon in her father; Congenital heart disease in her son; Coronary artery disease in her father; Diabetes in her father; Hiatal hernia in her son; High Cholesterol in her daughter and father; Thyroid disease in her daughter.   ROS:  Please see the history of present illness. Otherwise, complete review of systems is positive for {NONE DEFAULTED:18576::"none"}.  All other systems are reviewed and negative.   Physical Exam: VS:  There were no vitals taken for this visit., BMI There is no height or weight on file to calculate BMI.  Wt Readings from Last 3 Encounters:  06/19/20 (!) 320 lb 12.3 oz (145.5 kg)  03/03/20 (!) 334 lb 12.8 oz (151.9 kg)  08/31/19 (!) 324 lb 3.2 oz (147.1 kg)    General: Patient appears comfortable at rest. HEENT: Conjunctiva and lids normal, oropharynx clear with moist mucosa. Neck: Supple, no elevated JVP or carotid bruits, no thyromegaly. Lungs: Clear to auscultation, nonlabored breathing at rest. Cardiac: Regular rate and rhythm, no S3 or significant systolic murmur, no pericardial rub. Abdomen: Soft, nontender, no hepatomegaly, bowel sounds present, no guarding or rebound. Extremities: No pitting edema, distal pulses 2+. Skin: Warm and dry. Musculoskeletal: No kyphosis. Neuropsychiatric: Alert and oriented x3, affect grossly appropriate.  ECG:  {EKG/Telemetry Strips Reviewed:[phone]}  Recent Labwork: 06/18/2020: ALT 16; AST 17; B Natriuretic Peptide 56.0 06/19/2020: BUN 7; Creatinine, Ser 1.16; Hemoglobin 11.6; Magnesium 2.0; Platelets 164; Potassium 3.7; Sodium 143     Component Value Date/Time   CHOL 105 03/03/2020 1051   TRIG 161 (H) 03/03/2020 1051   TRIG 211 (H) 12/30/2014 1537   HDL 37 (L) 03/03/2020 1051   HDL 48 12/30/2014 1537   CHOLHDL 2.8 03/03/2020 1051   CHOLHDL 4.5 04/09/2013 1135   VLDL 37 04/09/2013 1135   LDLCALC 41 03/03/2020 1051   LDLCALC 42 08/28/2014 1037    Other Studies Reviewed Today:  Cardiac  catheterization 06/19/2020   2nd Mrg lesion is 30% stenosed.    Codominant right coronary with moderate diffuse atherosclerosis.  No high-grade obstructive disease.  Widely patent left main.  LAD gives origin to one large diagonal.  Distal to the bifurcation with the diagonal the LAD contains eccentric 40 to 50% narrowing within a region of calcification.  The ostium of the diagonal is 25% narrowed.  No high-grade focal stenosis is noted in the LAD or diagonal territory.  Circumflex is codominant.  The obtuse marginal in the large second diagonal contains diffuse in-stent restenosis which narrows the lumen by 30%.  No high-grade obstruction is noted in the circumflex territory.  Normal left ventricular function with upper normal LV EDP of 18 mmHg.  EF 60% or greater.  RECOMMENDATIONS:   Continued patency of the bare-metal stent in the obtuse marginal.  No high-grade obstructive disease that can be implicated as a source/cause of the patient's presenting symptoms.\  Further management and work-up as directed by the primary team.  Diagnostic Dominance: Co-dominant  Echocardiogram 06/18/2020 1. Left ventricular ejection fraction, by estimation, is 50 to 55%. The left ventricle has normal function. The left ventricle has no regional wall motion abnormalities. There is mild left ventricular hypertrophy. Left ventricular diastolic parameters are consistent with Grade I diastolic dysfunction (impaired relaxation). Elevated left atrial pressure. 2. Right ventricular systolic function is normal. The right ventricular size is normal. 3. The mitral valve is normal in structure. Mild mitral valve regurgitation. No evidence of mitral stenosis. 4. The aortic valve has an indeterminant number of cusps. Aortic valve regurgitation is not visualized. No aortic stenosis is present. 5. The inferior vena cava is normal in size with greater than 50% respiratory variability, suggesting right atrial  pressure of 3 mmHg   Lexiscan Myoview 04/04/2018  No diagnostic ST segment changes to indicate ischemia.  Small, mild intensity, mid to apical anterior defect that is more prominent at rest and consistent with soft tissue attenuation. No clear ischemic territories.  This is a low risk study.  Nuclear stress EF: 68%   Lexiscan Cardiolite 5/44/2016:  There was no ST segment deviation noted during stress.  Defect 1: There is a medium defect of moderate severity present in the  mid anterior and apical anterior location.  This is a low risk study.  Fixed anterior defect as outlined, most consistent with breast attenuation. LVEF 72%.   Assessment and Plan:  1. Chest pain, unspecified type   2. CAD in native artery   3. Mixed hyperlipidemia   4. Stage 3 chronic kidney disease, unspecified whether stage 3a or 3b CKD   5. Chronic acquired lymphedema      Medication Adjustments/Labs and Tests Ordered: Current medicines are reviewed at length with the patient today.  Concerns regarding medicines are outlined above.   Disposition: Follow-up with ***  Signed, Levell July, NP 07/04/2020 12:48 AM    [facility] at [facility] [place], [place], [place] [address] Phone: [phone]; Fax: [phone]

## 2020-07-15 NOTE — Progress Notes (Deleted)
Cardiology Office Note  Date: 07/15/2020   ID: [name], DOB [birth year], MRN [id number]  PCP:  Stacey Norlander, DO  Cardiologist:  Stacey Lesches, MD Electrophysiologist:  None   Chief Complaint: Hospital follow-up  History of Present Illness: [name] is a 59 y.o. female with a history of CAD (BMS to circumflex 2007), HTN, GERD, HLD, morbid obesity, OSA on CPAP, type II DM, CKD stage III.  Last visit with [name] 03/25/2018.  She was complaining of increasing episodes of discomfort in her chest.  Stated it did not feel like indigestion.  She was not taking nitroglycerin consistently.  She cannot identify any specific activity that brought the symptoms on.  [name] ordered a Lexiscan Myoview.  Stress test was considered low risk.  She continued on Crestor with most recent LDL at 38.  Her creatinine was 1.21.  She was remaining on Lasix for chronic lymphedema.  Chest pain 06/18/2020.  Sudden onset of chest pain while sitting in her kitchen around 10 AM.  Started in her left forearm and radiated up her arm to left shoulder and chest was associated diaphoresis and shortness of breath.  Mild improvement with nitroglycerin.  She subsequently had cardiac catheterization on 06/19/2020 demonstrating widely patent OM stent, codominant RCA with diffuse luminal irregularities, however no high-grade lesion.  Normal EF, LVEDP 18 mmHg.  She was deemed stable for discharge.  She was to follow-up in 1 to 2 weeks in the [place] office.  Past Medical History:  Diagnosis Date  . Arthritis   . CAD (coronary artery disease)    BMS to circumflex 2007 - Dr. Olevia [name]  . CKD (chronic kidney disease) stage 3, GFR 30-59 ml/min   . Essential hypertension   . Falls frequently   . GERD (gastroesophageal reflux disease)   . HA (headache)   . Hyperlipidemia   . Left-sided face pain   . Morbid obesity ([place])   . Noncompliance   . OSA (obstructive sleep apnea)    Uses CPAP  . Type 2 diabetes  mellitus ([place])   . Urinary incontinence     Past Surgical History:  Procedure Laterality Date  . ABDOMINAL HERNIA REPAIR    . ABDOMINAL HYSTERECTOMY    . BACK SURGERY    . BREAST SURGERY     biopsy per right breast; pt states has silver piece in for marking   . Cataract surgery Bilateral   . CHOLECYSTECTOMY    . COLONOSCOPY N/A 04/10/2013   Procedure: COLONOSCOPY;  Surgeon: Stacey Houston, MD;  [facility];  Service: Endoscopy;  Laterality: N/A;  . CORONARY ANGIOPLASTY WITH STENT PLACEMENT  2012  . LEFT HEART CATH AND CORONARY ANGIOGRAPHY N/A 06/19/2020   Procedure: LEFT HEART CATH AND CORONARY ANGIOGRAPHY;  Surgeon: Stacey Crome, MD;  Location: [place] [facility];  Service: Cardiovascular;  Laterality: N/A;  . LUMBAR LAMINECTOMY/DECOMPRESSION MICRODISCECTOMY N/A 04/25/2015   Procedure: CENTRAL LUMBAR /DECOMPRESSION L4-L5;  Surgeon: Stacey Maudlin, MD;  [facility]: [facility];  Service: Orthopedics;  Laterality: N/A;  . TUBAL LIGATION      Current Outpatient Medications  Medication Sig Dispense Refill  . acetaminophen (TYLENOL) 650 MG CR tablet Take 650 mg by mouth every 8 (eight) hours as needed for pain.    Marland Kitchen albuterol (VENTOLIN HFA) 108 (90 Base) MCG/ACT inhaler Inhale 2 puffs into the lungs every 6 (six) hours as needed for wheezing or shortness of breath (for rescue). 1 Inhaler 0  .  aspirin EC 325 MG tablet Take 325 mg by mouth every morning.     . Blood Glucose Monitoring Suppl (ONETOUCH VERIO) w/Device KIT Use to test blood sugar twice daily as directed; DX: E11.69 1 kit 1  . colestipol (COLESTID) 1 g tablet Take 2 tablets (2 g total) by mouth 2 (two) times daily. 120 tablet 4  . diclofenac sodium (VOLTAREN) 1 % GEL Apply 4 g topically 4 (four) times daily. 400 g 2  . Dulaglutide (TRULICITY) 7.49 SW/9.6PR SOPN Inject 0.75 mg into the skin once a week. (Patient taking differently: Inject 0.75 mg into the skin once a week. On Tuesdays) 4 pen 3  . furosemide (LASIX) 40 MG  tablet TAKE 1 TABLET BY MOUTH ONCE DAILY AS NEEDED FOR EDEMA OR  FLUID (Patient taking differently: Take 40 mg by mouth daily as needed. ) 90 tablet 0  . gabapentin (NEURONTIN) 300 MG capsule TAKE 2 CAPSULES BY MOUTH IN THE MORNING AND 2 AT NOON AND 2 IN THE EVENING .(Needs to be seen before next refill) 180 capsule 0  . glimepiride (AMARYL) 2 MG tablet Take 1 tablet (2 mg total) by mouth daily with breakfast. (Needs to be seen before next refill) 30 tablet 0  . glucose blood (ONETOUCH VERIO) test strip Use to test blood sugar twice daily as directed; DX: E11.69 100 each 5  . metFORMIN (GLUCOPHAGE XR) 500 MG 24 hr tablet Take 2 tablets (1,000 mg total) by mouth daily with breakfast. 60 tablet 3  . metoprolol tartrate (LOPRESSOR) 25 MG tablet Take 1 tablet by mouth twice daily 180 tablet 1  . nitroGLYCERIN (NITROSTAT) 0.4 MG SL tablet See AUX Label 12 tablet 0  . omeprazole (PRILOSEC) 40 MG capsule TAKE 1 CAPSULE BY MOUTH ONCE DAILY 30 MINUTES BEFORE A MEAL (Needs to be seen before next refill) 30 capsule 0  . OneTouch Delica Lancets 91M MISC Use to test blood sugar twice daily as directed; DX: E11.69 100 each 3  . OneTouch Delica Lancets 38G MISC Check blood sugar BID and prn  E11.9 100 each 11  . rosuvastatin (CRESTOR) 20 MG tablet Take 1 tablet (20 mg total) by mouth at bedtime. (Needs to be seen before next refill) 30 tablet 0  . telmisartan (MICARDIS) 40 MG tablet Take 1 tablet (40 mg total) by mouth daily. 30 tablet 0   No current facility-administered medications for this visit.   Allergies:  Morphine and related   Social History: The patient  reports that she has been smoking cigarettes. She started smoking about 48 years ago. She has a 47.00 pack-year smoking history. She has never used smokeless tobacco. She reports that she does not drink alcohol and does not use drugs.   Family History: The patient's family history includes Arthritis in her sister; Asthma in her sister; Breast cancer in  her mother; Cancer - Colon in her father; Congenital heart disease in her son; Coronary artery disease in her father; Diabetes in her father; Hiatal hernia in her son; High Cholesterol in her daughter and father; Thyroid disease in her daughter.   ROS:  Please see the history of present illness. Otherwise, complete review of systems is positive for {NONE DEFAULTED:18576::"none"}.  All other systems are reviewed and negative.   Physical Exam: VS:  There were no vitals taken for this visit., BMI There is no height or weight on file to calculate BMI.  Wt Readings from Last 3 Encounters:  06/19/20 (!) 320 lb 12.3 oz (145.5 kg)  03/03/20 (!) 334 lb 12.8 oz (151.9 kg)  08/31/19 (!) 324 lb 3.2 oz (147.1 kg)    General: Patient appears comfortable at rest. HEENT: Conjunctiva and lids normal, oropharynx clear with moist mucosa. Neck: Supple, no elevated JVP or carotid bruits, no thyromegaly. Lungs: Clear to auscultation, nonlabored breathing at rest. Cardiac: Regular rate and rhythm, no S3 or significant systolic murmur, no pericardial rub. Abdomen: Soft, nontender, no hepatomegaly, bowel sounds present, no guarding or rebound. Extremities: No pitting edema, distal pulses 2+. Skin: Warm and dry. Musculoskeletal: No kyphosis. Neuropsychiatric: Alert and oriented x3, affect grossly appropriate.  ECG:  {EKG/Telemetry Strips [phone]}  Recent Labwork: 06/18/2020: ALT 16; AST 17; B Natriuretic Peptide 56.0 06/19/2020: BUN 7; Creatinine, Ser 1.16; Hemoglobin 11.6; Magnesium 2.0; Platelets 164; Potassium 3.7; Sodium 143     Component Value Date/Time   CHOL 105 03/03/2020 1051   TRIG 161 (H) 03/03/2020 1051   TRIG 211 (H) 12/30/2014 1537   HDL 37 (L) 03/03/2020 1051   HDL 48 12/30/2014 1537   CHOLHDL 2.8 03/03/2020 1051   CHOLHDL 4.5 04/09/2013 1135   VLDL 37 04/09/2013 1135   LDLCALC 41 03/03/2020 1051   LDLCALC 42 08/28/2014 1037    Other Studies Reviewed Today:  Cardiac  catheterization 06/19/2020   2nd Mrg lesion is 30% stenosed.    Codominant right coronary with moderate diffuse atherosclerosis.  No high-grade obstructive disease.  Widely patent left main.  LAD gives origin to one large diagonal.  Distal to the bifurcation with the diagonal the LAD contains eccentric 40 to 50% narrowing within a region of calcification.  The ostium of the diagonal is 25% narrowed.  No high-grade focal stenosis is noted in the LAD or diagonal territory.  Circumflex is codominant.  The obtuse marginal in the large second diagonal contains diffuse in-stent restenosis which narrows the lumen by 30%.  No high-grade obstruction is noted in the circumflex territory.  Normal left ventricular function with upper normal LV EDP of 18 mmHg.  EF 60% or greater.  RECOMMENDATIONS:   Continued patency of the bare-metal stent in the obtuse marginal.  No high-grade obstructive disease that can be implicated as a source/cause of the patient's presenting symptoms.\  Further management and work-up as directed by the primary team.  Diagnostic Dominance: Co-dominant  Echocardiogram 06/18/2020 1. Left ventricular ejection fraction, by estimation, is 50 to 55%. The left ventricle has normal function. The left ventricle has no regional wall motion abnormalities. There is mild left ventricular hypertrophy. Left ventricular diastolic parameters are consistent with Grade I diastolic dysfunction (impaired relaxation). Elevated left atrial pressure. 2. Right ventricular systolic function is normal. The right ventricular size is normal. 3. The mitral valve is normal in structure. Mild mitral valve regurgitation. No evidence of mitral stenosis. 4. The aortic valve has an indeterminant number of cusps. Aortic valve regurgitation is not visualized. No aortic stenosis is present. 5. The inferior vena cava is normal in size with greater than 50% respiratory variability, suggesting right atrial  pressure of 3 mmHg   Lexiscan Myoview 04/04/2018  No diagnostic ST segment changes to indicate ischemia.  Small, mild intensity, mid to apical anterior defect that is more prominent at rest and consistent with soft tissue attenuation. No clear ischemic territories.  This is a low risk study.  Nuclear stress EF: 68%   Lexiscan Cardiolite 5/44/2016:  There was no ST segment deviation noted during stress.  Defect 1: There is a medium defect of moderate severity present in the  mid anterior and apical anterior location.  This is a low risk study.  Fixed anterior defect as outlined, most consistent with breast attenuation. LVEF 72%.   Assessment and Plan:  1. CAD in native artery   2. Hypertension associated with diabetes ([place])   3. OSA (obstructive sleep apnea)   4. Unstable angina ([place])   5. Cigarette smoker      Medication Adjustments/Labs and Tests Ordered: Current medicines are reviewed at length with the patient today.  Concerns regarding medicines are outlined above.   Disposition: Follow-up with ***  Signed, Levell July, NP 07/15/2020 9:21 PM    [facility] at [facility] [place], [place], [place] [address] Phone: [phone]; Fax: [phone]

## 2020-07-16 ENCOUNTER — Ambulatory Visit: Payer: Medicare Other | Admitting: Family Medicine

## 2020-07-18 ENCOUNTER — Telehealth: Payer: Self-pay | Admitting: Family Medicine

## 2020-07-18 NOTE — Telephone Encounter (Signed)
Patient states for the last 3 months she thinks she has been having a side effect from the Trulicity.  States that she has been having abdominal pain, cramps and diarrhea.  She took her shot on Tuesday and states that Wednesday morning she woke up with body chills and a fever of 101.  She has not had a fever since then.  She is still having the continued abdominal pain, cramps and diarrhea.  Wanting to stop the medication.  Please advise.

## 2020-07-19 NOTE — Telephone Encounter (Signed)
Fever is NOT a known side effect of this medication.  Would look for other etiology.  However, abdominal pain and cramping can be.  Ok to switch to alternative.  Will cc to [place].  Pools, please schedule pt a visit with Raynelle Fanning when she returns next week for discussion of alternatives.  If patient continues to have fevers, abdominal pain, needs to be seen.

## 2020-07-20 ENCOUNTER — Other Ambulatory Visit: Payer: Self-pay | Admitting: Internal Medicine

## 2020-07-20 ENCOUNTER — Other Ambulatory Visit: Payer: Self-pay | Admitting: Family Medicine

## 2020-07-20 DIAGNOSIS — K219 Gastro-esophageal reflux disease without esophagitis: Secondary | ICD-10-CM

## 2020-07-21 ENCOUNTER — Telehealth: Payer: Self-pay | Admitting: Family Medicine

## 2020-07-21 NOTE — Telephone Encounter (Signed)
Refer to original call

## 2020-07-21 NOTE — Telephone Encounter (Signed)
Patient aware and verbalized understanding. °

## 2020-07-25 ENCOUNTER — Other Ambulatory Visit: Payer: Self-pay

## 2020-07-25 ENCOUNTER — Ambulatory Visit (INDEPENDENT_AMBULATORY_CARE_PROVIDER_SITE_OTHER): Payer: Medicare Other | Admitting: Pharmacist

## 2020-07-25 DIAGNOSIS — E119 Type 2 diabetes mellitus without complications: Secondary | ICD-10-CM

## 2020-07-25 NOTE — Progress Notes (Signed)
    07/25/2020 Name: [name] MRN: [id number] DOB: 1961-06-04   S:  S:58 yoFPresents for diabetes evaluation, education, and management. Patient was referred and last seen by Primary Care Provider on4/26/21.  Patient reports difficulty taking GLP1 (Trulicity) due to GI intolerances. Insurance coverage/medication affordability:Tier 4 LIS/extra help- [employer] medicare  Patientreportsadherence with medications.  Current diabetes medications include:metformin, glimepiride, trulicity   Current hypertension medications include:metoprolol, telmisartan  Current hyperlipidemia medications include:rosuvastatin  Patientdenieshypoglycemic events.  Patient-reported exercise habits: n/a  Patientreportsnocturia (nighttime urination).  Patientreportsneuropathy (nerve pain).  Patientdeniesvisual changes.  Patientreportsself foot exams.  Patient-reported exercise habits: n/a  DIET  Patient reported dietary habits: Eats 3 meals/day The patient is asked to make an attempt to improve diet and exercise patterns to aid in medical management of this problem.  Discussed meal planning options and Plate method for healthy eating . Avoid sugary drinks and desserts . Incorporate balanced protein, non starchy veggies, 1 serving of carbohydrate with each meal . Increase water intake . Increase physical activity as able  O:  Lab Results  Component Value Date   HGBA1C 8.4 (H) 06/18/2020    There were no vitals filed for this visit.     Lipid Panel     Component Value Date/Time   CHOL 105 03/03/2020 1051   TRIG 161 (H) 03/03/2020 1051   TRIG 211 (H) 12/30/2014 1537   HDL 37 (L) 03/03/2020 1051   HDL 48 12/30/2014 1537   CHOLHDL 2.8 03/03/2020 1051   CHOLHDL 4.5 04/09/2013 1135   VLDL 37 04/09/2013 1135   LDLCALC 41 03/03/2020 1051   LDLCALC 42 08/28/2014 1037     Home fasting blood sugars: 165, 179  2 hour post-meal/random blood sugars: 200s.    A/P: DiabetesT2DMcurrently uncontrolled. Patient is able to verbalize appropriate hypoglycemia management plan. Patientisadherent with medication. Control is suboptimal due to cost of medication/diet.  -Change metformin to metformin XR to reduce GI distress--pt admits to only taking 1 tablet twice daily vs 2 tablets twice daily.  She cannot tolerate.  -START FARXIGA 10MG daily (samples given ZLD#[id number], EXP 2/24)  Counseled Sick day rules: if sick, vomiting, have diarrhea, or  cannot drink enough fluids, you should stop taking SGLT-2 inhibitors until your symptoms go away. In rare cases, these medicines can cause diabetic ketoacidosis (DKA). DKA is acid buildup in the blood.  Will obtain medication via patient assistance  -Continue glimepiride  -Will decrease trulicity to 7.93JQ due to GI distress  -resend paperwork to [employer] patient assistance company  -One touch verio glucometer called in to [facility]--they have to order.  Show patient the meter and how to use (called in kit, test strips and lancets) -Extensively discussed pathophysiology of diabetes, recommended lifestyle interventions, dietary effects on blood sugar control  -Counseled on s/sx of and management of hypoglycemia  -Next A1C anticipated 08/08/20.  Written patient instructions provided.  Total time in face to face counseling 28 minutes.   Follow up PCP Clinic Visit ON 08/08/20.    Regina Eck, PharmD, BCPS Clinical Pharmacist, [place]  [facility] Phone [phone]

## 2020-08-03 ENCOUNTER — Other Ambulatory Visit: Payer: Self-pay | Admitting: Family Medicine

## 2020-08-03 DIAGNOSIS — E1142 Type 2 diabetes mellitus with diabetic polyneuropathy: Secondary | ICD-10-CM

## 2020-08-04 NOTE — Progress Notes (Signed)
Cardiology Office Note  Date: 08/05/2020   ID: Stacey, [name] 08-12-1961, MRN [id number]  PCP:  Stacey Norlander, DO  Cardiologist:  Rozann Lesches, MD Electrophysiologist:  None   Chief Complaint: Hospital follow-up  History of Present Illness: [name] is a 59 y.o. female with a history of CAD (BMS to circumflex 2007), HTN, GERD, HLD, morbid obesity, OSA on CPAP, type II DM, CKD stage III.  Last visit with Dr. Domenic Polite 03/25/2018.  She was complaining of increasing episodes of discomfort in her chest.  Stated it did not feel like indigestion.  She was not taking nitroglycerin consistently.  She cannot identify any specific activity that brought the symptoms on.  Dr. Domenic Polite ordered a Lexiscan Myoview.  Stress test was considered low risk.  She continued on Crestor with most recent LDL at 38.  Her creatinine was 1.21.  She was remaining on Lasix for chronic lymphedema.  Chest pain 06/18/2020.  Sudden onset of chest pain while sitting in her kitchen around 10 AM.  Started in her left forearm and radiated up her arm to left shoulder and chest was associated diaphoresis and shortness of breath.  Mild improvement with nitroglycerin.  She was admitted and  subsequently had cardiac catheterization on 06/19/2020 demonstrating widely patent OM stent, codominant RCA with diffuse luminal irregularities, however no high-grade lesion.  Normal EF, LVEDP 18 mmHg.  She was deemed stable for discharge.  She was to follow-up in 1 to 2 weeks in the [place] office.  Patient is here status post hospital follow-up for chest pain and subsequent cardiac catheterization.  We reviewed his ED cardiac catheterization results.  States she still has some minor chest pain but believes it is related to significant stress and anxiety due to her father being in hospice and is slowly dying per her statement today.  She denies any significant recent anginal or exertional symptoms, palpitations or arrhythmias, orthostatic  symptoms, CVA or TIA-like symptoms.  States her weight has recently increased..  She has lymphedema in both lower extremities.  She states both lower extremities appear to be larger than usual.  She takes as needed Lasix for edema or fluid accumulation.  Blood pressure was initially elevated on arrival at 160/78.  Recheck in left arm was 139/81.  She states her bottle of nitroglycerin sublingual is old and out of date and needs a new refill.  She is also taking aspirin 325 mg p.o. daily.  She states has been on the regular dose aspirin since she had her initial stent placed.  Advised her the guidelines state 81 mg of aspirin is adequate for patient's with CAD.  States she has been having some issues with blood sugar control.  Recent blood sugars have been anywhere from 178-239.  Past Medical History:  Diagnosis Date  . Arthritis   . CAD (coronary artery disease)    BMS to circumflex 2007 - Dr. Olevia Perches  . CKD (chronic kidney disease) stage 3, GFR 30-59 ml/min   . Essential hypertension   . Falls frequently   . GERD (gastroesophageal reflux disease)   . HA (headache)   . Hyperlipidemia   . Left-sided face pain   . Morbid obesity ([place])   . Noncompliance   . OSA (obstructive sleep apnea)    Uses CPAP  . Type 2 diabetes mellitus ([place])   . Urinary incontinence     Past Surgical History:  Procedure Laterality Date  . ABDOMINAL HERNIA REPAIR    .  ABDOMINAL HYSTERECTOMY    . BACK SURGERY    . BREAST SURGERY     biopsy per right breast; pt states has silver piece in for marking   . Cataract surgery Bilateral   . CHOLECYSTECTOMY    . COLONOSCOPY N/A 04/10/2013   Procedure: COLONOSCOPY;  Surgeon: Rogene Houston, MD;  Location: [facility];  Service: Endoscopy;  Laterality: N/A;  . CORONARY ANGIOPLASTY WITH STENT PLACEMENT  2012  . LEFT HEART CATH AND CORONARY ANGIOGRAPHY N/A 06/19/2020   Procedure: LEFT HEART CATH AND CORONARY ANGIOGRAPHY;  Surgeon: Belva Crome, MD;  Location: [facility] [facility];  Service: Cardiovascular;  Laterality: N/A;  . LUMBAR LAMINECTOMY/DECOMPRESSION MICRODISCECTOMY N/A 04/25/2015   Procedure: CENTRAL LUMBAR /DECOMPRESSION L4-L5;  Surgeon: Latanya Maudlin, MD;  Location: [facility];  Service: Orthopedics;  Laterality: N/A;  . TUBAL LIGATION      Current Outpatient Medications  Medication Sig Dispense Refill  . acetaminophen (TYLENOL) 650 MG CR tablet Take 650 mg by mouth every 8 (eight) hours as needed for pain.    Marland Kitchen albuterol (VENTOLIN HFA) 108 (90 Base) MCG/ACT inhaler Inhale 2 puffs into the lungs every 6 (six) hours as needed for wheezing or shortness of breath (for rescue). 1 Inhaler 0  . Blood Glucose Monitoring Suppl (ONETOUCH VERIO) w/Device KIT Use to test blood sugar twice daily as directed; DX: E11.69 1 kit 1  . colestipol (COLESTID) 1 g tablet Take 2 tablets (2 g total) by mouth 2 (two) times daily. 120 tablet 4  . diclofenac sodium (VOLTAREN) 1 % GEL Apply 4 g topically 4 (four) times daily. 400 g 2  . Dulaglutide (TRULICITY) 4.16 SA/6.3KZ SOPN Inject 0.75 mg into the skin once a week. (Patient taking differently: Inject 0.75 mg into the skin once a week. On Tuesdays) 4 pen 3  . furosemide (LASIX) 40 MG tablet TAKE 1 TABLET BY MOUTH ONCE DAILY AS NEEDED FOR EDEMA OR  FLUID (Patient taking differently: Take 40 mg by mouth 2 (two) times daily. ) 90 tablet 0  . gabapentin (NEURONTIN) 300 MG capsule TAKE 2 CAPSULES BY MOUTH IN THE MORNING AND 2 AT NOON AND 2 IN THE EVENING . 180 capsule 0  . glimepiride (AMARYL) 2 MG tablet Take 1 tablet (2 mg total) by mouth daily with breakfast. (Needs to be seen before next refill) 30 tablet 0  . glucose blood (ONETOUCH VERIO) test strip Use to test blood sugar twice daily as directed; DX: E11.69 100 each 5  . metFORMIN (GLUCOPHAGE XR) 500 MG 24 hr tablet Take 2 tablets (1,000 mg total) by mouth daily with breakfast. 60 tablet 3  . metoprolol tartrate (LOPRESSOR) 25 MG tablet Take 1 tablet by mouth twice daily 180 tablet  1  . nitroGLYCERIN (NITROSTAT) 0.4 MG SL tablet Place 1 tablet (0.4 mg total) under the tongue every 5 (five) minutes as needed for chest pain. 25 tablet 3  . omeprazole (PRILOSEC) 40 MG capsule TAKE 1 CAPSULE BY MOUTH 30 MINUTES ONCE DAILY BEFORE MEAL(S) 30 capsule 0  . OneTouch Delica Lancets 60F MISC Use to test blood sugar twice daily as directed; DX: E11.69 100 each 3  . OneTouch Delica Lancets 09N MISC Check blood sugar BID and prn  E11.9 100 each 11  . rosuvastatin (CRESTOR) 20 MG tablet Take 1 tablet (20 mg total) by mouth at bedtime. (Needs to be seen before next refill) 30 tablet 0  . telmisartan (MICARDIS) 40 MG tablet Take 1 tablet (40  mg total) by mouth daily. 30 tablet 0  . aspirin EC 81 MG tablet Take 1 tablet (81 mg total) by mouth daily.     No current facility-administered medications for this visit.   Allergies:  Morphine and related   Social History: The patient  reports that she has been smoking cigarettes. She started smoking about 48 years ago. She has a 47.00 pack-year smoking history. She has never used smokeless tobacco. She reports that she does not drink alcohol and does not use drugs.   Family History: The patient's family history includes Arthritis in her sister; Asthma in her sister; Breast cancer in her mother; Cancer - Colon in her father; Congenital heart disease in her son; Coronary artery disease in her father; Diabetes in her father; Hiatal hernia in her son; High Cholesterol in her daughter and father; Thyroid disease in her daughter.   ROS:  Please see the history of present illness. Otherwise, complete review of systems is positive for none.  All other systems are reviewed and negative.   Physical Exam: VS:  BP (!) 160/78   Pulse 66   Ht _0  (1.575 m)   Wt (!) 319 lb (144.7 kg)   SpO2 97%   BMI 58.35 kg/m , BMI Body mass index is 58.35 kg/m.  Wt Readings from Last 3 Encounters:  08/05/20 (!) 319 lb (144.7 kg)  06/19/20 (!) 320 lb 12.3 oz (145.5  kg)  03/03/20 (!) 334 lb 12.8 oz (151.9 kg)    General: Morbidly obese patient appears comfortable at rest. Neck: Supple, no elevated JVP or carotid bruits, no thyromegaly. Lungs: Clear to auscultation, nonlabored breathing at rest. Cardiac: Regular rate and rhythm, no S3 or significant systolic murmur, no pericardial rub. Extremities: Bilateral lymphedema noted.  No pitting edema, distal pulses 2+. Skin: Warm and dry. Musculoskeletal: No kyphosis. Neuropsychiatric: Alert and oriented x3, affect grossly appropriate.  ECG:  EKG 06/18/2020 showed normal sinus rhythm rate of 79, RBBB, cannot rule out inferior infarct.  Recent Labwork: 06/18/2020: ALT 16; AST 17; B Natriuretic Peptide 56.0 06/19/2020: BUN 7; Creatinine, Ser 1.16; Hemoglobin 11.6; Magnesium 2.0; Platelets 164; Potassium 3.7; Sodium 143     Component Value Date/Time   CHOL 105 03/03/2020 1051   TRIG 161 (H) 03/03/2020 1051   TRIG 211 (H) 12/30/2014 1537   HDL 37 (L) 03/03/2020 1051   HDL 48 12/30/2014 1537   CHOLHDL 2.8 03/03/2020 1051   CHOLHDL 4.5 04/09/2013 1135   VLDL 37 04/09/2013 1135   LDLCALC 41 03/03/2020 1051   LDLCALC 42 08/28/2014 1037    Other Studies Reviewed Today:  Cardiac catheterization 06/19/2020   2nd Mrg lesion is 30% stenosed.    Codominant right coronary with moderate diffuse atherosclerosis.  No high-grade obstructive disease.  Widely patent left main.  LAD gives origin to one large diagonal.  Distal to the bifurcation with the diagonal the LAD contains eccentric 40 to 50% narrowing within a region of calcification.  The ostium of the diagonal is 25% narrowed.  No high-grade focal stenosis is noted in the LAD or diagonal territory.  Circumflex is codominant.  The obtuse marginal in the large second diagonal contains diffuse in-stent restenosis which narrows the lumen by 30%.  No high-grade obstruction is noted in the circumflex territory.  Normal left ventricular function with upper normal  LV EDP of 18 mmHg.  EF 60% or greater.  RECOMMENDATIONS:   Continued patency of the bare-metal stent in the obtuse marginal.  No high-grade  obstructive disease that can be implicated as a source/cause of the patient's presenting symptoms.\  Further management and work-up as directed by the primary team.  Diagnostic Dominance: Co-dominant  Echocardiogram 06/18/2020 1. Left ventricular ejection fraction, by estimation, is 50 to 55%. The left ventricle has normal function. The left ventricle has no regional wall motion abnormalities. There is mild left ventricular hypertrophy. Left ventricular diastolic parameters are consistent with Grade I diastolic dysfunction (impaired relaxation). Elevated left atrial pressure. 2. Right ventricular systolic function is normal. The right ventricular size is normal. 3. The mitral valve is normal in structure. Mild mitral valve regurgitation. No evidence of mitral stenosis. 4. The aortic valve has an indeterminant number of cusps. Aortic valve regurgitation is not visualized. No aortic stenosis is present. 5. The inferior vena cava is normal in size with greater than 50% respiratory variability, suggesting right atrial pressure of 3 mmHg   Lexiscan Myoview 04/04/2018  No diagnostic ST segment changes to indicate ischemia.  Small, mild intensity, mid to apical anterior defect that is more prominent at rest and consistent with soft tissue attenuation. No clear ischemic territories.  This is a low risk study.  Nuclear stress EF: 68%   Lexiscan Cardiolite 5/44/2016:  There was no ST segment deviation noted during stress.  Defect 1: There is a medium defect of moderate severity present in the mid anterior and apical anterior location.  This is a low risk study.  Fixed anterior defect as outlined, most consistent with breast attenuation. LVEF 72%.   Assessment and Plan:  1. Coronary artery disease involving native coronary artery of native  heart, angina presence unspecified   2. Lymphedema   3. DOE (dyspnea on exertion)   4. Hypertension associated with diabetes ([place])    1. Coronary artery disease involving native coronary artery of native heart, angina presence unspecified Recent cardiac catheterization: Second marginal lesion 30% stenosis, codominant RCA with marked diffuse atherosclerosis, no high-grade obstructive disease, widely patent left main, LAD gives rise to 1 large diagonal.  Distal to the bifurcation of the diagonal the LAD contains eccentric 40 to 50% narrowing with a region of calcification.  Ostium of diagonal is 25% narrowed.  No high-grade focal stenosis noted in LAD territory.  Circumflex was codominant.  Obtuse marginal and the large second diagonal contained diffuse in-stent restenosis which narrows the lumen by 30%.  EF 60% or greater.  Stop 325 mg aspirin and start 81 mg aspirin.  Continue sublingual nitroglycerin 0.4 mg sublingual.  Please send for refill for a new bottle.  Patient states hers is out of date.  Continue Crestor 20 mg p.o. daily.  Continue metoprolol 25 mg p.o. twice daily.  2. Lymphedema Patient has bilateral lymphedema.  Please refer to lymphedema clinic for evaluation and possible pneumatic compression devices.  Continue Lasix 40 mg p.o. daily as needed for edema or fluid.  Notes state patient is taking Lasix 40 p.o. twice daily.   3. DOE (dyspnea on exertion) Patient has exertional dyspnea.  This is likely a combination of deconditioning, morbid obesity, and obesity hypoventilation syndrome along with obstructive sleep apnea on CPAP.  4.  Hypertension Blood pressure initially elevated at 160/78.  Recheck in left arm was 139/81.  Continue telmisartan 40 mg p.o. daily.  Medication Adjustments/Labs and Tests Ordered: Current medicines are reviewed at length with the patient today.  Concerns regarding medicines are outlined above.   Disposition: Follow-up with Dr. Domenic Polite or APP 6 months.   Signed, Levell July, NP 08/05/2020  2:13 PM    [facility] [address], [place], [place] [address] Phone: [phone]; Fax: [phone]

## 2020-08-05 ENCOUNTER — Ambulatory Visit (INDEPENDENT_AMBULATORY_CARE_PROVIDER_SITE_OTHER): Payer: Medicare Other | Admitting: Family Medicine

## 2020-08-05 ENCOUNTER — Encounter: Payer: Self-pay | Admitting: Family Medicine

## 2020-08-05 VITALS — BP 160/78 | HR 66 | Ht 62.0 in | Wt 319.0 lb

## 2020-08-05 DIAGNOSIS — I890 Lymphedema, not elsewhere classified: Secondary | ICD-10-CM

## 2020-08-05 DIAGNOSIS — I10 Essential (primary) hypertension: Secondary | ICD-10-CM | POA: Diagnosis not present

## 2020-08-05 DIAGNOSIS — I2510 Atherosclerotic heart disease of native coronary artery without angina pectoris: Secondary | ICD-10-CM | POA: Diagnosis not present

## 2020-08-05 DIAGNOSIS — E1159 Type 2 diabetes mellitus with other circulatory complications: Secondary | ICD-10-CM | POA: Diagnosis not present

## 2020-08-05 DIAGNOSIS — R0600 Dyspnea, unspecified: Secondary | ICD-10-CM | POA: Diagnosis not present

## 2020-08-05 DIAGNOSIS — R0609 Other forms of dyspnea: Secondary | ICD-10-CM

## 2020-08-05 MED ORDER — ASPIRIN EC 81 MG PO TBEC: 81.0000 mg | DELAYED_RELEASE_TABLET | Freq: Every day | ORAL | Status: DC

## 2020-08-05 MED ORDER — NITROGLYCERIN 0.4 MG SL SUBL: 0.4000 mg | SUBLINGUAL_TABLET | SUBLINGUAL | 3 refills | Status: DC | PRN

## 2020-08-05 NOTE — Patient Instructions (Signed)
Medication Instructions:  Continue all current medications.  Labwork: none  Testing/Procedures: none  Follow-Up: 6 months   Any Other Special Instructions Will Be Listed Below (If Applicable). You have been referred to:  [facility] clinic.  If you need a refill on your cardiac medications before your next appointment, please call your pharmacy.

## 2020-08-07 NOTE — Progress Notes (Signed)
Subjective: CC: Follow up type 2 diabetes PCP: Janora Norlander, DO [name] is a 59 y.o. female presenting to clinic today for:  1.  Type 2 Diabetes with hypertension and hyperlipidemia:  BGs: 98-230s.  She reports compliance with Truicity 0.32m each week (reduced due to GI probs), Amaryl 2 mg daily and metformin 1051mXR daily (changed due to GI irritation). She was started on Farxiga over a month ago.  Compliant with her antihypertensives of Micardis 40 mg daily, Lopressor 25 mg twice daily and Crestor 20 mg daily. She did run out of FaIran few days ago.  Still waiting on shipment of Farxiga and new dose of Trulicity.  She has chronic SOB.  Has not followed up w/ Pulm yet due to father being on his death bed.  Report increased stress in particular surrounding the cast is ongoing in her family currently.  Her cardiologist will be sending her to the lymphedema clinic in [place].  She continues to have quite a bit of edema in the lower extremities.  She was hospitalized for chest pain in the last few months and denies any recurrent chest pain.   Last eye exam: Needs Last foot exam: Needs Last A1c:  Lab Results  Component Value Date   HGBA1C 8.4 (H) 06/18/2020   Nephropathy screen indicated?:  On ARB Last flu, zoster and/or pneumovax:  Immunization History  Administered Date(s) Administered  . Influenza,inj,Quad PF,6+ Mos 08/10/2013, 08/28/2014, 09/25/2015, 11/23/2017, 11/27/2018  . Pneumococcal Conjugate-13 11/27/2018   ROS: Per HPI  Allergies  Allergen Reactions  . Morphine And Related Nausea And Vomiting   Past Medical History:  Diagnosis Date  . Arthritis   . CAD (coronary artery disease)    BMS to circumflex 2007 - Dr. BrOlevia Perches. CKD (chronic kidney disease) stage 3, GFR 30-59 ml/min   . Essential hypertension   . Falls frequently   . GERD (gastroesophageal reflux disease)   . HA (headache)   . Hyperlipidemia   . Left-sided face pain   .  Morbid obesity ([facility]  . Noncompliance   . OSA (obstructive sleep apnea)    Uses CPAP  . Type 2 diabetes mellitus ([facility]  . Urinary incontinence     Current Outpatient Medications:  .  acetaminophen (TYLENOL) 650 MG CR tablet, Take 650 mg by mouth every 8 (eight) hours as needed for pain., Disp: , Rfl:  .  albuterol (VENTOLIN HFA) 108 (90 Base) MCG/ACT inhaler, Inhale 2 puffs into the lungs every 6 (six) hours as needed for wheezing or shortness of breath (for rescue)., Disp: 1 Inhaler, Rfl: 0 .  aspirin EC 81 MG tablet, Take 1 tablet (81 mg total) by mouth daily., Disp: , Rfl:  .  Blood Glucose Monitoring Suppl (ONETOUCH VERIO) w/Device KIT, Use to test blood sugar twice daily as directed; DX: E11.69, Disp: 1 kit, Rfl: 1 .  colestipol (COLESTID) 1 g tablet, Take 2 tablets (2 g total) by mouth 2 (two) times daily., Disp: 120 tablet, Rfl: 4 .  diclofenac sodium (VOLTAREN) 1 % GEL, Apply 4 g topically 4 (four) times daily., Disp: 400 g, Rfl: 2 .  Dulaglutide (TRULICITY) 0.0.10GXN/2.3FTOPN, Inject 0.75 mg into the skin once a week. (Patient taking differently: Inject 0.75 mg into the skin once a week. On Tuesdays), Disp: 4 pen, Rfl: 3 .  furosemide (LASIX) 40 MG tablet, TAKE 1 TABLET BY MOUTH ONCE DAILY AS NEEDED FOR EDEMA OR  FLUID (Patient  taking differently: Take 40 mg by mouth 2 (two) times daily. ), Disp: 90 tablet, Rfl: 0 .  gabapentin (NEURONTIN) 300 MG capsule, TAKE 2 CAPSULES BY MOUTH IN THE MORNING AND 2 AT NOON AND 2 IN THE EVENING ., Disp: 180 capsule, Rfl: 0 .  glimepiride (AMARYL) 2 MG tablet, Take 1 tablet (2 mg total) by mouth daily with breakfast. (Needs to be seen before next refill), Disp: 30 tablet, Rfl: 0 .  glucose blood (ONETOUCH VERIO) test strip, Use to test blood sugar twice daily as directed; DX: E11.69, Disp: 100 each, Rfl: 5 .  metFORMIN (GLUCOPHAGE XR) 500 MG 24 hr tablet, Take 2 tablets (1,000 mg total) by mouth daily with breakfast., Disp: 60 tablet, Rfl: 3 .   metoprolol tartrate (LOPRESSOR) 25 MG tablet, Take 1 tablet by mouth twice daily, Disp: 180 tablet, Rfl: 1 .  nitroGLYCERIN (NITROSTAT) 0.4 MG SL tablet, Place 1 tablet (0.4 mg total) under the tongue every 5 (five) minutes as needed for chest pain., Disp: 25 tablet, Rfl: 3 .  omeprazole (PRILOSEC) 40 MG capsule, TAKE 1 CAPSULE BY MOUTH 30 MINUTES ONCE DAILY BEFORE MEAL(S), Disp: 30 capsule, Rfl: 0 .  OneTouch Delica Lancets 45G MISC, Use to test blood sugar twice daily as directed; DX: E11.69, Disp: 100 each, Rfl: 3 .  OneTouch Delica Lancets 25W MISC, Check blood sugar BID and prn  E11.9, Disp: 100 each, Rfl: 11 .  rosuvastatin (CRESTOR) 20 MG tablet, Take 1 tablet (20 mg total) by mouth at bedtime. (Needs to be seen before next refill), Disp: 30 tablet, Rfl: 0 .  telmisartan (MICARDIS) 40 MG tablet, Take 1 tablet (40 mg total) by mouth daily., Disp: 30 tablet, Rfl: 0 Social History   Socioeconomic History  . Marital status: Married    Spouse name: Gwyndolyn Saxon  . Number of children: 5  . Years of education: 39 th  . Highest education level: 10th grade  Occupational History  . Occupation: disabled     Comment: disabled  Tobacco Use  . Smoking status: Current Every Day Smoker    Packs/day: 1.00    Years: 47.00    Pack years: 47.00    Types: Cigarettes    Start date: 05/14/1972  . Smokeless tobacco: Never Used  Vaping Use  . Vaping Use: Never used  Substance and Sexual Activity  . Alcohol use: No    Alcohol/week: 0.0 standard drinks  . Drug use: No  . Sexual activity: Not Currently    Birth control/protection: Surgical  Other Topics Concern  . Not on file  Social History Narrative   Patient lives at home with her husband and grandchild Gwyndolyn Saxon). Patient is disabled.   Patient has 10 th grade education.   Right handed.   Caffeine- one  cup of coffee and  One soda Dr.Pepper/ tea. daily   Social Determinants of Health   Financial Resource Strain: Medium Risk  . Difficulty of  Paying Living Expenses: Somewhat hard  Food Insecurity: No Food Insecurity  . Worried About Charity fundraiser in the Last Year: Never true  . Ran Out of Food in the Last Year: Never true  Transportation Needs: No Transportation Needs  . Lack of Transportation (Medical): No  . Lack of Transportation (Non-Medical): No  Physical Activity: Inactive  . Days of Exercise per Week: 0 days  . Minutes of Exercise per Session: 0 min  Stress: Stress Concern Present  . Feeling of Stress : To some extent  Social  Connections: Moderately Isolated  . Frequency of Communication with Friends and Family: More than three times a week  . Frequency of Social Gatherings with Friends and Family: More than three times a week  . Attends Religious Services: Never  . Active Member of Clubs or Organizations: No  . Attends Archivist Meetings: Never  . Marital Status: Married  Human resources officer Violence: Not At Risk  . Fear of Current or Ex-Partner: No  . Emotionally Abused: No  . Physically Abused: No  . Sexually Abused: No   Family History  Problem Relation Age of Onset  . Coronary artery disease Father   . Cancer - Colon Father   . Diabetes Father   . High Cholesterol Father   . Breast cancer Mother   . Arthritis Sister   . Asthma Sister   . High Cholesterol Daughter   . Thyroid disease Daughter   . Hiatal hernia Son   . Congenital heart disease Son     Objective: Office vital signs reviewed. BP 133/73   Pulse 71   Temp (!) 97 F (36.1 C)   Ht _0  (1.575 m)   Wt (!) 323 lb 9.6 oz (146.8 kg)   SpO2 98%   BMI 59.19 kg/m   Physical Examination:  General: Awake, alert, morbidly obese, depressed appearing HEENT: Normal, sclera white, MMM Cardio: regular rate and rhythm, S1S2 heard, no murmurs appreciated Pulm: clear to auscultation bilaterally, no wheezes, rhonchi or rales; normal work of breathing on room air Extremities: warm, well perfused, moderate/ severe nonpitting LE  edema. No cyanosis or clubbing; +1 pulses bilaterally MSK: antalgic gait, uses cane for ambulation Neuro: see DM foot Diabetic Foot Exam - Simple   Simple Foot Form Diabetic Foot exam was performed with the following findings: Yes 08/08/2020 12:33 PM  Visual Inspection See comments: Yes Sensation Testing See comments: Yes Pulse Check Posterior Tibialis and Dorsalis pulse intact bilaterally: Yes Comments Absent monofilament.  She has quite a bit of edema in bilateral lower extremities.  She has venous stasis changes to the skin bilaterally and hyperkeratotic skin noted between folds.  There are no ulcerations or preulcerative calluses noted.  Pedal pulses are palpable but exam is somewhat limited secondary to habitus.    Depression screen [facility] 2/9 03/03/2020 03/03/2020 12/21/2019  Decreased Interest 3 0 0  Down, Depressed, Hopeless 1 0 0  PHQ - 2 Score 4 0 0  Altered sleeping 1 - -  Tired, decreased energy 3 - -  Change in appetite 1 - -  Feeling bad or failure about yourself  3 - -  Trouble concentrating 1 - -  Moving slowly or fidgety/restless 3 - -  Suicidal thoughts 0 - -  PHQ-9 Score 16 - -  Difficult doing work/chores Somewhat difficult - -  Some recent data might be hidden   Assessment/ Plan: 59 y.o. female   1. Type 2 diabetes mellitus without complication, without long-term current use of insulin ([place]) Remains uncontrolled but has improved quite a bit since our previous check here in the office.  Down to 7.9.  I think if we could get her medications to her her blood sugars would look better.  She was given samples of the [place] today.  She will continue Metformin.  Awaiting Trulicity to arrive.  Continue Amaryl.  Diabetic foot exam performed.  She will follow-up in 3 months, sooner if needed - Bayer DCA Hb A1c Waived  2. Hypertension associated with diabetes ([place]) Controlled -  CBC with Differential/Platelet - CMP14+EGFR  3. Hyperlipidemia associated with type 2 diabetes  mellitus (HCC) Continue statin - Lipid panel  4. Chronic bronchitis, unspecified chronic bronchitis type (H[facility]) Albuterol renewed.  Telmisartan renewed.  Reinforced need to follow-up with pulmonology as scheduled. - albuterol (VENTOLIN HFA) 108 (90 Base) MCG/ACT inhaler; Inhale 2 puffs into the lungs every 6 (six) hours as needed for wheezing or shortness of breath (for rescue).  Dispense: 1 each; Refill: 0   No orders of the defined types were placed in this encounter.  No orders of the defined types were placed in this encounter.  [employer] information was provided for transportation services.  Janora Norlander, DO [place] [phone]

## 2020-08-08 ENCOUNTER — Ambulatory Visit (INDEPENDENT_AMBULATORY_CARE_PROVIDER_SITE_OTHER): Payer: Medicare Other | Admitting: Family Medicine

## 2020-08-08 ENCOUNTER — Encounter: Payer: Self-pay | Admitting: Family Medicine

## 2020-08-08 ENCOUNTER — Other Ambulatory Visit: Payer: Self-pay

## 2020-08-08 VITALS — BP 133/73 | HR 71 | Temp 97.0°F | Ht 62.0 in | Wt 323.6 lb

## 2020-08-08 DIAGNOSIS — E785 Hyperlipidemia, unspecified: Secondary | ICD-10-CM

## 2020-08-08 DIAGNOSIS — E1159 Type 2 diabetes mellitus with other circulatory complications: Secondary | ICD-10-CM | POA: Diagnosis not present

## 2020-08-08 DIAGNOSIS — E1169 Type 2 diabetes mellitus with other specified complication: Secondary | ICD-10-CM

## 2020-08-08 DIAGNOSIS — E119 Type 2 diabetes mellitus without complications: Secondary | ICD-10-CM | POA: Diagnosis not present

## 2020-08-08 DIAGNOSIS — I152 Hypertension secondary to endocrine disorders: Secondary | ICD-10-CM | POA: Diagnosis not present

## 2020-08-08 DIAGNOSIS — J42 Unspecified chronic bronchitis: Secondary | ICD-10-CM

## 2020-08-08 LAB — BAYER DCA HB A1C WAIVED: HB A1C (BAYER DCA - WAIVED): 7.9 % — ABNORMAL HIGH (ref <7.0)

## 2020-08-08 MED ORDER — DAPAGLIFLOZIN PROPANEDIOL 5 MG PO TABS: 5.0000 mg | ORAL_TABLET | Freq: Every day | ORAL | 0 refills | Status: DC

## 2020-08-08 MED ORDER — TELMISARTAN 40 MG PO TABS
40.0000 mg | ORAL_TABLET | Freq: Every day | ORAL | 0 refills | Status: DC
Start: 2020-08-08 — End: 2020-12-05

## 2020-08-08 MED ORDER — ALBUTEROL SULFATE HFA 108 (90 BASE) MCG/ACT IN AERS: 2.0000 | INHALATION_SPRAY | Freq: Four times a day (QID) | RESPIRATORY_TRACT | 0 refills | Status: DC | PRN

## 2020-08-08 NOTE — Patient Instructions (Addendum)
A1c somewhat better.  Was down to 7.9 today.

## 2020-08-09 LAB — CMP14+EGFR
ALT: 11 IU/L (ref 0–32)
AST: 13 IU/L (ref 0–40)
Albumin/Globulin Ratio: 1.7 (ref 1.2–2.2)
Albumin: 4 g/dL (ref 3.8–4.8)
Alkaline Phosphatase: 105 IU/L (ref 44–121)
BUN/Creatinine Ratio: 7 — ABNORMAL LOW (ref 12–28)
BUN: 7 mg/dL — ABNORMAL LOW (ref 8–27)
Bilirubin Total: 0.2 mg/dL (ref 0.0–1.2)
CO2: 23 mmol/L (ref 20–29)
Calcium: 8.9 mg/dL (ref 8.7–10.3)
Chloride: 106 mmol/L (ref 96–106)
Creatinine, Ser: 0.98 mg/dL (ref 0.57–1.00)
GFR calc Af Amer: 72 mL/min/{1.73_m2} (ref >59)
GFR calc non Af Amer: 62 mL/min/{1.73_m2} (ref >59)
Globulin, Total: 2.4 g/dL (ref 1.5–4.5)
Glucose: 165 mg/dL — ABNORMAL HIGH (ref 65–99)
Potassium: 4.1 mmol/L (ref 3.5–5.2)
Sodium: 143 mmol/L (ref 134–144)
Total Protein: 6.4 g/dL (ref 6.0–8.5)

## 2020-08-09 LAB — CBC WITH DIFFERENTIAL/PLATELET
Basophils Absolute: 0.1 10*3/uL (ref 0.0–0.2)
Basos: 1 %
EOS (ABSOLUTE): 0.4 10*3/uL (ref 0.0–0.4)
Eos: 5 %
Hematocrit: 38.8 % (ref 34.0–46.6)
Hemoglobin: 12.8 g/dL (ref 11.1–15.9)
Immature Grans (Abs): 0 10*3/uL (ref 0.0–0.1)
Immature Granulocytes: 0 %
Lymphocytes Absolute: 3 10*3/uL (ref 0.7–3.1)
Lymphs: 35 %
MCH: 30.6 pg (ref 26.6–33.0)
MCHC: 33 g/dL (ref 31.5–35.7)
MCV: 93 fL (ref 79–97)
Monocytes Absolute: 0.6 10*3/uL (ref 0.1–0.9)
Monocytes: 7 %
Neutrophils Absolute: 4.4 10*3/uL (ref 1.4–7.0)
Neutrophils: 52 %
Platelets: 118 10*3/uL — ABNORMAL LOW (ref 150–450)
RBC: 4.18 x10E6/uL (ref 3.77–5.28)
RDW: 16.2 % — ABNORMAL HIGH (ref 11.7–15.4)
WBC: 8.5 10*3/uL (ref 3.4–10.8)

## 2020-08-09 LAB — LIPID PANEL
Chol/HDL Ratio: 2.5 ratio (ref 0.0–4.4)
Cholesterol, Total: 91 mg/dL — ABNORMAL LOW (ref 100–199)
HDL: 36 mg/dL — ABNORMAL LOW (ref >39)
LDL Chol Calc (NIH): 31 mg/dL (ref 0–99)
Triglycerides: 134 mg/dL (ref 0–149)
VLDL Cholesterol Cal: 24 mg/dL (ref 5–40)

## 2020-08-18 ENCOUNTER — Other Ambulatory Visit: Payer: Self-pay | Admitting: Family Medicine

## 2020-08-18 DIAGNOSIS — K219 Gastro-esophageal reflux disease without esophagitis: Secondary | ICD-10-CM

## 2020-08-30 ENCOUNTER — Other Ambulatory Visit: Payer: Self-pay | Admitting: Family Medicine

## 2020-08-30 DIAGNOSIS — E1142 Type 2 diabetes mellitus with diabetic polyneuropathy: Secondary | ICD-10-CM

## 2020-09-16 ENCOUNTER — Telehealth: Payer: Self-pay

## 2020-09-16 NOTE — Telephone Encounter (Signed)
Televisit appointment made.  Patient aware  

## 2020-09-16 NOTE — Telephone Encounter (Signed)
Trulicity does not cause yeast infections.  Additionally, as per office policy, all new medications require an appointment.  Please put her in for either televisit with me or with another provider if one is available.

## 2020-09-17 ENCOUNTER — Encounter: Payer: Self-pay | Admitting: Family Medicine

## 2020-09-17 ENCOUNTER — Ambulatory Visit (INDEPENDENT_AMBULATORY_CARE_PROVIDER_SITE_OTHER): Payer: Medicare Other | Admitting: Family Medicine

## 2020-09-17 DIAGNOSIS — F4321 Adjustment disorder with depressed mood: Secondary | ICD-10-CM

## 2020-09-17 DIAGNOSIS — B3731 Acute candidiasis of vulva and vagina: Secondary | ICD-10-CM

## 2020-09-17 DIAGNOSIS — B373 Candidiasis of vulva and vagina: Secondary | ICD-10-CM

## 2020-09-17 MED ORDER — NYSTATIN 100000 UNIT/GM EX CREA: 1.0000 "application " | TOPICAL_CREAM | Freq: Two times a day (BID) | CUTANEOUS | 0 refills | Status: DC

## 2020-09-17 MED ORDER — FLUCONAZOLE 150 MG PO TABS: 150.0000 mg | ORAL_TABLET | Freq: Once | ORAL | 0 refills | Status: AC

## 2020-09-17 NOTE — Progress Notes (Signed)
Telephone visit  Subjective: CC: Yeast PCP: Stacey Norlander, DO [id number] [name] is a 59 y.o. female calls for telephone consult today. Patient provides verbal consent for consult held via phone.  Due to COVID-19 pandemic this visit was conducted virtually. This visit type was conducted due to national recommendations for restrictions regarding the COVID-19 Pandemic (e.g. social distancing, sheltering in place) in an effort to limit this patient's exposure and mitigate transmission in our community. All issues noted in this document were discussed and addressed.  A physical exam was not performed with this format.   Location of patient: home Location of provider: [facility] Others present for call: Spouse  1. Yeast Patient reports yeast infection for 2 weeks.  She used OTC monistat which did help but did not resolve issue.  She reports pruritis, vaginal discharge.  She reports itching of the vaginal and perineal areas.  She reports light pink bleeding in her underwear.  She has been postmenopausal for >30 years.  This occurred once.  2.  Grief Patient reports that her father recently passed away.  She is saddened by this but does not feel that she needs counseling services just yet.  They will be cleaning out his home soon and this may change but she will call me if she changes her mind.  She has her spouse who supports her emotionally.  ROS: Per HPI  Allergies  Allergen Reactions  . Morphine And Related Nausea And Vomiting   Past Medical History:  Diagnosis Date  . Arthritis   . CAD (coronary artery disease)    BMS to circumflex 2007 - Dr. Olevia Perches  . CKD (chronic kidney disease) stage 3, GFR 30-59 ml/min (HCC)   . Essential hypertension   . Falls frequently   . GERD (gastroesophageal reflux disease)   . HA (headache)   . Hyperlipidemia   . Left-sided face pain   . Morbid obesity ([place])   . Noncompliance   . OSA (obstructive sleep apnea)    Uses CPAP  . Type 2 diabetes mellitus  ([place])   . Urinary incontinence     Current Outpatient Medications:  .  acetaminophen (TYLENOL) 650 MG CR tablet, Take 650 mg by mouth every 8 (eight) hours as needed for pain., Disp: , Rfl:  .  albuterol (VENTOLIN HFA) 108 (90 Base) MCG/ACT inhaler, Inhale 2 puffs into the lungs every 6 (six) hours as needed for wheezing or shortness of breath (for rescue)., Disp: 1 each, Rfl: 0 .  aspirin EC 81 MG tablet, Take 1 tablet (81 mg total) by mouth daily., Disp: , Rfl:  .  Blood Glucose Monitoring Suppl (ONETOUCH VERIO) w/Device KIT, Use to test blood sugar twice daily as directed; DX: E11.69, Disp: 1 kit, Rfl: 1 .  colestipol (COLESTID) 1 g tablet, Take 2 tablets (2 g total) by mouth 2 (two) times daily., Disp: 120 tablet, Rfl: 4 .  dapagliflozin propanediol (FARXIGA) 5 MG TABS tablet, Take 1 tablet (5 mg total) by mouth daily before breakfast., Disp: 30 tablet, Rfl: 0 .  diclofenac sodium (VOLTAREN) 1 % GEL, Apply 4 g topically 4 (four) times daily., Disp: 400 g, Rfl: 2 .  Dulaglutide (TRULICITY) 4.40 HK/7.4QV SOPN, Inject 0.75 mg into the skin once a week. (Patient taking differently: Inject 0.75 mg into the skin once a week. On Tuesdays), Disp: 4 pen, Rfl: 3 .  furosemide (LASIX) 40 MG tablet, TAKE 1 TABLET BY MOUTH ONCE DAILY AS NEEDED FOR EDEMA OR  FLUID (Patient taking  differently: Take 40 mg by mouth 2 (two) times daily. ), Disp: 90 tablet, Rfl: 0 .  gabapentin (NEURONTIN) 300 MG capsule, TAKE 2 CAPSULES BY MOUTH ONCE DAILY IN THE MORNING THEN TAKE 2 CAPSULES ONCE DAILY AT NOON THEN TAKE 2 CAPSULES ONCE DAILY IN THE EVENING, Disp: 180 capsule, Rfl: 0 .  glimepiride (AMARYL) 2 MG tablet, Take 1 tablet (2 mg total) by mouth daily with breakfast. (Needs to be seen before next refill), Disp: 30 tablet, Rfl: 0 .  glucose blood (ONETOUCH VERIO) test strip, Use to test blood sugar twice daily as directed; DX: E11.69, Disp: 100 each, Rfl: 5 .  metFORMIN (GLUCOPHAGE XR) 500 MG 24 hr tablet, Take 2 tablets  (1,000 mg total) by mouth daily with breakfast., Disp: 60 tablet, Rfl: 3 .  metoprolol tartrate (LOPRESSOR) 25 MG tablet, Take 1 tablet by mouth twice daily, Disp: 180 tablet, Rfl: 1 .  nitroGLYCERIN (NITROSTAT) 0.4 MG SL tablet, Place 1 tablet (0.4 mg total) under the tongue every 5 (five) minutes as needed for chest pain., Disp: 25 tablet, Rfl: 3 .  omeprazole (PRILOSEC) 40 MG capsule, TAKE 1 CAPSULE BY MOUTH 30 MINUTES ONCE DAILY BEFORE MEAL(S), Disp: 90 capsule, Rfl: 3 .  OneTouch Delica Lancets 35D MISC, Use to test blood sugar twice daily as directed; DX: E11.69, Disp: 100 each, Rfl: 3 .  OneTouch Delica Lancets 89B MISC, Check blood sugar BID and prn  E11.9, Disp: 100 each, Rfl: 11 .  rosuvastatin (CRESTOR) 20 MG tablet, Take 1 tablet (20 mg total) by mouth at bedtime. (Needs to be seen before next refill), Disp: 30 tablet, Rfl: 0 .  telmisartan (MICARDIS) 40 MG tablet, Take 1 tablet (40 mg total) by mouth daily., Disp: 90 tablet, Rfl: 0  Assessment/ Plan: 59 y.o. female   Yeast vaginitis - Plan: fluconazole (DIFLUCAN) 150 MG tablet, nystatin cream (MYCOSTATIN)  Grief  I suspect the trace amount of blood that she found was likely due to the yeast vaginitis.  However we discussed that this must be evaluated if it recurs, particularly outside of her symptoms.  She understands that this could indicate a malignancy.  In the interim, I suspect that the [place] is likely the culprit of her symptoms.  I have sent in a Diflucan tablet to take today and then repeat in 3 days.  Additionally have sent in nystatin to be applied to the affected areas twice daily until symptoms resolve.  Unfortunately, she recently lost her father.  I did offer her referral to [facility] for grief counseling but she would like to hold off on this for now.  She feels well supported by her spouse.  She will follow-up as needed on her symptoms  Start time: 9:38am End time: 9:49am  Total time spent on patient care (including  telephone call/ virtual visit): 11 minutes  [place], [place] [phone]

## 2020-09-22 ENCOUNTER — Other Ambulatory Visit: Payer: Self-pay | Admitting: Family Medicine

## 2020-09-22 DIAGNOSIS — E785 Hyperlipidemia, unspecified: Secondary | ICD-10-CM

## 2020-09-22 DIAGNOSIS — E1169 Type 2 diabetes mellitus with other specified complication: Secondary | ICD-10-CM

## 2020-09-23 ENCOUNTER — Ambulatory Visit (HOSPITAL_COMMUNITY): Payer: Medicare Other | Attending: Family Medicine | Admitting: Physical Therapy

## 2020-09-23 ENCOUNTER — Other Ambulatory Visit: Payer: Self-pay

## 2020-09-23 ENCOUNTER — Encounter (HOSPITAL_COMMUNITY): Payer: Self-pay | Admitting: Physical Therapy

## 2020-09-23 DIAGNOSIS — M79604 Pain in right leg: Secondary | ICD-10-CM | POA: Insufficient documentation

## 2020-09-23 DIAGNOSIS — M79605 Pain in left leg: Secondary | ICD-10-CM | POA: Diagnosis present

## 2020-09-23 DIAGNOSIS — I890 Lymphedema, not elsewhere classified: Secondary | ICD-10-CM | POA: Diagnosis present

## 2020-09-23 DIAGNOSIS — R262 Difficulty in walking, not elsewhere classified: Secondary | ICD-10-CM | POA: Diagnosis present

## 2020-09-23 NOTE — Therapy (Signed)
[facility] [address] [place], [place], [address] Phone: [phone]   Fax:  [phone]  Physical Therapy Evaluation  Patient Details  Name: [name] MRN: [id number] Date of Birth: [birth year] Referring Provider (PT): [name]   Encounter Date: 09/23/2020   PT End of Session - 09/23/20 1154    Visit Number 1    Number of Visits 18    Date for PT Re-Evaluation 11/18/20    Authorization Type [facility] medicare    Progress Note Due on Visit 10    PT Start Time 1040    PT Stop Time 1145    PT Time Calculation (min) 65 min    Activity Tolerance Patient tolerated treatment well    Behavior During Therapy [facility] for tasks assessed/performed           Past Medical History:  Diagnosis Date  . Arthritis   . CAD (coronary artery disease)    BMS to circumflex 2007 - Dr. Juanda Chance  . CKD (chronic kidney disease) stage 3, GFR 30-59 ml/min (HCC)   . Essential hypertension   . Falls frequently   . GERD (gastroesophageal reflux disease)   . HA (headache)   . Hyperlipidemia   . Left-sided face pain   . Morbid obesity (HCC)   . Noncompliance   . OSA (obstructive sleep apnea)    Uses CPAP  . Type 2 diabetes mellitus (HCC)   . Urinary incontinence     Past Surgical History:  Procedure Laterality Date  . ABDOMINAL HERNIA REPAIR    . ABDOMINAL HYSTERECTOMY    . BACK SURGERY    . BREAST SURGERY     biopsy per right breast; pt states has silver piece in for marking   . Cataract surgery Bilateral   . CHOLECYSTECTOMY    . COLONOSCOPY N/A 04/10/2013   Procedure: COLONOSCOPY;  Surgeon: Malissa Hippo, MD;  Location: [facility] ENDO SUITE;  Service: Endoscopy;  Laterality: N/A;  . CORONARY ANGIOPLASTY WITH STENT PLACEMENT  2012  . LEFT HEART CATH AND CORONARY ANGIOGRAPHY N/A 06/19/2020   Procedure: LEFT HEART CATH AND CORONARY ANGIOGRAPHY;  Surgeon: Lyn Records, MD;  Location: [facility] INVASIVE CV LAB;  Service: Cardiovascular;  Laterality: N/A;  .  LUMBAR LAMINECTOMY/DECOMPRESSION MICRODISCECTOMY N/A 04/25/2015   Procedure: CENTRAL LUMBAR /DECOMPRESSION L4-L5;  Surgeon: Ranee Gosselin, MD;  Location: [facility] ORS;  Service: Orthopedics;  Laterality: N/A;  . TUBAL LIGATION      There were no vitals filed for this visit.    Subjective Assessment - 09/23/20 1046    Subjective Pt states that her heart MD was concerned because she was having so much fluid gathering in her legs so he sent her to this clinic.  She was diagnosed with lymphedema three years ago.  She does not do anything for her lymphedema as she has never been educated in lymphedema and what you can to for them.  She states both legs hurt stating that her right leg is worse than the Lt.  She staes that both legs have bouts where fluid comes out of her legs then it will dry up.  She has had about 4 of these episodes.    Pertinent History OA, CAD, HTN, DM, obesity, CKD; prior back surgery.    Limitations Lifting;Standing;Walking;House hold activities    How long can you sit comfortably? able to sit without difficulty.    How long can you stand comfortably? 2-3 minutes    How long can  you walk comfortably? Pt walks with a cane able to walk for 2-3 minutes    Patient Stated Goals Pt wants to be able to walk and stand longer    Currently in Pain? Yes    Pain Score 5    can get past a 10   Pain Location Leg    Pain Orientation Right;Left    Pain Descriptors / Indicators Aching    Pain Type Chronic pain    Pain Onset More than a month ago    Pain Frequency Constant    Aggravating Factors  standing or walking    Pain Relieving Factors sitting    Effect of Pain on Daily Activities limits    Multiple Pain Sites --   chronic back and knee pain we will not be addressing             [facility] PT Assessment - 09/23/20 0001      Assessment   Medical Diagnosis B LE lymphedema    Referring Provider (PT) [name]    Onset Date/Surgical Date --   years    Next MD Visit 12/28/2020     Prior Therapy none for this       Precautions   Precautions --   cellulitis      Restrictions   Weight Bearing Restrictions No      Balance Screen   Has the patient fallen in the past 6 months No    Has the patient had a decrease in activity level because of a fear of falling?  Yes    Is the patient reluctant to leave their home because of a fear of falling?  No      Prior Function   Level of Independence Independent      Cognition   Overall Cognitive Status Within Functional Limits for tasks assessed             LYMPHEDEMA/ONCOLOGY QUESTIONNAIRE - 09/23/20 0001      What other symptoms do you have   Are you Having Heaviness or Tightness Yes    Are you having Pain Yes    Are you having pitting edema Yes    Body Site legs     Is it Hard or Difficult finding clothes that fit Yes    Do you have infections No    Stemmer Sign Yes      Lymphedema Stage   Stage STAGE 3 ELEPHANTIASIS      Lymphedema Assessments   Lymphedema Assessments Lower extremities      Right Lower Extremity Lymphedema   20 cm Proximal to Suprapatella 74 cm    10 cm Proximal to Suprapatella 74 cm    At Midpatella/Popliteal Crease 63.5 cm    30 cm Proximal to Floor at Lateral Plantar Foot 63.8 cm    20 cm Proximal to Floor at Lateral Plantar Foot 52.2 1    10  cm Proximal to Floor at Lateral Malleoli 38 cm    Circumference of ankle/heel 41 cm.    5 cm Proximal to 1st MTP Joint 30.5 cm    Across MTP Joint 29.3 cm    Around Proximal Great Toe 10.9 cm      Left Lower Extremity Lymphedema   20 cm Proximal to Suprapatella 79 cm    10 cm Proximal to Suprapatella 78.3 cm    At Midpatella/Popliteal Crease 64.3 cm    30 cm Proximal to Floor at Lateral Plantar Foot 66.8 cm    20 cm  Proximal to Floor at Lateral Plantar Foot 54.8 cm    10 cm Proximal to Floor at Lateral Malleoli 39.5 cm    Circumference of ankle/heel 40.5 cm.    5 cm Proximal to 1st MTP Joint 29.5 cm    Across MTP Joint 27.8 cm    Around  Proximal Great Toe 11.1 cm                   Objective measurements completed on examination: See above findings.    LE exercises: ankle pump, LAQ, hip ab/adduction, marching and diaphragmic breathing            PT Education - 09/23/20 1152    Education Details Lymphedema, what it is and the 4 phases of active treatment, What will be required during active and discharge phase, LE exercises    Person(s) Educated Patient    Methods Explanation;Demonstration;Verbal cues;Handout    Comprehension Verbalized understanding;Returned demonstration            PT Short Term Goals - 09/23/20 1203      PT SHORT TERM GOAL #1   Title Pt to be completing her HEP on a daily basis to improve lymphatic circulation.    Time 1    Period Weeks    Status New    Target Date 09/30/20      PT SHORT TERM GOAL #2   Title PT to have lost 3-5 cm in LE at measuring points to allow pt to fit into clothing easier.    Time 3    Period Weeks    Status New    Target Date 10/24/20   Pt will not be able to start decongestive for 2 weeks            PT Long Term Goals - 09/23/20 1205      PT LONG TERM GOAL #1   Title PT to state that she has not had any weeping from her LE since beginning therapy    Time 8    Period Weeks    Status New    Target Date 11/25/20      PT LONG TERM GOAL #2   Title PT to have lost 5cm or more from measuring points in thigh and LE at 30/and 20 to assist in donning clothing    Time 8    Period Weeks    Status New      PT LONG TERM GOAL #3   Title PT to have and be able to don  compression garment    Time 8    Period Weeks    Status New      PT LONG TERM GOAL #4   Title PT to have and have used a compression pump.    Time 8    Period Weeks    Status New                  Plan - 09/23/20 1155    Clinical Impression Statement [name] is a 59 yo female who states that she was diagnosed with lymphedema about three years ago, however she  was never explained what it was, or what she could do to control it.  She states that she got some compression garments on her own but they only came up half her leg and kept falling down. She has has four bouts of weeping from her leg and was unaware that this had to do with the lymphedema.  She is now  being referred to skilled therapy.  Ms. Mastandrea will benefit from skilled therapy for education on lymphedema and what she can do to control it as well as to recieve total decongestive techniqes to reduce her volume to the maximal level to reduce her risk of cellullits and assist in decreasing her pain.    Personal Factors and Comorbidities Time since onset of injury/illness/exacerbation;Comorbidity 3+;Fitness;Past/Current Experience    Comorbidities CAD, HTN , DM, obesity CKD    Examination-Activity Limitations Locomotion Level;Stand;Stairs    Examination-Participation Restrictions Cleaning;Community Activity;Laundry;Meal Prep;Shop    Stability/Clinical Decision Making Evolving/Moderate complexity    Clinical Decision Making Moderate    Rehab Potential Good    PT Frequency 3x / week    PT Duration 6 weeks   Due to holidays pt will not be able to start for 2 weeks.   PT Treatment/Interventions Patient/family education;Manual lymph drainage;Compression bandaging    PT Next Visit Plan cut foam for LE begin manual and compression bandaging for LE only to include toes, progress to thigh as pt tolerates.    PT Home Exercise Plan ankle pumps, LAQ, hip ab/adduction, marching and diaphragmic breathing           Patient will benefit from skilled therapeutic intervention in order to improve the following deficits and impairments:  Decreased activity tolerance, Difficulty walking, Pain, Obesity, Increased edema  Visit Diagnosis: Lymphedema, not elsewhere classified  Pain in right leg  Pain in left leg  Difficulty in walking, not elsewhere classified     Problem List Patient Active Problem List    Diagnosis Date Noted  . Unstable angina (HCC) 06/18/2020  . Moderate nonproliferative diabetic retinopathy of both eyes without macular edema associated with type 2 diabetes mellitus (HCC) 01/05/2019  . Hypertension associated with diabetes (HCC) 09/11/2015  . Spinal stenosis, lumbar region, with neurogenic claudication 04/25/2015  . Arthritis associated with diabetes (HCC) 08/28/2014  . Peripheral edema 08/28/2014  . Diabetic neuropathy (HCC) 02/18/2014  . External bleeding hemorrhoids 04/10/2013  . Morbid (severe) obesity due to excess calories (HCC) 04/09/2013  . Diabetes (HCC) 04/09/2013  . Thrombocytopenia, unspecified (HCC) 04/09/2013  . Chest pain 04/09/2013  . OSA (obstructive sleep apnea) 07/06/2011  . DOE (dyspnea on exertion) 06/06/2011  . Coronary artery disease 02/03/2011  . GERD (gastroesophageal reflux disease) 02/03/2011  . Hyperlipidemia associated with type 2 diabetes mellitus (HCC) 02/03/2011  . Noncompliance 02/03/2011  . Generalized anxiety disorder 02/03/2011  . Insomnia 02/03/2011  . Cigarette smoker 02/03/2011    Virgina Organ, PT CLT [phone] 09/23/2020, 12:10 PM  [facility] [address] [place], [place], [address] Phone: [phone]   Fax:  [phone]  Name: [name] MRN: [id number] Date of Birth: 24-Dec-1960

## 2020-09-24 ENCOUNTER — Ambulatory Visit: Payer: Self-pay | Admitting: Pharmacist

## 2020-09-24 DIAGNOSIS — E1142 Type 2 diabetes mellitus with diabetic polyneuropathy: Secondary | ICD-10-CM

## 2020-09-24 NOTE — Patient Instructions (Signed)
Visit Information  Goals Addressed              This Visit's Progress     Patient Stated   .  I want to manage my blood sugar (pt-stated)        CARE PLAN ENTRY (see longitudinal plan of care for additional care plan information)  Current Barriers:  . Social, financial, community barriers:  . Diabetes: U3JS; complicated by chronic medical conditions including HTN,HLD most recent A1c 7.9% . Most recent eGFR: 62 . Current antihyperglycemic regimen: trulictiy 9.61YO sq weekly, metformin, farxiga 17m o Patient notes that insurance will cover jardiance for next year o Patient with recent yeast infection since starting farxiga.  This has since then cleared up after diflucan.  Patient will continue to take FIranas prescribed.  She is currently doing well. o Will continue Trulicity 03.78HYsq weekly, patient had GI distress on 1.526msq weekly--[place] [employer] free trulicity, medication to ship to patient's house; call [phone]. Denies hypoglycemic symptoms/ denies hyperglycemic symptoms . Current exercise: n/a due to back pain . Current blood glucose readings: hasn't checked recently . Cardiovascular risk reduction: o Current hypertensive regimen: o Current hyperlipidemia regimen:  o Current antiplatelet regimen:  Pharmacist Clinical Goal(s):  . Marland Kitchenver the next 90 days, patient will work with PharmD and primary care provider to address needs related to DM management  Interventions: . Comprehensive medication review performed, medication list updated in electronic medical record . Inter-disciplinary care team collaboration (see longitudinal plan of care) . Scheduled patient appt for December 1st to review blood sugars and ensure medications are secured for next year . Discussed meal planning options and Plate method for healthy eating . Avoid sugary drinks and desserts . Incorporate balanced protein, non starchy veggies,  1 serving of carbohydrate with each meal . Increase water intake . Increase physical activity as able  Patient Self Care Activities:  . Patient will check blood glucose daily, document, and provide at future appointments . Patient will focus on medication adherence by continuing to take medications as prescribed . Patient will take medications as prescribed . Patient will contact provider with any episodes of hypoglycemia . Patient will report any questions or concerns to provider   Initial goal documentation        The patient verbalized understanding of instructions, educational materials, and care plan provided today and agreed to receive a mailed copy of patient instructions, educational materials, and care plan.   Face to Face appointment with care management team member scheduled for:  10/08/20  SIGNATURE  JuRegina EckPharmD, BCPS Clinical Pharmacist, [facility] Phone [phone]

## 2020-09-24 NOTE — Progress Notes (Signed)
Chronic Care Management   Follow Up Note   09/24/2020 Name: [name] MRN: [id number] DOB: 1961-04-09  Referred by: Janora Norlander, DO Reason for referral : chronic care management-DIABETES  [name] is a 59 y.o. year old female who is a primary care patient of Janora Norlander, DO. The CCM team was consulted for assistance with chronic disease management and care coordination needs.    Review of patient status, including review of consultants reports, relevant laboratory and other test results, and collaboration with appropriate care team members and the patient's provider was performed as part of comprehensive patient evaluation and provision of chronic care management services.    SDOH (Social Determinants of Health) assessments performed: No See Care Plan activities for detailed interventions related to [facility])     Outpatient Encounter Medications as of 09/24/2020  Medication Sig  . acetaminophen (TYLENOL) 650 MG CR tablet Take 650 mg by mouth every 8 (eight) hours as needed for pain.  Marland Kitchen albuterol (VENTOLIN HFA) 108 (90 Base) MCG/ACT inhaler Inhale 2 puffs into the lungs every 6 (six) hours as needed for wheezing or shortness of breath (for rescue).  Marland Kitchen aspirin EC 81 MG tablet Take 1 tablet (81 mg total) by mouth daily.  . Blood Glucose Monitoring Suppl (ONETOUCH VERIO) w/Device KIT Use to test blood sugar twice daily as directed; DX: E11.69  . colestipol (COLESTID) 1 g tablet Take 2 tablets (2 g total) by mouth 2 (two) times daily.  . dapagliflozin propanediol (FARXIGA) 5 MG TABS tablet Take 1 tablet (5 mg total) by mouth daily before breakfast.  . diclofenac sodium (VOLTAREN) 1 % GEL Apply 4 g topically 4 (four) times daily.  . Dulaglutide (TRULICITY) 1.49 FW/2.6VZ SOPN Inject 0.75 mg into the skin once a week. (Patient taking differently: Inject 0.75 mg into the skin once a week. On Tuesdays)  . furosemide (LASIX) 40 MG tablet TAKE 1 TABLET BY MOUTH ONCE DAILY AS  NEEDED FOR EDEMA OR  FLUID (Patient taking differently: Take 40 mg by mouth 2 (two) times daily. )  . gabapentin (NEURONTIN) 300 MG capsule TAKE 2 CAPSULES BY MOUTH ONCE DAILY IN THE MORNING THEN TAKE 2 CAPSULES ONCE DAILY AT NOON THEN TAKE 2 CAPSULES ONCE DAILY IN THE EVENING  . glimepiride (AMARYL) 2 MG tablet Take 1 tablet (2 mg total) by mouth daily with breakfast. (Needs to be seen before next refill)  . glucose blood (ONETOUCH VERIO) test strip Use to test blood sugar twice daily as directed; DX: E11.69  . metFORMIN (GLUCOPHAGE XR) 500 MG 24 hr tablet Take 2 tablets (1,000 mg total) by mouth daily with breakfast.  . metoprolol tartrate (LOPRESSOR) 25 MG tablet Take 1 tablet by mouth twice daily  . nitroGLYCERIN (NITROSTAT) 0.4 MG SL tablet Place 1 tablet (0.4 mg total) under the tongue every 5 (five) minutes as needed for chest pain.  Marland Kitchen nystatin cream (MYCOSTATIN) Apply 1 application topically 2 (two) times daily.  Marland Kitchen omeprazole (PRILOSEC) 40 MG capsule TAKE 1 CAPSULE BY MOUTH 30 MINUTES ONCE DAILY BEFORE MEAL(S)  . OneTouch Delica Lancets 85Y MISC Use to test blood sugar twice daily as directed; DX: E11.69  . OneTouch Delica Lancets 85O MISC Check blood sugar BID and prn  E11.9  . rosuvastatin (CRESTOR) 20 MG tablet Take 1 tablet (20 mg total) by mouth at bedtime.  Marland Kitchen telmisartan (MICARDIS) 40 MG tablet Take 1 tablet (40 mg total) by mouth daily.  . [DISCONTINUED] omeprazole (PRILOSEC) 40 MG  capsule TAKE 1 CAPSULE BY MOUTH ONCE DAILY 30  MINUTES  BEFORE  A  MEAL   No facility-administered encounter medications on file as of 09/24/2020.     Objective:   Goals Addressed              This Visit's Progress     Patient Stated   .  I want to manage my blood sugar (pt-stated)        CARE PLAN ENTRY (see longitudinal plan of care for additional care plan information)  Current Barriers:  . Social, financial, community barriers:  . Diabetes: P1GF; complicated by chronic medical  conditions including HTN,HLD most recent A1c 7.9% . Most recent eGFR: 62 . Current antihyperglycemic regimen: trulictiy 8.42JI sq weekly, metformin, farxiga 69m o Patient notes that insurance will cover jardiance for next year o Patient with recent yeast infection since starting farxiga.  This has since then cleared up after diflucan.  Patient will continue to take FIranas prescribed.  She is currently doing well. o Will continue Trulicity 03.12OFsq weekly, patient had GI distress on 1.531msq weekly--[place] [employer] free [place]ul[place], medication to ship to patient's house; call [phone]. Denies hypoglycemic symptoms/ denies hyperglycemic symptoms . Current exercise: n/a due to back pain . Current blood glucose readings: hasn't checked recently . Cardiovascular risk reduction: o Current hypertensive regimen: o Current hyperlipidemia regimen:  o Current antiplatelet regimen:  Pharmacist Clinical Goal(s):  . Marland Kitchenver the next 90 days, patient will work with PharmD and primary care provider to address needs related to DM management  Interventions: . Comprehensive medication review performed, medication list updated in electronic medical record . Inter-disciplinary care team collaboration (see longitudinal plan of care) . Scheduled patient appt for December 1st to review blood sugars and ensure medications are secured for next year . Discussed meal planning options and Plate method for healthy eating . Avoid sugary drinks and desserts . Incorporate balanced protein, non starchy veggies, 1 serving of carbohydrate with each meal . Increase water intake . Increase physical activity as able  Patient Self Care Activities:  . Patient will check blood glucose daily, document, and provide at future appointments . Patient will focus on medication adherence by continuing to take medications as prescribed . Patient will take medications as  prescribed . Patient will contact provider with any episodes of hypoglycemia . Patient will report any questions or concerns to provider   Initial goal documentation         Plan:   Face to Face appointment with care management team member scheduled for:  10/08/20   JuRegina EckPharmD, BCPS Clinical Pharmacist, [facility] Phone [phone]

## 2020-09-25 ENCOUNTER — Encounter (HOSPITAL_COMMUNITY): Payer: Self-pay | Admitting: Physical Therapy

## 2020-09-25 ENCOUNTER — Telehealth: Payer: Self-pay | Admitting: Family Medicine

## 2020-09-25 ENCOUNTER — Telehealth (HOSPITAL_COMMUNITY): Payer: Self-pay | Admitting: Physical Therapy

## 2020-09-25 ENCOUNTER — Ambulatory Visit (HOSPITAL_COMMUNITY): Payer: Medicare Other | Admitting: Physical Therapy

## 2020-09-25 DIAGNOSIS — E1142 Type 2 diabetes mellitus with diabetic polyneuropathy: Secondary | ICD-10-CM

## 2020-09-25 MED ORDER — GABAPENTIN 300 MG PO CAPS: ORAL_CAPSULE | ORAL | 0 refills | Status: DC

## 2020-09-25 NOTE — Telephone Encounter (Signed)
  Prescription Request  09/25/2020  What is the name of the medication or equipment? Gabapentin  Have you contacted your pharmacy to request a refill? (if applicable) yes  Which pharmacy would you like this sent to? [facility]   Patient notified that their request is being sent to the clinical staff for review and that they should receive a response within 2 business days.

## 2020-09-25 NOTE — Progress Notes (Signed)
[name], thank you for letting us know

## 2020-09-25 NOTE — Therapy (Signed)
[place], [place], [address] Phone: [phone]   Fax:  [phone]  Patient Details  Name: DEMIAH GULLICKSON MRN: [id number] Date of Birth: [birth year] Referring Provider: Levell July  Encounter Date: 09/25/2020   PT has appointment for her first lymphedema treatment post evaluation.  PT cancelled and stated to cancel all future visits as her dad has just died and she is not able to cope with everything going on.  Therapist called pt to let her know that she will at least order the compression pump for her to assist in controlling her swelling.   PHYSICAL THERAPY DISCHARGE SUMMARY  Visits from Start of Care: 1  Current functional level related to goals / functional outcomes: Increased edema B LE affecting mobility    Remaining deficits: same   Education / Equipment: What lymphedema is and how it is controlled   Plan: Patient agrees to discharge.  Patient goals were not met. Patient is being discharged due to the patient's request.  ?????     Rayetta Humphrey, PT CLT [phone] 09/25/2020, 11:10 AM  [place], [place], [address] Phone: [phone]   Fax:  [phone]

## 2020-09-25 NOTE — Telephone Encounter (Signed)
Pt aware refill sent to pharmacy 

## 2020-09-25 NOTE — Telephone Encounter (Signed)
S/w pt she wants to cx all future apptments and request to be d/c. She will get another MD order when she wants to return. He father just died.

## 2020-09-26 ENCOUNTER — Ambulatory Visit (HOSPITAL_COMMUNITY): Payer: Medicare Other | Admitting: Physical Therapy

## 2020-09-29 ENCOUNTER — Ambulatory Visit (HOSPITAL_COMMUNITY): Payer: Medicare Other | Admitting: Physical Therapy

## 2020-10-01 ENCOUNTER — Ambulatory Visit (HOSPITAL_COMMUNITY): Payer: Medicare Other

## 2020-10-07 ENCOUNTER — Encounter (HOSPITAL_COMMUNITY): Payer: Medicare Other | Admitting: Physical Therapy

## 2020-10-08 ENCOUNTER — Ambulatory Visit: Payer: Self-pay | Admitting: Pharmacist

## 2020-10-08 DIAGNOSIS — E1142 Type 2 diabetes mellitus with diabetic polyneuropathy: Secondary | ICD-10-CM

## 2020-10-08 MED ORDER — CLOTRIMAZOLE 1 % EX CREA: TOPICAL_CREAM | CUTANEOUS | 0 refills | Status: DC

## 2020-10-08 MED ORDER — FLUCONAZOLE 150 MG PO TABS: ORAL_TABLET | ORAL | 0 refills | Status: DC

## 2020-10-08 NOTE — Patient Instructions (Signed)
Visit Information  Goals Addressed              This Visit's Progress     Patient Stated   .  I want to manage my blood sugar (pt-stated)        CARE PLAN ENTRY (see longitudinal plan of care for additional care plan information)  Current Barriers:  . Social, financial, community barriers:  . Diabetes: H3ZJ; complicated by chronic medical conditions including HTN,HLD most recent A1c 7.9% . Most recent eGFR: 62 . Current antihyperglycemic regimen: trulictiy 6.96VE sq weekly, metformin 1g BID, farxiga 84m, glimepiride 250mo Patient notes that insurance will cover jardiance for next year o Patient with recent yeast infections since starting farxiga.  She is experiencing additional burning/itching.  Will STOP farxiga o Will continue Trulicity 0.9.38BOq weekly, patient had GI distress on 1.12m23mq weekly--patient currently enrolled in [employer] assistance program for free trulicity, medication to ship to patient's house; call [phone]>will re-enroll for 2022 . Denies hypoglycemic symptoms/ denies hyperglycemic symptoms . Current exercise: n/a due to back pain . Current blood glucose readings: hasn't checked recently . Cardiovascular risk reduction: o Current hypertensive regimen: metoprolol, telmisartan o Current hyperlipidemia regimen: rosuvastatin 88m49mCurrent antiplatelet regimen: ASA 81mg59marmacist Clinical Goal(s):  . OveMarland Kitchen the next 90 days, patient will work with PharmD and primary care provider to address needs related to DM management  Interventions: . Comprehensive medication review performed, medication list updated in electronic medical record . Inter-disciplinary care team collaboration (see longitudinal plan of care) . Scheduled patient appt for December 6th to review blood sugars and ensure medications are secured for next year . Clotrimazole external cream/Fluconazole RXs sent to pharmacy to help relieve patient symptoms-->send to PCP for cosign .  Encouraged patient to watch blood sugar, increase water intake-->instructed patient to hold farxiga.  We will add insulin.  Patient to come in for instruction/teaching on 10/13/20. I will follow up with patient next week.   . Discussed adding insulin at next visit  . Discussed meal planning options and Plate method for healthy eating . Avoid sugary drinks and desserts . Incorporate balanced protein, non starchy veggies, 1 serving of carbohydrate with each meal . Increase water intake . Increase physical activity as able  Patient Self Care Activities:  . Patient will check blood glucose daily, document, and provide at future appointments . Patient will focus on medication adherence by continuing to take medications as prescribed . Patient will take medications as prescribed . Patient will contact provider with any episodes of hypoglycemia . Patient will report any questions or concerns to provider   Please see past updates related to this goal by clicking on the "Past Updates" button in the selected goal         The patient verbalized understanding of instructions, educational materials, and care plan provided today and declined offer to receive copy of patient instructions, educational materials, and care plan.   Face to Face appointment with CCM PHARMD scheduled for: 10/13/20  SIGNATURE JulieRegina EckrmD, BCPS Clinical Pharmacist, [place] [phone]

## 2020-10-08 NOTE — Progress Notes (Signed)
Chronic Care Management   Follow Up Note  DIABETES   10/08/2020 Name: [name] MRN: [id number] DOB: 07/07/1961  Referred by: Janora Norlander, DO Reason for referral : Chronic care management-Diabetes   [name] is a 59 y.o. year old female who is a primary care patient of Janora Norlander, DO. The CCM team was consulted for assistance with chronic disease management and care coordination needs.    Review of patient status, including review of consultants reports, relevant laboratory and other test results, and collaboration with appropriate care team members and the patient's provider was performed as part of comprehensive patient evaluation and provision of chronic care management services.    SDOH (Social Determinants of Health) assessments performed: No See Care Plan activities for detailed interventions related to [facility])     Outpatient Encounter Medications as of 10/08/2020  Medication Sig  . acetaminophen (TYLENOL) 650 MG CR tablet Take 650 mg by mouth every 8 (eight) hours as needed for pain.  Marland Kitchen albuterol (VENTOLIN HFA) 108 (90 Base) MCG/ACT inhaler Inhale 2 puffs into the lungs every 6 (six) hours as needed for wheezing or shortness of breath (for rescue).  Marland Kitchen aspirin EC 81 MG tablet Take 1 tablet (81 mg total) by mouth daily.  . Blood Glucose Monitoring Suppl (ONETOUCH VERIO) w/Device KIT Use to test blood sugar twice daily as directed; DX: E11.69  . colestipol (COLESTID) 1 g tablet Take 2 tablets (2 g total) by mouth 2 (two) times daily.  . dapagliflozin propanediol (FARXIGA) 5 MG TABS tablet Take 1 tablet (5 mg total) by mouth daily before breakfast.  . diclofenac sodium (VOLTAREN) 1 % GEL Apply 4 g topically 4 (four) times daily.  . Dulaglutide (TRULICITY) 2.68 TM/1.9QQ SOPN Inject 0.75 mg into the skin once a week. (Patient taking differently: Inject 0.75 mg into the skin once a week. On Tuesdays)  . fluconazole (DIFLUCAN) 150 MG tablet Take 1 tablet (146m) by  mouth on day 1.  Repeat on Day 3 if symptoms persist.  . furosemide (LASIX) 40 MG tablet TAKE 1 TABLET BY MOUTH ONCE DAILY AS NEEDED FOR EDEMA OR  FLUID (Patient taking differently: Take 40 mg by mouth 2 (two) times daily. )  . gabapentin (NEURONTIN) 300 MG capsule TAKE 2 CAPSULES BY MOUTH ONCE DAILY IN THE MORNING THEN TAKE 2 CAPSULES ONCE DAILY AT NOON THEN TAKE 2 CAPSULES ONCE DAILY IN THE EVENING  . glimepiride (AMARYL) 2 MG tablet Take 1 tablet (2 mg total) by mouth daily with breakfast. (Needs to be seen before next refill)  . glucose blood (ONETOUCH VERIO) test strip Use to test blood sugar twice daily as directed; DX: E11.69  . metFORMIN (GLUCOPHAGE XR) 500 MG 24 hr tablet Take 2 tablets (1,000 mg total) by mouth daily with breakfast.  . metoprolol tartrate (LOPRESSOR) 25 MG tablet Take 1 tablet by mouth twice daily  . nitroGLYCERIN (NITROSTAT) 0.4 MG SL tablet Place 1 tablet (0.4 mg total) under the tongue every 5 (five) minutes as needed for chest pain.  .Marland Kitchennystatin cream (MYCOSTATIN) Apply 1 application topically 2 (two) times daily.  .Marland Kitchenomeprazole (PRILOSEC) 40 MG capsule TAKE 1 CAPSULE BY MOUTH 30 MINUTES ONCE DAILY BEFORE MEAL(S)  . OneTouch Delica Lancets 322LMISC Use to test blood sugar twice daily as directed; DX: E11.69  . OneTouch Delica Lancets 379GMISC Check blood sugar BID and prn  E11.9  . rosuvastatin (CRESTOR) 20 MG tablet Take 1 tablet (20 mg total)  by mouth at bedtime.  Marland Kitchen telmisartan (MICARDIS) 40 MG tablet Take 1 tablet (40 mg total) by mouth daily.  . [DISCONTINUED] omeprazole (PRILOSEC) 40 MG capsule TAKE 1 CAPSULE BY MOUTH ONCE DAILY 30  MINUTES  BEFORE  A  MEAL   No facility-administered encounter medications on file as of 10/08/2020.     Objective:   Goals Addressed              This Visit's Progress     Patient Stated   .  I want to manage my blood sugar (pt-stated)        CARE PLAN ENTRY (see longitudinal plan of care for additional care plan  information)  Current Barriers:  . Social, financial, community barriers:  . Diabetes: V5IE; complicated by chronic medical conditions including HTN,HLD most recent A1c 7.9% . Most recent eGFR: 62 . Current antihyperglycemic regimen: trulictiy 3.32RJ sq weekly, metformin 1g BID, farxiga 59m, glimepiride 264mo Patient notes that insurance will cover jardiance for next year o Patient with recent yeast infections since starting farxiga.  She is experiencing additional burning/itching.  Will STOP farxiga o Will continue Trulicity 0.1.88CZq weekly, patient had GI distress on 1.70m31mq weekly--patient currently enrolled in [employer] assistance program for free trulicity, medication to ship to patient's house; call [phone]>will re-enroll for 2022 . Denies hypoglycemic symptoms/ denies hyperglycemic symptoms . Current exercise: n/a due to back pain . Current blood glucose readings: hasn't checked recently . Cardiovascular risk reduction: o Current hypertensive regimen: metoprolol, telmisartan o Current hyperlipidemia regimen: rosuvastatin 66m58mCurrent antiplatelet regimen: ASA 81mg21marmacist Clinical Goal(s):  . OveMarland Kitchen the next 90 days, patient will work with PharmD and primary care provider to address needs related to DM management  Interventions: . Comprehensive medication review performed, medication list updated in electronic medical record . Inter-disciplinary care team collaboration (see longitudinal plan of care) . Scheduled patient appt for December 6th to review blood sugars and ensure medications are secured for next year . Clotrimazole external cream/Fluconazole RXs sent to pharmacy to help relieve patient symptoms-->send to PCP for cosign . Encouraged patient to watch blood sugar, increase water intake-->instructed patient to hold farxiga.  We will add insulin.  Patient to come in for instruction/teaching on 10/13/20. I will follow up with patient next week.    . Discussed adding insulin at next visit  . Discussed meal planning options and Plate method for healthy eating . Avoid sugary drinks and desserts . Incorporate balanced protein, non starchy veggies, 1 serving of carbohydrate with each meal . Increase water intake . Increase physical activity as able  Patient Self Care Activities:  . Patient will check blood glucose daily, document, and provide at future appointments . Patient will focus on medication adherence by continuing to take medications as prescribed . Patient will take medications as prescribed . Patient will contact provider with any episodes of hypoglycemia . Patient will report any questions or concerns to provider   Please see past updates related to this goal by clicking on the "Past Updates" button in the selected goal          Plan:   Face to Face appointment with CCM PHARMD scheduled for: 10/13/20   SIGNATURE  JulieRegina EckrmD, BCPS Clinical Pharmacist, [place] [phone]

## 2020-10-09 ENCOUNTER — Encounter (HOSPITAL_COMMUNITY): Payer: Medicare Other

## 2020-10-10 ENCOUNTER — Ambulatory Visit (HOSPITAL_COMMUNITY): Payer: Medicare Other

## 2020-10-13 ENCOUNTER — Encounter (HOSPITAL_COMMUNITY): Payer: Medicare Other | Admitting: Physical Therapy

## 2020-10-13 ENCOUNTER — Telehealth: Payer: Self-pay

## 2020-10-13 NOTE — Telephone Encounter (Signed)
appt scheduled for 12/8 at 10:30am

## 2020-10-15 ENCOUNTER — Ambulatory Visit: Payer: Self-pay

## 2020-10-15 ENCOUNTER — Encounter (HOSPITAL_COMMUNITY): Payer: Medicare Other

## 2020-10-16 ENCOUNTER — Encounter (HOSPITAL_COMMUNITY): Payer: Medicare Other

## 2020-10-17 ENCOUNTER — Encounter (HOSPITAL_COMMUNITY): Payer: Medicare Other

## 2020-10-21 ENCOUNTER — Encounter (HOSPITAL_COMMUNITY): Payer: Medicare Other | Admitting: Physical Therapy

## 2020-10-22 ENCOUNTER — Ambulatory Visit: Payer: Self-pay

## 2020-10-23 ENCOUNTER — Other Ambulatory Visit: Payer: Self-pay | Admitting: Family Medicine

## 2020-10-23 ENCOUNTER — Encounter (HOSPITAL_COMMUNITY): Payer: Medicare Other

## 2020-10-23 DIAGNOSIS — E1142 Type 2 diabetes mellitus with diabetic polyneuropathy: Secondary | ICD-10-CM

## 2020-10-23 DIAGNOSIS — I152 Hypertension secondary to endocrine disorders: Secondary | ICD-10-CM

## 2020-10-23 DIAGNOSIS — E1159 Type 2 diabetes mellitus with other circulatory complications: Secondary | ICD-10-CM

## 2020-10-24 ENCOUNTER — Ambulatory Visit (HOSPITAL_COMMUNITY): Payer: Medicare Other

## 2020-10-28 ENCOUNTER — Encounter (HOSPITAL_COMMUNITY): Payer: Medicare Other

## 2020-10-30 ENCOUNTER — Encounter (HOSPITAL_COMMUNITY): Payer: Medicare Other

## 2020-11-03 ENCOUNTER — Ambulatory Visit (INDEPENDENT_AMBULATORY_CARE_PROVIDER_SITE_OTHER): Payer: Medicare Other | Admitting: Pharmacist

## 2020-11-03 ENCOUNTER — Other Ambulatory Visit: Payer: Self-pay

## 2020-11-03 DIAGNOSIS — E119 Type 2 diabetes mellitus without complications: Secondary | ICD-10-CM

## 2020-11-03 NOTE — Progress Notes (Signed)
    11/03/2020 Name: [name] MRN: [id number] DOB: [birth year]   S:  34 yoF Presents for diabetes evaluation, education, and management.  Patient has recently discontinued her GLP1 due to GI adverse events even at low doses.  She has also experienced grief/stress due to the loss of family members. Patient was referred and last seen by Primary Care Provider on 09/17/20  Insurance coverage/medication affordability: [facility] medicare  Patient reports adherence with medications. . Current diabetes medications include: metformin, glimepiride o Tried/failed-sglt2, glp1 . Current hypertension medications include: metop, telmisartan Goal 130/80 . Current hyperlipidemia medications include: crestor  O:  Lab Results  Component Value Date   HGBA1C 7.9 (H) 08/08/2020     Lipid Panel     Component Value Date/Time   CHOL 91 (L) 08/08/2020 1035   TRIG 134 08/08/2020 1035   TRIG 211 (H) 12/30/2014 1537   HDL 36 (L) 08/08/2020 1035   HDL 48 12/30/2014 1537   CHOLHDL 2.5 08/08/2020 1035   CHOLHDL 4.5 04/09/2013 1135   VLDL 37 04/09/2013 1135   LDLCALC 31 08/08/2020 1035   LDLCALC 42 08/28/2014 1037     Home fasting blood sugars: 200-300  2 hour post-meal/random blood sugars: n/a.   The ASCVD Risk score Denman George DC Jr., et al., 2013) failed to calculate for the following reasons:   The valid total cholesterol range is 130 to 320 mg/dL    A/P:  Diabetes H0WC currently uncontrolled.  Control is suboptimal due to diet/medication intolerances.  -STOPPED trulicity due to GI adverse events  -Continue metformin (may increased as tolerated, but staying at current dose due to GI)  -New start insulin-->Basaglar started at 14 units nightly (based on patient assistance program)  Application for lilly cares submitted for basaglar  Sample given for patient to start tonight  -Continue glimepiride for now  -Extensively discussed pathophysiology of diabetes, recommended lifestyle interventions,  dietary effects on blood sugar control  -Counseled on s/sx of and management of hypoglycemia   Written patient instructions provided.  Total time in face to face counseling 25 minutes.   Follow up PCP Clinic Visit ON 11/05/20.    Kieth Brightly, PharmD, BCPS Clinical Pharmacist, [facility]  II Phone [phone]

## 2020-11-04 ENCOUNTER — Encounter (HOSPITAL_COMMUNITY): Payer: Medicare Other | Admitting: Physical Therapy

## 2020-11-05 ENCOUNTER — Ambulatory Visit (INDEPENDENT_AMBULATORY_CARE_PROVIDER_SITE_OTHER): Payer: Medicare Other | Admitting: Family Medicine

## 2020-11-05 ENCOUNTER — Other Ambulatory Visit: Payer: Self-pay

## 2020-11-05 VITALS — BP 121/69 | HR 71 | Temp 97.2°F | Ht 62.0 in | Wt 311.0 lb

## 2020-11-05 DIAGNOSIS — E1159 Type 2 diabetes mellitus with other circulatory complications: Secondary | ICD-10-CM | POA: Diagnosis not present

## 2020-11-05 DIAGNOSIS — E1142 Type 2 diabetes mellitus with diabetic polyneuropathy: Secondary | ICD-10-CM

## 2020-11-05 DIAGNOSIS — F17210 Nicotine dependence, cigarettes, uncomplicated: Secondary | ICD-10-CM

## 2020-11-05 DIAGNOSIS — E1169 Type 2 diabetes mellitus with other specified complication: Secondary | ICD-10-CM | POA: Diagnosis not present

## 2020-11-05 DIAGNOSIS — E785 Hyperlipidemia, unspecified: Secondary | ICD-10-CM

## 2020-11-05 DIAGNOSIS — R519 Headache, unspecified: Secondary | ICD-10-CM

## 2020-11-05 DIAGNOSIS — G4733 Obstructive sleep apnea (adult) (pediatric): Secondary | ICD-10-CM

## 2020-11-05 DIAGNOSIS — I152 Hypertension secondary to endocrine disorders: Secondary | ICD-10-CM

## 2020-11-05 DIAGNOSIS — D696 Thrombocytopenia, unspecified: Secondary | ICD-10-CM

## 2020-11-05 LAB — BAYER DCA HB A1C WAIVED: HB A1C (BAYER DCA - WAIVED): 9.1 % — ABNORMAL HIGH (ref <7.0)

## 2020-11-05 MED ORDER — METOPROLOL TARTRATE 25 MG PO TABS: 25.0000 mg | ORAL_TABLET | Freq: Two times a day (BID) | ORAL | 3 refills | Status: DC

## 2020-11-05 MED ORDER — GABAPENTIN 600 MG PO TABS: 600.0000 mg | ORAL_TABLET | Freq: Three times a day (TID) | ORAL | 3 refills | Status: DC

## 2020-11-05 MED ORDER — METFORMIN HCL ER 500 MG PO TB24
ORAL_TABLET | ORAL | 3 refills | Status: DC
Start: 2020-11-05 — End: 2021-12-10

## 2020-11-05 MED ORDER — GLIMEPIRIDE 2 MG PO TABS: ORAL_TABLET | ORAL | 3 refills | Status: DC

## 2020-11-05 NOTE — Patient Instructions (Addendum)
I changed your gabapentin so you do not have to take 2 capsules 3 times a day.  You can take 1 tablet 3 times a day.  This is the 600 mg tablet.  You had labs performed today.  You will be contacted with the results of the labs once they are available, usually in the next 3 business days for routine lab work.  If you have an active my chart account, they will be released to your MyChart.  If you prefer to have these labs released to you via telephone, please let us know.  If you had a pap smear or biopsy performed, expect to be contacted in about 7-10 days.  Sugar has gone up quite a bit since her last visit.  Her A1c is up to 9.1.  However, I do think it will get better with the Basaglar.  Continue using it at current dose.  We will talk again in the next 2 to 3 weeks for review of your sugar log.  Please keep a detailed log for me.  Start back using your CPAP machine.

## 2020-11-05 NOTE — Progress Notes (Signed)
Subjective: CC: DM PCP: Janora Norlander, DO [name] is a 59 y.o. female presenting to clinic today for:  1. Type 2 Diabetes with hypertension, hyperlipidemia; OSA with CPAP:  Could not tolerate Trulicity.  Metformin, glimepiride were continued.  She was started on basaglar 14 u on 11/03/20. Continued on Micardis and crestor.  She reports compliance with the Basaglar.  So far her blood sugars do seem to be responding.  Last evening's blood sugar was in the 200s this morning she was in the 130s.  The some lows that she seen in quite some time.  She admits over the last couple of days she has had a morning headache.  She does have sleep apnea but does not use her CPAP machine because it often falls off at night.  No chest pain, shortness of breath.  Last eye exam: needs Last foot exam: UTD Last A1c:  Lab Results  Component Value Date   HGBA1C 7.9 (H) 08/08/2020   Nephropathy screen indicated?: on ARB Last flu, zoster and/or pneumovax:  Immunization History  Administered Date(s) Administered  . Influenza,inj,Quad PF,6+ Mos 08/10/2013, 08/28/2014, 09/25/2015, 11/23/2017, 11/27/2018  . Pneumococcal Conjugate-13 11/27/2018    2.  Lymphedema She had some difficulty with the lymphedema pump recently for her legs.  She notes that she was only able to stay on the pump for about 30 minutes before having severe cramping in her legs.  The following day her legs felt bruised and fatigued.  She has not reached out to her specialist yet about this.  ROS: Per HPI  Allergies  Allergen Reactions  . Farxiga [Dapagliflozin]     RECURRENT YEAST INFECTIONS  . Morphine And Related Nausea And Vomiting   Past Medical History:  Diagnosis Date  . Arthritis   . CAD (coronary artery disease)    BMS to circumflex 2007 - Dr. Olevia Perches  . CKD (chronic kidney disease) stage 3, GFR 30-59 ml/min (HCC)   . Essential hypertension   . Falls frequently   . GERD (gastroesophageal reflux disease)   .  HA (headache)   . Hyperlipidemia   . Left-sided face pain   . Morbid obesity ([place])   . Noncompliance   . OSA (obstructive sleep apnea)    Uses CPAP  . Type 2 diabetes mellitus ([place])   . Urinary incontinence     Current Outpatient Medications:  .  acetaminophen (TYLENOL) 650 MG CR tablet, Take 650 mg by mouth every 8 (eight) hours as needed for pain., Disp: , Rfl:  .  albuterol (VENTOLIN HFA) 108 (90 Base) MCG/ACT inhaler, Inhale 2 puffs into the lungs every 6 (six) hours as needed for wheezing or shortness of breath (for rescue)., Disp: 1 each, Rfl: 0 .  aspirin EC 81 MG tablet, Take 1 tablet (81 mg total) by mouth daily., Disp: , Rfl:  .  Blood Glucose Monitoring Suppl (ONETOUCH VERIO) w/Device KIT, Use to test blood sugar twice daily as directed; DX: E11.69, Disp: 1 kit, Rfl: 1 .  clotrimazole (LOTRIMIN) 1 % cream, Apply a small amount of cream to itchy, irritated skin outside the vagina twice daily for up to 7 days, Disp: 30 g, Rfl: 0 .  colestipol (COLESTID) 1 g tablet, Take 2 tablets (2 g total) by mouth 2 (two) times daily., Disp: 120 tablet, Rfl: 4 .  diclofenac sodium (VOLTAREN) 1 % GEL, Apply 4 g topically 4 (four) times daily., Disp: 400 g, Rfl: 2 .  fluconazole (DIFLUCAN) 150 MG tablet,  Take 1 tablet (169m) by mouth on day 1.  Repeat on Day 3 if symptoms persist., Disp: 2 tablet, Rfl: 0 .  furosemide (LASIX) 40 MG tablet, TAKE 1 TABLET BY MOUTH ONCE DAILY AS NEEDED FOR EDEMA OR  FLUID (Patient taking differently: Take 40 mg by mouth 2 (two) times daily. ), Disp: 90 tablet, Rfl: 0 .  gabapentin (NEURONTIN) 300 MG capsule, TAKE 2 CAPSULES BY MOUTH IN THE MORNING AND 2 AT NOON AND 2 IN THE EVENING, Disp: 180 capsule, Rfl: 0 .  glimepiride (AMARYL) 2 MG tablet, TAKE 1 TABLET BY MOUTH ONCE DAILY WITH BREAKFAST . APPOINTMENT REQUIRED FOR FUTURE REFILLS, Disp: 30 tablet, Rfl: 0 .  glucose blood (ONETOUCH VERIO) test strip, Use to test blood sugar twice daily as directed; DX: E11.69, Disp:  100 each, Rfl: 5 .  metFORMIN (GLUCOPHAGE-XR) 500 MG 24 hr tablet, TAKE 2 TABLETS BY MOUTH ONCE DAILY WITH BREAKFAST, Disp: 60 tablet, Rfl: 0 .  metoprolol tartrate (LOPRESSOR) 25 MG tablet, Take 1 tablet by mouth twice daily, Disp: 60 tablet, Rfl: 0 .  nitroGLYCERIN (NITROSTAT) 0.4 MG SL tablet, Place 1 tablet (0.4 mg total) under the tongue every 5 (five) minutes as needed for chest pain., Disp: 25 tablet, Rfl: 3 .  nystatin cream (MYCOSTATIN), Apply 1 application topically 2 (two) times daily., Disp: 30 g, Rfl: 0 .  omeprazole (PRILOSEC) 40 MG capsule, TAKE 1 CAPSULE BY MOUTH 30 MINUTES ONCE DAILY BEFORE MEAL(S), Disp: 90 capsule, Rfl: 3 .  OneTouch Delica Lancets 318AMISC, Use to test blood sugar twice daily as directed; DX: E11.69, Disp: 100 each, Rfl: 3 .  OneTouch Delica Lancets 341YMISC, Check blood sugar BID and prn  E11.9, Disp: 100 each, Rfl: 11 .  rosuvastatin (CRESTOR) 20 MG tablet, Take 1 tablet (20 mg total) by mouth at bedtime., Disp: 90 tablet, Rfl: 1 .  telmisartan (MICARDIS) 40 MG tablet, Take 1 tablet (40 mg total) by mouth daily., Disp: 90 tablet, Rfl: 0 Social History   Socioeconomic History  . Marital status: Married    Spouse name: WGwyndolyn Saxon . Number of children: 5  . Years of education: 157th  . Highest education level: 10th grade  Occupational History  . Occupation: disabled     Comment: disabled  Tobacco Use  . Smoking status: Current Every Day Smoker    Packs/day: 1.00    Years: 47.00    Pack years: 47.00    Types: Cigarettes    Start date: 05/14/1972  . Smokeless tobacco: Never Used  Vaping Use  . Vaping Use: Never used  Substance and Sexual Activity  . Alcohol use: No    Alcohol/week: 0.0 standard drinks  . Drug use: No  . Sexual activity: Not Currently    Birth control/protection: Surgical  Other Topics Concern  . Not on file  Social History Narrative   Patient lives at home with her husband and grandchild (Gwyndolyn Saxon. Patient is disabled.   Patient  has 10 th grade education.   Right handed.   Caffeine- one  cup of coffee and  One soda Dr.Pepper/ tea. daily   Social Determinants of Health   Financial Resource Strain: Medium Risk  . Difficulty of Paying Living Expenses: Somewhat hard  Food Insecurity: No Food Insecurity  . Worried About RCharity fundraiserin the Last Year: Never true  . Ran Out of Food in the Last Year: Never true  Transportation Needs: No Transportation Needs  . Lack of  Transportation (Medical): No  . Lack of Transportation (Non-Medical): No  Physical Activity: Inactive  . Days of Exercise per Week: 0 days  . Minutes of Exercise per Session: 0 min  Stress: Stress Concern Present  . Feeling of Stress : To some extent  Social Connections: Moderately Isolated  . Frequency of Communication with Friends and Family: More than three times a week  . Frequency of Social Gatherings with Friends and Family: More than three times a week  . Attends Religious Services: Never  . Active Member of Clubs or Organizations: No  . Attends Archivist Meetings: Never  . Marital Status: Married  Human resources officer Violence: Not At Risk  . Fear of Current or Ex-Partner: No  . Emotionally Abused: No  . Physically Abused: No  . Sexually Abused: No   Family History  Problem Relation Age of Onset  . Coronary artery disease Father   . Cancer - Colon Father   . Diabetes Father   . High Cholesterol Father   . Breast cancer Mother   . Arthritis Sister   . Asthma Sister   . High Cholesterol Daughter   . Thyroid disease Daughter   . Hiatal hernia Son   . Congenital heart disease Son     Objective: Office vital signs reviewed. BP 121/69   Pulse 71   Temp (!) 97.2 F (36.2 C) (Temporal)   Ht _0  (1.575 m)   Wt (!) 311 lb (141.1 kg)   SpO2 95%   BMI 56.88 kg/m   Physical Examination:  General: Awake, alert, chronically ill appearing female, No acute distress HEENT: Normal; sclera white Cardio: regular rate and  rhythm, S1S2 heard, no murmurs appreciated Pulm: clear to auscultation bilaterally, no wheezes, rhonchi or rales; normal work of breathing on room air Extremities: Lymphedema bilateral lower extremities noted.  No splitting of skin. MSK: Slow, antalgic gait  Assessment/ Plan: 59 y.o. female   Type 2 diabetes mellitus with diabetic polyneuropathy, without long-term current use of insulin (HCC) - Plan: Bayer DCA Hb A1c Waived, glimepiride (AMARYL) 2 MG tablet  Hypertension associated with diabetes ([place]) - Plan: metoprolol tartrate (LOPRESSOR) 25 MG tablet  Hyperlipidemia associated with type 2 diabetes mellitus (HCC)  Morbid (severe) obesity due to excess calories (HCC)  Thrombocytopenia ([place]) - Plan: CBC with Differential  Cigarette smoker  OSA (obstructive sleep apnea)  Morning headache  A1c climbing but she is only had a couple of days on the insulin.  Based on her blood sugars that she is reporting I think that this will get her to goal.  I would like to chat with her another couple of weeks and review of blood sugar log.  We will plan to adjust Basaglar pending those readings  As far as her headache goes, her blood pressure is well controlled.  She does not have any evidence of dehydration.  She does have untreated sleep apnea at this point.  I reinforced need to go back on her CPAP machine even if she is only able to tolerate a few days per week.  We discussed the risks of CVA, MI.     I will reach out to her lymphedema clinic to see if they have any other instructions with regards to the lymphatic pumps.  I advised her to use it only for maybe 10 minutes at a time.  I am not sure if there are any signs that might be adjusted but I will reach out to see if they  have recommendations   No orders of the defined types were placed in this encounter.  Meds ordered this encounter  Medications  . glimepiride (AMARYL) 2 MG tablet    Sig: TAKE 1 TABLET BY MOUTH ONCE DAILY WITH BREAKFAST  .    Dispense:  90 tablet    Refill:  3  . gabapentin (NEURONTIN) 600 MG tablet    Sig: Take 1 tablet (600 mg total) by mouth 3 (three) times daily.    Dispense:  270 tablet    Refill:  3  . metFORMIN (GLUCOPHAGE-XR) 500 MG 24 hr tablet    Sig: TAKE 2 TABLETS BY MOUTH ONCE DAILY WITH BREAKFAST    Dispense:  180 tablet    Refill:  3  . metoprolol tartrate (LOPRESSOR) 25 MG tablet    Sig: Take 1 tablet (25 mg total) by mouth 2 (two) times daily.    Dispense:  180 tablet    Refill:  [place], [place] [phone]

## 2020-11-06 ENCOUNTER — Encounter (HOSPITAL_COMMUNITY): Payer: Medicare Other | Admitting: Physical Therapy

## 2020-11-06 LAB — CBC WITH DIFFERENTIAL/PLATELET
Basophils Absolute: 0.1 10*3/uL (ref 0.0–0.2)
Basos: 1 %
EOS (ABSOLUTE): 0.6 10*3/uL — ABNORMAL HIGH (ref 0.0–0.4)
Eos: 7 %
Hematocrit: 42.9 % (ref 34.0–46.6)
Hemoglobin: 13.6 g/dL (ref 11.1–15.9)
Immature Grans (Abs): 0 10*3/uL (ref 0.0–0.1)
Immature Granulocytes: 0 %
Lymphocytes Absolute: 3.4 10*3/uL — ABNORMAL HIGH (ref 0.7–3.1)
Lymphs: 40 %
MCH: 30.2 pg (ref 26.6–33.0)
MCHC: 31.7 g/dL (ref 31.5–35.7)
MCV: 95 fL (ref 79–97)
Monocytes Absolute: 0.5 10*3/uL (ref 0.1–0.9)
Monocytes: 6 %
Neutrophils Absolute: 3.9 10*3/uL (ref 1.4–7.0)
Neutrophils: 46 %
Platelets: 117 10*3/uL — ABNORMAL LOW (ref 150–450)
RBC: 4.51 x10E6/uL (ref 3.77–5.28)
RDW: 15.8 % — ABNORMAL HIGH (ref 11.7–15.4)
WBC: 8.4 10*3/uL (ref 3.4–10.8)

## 2020-11-11 ENCOUNTER — Other Ambulatory Visit: Payer: Self-pay | Admitting: Family Medicine

## 2020-11-11 DIAGNOSIS — E1159 Type 2 diabetes mellitus with other circulatory complications: Secondary | ICD-10-CM

## 2020-11-11 DIAGNOSIS — E1142 Type 2 diabetes mellitus with diabetic polyneuropathy: Secondary | ICD-10-CM

## 2020-11-11 DIAGNOSIS — I152 Hypertension secondary to endocrine disorders: Secondary | ICD-10-CM

## 2020-11-14 ENCOUNTER — Ambulatory Visit: Payer: Self-pay | Admitting: Pharmacist

## 2020-11-18 ENCOUNTER — Ambulatory Visit: Payer: Self-pay | Admitting: Pharmacist

## 2020-11-19 ENCOUNTER — Telehealth: Payer: Self-pay | Admitting: Pharmacist

## 2020-11-19 NOTE — Telephone Encounter (Signed)
FBG 200-400 Increase insulin to 25 units Instructed patient to go to ED if she is experiencing nausea, vomiting or dizziness

## 2020-11-20 ENCOUNTER — Ambulatory Visit: Payer: Self-pay | Admitting: Pharmacist

## 2020-11-26 ENCOUNTER — Encounter: Payer: Self-pay | Admitting: Family Medicine

## 2020-11-26 ENCOUNTER — Ambulatory Visit (INDEPENDENT_AMBULATORY_CARE_PROVIDER_SITE_OTHER): Payer: HMO | Admitting: Family Medicine

## 2020-11-26 DIAGNOSIS — E1165 Type 2 diabetes mellitus with hyperglycemia: Secondary | ICD-10-CM

## 2020-11-26 NOTE — Progress Notes (Signed)
Telephone visit  Subjective: Stacey review PCP: Janora [name], [name] is a 60 y.o. female calls for telephone consult today. Patient provides verbal consent for consult held via phone.  Due to COVID-19 pandemic this visit was conducted virtually. This visit type was conducted due to national recommendations for restrictions regarding the COVID-19 Pandemic (e.g. social distancing, sheltering in place) in an effort to limit this patient's exposure and mitigate transmission in our community. All issues noted in this document were discussed and addressed.  A physical exam was not performed with this format.   Location of patient: home Location of provider: [facility] Others present for call: spouse  1. DM Injecting 25 units of Basaglar.  Sugar has been High 341, evening blood sugar. FBG 133 this am.  However FBG typically above 200.  Continues to take all other medications as prescribed.  Denies any hypoglycemic episodes.  ROS: Per HPI  Allergies  Allergen Reactions  . Farxiga [Dapagliflozin]     RECURRENT YEAST INFECTIONS  . Morphine And Related Nausea And Vomiting   Past Medical History:  Diagnosis Date  . Arthritis   . CAD (coronary artery disease)    BMS to circumflex 2007 - Dr. Olevia Perches  . CKD (chronic kidney disease) stage 3, GFR 30-59 ml/min (HCC)   . Essential hypertension   . Falls frequently   . GERD (gastroesophageal reflux disease)   . HA (headache)   . Hyperlipidemia   . Left-sided face pain   . Morbid obesity ([place])   . Noncompliance   . OSA (obstructive sleep apnea)    Uses CPAP  . Type 2 diabetes mellitus ([place])   . Urinary incontinence     Current Outpatient Medications:  .  acetaminophen (TYLENOL) 650 MG CR tablet, Take 650 mg by mouth every 8 (eight) hours as needed for pain., Disp: , Rfl:  .  albuterol (VENTOLIN HFA) 108 (90 Base) MCG/ACT inhaler, Inhale 2 puffs into the lungs every 6 (six) hours as needed for wheezing or shortness of breath  (for rescue)., Disp: 1 each, Rfl: 0 .  aspirin EC 81 MG tablet, Take 1 tablet (81 mg total) by mouth daily., Disp: , Rfl:  .  Blood Glucose Monitoring Suppl (ONETOUCH VERIO) w/Device KIT, Use to test blood sugar twice daily as directed; DX: E11.69, Disp: 1 kit, Rfl: 1 .  diclofenac sodium (VOLTAREN) 1 % GEL, Apply 4 g topically 4 (four) times daily., Disp: 400 g, Rfl: 2 .  furosemide (LASIX) 40 MG tablet, TAKE 1 TABLET BY MOUTH ONCE DAILY AS NEEDED FOR EDEMA OR  FLUID (Patient taking differently: Take 40 mg by mouth 2 (two) times daily.), Disp: 90 tablet, Rfl: 0 .  gabapentin (NEURONTIN) 600 MG tablet, Take 1 tablet (600 mg total) by mouth 3 (three) times daily., Disp: 270 tablet, Rfl: 3 .  glimepiride (AMARYL) 2 MG tablet, TAKE 1 TABLET BY MOUTH ONCE DAILY WITH BREAKFAST ., Disp: 90 tablet, Rfl: 2 .  glucose blood (ONETOUCH VERIO) test strip, Use to test blood sugar twice daily as directed; DX: E11.69, Disp: 100 each, Rfl: 5 .  Insulin Glargine (BASAGLAR KWIKPEN [place]), Inject 25 Units into the skin at bedtime., Disp: , Rfl:  .  metFORMIN (GLUCOPHAGE-XR) 500 MG 24 hr tablet, TAKE 2 TABLETS BY MOUTH ONCE DAILY WITH BREAKFAST, Disp: 180 tablet, Rfl: 3 .  metoprolol tartrate (LOPRESSOR) 25 MG tablet, Take 1 tablet by mouth twice daily, Disp: 180 tablet, Rfl: 0 .  nitroGLYCERIN (NITROSTAT) 0.4 MG  SL tablet, Place 1 tablet (0.4 mg total) under the tongue every 5 (five) minutes as needed for chest pain., Disp: 25 tablet, Rfl: 3 .  omeprazole (PRILOSEC) 40 MG capsule, TAKE 1 CAPSULE BY MOUTH 30 MINUTES ONCE DAILY BEFORE MEAL(S), Disp: 90 capsule, Rfl: 3 .  OneTouch Delica Lancets 63S MISC, Use to test blood sugar twice daily as directed; DX: E11.69, Disp: 100 each, Rfl: 3 .  OneTouch Delica Lancets 93T MISC, Check blood sugar BID and prn  E11.9, Disp: 100 each, Rfl: 11 .  rosuvastatin (CRESTOR) 20 MG tablet, Take 1 tablet (20 mg total) by mouth at bedtime., Disp: 90 tablet, Rfl: 1 .  telmisartan (MICARDIS) 40  MG tablet, Take 1 tablet (40 mg total) by mouth daily., Disp: 90 tablet, Rfl: 0  Assessment/ Plan: 60 y.o. female   Uncontrolled type 2 diabetes mellitus with hyperglycemia ([place])  Advance Basaglar to 26 units nightly.  We discussed increasing by 1 unit every 2 days for fasting blood sugar above 150.  Teach back method was performed today.  We will follow-up in 2 weeks for virtual visit to review blood sugar log.  I advised her to contact me if she has any blood sugars below 70.  Continue all other medications as prescribed   Start time: 10:15am End time: 10:21am  Total time spent on patient care (including telephone call/ virtual visit): 6 minutes  [place], [place] [phone]

## 2020-11-27 ENCOUNTER — Telehealth: Payer: Self-pay | Admitting: Pharmacist

## 2020-11-27 NOTE — Telephone Encounter (Signed)
Left VM on patient's home phone to set up patient assistance program medication (basaglar)  Patient must call #[phone] to set up shipment  4 month supply will ship to patient's home

## 2020-11-28 ENCOUNTER — Telehealth: Payer: Self-pay

## 2020-11-28 NOTE — Telephone Encounter (Signed)
Patient's fasting blood sugar 120s basaglar 28 units daily Will resubmit [facility] paperwork for increased insulin dose. Reminded patient to call [facility] for refills

## 2020-11-28 NOTE — Telephone Encounter (Signed)
Pt called to let Raynelle Fanning know that she received her Insulin Rx through the mail yesterday. Says the directions tell her to take 20 units but Dr Nadine Counts last told her to be taking 26 units.   Needs advise on what to do.

## 2020-12-05 ENCOUNTER — Telehealth: Payer: Self-pay | Admitting: Family Medicine

## 2020-12-05 MED ORDER — TELMISARTAN 40 MG PO TABS: 40.0000 mg | ORAL_TABLET | Freq: Every day | ORAL | 1 refills | Status: DC

## 2020-12-05 NOTE — Telephone Encounter (Signed)
  Prescription Request  12/05/2020  What is the name of the medication or equipment? Telmisartan   Have you contacted your pharmacy to request a refill? (if applicable) yes  Which pharmacy would you like this sent to? [facility] in [place]   Patient notified that their request is being sent to the clinical staff for review and that they should receive a response within 2 business days.

## 2020-12-05 NOTE — Telephone Encounter (Signed)
LMOVM refill sent to pharmacy 

## 2020-12-10 ENCOUNTER — Ambulatory Visit (INDEPENDENT_AMBULATORY_CARE_PROVIDER_SITE_OTHER): Payer: HMO | Admitting: Family Medicine

## 2020-12-10 DIAGNOSIS — E1165 Type 2 diabetes mellitus with hyperglycemia: Secondary | ICD-10-CM | POA: Diagnosis not present

## 2020-12-10 NOTE — Progress Notes (Signed)
Telephone visit  Subjective: CC: uncontrolled DM PCP: Stacey Norlander, DO PIR:JJOAC [name] is a 60 y.o. female calls for telephone consult today. Patient provides verbal consent for consult held via phone.  Due to COVID-19 pandemic this visit was conducted virtually. This visit type was conducted due to national recommendations for restrictions regarding the COVID-19 Pandemic (e.g. social distancing, sheltering in place) in an effort to limit this patient's exposure and mitigate transmission in our community. All issues noted in this document were discussed and addressed.  A physical exam was not performed with this format.   Location of patient: home Location of provider: [facility] Others present for call: none  1. Hyperglycemia She reports BG was 309 bedtime.  FBG this am 190.  She is injecting 30 units daily for last 3 days.  Her FBGs have been >150 for last 3 consecutive days.  Reports fatigue. No hypoglycemia.   ROS: Per HPI  Allergies  Allergen Reactions  . Farxiga [Dapagliflozin]     RECURRENT YEAST INFECTIONS  . Morphine And Related Nausea And Vomiting   Past Medical History:  Diagnosis Date  . Arthritis   . CAD (coronary artery disease)    BMS to circumflex 2007 - Dr. Olevia Perches  . CKD (chronic kidney disease) stage 3, GFR 30-59 ml/min (HCC)   . Essential hypertension   . Falls frequently   . GERD (gastroesophageal reflux disease)   . HA (headache)   . Hyperlipidemia   . Left-sided face pain   . Morbid obesity ([place])   . Noncompliance   . OSA (obstructive sleep apnea)    Uses CPAP  . Type 2 diabetes mellitus ([place])   . Urinary incontinence     Current Outpatient Medications:  .  acetaminophen (TYLENOL) 650 MG CR tablet, Take 650 mg by mouth every 8 (eight) hours as needed for pain., Disp: , Rfl:  .  albuterol (VENTOLIN HFA) 108 (90 Base) MCG/ACT inhaler, Inhale 2 puffs into the lungs every 6 (six) hours as needed for wheezing or shortness of breath (for rescue).,  Disp: 1 each, Rfl: 0 .  aspirin EC 81 MG tablet, Take 1 tablet (81 mg total) by mouth daily., Disp: , Rfl:  .  Blood Glucose Monitoring Suppl (ONETOUCH VERIO) w/Device KIT, Use to test blood sugar twice daily as directed; DX: E11.69, Disp: 1 kit, Rfl: 1 .  diclofenac sodium (VOLTAREN) 1 % GEL, Apply 4 g topically 4 (four) times daily., Disp: 400 g, Rfl: 2 .  furosemide (LASIX) 40 MG tablet, TAKE 1 TABLET BY MOUTH ONCE DAILY AS NEEDED FOR EDEMA OR  FLUID (Patient taking differently: Take 40 mg by mouth 2 (two) times daily.), Disp: 90 tablet, Rfl: 0 .  gabapentin (NEURONTIN) 600 MG tablet, Take 1 tablet (600 mg total) by mouth 3 (three) times daily., Disp: 270 tablet, Rfl: 3 .  glimepiride (AMARYL) 2 MG tablet, TAKE 1 TABLET BY MOUTH ONCE DAILY WITH BREAKFAST ., Disp: 90 tablet, Rfl: 2 .  glucose blood (ONETOUCH VERIO) test strip, Use to test blood sugar twice daily as directed; DX: E11.69, Disp: 100 each, Rfl: 5 .  Insulin Glargine (BASAGLAR KWIKPEN [place]), Inject 25 Units into the skin at bedtime., Disp: , Rfl:  .  metFORMIN (GLUCOPHAGE-XR) 500 MG 24 hr tablet, TAKE 2 TABLETS BY MOUTH ONCE DAILY WITH BREAKFAST, Disp: 180 tablet, Rfl: 3 .  metoprolol tartrate (LOPRESSOR) 25 MG tablet, Take 1 tablet by mouth twice daily, Disp: 180 tablet, Rfl: 0 .  nitroGLYCERIN (NITROSTAT)  0.4 MG SL tablet, Place 1 tablet (0.4 mg total) under the tongue every 5 (five) minutes as needed for chest pain., Disp: 25 tablet, Rfl: 3 .  omeprazole (PRILOSEC) 40 MG capsule, TAKE 1 CAPSULE BY MOUTH 30 MINUTES ONCE DAILY BEFORE MEAL(S), Disp: 90 capsule, Rfl: 3 .  OneTouch Delica Lancets 40X MISC, Use to test blood sugar twice daily as directed; DX: E11.69, Disp: 100 each, Rfl: 3 .  OneTouch Delica Lancets 73Z MISC, Check blood sugar BID and prn  E11.9, Disp: 100 each, Rfl: 11 .  rosuvastatin (CRESTOR) 20 MG tablet, Take 1 tablet (20 mg total) by mouth at bedtime., Disp: 90 tablet, Rfl: 1 .  telmisartan (MICARDIS) 40 MG tablet,  Take 1 tablet (40 mg total) by mouth daily., Disp: 90 tablet, Rfl: 1  Assessment/ Plan: 60 y.o. female   Uncontrolled type 2 diabetes mellitus with hyperglycemia ([place])  Advance Basaglar to 32 units. Increase by 1 unit every 2 days for FBG >150. Teach back method reviewed.  Will recheck in 2 weeks for ongoing insulin titration/ new rx for patient assistance.  Start time: 12:35pm End time: 12:44pm  Total time spent on patient care (including telephone call/ virtual visit): 9 minutes  [place], [place] [phone]

## 2020-12-24 ENCOUNTER — Ambulatory Visit (INDEPENDENT_AMBULATORY_CARE_PROVIDER_SITE_OTHER): Payer: HMO | Admitting: Family Medicine

## 2020-12-24 DIAGNOSIS — E1165 Type 2 diabetes mellitus with hyperglycemia: Secondary | ICD-10-CM

## 2020-12-24 MED ORDER — BASAGLAR KWIKPEN 100 UNIT/ML ~~LOC~~ SOPN: 41.0000 [IU] | PEN_INJECTOR | Freq: Every day | SUBCUTANEOUS | 3 refills | Status: DC

## 2020-12-24 NOTE — Progress Notes (Signed)
Telephone visit  Subjective: CC: DM PCP: Janora Norlander, DO [name] is a 60 y.o. female calls for telephone consult today. Patient provides verbal consent for consult held via phone.  Due to COVID-19 pandemic this visit was conducted virtually. This visit type was conducted due to national recommendations for restrictions regarding the COVID-19 Pandemic (e.g. social distancing, sheltering in place) in an effort to limit this patient's exposure and mitigate transmission in our community. All issues noted in this document were discussed and addressed.  A physical exam was not performed with this format.   Location of patient: home Location of provider: [facility] Others present for call: spouse  1. Uncontrolled diabetes She reports fatigue over the last few weeks.  Her evening sugars running 300-413.  She normally eats around 7-730 (checks sugar at 930 pm). She is injecting 41 units of insulin at 10 pm.  FBG 135 this am.  She has gotten rid of sodas.  She does report quite a bit of sleepiness after meals, such that she falls asleep easily.  She is drinking water and coffee.  She admits that most of their meals consist of some type of starch (potatoes, pintos, biscuits, etc).  Not currently checking lunchtime BGs  ROS: Per HPI  Allergies  Allergen Reactions  . Farxiga [Dapagliflozin]     RECURRENT YEAST INFECTIONS  . Morphine And Related Nausea And Vomiting   Past Medical History:  Diagnosis Date  . Arthritis   . CAD (coronary artery disease)    BMS to circumflex 2007 - Dr. Olevia Perches  . CKD (chronic kidney disease) stage 3, GFR 30-59 ml/min (HCC)   . Essential hypertension   . Falls frequently   . GERD (gastroesophageal reflux disease)   . HA (headache)   . Hyperlipidemia   . Left-sided face pain   . Morbid obesity ([place])   . Noncompliance   . OSA (obstructive sleep apnea)    Uses CPAP  . Type 2 diabetes mellitus ([place])   . Urinary incontinence     Current Outpatient  Medications:  .  acetaminophen (TYLENOL) 650 MG CR tablet, Take 650 mg by mouth every 8 (eight) hours as needed for pain., Disp: , Rfl:  .  albuterol (VENTOLIN HFA) 108 (90 Base) MCG/ACT inhaler, Inhale 2 puffs into the lungs every 6 (six) hours as needed for wheezing or shortness of breath (for rescue)., Disp: 1 each, Rfl: 0 .  aspirin EC 81 MG tablet, Take 1 tablet (81 mg total) by mouth daily., Disp: , Rfl:  .  Blood Glucose Monitoring Suppl (ONETOUCH VERIO) w/Device KIT, Use to test blood sugar twice daily as directed; DX: E11.69, Disp: 1 kit, Rfl: 1 .  diclofenac sodium (VOLTAREN) 1 % GEL, Apply 4 g topically 4 (four) times daily., Disp: 400 g, Rfl: 2 .  furosemide (LASIX) 40 MG tablet, TAKE 1 TABLET BY MOUTH ONCE DAILY AS NEEDED FOR EDEMA OR  FLUID (Patient taking differently: Take 40 mg by mouth 2 (two) times daily.), Disp: 90 tablet, Rfl: 0 .  gabapentin (NEURONTIN) 600 MG tablet, Take 1 tablet (600 mg total) by mouth 3 (three) times daily., Disp: 270 tablet, Rfl: 3 .  glimepiride (AMARYL) 2 MG tablet, TAKE 1 TABLET BY MOUTH ONCE DAILY WITH BREAKFAST ., Disp: 90 tablet, Rfl: 2 .  glucose blood (ONETOUCH VERIO) test strip, Use to test blood sugar twice daily as directed; DX: E11.69, Disp: 100 each, Rfl: 5 .  Insulin Glargine (BASAGLAR KWIKPEN [place]), Inject 25 Units  into the skin at bedtime., Disp: , Rfl:  .  metFORMIN (GLUCOPHAGE-XR) 500 MG 24 hr tablet, TAKE 2 TABLETS BY MOUTH ONCE DAILY WITH BREAKFAST, Disp: 180 tablet, Rfl: 3 .  metoprolol tartrate (LOPRESSOR) 25 MG tablet, Take 1 tablet by mouth twice daily, Disp: 180 tablet, Rfl: 0 .  nitroGLYCERIN (NITROSTAT) 0.4 MG SL tablet, Place 1 tablet (0.4 mg total) under the tongue every 5 (five) minutes as needed for chest pain., Disp: 25 tablet, Rfl: 3 .  omeprazole (PRILOSEC) 40 MG capsule, TAKE 1 CAPSULE BY MOUTH 30 MINUTES ONCE DAILY BEFORE MEAL(S), Disp: 90 capsule, Rfl: 3 .  OneTouch Delica Lancets 79M MISC, Use to test blood sugar twice  daily as directed; DX: E11.69, Disp: 100 each, Rfl: 3 .  OneTouch Delica Lancets 99Z MISC, Check blood sugar BID and prn  E11.9, Disp: 100 each, Rfl: 11 .  rosuvastatin (CRESTOR) 20 MG tablet, Take 1 tablet (20 mg total) by mouth at bedtime., Disp: 90 tablet, Rfl: 1 .  telmisartan (MICARDIS) 40 MG tablet, Take 1 tablet (40 mg total) by mouth daily., Disp: 90 tablet, Rfl: 1  Assessment/ Plan: 60 y.o. female   Uncontrolled type 2 diabetes mellitus with hyperglycemia ([place]) - Plan: Insulin Glargine (BASAGLAR KWIKPEN) 100 UNIT/ML  Ongoing elevations of blood sugar.  Most the seem to be postprandial at this point.  I did counsel her at length today on restricting carbohydrates, alternative carbohydrates.  We will mail a packet today as well.  Continue to advance basal insulin by 1 unit every 2 days for fasting blood sugars above 150.  We will need to strongly consider addition of NovoLog at mealtimes but we did discuss that with more insulin, more weight will follow.  She seems motivated to try and get some of the weight off of her.  Hopefully we can do this with diet modification alone.  New Rx sent for reapplication of patient assistance.  We will reconvene again in 2 weeks via telephone visit and at the end of March for in person sugar check  Start time: 11:26am End time: 11:45am  Total time spent on patient care (including telephone call/ virtual visit): 19 minutes  [place], [place] [phone]

## 2020-12-25 ENCOUNTER — Ambulatory Visit (INDEPENDENT_AMBULATORY_CARE_PROVIDER_SITE_OTHER): Payer: HMO | Admitting: *Deleted

## 2020-12-25 DIAGNOSIS — Z0000 Encounter for general adult medical examination without abnormal findings: Secondary | ICD-10-CM

## 2020-12-25 NOTE — Patient Instructions (Signed)
MEDICARE ANNUAL WELLNESS VISIT Health Maintenance Summary and Written Plan of Care  [name] ,  Thank you for allowing me to perform your Medicare Annual Wellness Visit and for your ongoing commitment to your health.   Health Maintenance & Immunization History Health Maintenance  Topic Date Due  . TETANUS/TDAP  Never done  . COLONOSCOPY (Pts 62-66yrs Insurance coverage will need to be confirmed)  04/10/2018  . OPHTHALMOLOGY EXAM  12/26/2019  . MAMMOGRAM  10/17/2020  . INFLUENZA VACCINE  02/05/2021 (Originally 06/08/2020)  . COVID-19 Vaccine (1) [birth year] (Originally 05/10/1966)  . HEMOGLOBIN A1C  05/06/2021  . FOOT EXAM  08/08/2021  . PNEUMOCOCCAL POLYSACCHARIDE VACCINE AGE 19-64 HIGH RISK  Completed  . Hepatitis C Screening  Completed  . HIV Screening  Completed  . PAP SMEAR-Modifier  Discontinued   Immunization History  Administered Date(s) Administered  . Influenza,inj,Quad PF,6+ Mos 08/10/2013, 08/28/2014, 09/25/2015, 11/23/2017, 11/27/2018  . Pneumococcal Conjugate-13 11/27/2018    These are the patient goals that we discussed: Goals Addressed            This Visit's Progress   . AWV       12/25/2020 AWV Goal: Diabetes Management  . Patient will maintain an A1C level below 8.0 . Patient will not develop any diabetic foot complications . Patient will not experience any hypoglycemic episodes over the next 3 months . Patient will notify our office of any CBG readings outside of the provider recommended range by calling [phone] . Patient will adhere to provider recommendations for diabetes management  Patient Self Management Activities . take all medications as prescribed and report any negative side effects . monitor and record blood sugar readings as directed . adhere to a low carbohydrate diet that incorporates lean proteins, vegetables, whole grains, low glycemic fruits . check feet daily noting any sores, cracks, injuries, or callous formations . see PCP or  podiatrist if she notices any changes in her legs, feet, or toenails . Patient will visit PCP and have an A1C level checked every 3 to 6 months as directed  . have a yearly eye exam to monitor for vascular changes associated with diabetes and will request that the report be sent to her pcp.  . consult with her PCP regarding any changes in her health or new or worsening symptoms  12/25/2020 AWV Goal: Exercise for General Health   Patient will verbalize understanding of the benefits of increased physical activity:  Exercising regularly is important. It will improve your overall fitness, flexibility, and endurance.  Regular exercise also will improve your overall health. It can help you control your weight, reduce stress, and improve your bone density.  Over the next year, patient will increase physical activity as tolerated with a goal of at least 150 minutes of moderate physical activity per week.   You can tell that you are exercising at a moderate intensity if your heart starts beating faster and you start breathing faster but can still hold a conversation.  Moderate-intensity exercise ideas include:  Walking 1 mile (1.6 km) in about 15 minutes  Biking  Hiking  Golfing  Dancing  Water aerobics  Patient will verbalize understanding of everyday activities that increase physical activity by providing examples like the following: ? Yard work, such as: ? Pushing a Surveyor, mining ? Raking and bagging leaves ? Washing your car ? Pushing a stroller ? Shoveling snow ? Gardening ? Washing windows or floors  Patient will be able to explain general safety guidelines for  exercising:   Before you start a new exercise program, talk with your health care provider.  Do not exercise so much that you hurt yourself, feel dizzy, or get very short of breath.  Wear comfortable clothes and wear shoes with good support.  Drink plenty of water while you exercise to prevent dehydration or heat  stroke.  Work out until your breathing and your heartbeat get faster.         This is a list of Health Maintenance Items that are overdue or due now: Health Maintenance Due  Topic Date Due  . TETANUS/TDAP  Never done  . COLONOSCOPY (Pts 62-58yrs Insurance coverage will need to be confirmed)  04/10/2018  . OPHTHALMOLOGY EXAM  12/26/2019  . MAMMOGRAM  10/17/2020     Orders/Referrals Placed Today: No orders of the defined types were placed in this encounter.  (Contact our referral department at [phone] if you have not spoken with someone about your referral appointment within the next 5 days)    Follow-up Plan . Follow-up with [name], DO as planned . Keep your yearly eye exam . Discuss tetanus vaccine at next appointment . Discuss referral for colonoscopy . Schedule Mammogram-

## 2020-12-25 NOTE — Progress Notes (Signed)
MEDICARE ANNUAL WELLNESS VISIT  12/25/2020  Telephone Visit Disclaimer This Medicare AWV was conducted by telephone due to national recommendations for restrictions regarding the COVID-19 Pandemic (e.g. social distancing).  I verified, using two identifiers, that I am speaking with [name] or their authorized healthcare agent. I discussed the limitations, risks, security, and privacy concerns of performing an evaluation and management service by telephone and the potential availability of an in-person appointment in the future. The patient expressed understanding and agreed to proceed.  Location of Patient: Home Location of Provider (nurse):  [facility]  Subjective:    [name] is a 60 y.o. female patient of Janora Norlander, DO who had a Medicare Annual Wellness Visit today via telephone. Aune is Disabled and lives with their spouse. she has 5 children. she reports that she is socially active and does not interact with friends/family regularly. she is not physically active and enjoys watching tv, and playing games on phone.  Patient Care Team: Janora Norlander, DO as PCP - General (Family Medicine) Satira Sark, MD as PCP - Cardiology (Cardiology) Marcial Pacas, MD as Consulting Physician (Neurology) Latanya Maudlin, MD as Consulting Physician (Orthopedic Surgery) Satira Sark, MD as Consulting Physician (Cardiology) Ilean China, RN as Registered Nurse Lavera Guise, [facility] (Pharmacist)  Advanced Directives 12/25/2020 09/23/2020 06/18/2020 06/18/2020 12/21/2019 11/27/2018 12/08/2017  Does Patient Have a Medical Advance Directive? _0  No No  Would patient like information on creating a medical advance directive? No - Patient declined No - Patient declined No - Patient declined - No - Patient declined Yes (MAU/Ambulatory/Procedural Areas - Information given) No - Patient declined  Pre-existing out of facility DNR order (yellow  form or pink MOST form) - - - - - - -    Hospital Utilization Over the Past 12 Months: # of hospitalizations or ER visits: 1 # of surgeries: 1- cardiac catherization  Review of Systems    Patient reports that her overall health is worse compared to last year.   History obtained from chart review and the patient Musculoskeletal ROS: positive for - pain in back - lower  Patient Reported Readings (BP, Pulse, CBG, Weight, etc) CBG:157  Pain Assessment Pain : 0-10 Pain Score: 3  Pain Location: Back Pain Orientation: Lower Pain Descriptors / Indicators: Nagging Pain Onset: More than a month ago Pain Frequency: Constant     Current Medications & Allergies (verified) Allergies as of 12/25/2020      Reactions   Farxiga [dapagliflozin]    RECURRENT YEAST INFECTIONS   Morphine And Related Nausea And Vomiting      Medication List       Accurate as of December 25, 2020  8:40 AM. If you have any questions, ask your nurse or doctor.        acetaminophen 650 MG CR tablet Commonly known as: TYLENOL Take 650 mg by mouth every 8 (eight) hours as needed for pain.   albuterol 108 (90 Base) MCG/ACT inhaler Commonly known as: VENTOLIN HFA Inhale 2 puffs into the lungs every 6 (six) hours as needed for wheezing or shortness of breath (for rescue).   aspirin EC 81 MG tablet Take 1 tablet (81 mg total) by mouth daily.   Basaglar KwikPen 100 UNIT/ML Inject 41-50 Units into the skin at bedtime.   diclofenac sodium 1 % Gel Commonly known as: VOLTAREN Apply 4 g topically 4 (four) times daily.   furosemide  40 MG tablet Commonly known as: LASIX TAKE 1 TABLET BY MOUTH ONCE DAILY AS NEEDED FOR EDEMA OR  FLUID What changed: See the new instructions.   gabapentin 600 MG tablet Commonly known as: Neurontin Take 1 tablet (600 mg total) by mouth 3 (three) times daily.   glimepiride 2 MG tablet Commonly known as: AMARYL TAKE 1 TABLET BY MOUTH ONCE DAILY WITH BREAKFAST .   metFORMIN  500 MG 24 hr tablet Commonly known as: GLUCOPHAGE-XR TAKE 2 TABLETS BY MOUTH ONCE DAILY WITH BREAKFAST   metoprolol tartrate 25 MG tablet Commonly known as: LOPRESSOR Take 1 tablet by mouth twice daily   nitroGLYCERIN 0.4 MG SL tablet Commonly known as: NITROSTAT Place 1 tablet (0.4 mg total) under the tongue every 5 (five) minutes as needed for chest pain.   omeprazole 40 MG capsule Commonly known as: PRILOSEC TAKE 1 CAPSULE BY MOUTH 30 MINUTES ONCE DAILY BEFORE MEAL(S)   OneTouch Delica Lancets 10X Misc Use to test blood sugar twice daily as directed; DX: N23.55   OneTouch Delica Lancets 73U Misc Check blood sugar BID and prn  E11.9   OneTouch Verio test strip Generic drug: glucose blood Use to test blood sugar twice daily as directed; DX: E11.69   OneTouch Verio w/Device Kit Use to test blood sugar twice daily as directed; DX: E11.69   rosuvastatin 20 MG tablet Commonly known as: CRESTOR Take 1 tablet (20 mg total) by mouth at bedtime.   telmisartan 40 MG tablet Commonly known as: Micardis Take 1 tablet (40 mg total) by mouth daily.       History (reviewed): Past Medical History:  Diagnosis Date  . Arthritis   . CAD (coronary artery disease)    BMS to circumflex 2007 - Dr. Olevia Perches  . CKD (chronic kidney disease) stage 3, GFR 30-59 ml/min (HCC)   . Essential hypertension   . Falls frequently   . GERD (gastroesophageal reflux disease)   . HA (headache)   . Hyperlipidemia   . Left-sided face pain   . Morbid obesity ([place])   . Noncompliance   . OSA (obstructive sleep apnea)    Uses CPAP  . Type 2 diabetes mellitus ([place])   . Urinary incontinence    Past Surgical History:  Procedure Laterality Date  . ABDOMINAL HERNIA REPAIR    . ABDOMINAL HYSTERECTOMY    . BACK SURGERY    . BREAST SURGERY     biopsy per right breast; pt states has silver piece in for marking   . Cataract surgery Bilateral   . CHOLECYSTECTOMY    . COLONOSCOPY N/A 04/10/2013   Procedure:  COLONOSCOPY;  Surgeon: Rogene Houston, MD;  Location: [facility];  Service: Endoscopy;  Laterality: N/A;  . CORONARY ANGIOPLASTY WITH STENT PLACEMENT  2012  . LEFT HEART CATH AND CORONARY ANGIOGRAPHY N/A 06/19/2020   Procedure: LEFT HEART CATH AND CORONARY ANGIOGRAPHY;  Surgeon: Belva Crome, MD;  Location: [facility];  Service: Cardiovascular;  Laterality: N/A;  . LUMBAR LAMINECTOMY/DECOMPRESSION MICRODISCECTOMY N/A 04/25/2015   Procedure: CENTRAL LUMBAR /DECOMPRESSION L4-L5;  Surgeon: Latanya Maudlin, MD;  Location: [facility];  Service: Orthopedics;  Laterality: N/A;  . TUBAL LIGATION     Family History  Problem Relation Age of Onset  . Coronary artery disease Father   . Cancer - Colon Father   . Diabetes Father   . High Cholesterol Father   . Breast cancer Mother   . Arthritis Sister   . Asthma Sister   .  High Cholesterol Daughter   . Thyroid disease Daughter   . Hiatal hernia Son   . Congenital heart disease Son    Social History   Socioeconomic History  . Marital status: Married    Spouse name: Gwyndolyn Saxon  . Number of children: 5  . Years of education: 66 th  . Highest education level: 10th grade  Occupational History  . Occupation: disabled     Comment: disabled  Tobacco Use  . Smoking status: Current Every Day Smoker    Packs/day: 1.00    Years: 47.00    Pack years: 47.00    Types: Cigarettes    Start date: 05/14/1972  . Smokeless tobacco: Never Used  Vaping Use  . Vaping Use: Never used  Substance and Sexual Activity  . Alcohol use: No    Alcohol/week: 0.0 standard drinks  . Drug use: No  . Sexual activity: Not Currently    Birth control/protection: Surgical  Other Topics Concern  . Not on file  Social History Narrative   Patient lives at home with her husband. Patient is disabled.   Patient has 10 th grade education.   Right handed.   Caffeine- one  cup of coffee and  One soda Dr.Pepper/ tea. daily   Social Determinants of Health   Financial  Resource Strain: Medium Risk  . Difficulty of Paying Living Expenses: Somewhat hard  Food Insecurity: Not on file  Transportation Needs: Not on file  Physical Activity: Not on file  Stress: Not on file  Social Connections: Not on file    Activities of Daily Living In your present state of health, do you have any difficulty performing the following activities: 12/25/2020 06/18/2020  Hearing? N N  Vision? N N  Comment Wears reading glasses -  Difficulty concentrating or making decisions? N N  Walking or climbing stairs? Y Y  Comment Due to arthritis and neuropathy -  Dressing or bathing? N Y  Doing errands, shopping? Y Y  Comment Husband does the driving she is able but does not like to. -  Preparing Food and eating ? Y -  Comment Husband prepares the food she is unable to stand long at the stove -  Using the Toilet? N -  In the past six months, have you accidently leaked urine? Y -  Do you have problems with loss of bowel control? Y -  Managing your Medications? N -  Managing your Finances? N -  Housekeeping or managing your Housekeeping? Y -  Comment Husband does most of the housekeeping. She does the laundry and helps wash dishes -  Some recent data might be hidden    Patient Education/ Literacy How often do you need to have someone help you when you read instructions, pamphlets, or other written materials from your doctor or pharmacy?: 1 - Never  Exercise Current Exercise Habits: The patient does not participate in regular exercise at present  Diet Patient reports consuming 3 meals a day and 1 snack(s) a day Patient reports that her primary diet is: Regular Patient reports that she does have regular access to food.   Depression Screen PHQ 2/9 Scores 12/25/2020 11/05/2020 03/03/2020 03/03/2020 12/21/2019 08/31/2019 07/30/2019  PHQ - 2 Score _0 0 0 0 0  PHQ- 9 Score _1 - - - 0  Exception Documentation - - - - - - -  Not completed - - - - - - -     Fall Risk Fall  Risk  12/25/2020 08/08/2020 03/03/2020 12/21/2019 08/31/2019  Falls in the past year? 0 0 0 0 -  Comment - - - - -  Number falls in past yr: - - - - -  Injury with Fall? - - - - -  Comment - - - - -  Risk Factor Category  - - - - -  Risk for fall due to : - - - Impaired mobility;History of fall(s) Other (Comment)  Risk for fall due to: Comment - - - she uses her cane in the house but when she goes out she uses the walker Obesity and stent .  Follow up - - - - -     Objective:  [name] seemed alert and oriented and she participated appropriately during our telephone visit.  Blood Pressure Weight BMI  BP Readings from Last 3 Encounters:  11/05/20 121/69  08/08/20 133/73  08/05/20 (!) 160/78   Wt Readings from Last 3 Encounters:  11/05/20 (!) 311 lb (141.1 kg)  08/08/20 (!) 323 lb 9.6 oz (146.8 kg)  08/05/20 (!) 319 lb (144.7 kg)   BMI Readings from Last 1 Encounters:  11/05/20 56.88 kg/m    *Unable to obtain current vital signs, weight, and BMI due to telephone visit type  Hearing/Vision  . Ardice did not seem to have difficulty with hearing/understanding during the telephone conversation . Reports that she has not had a formal eye exam by an eye care professional within the past year . Reports that she has not had a formal hearing evaluation within the past year *Unable to fully assess hearing and vision during telephone visit type  Cognitive Function: 6CIT Screen 12/25/2020 12/21/2019  What Year? 0 points 0 points  What month? 0 points 0 points  What time? 0 points 0 points  Count back from 20 0 points 0 points  Months in reverse 4 points 0 points  Repeat phrase 2 points 0 points  Total Score 6 0   (Normal:0-7, Significant for Dysfunction: >8)  Normal Cognitive Function Screening: Yes   Immunization & Health Maintenance Record Immunization History  Administered Date(s) Administered  . Influenza,inj,Quad PF,6+ Mos 08/10/2013, 08/28/2014, 09/25/2015, 11/23/2017,  11/27/2018  . Pneumococcal Conjugate-13 11/27/2018    Health Maintenance  Topic Date Due  . TETANUS/TDAP  Never done  . COLONOSCOPY (Pts 45-21yr Insurance coverage will need to be confirmed)  04/10/2018  . OPHTHALMOLOGY EXAM  12/26/2019  . MAMMOGRAM  10/17/2020  . INFLUENZA VACCINE  02/05/2021 (Originally 06/08/2020)  . COVID-19 Vaccine (1) 06/24/2021 (Originally 05/10/1966)  . HEMOGLOBIN A1C  05/06/2021  . FOOT EXAM  08/08/2021  . PNEUMOCOCCAL POLYSACCHARIDE VACCINE AGE 36-64 HIGH RISK  Completed  . Hepatitis C Screening  Completed  . HIV Screening  Completed  . PAP SMEAR-Modifier  Discontinued       Assessment  This is a routine wellness examination for BMARIAISABEL BODIFORD  Health Maintenance: Due or Overdue Health Maintenance Due  Topic Date Due  . TETANUS/TDAP  Never done  . COLONOSCOPY (Pts 45-4108yrInsurance coverage will need to be confirmed)  04/10/2018  . OPHTHALMOLOGY EXAM  12/26/2019  . MAMMOGRAM  10/17/2020    BeRosana Bergeroes not need a referral for Community Assistance: Care Management:   no Social Work:    no Prescription Assistance:  no Nutrition/Diabetes Education:  no   Plan:  Personalized Goals Goals Addressed            This Visit's Progress   . AWV  12/25/2020 AWV Goal: Diabetes Management  . Patient will maintain an A1C level below 8.0 . Patient will not develop any diabetic foot complications . Patient will not experience any hypoglycemic episodes over the next 3 months . Patient will notify our office of any CBG readings outside of the provider recommended range by calling [phone] . Patient will adhere to provider recommendations for diabetes management  Patient Self Management Activities . take all medications as prescribed and report any negative side effects . monitor and record blood sugar readings as directed . adhere to a low carbohydrate diet that incorporates lean proteins, vegetables, whole grains, low glycemic  fruits . check feet daily noting any sores, cracks, injuries, or callous formations . see PCP or podiatrist if she notices any changes in her legs, feet, or toenails . Patient will visit PCP and have an A1C level checked every 3 to 6 months as directed  . have a yearly eye exam to monitor for vascular changes associated with diabetes and will request that the report be sent to her pcp.  . consult with her PCP regarding any changes in her health or new or worsening symptoms  12/25/2020 AWV Goal: Exercise for General Health   Patient will verbalize understanding of the benefits of increased physical activity:  Exercising regularly is important. It will improve your overall fitness, flexibility, and endurance.  Regular exercise also will improve your overall health. It can help you control your weight, reduce stress, and improve your bone density.  Over the next year, patient will increase physical activity as tolerated with a goal of at least 150 minutes of moderate physical activity per week.   You can tell that you are exercising at a moderate intensity if your heart starts beating faster and you start breathing faster but can still hold a conversation.  Moderate-intensity exercise ideas include:  Walking 1 mile (1.6 km) in about 15 minutes  Biking  Hiking  Golfing  Dancing  Water aerobics  Patient will verbalize understanding of everyday activities that increase physical activity by providing examples like the following: ? Yard work, such as: ? Pushing a Surveyor, mining ? Raking and bagging leaves ? Washing your car ? Pushing a stroller ? Shoveling snow ? Gardening ? Washing windows or floors  Patient will be able to explain general safety guidelines for exercising:   Before you start a new exercise program, talk with your health care provider.  Do not exercise so much that you hurt yourself, feel dizzy, or get very short of breath.  Wear comfortable clothes and wear  shoes with good support.  Drink plenty of water while you exercise to prevent dehydration or heat stroke.  Work out until your breathing and your heartbeat get faster.       Personalized Health Maintenance & Screening Recommendations  Td vaccine Screening mammography Colorectal cancer screening Diabetic eye exam  Lung Cancer Screening Recommended: yes- Will discuss with patient at next visit with provider (Low Dose CT Chest recommended if Age 67-80 years, 30 pack-year currently smoking OR have quit w/in past 15 years) Hepatitis C Screening recommended: no HIV Screening recommended: no  Advanced Directives: Written information was not prepared per patient's request.  Referrals & Orders No orders of the defined types were placed in this encounter.   Follow-up Plan . Follow-up with [name], DO as planned . Keep your yearly eye exam . Discuss tetanus vaccine at next appointment . Discuss referral for colonoscopy . Schedule Mammogram-patient states that  she will schedule on her own. . AVS printed and mailed to patient    I have personally reviewed and noted the following in the patient's chart:   . Medical and social history . Use of alcohol, tobacco or illicit drugs  . Current medications and supplements . Functional ability and status . Nutritional status . Physical activity . Advanced directives . List of other physicians . Hospitalizations, surgeries, and ER visits in previous 12 months . Vitals . Screenings to include cognitive, depression, and falls . Referrals and appointments  In addition, I have reviewed and discussed with [name] certain preventive protocols, quality metrics, and best practice recommendations. A written personalized care plan for preventive services as well as general preventive health recommendations is available and can be mailed to the patient at her request.      Gareth Morgan  12/25/2020

## 2021-01-08 ENCOUNTER — Ambulatory Visit (INDEPENDENT_AMBULATORY_CARE_PROVIDER_SITE_OTHER): Payer: HMO | Admitting: Family Medicine

## 2021-01-08 ENCOUNTER — Telehealth: Payer: Self-pay | Admitting: Family Medicine

## 2021-01-08 DIAGNOSIS — E1165 Type 2 diabetes mellitus with hyperglycemia: Secondary | ICD-10-CM | POA: Diagnosis not present

## 2021-01-08 NOTE — Progress Notes (Signed)
Telephone visit  Subjective: CC: uncontrolled DM PCP: Janora Norlander, DO WRU:EAVWU [name] is a 60 y.o. female calls for telephone consult today. Patient provides verbal consent for consult held via phone.  Due to COVID-19 pandemic this visit was conducted virtually. This visit type was conducted due to national recommendations for restrictions regarding the COVID-19 Pandemic (e.g. social distancing, sheltering in place) in an effort to limit this patient's exposure and mitigate transmission in our community. All issues noted in this document were discussed and addressed.  A physical exam was not performed with this format.   Location of patient: home Location of provider: [facility] Others present for call: none  1. Uncontrolled DM She reports that this am FBG 156; 2 hours dinner pp BG: 221; Today's pp BG 131.  She reports 2/27 BG got up to 404.  She cannot recall what she ate that caused it to go up that much.  She took her insulin and it went down to low 100s.  She is up to 42 units at bedtime.   ROS: Per HPI  Allergies  Allergen Reactions  . Farxiga [Dapagliflozin]     RECURRENT YEAST INFECTIONS  . Morphine And Related Nausea And Vomiting   Past Medical History:  Diagnosis Date  . Arthritis   . CAD (coronary artery disease)    BMS to circumflex 2007 - Dr. Olevia Perches  . CKD (chronic kidney disease) stage 3, GFR 30-59 ml/min (HCC)   . Essential hypertension   . Falls frequently   . GERD (gastroesophageal reflux disease)   . HA (headache)   . Hyperlipidemia   . Left-sided face pain   . Morbid obesity ([place])   . Noncompliance   . OSA (obstructive sleep apnea)    Uses CPAP  . Type 2 diabetes mellitus ([place])   . Urinary incontinence     Current Outpatient Medications:  .  acetaminophen (TYLENOL) 650 MG CR tablet, Take 650 mg by mouth every 8 (eight) hours as needed for pain., Disp: , Rfl:  .  albuterol (VENTOLIN HFA) 108 (90 Base) MCG/ACT inhaler, Inhale 2 puffs into the lungs  every 6 (six) hours as needed for wheezing or shortness of breath (for rescue)., Disp: 1 each, Rfl: 0 .  aspirin EC 81 MG tablet, Take 1 tablet (81 mg total) by mouth daily., Disp: , Rfl:  .  Blood Glucose Monitoring Suppl (ONETOUCH VERIO) w/Device KIT, Use to test blood sugar twice daily as directed; DX: E11.69, Disp: 1 kit, Rfl: 1 .  diclofenac sodium (VOLTAREN) 1 % GEL, Apply 4 g topically 4 (four) times daily., Disp: 400 g, Rfl: 2 .  furosemide (LASIX) 40 MG tablet, TAKE 1 TABLET BY MOUTH ONCE DAILY AS NEEDED FOR EDEMA OR  FLUID (Patient taking differently: Take 40 mg by mouth 2 (two) times daily.), Disp: 90 tablet, Rfl: 0 .  gabapentin (NEURONTIN) 600 MG tablet, Take 1 tablet (600 mg total) by mouth 3 (three) times daily., Disp: 270 tablet, Rfl: 3 .  glimepiride (AMARYL) 2 MG tablet, TAKE 1 TABLET BY MOUTH ONCE DAILY WITH BREAKFAST ., Disp: 90 tablet, Rfl: 2 .  glucose blood (ONETOUCH VERIO) test strip, Use to test blood sugar twice daily as directed; DX: E11.69, Disp: 100 each, Rfl: 5 .  Insulin Glargine (BASAGLAR KWIKPEN) 100 UNIT/ML, Inject 41-50 Units into the skin at bedtime., Disp: 45 mL, Rfl: 3 .  metFORMIN (GLUCOPHAGE-XR) 500 MG 24 hr tablet, TAKE 2 TABLETS BY MOUTH ONCE DAILY WITH BREAKFAST, Disp: 180  tablet, Rfl: 3 .  metoprolol tartrate (LOPRESSOR) 25 MG tablet, Take 1 tablet by mouth twice daily, Disp: 180 tablet, Rfl: 0 .  nitroGLYCERIN (NITROSTAT) 0.4 MG SL tablet, Place 1 tablet (0.4 mg total) under the tongue every 5 (five) minutes as needed for chest pain., Disp: 25 tablet, Rfl: 3 .  omeprazole (PRILOSEC) 40 MG capsule, TAKE 1 CAPSULE BY MOUTH 30 MINUTES ONCE DAILY BEFORE MEAL(S), Disp: 90 capsule, Rfl: 3 .  OneTouch Delica Lancets 44Q MISC, Use to test blood sugar twice daily as directed; DX: E11.69, Disp: 100 each, Rfl: 3 .  OneTouch Delica Lancets 58A MISC, Check blood sugar BID and prn  E11.9, Disp: 100 each, Rfl: 11 .  rosuvastatin (CRESTOR) 20 MG tablet, Take 1 tablet (20  mg total) by mouth at bedtime., Disp: 90 tablet, Rfl: 1 .  telmisartan (MICARDIS) 40 MG tablet, Take 1 tablet (40 mg total) by mouth daily., Disp: 90 tablet, Rfl: 1  Assessment/ Plan: 60 y.o. female   Uncontrolled type 2 diabetes mellitus with hyperglycemia ([place])  Likely needs mealtime insulin. Advised to continue 42 units.  Track what foods spike sugar.  Reviewed hypoglycemic levels and emergent precautions.  Will contact her patient assistance [phone], [phone] to get them the new rx with current directions.  Start time: 12:57pm End time: 1:08pm  Total time spent on patient care (including telephone call/ virtual visit): 11 minutes  [place], [place] [phone]

## 2021-01-12 NOTE — Progress Notes (Signed)
TC to [facility], fax for increased dose was not received in January. Spoke with pharmacist and gave verbal order of increase dose to 35 u every evening 4 mos supply with 4 refills per signed paperwork.

## 2021-01-12 NOTE — Progress Notes (Signed)
So the change in the system was 41-50 units.  Please make sure that this is given correctly.

## 2021-01-12 NOTE — Progress Notes (Signed)
LMOVM to [facility] line for correct dose of Basaglar of 41-50 units.

## 2021-01-30 ENCOUNTER — Other Ambulatory Visit: Payer: Self-pay | Admitting: Family Medicine

## 2021-01-30 DIAGNOSIS — E785 Hyperlipidemia, unspecified: Secondary | ICD-10-CM

## 2021-01-30 DIAGNOSIS — E1169 Type 2 diabetes mellitus with other specified complication: Secondary | ICD-10-CM

## 2021-02-13 ENCOUNTER — Encounter: Payer: Self-pay | Admitting: Family Medicine

## 2021-02-13 ENCOUNTER — Ambulatory Visit (INDEPENDENT_AMBULATORY_CARE_PROVIDER_SITE_OTHER): Payer: HMO | Admitting: Family Medicine

## 2021-02-13 ENCOUNTER — Other Ambulatory Visit: Payer: Self-pay

## 2021-02-13 VITALS — BP 89/53 | HR 81 | Temp 96.6°F | Ht 62.0 in | Wt 323.0 lb

## 2021-02-13 DIAGNOSIS — D696 Thrombocytopenia, unspecified: Secondary | ICD-10-CM

## 2021-02-13 DIAGNOSIS — I152 Hypertension secondary to endocrine disorders: Secondary | ICD-10-CM | POA: Diagnosis not present

## 2021-02-13 DIAGNOSIS — E1142 Type 2 diabetes mellitus with diabetic polyneuropathy: Secondary | ICD-10-CM | POA: Diagnosis not present

## 2021-02-13 DIAGNOSIS — Z1211 Encounter for screening for malignant neoplasm of colon: Secondary | ICD-10-CM | POA: Diagnosis not present

## 2021-02-13 DIAGNOSIS — E785 Hyperlipidemia, unspecified: Secondary | ICD-10-CM | POA: Diagnosis not present

## 2021-02-13 DIAGNOSIS — E1159 Type 2 diabetes mellitus with other circulatory complications: Secondary | ICD-10-CM | POA: Diagnosis not present

## 2021-02-13 DIAGNOSIS — E1169 Type 2 diabetes mellitus with other specified complication: Secondary | ICD-10-CM

## 2021-02-13 LAB — BAYER DCA HB A1C WAIVED: HB A1C (BAYER DCA - WAIVED): 9.4 % — ABNORMAL HIGH (ref <7.0)

## 2021-02-13 MED ORDER — RYBELSUS 3 MG PO TABS: 3.0000 mg | ORAL_TABLET | Freq: Every day | ORAL | 0 refills | Status: AC

## 2021-02-13 MED ORDER — GABAPENTIN 600 MG PO TABS: 900.0000 mg | ORAL_TABLET | Freq: Three times a day (TID) | ORAL | 3 refills | Status: DC

## 2021-02-13 MED ORDER — ALPHA-LIPOIC ACID 600 MG PO CAPS: 1.0000 | ORAL_CAPSULE | Freq: Every day | ORAL | 3 refills | Status: DC

## 2021-02-13 NOTE — Patient Instructions (Signed)
1. Rybelsus sample given.  Take 1 tablet every day 30 minutes before breakfast with small glass of water  2. Gabapentin increased to 900mg  daily (start with increased dose only at night time, then after 3 days can increase dose in morning and then after 3 days can increase dose in afternoon)  3. Alpha lipoic acid added for diabetic nerve pain. Take daily.

## 2021-02-13 NOTE — Progress Notes (Signed)
Subjective: CC: DM PCP: Janora Norlander, DO [id number] [name] is a 60 y.o. female presenting to clinic today for:  1. Type 2 Diabetes with hypertension, hyperlipidemia, polyneuropathy:  Patient reports compliance with Amaryl 2 mg daily, 45 units of Basaglar, 1000 mg of Metformin extended release.  Blood sugars have run anywhere from low 100s to 240s in the morning and typically from 200s to 300s each evening after supper.  Last eye exam: need; 02/17/21 Last foot exam: UTD Last A1c:  Lab Results  Component Value Date   HGBA1C 9.1 (H) 11/05/2020   Nephropathy screen indicated?: UTD Last flu, zoster and/or pneumovax:  Immunization History  Administered Date(s) Administered  . Influenza,inj,Quad PF,6+ Mos 08/10/2013, 08/28/2014, 09/25/2015, 11/23/2017, 11/27/2018  . Pneumococcal Conjugate-13 11/27/2018    Denies any chest pain, shortness of breath.  She does report feeling weak and tired.  Not monitoring blood pressures at home but does have method to do so.  She reports exacerbation of her neuropathy and sciatica down the right lower extremity   ROS: Per HPI  Allergies  Allergen Reactions  . Farxiga [Dapagliflozin]     RECURRENT YEAST INFECTIONS  . Morphine And Related Nausea And Vomiting   Past Medical History:  Diagnosis Date  . Arthritis   . CAD (coronary artery disease)    BMS to circumflex 2007 - Dr. Olevia Perches  . CKD (chronic kidney disease) stage 3, GFR 30-59 ml/min (HCC)   . Essential hypertension   . Falls frequently   . GERD (gastroesophageal reflux disease)   . HA (headache)   . Hyperlipidemia   . Left-sided face pain   . Morbid obesity ([place])   . Noncompliance   . OSA (obstructive sleep apnea)    Uses CPAP  . Type 2 diabetes mellitus ([place])   . Urinary incontinence     Current Outpatient Medications:  .  acetaminophen (TYLENOL) 650 MG CR tablet, Take 650 mg by mouth every 8 (eight) hours as needed for pain., Disp: , Rfl:  .  albuterol (VENTOLIN HFA)  108 (90 Base) MCG/ACT inhaler, Inhale 2 puffs into the lungs every 6 (six) hours as needed for wheezing or shortness of breath (for rescue)., Disp: 1 each, Rfl: 0 .  aspirin EC 81 MG tablet, Take 1 tablet (81 mg total) by mouth daily., Disp: , Rfl:  .  Blood Glucose Monitoring Suppl (ONETOUCH VERIO) w/Device KIT, Use to test blood sugar twice daily as directed; DX: E11.69, Disp: 1 kit, Rfl: 1 .  diclofenac sodium (VOLTAREN) 1 % GEL, Apply 4 g topically 4 (four) times daily., Disp: 400 g, Rfl: 2 .  furosemide (LASIX) 40 MG tablet, TAKE 1 TABLET BY MOUTH ONCE DAILY AS NEEDED FOR EDEMA OR  FLUID (Patient taking differently: Take 40 mg by mouth 2 (two) times daily.), Disp: 90 tablet, Rfl: 0 .  gabapentin (NEURONTIN) 600 MG tablet, Take 1 tablet (600 mg total) by mouth 3 (three) times daily., Disp: 270 tablet, Rfl: 3 .  glimepiride (AMARYL) 2 MG tablet, TAKE 1 TABLET BY MOUTH ONCE DAILY WITH BREAKFAST ., Disp: 90 tablet, Rfl: 2 .  glucose blood (ONETOUCH VERIO) test strip, Use to test blood sugar twice daily as directed; DX: E11.69, Disp: 100 each, Rfl: 5 .  Insulin Glargine (BASAGLAR KWIKPEN) 100 UNIT/ML, Inject 41-50 Units into the skin at bedtime., Disp: 45 mL, Rfl: 3 .  metFORMIN (GLUCOPHAGE-XR) 500 MG 24 hr tablet, TAKE 2 TABLETS BY MOUTH ONCE DAILY WITH BREAKFAST, Disp: 180 tablet, Rfl:  3 .  metoprolol tartrate (LOPRESSOR) 25 MG tablet, Take 1 tablet by mouth twice daily, Disp: 180 tablet, Rfl: 0 .  nitroGLYCERIN (NITROSTAT) 0.4 MG SL tablet, Place 1 tablet (0.4 mg total) under the tongue every 5 (five) minutes as needed for chest pain., Disp: 25 tablet, Rfl: 3 .  omeprazole (PRILOSEC) 40 MG capsule, TAKE 1 CAPSULE BY MOUTH 30 MINUTES ONCE DAILY BEFORE MEAL(S), Disp: 90 capsule, Rfl: 3 .  OneTouch Delica Lancets 93T MISC, Use to test blood sugar twice daily as directed; DX: E11.69, Disp: 100 each, Rfl: 3 .  OneTouch Delica Lancets 70V MISC, Check blood sugar BID and prn  E11.9, Disp: 100 each, Rfl:  11 .  rosuvastatin (CRESTOR) 20 MG tablet, TAKE 1 TABLET BY MOUTH AT BEDTIME, Disp: 90 tablet, Rfl: 0 .  telmisartan (MICARDIS) 40 MG tablet, Take 1 tablet (40 mg total) by mouth daily., Disp: 90 tablet, Rfl: 1 Social History   Socioeconomic History  . Marital status: Married    Spouse name: Gwyndolyn Saxon  . Number of children: 5  . Years of education: 33 th  . Highest education level: 10th grade  Occupational History  . Occupation: disabled     Comment: disabled  Tobacco Use  . Smoking status: Current Every Day Smoker    Packs/day: 1.00    Years: 47.00    Pack years: 47.00    Types: Cigarettes    Start date: 05/14/1972  . Smokeless tobacco: Never Used  Vaping Use  . Vaping Use: Never used  Substance and Sexual Activity  . Alcohol use: No    Alcohol/week: 0.0 standard drinks  . Drug use: No  . Sexual activity: Not Currently    Birth control/protection: Surgical  Other Topics Concern  . Not on file  Social History Narrative   Patient lives at home with her husband. Patient is disabled.   Patient has 10 th grade education.   Right handed.   Caffeine- one  cup of coffee and  One soda Dr.Pepper/ tea. daily   Social Determinants of Health   Financial Resource Strain: Medium Risk  . Difficulty of Paying Living Expenses: Somewhat hard  Food Insecurity: Not on file  Transportation Needs: Not on file  Physical Activity: Not on file  Stress: Not on file  Social Connections: Not on file  Intimate Partner Violence: Not on file   Family History  Problem Relation Age of Onset  . Coronary artery disease Father   . Cancer - Colon Father   . Diabetes Father   . High Cholesterol Father   . Breast cancer Mother   . Arthritis Sister   . Asthma Sister   . High Cholesterol Daughter   . Thyroid disease Daughter   . Hiatal hernia Son   . Congenital heart disease Son     Objective: Office vital signs reviewed. BP (!) 89/53   Pulse 81   Temp (!) 96.6 F (35.9 C) (Temporal)   Ht 5'  2" (1.575 m)   Wt (!) 323 lb (146.5 kg)   SpO2 95%   BMI 59.08 kg/m   Physical Examination:  General: Awake, alert, obese, chronically ill-appearing.  No acute distress Cardio: regular rate and rhythm, S1S2 heard, no murmurs appreciated Pulm: clear to auscultation bilaterally, no wheezes, rhonchi or rales; normal work of breathing on room air Extremities: warm, well perfused, No edema, cyanosis or clubbing; +2 pulses bilaterally  Assessment/ Plan: 60 y.o. female   Type 2 diabetes mellitus with diabetic polyneuropathy,  without long-term current use of insulin (HCC) - Plan: Bayer DCA Hb A1c Waived, Semaglutide (RYBELSUS) 3 MG TABS, gabapentin (NEURONTIN) 600 MG tablet, Alpha-Lipoic Acid 600 MG CAPS  Hypertension associated with diabetes ([place])  Hyperlipidemia associated with type 2 diabetes mellitus (HCC)  Thrombocytopenia ([place]) - Plan: CBC with Differential  Screen for colon cancer - Plan: Cologuard  Sugars uncontrolled with A1c up to 9.4 today.  We are adding Rybelsus 3 mg and anticipate advancing this medication to full 14 mg.  I have advised her to follow-up with Almyra Free in the next few weeks to determine tolerance of this as she has been intolerant to Trulicity in the past.  Hopefully she will better tolerate this formulation however.  Alpha lipoic acid added in efforts to improve diabetic neuropathy.  Neurontin also increased to 900 mg 3 times daily advised her to gradually increase this.    Would like to see her back in 3 months, sooner if needed  Continue statin  Blood pressure too well controlled.  I have advised her to hold off on her telmisartan and reduce her beta-blocker by 50% over the next couple of days.  She is to monitor her blood pressure.  Goal less than 150/90 but above 110/50.  She is to follow-up with nurse on Monday for blood pressure recheck.  Check CBC given history of thrombocytopenia and now fatigue  Cologuard ordered  No orders of the defined types were  placed in this encounter.  No orders of the defined types were placed in this encounter.    Janora Norlander, DO [place] [phone]

## 2021-02-14 LAB — CBC WITH DIFFERENTIAL/PLATELET
Basophils Absolute: 0.1 10*3/uL (ref 0.0–0.2)
Basos: 1 %
EOS (ABSOLUTE): 0.6 10*3/uL — ABNORMAL HIGH (ref 0.0–0.4)
Eos: 6 %
Hematocrit: 38.4 % (ref 34.0–46.6)
Hemoglobin: 12.9 g/dL (ref 11.1–15.9)
Immature Grans (Abs): 0 10*3/uL (ref 0.0–0.1)
Immature Granulocytes: 0 %
Lymphocytes Absolute: 4.1 10*3/uL — ABNORMAL HIGH (ref 0.7–3.1)
Lymphs: 44 %
MCH: 31.3 pg (ref 26.6–33.0)
MCHC: 33.6 g/dL (ref 31.5–35.7)
MCV: 93 fL (ref 79–97)
Monocytes Absolute: 0.5 10*3/uL (ref 0.1–0.9)
Monocytes: 6 %
Neutrophils Absolute: 4.1 10*3/uL (ref 1.4–7.0)
Neutrophils: 43 %
Platelets: 101 10*3/uL — ABNORMAL LOW (ref 150–450)
RBC: 4.12 x10E6/uL (ref 3.77–5.28)
RDW: 15.3 % (ref 11.7–15.4)
WBC: 9.4 10*3/uL (ref 3.4–10.8)

## 2021-02-17 DIAGNOSIS — Z961 Presence of intraocular lens: Secondary | ICD-10-CM | POA: Diagnosis not present

## 2021-02-17 DIAGNOSIS — H26491 Other secondary cataract, right eye: Secondary | ICD-10-CM | POA: Diagnosis not present

## 2021-02-17 DIAGNOSIS — E119 Type 2 diabetes mellitus without complications: Secondary | ICD-10-CM | POA: Diagnosis not present

## 2021-03-03 ENCOUNTER — Ambulatory Visit: Payer: HMO | Admitting: Pharmacist

## 2021-03-10 DIAGNOSIS — E113292 Type 2 diabetes mellitus with mild nonproliferative diabetic retinopathy without macular edema, left eye: Secondary | ICD-10-CM | POA: Diagnosis not present

## 2021-03-10 DIAGNOSIS — E113391 Type 2 diabetes mellitus with moderate nonproliferative diabetic retinopathy without macular edema, right eye: Secondary | ICD-10-CM | POA: Diagnosis not present

## 2021-03-10 DIAGNOSIS — H43823 Vitreomacular adhesion, bilateral: Secondary | ICD-10-CM | POA: Diagnosis not present

## 2021-03-10 DIAGNOSIS — H348122 Central retinal vein occlusion, left eye, stable: Secondary | ICD-10-CM | POA: Diagnosis not present

## 2021-03-12 ENCOUNTER — Ambulatory Visit: Payer: HMO | Admitting: Pharmacist

## 2021-03-13 ENCOUNTER — Other Ambulatory Visit: Payer: Self-pay

## 2021-03-13 ENCOUNTER — Ambulatory Visit (INDEPENDENT_AMBULATORY_CARE_PROVIDER_SITE_OTHER): Payer: HMO | Admitting: Pharmacist

## 2021-03-13 DIAGNOSIS — E1142 Type 2 diabetes mellitus with diabetic polyneuropathy: Secondary | ICD-10-CM

## 2021-03-13 NOTE — Progress Notes (Signed)
    03/13/2021 Name: SHAYLIE EKLUND MRN: [id number] DOB: Nov 21, 1960   S:  57 yoF Presents for diabetes evaluation, education, and management Patient was referred and last seen by Primary Care Provider on 02/13/21.  Insurance coverage/medication affordability: [employer] medicare  Patient reports adherence with medications. . Current diabetes medications include: rybelsus, basaglar, metformin, glimepiride . Current hypertension medications include: telmisartan, metoprolol Goal 130/80 . Current hyperlipidemia medications include: rosuvastatin   Patient denies hypoglycemic events.   Patient reported dietary habits: Eats 3 meals/day Discussed meal planning options and Plate method for healthy eating . Avoid sugary drinks and desserts . Incorporate balanced protein, non starchy veggies, 1 serving of carbohydrate with each meal . Increase water intake . Increase physical activity as able  Patient-reported exercise habits: n/a  O:  Lab Results  Component Value Date   HGBA1C 9.4 (H) 02/13/2021    There were no vitals filed for this visit.     Lipid Panel     Component Value Date/Time   CHOL 91 (L) 08/08/2020 1035   TRIG 134 08/08/2020 1035   TRIG 211 (H) 12/30/2014 1537   HDL 36 (L) 08/08/2020 1035   HDL 48 12/30/2014 1537   CHOLHDL 2.5 08/08/2020 1035   CHOLHDL 4.5 04/09/2013 1135   VLDL 37 04/09/2013 1135   LDLCALC 31 08/08/2020 1035   LDLCALC 42 08/28/2014 1037    Home fasting blood sugars: 107  2 hour post-meal/random blood sugars: 165.     A/P:  Diabetes T2DM currently uncontrolled, but improving per patient and spouse report. Patient is able to verbalize appropriate hypoglycemia management plan. Patient is adherent with medication. Control is suboptimal due to diet/lifestyle.  -Continue basaglar 45UNITS NIGHTLY (patient gets via [employer] patient assistance)  -Continue Rybelsus 3 mg daily, plan to increase to 7mg  daily  Denies history of thyroid cancer  Tolerating  well  Application submitted for patient assistance program (rybelsus 7mg  daily)  -Continue metformin gfr 62  -Extensively discussed pathophysiology of diabetes, recommended lifestyle interventions, dietary effects on blood sugar control  -Counseled on s/sx of and management of hypoglycemia  -Next A1C anticipated 3 months   Written patient instructions provided.  Total time in face to face counseling 30 minutes.    [employer], PharmD, BCPS Clinical Pharmacist, [facility]  II Phone [phone]

## 2021-03-18 ENCOUNTER — Ambulatory Visit (INDEPENDENT_AMBULATORY_CARE_PROVIDER_SITE_OTHER): Payer: HMO | Admitting: Pharmacist

## 2021-03-18 DIAGNOSIS — E1142 Type 2 diabetes mellitus with diabetic polyneuropathy: Secondary | ICD-10-CM

## 2021-03-18 NOTE — Patient Instructions (Signed)
Visit Information  PATIENT GOALS: Goals Addressed              This Visit's Progress     Patient Stated   .  "I want to manage my blood sugar better" (pt-stated)        CARE PLAN ENTRY (see longitudinal plan of care for additional care plan information)  Current Barriers:  . Chronic Disease Management support and education needs related to diabetes  Nurse Case Manager Clinical Goal(s):  Marland Kitchen Over the next 30 days, patient will work with [facility] PharmD to address needs related to diabetes management . Over the next 60 days, patient will work with Medical illustrator to coordinate care regarding diabetes management  Interventions:  . Inter-disciplinary care team collaboration (see longitudinal plan of care) . Chart reviewed including recent office notes and lab results . Talked with patient by telephone . Reviewed and discussed medications o Started rybelsus--tolerating well, will submit for patient assistance via [employer] nordisk - Denies history of thyroid cancer/MTC - Will increase rybelsus to 7mg  daily o She was approved for prescription assistance for Basaglar insulin via [employer] PAP through 11/07/21 o BG is now at goal per patient's reported BG/glucometer (one touch verio) . Discussed that patient is considering switching over to [facility] from [place] . Collaboration with PharmD to provide an update . Encouraged patient to check and record blood sugar twice daily as directed once she receives the glucometer . Notify PCP office [phone] of any readings outside of recommended range . Provided with CCM contact information and encouraged to reach out as needed  Patient Self Care Activities:  . Performs ADL's independently . Performs IADL's independently  Please see past updates related to this goal by clicking on the "Past Updates" button in the selected goal         The patient verbalized understanding of instructions, educational materials, and care plan provided  today and declined offer to receive copy of patient instructions, educational materials, and care plan.   Telephone follow up appointment with care management team member scheduled for: 03/25/21  Signature 03/27/21, PharmD, BCPS Clinical Pharmacist, [facility]  II Phone [phone]

## 2021-03-18 NOTE — Progress Notes (Signed)
Chronic Care Management   Follow Up Note   03/18/2021 Name: [name] MRN: [id number] DOB: Jan 15, 1961  Referred by: Janora Norlander, DO Reason for referral : DIABETES   [name] is a 60 y.o. year old female who is a primary care patient of Janora Norlander, DO. The CCM team was consulted for assistance with chronic disease management and care coordination needs.    Review of patient status, including review of consultants reports, relevant laboratory and other test results, and collaboration with appropriate care team members and the patient's provider was performed as part of comprehensive patient evaluation and provision of chronic care management services.    SDOH (Social Determinants of Health) assessments performed: Yes See Care Plan activities for detailed interventions related to [facility])     Outpatient Encounter Medications as of 03/18/2021  Medication Sig  . acetaminophen (TYLENOL) 650 MG CR tablet Take 650 mg by mouth every 8 (eight) hours as needed for pain.  Marland Kitchen albuterol (VENTOLIN HFA) 108 (90 Base) MCG/ACT inhaler Inhale 2 puffs into the lungs every 6 (six) hours as needed for wheezing or shortness of breath (for rescue).  . Alpha-Lipoic Acid 600 MG CAPS Take 1 capsule (600 mg total) by mouth daily. For diabetic neuropathy  . aspirin EC 81 MG tablet Take 1 tablet (81 mg total) by mouth daily.  . Blood Glucose Monitoring Suppl (ONETOUCH VERIO) w/Device KIT Use to test blood sugar twice daily as directed; DX: E11.69  . diclofenac sodium (VOLTAREN) 1 % GEL Apply 4 g topically 4 (four) times daily.  . furosemide (LASIX) 40 MG tablet TAKE 1 TABLET BY MOUTH ONCE DAILY AS NEEDED FOR EDEMA OR  FLUID (Patient taking differently: Take 40 mg by mouth 2 (two) times daily.)  . gabapentin (NEURONTIN) 600 MG tablet Take 1.5 tablets (900 mg total) by mouth 3 (three) times daily.  Marland Kitchen glimepiride (AMARYL) 2 MG tablet TAKE 1 TABLET BY MOUTH ONCE DAILY WITH BREAKFAST .  . glucose  blood (ONETOUCH VERIO) test strip Use to test blood sugar twice daily as directed; DX: E11.69  . Insulin Glargine (BASAGLAR KWIKPEN) 100 UNIT/ML Inject 41-50 Units into the skin at bedtime.  . metFORMIN (GLUCOPHAGE-XR) 500 MG 24 hr tablet TAKE 2 TABLETS BY MOUTH ONCE DAILY WITH BREAKFAST  . metoprolol tartrate (LOPRESSOR) 25 MG tablet Take 1 tablet by mouth twice daily  . nitroGLYCERIN (NITROSTAT) 0.4 MG SL tablet Place 1 tablet (0.4 mg total) under the tongue every 5 (five) minutes as needed for chest pain.  Marland Kitchen omeprazole (PRILOSEC) 40 MG capsule TAKE 1 CAPSULE BY MOUTH 30 MINUTES ONCE DAILY BEFORE MEAL(S)  . OneTouch Delica Lancets 31V MISC Use to test blood sugar twice daily as directed; DX: E11.69  . OneTouch Delica Lancets 40G MISC Check blood sugar BID and prn  E11.9  . rosuvastatin (CRESTOR) 20 MG tablet TAKE 1 TABLET BY MOUTH AT BEDTIME  . telmisartan (MICARDIS) 40 MG tablet Take 1 tablet (40 mg total) by mouth daily.   No facility-administered encounter medications on file as of 03/18/2021.     Objective:   Goals Addressed              This Visit's Progress     Patient Stated   .  "I want to manage my blood sugar better" (pt-stated)        CARE PLAN ENTRY (see longitudinal plan of care for additional care plan information)  Current Barriers:  . Chronic Disease Management support and  education needs related to diabetes  Nurse Case Manager Clinical Goal(s):  Marland Kitchen Over the next 30 days, patient will work with [facility] PharmD to address needs related to diabetes management . Over the next 60 days, patient will work with Consulting civil engineer to coordinate care regarding diabetes management  Interventions:  . Inter-disciplinary care team collaboration (see longitudinal plan of care) . Chart reviewed including recent office notes and lab results . Talked with patient by telephone . Reviewed and discussed medications o Started rybelsus 3MG DAILY--tolerating well, will submit for patient  assistance via novo nordisk - Denies history of thyroid cancer/MTC - Will increase rybelsus to 93m daily o She was approved for prescription assistance for Basaglar insulin via [employer] PAP through 11/07/21 o BG is now at goal per patient's reported BG/glucometer (one touch verio)--A1c has increased since last check, however with addition of Rybelsus, she is gaining better control . D[facility] [place]. Collaboration with PharmD to provide an update . Encouraged patient to check and record blood sugar twice daily as directed once she receives the glucometer . Notify PCP office [phone] any readings outside of recommended range . Provided with CCM contact information and encouraged to reach out as needed  Patient Self Care Activities:  . Performs ADL's independently . Performs IADL's independently  Please see past updates related to this goal by clicking on the "Past Updates" button in the selected goal          Plan:   Telephone follow up appointment with care management team member scheduled for: 03/25/21   SIGNATURE  JRegina Eck PharmD, BCPS Clinical Pharmacist, [place] Phone [phone]

## 2021-03-24 ENCOUNTER — Other Ambulatory Visit: Payer: Self-pay

## 2021-03-24 ENCOUNTER — Telehealth: Payer: Self-pay | Admitting: Pharmacist

## 2021-03-24 NOTE — Telephone Encounter (Signed)
Patient approved for [employer] Patient Assistance on rybelsus until 11/07/20 (free medication) Rybelsus 63-months supply  Medication to ship to PCP in 10-14 business days Will copy letter in media tab

## 2021-03-25 ENCOUNTER — Ambulatory Visit: Payer: HMO | Admitting: Pharmacist

## 2021-03-25 DIAGNOSIS — E119 Type 2 diabetes mellitus without complications: Secondary | ICD-10-CM | POA: Diagnosis not present

## 2021-03-25 DIAGNOSIS — E1142 Type 2 diabetes mellitus with diabetic polyneuropathy: Secondary | ICD-10-CM

## 2021-03-25 NOTE — Patient Instructions (Signed)
Visit Information  PATIENT GOALS: Goals Addressed              This Visit's Progress     Patient Stated   .  "I want to manage my blood sugar better" (pt-stated)        CARE PLAN ENTRY (see longitudinal plan of care for additional care plan information)  Current Barriers:  . Chronic Disease Management support and education needs related to diabetes  Nurse Case Manager Clinical Goal(s):  Marland Kitchen Over the next 90 days, patient will work with [facility] PharmD to address needs related to diabetes management . Over the next 60 days, patient will work with Consulting civil engineer to coordinate care regarding diabetes management  Interventions:  . Inter-disciplinary care team collaboration (see longitudinal plan of care) . Chart reviewed including recent office notes and lab results . Reviewed and discussed medications o Started rybelsus--tolerating well, will submit for patient assistance via novo nordisk-->patient approved for [employer] patient assistance program; medication will ship in 10-14 business days to PCP office - Denies history of thyroid cancer/MTC - Will increase rybelsus to 36m daily o She was approved for prescription assistance for Basaglar insulin via [employer] PAP through 11/07/21 o BG is now at goal per patient's reported BG/glucometer (one touch verio)--A1c has increased since last check, however with addition of Rybelsus, she is gaining better control . Discussed that [facility] [place]. Encouraged patient to check and record blood sugar twice daily as directed once she receives the glucometer . Notify PCP office [phone] any readings outside of recommended range . Provided with CCM contact information and encouraged to reach out as needed  Patient Self Care Activities:  . Performs ADL's independently . Performs IADL's independently  Please see past updates related to this goal by clicking on the "Past Updates"  button in the selected goal      .  I want to manage my blood sugar (pt-stated)        CARE PLAN ENTRY (see longitudinal plan of care for additional care plan information)  Current Barriers:  . Social, financial, community barriers:  . Diabetes: [id number] complicated by chronic medical conditions including HTN,HLD most recent A1c 7.9% . Most recent eGFR: 62 . Current antihyperglycemic regimen: trulictiy 02.83MOsq weekly, metformin 1g BID, farxiga 541m glimepiride 71m53m Patient notes that insurance will cover jardiance for next year o Patient with recent yeast infections since starting farxiga.  She is experiencing additional burning/itching.  Will STOP farxiga o Patient could not tolerate Trulicitiy-->discontinued due to GI o Patient is on insulin and Rybelsus now - Denies personal and family history of Medullary thyroid cancer (MTC) -  . Denies hypoglycemic symptoms/ denies hyperglycemic symptoms . Current exercise: n/a due to back pain . Current blood glucose readings: hasn't checked recently . Cardiovascular risk reduction: o Current hypertensive regimen: metoprolol, telmisartan o Current hyperlipidemia regimen: rosuvastatin 16m81mCurrent antiplatelet regimen: ASA 81mg107marmacist Clinical Goal(s):  . OveMarland Kitchen the next 90 days, patient will work with PharmD and primary care provider to address needs related to DM management  Interventions: . Comprehensive medication review performed, medication list updated in electronic medical record . Inter-disciplinary care team collaboration (see longitudinal plan of care) . Scheduled patient appt for December 6th to review blood sugars and ensure medications are secured for next year . Clotrimazole external cream/Fluconazole RXs sent to pharmacy to help relieve patient symptoms-->send to PCP for cosign . Encouraged patient  to watch blood sugar, increase water intake-->instructed patient to hold farxiga.  We will add insulin.  Patient to come in  for instruction/teaching on 10/13/20. I will follow up with patient next week.   . Discussed adding insulin at next visit  . Discussed meal planning options and Plate method for healthy eating . Avoid sugary drinks and desserts . Incorporate balanced protein, non starchy veggies, 1 serving of carbohydrate with each meal . Increase water intake . Increase physical activity as able  Patient Self Care Activities:  . Patient will check blood glucose daily, document, and provide at future appointments . Patient will focus on medication adherence by continuing to take medications as prescribed . Patient will take medications as prescribed . Patient will contact provider with any episodes of hypoglycemia . Patient will report any questions or concerns to provider   Please see past updates related to this goal by clicking on the "Past Updates" button in the selected goal         The patient verbalized understanding of instructions, educational materials, and care plan provided today and declined offer to receive copy of patient instructions, educational materials, and care plan.   Telephone follow up appointment with care management team member scheduled for: 04/01/21  Regina Eck, PharmD, BCPS Clinical Pharmacist, [place]  II Phone [phone]

## 2021-03-25 NOTE — Progress Notes (Signed)
Chronic Care Management   Follow Up Note   03/25/2021 Name: [name] MRN: [id number] DOB: 1961-04-17  Referred by: Janora Norlander, DO Reason for referral : Diabetes    WELDA AZZARELLO is a 60 y.o. year old female who is a primary care patient of Janora Norlander, DO. The CCM team was consulted for assistance with chronic disease management and care coordination needs.    Review of patient status, including review of consultants reports, relevant laboratory and other test results, and collaboration with appropriate care team members and the patient's provider was performed as part of comprehensive patient evaluation and provision of chronic care management services.    SDOH (Social Determinants of Health) assessments performed: No See Care Plan activities for detailed interventions related to [facility])     Outpatient Encounter Medications as of 03/25/2021  Medication Sig  . acetaminophen (TYLENOL) 325 MG tablet Take 975 mg by mouth every 8 (eight) hours as needed.  Marland Kitchen albuterol (VENTOLIN HFA) 108 (90 Base) MCG/ACT inhaler Inhale 2 puffs into the lungs every 6 (six) hours as needed for wheezing or shortness of breath (for rescue).  . Alpha-Lipoic Acid 600 MG CAPS Take 1 capsule (600 mg total) by mouth daily. For diabetic neuropathy  . aspirin EC 81 MG tablet Take 1 tablet (81 mg total) by mouth daily.  . Blood Glucose Monitoring Suppl (ONETOUCH VERIO) w/Device KIT Use to test blood sugar twice daily as directed; DX: E11.69  . diclofenac sodium (VOLTAREN) 1 % GEL Apply 4 g topically 4 (four) times daily.  . furosemide (LASIX) 40 MG tablet TAKE 1 TABLET BY MOUTH ONCE DAILY AS NEEDED FOR EDEMA OR  FLUID (Patient taking differently: Take 40 mg by mouth 2 (two) times daily.)  . gabapentin (NEURONTIN) 600 MG tablet Take 1.5 tablets (900 mg total) by mouth 3 (three) times daily.  Marland Kitchen glimepiride (AMARYL) 2 MG tablet TAKE 1 TABLET BY MOUTH ONCE DAILY WITH BREAKFAST .  . glucose blood (ONETOUCH  VERIO) test strip Use to test blood sugar twice daily as directed; DX: E11.69  . Insulin Glargine (BASAGLAR KWIKPEN) 100 UNIT/ML Inject 41-50 Units into the skin at bedtime.  . metFORMIN (GLUCOPHAGE-XR) 500 MG 24 hr tablet TAKE 2 TABLETS BY MOUTH ONCE DAILY WITH BREAKFAST  . metoprolol tartrate (LOPRESSOR) 25 MG tablet Take 1 tablet by mouth twice daily  . nitroGLYCERIN (NITROSTAT) 0.4 MG SL tablet Place 1 tablet (0.4 mg total) under the tongue every 5 (five) minutes as needed for chest pain.  Marland Kitchen omeprazole (PRILOSEC) 40 MG capsule TAKE 1 CAPSULE BY MOUTH 30 MINUTES ONCE DAILY BEFORE MEAL(S)  . OneTouch Delica Lancets 56Y MISC Use to test blood sugar twice daily as directed; DX: E11.69  . OneTouch Delica Lancets 56L MISC Check blood sugar BID and prn  E11.9  . rosuvastatin (CRESTOR) 20 MG tablet TAKE 1 TABLET BY MOUTH AT BEDTIME  . telmisartan (MICARDIS) 40 MG tablet Take 1 tablet (40 mg total) by mouth daily.   No facility-administered encounter medications on file as of 03/25/2021.     Objective:   Goals Addressed              This Visit's Progress     Patient Stated   .  "I want to manage my blood sugar better" (pt-stated)        CARE PLAN ENTRY (see longitudinal plan of care for additional care plan information)  Current Barriers:  . Chronic Disease Management support and education needs  related to diabetes  Nurse Case Manager Clinical Goal(s):  Marland Kitchen Over the next 90 days, patient will work with [facility] PharmD to address needs related to diabetes management . Over the next 60 days, patient will work with Consulting civil engineer to coordinate care regarding diabetes management  Interventions:  . Inter-disciplinary care team collaboration (see longitudinal plan of care) . Chart reviewed including recent office notes and lab results . Reviewed and discussed medications o Started rybelsus--tolerating well, will submit for patient assistance via novo nordisk-->patient approved for [employer]  patient assistance program; medication will ship in 10-14 business days to PCP office - Denies history of thyroid cancer/MTC - Will increase rybelsus to 90m daily o She was approved for prescription assistance for Basaglar insulin via [employer] PAP through 11/07/21 o BG is now at goal per patient's reported BG/glucometer (one touch verio)--A1c has increased since last check, however with addition of Rybelsus, she is gaining better control . [facility] [place] Encouraged patient to check and record blood sugar twice daily as directed once she receives the glucometer . Notify PCP office [phone] any readings outside of recommended range . Provided with CCM contact information and encouraged to reach out as needed  Patient Self Care Activities:  . Performs ADL's independently . Performs IADL's independently  Please see past updates related to this goal by clicking on the "Past Updates" button in the selected goal      .                 Plan:   Telephone follow up appointment with care management team member scheduled for: 04/01/21   SIGNATURE  JRegina Eck PharmD, BCPS Clinical Pharmacist, [place] Phone [phone]

## 2021-04-01 ENCOUNTER — Ambulatory Visit: Payer: Self-pay | Admitting: Pharmacist

## 2021-04-01 DIAGNOSIS — E119 Type 2 diabetes mellitus without complications: Secondary | ICD-10-CM | POA: Diagnosis not present

## 2021-04-01 DIAGNOSIS — E1142 Type 2 diabetes mellitus with diabetic polyneuropathy: Secondary | ICD-10-CM | POA: Diagnosis not present

## 2021-04-01 NOTE — Progress Notes (Signed)
Chronic Care Management   Follow Up Note  DIABETES MANAGEMENT   04/01/2021 Name: [name] MRN: [id number] DOB: 02/06/1961  Referred by: Gottschalk, Ashly M, DO Reason for referral : Diabetes    [name] is a 60 y.o. year old female who is a primary care patient of Gottschalk, Ashly M, DO. The CCM team was consulted for assistance with chronic disease management and care coordination needs.    Review of patient status, including review of consultants reports, relevant laboratory and other test results, and collaboration with appropriate care team members and the patient's provider was performed as part of comprehensive patient evaluation and provision of chronic care management services.    SDOH (Social Determinants of Health) assessments performed: No See Care Plan activities for detailed interventions related to SDOH)     Outpatient Encounter Medications as of 04/01/2021  Medication Sig  . Insulin Glargine (BASAGLAR KWIKPEN) 100 UNIT/ML Inject 41-50 Units into the skin at bedtime.  . acetaminophen (TYLENOL) 325 MG tablet Take 975 mg by mouth every 8 (eight) hours as needed.  . albuterol (VENTOLIN HFA) 108 (90 Base) MCG/ACT inhaler Inhale 2 puffs into the lungs every 6 (six) hours as needed for wheezing or shortness of breath (for rescue).  . Alpha-Lipoic Acid 600 MG CAPS Take 1 capsule (600 mg total) by mouth daily. For diabetic neuropathy  . aspirin EC 81 MG tablet Take 1 tablet (81 mg total) by mouth daily.  . Blood Glucose Monitoring Suppl (ONETOUCH VERIO) w/Device KIT Use to test blood sugar twice daily as directed; DX: E11.69  . diclofenac sodium (VOLTAREN) 1 % GEL Apply 4 g topically 4 (four) times daily.  . furosemide (LASIX) 40 MG tablet TAKE 1 TABLET BY MOUTH ONCE DAILY AS NEEDED FOR EDEMA OR  FLUID (Patient taking differently: Take 40 mg by mouth 2 (two) times daily.)  . gabapentin (NEURONTIN) 600 MG tablet Take 1.5 tablets (900 mg total) by mouth 3 (three) times  daily.  . glucose blood (ONETOUCH VERIO) test strip Use to test blood sugar twice daily as directed; DX: E11.69  . metFORMIN (GLUCOPHAGE-XR) 500 MG 24 hr tablet TAKE 2 TABLETS BY MOUTH ONCE DAILY WITH BREAKFAST  . metoprolol tartrate (LOPRESSOR) 25 MG tablet Take 1 tablet by mouth twice daily  . nitroGLYCERIN (NITROSTAT) 0.4 MG SL tablet Place 1 tablet (0.4 mg total) under the tongue every 5 (five) minutes as needed for chest pain.  . omeprazole (PRILOSEC) 40 MG capsule TAKE 1 CAPSULE BY MOUTH 30 MINUTES ONCE DAILY BEFORE MEAL(S)  . OneTouch Delica Lancets 30G MISC Use to test blood sugar twice daily as directed; DX: E11.69  . OneTouch Delica Lancets 33G MISC Check blood sugar BID and prn  E11.9  . rosuvastatin (CRESTOR) 20 MG tablet TAKE 1 TABLET BY MOUTH AT BEDTIME  . Semaglutide (RYBELSUS) 7 MG TABS Take 7 mg by mouth daily.  . telmisartan (MICARDIS) 40 MG tablet Take 1 tablet (40 mg total) by mouth daily.  . [DISCONTINUED] glimepiride (AMARYL) 2 MG tablet TAKE 1 TABLET BY MOUTH ONCE DAILY WITH BREAKFAST .   No facility-administered encounter medications on file as of 04/01/2021.     Objective:   Goals Addressed              This Visit's Progress     Patient Stated   .  "I want to manage my blood sugar better" (pt-stated)        CARE PLAN ENTRY (see longitudinal plan of care   for additional care plan information)  Current Barriers:  . Chronic Disease Management support and education needs related to diabetes  Nurse Case Manager Clinical Goal(s):  . Over the next 90 days, patient will work with [facility] PharmD to address needs related to diabetes management . Over the next 60 days, patient will work with RN Care Manager to coordinate care regarding diabetes management  Interventions:  . Inter-disciplinary care team collaboration (see longitudinal plan of care) . Chart reviewed including recent office notes and lab results . Reviewed and discussed medications o Started  rybelsus--tolerating well-->patient approved for Novo Nordisk patient assistance program; medication will ship in 10-14 business days to PCP office - Denies history of thyroid cancer/MTC - Will increase rybelsus to 7mg daily once shipment arrives - Discontinue glimepiride-removed from medication list - FBG 70-100s, post prandials higher in 200s--increase in Rybelsus should help with post prandials o She was approved for prescription assistance for Basaglar insulin via [employer] PAP through 11/07/21 o BG is now at goal per patient's reported BG/glucometer (one touch verio)--A1c has increased since last check, however with addition of Rybelsus, she is gaining better control . Discussed that patient is considering switching over to [facility] from [facility] . Encouraged patient to check and record blood sugar twice daily as directed once she receives the glucometer . Notify PCP office [phone] of any readings outside of recommended range . Provided with CCM contact information and encouraged to reach out as needed  Patient Self Care Activities:  . Performs ADL's independently . Performs IADL's independently  Please see past updates related to this goal by clicking on the "Past Updates" button in the selected goal          Plan:   Telephone follow up appointment with care management team member scheduled for: 04/15/21   SIGNATURE    [place] , PharmD, BCPS Clinical Pharmacist, [facility] [place]  [place] Phone [phone]   

## 2021-04-01 NOTE — Patient Instructions (Signed)
Visit Information  PATIENT GOALS: Goals Addressed              This Visit's Progress     Patient Stated   .  "I want to manage my blood sugar better" (pt-stated)        CARE PLAN ENTRY (see longitudinal plan of care for additional care plan information)  Current Barriers:  . Chronic Disease Management support and education needs related to diabetes  Nurse Case Manager Clinical Goal(s):  Marland Kitchen Over the next 90 days, patient will work with [facility] PharmD to address needs related to diabetes management . Over the next 60 days, patient will work with Medical illustrator to coordinate care regarding diabetes management  Interventions:  . Inter-disciplinary care team collaboration (see longitudinal plan of care) . Chart reviewed including recent office notes and lab results . Reviewed and discussed medications o Started rybelsus--tolerating well-->patient approved for [employer] patient assistance program; medication will ship in 10-14 business days to PCP office - Denies history of thyroid cancer/MTC - Will increase rybelsus to 7mg  daily once shipment arrives - Discontinue glimepiride-removed from medication list - FBG 70-100s, post prandials higher in 200s--increase in Rybelsus should help with post prandials o She was approved for prescription assistance for Basaglar insulin via [employer] PAP through 11/07/21 o BG is now at goal per patient's reported BG/glucometer (one touch verio)--A1c has increased since last check, however with addition of Rybelsus, she is gaining better control . Discussed that patient is considering switching over to [facility] from [place] . Encouraged patient to check and record blood sugar twice daily as directed once she receives the glucometer . Notify PCP office [phone] of any readings outside of recommended range . Provided with CCM contact information and encouraged to reach out as needed  Patient Self Care Activities:  . Performs ADL's  independently . Performs IADL's independently  Please see past updates related to this goal by clicking on the "Past Updates" button in the selected goal         The patient verbalized understanding of instructions, educational materials, and care plan provided today and declined offer to receive copy of patient instructions, educational materials, and care plan.   Telephone follow up appointment with care management team member scheduled for: 04/15/21  Signature 06/15/21, PharmD, BCPS Clinical Pharmacist, [facility]  [place] Phone [phone]

## 2021-04-14 ENCOUNTER — Encounter: Payer: Self-pay | Admitting: *Deleted

## 2021-04-15 ENCOUNTER — Ambulatory Visit (INDEPENDENT_AMBULATORY_CARE_PROVIDER_SITE_OTHER): Payer: HMO | Admitting: Pharmacist

## 2021-04-15 DIAGNOSIS — E1142 Type 2 diabetes mellitus with diabetic polyneuropathy: Secondary | ICD-10-CM | POA: Diagnosis not present

## 2021-04-16 ENCOUNTER — Telehealth: Payer: Self-pay | Admitting: Pharmacist

## 2021-04-16 NOTE — Telephone Encounter (Signed)
Novo Nordisk Patient Assistance Rybelsus 7mg  daily 76-month supply has arrived to PCP office  Patient verbalizes understanding and will pick up soon  Left up front with samples

## 2021-04-28 ENCOUNTER — Ambulatory Visit: Payer: Self-pay | Admitting: Pharmacist

## 2021-04-28 DIAGNOSIS — E1142 Type 2 diabetes mellitus with diabetic polyneuropathy: Secondary | ICD-10-CM

## 2021-04-28 NOTE — Progress Notes (Signed)
Chronic Care Management Pharmacy Note  04/15/2021 Name:  [name] MRN:  [id number] DOB:  11/05/1961  Summary: goals of T2DM discussed  Recommendations/Changes made from today's visit:   Increase rybelsus to 33m daily Started rybelsus--tolerating well-->okay to increase dose Denies history of thyroid cancer/MTC Patient shipment of rybelsus 735mgiven to patient in clinic (4 month supply) Discontinue glimepiride-removed from medication list FBG 70-100s, post prandials higher in 200s--increase in Rybelsus should help with post prandials  Plan: f/u with patient in 3 weeks; A1C recheck on 05/08/21 with PCP  Subjective: Stacey HASANs an [age].o. year old female who is a primary patient of GoJanora NorlanderDO.  The CCM team was consulted for assistance with disease management and care coordination needs.    Engaged with patient by telephone for follow up visit in response to provider referral for pharmacy case management and/or care coordination services.   Consent to Services:  The patient was given information about Chronic Care Management services, agreed to services, and gave verbal consent prior to initiation of services.  Please see initial visit note for detailed documentation.   Patient Care Team: GoJanora NorlanderDO as PCP - General (Family Medicine) McSatira SarkMD as PCP - Cardiology (Cardiology) YaMarcial PacasMD as Consulting Physician (Neurology) GiLatanya MaudlinMD as Consulting Physician (Orthopedic Surgery) McSatira SarkMD as Consulting Physician (Cardiology) HuIlean ChinaRN as Registered Nurse PrLavera GuiseRPCarroll County Memorial HospitalPharmacist)   Hospital visits: None in previous 6 months  Objective:  Lab Results  Component Value Date   CREATININE 0.98 08/08/2020   CREATININE 1.16 (H) 06/19/2020   CREATININE 0.99 06/18/2020    Lab Results  Component Value Date   HGBA1C 9.4 (H) 02/13/2021   Last diabetic Eye exam:  Lab Results  Component  Value Date/Time   HMDIABEYEEXA Retinopathy (A) 12/25/2018 12:00 AM    Last diabetic Foot exam: No results found for: HMDIABFOOTEX      Component Value Date/Time   CHOL 91 (L) 08/08/2020 1035   TRIG 134 08/08/2020 1035   TRIG 211 (H) 12/30/2014 1537   HDL 36 (L) 08/08/2020 1035   HDL 48 12/30/2014 1537   CHOLHDL 2.5 08/08/2020 1035   CHOLHDL 4.5 04/09/2013 1135   VLDL 37 04/09/2013 1135   LDLCALC 31 08/08/2020 1035   LDLCALC 42 08/28/2014 1037    Hepatic Function Latest Ref Rng & Units 08/08/2020 06/18/2020 03/03/2020  Total Protein 6.0 - 8.5 g/dL 6.4 6.7 6.5  Albumin 3.8 - 4.8 g/dL 4.0 3.8 3.9  AST 0 - 40 IU/L '13 17 20  ' ALT 0 - 32 IU/L '11 16 21  ' Alk Phosphatase 44 - 121 IU/L 105 77 122(H)  Total Bilirubin 0.0 - 1.2 mg/dL 0.2 0.4 0.2  Bilirubin, Direct 0.0 - 0.3 mg/dL - - -    Lab Results  Component Value Date/Time   TSH 5.31 (H) 06/05/2019 03:09 PM   TSH 8.526 (H) 04/09/2013 04:50 AM    CBC Latest Ref Rng & Units 02/13/2021 11/05/2020 08/08/2020  WBC 3.4 - 10.8 x10E3/uL 9.4 8.4 8.5  Hemoglobin 11.1 - 15.9 g/dL 12.9 13.6 12.8  Hematocrit 34.0 - 46.6 % 38.4 42.9 38.8  Platelets 150 - 450 x10E3/uL 101(L) 117(L) 118(L)    No results found for: VD25OH  Clinical ASCVD: No  The ASCVD Risk score (GMikey BussingC Jr., et al., 2013) failed to calculate for the following reasons:   The valid systolic blood pressure range is  90 to 200 mmHg   The valid total cholesterol range is 130 to 320 mg/dL    Other: (CHADS2VASc if Afib, PHQ9 if depression, MMRC or CAT for COPD, ACT, DEXA)  Social History   Tobacco Use  Smoking Status Every Day   Packs/day: 1.00   Years: 47.00   Pack years: 47.00   Types: Cigarettes   Start date: 05/14/1972  Smokeless Tobacco Never   BP Readings from Last 3 Encounters:  02/13/21 (!) 89/53  11/05/20 121/69  08/08/20 133/73   Pulse Readings from Last 3 Encounters:  02/13/21 81  11/05/20 71  08/08/20 71   Wt Readings from Last 3 Encounters:  02/13/21 (!)  323 lb (146.5 kg)  11/05/20 (!) 311 lb (141.1 kg)  08/08/20 (!) 323 lb 9.6 oz (146.8 kg)    Assessment: Review of patient past medical history, allergies, medications, health status, including review of consultants reports, laboratory and other test data, was performed as part of comprehensive evaluation and provision of chronic care management services.   SDOH:  (Social Determinants of Health) assessments and interventions performed:    CCM Care Plan  Allergies  Allergen Reactions   Farxiga [Dapagliflozin]     RECURRENT YEAST INFECTIONS   Morphine And Related Nausea And Vomiting    Medications Reviewed Today     Reviewed by Lavera Guise, [facility] (Pharmacist) on 04/28/21 at 0854  Med List Status: <None>   Medication Order Taking? Sig Documenting Provider Last Dose Status Informant  acetaminophen (TYLENOL) 325 MG tablet [id number]  Take 975 mg by mouth every 8 (eight) hours as needed. [provider]  Active   albuterol (VENTOLIN HFA) 108 (90 Base) MCG/ACT inhaler [id number]  Inhale 2 puffs into the lungs every 6 (six) hours as needed for wheezing or shortness of breath (for rescue). Ronnie Doss M, DO  Active   Alpha-Lipoic Acid 600 MG CAPS [id number]  Take 1 capsule (600 mg total) by mouth daily. For diabetic neuropathy Janora Norlander, DO  Active   aspirin EC 81 MG tablet [id number]  Take 1 tablet (81 mg total) by mouth daily. Verta Ellen., NP  Active   Blood Glucose Monitoring Suppl [facility] VERIO) w/Device Drucie Opitz [id number]  Use to test blood sugar twice daily as directed; DX: E11.69 Janora Norlander, DO  Active Self  diclofenac sodium (VOLTAREN) 1 % GEL [id number]  Apply 4 g topically 4 (four) times daily. Ronnie Doss M, DO  Active Self  furosemide (LASIX) 40 MG tablet [id number]  TAKE 1 TABLET BY MOUTH ONCE DAILY AS NEEDED FOR EDEMA OR  FLUID  Patient taking differently: Take 40 mg by mouth 2 (two) times daily.   Ronnie Doss M, DO  Active    gabapentin (NEURONTIN) 600 MG tablet [id number]  Take 1.5 tablets (900 mg total) by mouth 3 (three) times daily. Ronnie Doss M, DO  Active   glucose blood (ONETOUCH VERIO) test strip [id number]  Use to test blood sugar twice daily as directed; DX: E11.69 Janora Norlander, DO  Active Self  Insulin Glargine (BASAGLAR KWIKPEN) 100 UNIT/ML [id number] Yes Inject 41-50 Units into the skin at bedtime. Ronnie Doss M, DO Taking Active   metFORMIN (GLUCOPHAGE-XR) 500 MG 24 hr tablet [id number]  TAKE 2 TABLETS BY MOUTH ONCE DAILY WITH BREAKFAST Ronnie Doss M, DO  Active   metoprolol tartrate (LOPRESSOR) 25 MG tablet [id number]  Take 1 tablet by mouth twice daily Gottschalk, Ashly M, DO  Active   nitroGLYCERIN (NITROSTAT) 0.4  MG SL tablet [id number]  Place 1 tablet (0.4 mg total) under the tongue every 5 (five) minutes as needed for chest pain. Verta Ellen., NP  Active   omeprazole (PRILOSEC) 40 MG capsule [id number]  TAKE 1 CAPSULE BY MOUTH 30 MINUTES ONCE DAILY BEFORE MEAL(S) Janora Norlander, DO  Active   OneTouch Delica Lancets 17C [place] [id number]  Use to test blood sugar twice daily as directed; DX: E11.69 Janora Norlander, DO  Active Self  OneTouch Delica Lancets 63W MISC [id number]  Check blood sugar BID and prn  E11.9 Ronnie Doss M, DO  Active Self  rosuvastatin (CRESTOR) 20 MG tablet [id number]  TAKE 1 TABLET BY MOUTH AT BEDTIME Ronnie Doss M, DO  Active   Semaglutide (RYBELSUS) 7 MG TABS [id number]  Take 7 mg by mouth daily. [provider]  Active   telmisartan (MICARDIS) 40 MG tablet [id number]  Take 1 tablet (40 mg total) by mouth daily. Janora Norlander, DO  Active             Patient Active Problem List   Diagnosis Date Noted   Unstable angina ([place]) 06/18/2020   Moderate nonproliferative diabetic retinopathy of both eyes without macular edema associated with type 2 diabetes mellitus ([place]) 01/05/2019   Hypertension associated with diabetes  ([place]) 09/11/2015   Spinal stenosis, lumbar region, with neurogenic claudication 04/25/2015   Arthritis associated with diabetes ([place]) 08/28/2014   Peripheral edema 08/28/2014   Diabetic neuropathy ([place]) 02/18/2014   External bleeding hemorrhoids 04/10/2013   Morbid (severe) obesity due to excess calories ([place]) 04/09/2013   Diabetes ([place]) 04/09/2013   Thrombocytopenia, unspecified ([place]) 04/09/2013   Chest pain 04/09/2013   OSA (obstructive sleep apnea) 07/06/2011   DOE (dyspnea on exertion) 06/06/2011   Coronary artery disease 02/03/2011   GERD (gastroesophageal reflux disease) 02/03/2011   Hyperlipidemia associated with type 2 diabetes mellitus ([place]) 02/03/2011   Noncompliance 02/03/2011   Generalized anxiety disorder 02/03/2011   Insomnia 02/03/2011   Cigarette smoker 02/03/2011    Immunization History  Administered Date(s) Administered   Influenza,inj,Quad PF,6+ Mos 08/10/2013, 08/28/2014, 09/25/2015, 11/23/2017, 11/27/2018   Pneumococcal Conjugate-13 11/27/2018    Conditions to be addressed/monitored: HTN, HLD, and DMII  Care Plan : PHARMD-MEDICATION MANAGEMENT  Updates made by Lavera Guise, [place] since 04/28/2021 12:00 AM     Problem: DISEASE PROGRESSION PREVENTION      Long-Range Goal: T2DM   Priority: High  Note:   Current Barriers:  Unable to independently afford treatment regimen Unable to achieve control of T2DM  Suboptimal therapeutic regimen for T2DM  Pharmacist Clinical Goal(s):  Over the next 90 days, patient will verbalize ability to afford treatment regimen achieve adherence to monitoring guidelines and medication adherence to achieve therapeutic efficacy achieve control of T2DM as evidenced by GOAL A1C  through collaboration with PharmD and provider.    Interventions: 1:1 collaboration with Janora Norlander, DO regarding development and update of comprehensive plan of care as evidenced by provider attestation and co-signature Inter-disciplinary  care team collaboration (see longitudinal plan of care) Comprehensive medication review performed; medication list updated in electronic medical record  Diabetes: Uncontrolled/controlled; current treatment:BASAGLAR, METFORMIN, RYBELSUS 3MG  Current glucose readings: fasting glucose: <150, post prandial glucose: <230 Denies hypoglycemic/hyperglycemic symptoms Current meal patterns: Discussed meal planning options and Plate method for healthy eating Avoid sugary drinks and desserts Incorporate balanced protein, non starchy veggies, 1 serving of carbohydrate with each meal Increase water intake Increase physical activity as able Current  exercise: unable due to pain; difficulty getting around; uses cane to walk Educated on medications; lifestyle modifications Recommended increase rybelsus to 11m daily Started rybelsus--tolerating well-->okay to increase dose Denies history of thyroid cancer/MTC Patient shipment of rybelsus 755mgiven to patient in clinic (4 month supply) Discontinue glimepiride-removed from medication list FBG 70-100s, post prandials higher in 200s--increase in Rybelsus should help with post prandials  Patient Goals/Self-Care Activities Over the next 90 days, patient will:  - take medications as prescribed focus on medication adherence by REDUCING A1C TO GOAL <7% check glucose DAILY (FASTING), document, and provide at future appointments  Follow Up Plan: Telephone follow up appointment with care management team member scheduled for: 3 WEEKS      Medication Assistance:  rybelsus obtained through [employer] medication assistance program.  Enrollment ends 11/07/21; last refill must be submitted by pcp office by 10/07/21 ; BANancee Literbtained through [place]ication assistance program.  Enrollment ends 11/07/21 (SHIPS TO PT HOME)  [facility]  [facility] [address]  [id number]: [phone]: [phone]  ([address]East [address]ol[address]morland[address] [place]hone: [phone]: [phone] - [phone]hone: [phone]: [phone] pill box? No - HUSBAND ASSISTS; PATIENT IS ABLE  Pt endorses 100% compliance  Follow Up:  Patient agrees to Care Plan and Follow-up.  Plan: Telephone follow up appointment with care management team member scheduled for:  04/28/21  JuRegina EckPharmD, BCPS Clinical Pharmacist, WeWindthorstII Phone [phone]

## 2021-04-28 NOTE — Patient Instructions (Signed)
Visit Information  PATIENT GOALS:  Goals Addressed               This Visit's Progress     Patient Stated     "I want to manage my blood sugar better" (pt-stated)        Current Barriers:  Unable to independently afford treatment regimen Unable to achieve control of T2DM  Suboptimal therapeutic regimen for T2DM  Pharmacist Clinical Goal(s):  Over the next 90 days, patient will verbalize ability to afford treatment regimen achieve adherence to monitoring guidelines and medication adherence to achieve therapeutic efficacy achieve control of T2DM as evidenced by GOAL A1C through collaboration with PharmD and provider.    Interventions: 1:1 collaboration with [name], DO regarding development and update of comprehensive plan of care as evidenced by provider attestation and co-signature Inter-disciplinary care team collaboration (see longitudinal plan of care) Comprehensive medication review performed; medication list updated in electronic medical record  Diabetes: Uncontrolled/controlled; current treatment:BASAGLAR, METFORMIN, RYBELSUS 3MG   Current glucose readings: fasting glucose: <150, post prandial glucose: <230 Denies hypoglycemic/hyperglycemic symptoms Current meal patterns: Discussed meal planning options and Plate method for healthy eating Avoid sugary drinks and desserts Incorporate balanced protein, non starchy veggies, 1 serving of carbohydrate with each meal Increase water intake Increase physical activity as able Current exercise: unable due to pain; difficulty getting around; uses cane to walk Educated on medications; lifestyle modifications Recommended increase rybelsus to 7mg  daily Started rybelsus--tolerating well-->okay to increase dose Denies history of thyroid cancer/MTC Patient shipment of rybelsus 7mg  given to patient in clinic (4 month supply) Discontinue glimepiride-removed from medication list FBG 70-100s, post prandials higher in  200s--increase in Rybelsus should help with post prandials  Patient Goals/Self-Care Activities Over the next 90 days, patient will:  - take medications as prescribed focus on medication adherence by REDUCING A1C TO GOAL <7% check glucose DAILY (FASTING), document, and provide at future appointments  Follow Up Plan: Telephone follow up appointment with care management team member scheduled for: 3 WEEKS          The patient verbalized understanding of instructions, educational materials, and care plan provided today and declined offer to receive copy of patient instructions, educational materials, and care plan.   Telephone follow up appointment with care management team member scheduled for: 04/28/21  , PharmD, BCPS Clinical Pharmacist, [facility]  II Phone [phone]

## 2021-05-01 NOTE — Progress Notes (Signed)
Chronic Care Management Pharmacy Note  05/01/2021 Name:  [name] MRN:  [id number] DOB:  December 14, 1960  Summary: diabetes management   Recommendations/Changes made from today's visit: Diabetes: Uncontrolled/controlled; current treatment:BASAGLAR 45 units , METFORMIN, RYBELSUS 3MG  Current glucose readings: fasting glucose: <179, post prandial glucose: <200 Denies hypoglycemic/hyperglycemic symptoms Current meal patterns: Discussed meal planning options and Plate Method for healthy eating Avoid sugary drinks and desserts Incorporate balanced protein, non starchy veggies, 1 serving of carbohydrate with each meal Increase water intake Increase physical activity as able Current exercise: unable due to pain; difficulty getting around; uses cane to walk Educated on medications; lifestyle modifications Recommended increase rybelsus to 60m daily Started rybelsus 371mdaily--tolerating well-->okay to increase dose Denies personal and family history of Medullary thyroid cancer (MTC) Discontinued glimepiride-removed from medication list FBG increased now up to 180, post prandials higher in 200s--increase in Rybelsus should help with post prandials; patient instructed to start 53m31maily of Rybelsus Patient enrolled in [employer] for Bas[place] 11/07/21 Patient enrolled in novHarmonyr Rybelsus until 11/07/21  Plan: f/u next month  Subjective: [name] an [age] year old female who is a primary patient of GotJanora NorlanderO.  The CCM team was consulted for assistance with disease management and care coordination needs.    Engaged with patient by telephone for follow up visit in response to provider referral for pharmacy case management and/or care coordination services.   Consent to Services:  The patient was given information about Chronic Care Management services, agreed to services, and gave verbal consent prior to initiation of services.   Please see initial visit note for detailed documentation.   Patient Care Team: GotJanora NorlanderO as PCP - General (Family Medicine) McDSatira SarkD as PCP - Cardiology (Cardiology) YanMarcial PacasD as Consulting Physician (Neurology) GioLatanya MaudlinD as Consulting Physician (Orthopedic Surgery) McDSatira SarkD as Consulting Physician (Cardiology) HudIlean ChinaN as Registered Nurse P[facility]Lavera [facility]PHL[facility])  Recent office visits: Future PCP OV 05/08/21  [facility] None in previous 6 months  Objective:  Lab Results  Component Value Date   CREATININE 0.98 08/08/2020   CREATININE 1.16 (H) 06/19/2020   CREATININE 0.99 06/18/2020    Lab Results  Component Value Date   HGBA1C 9.4 (H) 02/13/2021   Last diabetic Eye exam:  Lab Results  Component Value Date/Time   HMDIABEYEEXA Retinopathy (A) 12/25/2018 12:00 AM    Last diabetic Foot exam: No results found for: HMDIABFOOTEX      Component Value Date/Time   CHOL 91 (L) 08/08/2020 1035   TRIG 134 08/08/2020 1035   TRIG 211 (H) 12/30/2014 1537   HDL 36 (L) 08/08/2020 1035   HDL 48 12/30/2014 1537   CHOLHDL 2.5 08/08/2020 1035   CHOLHDL 4.5 04/09/2013 1135   VLDL 37 04/09/2013 1135   LDLCALC 31 08/08/2020 1035   LDLCALC 42 08/28/2014 1037    Hepatic Function Latest Ref Rng & Units 08/08/2020 06/18/2020 03/03/2020  Total Protein 6.0 - 8.5 g/dL 6.4 6.7 6.5  Albumin 3.8 - 4.8 g/dL 4.0 3.8 3.9  AST 0 - 40 IU/L _0 ALT 0 - 32 IU/L _1 Alk Phosphatase 44 - 121 IU/L 105 77 122(H)  Total Bilirubin 0.0 - 1.2 mg/dL 0.2 0.4 0.2  Bilirubin, Direct 0.0 - 0.3 mg/dL - - -    Lab Results  Component Value Date/Time   TSH 5.31 (H) 06/05/2019 03:09 PM   TSH 8.526 (H) 04/09/2013 04:50 AM    CBC Latest Ref Rng & Units 02/13/2021 11/05/2020 08/08/2020  WBC 3.4 - 10.8 x10E3/uL 9.4 8.4 8.5  Hemoglobin 11.1 - 15.9 g/dL 12.9 13.6 12.8  Hematocrit 34.0 - 46.6 %  38.4 42.9 38.8  Platelets 150 - 450 x10E3/uL 101(L) 117(L) 118(L)    No results found for: VD25OH  Clinical ASCVD: No  The ASCVD Risk score Mikey Bussing DC Jr., et al., 2013) failed to calculate for the following reasons:   The valid systolic blood pressure range is 90 to 200 mmHg   The valid total cholesterol range is 130 to 320 mg/dL    Other: (CHADS2VASc if Afib, PHQ9 if depression, MMRC or CAT for COPD, ACT, DEXA)  Social History   Tobacco Use  Smoking Status Every Day   Packs/day: 1.00   Years: 47.00   Pack years: 47.00   Types: Cigarettes   Start date: 05/14/1972  Smokeless Tobacco Never   BP Readings from Last 3 Encounters:  02/13/21 (!) 89/53  11/05/20 121/69  08/08/20 133/73   Pulse Readings from Last 3 Encounters:  02/13/21 81  11/05/20 71  08/08/20 71   Wt Readings from Last 3 Encounters:  02/13/21 (!) 323 lb (146.5 kg)  11/05/20 (!) 311 lb (141.1 kg)  08/08/20 (!) 323 lb 9.6 oz (146.8 kg)    Assessment: Review of patient past medical history, allergies, medications, health status, including review of consultants reports, laboratory and other test data, was performed as part of comprehensive evaluation and provision of chronic care management services.   SDOH:  (Social Determinants of Health) assessments and interventions performed:    CCM Care Plan  Allergies  Allergen Reactions   Farxiga [Dapagliflozin]     RECURRENT YEAST INFECTIONS   Morphine And Related Nausea And Vomiting    Medications Reviewed Today     Reviewed by Lavera Guise, [facility] (Pharmacist) on 05/01/21 at 1219  Med List Status: <None>   Medication Order Taking? Sig Documenting Provider Last Dose Status Informant  acetaminophen (TYLENOL) 325 MG tablet [id number] No Take 975 mg by mouth every 8 (eight) hours as needed. [provider] Taking Active   albuterol (VENTOLIN HFA) 108 (90 Base) MCG/ACT inhaler [id number] No Inhale 2 puffs into the lungs every 6 (six) hours as needed for  wheezing or shortness of breath (for rescue). Ronnie Doss M, DO Taking Active   Alpha-Lipoic Acid 600 MG CAPS [id number]  Take 1 capsule (600 mg total) by mouth daily. For diabetic neuropathy Janora Norlander, DO  Active   aspirin EC 81 MG tablet [id number] No Take 1 tablet (81 mg total) by mouth daily. Verta Ellen., NP Taking Active   Blood Glucose Monitoring Suppl [facility] VERIO) w/Device Drucie Opitz [id number] No Use to test blood sugar twice daily as directed; DX: E11.69 Janora Norlander, DO Taking Active Self  diclofenac sodium (VOLTAREN) 1 % GEL [id number] No Apply 4 g topically 4 (four) times daily. Ronnie Doss M, DO Taking Active Self  furosemide (LASIX) 40 MG tablet [id number] No TAKE 1 TABLET BY MOUTH ONCE DAILY AS NEEDED FOR EDEMA OR  FLUID  Patient taking differently: Take 40 mg by mouth 2 (two) times daily.   Ronnie Doss M, DO Taking Active   gabapentin (NEURONTIN) 600 MG tablet [id number]  Take 1.5 tablets (900 mg total) by mouth 3 (three) times daily. Ronnie Doss M, DO  Active   glucose blood (ONETOUCH VERIO) test strip [id number] No Use to test blood sugar twice daily as directed; DX: E11.69 Janora Norlander, DO Taking Active Self  Insulin Glargine (BASAGLAR KWIKPEN) 100 UNIT/ML [id number] No Inject 41-50 Units into the skin at bedtime. Ronnie Doss M, DO Taking Active   metFORMIN (GLUCOPHAGE-XR) 500 MG 24 hr tablet [id number] No TAKE 2 TABLETS BY MOUTH ONCE DAILY WITH BREAKFAST Ronnie Doss M, DO Taking Active   metoprolol tartrate (LOPRESSOR) 25 MG tablet [id number] No Take 1 tablet by mouth twice daily Gottschalk, Ashly M, DO Taking Active   nitroGLYCERIN (NITROSTAT) 0.4 MG SL tablet [id number] No Place 1 tablet (0.4 mg total) under the tongue every 5 (five) minutes as needed for chest pain. Verta Ellen., NP Taking Active   omeprazole (PRILOSEC) 40 MG capsule [id number] No TAKE 1 CAPSULE BY MOUTH 30 MINUTES ONCE DAILY BEFORE MEAL(S) Janora Norlander, DO Taking Active   OneTouch Delica Lancets 09O MISC [id number] No Use to test blood sugar twice daily as directed; DX: E11.69 Janora Norlander, DO Taking Active Self  OneTouch Delica Lancets 29U MISC [id number] No Check blood sugar BID and prn  E11.9 Ronnie Doss M, DO Taking Active Self  rosuvastatin (CRESTOR) 20 MG tablet [id number] No TAKE 1 TABLET BY MOUTH AT BEDTIME Ronnie Doss M, DO Taking Active   Semaglutide (RYBELSUS) 7 MG TABS [id number]  Take 7 mg by mouth daily. [provider]  Active   telmisartan (MICARDIS) 40 MG tablet [id number] No Take 1 tablet (40 mg total) by mouth daily. Janora Norlander, DO Taking Active             Patient Active Problem List   Diagnosis Date Noted   Unstable angina ([place]) 06/18/2020   Moderate nonproliferative diabetic retinopathy of both eyes without macular edema associated with type 2 diabetes mellitus ([place]) 01/05/2019   Hypertension associated with diabetes ([place]) 09/11/2015   Spinal stenosis, lumbar region, with neurogenic claudication 04/25/2015   Arthritis associated with diabetes ([place]) 08/28/2014   Peripheral edema 08/28/2014   Diabetic neuropathy ([place]) 02/18/2014   External bleeding hemorrhoids 04/10/2013   Morbid (severe) obesity due to excess calories ([place]) 04/09/2013   Diabetes ([place]) 04/09/2013   Thrombocytopenia, unspecified ([place]) 04/09/2013   Chest pain 04/09/2013   OSA (obstructive sleep apnea) 07/06/2011   DOE (dyspnea on exertion) 06/06/2011   Coronary artery disease 02/03/2011   GERD (gastroesophageal reflux disease) 02/03/2011   Hyperlipidemia associated with type 2 diabetes mellitus ([place]) 02/03/2011   Noncompliance 02/03/2011   Generalized anxiety disorder 02/03/2011   Insomnia 02/03/2011   Cigarette smoker 02/03/2011    Immunization History  Administered Date(s) Administered   Influenza,inj,Quad PF,6+ Mos 08/10/2013, 08/28/2014, 09/25/2015, 11/23/2017, 11/27/2018   Pneumococcal  Conjugate-13 11/27/2018    Conditions to be addressed/monitored: HTN, HLD, and DMII  Care Plan : PHARMD-MEDICATION MANAGEMENT  Updates made by Lavera Guise, [place] since 05/01/2021 12:00 AM     Problem: DISEASE PROGRESSION PREVENTION      Long-Range Goal: T2DM   Priority: High  Note:   Current Barriers:  Unable to independently afford treatment regimen Unable to achieve control of T2DM  Suboptimal therapeutic regimen for T2DM  Pharmacist Clinical Goal(s):  Over the next 90 days, patient will verbalize ability to afford treatment regimen achieve adherence to monitoring guidelines and medication adherence to achieve therapeutic efficacy achieve control of T2DM as evidenced by GOAL A1C  through collaboration with PharmD and provider.  Interventions: 1:1 collaboration with Janora Norlander, DO regarding development and update of comprehensive plan of care as evidenced by provider attestation and co-signature Inter-disciplinary care team collaboration (see longitudinal plan of care) Comprehensive medication review performed; medication list updated in electronic medical record  Diabetes: Uncontrolled/controlled; current treatment:BASAGLAR 45 units , METFORMIN, RYBELSUS 3MG  Current glucose readings: fasting glucose: <179, post prandial glucose: <200 Denies hypoglycemic/hyperglycemic symptoms Current meal patterns: Discussed meal planning options and Plate Method for healthy eating Avoid sugary drinks and desserts Incorporate balanced protein, non starchy veggies, 1 serving of carbohydrate with each meal Increase water intake Increase physical activity as able Current exercise: unable due to pain; difficulty getting around; uses cane to walk Educated on medications; lifestyle modifications Recommended increase rybelsus to 29m daily Started rybelsus--tolerating well-->okay to increase dose Denies history of thyroid cancer/MTC Patient shipment of rybelsus 766mgiven to  patient in clinic (4 month supply) Discontinue glimepiride-removed from medication list FBG increased now up to 180, post prandials higher in 200s--increase in Rybelsus should help with post prandials; patient instructed to start 84m95maily of Rybelsus  Patient Goals/Self-Care Activities Over the next 90 days, patient will:  - take medications as prescribed focus on medication adherence by REDUCING A1C TO GOAL <7% check glucose DAILY (FASTING), document, and provide at future appointments  Follow Up Plan: Telephone follow up appointment with care management team member scheduled for: 3 WEEKS      Medication Assistance: Patient enrolled in [employer] for BasDunlaptil 11/07/21 Patient enrolled in [place] Rybelsus until 11/07/21  Patient's preferred pharmacy is:  [facility] [id number]: [phone]: [phone]  ([address] [place] [address] [place]one: [phone]: [address] - [phone]one: [phone]: [phone] Up:  Patient agrees to Care Plan and Follow-up.  Plan: Telephone follow up appointment with care management team member scheduled for:  1 month    JulRegina EckharmD, BCPS Clinical Pharmacist, [place] Phone [phone]

## 2021-05-01 NOTE — Patient Instructions (Signed)
Visit Information  PATIENT GOALS:  Goals Addressed               This Visit's Progress     Patient Stated     T2DM-PharmD goal (pt-stated)        Current Barriers:  Unable to independently afford treatment regimen Unable to achieve control of T2DM  Suboptimal therapeutic regimen for T2DM  Pharmacist Clinical Goal(s):  Over the next 90 days, patient will verbalize ability to afford treatment regimen achieve adherence to monitoring guidelines and medication adherence to achieve therapeutic efficacy achieve control of T2DM as evidenced by GOAL A1C through collaboration with PharmD and provider.    Interventions: 1:1 collaboration with [name], DO regarding development and update of comprehensive plan of care as evidenced by provider attestation and co-signature Inter-disciplinary care team collaboration (see longitudinal plan of care) Comprehensive medication review performed; medication list updated in electronic medical record  Diabetes: Uncontrolled/controlled; current treatment:BASAGLAR 45 units , METFORMIN, RYBELSUS 3MG   Current glucose readings: fasting glucose: <179, post prandial glucose: <200 Denies hypoglycemic/hyperglycemic symptoms Current meal patterns: Discussed meal planning options and Plate Method for healthy eating Avoid sugary drinks and desserts Incorporate balanced protein, non starchy veggies, 1 serving of carbohydrate with each meal Increase water intake Increase physical activity as able Current exercise: unable due to pain; difficulty getting around; uses cane to walk Educated on medications; lifestyle modifications Recommended increase rybelsus to 7mg  daily Started rybelsus--tolerating well-->okay to increase dose Denies history of thyroid cancer/MTC Patient shipment of rybelsus 7mg  given to patient in clinic (4 month supply) Discontinue glimepiride-removed from medication list FBG increased now up to 180, post prandials higher in  200s--increase in Rybelsus should help with post prandials; patient instructed to start 7mg  daily of Rybelsus Patient enrolled in [employer] for Basaglar until 11/07/21 Patient enrolled in [place] foundation for Rybelsus until 11/07/21  Patient Goals/Self-Care Activities Over the next 90 days, patient will:  - take medications as prescribed focus on medication adherence by REDUCING A1C TO GOAL <7% check glucose DAILY (FASTING), document, and provide at future appointments  Follow Up Plan: Telephone follow up appointment with care management team member scheduled for: 3 WEEKS          The patient verbalized understanding of instructions, educational materials, and care plan provided today and declined offer to receive copy of patient instructions, educational materials, and care plan.   Telephone follow up appointment with care management team member scheduled for: 1 month   Signature , PharmD, BCPS Clinical Pharmacist, [facility]  II Phone [phone]

## 2021-05-08 ENCOUNTER — Ambulatory Visit: Payer: HMO | Admitting: Family Medicine

## 2021-05-15 ENCOUNTER — Ambulatory Visit: Payer: HMO | Admitting: Family Medicine

## 2021-06-12 ENCOUNTER — Other Ambulatory Visit: Payer: Self-pay

## 2021-06-12 ENCOUNTER — Encounter: Payer: Self-pay | Admitting: Family Medicine

## 2021-06-12 ENCOUNTER — Ambulatory Visit (INDEPENDENT_AMBULATORY_CARE_PROVIDER_SITE_OTHER): Payer: HMO | Admitting: Family Medicine

## 2021-06-12 VITALS — BP 131/76 | HR 94 | Temp 98.6°F | Ht 62.0 in | Wt 314.0 lb

## 2021-06-12 DIAGNOSIS — F329 Major depressive disorder, single episode, unspecified: Secondary | ICD-10-CM

## 2021-06-12 DIAGNOSIS — F411 Generalized anxiety disorder: Secondary | ICD-10-CM | POA: Diagnosis not present

## 2021-06-12 DIAGNOSIS — D696 Thrombocytopenia, unspecified: Secondary | ICD-10-CM

## 2021-06-12 DIAGNOSIS — E1142 Type 2 diabetes mellitus with diabetic polyneuropathy: Secondary | ICD-10-CM

## 2021-06-12 DIAGNOSIS — E1169 Type 2 diabetes mellitus with other specified complication: Secondary | ICD-10-CM

## 2021-06-12 DIAGNOSIS — R002 Palpitations: Secondary | ICD-10-CM

## 2021-06-12 DIAGNOSIS — F32A Depression, unspecified: Secondary | ICD-10-CM

## 2021-06-12 DIAGNOSIS — E785 Hyperlipidemia, unspecified: Secondary | ICD-10-CM

## 2021-06-12 DIAGNOSIS — E1159 Type 2 diabetes mellitus with other circulatory complications: Secondary | ICD-10-CM | POA: Diagnosis not present

## 2021-06-12 DIAGNOSIS — I152 Hypertension secondary to endocrine disorders: Secondary | ICD-10-CM | POA: Diagnosis not present

## 2021-06-12 LAB — BAYER DCA HB A1C WAIVED: HB A1C (BAYER DCA - WAIVED): 8.9 % — ABNORMAL HIGH (ref <7.0)

## 2021-06-12 MED ORDER — DULOXETINE HCL 60 MG PO CPEP: 60.0000 mg | ORAL_CAPSULE | Freq: Every day | ORAL | 1 refills | Status: DC

## 2021-06-12 NOTE — Progress Notes (Signed)
Subjective: CC: DM PCP: Janora Norlander, DO RCV:[name] is a 60 y.o. female presenting to clinic today for:  1. Type 2 Diabetes with hypertension, hyperlipidemia:  Patient reports that her blood sugar before arriving was 113.  She has been compliant with all of her medications.  Last eye exam: UTD Last foot exam: UTD Last A1c:  Lab Results  Component Value Date   HGBA1C 9.4 (H) 02/13/2021   Nephropathy screen indicated?: UTD Last flu, zoster and/or pneumovax:  Immunization History  Administered Date(s) Administered   Influenza,inj,Quad PF,6+ Mos 08/10/2013, 08/28/2014, 09/25/2015, 11/23/2017, 11/27/2018   Pneumococcal Conjugate-13 11/27/2018    ROS: No chest pain.  Breathing is at baseline.  She has chronic edema of the lower extremities.  2.  Depression/anxiety Patient reports that she tries to be strong and really does not like to admit that she has any issues with depression or anxiety.  She is not currently on the treatment for this and feels that her husband will be upset with her if she really divulged and how unhappy she is.  She is often criticized by her spouse.  Her children have recommended that she take action on multiple occasions but she has "been dealing with it for 45 years" and just goes to another room and watches TV.  She admits that this does weigh on her.  She is tearful today.  She is been having some intermittent heart fluttering and wonders if this is panic attacks.  She reports chronic pain in her back and lower legs.   ROS: Per HPI  Allergies  Allergen Reactions   Farxiga [Dapagliflozin]     RECURRENT YEAST INFECTIONS   Morphine And Related Nausea And Vomiting   Past Medical History:  Diagnosis Date   Arthritis    CAD (coronary artery disease)    BMS to circumflex 2007 - Dr. Olevia Perches   CKD (chronic kidney disease) stage 3, GFR 30-59 ml/min (HCC)    Essential hypertension    Falls frequently    GERD (gastroesophageal reflux disease)     HA (headache)    Hyperlipidemia    Left-sided face pain    Morbid obesity ([place])    Noncompliance    OSA (obstructive sleep apnea)    Uses CPAP   Type 2 diabetes mellitus (HCC)    Urinary incontinence     Current Outpatient Medications:    acetaminophen (TYLENOL) 325 MG tablet, Take 975 mg by mouth every 8 (eight) hours as needed., Disp: , Rfl:    albuterol (VENTOLIN HFA) 108 (90 Base) MCG/ACT inhaler, Inhale 2 puffs into the lungs every 6 (six) hours as needed for wheezing or shortness of breath (for rescue)., Disp: 1 each, Rfl: 0   Alpha-Lipoic Acid 600 MG CAPS, Take 1 capsule (600 mg total) by mouth daily. For diabetic neuropathy, Disp: 100 capsule, Rfl: 3   aspirin EC 81 MG tablet, Take 1 tablet (81 mg total) by mouth daily., Disp: , Rfl:    Blood Glucose Monitoring Suppl (ONETOUCH VERIO) w/Device KIT, Use to test blood sugar twice daily as directed; DX: E11.69, Disp: 1 kit, Rfl: 1   diclofenac sodium (VOLTAREN) 1 % GEL, Apply 4 g topically 4 (four) times daily., Disp: 400 g, Rfl: 2   furosemide (LASIX) 40 MG tablet, TAKE 1 TABLET BY MOUTH ONCE DAILY AS NEEDED FOR EDEMA OR  FLUID (Patient taking differently: Take 40 mg by mouth 2 (two) times daily.), Disp: 90 tablet, Rfl: 0   gabapentin (NEURONTIN)  600 MG tablet, Take 1.5 tablets (900 mg total) by mouth 3 (three) times daily., Disp: 270 tablet, Rfl: 3   glucose blood (ONETOUCH VERIO) test strip, Use to test blood sugar twice daily as directed; DX: E11.69, Disp: 100 each, Rfl: 5   Insulin Glargine (BASAGLAR KWIKPEN) 100 UNIT/ML, Inject 41-50 Units into the skin at bedtime., Disp: 45 mL, Rfl: 3   metFORMIN (GLUCOPHAGE-XR) 500 MG 24 hr tablet, TAKE 2 TABLETS BY MOUTH ONCE DAILY WITH BREAKFAST, Disp: 180 tablet, Rfl: 3   metoprolol tartrate (LOPRESSOR) 25 MG tablet, Take 1 tablet by mouth twice daily, Disp: 180 tablet, Rfl: 0   nitroGLYCERIN (NITROSTAT) 0.4 MG SL tablet, Place 1 tablet (0.4 mg total) under the tongue every 5 (five) minutes  as needed for chest pain., Disp: 25 tablet, Rfl: 3   omeprazole (PRILOSEC) 40 MG capsule, TAKE 1 CAPSULE BY MOUTH 30 MINUTES ONCE DAILY BEFORE MEAL(S), Disp: 90 capsule, Rfl: 3   OneTouch Delica Lancets 13K MISC, Use to test blood sugar twice daily as directed; DX: E11.69, Disp: 100 each, Rfl: 3   OneTouch Delica Lancets 44W MISC, Check blood sugar BID and prn  E11.9, Disp: 100 each, Rfl: 11   rosuvastatin (CRESTOR) 20 MG tablet, TAKE 1 TABLET BY MOUTH AT BEDTIME, Disp: 90 tablet, Rfl: 0   Semaglutide (RYBELSUS) 7 MG TABS, Take 7 mg by mouth daily., Disp: , Rfl:    telmisartan (MICARDIS) 40 MG tablet, Take 1 tablet (40 mg total) by mouth daily., Disp: 90 tablet, Rfl: 1 Social History   Socioeconomic History   Marital status: Married    Spouse name: Gwyndolyn Saxon   Number of children: 5   Years of education: 10 th   Highest education level: 10th grade  Occupational History   Occupation: disabled     Comment: disabled  Tobacco Use   Smoking status: Every Day    Packs/day: 1.00    Years: 47.00    Pack years: 47.00    Types: Cigarettes    Start date: 05/14/1972   Smokeless tobacco: Never  Vaping Use   Vaping Use: Never used  Substance and Sexual Activity   Alcohol use: No    Alcohol/week: 0.0 standard drinks   Drug use: No   Sexual activity: Not Currently    Birth control/protection: Surgical  Other Topics Concern   Not on file  Social History Narrative   Patient lives at home with her husband. Patient is disabled.   Patient has 10 th grade education.   Right handed.   Caffeine- one  cup of coffee and  One soda Dr.Pepper/ tea. daily   Social Determinants of Health   Financial Resource Strain: Not on file  Food Insecurity: Not on file  Transportation Needs: Not on file  Physical Activity: Not on file  Stress: Not on file  Social Connections: Not on file  Intimate Partner Violence: Not on file   Family History  Problem Relation Age of Onset   Coronary artery disease Father     Cancer - Colon Father    Diabetes Father    High Cholesterol Father    Breast cancer Mother    Arthritis Sister    Asthma Sister    High Cholesterol Daughter    Thyroid disease Daughter    Hiatal hernia Son    Congenital heart disease Son     Objective: Office vital signs reviewed. BP 131/76   Pulse 94   Temp 98.6 F (37 C)   Ht  _0  (1.575 m)   Wt (!) 314 lb (142.4 kg)   SpO2 97%   BMI 57.43 kg/m   Physical Examination:  General: Awake, alert, morbidly obese, No acute distress HEENT: Normal, sclera white Cardio: regular rate and rhythm, S1S2 heard, no murmurs appreciated Pulm: clear to auscultation bilaterally, no wheezes, rhonchi or rales; normal work of breathing on room air Extremities: warm, well perfused, pitting edema noted throughout the legs.  She has associated venous stasis changes to the skin  Assessment/ Plan: 60 y.o. female   Type 2 diabetes mellitus with diabetic polyneuropathy, without long-term current use of insulin (HCC) - Plan: Bayer DCA Hb A1c Waived, CMP14+EGFR, DULoxetine (CYMBALTA) 60 MG capsule  Hypertension associated with diabetes ([place]) - Plan: CMP14+EGFR  Hyperlipidemia associated with type 2 diabetes mellitus ([place]) - Plan: CMP14+EGFR  Thrombocytopenia ([place]) - Plan: CBC with Differential/Platelet  Generalized anxiety disorder - Plan: DULoxetine (CYMBALTA) 60 MG capsule  Depressive disorder - Plan: DULoxetine (CYMBALTA) 60 MG capsule  Heart palpitations  Sugars not at goal but downtrending.  I will have her follow-up with Almyra Free in the next couple of weeks for medication reconciliation and review of blood sugars.  Blood pressure at goal.  Continue current regimen  Continue statin.  Check CMP.  Not yet due for fasting lipid panel.  Check CBC given known thrombocytopenia.  I cannot find which she is had a work-up before but we may need to consider referral  New start of Cymbalta 60 mg daily for anxiety and depressive disorders.  This  should help with neuropathy as well in her legs and hopefully will alleviate some of the back pain she has been experiencing.  Uncertain if the heart palpitations are related to electrolyte deficiency versus manifestation of anxiety versus arrhythmia.  I offered EKG today but she declined this.  Her heart rate was normal and the rhythm was normal on exam.  I could not appreciate any extra beats.  I would like her to start seeing Scott in the office for counseling services.  She is amenable to this.  No orders of the defined types were placed in this encounter.  No orders of the defined types were placed in this encounter.    Janora Norlander, DO [place] [phone]

## 2021-06-12 NOTE — Patient Instructions (Signed)
Taking the medicine as directed and not missing any doses is one of the best things you can do to treat your depression.  Here are some things to keep in mind:  Side effects (stomach upset, some increased anxiety) may happen before you notice a benefit.  These side effects typically go away over time. Changes to your dose of medicine or a change in medication all together is sometimes necessary Most people need to be on medication at least 12 months Many people will notice an improvement within two weeks but the full effect of the medication can take up to 4-6 weeks Stopping the medication when you start feeling better often results in a return of symptoms Never discontinue your medication without contacting a health care professional first.  Some medications require gradual discontinuation/ taper and can make you sick if you stop them abruptly.  If your symptoms worsen or you have thoughts of suicide/homicide, PLEASE SEEK IMMEDIATE MEDICAL ATTENTION.  You may always call:  National Suicide Hotline: 800-273-8255 [place] Crisis Line: 336-832-9700 Crisis Recovery in [place]: 800-939-5911   These are available 24 hours a day, 7 days a week.  

## 2021-06-13 LAB — CMP14+EGFR
ALT: 12 IU/L (ref 0–32)
AST: 13 IU/L (ref 0–40)
Albumin/Globulin Ratio: 1.8 (ref 1.2–2.2)
Albumin: 4.4 g/dL (ref 3.8–4.8)
Alkaline Phosphatase: 103 IU/L (ref 44–121)
BUN/Creatinine Ratio: 9 — ABNORMAL LOW (ref 12–28)
BUN: 12 mg/dL (ref 8–27)
Bilirubin Total: 0.3 mg/dL (ref 0.0–1.2)
CO2: 23 mmol/L (ref 20–29)
Calcium: 9.5 mg/dL (ref 8.7–10.3)
Chloride: 97 mmol/L (ref 96–106)
Creatinine, Ser: 1.36 mg/dL — ABNORMAL HIGH (ref 0.57–1.00)
Globulin, Total: 2.4 g/dL (ref 1.5–4.5)
Glucose: 108 mg/dL — ABNORMAL HIGH (ref 65–99)
Potassium: 3.8 mmol/L (ref 3.5–5.2)
Sodium: 138 mmol/L (ref 134–144)
Total Protein: 6.8 g/dL (ref 6.0–8.5)
eGFR: 44 mL/min/{1.73_m2} — ABNORMAL LOW (ref >59)

## 2021-06-13 LAB — CBC WITH DIFFERENTIAL/PLATELET
Basophils Absolute: 0.1 10*3/uL (ref 0.0–0.2)
Basos: 1 %
EOS (ABSOLUTE): 0.5 10*3/uL — ABNORMAL HIGH (ref 0.0–0.4)
Eos: 5 %
Hematocrit: 43 % (ref 34.0–46.6)
Hemoglobin: 14.2 g/dL (ref 11.1–15.9)
Immature Grans (Abs): 0 10*3/uL (ref 0.0–0.1)
Immature Granulocytes: 0 %
Lymphocytes Absolute: 3.4 10*3/uL — ABNORMAL HIGH (ref 0.7–3.1)
Lymphs: 34 %
MCH: 30.4 pg (ref 26.6–33.0)
MCHC: 33 g/dL (ref 31.5–35.7)
MCV: 92 fL (ref 79–97)
Monocytes Absolute: 0.6 10*3/uL (ref 0.1–0.9)
Monocytes: 6 %
Neutrophils Absolute: 5.4 10*3/uL (ref 1.4–7.0)
Neutrophils: 54 %
Platelets: 100 10*3/uL — CL (ref 150–450)
RBC: 4.67 x10E6/uL (ref 3.77–5.28)
RDW: 15 % (ref 11.7–15.4)
WBC: 10 10*3/uL (ref 3.4–10.8)

## 2021-06-15 ENCOUNTER — Telehealth: Payer: Self-pay | Admitting: Family Medicine

## 2021-06-15 NOTE — Telephone Encounter (Signed)
Spoke with patient, she is not having any suicidal thoughts.  She was recently prescribed Duloxetine and has been afraid to start it because the pharmacist told her one of the side effects could be suicidal thoughts.  I encouraged the patient to go ahead and start the medication, explaining to her that it was a very good medicine and worked well for most people without causing this side effect.  I advised patient if she noticed any suicidal thoughts to stop the medication and call us immediately.  Also, patient has been taking 325 mg of aspirin daily.  Patient was instructed to start 81 mg instead.  Her Metformin she has been taking twice daily because taking both at the same time was causing stomach upset and Dr. Nadine Counts advised her to take twice daily instead.  This does not seem to cause any problems.  Her Lasix she is taking once daily because of edema in her feet and ankles.

## 2021-06-16 ENCOUNTER — Other Ambulatory Visit: Payer: Self-pay | Admitting: Family Medicine

## 2021-06-16 DIAGNOSIS — D696 Thrombocytopenia, unspecified: Secondary | ICD-10-CM

## 2021-06-17 LAB — MAGNESIUM: Magnesium: 1.7 mg/dL (ref 1.6–2.3)

## 2021-06-17 LAB — SPECIMEN STATUS REPORT

## 2021-06-23 ENCOUNTER — Ambulatory Visit (INDEPENDENT_AMBULATORY_CARE_PROVIDER_SITE_OTHER): Payer: HMO | Admitting: Pharmacist

## 2021-06-23 DIAGNOSIS — E1142 Type 2 diabetes mellitus with diabetic polyneuropathy: Secondary | ICD-10-CM | POA: Diagnosis not present

## 2021-06-23 NOTE — Patient Instructions (Signed)
Visit Information  PATIENT GOALS:  Goals Addressed               This Visit's Progress     Patient Stated     T2DM-PharmD goal (pt-stated)        Current Barriers:  Unable to independently afford treatment regimen Unable to achieve control of T2DM  Suboptimal therapeutic regimen for T2DM  Pharmacist Clinical Goal(s):  Over the next 90 days, patient will verbalize ability to afford treatment regimen achieve adherence to monitoring guidelines and medication adherence to achieve therapeutic efficacy achieve control of T2DM as evidenced by GOAL A1C through collaboration with PharmD and provider.    Interventions: 1:1 collaboration with [name], DO regarding development and update of comprehensive plan of care as evidenced by provider attestation and co-signature Inter-disciplinary care team collaboration (see longitudinal plan of care) Comprehensive medication review performed; medication list updated in electronic medical record  Diabetes: Uncontrolled; current treatment:BASAGLAR 45 units , METFORMIN, RYBELSUS 7MG   Current glucose readings: fasting glucose: <179, post prandial glucose: <200 Denies hypoglycemic/hyperglycemic symptoms Current meal patterns: Discussed meal planning options and Plate Method for healthy eating Avoid sugary drinks and desserts Incorporate balanced protein, non starchy veggies, 1 serving of carbohydrate with each meal Increase water intake Increase physical activity as able Current exercise: unable due to pain; difficulty getting around; uses cane to walk Educated on medications; lifestyle modifications Recommended increase rybelsus to 7mg  daily CONTINUE rybelsus--tolerating well-->MAY INCREASE TO 14MG  PENDING GI; PATIENT CURRENTLY HAVING GI ISSUES S/P CYMBALTA TRIAL Denies history of thyroid cancer/MTC Patient shipment of rybelsus 7mg  given to patient in clinic (4 month supply)--NEED TO REORDER IN October 2022 Discontinue  glimepiride-removed from medication list Patient does not want to try another med for neuropathy/mood due to side effects, will revisit in 3 weeks Financial-patient enrolled in [employer] patient assistance for Basaglar and novo nordisk PAP for Rybelsus  Patient Goals/Self-Care Activities Over the next 90 days, patient will:  - take medications as prescribed focus on medication adherence by REDUCING A1C TO GOAL <7% check glucose DAILY (FASTING), document, and provide at future appointments  Follow Up Plan: Telephone follow up appointment with care management team member scheduled for: 3 WEEKS         The patient verbalized understanding of instructions, educational materials, and care plan provided today and declined offer to receive copy of patient instructions, educational materials, and care plan.   Telephone follow up appointment with care management team member scheduled for: 3 weeks  , PharmD, BCPS Clinical Pharmacist, [facility]  II Phone [phone]

## 2021-06-23 NOTE — Progress Notes (Signed)
Chronic Care Management Pharmacy Note  06/23/2021 Name:  [name] MRN:  [id number] DOB:  04-16-1961  Summary: diabetes   Recommendations/Changes made from today's visit: Diabetes: Uncontrolled; current treatment:BASAGLAR 45 units , METFORMIN, RYBELSUS 7MG  Current glucose readings: fasting glucose: <179, post prandial glucose: <200 Denies hypoglycemic/hyperglycemic symptoms Current meal patterns: Discussed meal planning options and Plate Method for healthy eating Avoid sugary drinks and desserts Incorporate balanced protein, non starchy veggies, 1 serving of carbohydrate with each meal Increase water intake Increase physical activity as able Current exercise: unable due to pain; difficulty getting around; uses cane to walk Educated on medications; lifestyle modifications Recommended increase rybelsus to 28m daily CONTINUE rybelsus--tolerating well-->MAY INCREASE TO 14MG PENDING GI; PATIENT CURRENTLY HAVING GI ISSUES S/P CYMBALTA TRIAL Denies history of thyroid cancer/MTC Patient shipment of rybelsus 775mgiven to patient in clinic (4 month supply)--NEED TO REORDER IN October 2022 Discontinue glimepiride-removed from medication list Patient does not want to try another med for neuropathy/mood due to side effects, will revisit in 3 weeks Financial-patient enrolled in [employer] patient assistance for Basaglar and novo nordisk PAP for Rybelsus  Follow Up Plan: Telephone follow up appointment with care management team member scheduled for: 3 WEEKS  Subjective: Stacey GOLDINGs an 89.o. year old female who is a primary patient of GoJanora NorlanderDO.  The CCM team was consulted for assistance with disease management and care coordination needs.    Engaged with patient by telephone for follow up visit in response to provider referral for pharmacy case management and/or care coordination services.   Consent to Services:  The patient was given information about  Chronic Care Management services, agreed to services, and gave verbal consent prior to initiation of services.  Please see initial visit note for detailed documentation.   Patient Care Team: GoJanora NorlanderDO as PCP - General (Family Medicine) McSatira SarkMD as PCP - Cardiology (Cardiology) YaMarcial PacasMD as Consulting Physician (Neurology) GiLatanya MaudlinMD as Consulting Physician (Orthopedic Surgery) McSatira SarkMD as Consulting Physician (Cardiology) HuIlean ChinaRN as Registered Nurse PrLavera GuiseRPVirt[facility]Pharmacist)  Objective:  Lab Results  Component Value Date   CREATININE 1.36 (H) 06/12/2021   CREATININE 0.98 08/08/2020   CREATININE 1.16 (H) 06/19/2020    Lab Results  Component Value Date   HGBA1C 8.9 (H) 06/12/2021   Last diabetic Eye exam:  Lab Results  Component Value Date/Time   HMDIABEYEEXA Retinopathy (A) 12/25/2018 12:00 AM    Last diabetic Foot exam: No results found for: HMDIABFOOTEX      Component Value Date/Time   CHOL 91 (L) 08/08/2020 1035   TRIG 134 08/08/2020 1035   TRIG 211 (H) 12/30/2014 1537   HDL 36 (L) 08/08/2020 1035   HDL 48 12/30/2014 1537   CHOLHDL 2.5 08/08/2020 1035   CHOLHDL 4.5 04/09/2013 1135   VLDL 37 04/09/2013 1135   LDLCALC 31 08/08/2020 1035   LDLCALC 42 08/28/2014 1037    Hepatic Function Latest Ref Rng & Units 06/12/2021 08/08/2020 06/18/2020  Total Protein 6.0 - 8.5 g/dL 6.8 6.4 6.7  Albumin 3.8 - 4.8 g/dL 4.4 4.0 3.8  AST 0 - 40 IU/L _0 ALT 0 - 32 IU/L _1 Alk Phosphatase 44 - 121 IU/L 103 105 77  Total Bilirubin 0.0 - 1.2 mg/dL 0.3 0.2 0.4  Bilirubin, Direct 0.0 - 0.3 mg/dL - - -  Lab Results  Component Value Date/Time   TSH 5.31 (H) 06/05/2019 03:09 PM   TSH 8.526 (H) 04/09/2013 04:50 AM    CBC Latest Ref Rng & Units 06/12/2021 02/13/2021 11/05/2020  WBC 3.4 - 10.8 x10E3/uL 10.0 9.4 8.4  Hemoglobin 11.1 - 15.9 g/dL 14.2 12.9 13.6  Hematocrit 34.0 - 46.6 % 43.0 38.4 42.9   Platelets 150 - 450 x10E3/uL 100(LL) 101(L) 117(L)    No results found for: VD25OH  Clinical ASCVD: No  The ASCVD Risk score Mikey Bussing DC Jr., et al., 2013) failed to calculate for the following reasons:   The valid total cholesterol range is 130 to 320 mg/dL    Other: (CHADS2VASc if Afib, PHQ9 if depression, MMRC or CAT for COPD, ACT, DEXA)  Social History   Tobacco Use  Smoking Status Every Day   Packs/day: 1.00   Years: 47.00   Pack years: 47.00   Types: Cigarettes   Start date: 05/14/1972  Smokeless Tobacco Never   BP Readings from Last 3 Encounters:  06/12/21 131/76  02/13/21 (!) 89/53  11/05/20 121/69   Pulse Readings from Last 3 Encounters:  06/12/21 94  02/13/21 81  11/05/20 71   Wt Readings from Last 3 Encounters:  06/12/21 (!) 314 lb (142.4 kg)  02/13/21 (!) 323 lb (146.5 kg)  11/05/20 (!) 311 lb (141.1 kg)    Assessment: Review of patient past medical history, allergies, medications, health status, including review of consultants reports, laboratory and other test data, was performed as part of comprehensive evaluation and provision of chronic care management services.   SDOH:  (Social Determinants of Health) assessments and interventions performed:    CCM Care Plan  Allergies  Allergen Reactions   Duloxetine    Farxiga [Dapagliflozin]     RECURRENT YEAST INFECTIONS   Morphine And Related Nausea And Vomiting    Medications Reviewed Today     Reviewed by Lavera Guise, [facility] (Pharmacist) on 06/23/21 at 1149  Med List Status: <None>   Medication Order Taking? Sig Documenting Provider Last Dose Status Informant  acetaminophen (TYLENOL) 325 MG tablet [id number] No Take 975 mg by mouth every 8 (eight) hours as needed. [provider] Taking Active   albuterol (VENTOLIN HFA) 108 (90 Base) MCG/ACT inhaler [id number] No Inhale 2 puffs into the lungs every 6 (six) hours as needed for wheezing or shortness of breath (for rescue). Ronnie Doss M, DO  Taking Active   Alpha-Lipoic Acid 600 MG CAPS [id number] No Take 1 capsule (600 mg total) by mouth daily. For diabetic neuropathy Janora Norlander, DO Taking Active   aspirin EC 81 MG tablet [id number] No Take 1 tablet (81 mg total) by mouth daily. Verta Ellen., NP Taking Active   Blood Glucose Monitoring Suppl [facility] VERIO) w/Device Drucie Opitz [id number] No Use to test blood sugar twice daily as directed; DX: E11.69 Janora Norlander, DO Taking Active Self  diclofenac sodium (VOLTAREN) 1 % GEL [id number] No Apply 4 g topically 4 (four) times daily. Ronnie Doss M, DO Taking Active Self  DULoxetine (CYMBALTA) 60 MG capsule [id number]  Take 1 capsule (60 mg total) by mouth daily. Janora Norlander, DO  Active            Med Note Lavera Guise   Tue Jun 23, 2021 11:48 AM) NOT TAKING  furosemide (LASIX) 40 MG tablet [id number] No TAKE 1 TABLET BY MOUTH ONCE DAILY AS NEEDED FOR EDEMA OR  FLUID  Patient taking differently: Take 40  mg by mouth 2 (two) times daily.   Ronnie Doss M, DO Taking Active   gabapentin (NEURONTIN) 600 MG tablet [id number] No Take 1.5 tablets (900 mg total) by mouth 3 (three) times daily. Ronnie Doss M, DO Taking Active   glucose blood (ONETOUCH VERIO) test strip [id number] No Use to test blood sugar twice daily as directed; DX: E11.69 Janora Norlander, DO Taking Active Self  Insulin Glargine (BASAGLAR KWIKPEN) 100 UNIT/ML [id number] No Inject 41-50 Units into the skin at bedtime. Ronnie Doss M, DO Taking Active   metFORMIN (GLUCOPHAGE-XR) 500 MG 24 hr tablet [id number] No TAKE 2 TABLETS BY MOUTH ONCE DAILY WITH BREAKFAST Ronnie Doss M, DO Taking Active   metoprolol tartrate (LOPRESSOR) 25 MG tablet [id number] No Take 1 tablet by mouth twice daily Gottschalk, Ashly M, DO Taking Active   nitroGLYCERIN (NITROSTAT) 0.4 MG SL tablet [id number] No Place 1 tablet (0.4 mg total) under the tongue every 5 (five) minutes as needed for chest pain. Verta Ellen., NP Taking Active   omeprazole (PRILOSEC) 40 MG capsule [id number] No TAKE 1 CAPSULE BY MOUTH 30 MINUTES ONCE DAILY BEFORE MEAL(S) Janora Norlander, DO Taking Active   OneTouch Delica Lancets 34J MISC [id number] No Use to test blood sugar twice daily as directed; DX: E11.69 Janora Norlander, DO Taking Active Self  OneTouch Delica Lancets 79Y MISC [id number] No Check blood sugar BID and prn  E11.9 Ronnie Doss M, DO Taking Active Self  rosuvastatin (CRESTOR) 20 MG tablet [id number] No TAKE 1 TABLET BY MOUTH AT BEDTIME Ronnie Doss M, DO Taking Active   Semaglutide (RYBELSUS) 7 MG TABS [id number] No Take 7 mg by mouth daily. [provider] Taking Active   telmisartan (MICARDIS) 40 MG tablet [id number] No Take 1 tablet (40 mg total) by mouth daily. Janora Norlander, DO Taking Active             Patient Active Problem List   Diagnosis Date Noted   Unstable angina ([place]) 06/18/2020   Moderate nonproliferative diabetic retinopathy of both eyes without macular edema associated with type 2 diabetes mellitus ([place]) 01/05/2019   Hypertension associated with diabetes ([place]) 09/11/2015   Spinal stenosis, lumbar region, with neurogenic claudication 04/25/2015   Arthritis associated with diabetes ([place]) 08/28/2014   Peripheral edema 08/28/2014   Diabetic neuropathy ([place]) 02/18/2014   External bleeding hemorrhoids 04/10/2013   Morbid (severe) obesity due to excess calories ([place]) 04/09/2013   Diabetes ([place]) 04/09/2013   Thrombocytopenia, unspecified ([place]) 04/09/2013   Chest pain 04/09/2013   OSA (obstructive sleep apnea) 07/06/2011   DOE (dyspnea on exertion) 06/06/2011   Coronary artery disease 02/03/2011   GERD (gastroesophageal reflux disease) 02/03/2011   Hyperlipidemia associated with type 2 diabetes mellitus ([place]) 02/03/2011   Noncompliance 02/03/2011   Generalized anxiety disorder 02/03/2011   Insomnia 02/03/2011   Cigarette smoker 02/03/2011    Immunization  History  Administered Date(s) Administered   Influenza,inj,Quad PF,6+ Mos 08/10/2013, 08/28/2014, 09/25/2015, 11/23/2017, 11/27/2018   Pneumococcal Conjugate-13 11/27/2018    Conditions to be addressed/monitored: DMII  Care Plan : PHARMD-MEDICATION MANAGEMENT  Updates made by Lavera Guise, [place] since 06/23/2021 12:00 AM     Problem: DISEASE PROGRESSION PREVENTION      Long-Range Goal: T2DM   This Visit's Progress: Not on track  Priority: High  Note:   Current Barriers:  Unable to independently afford treatment regimen Unable to achieve control of T2DM  Suboptimal therapeutic regimen for T2DM  Pharmacist Clinical Goal(s):  Over the next 90 days, patient will verbalize ability to afford treatment regimen achieve adherence to monitoring guidelines and medication adherence to achieve therapeutic efficacy achieve control of T2DM as evidenced by GOAL A1C  through collaboration with PharmD and provider.    Interventions: 1:1 collaboration with Janora Norlander, DO regarding development and update of comprehensive plan of care as evidenced by provider attestation and co-signature Inter-disciplinary care team collaboration (see longitudinal plan of care) Comprehensive medication review performed; medication list updated in electronic medical record  Diabetes: Uncontrolled; current treatment:BASAGLAR 45 units , METFORMIN, RYBELSUS 7MG  Current glucose readings: fasting glucose: <179, post prandial glucose: <200 Denies hypoglycemic/hyperglycemic symptoms Current meal patterns: Discussed meal planning options and Plate Method for healthy eating Avoid sugary drinks and desserts Incorporate balanced protein, non starchy veggies, 1 serving of carbohydrate with each meal Increase water intake Increase physical activity as able Current exercise: unable due to pain; difficulty getting around; uses cane to walk Educated on medications; lifestyle modifications Recommended increase  rybelsus to 80m daily CONTINUE rybelsus--tolerating well-->MAY INCREASE TO 14MG PENDING GI; PATIENT CURRENTLY HAVING GI ISSUES S/P CYMBALTA TRIAL Denies history of thyroid cancer/MTC Patient shipment of rybelsus 767mgiven to patient in clinic (4 month supply)--NEED TO REORDER IN October 2022 Discontinue glimepiride-removed from medication list Patient does not want to try another med for neuropathy/mood due to side effects, will revisit in 3 weeks Financial-patient enrolled in [employer] patient assistance for Basaglar and novo nordisk PAP for Rybelsus  Patient Goals/Self-Care Activities Over the next 90 days, patient will:  - take medications as prescribed focus on medication adherence by REDUCING A1C TO GOAL <7% check glucose DAILY (FASTING), document, and provide at future appointments  Follow Up Plan: Telephone follow up appointment with care management team member scheduled for: 3 WEEKS      Medication Assistance:  [place]aglar obtained through [place] AND rybelsus obtained through novo nordisk medication assistance program.  Enrollment ends 11/07/21  Patient's preferred pharmacy is:  [address] [id number]: [phone]: [phone]  ([place]one: [phone]: [phone] - [id number]hone: [phone]: [phone] Follow Up:  Patient agrees to Care Plan and Follow-up.  Plan: Telephone follow up appointment with care management team member scheduled for:  3 weeks   JuRegina EckPharmD, BCPS Clinical Pharmacist, We[place]II Phone [phone]

## 2021-06-26 ENCOUNTER — Other Ambulatory Visit: Payer: Self-pay | Admitting: Family Medicine

## 2021-06-26 DIAGNOSIS — E785 Hyperlipidemia, unspecified: Secondary | ICD-10-CM

## 2021-06-26 DIAGNOSIS — E1159 Type 2 diabetes mellitus with other circulatory complications: Secondary | ICD-10-CM

## 2021-06-26 DIAGNOSIS — E1169 Type 2 diabetes mellitus with other specified complication: Secondary | ICD-10-CM

## 2021-06-30 ENCOUNTER — Inpatient Hospital Stay (HOSPITAL_COMMUNITY): Payer: HMO | Attending: Hematology and Oncology | Admitting: Hematology and Oncology

## 2021-06-30 NOTE — Telephone Encounter (Signed)
See pharmd notes

## 2021-07-09 ENCOUNTER — Ambulatory Visit (INDEPENDENT_AMBULATORY_CARE_PROVIDER_SITE_OTHER): Payer: HMO | Admitting: Pharmacist

## 2021-07-09 DIAGNOSIS — E1142 Type 2 diabetes mellitus with diabetic polyneuropathy: Secondary | ICD-10-CM

## 2021-07-09 NOTE — Progress Notes (Signed)
Chronic Care Management Pharmacy Note  07/09/2021 Name:  [name] MRN:  [id number] DOB:  04/26/1961  Summary: T2DM  Recommendations/Changes made from today's visit: Diabetes: Uncontrolled; current treatment:BASAGLAR 45 units , METFORMIN, RYBELSUS 7MG  Current glucose readings: fasting glucose: <160, post prandial glucose: <200 Denies hypoglycemic/hyperglycemic symptoms Current meal patterns: Discussed meal planning options and Plate Method for healthy eating Avoid sugary drinks and desserts Incorporate balanced protein, non starchy veggies, 1 serving of carbohydrate with each meal Increase water intake Increase physical activity as able Current exercise: unable due to pain; difficulty getting around; uses cane to walk Educated on medications; lifestyle modifications Recommended increase rybelsus to 39m daily CONTINUE rybelsus--tolerating well-->MAY INCREASE TO 14MG PENDING GI Denies history of thyroid cancer/MTC Patient shipment of rybelsus 750mgiven to patient in clinic (4 month supply)--NEED TO REORDER IN October 2022 Discontinue glimepiride-removed from medication list Patient does not want to try another med for neuropathy/mood due to side effects, will continue to revisit  Financial-[place] enrolled in [employer] patient assistance for Basaglar and novo nordisk PAP for Rybelsus Follow Up Plan: Telephone follow up appointment with care management team member scheduled for: 2 months  Subjective: BeAZAELA CARACCIs an [age].o. year old female who is a primary patient of GoJanora NorlanderDO.  The CCM team was consulted for assistance with disease management and care coordination needs.    Engaged with patient by telephone for follow up visit in response to provider referral for pharmacy case management and/or care coordination services.   Consent to Services:  The patient was given information about Chronic Care Management services, agreed to services, and gave  verbal consent prior to initiation of services.  Please see initial visit note for detailed documentation.   Patient Care Team: GoJanora NorlanderDO as PCP - General (Family Medicine) McSatira SarkMD as PCP - Cardiology (Cardiology) YaMarcial PacasMD as Consulting Physician (Neurology) GiLatanya MaudlinMD as Consulting Physician (Orthopedic Surgery) McSatira SarkMD as Consulting Physician (Cardiology) HuIlean ChinaRN as Registered Nurse PrLavera GuiseRPStrategic Behavioral Center CharlottePharmacist)   Objective:  Lab Results  Component Value Date   CREATININE 1.36 (H) 06/12/2021   CREATININE 0.98 08/08/2020   CREATININE 1.16 (H) 06/19/2020    Lab Results  Component Value Date   HGBA1C 8.9 (H) 06/12/2021   Last diabetic Eye exam:  Lab Results  Component Value Date/Time   HMDIABEYEEXA Retinopathy (A) 12/25/2018 12:00 AM    Last diabetic Foot exam: No results found for: HMDIABFOOTEX      Component Value Date/Time   CHOL 91 (L) 08/08/2020 1035   TRIG 134 08/08/2020 1035   TRIG 211 (H) 12/30/2014 1537   HDL 36 (L) 08/08/2020 1035   HDL 48 12/30/2014 1537   CHOLHDL 2.5 08/08/2020 1035   CHOLHDL 4.5 04/09/2013 1135   VLDL 37 04/09/2013 1135   LDLCALC 31 08/08/2020 1035   LDLCALC 42 08/28/2014 1037    Hepatic Function Latest Ref Rng & Units 06/12/2021 08/08/2020 06/18/2020  Total Protein 6.0 - 8.5 g/dL 6.8 6.4 6.7  Albumin 3.8 - 4.8 g/dL 4.4 4.0 3.8  AST 0 - 40 IU/L _0 ALT 0 - 32 IU/L _1 Alk Phosphatase 44 - 121 IU/L 103 105 77  Total Bilirubin 0.0 - 1.2 mg/dL 0.3 0.2 0.4  Bilirubin, Direct 0.0 - 0.3 mg/dL - - -    Lab Results  Component Value Date/Time   TSH  5.31 (H) 06/05/2019 03:09 PM   TSH 8.526 (H) 04/09/2013 04:50 AM    CBC Latest Ref Rng & Units 06/12/2021 02/13/2021 11/05/2020  WBC 3.4 - 10.8 x10E3/uL 10.0 9.4 8.4  Hemoglobin 11.1 - 15.9 g/dL 14.2 12.9 13.6  Hematocrit 34.0 - 46.6 % 43.0 38.4 42.9  Platelets 150 - 450 x10E3/uL 100(LL) 101(L) 117(L)    No  results found for: VD25OH  Clinical ASCVD: No  The ASCVD Risk score Mikey Bussing DC Jr., et al., 2013) failed to calculate for the following reasons:   The valid total cholesterol range is 130 to 320 mg/dL    Other: (CHADS2VASc if Afib, PHQ9 if depression, MMRC or CAT for COPD, ACT, DEXA)  Social History   Tobacco Use  Smoking Status Every Day   Packs/day: 1.00   Years: 47.00   Pack years: 47.00   Types: Cigarettes   Start date: 05/14/1972  Smokeless Tobacco Never   BP Readings from Last 3 Encounters:  06/12/21 131/76  02/13/21 (!) 89/53  11/05/20 121/69   Pulse Readings from Last 3 Encounters:  06/12/21 94  02/13/21 81  11/05/20 71   Wt Readings from Last 3 Encounters:  06/12/21 (!) 314 lb (142.4 kg)  02/13/21 (!) 323 lb (146.5 kg)  11/05/20 (!) 311 lb (141.1 kg)    Assessment: Review of patient past medical history, allergies, medications, health status, including review of consultants reports, laboratory and other test data, was performed as part of comprehensive evaluation and provision of chronic care management services.   SDOH:  (Social Determinants of Health) assessments and interventions performed:    CCM Care Plan  Allergies  Allergen Reactions   Duloxetine    Farxiga [Dapagliflozin]     RECURRENT YEAST INFECTIONS   Morphine And Related Nausea And Vomiting    Medications Reviewed Today     Reviewed by Lavera Guise, [facility] (Pharmacist) on 07/09/21 at 1143  Med List Status: <None>   Medication Order Taking? Sig Documenting Provider Last Dose Status Informant  acetaminophen (TYLENOL) 325 MG tablet [id number] No Take 975 mg by mouth every 8 (eight) hours as needed. [provider] Taking Active   albuterol (VENTOLIN HFA) 108 (90 Base) MCG/ACT inhaler [id number] No Inhale 2 puffs into the lungs every 6 (six) hours as needed for wheezing or shortness of breath (for rescue). Ronnie Doss M, DO Taking Active   Alpha-Lipoic Acid 600 MG CAPS [id number] No  Take 1 capsule (600 mg total) by mouth daily. For diabetic neuropathy Janora Norlander, DO Taking Active   aspirin EC 81 MG tablet [id number] No Take 1 tablet (81 mg total) by mouth daily. Verta Ellen., NP Taking Active   Blood Glucose Monitoring Suppl [facility] VERIO) w/Device Drucie Opitz [id number] No Use to test blood sugar twice daily as directed; DX: E11.69 Janora Norlander, DO Taking Active Self  diclofenac sodium (VOLTAREN) 1 % GEL [id number] No Apply 4 g topically 4 (four) times daily. Ronnie Doss M, DO Taking Active Self  DULoxetine (CYMBALTA) 60 MG capsule [id number]  Take 1 capsule (60 mg total) by mouth daily. Janora Norlander, DO  Active            Med Note Lavera Guise   Tue Jun 23, 2021 11:48 AM) NOT TAKING  furosemide (LASIX) 40 MG tablet [id number]  TAKE 1 TABLET BY MOUTH ONCE DAILY AS NEEDED FOR EDEMA OR FLUID Ronnie Doss M, DO  Active   gabapentin (NEURONTIN) 600 MG tablet [id number] No Take  1.5 tablets (900 mg total) by mouth 3 (three) times daily. Ronnie Doss M, DO Taking Active   glucose blood (ONETOUCH VERIO) test strip [id number] No Use to test blood sugar twice daily as directed; DX: E11.69 Janora Norlander, DO Taking Active Self  Insulin Glargine (BASAGLAR KWIKPEN) 100 UNIT/ML [id number] No Inject 41-50 Units into the skin at bedtime. Janora Norlander, DO Taking Active            Med Note Lavera Guise   Tue Jun 23, 2021 11:49 AM) 45 UNITS  metFORMIN (GLUCOPHAGE-XR) 500 MG 24 hr tablet [id number] No TAKE 2 TABLETS BY MOUTH ONCE DAILY WITH BREAKFAST Ronnie Doss M, DO Taking Active   metoprolol tartrate (LOPRESSOR) 25 MG tablet [id number] No Take 1 tablet by mouth twice daily Gottschalk, Ashly M, DO Taking Active   nitroGLYCERIN (NITROSTAT) 0.4 MG SL tablet [id number] No Place 1 tablet (0.4 mg total) under the tongue every 5 (five) minutes as needed for chest pain. Verta Ellen., NP Taking Active   omeprazole (PRILOSEC) 40 MG capsule  [id number] No TAKE 1 CAPSULE BY MOUTH 30 MINUTES ONCE DAILY BEFORE MEAL(S) Janora Norlander, DO Taking Active   OneTouch Delica Lancets 21J MISC [id number] No Use to test blood sugar twice daily as directed; DX: E11.69 Janora Norlander, DO Taking Active Self  OneTouch Delica Lancets 33K MISC [id number] No Check blood sugar BID and prn  E11.9 Ronnie Doss M, DO Taking Active Self  rosuvastatin (CRESTOR) 20 MG tablet [id number]  TAKE 1 TABLET BY MOUTH AT BEDTIME Ronnie Doss M, DO  Active   Semaglutide (RYBELSUS) 7 MG TABS [id number] No Take 7 mg by mouth daily. [provider] Taking Active   telmisartan (MICARDIS) 40 MG tablet [id number] No Take 1 tablet (40 mg total) by mouth daily. Janora Norlander, DO Taking Active             Patient Active Problem List   Diagnosis Date Noted   Unstable angina ([place]) 06/18/2020   Moderate nonproliferative diabetic retinopathy of both eyes without macular edema associated with type 2 diabetes mellitus ([place]) 01/05/2019   Hypertension associated with diabetes ([place]) 09/11/2015   Spinal stenosis, lumbar region, with neurogenic claudication 04/25/2015   Arthritis associated with diabetes ([place]) 08/28/2014   Peripheral edema 08/28/2014   Diabetic neuropathy ([place]) 02/18/2014   External bleeding hemorrhoids 04/10/2013   Morbid (severe) obesity due to excess calories ([place]) 04/09/2013   Diabetes ([place]) 04/09/2013   Thrombocytopenia, unspecified ([place]) 04/09/2013   Chest pain 04/09/2013   OSA (obstructive sleep apnea) 07/06/2011   DOE (dyspnea on exertion) 06/06/2011   Coronary artery disease 02/03/2011   GERD (gastroesophageal reflux disease) 02/03/2011   Hyperlipidemia associated with type 2 diabetes mellitus ([place]) 02/03/2011   Noncompliance 02/03/2011   Generalized anxiety disorder 02/03/2011   Insomnia 02/03/2011   Cigarette smoker 02/03/2011    Immunization History  Administered Date(s) Administered   Influenza,inj,Quad PF,6+  Mos 08/10/2013, 08/28/2014, 09/25/2015, 11/23/2017, 11/27/2018   Pneumococcal Conjugate-13 11/27/2018    Conditions to be addressed/monitored: DMII  Care Plan : PHARMD-MEDICATION MANAGEMENT  Updates made by Lavera Guise, [place] since 07/09/2021 12:00 AM     Problem: DISEASE PROGRESSION PREVENTION      Long-Range Goal: T2DM   Recent Progress: Not on track  Priority: High  Note:   Current Barriers:  Unable to independently afford treatment regimen Unable to achieve control of T2DM  Suboptimal therapeutic regimen for T2DM  Pharmacist Clinical Goal(s):  Over the next 90 days, patient will verbalize ability to afford treatment regimen achieve adherence to monitoring guidelines and medication adherence to achieve therapeutic efficacy achieve control of T2DM as evidenced by GOAL A1C  through collaboration with PharmD and provider.    Interventions: 1:1 collaboration with Janora Norlander, DO regarding development and update of comprehensive plan of care as evidenced by provider attestation and co-signature Inter-disciplinary care team collaboration (see longitudinal plan of care) Comprehensive medication review performed; medication list updated in electronic medical record  Diabetes: Uncontrolled; current treatment:BASAGLAR 45 units , METFORMIN, RYBELSUS 7MG  Current glucose readings: fasting glucose: <160, post prandial glucose: <200 Denies hypoglycemic/hyperglycemic symptoms Current meal patterns: Discussed meal planning options and Plate Method for healthy eating Avoid sugary drinks and desserts Incorporate balanced protein, non starchy veggies, 1 serving of carbohydrate with each meal Increase water intake Increase physical activity as able Current exercise: unable due to pain; difficulty getting around; uses cane to walk Educated on medications; lifestyle modifications Recommended increase rybelsus to 26m daily CONTINUE rybelsus--tolerating well-->MAY INCREASE TO 14MG  PENDING GI Denies history of thyroid cancer/MTC Patient shipment of rybelsus 776mgiven to patient in clinic (4 month supply)--NEED TO REORDER IN October 2022 Discontinue glimepiride-removed from medication list Patient does not want to try another med for neuropathy/mood due to side effects, will continue to revisit  Financial-patient enrolled in [employer] patient assistance for Basaglar and novo nordisk PAP for Rybelsus  Patient Goals/Self-Care Activities Over the next 90 days, patient will:  - take medications as prescribed focus on medication adherence by REDUCING A1C TO GOAL <7% check glucose DAILY (FASTING), document, and provide at future appointments  Follow Up Plan: Telephone follow up appointment with care management team member scheduled for: 2 months      Medication Assistance:  RYBELSUS obtained through [employer] assistance program.  Enrollment ends 11/07/21  Patient's preferred pharmacy is:  [address] [address]hone: [phone]: [phone]  (OpMcHenry- CaPine BendCANorth CatasauquaoCon[facility]hone: [phone]: [phone] - [id number]hone: [phone]: [phone] Follow Up:  Patient agrees to Care Plan and Follow-up.  Plan: Telephone follow up appointment with care management team member scheduled for:  2 MONTHS    JuRegina EckPharmD, BCPS Clinical Pharmacist, WeVan [place]orneII Phone [phone]

## 2021-07-09 NOTE — Patient Instructions (Signed)
Visit Information  PATIENT GOALS:  Goals Addressed               This Visit's Progress     Patient Stated     T2DM-PharmD goal (pt-stated)        Current Barriers:  Unable to independently afford treatment regimen Unable to achieve control of T2DM  Suboptimal therapeutic regimen for T2DM  Pharmacist Clinical Goal(s):  Over the next 90 days, patient will verbalize ability to afford treatment regimen achieve adherence to monitoring guidelines and medication adherence to achieve therapeutic efficacy achieve control of T2DM as evidenced by GOAL A1C through collaboration with PharmD and provider.    Interventions: 1:1 collaboration with [name], DO regarding development and update of comprehensive plan of care as evidenced by provider attestation and co-signature Inter-disciplinary care team collaboration (see longitudinal plan of care) Comprehensive medication review performed; medication list updated in electronic medical record  Diabetes: Uncontrolled; current treatment:BASAGLAR 45 units , METFORMIN, RYBELSUS 7MG   Current glucose readings: fasting glucose: <160, post prandial glucose: <200 Denies hypoglycemic/hyperglycemic symptoms Current meal patterns: Discussed meal planning options and Plate Method for healthy eating Avoid sugary drinks and desserts Incorporate balanced protein, non starchy veggies, 1 serving of carbohydrate with each meal Increase water intake Increase physical activity as able Current exercise: unable due to pain; difficulty getting around; uses cane to walk Educated on medications; lifestyle modifications Recommended increase rybelsus to 7mg  daily CONTINUE rybelsus--tolerating well-->MAY INCREASE TO 14MG  PENDING GI Denies history of thyroid cancer/MTC Patient shipment of rybelsus 7mg  given to patient in clinic (4 month supply)--NEED TO REORDER IN October 2022 Discontinue glimepiride-removed from medication list Patient does not want to  try another med for neuropathy/mood due to side effects, will continue to revisit  Financial-patient enrolled in [employer] patient assistance for Basaglar and novo nordisk PAP for Rybelsus  Patient Goals/Self-Care Activities Over the next 90 days, patient will:  - take medications as prescribed focus on medication adherence by REDUCING A1C TO GOAL <7% check glucose DAILY (FASTING), document, and provide at future appointments  Follow Up Plan: Telephone follow up appointment with care management team member scheduled for: 2 months         The patient verbalized understanding of instructions, educational materials, and care plan provided today and declined offer to receive copy of patient instructions, educational materials, and care plan.   Telephone follow up appointment with care management team member scheduled for: 2 MONTHS   , PharmD, BCPS Clinical Pharmacist, [facility]  II Phone [phone]

## 2021-08-19 ENCOUNTER — Other Ambulatory Visit: Payer: Self-pay | Admitting: Family Medicine

## 2021-08-19 DIAGNOSIS — K219 Gastro-esophageal reflux disease without esophagitis: Secondary | ICD-10-CM

## 2021-09-08 ENCOUNTER — Ambulatory Visit (INDEPENDENT_AMBULATORY_CARE_PROVIDER_SITE_OTHER): Payer: HMO | Admitting: Pharmacist

## 2021-09-08 ENCOUNTER — Other Ambulatory Visit: Payer: Self-pay | Admitting: Family Medicine

## 2021-09-08 ENCOUNTER — Telehealth: Payer: Self-pay | Admitting: Pharmacist

## 2021-09-08 DIAGNOSIS — E1159 Type 2 diabetes mellitus with other circulatory complications: Secondary | ICD-10-CM

## 2021-09-08 DIAGNOSIS — E0843 Diabetes mellitus due to underlying condition with diabetic autonomic (poly)neuropathy: Secondary | ICD-10-CM

## 2021-09-08 DIAGNOSIS — M48062 Spinal stenosis, lumbar region with neurogenic claudication: Secondary | ICD-10-CM

## 2021-09-08 DIAGNOSIS — E1165 Type 2 diabetes mellitus with hyperglycemia: Secondary | ICD-10-CM

## 2021-09-08 MED ORDER — BASAGLAR KWIKPEN 100 UNIT/ML ~~LOC~~ SOPN: 50.0000 [IU] | PEN_INJECTOR | Freq: Every day | SUBCUTANEOUS | 12 refills | Status: DC

## 2021-09-08 NOTE — Telephone Encounter (Signed)
Pt states she is ok to go through [facility] pain management. States she prefers [facility] are is there is one

## 2021-09-08 NOTE — Telephone Encounter (Signed)
While completing visit for diabetes, patient asked about prescribing something for continued/chronic leg pain. She is on gabapentin, cymbalta? (I don't think she is taking consistently only filled once on 06/12/21) ) & tylenol with no relief.  Worried about upcoming cold weather.  Please advise patient--likely needs to be seen vs referral?  Just passing along request! Raynelle Fanning

## 2021-09-08 NOTE — Telephone Encounter (Signed)
Glad to refer to pain management should she desire

## 2021-09-08 NOTE — Progress Notes (Signed)
Referred to pain managemet

## 2021-09-08 NOTE — Progress Notes (Signed)
Chronic Care Management Pharmacy Note  09/08/2021 Name:  [name] MRN:  [id number] DOB:  01-Aug-1961  Summary: T2DM , HLD  Recommendations/Changes made from today's visit: Diabetes: Uncontrolled; current treatment:BASAGLAR 45-50 units , METFORMIN, RYBELSUS 7MG  Current glucose readings: fasting glucose: <160, post prandial glucose: <200 Denies hypoglycemic/hyperglycemic symptoms Current meal patterns: Discussed meal planning options and Plate Method for healthy eating Avoid sugary drinks and desserts Incorporate balanced protein, non starchy veggies, 1 serving of carbohydrate with each meal Increase water intake Increase physical activity as able Current exercise: unable due to pain; difficulty getting around; uses cane to walk Educated on medications; lifestyle modifications Recommended increase rybelsus to 53m daily CONTINUE rybelsus--tolerating well-->MAY INCREASE TO 14MG PENDING GI Denies history of thyroid cancer/MTC Patient shipment of rybelsus 736mgiven to patient in clinic (4 month supply)--AUTOMATIC REFILLS ENGAGED  Patient does not want to try another med for neuropathy/mood due to side effects, will continue to revisit  Financial-patient enrolled in [employer] patient assistance for Basaglar and novo nordisk PAP for Rybelsus  Hypertension -patient reports BP control is better (briefly discussed), compliant with medicine  Hyperlipidemia -LDL <70 at goal, continue current medication (crestor)  Patient Goals/Self-Care Activities Over the next 90 days, patient will:  - take medications as prescribed focus on medication adherence by REDUCING A1C TO GOAL <7% check glucose DAILY (FASTING), document, and provide at future appointments  Follow Up Plan: Telephone follow up appointment with care management team member scheduled for: 2 months  Subjective: BeABAIGEAL MOOMAWs an [age].o. year old female who is a primary patient of GoJanora NorlanderDO.  The  CCM team was consulted for assistance with disease management and care coordination needs.    Engaged with patient by telephone for follow up visit in response to provider referral for pharmacy case management and/or care coordination services.   Consent to Services:  The patient was given information about Chronic Care Management services, agreed to services, and gave verbal consent prior to initiation of services.  Please see initial visit note for detailed documentation.   Patient Care Team: GoJanora NorlanderDO as PCP - General (Family Medicine) McSatira SarkMD as PCP - Cardiology (Cardiology) YaMarcial PacasMD as Consulting Physician (Neurology) GiLatanya MaudlinMD as Consulting Physician (Orthopedic Surgery) McSatira SarkMD as Consulting Physician (Cardiology) HuIlean ChinaRN as Registered Nurse PrLavera GuiseRPSt Lukes [facility] BuxmontPharmacist)  Objective:  Lab Results  Component Value Date   CREATININE 1.36 (H) 06/12/2021   CREATININE 0.98 08/08/2020   CREATININE 1.16 (H) 06/19/2020    Lab Results  Component Value Date   HGBA1C 8.9 (H) 06/12/2021   Last diabetic Eye exam:  Lab Results  Component Value Date/Time   HMDIABEYEEXA Retinopathy (A) 12/25/2018 12:00 AM    Last diabetic Foot exam: No results found for: HMDIABFOOTEX      Component Value Date/Time   CHOL 91 (L) 08/08/2020 1035   TRIG 134 08/08/2020 1035   TRIG 211 (H) 12/30/2014 1537   HDL 36 (L) 08/08/2020 1035   HDL 48 12/30/2014 1537   CHOLHDL 2.5 08/08/2020 1035   CHOLHDL 4.5 04/09/2013 1135   VLDL 37 04/09/2013 1135   LDLCALC 31 08/08/2020 1035   LDLCALC 42 08/28/2014 1037    Hepatic Function Latest Ref Rng & Units 06/12/2021 08/08/2020 06/18/2020  Total Protein 6.0 - 8.5 g/dL 6.8 6.4 6.7  Albumin 3.8 - 4.8 g/dL 4.4 4.0 3.8  AST 0 - 40  IU/L '13 13 17  ' ALT 0 - 32 IU/L '12 11 16  ' Alk Phosphatase 44 - 121 IU/L 103 105 77  Total Bilirubin 0.0 - 1.2 mg/dL 0.3 0.2 0.4  Bilirubin, Direct 0.0 - 0.3 mg/dL  - - -    Lab Results  Component Value Date/Time   TSH 5.31 (H) 06/05/2019 03:09 PM   TSH 8.526 (H) 04/09/2013 04:50 AM    CBC Latest Ref Rng & Units 06/12/2021 02/13/2021 11/05/2020  WBC 3.4 - 10.8 x10E3/uL 10.0 9.4 8.4  Hemoglobin 11.1 - 15.9 g/dL 14.2 12.9 13.6  Hematocrit 34.0 - 46.6 % 43.0 38.4 42.9  Platelets 150 - 450 x10E3/uL 100(LL) 101(L) 117(L)    No results found for: VD25OH  Clinical ASCVD: No  The ASCVD Risk score (Arnett DK, et al., 2019) failed to calculate for the following reasons:   The valid total cholesterol range is 130 to 320 mg/dL    Other: (CHADS2VASc if Afib, PHQ9 if depression, MMRC or CAT for COPD, ACT, DEXA)  Social History   Tobacco Use  Smoking Status Every Day   Packs/day: 1.00   Years: 47.00   Pack years: 47.00   Types: Cigarettes   Start date: 05/14/1972  Smokeless Tobacco Never   BP Readings from Last 3 Encounters:  06/12/21 131/76  02/13/21 (!) 89/53  11/05/20 121/69   Pulse Readings from Last 3 Encounters:  06/12/21 94  02/13/21 81  11/05/20 71   Wt Readings from Last 3 Encounters:  06/12/21 (!) 314 lb (142.4 kg)  02/13/21 (!) 323 lb (146.5 kg)  11/05/20 (!) 311 lb (141.1 kg)    Assessment: Review of patient past medical history, allergies, medications, health status, including review of consultants reports, laboratory and other test data, was performed as part of comprehensive evaluation and provision of chronic care management services.   SDOH:  (Social Determinants of Health) assessments and interventions performed:    CCM Care Plan  Allergies  Allergen Reactions   Duloxetine    Farxiga [Dapagliflozin]     RECURRENT YEAST INFECTIONS   Morphine And Related Nausea And Vomiting    Medications Reviewed Today     Reviewed by Lavera Guise, [facility] (Pharmacist) on 09/22/21 at 1508  Med List Status: <None>   Medication Order Taking? Sig Documenting Provider Last Dose Status Informant  acetaminophen (TYLENOL) 325 MG tablet  [id number] No Take 975 mg by mouth every 8 (eight) hours as needed. [provider] Taking Active   albuterol (VENTOLIN HFA) 108 (90 Base) MCG/ACT inhaler [id number] No Inhale 2 puffs into the lungs every 6 (six) hours as needed for wheezing or shortness of breath (for rescue). Ronnie Doss M, DO Taking Active   Alpha-Lipoic Acid 600 MG CAPS [id number] No Take 1 capsule (600 mg total) by mouth daily. For diabetic neuropathy Janora Norlander, DO Taking Active   aspirin EC 81 MG tablet [id number] No Take 1 tablet (81 mg total) by mouth daily. Verta Ellen., NP Taking Active   Blood Glucose Monitoring Suppl [facility] VERIO) w/Device Drucie Opitz [id number] No Use to test blood sugar twice daily as directed; DX: E11.69 Janora Norlander, DO Taking Active Self  diclofenac sodium (VOLTAREN) 1 % GEL [id number] No Apply 4 g topically 4 (four) times daily. Ronnie Doss M, DO Taking Active Self  DULoxetine (CYMBALTA) 60 MG capsule [id number]  Take 1 capsule (60 mg total) by mouth daily. Janora Norlander, DO  Active  Med Note Lavera Guise   Tue Jun 23, 2021 11:48 AM) NOT TAKING  furosemide (LASIX) 40 MG tablet [id number]  TAKE 1 TABLET BY MOUTH ONCE DAILY AS NEEDED FOR EDEMA OR FLUID Ronnie Doss M, DO  Active   gabapentin (NEURONTIN) 600 MG tablet [id number] No Take 1.5 tablets (900 mg total) by mouth 3 (three) times daily. Ronnie Doss M, DO Taking Active   glucose blood (ONETOUCH VERIO) test strip [id number] No Use to test blood sugar twice daily as directed; DX: E11.69 Janora Norlander, DO Taking Active Self  Insulin Glargine (BASAGLAR KWIKPEN) 100 UNIT/ML [id number]  Inject 50 Units into the skin at bedtime. Ronnie Doss M, DO  Active   metFORMIN (GLUCOPHAGE-XR) 500 MG 24 hr tablet [id number] No TAKE 2 TABLETS BY MOUTH ONCE DAILY WITH BREAKFAST Ronnie Doss M, DO Taking Active   metoprolol tartrate (LOPRESSOR) 25 MG tablet [id number] No Take 1 tablet by mouth  twice daily Gottschalk, Ashly M, DO Taking Active   nitroGLYCERIN (NITROSTAT) 0.4 MG SL tablet [id number] No Place 1 tablet (0.4 mg total) under the tongue every 5 (five) minutes as needed for chest pain. Verta Ellen., NP Taking Active   omeprazole (PRILOSEC) 40 MG capsule [id number]  TAKE 1 CAPSULE BY MOUTH ONCE DAILY 30 MINUTES BEFORE MEAL(S) Ronnie Doss M, DO  Active   OneTouch Delica Lancets 76B MISC [id number] No Use to test blood sugar twice daily as directed; DX: E11.69 Janora Norlander, DO Taking Active Self  OneTouch Delica Lancets 40X MISC [id number] No Check blood sugar BID and prn  E11.9 Ronnie Doss M, DO Taking Active Self  rosuvastatin (CRESTOR) 20 MG tablet [id number]  TAKE 1 TABLET BY MOUTH AT BEDTIME Ronnie Doss M, DO  Active   Semaglutide (RYBELSUS) 7 MG TABS [id number] No Take 7 mg by mouth daily. [provider] Taking Active            Med Note [name], Royce Macadamia   Tue Sep 22, 2021  3:08 PM) VIA NOVO NORDISK PATIENT ASSISTANCE PROGRAM  telmisartan (MICARDIS) 40 MG tablet [id number] No Take 1 tablet (40 mg total) by mouth daily. Janora Norlander, DO Taking Active             Patient Active Problem List   Diagnosis Date Noted   Unstable angina ([place]) 06/18/2020   Moderate nonproliferative diabetic retinopathy of both eyes without macular edema associated with type 2 diabetes mellitus ([place]) 01/05/2019   Hypertension associated with diabetes ([place]) 09/11/2015   Spinal stenosis, lumbar region, with neurogenic claudication 04/25/2015   Arthritis associated with diabetes ([place]) 08/28/2014   Peripheral edema 08/28/2014   Diabetic neuropathy ([place]) 02/18/2014   External bleeding hemorrhoids 04/10/2013   Morbid (severe) obesity due to excess calories ([place]) 04/09/2013   Diabetes ([place]) 04/09/2013   Thrombocytopenia, unspecified ([place]) 04/09/2013   Chest pain 04/09/2013   OSA (obstructive sleep apnea) 07/06/2011   DOE (dyspnea on exertion) 06/06/2011    Coronary artery disease 02/03/2011   GERD (gastroesophageal reflux disease) 02/03/2011   Hyperlipidemia associated with type 2 diabetes mellitus ([place]) 02/03/2011   Noncompliance 02/03/2011   Generalized anxiety disorder 02/03/2011   Insomnia 02/03/2011   Cigarette smoker 02/03/2011    Immunization History  Administered Date(s) Administered   Influenza,inj,Quad PF,6+ Mos 08/10/2013, 08/28/2014, 09/25/2015, 11/23/2017, 11/27/2018   Pneumococcal Conjugate-13 11/27/2018    Conditions to be addressed/monitored: HTN, HLD, and DMII  Care Plan : PHARMD-MEDICATION MANAGEMENT  Updates made by Lavera Guise, [facility]  since 09/22/2021 12:00 AM     Problem: DISEASE PROGRESSION PREVENTION      Long-Range Goal: T2DM   Recent Progress: Not on track  Priority: High  Note:   Current Barriers:  Unable to independently afford treatment regimen Unable to achieve control of T2DM  Suboptimal therapeutic regimen for T2DM  Pharmacist Clinical Goal(s):  Over the next 90 days, patient will verbalize ability to afford treatment regimen achieve adherence to monitoring guidelines and medication adherence to achieve therapeutic efficacy achieve control of T2DM as evidenced by GOAL A1C  through collaboration with PharmD and provider.    Interventions: 1:1 collaboration with Janora Norlander, DO regarding development and update of comprehensive plan of care as evidenced by provider attestation and co-signature Inter-disciplinary care team collaboration (see longitudinal plan of care) Comprehensive medication review performed; medication list updated in electronic medical record  Diabetes: Uncontrolled; current treatment:BASAGLAR 45-50 units , METFORMIN, RYBELSUS 7MG  Current glucose readings: fasting glucose: <160, post prandial glucose: <200 Denies hypoglycemic/hyperglycemic symptoms Current meal patterns: Discussed meal planning options and Plate Method for healthy eating Avoid sugary drinks  and desserts Incorporate balanced protein, non starchy veggies, 1 serving of carbohydrate with each meal Increase water intake Increase physical activity as able Current exercise: unable due to pain; difficulty getting around; uses cane to walk Educated on medications; lifestyle modifications Recommended increase rybelsus to 50m daily CONTINUE rybelsus--tolerating well-->MAY INCREASE TO 14MG PENDING GI Denies history of thyroid cancer/MTC Patient shipment of rybelsus 729mgiven to patient in clinic (4 month supply)--AUTOMATIC REFILLS ENGAGED  Patient does not want to try another med for neuropathy/mood due to side effects, will continue to revisit  Financial-patient enrolled in [employer] patient assistance for Basaglar and novo nordisk PAP for Rybelsus  Hypertension -patient reports BP control is better (briefly discussed), compliant with medicine  Hyperlipidemia -LDL <70 at goal, continue current medication (crestor)  Patient Goals/Self-Care Activities Over the next 90 days, patient will:  - take medications as prescribed focus on medication adherence by REDUCING A1C TO GOAL <7% check glucose DAILY (FASTING), document, and provide at future appointments  Follow Up Plan: Telephone follow up appointment with care management team member scheduled for: 2 months      Medication Assistance:  BASAGLAR [place] through [place].  Enrollment ends 11/07/21 RYBELSUS OBTAINED VIA NOVO NOTwo ButtesPatient's preferred pharmacy is:  [place] [address] LakeC[place]hone: [phone]: [phone] (OpNorth Star- CaLangleyCASanta PaulaoDr [facility]on[address]hone: [phone]: [phone] HillKY - [id number]Long BeachSTE 20[place]KBliss CornerSTB[address]hone: [phone]: [phone] by McEnd[facility] [place] ValleyKY[place] [address] SuColumbus[address]hone: [phone]: [phone] Up:  Patient agrees to Care Plan and Follow-up.  Plan: Telephone follow up appointment with care management team member scheduled for:  2 MONTHS  JuRegina EckPharmD, BCPS Clinical Pharmacist, WeBay PointII Phone [phone]

## 2021-09-17 ENCOUNTER — Encounter: Payer: Self-pay | Admitting: Physical Medicine and Rehabilitation

## 2021-09-22 NOTE — Patient Instructions (Signed)
Visit Information   Current Barriers:  Unable to independently afford treatment regimen Unable to achieve control of T2DM  Suboptimal therapeutic regimen for T2DM  Pharmacist Clinical Goal(s):  Over the next 90 days, patient will verbalize ability to afford treatment regimen achieve adherence to monitoring guidelines and medication adherence to achieve therapeutic efficacy achieve control of T2DM as evidenced by GOAL A1C through collaboration with PharmD and provider.    Interventions: 1:1 collaboration with [name], DO regarding development and update of comprehensive plan of care as evidenced by provider attestation and co-signature Inter-disciplinary care team collaboration (see longitudinal plan of care) Comprehensive medication review performed; medication list updated in electronic medical record  Diabetes: Uncontrolled; current treatment:BASAGLAR 45-50 units , METFORMIN, RYBELSUS 7MG   Current glucose readings: fasting glucose: <160, post prandial glucose: <200 Denies hypoglycemic/hyperglycemic symptoms Current meal patterns: Discussed meal planning options and Plate Method for healthy eating Avoid sugary drinks and desserts Incorporate balanced protein, non starchy veggies, 1 serving of carbohydrate with each meal Increase water intake Increase physical activity as able Current exercise: unable due to pain; difficulty getting around; uses cane to walk Educated on medications; lifestyle modifications Recommended increase rybelsus to 7mg  daily CONTINUE rybelsus--tolerating well-->MAY INCREASE TO 14MG  PENDING GI Denies history of thyroid cancer/MTC Patient shipment of rybelsus 7mg  given to patient in clinic (4 month supply)--AUTOMATIC REFILLS ENGAGED  Patient does not want to try another med for neuropathy/mood due to side effects, will continue to revisit  Financial-patient enrolled in [employer] patient assistance for Basaglar and novo nordisk PAP for  Rybelsus  Hypertension -patient reports BP control is better (briefly discussed), compliant with medicine  Hyperlipidemia -LDL <70 at goal, continue current medication (crestor)  Patient Goals/Self-Care Activities Over the next 90 days, patient will:  - take medications as prescribed focus on medication adherence by REDUCING A1C TO GOAL <7% check glucose DAILY (FASTING), document, and provide at future appointments  Follow Up Plan: Telephone follow up appointment with care management team member scheduled for: 2 months  The patient verbalized understanding of instructions, educational materials, and care plan provided today and declined offer to receive copy of patient instructions, educational materials, and care plan.    Signature , PharmD, BCPS Clinical Pharmacist, Family Medicine [facility]  II Phone [phone]

## 2021-09-25 ENCOUNTER — Telehealth: Payer: Self-pay | Admitting: Family Medicine

## 2021-09-25 NOTE — Telephone Encounter (Signed)
Attempted to contact patient - NA °

## 2021-09-25 NOTE — Telephone Encounter (Signed)
Please let patient know:  Samples left up front for patient (rybelsus7mg  ) I do need her most recent proof of income to submit to company for patient assistance Please tell patient to bring

## 2021-10-07 ENCOUNTER — Ambulatory Visit: Payer: HMO | Admitting: Pharmacist

## 2021-10-07 DIAGNOSIS — Z7984 Long term (current) use of oral hypoglycemic drugs: Secondary | ICD-10-CM | POA: Diagnosis not present

## 2021-10-07 DIAGNOSIS — Z794 Long term (current) use of insulin: Secondary | ICD-10-CM

## 2021-10-07 DIAGNOSIS — E1142 Type 2 diabetes mellitus with diabetic polyneuropathy: Secondary | ICD-10-CM

## 2021-10-07 DIAGNOSIS — E1169 Type 2 diabetes mellitus with other specified complication: Secondary | ICD-10-CM

## 2021-10-07 DIAGNOSIS — E785 Hyperlipidemia, unspecified: Secondary | ICD-10-CM | POA: Diagnosis not present

## 2021-10-07 DIAGNOSIS — I10 Essential (primary) hypertension: Secondary | ICD-10-CM | POA: Diagnosis not present

## 2021-10-07 NOTE — Progress Notes (Signed)
Chronic Care Management Pharmacy Note  10/07/2021 Name:  QUANDRA FEDORCHAK MRN:  [id number] DOB:  28-May-1961  Summary: T2DM, HLD  Recommendations/Changes made from today's visit:  Diabetes: Uncontrolled--a1c 8.9%; current treatment:BASAGLAR 45-50 units , METFORMIN, RYBELSUS 7MG  Current glucose readings: fasting glucose: <160, post prandial glucose: <200 Denies hypoglycemic/hyperglycemic symptoms Current meal patterns: Discussed meal planning options and Plate Method for healthy eating Avoid sugary drinks and desserts Incorporate balanced protein, non starchy veggies, 1 serving of carbohydrate with each meal Increase water intake Increase physical activity as able Current exercise: unable due to pain; difficulty getting around; uses cane to walk Educated on medications; lifestyle modifications Recommended improve diet CONTINUE BASAGLAR & METFORMIN CONTINUE Rybelsus--tolerating well-->MAY INCREASE TO 14MG PENDING GI  Denies history of thyroid cancer/MTC Financial-re-enrolled patient in [employer] patient assistance for Basaglar and novo nordisk PAP for Rybelsus FOR 2023  Hypertension -patient reports BP control is better (briefly discussed), compliant with medicine  Hyperlipidemia -LDL <70 at goal, continue current medication (crestor) Lipid Panel     Component Value Date/Time   CHOL 91 (L) 08/08/2020 1035   TRIG 134 08/08/2020 1035   TRIG 211 (H) 12/30/2014 1537   HDL 36 (L) 08/08/2020 1035   HDL 48 12/30/2014 1537   CHOLHDL 2.5 08/08/2020 1035   CHOLHDL 4.5 04/09/2013 1135   VLDL 37 04/09/2013 1135   LDLCALC 31 08/08/2020 1035   LDLCALC 42 08/28/2014 1037   LABVLDL 24 08/08/2020 1035   Patient Goals/Self-Care Activities Over the next 90 days, patient will:  - take medications as prescribed focus on medication adherence by REDUCING A1C TO GOAL <7% check glucose DAILY (FASTING), document, and provide at future appointments  Follow Up Plan: Telephone  follow up appointment with care management team member scheduled for: 2 months  Subjective: NANCEY KREITZ is a 60 y.o. year old female who is a primary patient of Janora Norlander, DO.  The CCM team was consulted for assistance with disease management and care coordination needs.    Engaged with patient by telephone for follow up visit in response to provider referral for pharmacy case management and/or care coordination services.   Consent to Services:  The patient was given information about Chronic Care Management services, agreed to services, and gave verbal consent prior to initiation of services.  Please see initial visit note for detailed documentation.   Patient Care Team: Janora Norlander, DO as PCP - General (Family Medicine) Satira Sark, MD as PCP - Cardiology (Cardiology) Marcial Pacas, MD as Consulting Physician (Neurology) Latanya Maudlin, MD as Consulting Physician (Orthopedic Surgery) Satira Sark, MD as Consulting Physician (Cardiology) Ilean China, RN as Registered Nurse Lavera Guise, [facility] (Pharmacist)  Objective:  Lab Results  Component Value Date   CREATININE 1.36 (H) 06/12/2021   CREATININE 0.98 08/08/2020   CREATININE 1.16 (H) 06/19/2020    Lab Results  Component Value Date   HGBA1C 8.9 (H) 06/12/2021   Last diabetic Eye exam:  Lab Results  Component Value Date/Time   HMDIABEYEEXA Retinopathy (A) 12/25/2018 12:00 AM    Last diabetic Foot exam: No results found for: HMDIABFOOTEX      Component Value Date/Time   CHOL 91 (L) 08/08/2020 1035   TRIG 134 08/08/2020 1035   TRIG 211 (H) 12/30/2014 1537   HDL 36 (L) 08/08/2020 1035   HDL 48 12/30/2014 1537   CHOLHDL 2.5 08/08/2020 1035   CHOLHDL 4.5 04/09/2013 1135   VLDL 37 04/09/2013 1135  LDLCALC 31 08/08/2020 1035   LDLCALC 42 08/28/2014 1037    Hepatic Function Latest Ref Rng & Units 06/12/2021 08/08/2020 06/18/2020  Total Protein 6.0 - 8.5 g/dL 6.8 6.4 6.7  Albumin 3.8 -  4.8 g/dL 4.4 4.0 3.8  AST 0 - 40 IU/L _0 ALT 0 - 32 IU/L _1 Alk Phosphatase 44 - 121 IU/L 103 105 77  Total Bilirubin 0.0 - 1.2 mg/dL 0.3 0.2 0.4  Bilirubin, Direct 0.0 - 0.3 mg/dL - - -    Lab Results  Component Value Date/Time   TSH 5.31 (H) 06/05/2019 03:09 PM   TSH 8.526 (H) 04/09/2013 04:50 AM    CBC Latest Ref Rng & Units 06/12/2021 02/13/2021 11/05/2020  WBC 3.4 - 10.8 x10E3/uL 10.0 9.4 8.4  Hemoglobin 11.1 - 15.9 g/dL 14.2 12.9 13.6  Hematocrit 34.0 - 46.6 % 43.0 38.4 42.9  Platelets 150 - 450 x10E3/uL 100(LL) 101(L) 117(L)    No results found for: VD25OH  Clinical ASCVD: No  The ASCVD Risk score (Arnett DK, et al., 2019) failed to calculate for the following reasons:   The valid total cholesterol range is 130 to 320 mg/dL    Other: (CHADS2VASc if Afib, PHQ9 if depression, MMRC or CAT for COPD, ACT, DEXA)  Social History   Tobacco Use  Smoking Status Every Day   Packs/day: 1.00   Years: 47.00   Pack years: 47.00   Types: Cigarettes   Start date: 05/14/1972  Smokeless Tobacco Never   BP Readings from Last 3 Encounters:  06/12/21 131/76  02/13/21 (!) 89/53  11/05/20 121/69   Pulse Readings from Last 3 Encounters:  06/12/21 94  02/13/21 81  11/05/20 71   Wt Readings from Last 3 Encounters:  06/12/21 (!) 314 lb (142.4 kg)  02/13/21 (!) 323 lb (146.5 kg)  11/05/20 (!) 311 lb (141.1 kg)    Assessment: Review of patient past medical history, allergies, medications, health status, including review of consultants reports, laboratory and other test data, was performed as part of comprehensive evaluation and provision of chronic care management services.   SDOH:  (Social Determinants of Health) assessments and interventions performed:    CCM Care Plan  Allergies  Allergen Reactions   Duloxetine    Farxiga [Dapagliflozin]     RECURRENT YEAST INFECTIONS   Morphine And Related Nausea And Vomiting    Medications Reviewed Today     Reviewed by  Lavera Guise, [facility] (Pharmacist) on 10/07/21 at 1356  Med List Status: <None>   Medication Order Taking? Sig Documenting Provider Last Dose Status Informant  acetaminophen (TYLENOL) 325 MG tablet [id number] No Take 975 mg by mouth every 8 (eight) hours as needed. [provider] Taking Active   albuterol (VENTOLIN HFA) 108 (90 Base) MCG/ACT inhaler [id number] No Inhale 2 puffs into the lungs every 6 (six) hours as needed for wheezing or shortness of breath (for rescue). Ronnie Doss M, DO Taking Active   Alpha-Lipoic Acid 600 MG CAPS [id number] No Take 1 capsule (600 mg total) by mouth daily. For diabetic neuropathy Janora Norlander, DO Taking Active   aspirin EC 81 MG tablet [id number] No Take 1 tablet (81 mg total) by mouth daily. Verta Ellen., NP Taking Active   Blood Glucose Monitoring Suppl [facility] VERIO) w/Device Drucie Opitz [id number] No Use to test blood sugar twice daily as directed; DX: E11.69 Janora Norlander, DO Taking Active Self  diclofenac sodium (VOLTAREN) 1 % GEL [id number] No  Apply 4 g topically 4 (four) times daily. Ronnie Doss M, DO Taking Active Self  DULoxetine (CYMBALTA) 60 MG capsule [id number]  Take 1 capsule (60 mg total) by mouth daily. Janora Norlander, DO  Active            Med Note Lavera Guise   Tue Jun 23, 2021 11:48 AM) NOT TAKING  furosemide (LASIX) 40 MG tablet [id number]  TAKE 1 TABLET BY MOUTH ONCE DAILY AS NEEDED FOR EDEMA OR FLUID Ronnie Doss M, DO  Active   gabapentin (NEURONTIN) 600 MG tablet [id number] No Take 1.5 tablets (900 mg total) by mouth 3 (three) times daily. Ronnie Doss M, DO Taking Active   glucose blood (ONETOUCH VERIO) test strip [id number] No Use to test blood sugar twice daily as directed; DX: E11.69 Janora Norlander, DO Taking Active Self  Insulin Glargine (BASAGLAR KWIKPEN) 100 UNIT/ML [id number]  Inject 50 Units into the skin at bedtime. Janora Norlander, DO  Active            Med Note Blanca Friend,  Royce Macadamia   Wed Oct 07, 2021  1:56 PM) Via [employer] patient assistance program  metFORMIN (GLUCOPHAGE-XR) 500 MG 24 hr tablet [id number] No TAKE 2 TABLETS BY MOUTH ONCE DAILY WITH BREAKFAST Ronnie Doss M, DO Taking Active   metoprolol tartrate (LOPRESSOR) 25 MG tablet [id number] No Take 1 tablet by mouth twice daily Gottschalk, Ashly M, DO Taking Active   nitroGLYCERIN (NITROSTAT) 0.4 MG SL tablet [id number] No Place 1 tablet (0.4 mg total) under the tongue every 5 (five) minutes as needed for chest pain. Verta Ellen., NP Taking Active   omeprazole (PRILOSEC) 40 MG capsule [id number]  TAKE 1 CAPSULE BY MOUTH ONCE DAILY 30 MINUTES BEFORE MEAL(S) Ronnie Doss M, DO  Active   OneTouch Delica Lancets 81K MISC [id number] No Use to test blood sugar twice daily as directed; DX: E11.69 Janora Norlander, DO Taking Active Self  OneTouch Delica Lancets 97W MISC [id number] No Check blood sugar BID and prn  E11.9 Ronnie Doss M, DO Taking Active Self  rosuvastatin (CRESTOR) 20 MG tablet [id number]  TAKE 1 TABLET BY MOUTH AT BEDTIME Ronnie Doss M, DO  Active   Semaglutide (RYBELSUS) 7 MG TABS [id number] No Take 7 mg by mouth daily. [provider] Taking Active            Med Note Blanca Friend, Royce Macadamia   Tue Sep 22, 2021  3:08 PM) VIA [employer] PATIENT ASSISTANCE PROGRAM  telmisartan (MICARDIS) 40 MG tablet [id number] No Take 1 tablet (40 mg total) by mouth daily. Janora Norlander, DO Taking Active             Patient Active Problem List   Diagnosis Date Noted   Unstable angina ([place]) 06/18/2020   Moderate nonproliferative diabetic retinopathy of both eyes without macular edema associated with type 2 diabetes mellitus ([place]) 01/05/2019   Hypertension associated with diabetes ([place]) 09/11/2015   Spinal stenosis, lumbar region, with neurogenic claudication 04/25/2015   Arthritis associated with diabetes ([place]) 08/28/2014   Peripheral edema 08/28/2014   Diabetic neuropathy  ([place]) 02/18/2014   External bleeding hemorrhoids 04/10/2013   Morbid (severe) obesity due to excess calories ([place]) 04/09/2013   Diabetes ([place]) 04/09/2013   Thrombocytopenia, unspecified ([place]) 04/09/2013   Chest pain 04/09/2013   OSA (obstructive sleep apnea) 07/06/2011   DOE (dyspnea on exertion) 06/06/2011   Coronary artery disease 02/03/2011   GERD (gastroesophageal reflux disease) 02/03/2011  Hyperlipidemia associated with type 2 diabetes mellitus ([place]) 02/03/2011   Noncompliance 02/03/2011   Generalized anxiety disorder 02/03/2011   Insomnia 02/03/2011   Cigarette smoker 02/03/2011    Immunization History  Administered Date(s) Administered   Influenza,inj,Quad PF,6+ Mos 08/10/2013, 08/28/2014, 09/25/2015, 11/23/2017, 11/27/2018   Pneumococcal Conjugate-13 11/27/2018    Conditions to be addressed/monitored: HLD and DMII  Care Plan : PHARMD-MEDICATION MANAGEMENT  Updates made by Lavera Guise, [place] since 10/07/2021 12:00 AM     Problem: DISEASE PROGRESSION PREVENTION      Long-Range Goal: T2DM   Recent Progress: Not on track  Priority: High  Note:   Current Barriers:  Unable to independently afford treatment regimen Unable to achieve control of T2DM  Suboptimal therapeutic regimen for T2DM  Pharmacist Clinical Goal(s):  Over the next 90 days, patient will verbalize ability to afford treatment regimen achieve adherence to monitoring guidelines and medication adherence to achieve therapeutic efficacy achieve control of T2DM as evidenced by GOAL A1C  through collaboration with PharmD and provider.    Interventions: 1:1 collaboration with Janora Norlander, DO regarding development and update of comprehensive plan of care as evidenced by provider attestation and co-signature Inter-disciplinary care team collaboration (see longitudinal plan of care) Comprehensive medication review performed; medication list updated in electronic medical  record  Diabetes: Uncontrolled--a1c 8.9%; current treatment:BASAGLAR 45-50 units , METFORMIN, RYBELSUS 7MG  Current glucose readings: fasting glucose: <160, post prandial glucose: <200 Denies hypoglycemic/hyperglycemic symptoms Current meal patterns: Discussed meal planning options and Plate Method for healthy eating Avoid sugary drinks and desserts Incorporate balanced protein, non starchy veggies, 1 serving of carbohydrate with each meal Increase water intake Increase physical activity as able Current exercise: unable due to pain; difficulty getting around; uses cane to walk Educated on medications; lifestyle modifications Recommended improve diet CONTINUE [place] well-->MAY INCREASE TO 14MG PENDING GI  Denies history of thyroid cancer/MTC Financial-re-enrolled patient in [employer] patient assistance for Basaglar and novo nordisk PAP for Rybelsus FOR 2023  Hypertension -patient reports BP control is better (briefly discussed), compliant with medicine  Hyperlipidemia -LDL <70 at goal, continue current medication (crestor) Lipid Panel     Component Value Date/Time   CHOL 91 (L) 08/08/2020 1035   TRIG 134 08/08/2020 1035   TRIG 211 (H) 12/30/2014 1537   HDL 36 (L) 08/08/2020 1035   HDL 48 12/30/2014 1537   CHOLHDL 2.5 08/08/2020 1035   CHOLHDL 4.5 04/09/2013 1135   VLDL 37 04/09/2013 1135   LDLCALC 31 08/08/2020 1035   LDLCALC 42 08/28/2014 1037   LABVLDL 24 08/08/2020 1035    Patient Goals/Self-Care Activities Over the next 90 days, patient will:  - take medications as prescribed focus on medication adherence by REDUCING A1C TO GOAL <7% check glucose DAILY (FASTING), document, and provide at future appointments  Follow Up Plan: Telephone follow up appointment with care management team member scheduled for: 2 months      Medication Assistance: Application for BASAGLAR/[employer] AND RYBELSUS/[employer] [place]   medication assistance program. in process.  Anticipated assistance start date TBD.  See plan of care for additional detail.  Patient's preferred pharmacy is:  [facility] [address], [place] [address] [place] [address] Phone: [phone] Fax: [phone]  [facility] - [employer], [place] [facility] [address] [place] [address] [place] [address] Phone: [phone] Fax: [phone]  [place], [place] -  [address], [address] [place], [place] [address] Phone: [phone] Fax: [phone]  [facility] [place], [place] - [address] [place] [address] Phone: [phone] Fax: [phone]  Uses pill box? No - N/A Pt endorses 100% compliance  Follow Up:  Patient agrees to Care Plan and Follow-up.  Plan: Telephone follow up appointment with care management team member scheduled for:  2 MONTHS   Regina Eck, PharmD, BCPS Clinical Pharmacist, [place]  II Phone [phone]

## 2021-10-07 NOTE — Patient Instructions (Signed)
Visit Information  Thank you for taking time to visit with me today. Please don't hesitate to contact me if I can be of assistance to you before our next scheduled telephone appointment.  Following are the goals we discussed today:  Current Barriers:  Unable to independently afford treatment regimen Unable to achieve control of T2DM  Suboptimal therapeutic regimen for T2DM  Pharmacist Clinical Goal(s):  Over the next 90 days, patient will verbalize ability to afford treatment regimen achieve adherence to monitoring guidelines and medication adherence to achieve therapeutic efficacy achieve control of T2DM as evidenced by GOAL A1C through collaboration with PharmD and provider.    Interventions: 1:1 collaboration with [name], DO regarding development and update of comprehensive plan of care as evidenced by provider attestation and co-signature Inter-disciplinary care team collaboration (see longitudinal plan of care) Comprehensive medication review performed; medication list updated in electronic medical record  Diabetes: Uncontrolled--a1c 8.9%; current treatment:BASAGLAR 45-50 units , METFORMIN, RYBELSUS 7MG   Current glucose readings: fasting glucose: <160, post prandial glucose: <200 Denies hypoglycemic/hyperglycemic symptoms Current meal patterns: Discussed meal planning options and Plate Method for healthy eating Avoid sugary drinks and desserts Incorporate balanced protein, non starchy veggies, 1 serving of carbohydrate with each meal Increase water intake Increase physical activity as able Current exercise: unable due to pain; difficulty getting around; uses cane to walk Educated on medications; lifestyle modifications Recommended improve diet CONTINUE BASAGLAR & METFORMIN CONTINUE Rybelsus--tolerating well-->MAY INCREASE TO 14MG  PENDING GI  Denies history of thyroid cancer/MTC Financial-re-enrolled patient in [employer] patient assistance for  Basaglar and novo nordisk PAP for Rybelsus FOR 2023  Hypertension -patient reports BP control is better (briefly discussed), compliant with medicine  Hyperlipidemia -LDL <70 at goal, continue current medication (crestor) Lipid Panel     Component Value Date/Time   CHOL 91 (L) 08/08/2020 1035   TRIG 134 08/08/2020 1035   TRIG 211 (H) 12/30/2014 1537   HDL 36 (L) 08/08/2020 1035   HDL 48 12/30/2014 1537   CHOLHDL 2.5 08/08/2020 1035   CHOLHDL 4.5 04/09/2013 1135   VLDL 37 04/09/2013 1135   LDLCALC 31 08/08/2020 1035   LDLCALC 42 08/28/2014 1037   LABVLDL 24 08/08/2020 1035     Patient Goals/Self-Care Activities Over the next 90 days, patient will:  - take medications as prescribed focus on medication adherence by REDUCING A1C TO GOAL <7% check glucose DAILY (FASTING), document, and provide at future appointments  Follow Up Plan: Telephone follow up appointment with care management team member scheduled for: 2 months   Please call the care guide team at [phone] if you need to cancel or reschedule your appointment.   The patient verbalized understanding of instructions, educational materials, and care plan provided today and declined offer to receive copy of patient instructions, educational materials, and care plan.    10/08/2020, PharmD, BCPS Clinical Pharmacist, [facility]  II Phone [phone]

## 2021-11-10 ENCOUNTER — Telehealth: Payer: Self-pay | Admitting: Family Medicine

## 2021-11-10 NOTE — Telephone Encounter (Signed)
lmtcb

## 2021-11-10 NOTE — Telephone Encounter (Signed)
Samples of rybelsus left up front (we only have the 3mg  tablets) Patient can take 2-3 tablets daily until her shipments arrive Patient assistance has been on backorder--we will call her as soon as we received medication

## 2021-11-18 NOTE — Telephone Encounter (Signed)
Patient aware.  She picked these up already.  She is almost out again.  She is bringing her husband in tomorrow for an appointment and wants to know if you have any more samples available she can pick up then.

## 2021-11-19 NOTE — Telephone Encounter (Signed)
We will try to get her some tomorrow when she is here

## 2021-11-20 ENCOUNTER — Other Ambulatory Visit: Payer: Self-pay | Admitting: Family Medicine

## 2021-11-20 DIAGNOSIS — K219 Gastro-esophageal reflux disease without esophagitis: Secondary | ICD-10-CM

## 2021-11-20 NOTE — Telephone Encounter (Signed)
Samples given rybelsus 3mg  #30 to [facility]

## 2021-11-24 NOTE — Progress Notes (Signed)
Received notification from [employer] regarding approval for RYBELSUS. Patient assistance approved from 11/12/21 to 10/07/22. ° °Phone: [phone] ° °

## 2021-12-02 ENCOUNTER — Ambulatory Visit (INDEPENDENT_AMBULATORY_CARE_PROVIDER_SITE_OTHER): Payer: HMO | Admitting: Pharmacist

## 2021-12-02 ENCOUNTER — Ambulatory Visit: Payer: HMO | Admitting: Family Medicine

## 2021-12-02 DIAGNOSIS — E1165 Type 2 diabetes mellitus with hyperglycemia: Secondary | ICD-10-CM

## 2021-12-02 DIAGNOSIS — E1159 Type 2 diabetes mellitus with other circulatory complications: Secondary | ICD-10-CM

## 2021-12-02 MED ORDER — BASAGLAR KWIKPEN 100 UNIT/ML ~~LOC~~ SOPN: 60.0000 [IU] | PEN_INJECTOR | Freq: Every day | SUBCUTANEOUS | 12 refills | Status: DC

## 2021-12-02 NOTE — Progress Notes (Signed)
Chronic Care Management Pharmacy Note 12/02/2021 Name:  [name] MRN:  [id number] DOB:  1960-12-11  Summary: t2dm, htn  Recommendations/Changes made from today's visit: Diabetes: Uncontrolled--a1c 8.9%, gfr 5; current treatment: BASAGLAR 45-50 units-->increased to 6o units daily , METFORMIN, RYBELSUS 7MG  Insulin requirements have increased Patient gets via lilly care patient assistnace program Dose change form submitted Consider increasing rybelsus to 13m daily as tolerated Denies personal and family history of Medullary thyroid cancer ([name] Patient enrolled in novo nordisk patient assistance program Shipments have been delayed up to 60 days Additional samples given to patient  Current glucose readings: fasting glucose: <160, post prandial glucose: <200 Denies hypoglycemic/hyperglycemic symptoms Current meal patterns: Discussed meal planning options and Plate Method for healthy eating Avoid sugary drinks and desserts Incorporate balanced protein, non starchy veggies, 1 serving of carbohydrate with each meal Increase water intake Increase physical activity as able Current exercise: unable due to pain; difficulty getting around; uses cane to walk Educated on medications; lifestyle modifications Recommended improve diet Financial-re-enrolled patient in lilly cares [employer] patient assistance for [employer] novo nordisk PAP for Rybelsus FOR 2023  Hypertension -patient reports BP control is better (briefly discussed), compliant with medicine  Hyperlipidemia -LDL <70 at goal, continue current medication (crestor) Lipid Panel     Component Value Date/Time   CHOL 91 (L) 08/08/2020 1035   TRIG 134 08/08/2020 1035   TRIG 211 (H) 12/30/2014 1537   HDL 36 (L) 08/08/2020 1035   HDL 48 12/30/2014 1537   CHOLHDL 2.5 08/08/2020 1035   CHOLHDL 4.5 04/09/2013 1135   VLDL 37 04/09/2013 1135   LDLCALC 31 08/08/2020 1035   LDLCALC 42 08/28/2014 1037   LABVLDL 24 08/08/2020 1035    Patient Goals/Self-Care Activities Over the next 90 days, patient will:  - take medications as prescribed focus on medication adherence by REDUCING A1C TO GOAL <7% check glucose DAILY (FASTING), document, and provide at future appointments  Follow Up Plan: Telephone follow up appointment with care management team member scheduled for: 2 months  Subjective: Stacey LUTEis an 61y.o. year old female who is a primary patient of GJanora Norlander DO.  The CCM team was consulted for assistance with disease management and care coordination needs.    Engaged with patient by telephone for follow up visit in response to provider referral for pharmacy case management and/or care coordination services.   Consent to Services:  The patient was given information about Chronic Care Management services, agreed to services, and gave verbal consent prior to initiation of services.  Please see initial visit note for detailed documentation.   Patient Care Team: GJanora Norlander DO as PCP - General (Family Medicine) MSatira Sark MD as PCP - Cardiology (Cardiology) YMarcial Pacas MD as Consulting Physician (Neurology) GLatanya Maudlin MD as Consulting Physician (Orthopedic Surgery) MSatira Sark MD as Consulting Physician (Cardiology) HIlean China RN as Registered Nurse [facility]a [facility]ise [facility](Pharmacist)   Objective:  Lab Results  Component Value Date   CREATININE 1.36 (H) 06/12/2021   CREATININE 0.98 08/08/2020   CREATININE 1.16 (H) 06/19/2020    Lab Results  Component Value Date   HGBA1C 8.9 (H) 06/12/2021   Last diabetic Eye exam:  Lab Results  Component Value Date/Time   HMDIABEYEEXA Retinopathy (A) 12/25/2018 12:00 AM    Last diabetic Foot exam: No results found for: HMDIABFOOTEX      Component Value Date/Time   CHOL 91 (L) 08/08/2020 1035  TRIG 134 08/08/2020 1035   TRIG 211 (H) 12/30/2014 1537   HDL 36 (L) 08/08/2020 1035   HDL 48 12/30/2014 1537    CHOLHDL 2.5 08/08/2020 1035   CHOLHDL 4.5 04/09/2013 1135   VLDL 37 04/09/2013 1135   LDLCALC 31 08/08/2020 1035   LDLCALC 42 08/28/2014 1037    Hepatic Function Latest Ref Rng & Units 06/12/2021 08/08/2020 06/18/2020  Total Protein 6.0 - 8.5 g/dL 6.8 6.4 6.7  Albumin 3.8 - 4.8 g/dL 4.4 4.0 3.8  AST 0 - 40 IU/L _0 ALT 0 - 32 IU/L _1 Alk Phosphatase 44 - 121 IU/L 103 105 77  Total Bilirubin 0.0 - 1.2 mg/dL 0.3 0.2 0.4  Bilirubin, Direct 0.0 - 0.3 mg/dL - - -    Lab Results  Component Value Date/Time   TSH 5.31 (H) 06/05/2019 03:09 PM   TSH 8.526 (H) 04/09/2013 04:50 AM    CBC Latest Ref Rng & Units 06/12/2021 02/13/2021 11/05/2020  WBC 3.4 - 10.8 x10E3/uL 10.0 9.4 8.4  Hemoglobin 11.1 - 15.9 g/dL 14.2 12.9 13.6  Hematocrit 34.0 - 46.6 % 43.0 38.4 42.9  Platelets 150 - 450 x10E3/uL 100(LL) 101(L) 117(L)    No results found for: VD25OH  Clinical ASCVD: No  The ASCVD Risk score (Arnett DK, et al., 2019) failed to calculate for the following reasons:   The valid total cholesterol range is 130 to 320 mg/dL    Other: (CHADS2VASc if Afib, PHQ9 if depression, MMRC or CAT for COPD, ACT, DEXA)  Social History   Tobacco Use  Smoking Status Every Day   Packs/day: 1.00   Years: 47.00   Pack years: 47.00   Types: Cigarettes   Start date: 05/14/1972  Smokeless Tobacco Never   BP Readings from Last 3 Encounters:  06/12/21 131/76  02/13/21 (!) 89/53  11/05/20 121/69   Pulse Readings from Last 3 Encounters:  06/12/21 94  02/13/21 81  11/05/20 71   Wt Readings from Last 3 Encounters:  06/12/21 (!) 314 lb (142.4 kg)  02/13/21 (!) 323 lb (146.5 kg)  11/05/20 (!) 311 lb (141.1 kg)    Assessment: Review of patient past medical history, allergies, medications, health status, including review of consultants reports, laboratory and other test data, was performed as part of comprehensive evaluation and provision of chronic care management services.   SDOH:  (Social  Determinants of Health) assessments and interventions performed:    CCM Care Plan  Allergies  Allergen Reactions   Duloxetine    Farxiga [Dapagliflozin]     RECURRENT YEAST INFECTIONS   Morphine And Related Nausea And Vomiting    Medications Reviewed Today     Reviewed by Lavera Guise, [facility] (Pharmacist) on 12/13/21 at 2357  Med List Status: <None>   Medication Order Taking? Sig Documenting Provider Last Dose Status Informant  acetaminophen (TYLENOL) 325 MG tablet [id number] No Take 975 mg by mouth every 8 (eight) hours as needed. [provider] Taking Active   albuterol (VENTOLIN HFA) 108 (90 Base) MCG/ACT inhaler [id number] No Inhale 2 puffs into the lungs every 6 (six) hours as needed for wheezing or shortness of breath (for rescue). Ronnie Doss M, DO Taking Active   Alpha-Lipoic Acid 600 MG CAPS [id number] No Take 1 capsule (600 mg total) by mouth daily. For diabetic neuropathy Janora Norlander, DO Taking Active   aspirin EC 81 MG tablet [id number] No Take 1 tablet (81 mg total) by mouth daily. Levell July  Kaylyn Lim., NP Taking Active   Blood Glucose Monitoring Suppl [facility] VERIO) w/Device KIT [id number] No Use to test blood sugar twice daily as directed; DX: E11.69 Janora Norlander, DO Taking Active Self  diclofenac sodium (VOLTAREN) 1 % GEL [id number] No Apply 4 g topically 4 (four) times daily. Ronnie Doss M, DO Taking Active Self  DULoxetine (CYMBALTA) 60 MG capsule [id number]  Take 1 capsule (60 mg total) by mouth daily. Janora Norlander, DO  Active            Med Note Lavera Guise   Tue Jun 23, 2021 11:48 AM) NOT TAKING  furosemide (LASIX) 40 MG tablet [id number]  TAKE 1 TABLET BY MOUTH ONCE DAILY AS NEEDED FOR EDEMA OR FLUID Ronnie Doss M, DO  Active   gabapentin (NEURONTIN) 600 MG tablet [id number] No Take 1.5 tablets (900 mg total) by mouth 3 (three) times daily. Ronnie Doss M, DO Taking Active   glucose blood (ONETOUCH VERIO) test  strip [id number] No Use to test blood sugar twice daily as directed; DX: E11.69 Janora Norlander, DO Taking Active Self  Insulin Glargine (BASAGLAR KWIKPEN) 100 UNIT/ML [id number]  Inject 60 Units into the skin at bedtime. Janora Norlander, DO  Active            Med Note Blanca Friend, Omer Jack Dec 13, 2021 11:57 PM) [place] patient assistance program    metFORMIN (GLUCOPHAGE-XR) 500 MG 24 hr tablet [id number]  TAKE 2 TABLETS BY MOUTH ONCE DAILY WITH BREAKFAST   (NEEDS TO BE SEEN BEFORE NEXT REFILL) Ronnie Doss M, DO  Active   metoprolol tartrate (LOPRESSOR) 25 MG tablet [id number] No Take 1 tablet by mouth twice daily Gottschalk, Ashly M, DO Taking Active   nitroGLYCERIN (NITROSTAT) 0.4 MG SL tablet [id number] No Place 1 tablet (0.4 mg total) under the tongue every 5 (five) minutes as needed for chest pain. Verta Ellen., NP Taking Active   omeprazole (PRILOSEC) 40 MG capsule [id number]  TAKE 1 CAPSULE BY MOUTH ONCE DAILY 30  MINUTES  BEFORE  A  MEAL (NEEDS TO BE SEEN BEFORE NEXT REFILL) Ronnie Doss M, DO  Active   OneTouch Delica Lancets 83E MISC [id number] No Use to test blood sugar twice daily as directed; DX: E11.69 Janora Norlander, DO Taking Active Self  OneTouch Delica Lancets 11S MISC [id number] No Check blood sugar BID and prn  E11.9 Ronnie Doss M, DO Taking Active Self  rosuvastatin (CRESTOR) 20 MG tablet [id number]  TAKE 1 TABLET BY MOUTH AT BEDTIME Ronnie Doss M, DO  Active   Semaglutide (RYBELSUS) 7 MG TABS [id number] No Take 7 mg by mouth daily. [provider] Taking Active            Med Note Blanca Friend, Royce Macadamia   Tue Sep 22, 2021  3:08 PM) VIA NOVO NORDISK PATIENT ASSISTANCE PROGRAM  telmisartan (MICARDIS) 40 MG tablet [id number] No Take 1 tablet (40 mg total) by mouth daily. Janora Norlander, DO Taking Active             Patient Active Problem List   Diagnosis Date Noted   Unstable angina ([place]) 06/18/2020   Moderate  nonproliferative diabetic retinopathy of both eyes without macular edema associated with type 2 diabetes mellitus ([place]) 01/05/2019   Hypertension associated with diabetes ([place]) 09/11/2015   Spinal stenosis, lumbar region, with neurogenic claudication 04/25/2015   Arthritis associated with diabetes ([place]) 08/28/2014   Peripheral edema 08/28/2014  Diabetic neuropathy ([place]) 02/18/2014   External bleeding hemorrhoids 04/10/2013   Morbid (severe) obesity due to excess calories ([place]) 04/09/2013   Diabetes ([place]) 04/09/2013   Thrombocytopenia, unspecified ([place]) 04/09/2013   Chest pain 04/09/2013   OSA (obstructive sleep apnea) 07/06/2011   DOE (dyspnea on exertion) 06/06/2011   Coronary artery disease 02/03/2011   GERD (gastroesophageal reflux disease) 02/03/2011   Hyperlipidemia associated with type 2 diabetes mellitus ([place]) 02/03/2011   Noncompliance 02/03/2011   Generalized anxiety disorder 02/03/2011   Insomnia 02/03/2011   Cigarette smoker 02/03/2011    Immunization History  Administered Date(s) Administered   Influenza,inj,Quad PF,6+ Mos 08/10/2013, 08/28/2014, 09/25/2015, 11/23/2017, 11/27/2018   Pneumococcal Conjugate-13 11/27/2018    Conditions to be addressed/monitored: HTN and DMII  There are no care plans that you recently modified to display for this patient.   Medication Assistance:  approved for novo pap and lilly cares--awaiting all shipments  Patient's preferred pharmacy is:  [facility] [address] [place] - [address] [place] [address] Phone: [phone] Fax: [phone]  [facility] - [place], [facility] [address] [place] [address] [place] [address] Phone: [phone] Fax: [phone]  [place], [place] - [address], [address] [place], [place] [address] Phone: [phone] Fax: [phone]  [facility] [place], [place] - [address] [place] [address] Phone: [phone] Fax: [place], [address] [place] [address] Phone: [phone] Fax: [phone]  Uses pill box? No - n/a Pt endorses 100% compliance  Follow Up:  Patient agrees to Care Plan and Follow-up.  Plan: Telephone follow up appointment with care management team member scheduled for:  1 month   Regina Eck, PharmD, BCPS Clinical Pharmacist, [place]  II Phone [phone]

## 2021-12-08 DIAGNOSIS — E1159 Type 2 diabetes mellitus with other circulatory complications: Secondary | ICD-10-CM

## 2021-12-08 DIAGNOSIS — I152 Hypertension secondary to endocrine disorders: Secondary | ICD-10-CM

## 2021-12-08 DIAGNOSIS — E1165 Type 2 diabetes mellitus with hyperglycemia: Secondary | ICD-10-CM | POA: Diagnosis not present

## 2021-12-10 ENCOUNTER — Telehealth: Payer: Self-pay | Admitting: Family Medicine

## 2021-12-10 ENCOUNTER — Other Ambulatory Visit: Payer: Self-pay | Admitting: Family Medicine

## 2021-12-11 NOTE — Telephone Encounter (Signed)
No, we have not.  Shipments continued to be delayed by company/backorders Please have patient call [phone] to check on her shipment please I should have more samples in by next week

## 2021-12-11 NOTE — Telephone Encounter (Signed)
Patient aware and verbalized understanding. °

## 2021-12-13 NOTE — Patient Instructions (Signed)
Visit Information  Following are the goals we discussed today:  Current Barriers:  Unable to independently afford treatment regimen Unable to achieve control of T2DM  Suboptimal therapeutic regimen for T2DM  Pharmacist Clinical Goal(s):  Over the next 90 days, patient will verbalize ability to afford treatment regimen achieve adherence to monitoring guidelines and medication adherence to achieve therapeutic efficacy achieve control of T2DM as evidenced by GOAL A1C through collaboration with PharmD and provider.   Interventions: 1:1 collaboration with [name], DO regarding development and update of comprehensive plan of care as evidenced by provider attestation and co-signature Inter-disciplinary care team collaboration (see longitudinal plan of care) Comprehensive medication review performed; medication list updated in electronic medical record  Diabetes: Uncontrolled--a1c 8.9%; current treatment: BASAGLAR 45-50 units-->increased to 6o units daily , METFORMIN, RYBELSUS 7MG   Insulin requirements have increased Patient gets via lilly care patient assistnace program Dose change form submitted Consider increasing rybelsus to 14mg  daily as tolerated Denies personal and family history of Medullary thyroid cancer ([place]) Patient enrolled in novo nordisk patient assistance program Shipments have been delayed up to 60 days Additional samples given to patient  Current glucose readings: fasting glucose: <160, post prandial glucose: <200 Denies hypoglycemic/hyperglycemic symptoms Current meal patterns: Discussed meal planning options and Plate Method for healthy eating Avoid sugary drinks and desserts Incorporate balanced protein, non starchy veggies, 1 serving of carbohydrate with each meal Increase water intake Increase physical activity as able Current exercise: unable due to pain; difficulty getting around; uses cane to walk Educated on medications; lifestyle  modifications Recommended improve diet Financial-re-enrolled patient in lilly cares [employer] patient assistance for and novo nordisk PAP for Rybelsus FOR 2023  Hypertension -patient reports BP control is better (briefly discussed), compliant with medicine  Hyperlipidemia -LDL <70 at goal, continue current medication (crestor) Lipid Panel     Component Value Date/Time   CHOL 91 (L) 08/08/2020 1035   TRIG 134 08/08/2020 1035   TRIG 211 (H) 12/30/2014 1537   HDL 36 (L) 08/08/2020 1035   HDL 48 12/30/2014 1537   CHOLHDL 2.5 08/08/2020 1035   CHOLHDL 4.5 04/09/2013 1135   VLDL 37 04/09/2013 1135   LDLCALC 31 08/08/2020 1035   LDLCALC 42 08/28/2014 1037   LABVLDL 24 08/08/2020 1035     Patient Goals/Self-Care Activities Over the next 90 days, patient will:  - take medications as prescribed focus on medication adherence by REDUCING A1C TO GOAL <7% check glucose DAILY (FASTING), document, and provide at future appointments  Follow Up Plan: Telephone follow up appointment with care management team member scheduled for: 2 months   Plan: Telephone follow up appointment with care management team member scheduled for:  1 month  Signature 08/30/2014, PharmD, BCPS Clinical Pharmacist, 10/08/2020 Family Medicine [facility]  II Phone [phone]  Please call the care guide team at [phone] if you need to cancel or reschedule your appointment.   Patient verbalizes understanding of instructions and care plan provided today and agrees to view in MyChart. Active MyChart status confirmed with patient.

## 2021-12-20 ENCOUNTER — Other Ambulatory Visit: Payer: Self-pay | Admitting: Family Medicine

## 2021-12-20 DIAGNOSIS — K219 Gastro-esophageal reflux disease without esophagitis: Secondary | ICD-10-CM

## 2021-12-20 DIAGNOSIS — E1159 Type 2 diabetes mellitus with other circulatory complications: Secondary | ICD-10-CM

## 2021-12-20 DIAGNOSIS — I152 Hypertension secondary to endocrine disorders: Secondary | ICD-10-CM

## 2021-12-20 DIAGNOSIS — E1169 Type 2 diabetes mellitus with other specified complication: Secondary | ICD-10-CM

## 2021-12-23 ENCOUNTER — Telehealth: Payer: Self-pay | Admitting: Family Medicine

## 2021-12-23 ENCOUNTER — Encounter: Payer: HMO | Attending: Physical Medicine and Rehabilitation | Admitting: Physical Medicine and Rehabilitation

## 2021-12-23 DIAGNOSIS — E1165 Type 2 diabetes mellitus with hyperglycemia: Secondary | ICD-10-CM

## 2021-12-23 NOTE — Telephone Encounter (Signed)
Pt has not been able to get her Insulin Glargine (BASAGLAR KWIKPEN) 100 UNIT/ML refill. It comes in mail order. She tried to call pharmacy today and was on the phone for two hours and na. She does not the know the pharmacy. She called [phone]. Pt has enough for the rest of this week.

## 2021-12-24 MED ORDER — BASAGLAR KWIKPEN 100 UNIT/ML ~~LOC~~ SOPN: 60.0000 [IU] | PEN_INJECTOR | Freq: Every day | SUBCUTANEOUS | 12 refills | Status: DC

## 2021-12-24 NOTE — Telephone Encounter (Signed)
Spoke with pt.  Informed her that I spoke with [facility] about her shipment. Basaglar is estimated to sometime next week. Also gave pt her their direct number to reach out for shipment questions [phone]

## 2021-12-24 NOTE — Telephone Encounter (Signed)
Can you f/u basaglar? Patient has been unsuccessful in getting I know I faxed dose change form 12/02/21 I just escribed to [facility] as well Will you let patient know?  Thank you!

## 2021-12-28 ENCOUNTER — Ambulatory Visit (INDEPENDENT_AMBULATORY_CARE_PROVIDER_SITE_OTHER): Payer: HMO

## 2021-12-28 VITALS — Wt 314.0 lb

## 2021-12-28 DIAGNOSIS — Z0000 Encounter for general adult medical examination without abnormal findings: Secondary | ICD-10-CM

## 2021-12-28 DIAGNOSIS — Z599 Problem related to housing and economic circumstances, unspecified: Secondary | ICD-10-CM | POA: Diagnosis not present

## 2021-12-28 DIAGNOSIS — Z1231 Encounter for screening mammogram for malignant neoplasm of breast: Secondary | ICD-10-CM

## 2021-12-28 NOTE — Patient Instructions (Signed)
Ms. Dost , Thank you for taking time to come for your Medicare Wellness Visit. I appreciate your ongoing commitment to your health goals. Please review the following plan we discussed and let me know if I can assist you in the future.   Screening recommendations/referrals: Colonoscopy: Done 04/10/2013 - Repeat in 5 years *past due - please call for appointment soon Mammogram: Done 10/17/2018 - Repeat annually *Past due - I sent order today Bone Density: Due at age 10 Recommended yearly ophthalmology/optometry visit for glaucoma screening and checkup Recommended yearly dental visit for hygiene and checkup  Vaccinations: declines vaccines at this time Influenza vaccine: Due Pneumococcal vaccine: [id number] done 11/27/2018 - due for Pneumovax-23 Tdap vaccine: Due Shingles vaccine: Due  Covid-19: Due  Advanced directives: Advance directive discussed with you today. Even though you declined this today, please call our office should you change your mind, and we can give you the proper paperwork for you to fill out.   Conditions/risks identified: Aim for 30 minutes of exercise or brisk walking each day, drink 6-8 glasses of water and eat lots of fruits and vegetables.   Next appointment: Follow up in one year for your annual wellness visit.   Preventive Care 40-64 Years, Female Preventive care refers to lifestyle choices and visits with your health care provider that can promote health and wellness. What does preventive care include? A yearly physical exam. This is also called an annual well check. Dental exams once or twice a year. Routine eye exams. Ask your health care provider how often you should have your eyes checked. Personal lifestyle choices, including: Daily care of your teeth and gums. Regular physical activity. Eating a healthy diet. Avoiding tobacco and drug use. Limiting alcohol use. Practicing safe sex. Taking low-dose aspirin daily starting at age 72. Taking vitamin and  mineral supplements as recommended by your health care provider. What happens during an annual well check? The services and screenings done by your health care provider during your annual well check will depend on your age, overall health, lifestyle risk factors, and family history of disease. Counseling  Your health care provider may ask you questions about your: Alcohol use. Tobacco use. Drug use. Emotional well-being. Home and relationship well-being. Sexual activity. Eating habits. Work and work Statistician. Method of birth control. Menstrual cycle. Pregnancy history. Screening  You may have the following tests or measurements: Height, weight, and BMI. Blood pressure. Lipid and cholesterol levels. These may be checked every 5 years, or more frequently if you are over 24 years old. Skin check. Lung cancer screening. You may have this screening every year starting at age 30 if you have a 30-pack-year history of smoking and currently smoke or have quit within the past 15 years. Fecal occult blood test (FOBT) of the stool. You may have this test every year starting at age 22. Flexible sigmoidoscopy or colonoscopy. You may have a sigmoidoscopy every 5 years or a colonoscopy every 10 years starting at age 46. Hepatitis C blood test. Hepatitis B blood test. Sexually transmitted disease (STD) testing. Diabetes screening. This is done by checking your blood sugar (glucose) after you have not eaten for a while (fasting). You may have this done every 1-3 years. Mammogram. This may be done every 1-2 years. Talk to your health care provider about when you should start having regular mammograms. This may depend on whether you have a family history of breast cancer. BRCA-related cancer screening. This may be done if you have a family history  of breast, ovarian, tubal, or peritoneal cancers. Pelvic exam and Pap test. This may be done every 3 years starting at age 66. Starting at age 95, this may  be done every 5 years if you have a Pap test in combination with an HPV test. Bone density scan. This is done to screen for osteoporosis. You may have this scan if you are at high risk for osteoporosis. Discuss your test results, treatment options, and if necessary, the need for more tests with your health care provider. Vaccines  Your health care provider may recommend certain vaccines, such as: Influenza vaccine. This is recommended every year. Tetanus, diphtheria, and acellular pertussis (Tdap, Td) vaccine. You may need a Td booster every 10 years. Zoster vaccine. You may need this after age 36. Pneumococcal 13-valent conjugate (PCV13) vaccine. You may need this if you have certain conditions and were not previously vaccinated. Pneumococcal polysaccharide (PPSV23) vaccine. You may need one or two doses if you smoke cigarettes or if you have certain conditions. Talk to your health care provider about which screenings and vaccines you need and how often you need them. This information is not intended to replace advice given to you by your health care provider. Make sure you discuss any questions you have with your health care provider. Document Released: 11/21/2015 Document Revised: 07/14/2016 Document Reviewed: 08/26/2015 Elsevier Interactive Patient Education  2017 [employer] Prevention in the Home Falls can cause injuries. They can happen to people of all ages. There are many things you can do to make your home safe and to help prevent falls. What can I do on the outside of my home? Regularly fix the edges of walkways and driveways and fix any cracks. Remove anything that might make you trip as you walk through a door, such as a raised step or threshold. Trim any bushes or trees on the path to your home. Use bright outdoor lighting. Clear any walking paths of anything that might make someone trip, such as rocks or tools. Regularly check to see if handrails are loose or  broken. Make sure that both sides of any steps have handrails. Any raised decks and porches should have guardrails on the edges. Have any leaves, snow, or ice cleared regularly. Use sand or salt on walking paths during winter. Clean up any spills in your garage right away. This includes oil or grease spills. What can I do in the bathroom? Use night lights. Install grab bars by the toilet and in the tub and shower. Do not use towel bars as grab bars. Use non-skid mats or decals in the tub or shower. If you need to sit down in the shower, use a plastic, non-slip stool. Keep the floor dry. Clean up any water that spills on the floor as soon as it happens. Remove soap buildup in the tub or shower regularly. Attach bath mats securely with double-sided non-slip rug tape. Do not have throw rugs and other things on the floor that can make you trip. What can I do in the bedroom? Use night lights. Make sure that you have a light by your bed that is easy to reach. Do not use any sheets or blankets that are too big for your bed. They should not hang down onto the floor. Have a firm chair that has side arms. You can use this for support while you get dressed. Do not have throw rugs and other things on the floor that can make you trip.  What can I do in the kitchen? Clean up any spills right away. Avoid walking on wet floors. Keep items that you use a lot in easy-to-reach places. If you need to reach something above you, use a strong step stool that has a grab bar. Keep electrical cords out of the way. Do not use floor polish or wax that makes floors slippery. If you must use wax, use non-skid floor wax. Do not have throw rugs and other things on the floor that can make you trip. What can I do with my stairs? Do not leave any items on the stairs. Make sure that there are handrails on both sides of the stairs and use them. Fix handrails that are broken or loose. Make sure that handrails are as long as  the stairways. Check any carpeting to make sure that it is firmly attached to the stairs. Fix any carpet that is loose or worn. Avoid having throw rugs at the top or bottom of the stairs. If you do have throw rugs, attach them to the floor with carpet tape. Make sure that you have a light switch at the top of the stairs and the bottom of the stairs. If you do not have them, ask someone to add them for you. What else can I do to help prevent falls? Wear shoes that: Do not have high heels. Have rubber bottoms. Are comfortable and fit you well. Are closed at the toe. Do not wear sandals. If you use a stepladder: Make sure that it is fully opened. Do not climb a closed stepladder. Make sure that both sides of the stepladder are locked into place. Ask someone to hold it for you, if possible. Clearly mark and make sure that you can see: Any grab bars or handrails. First and last steps. Where the edge of each step is. Use tools that help you move around (mobility aids) if they are needed. These include: Canes. Walkers. Scooters. Crutches. Turn on the lights when you go into a dark area. Replace any light bulbs as soon as they burn out. Set up your furniture so you have a clear path. Avoid moving your furniture around. If any of your floors are uneven, fix them. If there are any pets around you, be aware of where they are. Review your medicines with your doctor. Some medicines can make you feel dizzy. This can increase your chance of falling. Ask your doctor what other things that you can do to help prevent falls. This information is not intended to replace advice given to you by your health care provider. Make sure you discuss any questions you have with your health care provider. Document Released: 08/21/2009 Document Revised: 04/01/2016 Document Reviewed: 11/29/2014 Elsevier Interactive Patient Education  2017 [employer].

## 2021-12-28 NOTE — Progress Notes (Signed)
Subjective:   [name] is a 61 y.o. female who presents for Medicare Annual (Subsequent) preventive examination.  Virtual Visit via Telephone Note  I connected with  LISSETTE SCHENK on 12/28/21 at  8:15 AM EST by telephone and verified that I am speaking with the correct person using two identifiers.  Location: Patient: Home Provider: [facility] Persons participating in the virtual visit: patient/Nurse Health Advisor   I discussed the limitations, risks, security and privacy concerns of performing an evaluation and management service by telephone and the availability of in person appointments. The patient expressed understanding and agreed to proceed.  Interactive audio and video telecommunications were attempted between this nurse and patient, however failed, due to patient having technical difficulties OR patient did not have access to video capability.  We continued and completed visit with audio only.  Some vital signs may be absent or patient reported.   Shayda Kalka E Ryder Chesmore, LPN   Review of Systems     Cardiac Risk Factors include: advanced age (>49mn, >>47women);diabetes mellitus;sedentary lifestyle;obesity (BMI >30kg/m2);smoking/ tobacco exposure;family history of premature cardiovascular disease;hypertension;dyslipidemia;Other (see comment), Risk factor comments: CAD, atherosclerosis     Objective:    Today's Vitals   12/28/21 0824 12/28/21 0825  Weight: (!) 314 lb (142.4 kg)   PainSc:  7    Body mass index is 57.43 kg/m.  Advanced Directives 12/28/2021 12/25/2020 09/23/2020 06/18/2020 06/18/2020 12/21/2019 11/27/2018  Does Patient Have a Medical Advance Directive? No No No No No No No  Would patient like information on creating a medical advance directive? No - Patient declined No - Patient declined No - Patient declined No - Patient declined - No - Patient declined Yes (MAU/Ambulatory/Procedural Areas - Information given)  Pre-existing out of facility DNR order (yellow form or  pink MOST form) - - - - - - -    Current Medications (verified) Outpatient Encounter Medications as of 12/28/2021  Medication Sig   acetaminophen (TYLENOL) 325 MG tablet Take 975 mg by mouth every 8 (eight) hours as needed.   Alpha-Lipoic Acid 600 MG CAPS Take 1 capsule (600 mg total) by mouth daily. For diabetic neuropathy   Blood Glucose Monitoring Suppl (ONETOUCH VERIO) w/Device KIT Use to test blood sugar twice daily as directed; DX: E11.69   diclofenac sodium (VOLTAREN) 1 % GEL Apply 4 g topically 4 (four) times daily.   gabapentin (NEURONTIN) 600 MG tablet Take 1.5 tablets (900 mg total) by mouth 3 (three) times daily.   glimepiride (AMARYL) 2 MG tablet Take 2 mg by mouth daily.   glucose blood (ONETOUCH VERIO) test strip Use to test blood sugar twice daily as directed; DX: E11.69   Insulin Glargine (BASAGLAR KWIKPEN) 100 UNIT/ML Inject 60 Units into the skin at bedtime.   metFORMIN (GLUCOPHAGE-XR) 500 MG 24 hr tablet TAKE 2 TABLETS BY MOUTH ONCE DAILY WITH BREAKFAST   (NEEDS TO BE SEEN BEFORE NEXT REFILL)   metoprolol tartrate (LOPRESSOR) 25 MG tablet Take 1 tablet by mouth twice daily   omeprazole (PRILOSEC) 40 MG capsule TAKE 1 CAPSULE BY MOUTH ONCE DAILY 30 MINUTES BEFORE A MEAL   OneTouch Delica Lancets 312XMISC Use to test blood sugar twice daily as directed; DX: EN17.00  OneTouch Delica Lancets 317CMISC Check blood sugar BID and prn  E11.9   rosuvastatin (CRESTOR) 20 MG tablet TAKE 1 TABLET BY MOUTH AT BEDTIME   Semaglutide (RYBELSUS) 7 MG TABS Take 7 mg by mouth daily.   telmisartan (MICARDIS) 40  MG tablet Take 1 tablet (40 mg total) by mouth daily.   albuterol (VENTOLIN HFA) 108 (90 Base) MCG/ACT inhaler Inhale 2 puffs into the lungs every 6 (six) hours as needed for wheezing or shortness of breath (for rescue). (Patient not taking: Reported on 12/28/2021)   aspirin EC 81 MG tablet Take 1 tablet (81 mg total) by mouth daily. (Patient not taking: Reported on 12/28/2021)    furosemide (LASIX) 40 MG tablet TAKE 1 TABLET BY MOUTH ONCE DAILY AS NEEDED FOR EDEMA OR FLUID (Patient not taking: Reported on 12/28/2021)   nitroGLYCERIN (NITROSTAT) 0.4 MG SL tablet Place 1 tablet (0.4 mg total) under the tongue every 5 (five) minutes as needed for chest pain. (Patient not taking: Reported on 12/28/2021)   [DISCONTINUED] DULoxetine (CYMBALTA) 60 MG capsule Take 1 capsule (60 mg total) by mouth daily. (Patient not taking: Reported on 12/28/2021)   No facility-administered encounter medications on file as of 12/28/2021.    Allergies (verified) Duloxetine, Farxiga [dapagliflozin], and Morphine and related   History: Past Medical History:  Diagnosis Date   Arthritis    CAD (coronary artery disease)    BMS to circumflex 2007 - Dr. Olevia Perches   CKD (chronic kidney disease) stage 3, GFR 30-59 ml/min (HCC)    Essential hypertension    Falls frequently    GERD (gastroesophageal reflux disease)    HA (headache)    Hyperlipidemia    Left-sided face pain    Morbid obesity ([place])    Noncompliance    OSA (obstructive sleep apnea)    Uses CPAP   Type 2 diabetes mellitus ([place])    Urinary incontinence    Past Surgical History:  Procedure Laterality Date   ABDOMINAL HERNIA REPAIR     ABDOMINAL HYSTERECTOMY     BACK SURGERY     BREAST SURGERY     biopsy per right breast; pt states has silver piece in for marking    Cataract surgery Bilateral    CHOLECYSTECTOMY     COLONOSCOPY N/A 04/10/2013   Procedure: COLONOSCOPY;  Surgeon: Rogene Houston, MD;  Location[facility];  Service: Endoscopy;  Laterality: N/A;   CORONARY ANGIOPLASTY WITH STENT PLACEMENT  2012   LEFT HEART CATH AND CORONARY ANGIOGRAPHY N/A 06/19/2020   Procedure: LEFT HEART CATH AND CORONARY ANGIOGRAPHY;  Surgeon: Belva Crome, MD;  Location: [facility];  Service: Cardiovascular;  Laterality: N/A;   LUMBAR LAMINECTOMY/DECOMPRESSION MICRODISCECTOMY N/A 04/25/2015   Procedure: CENTRAL LUMBAR /DECOMPRESSION  L4-L5;  Surgeon: Latanya Maudlin, MD;  Location: [facility];  Service: Orthopedics;  Laterality: N/A;   TUBAL LIGATION     Family History  Problem Relation Age of Onset   Breast cancer Mother    Cancer Father    Coronary artery disease Father    Cancer - Colon Father    Diabetes Father    High Cholesterol Father    Arthritis Sister    Asthma Sister    High Cholesterol Daughter    Thyroid disease Daughter    Hiatal hernia Son    Congenital heart disease Son    Social History   Socioeconomic History   Marital status: Married    Spouse name: Gwyndolyn Saxon   Number of children: 5   Years of education: 10 th   Highest education level: 10th grade  Occupational History   Occupation: disabled     Comment: disabled  Tobacco Use   Smoking status: Every Day    Packs/day: 1.00  Years: 47.00    Pack years: 47.00    Types: Cigarettes    Start date: 05/14/1972   Smokeless tobacco: Never  Vaping Use   Vaping Use: Never used  Substance and Sexual Activity   Alcohol use: No    Alcohol/week: 0.0 standard drinks   Drug use: No   Sexual activity: Not Currently    Birth control/protection: Surgical  Other Topics Concern   Not on file  Social History Narrative   Patient lives at home with her husband. Patient is disabled.   Children live nearby   Patient has 10 th grade education.   Right handed.   Caffeine- one  cup of coffee and  One soda Dr.Pepper/ tea. daily   Social Determinants of Health   Financial Resource Strain: Medium Risk   Difficulty of Paying Living Expenses: Somewhat hard  Food Insecurity: No Food Insecurity   Worried About Charity fundraiser in the Last Year: Never true   Ran Out of Food in the Last Year: Never true  Transportation Needs: Unmet Transportation Needs   Lack of Transportation (Medical): Yes   Lack of Transportation (Non-Medical): No  Physical Activity: Inactive   Days of Exercise per Week: 0 days   Minutes of Exercise per Session: 0 min  Stress: Stress  Concern Present   Feeling of Stress : To some extent  Social Connections: Moderately Integrated   Frequency of Communication with Friends and Family: More than three times a week   Frequency of Social Gatherings with Friends and Family: Three times a week   Attends Religious Services: Never   Active Member of Clubs or Organizations: No   Attends Music therapist: 1 to 4 times per year   Marital Status: Married    Tobacco Counseling Ready to quit: Not Answered Counseling given: Not Answered   Clinical Intake:  Pre-visit preparation completed: Yes  Pain : 0-10 Pain Score: 7  Pain Type: Chronic pain Pain Location: Leg Pain Orientation: Right, Left Pain Descriptors / Indicators: Aching Pain Onset: More than a month ago Pain Frequency: Intermittent     BMI - recorded: 57.43 Nutritional Status: BMI > 30  Obese Nutritional Risks: None Diabetes: Yes CBG done?: No Did pt. bring in CBG monitor from home?: No  How often do you need to have someone help you when you read instructions, pamphlets, or other written materials from your doctor or pharmacy?: 1 - Never  Diabetic? Nutrition Risk Assessment:  Has the patient had any N/V/D within the last 2 months?  No  Does the patient have any non-healing wounds?  No  Has the patient had any unintentional weight loss or weight gain?  No   Diabetes:  Is the patient diabetic?  Yes  If diabetic, was a CBG obtained today?  No  Did the patient bring in their glucometer from home?  No  How often do you monitor your CBG's? 2-3 times per day.   Financial Strains and Diabetes Management:  Are you having any financial strains with the device, your supplies or your medication? No .  Does the patient want to be seen by Chronic Care Management for management of their diabetes?  No  Would the patient like to be referred to a Nutritionist or for Diabetic Management?  No   Diabetic Exams:  Diabetic Eye Exam: Completed 2022.    Diabetic Foot Exam: Completed 08/08/2020. Pt has been advised about the importance in completing this exam. Pt is scheduled for diabetic  foot exam on 01/2022.    Interpreter Needed?: No  Information entered by :: Leann Mayweather, LPN   Activities of Daily Living In your present state of health, do you have any difficulty performing the following activities: 12/28/2021  Hearing? N  Vision? N  Difficulty concentrating or making decisions? Y  Walking or climbing stairs? Y  Dressing or bathing? N  Doing errands, shopping? Y  Preparing Food and eating ? Y  Using the Toilet? N  In the past six months, have you accidently leaked urine? Y  Comment wears pads for protection  Do you have problems with loss of bowel control? Y  Comment urge incontinence  Managing your Medications? N  Managing your Finances? N  Housekeeping or managing your Housekeeping? Y  Some recent data might be hidden    Patient Care Team: Janora Norlander, DO as PCP - General (Family Medicine) Satira Sark, MD as PCP - Cardiology (Cardiology) Marcial Pacas, MD as Consulting Physician (Neurology) Latanya Maudlin, MD as Consulting Physician (Orthopedic Surgery) Satira Sark, MD as Consulting Physician (Cardiology) Ilean China, RN as Registered Nurse Blanca Friend, Royce Macadamia, [facility] (Pharmacist)  Indicate any recent Medical Services you may have received from other than [facility] providers in the past year (date may be approximate).     Assessment:   This is a routine wellness examination for Erlene.  Hearing/Vision screen Hearing Screening - Comments:: Denies hearing difficulties   Vision Screening - Comments:: Wears otc reading glasses - up to date with routine eye exams with [facility]  Dietary issues and exercise activities discussed: Current Exercise Habits: The patient does not participate in regular exercise at present, Exercise limited by: orthopedic condition(s);neurologic condition(s);cardiac  condition(s)   Goals Addressed             This Visit's Progress    Quit smoking / using tobacco   On track      Depression Screen PHQ 2/9 Scores 12/28/2021 06/12/2021 02/13/2021 12/25/2020 11/05/2020 03/03/2020 03/03/2020  PHQ - 2 Score 4 6 0 '2 2 4 ' 0  PHQ- 9 Score '8 19 11 7 7 16 ' -  Exception Documentation - - - - - - -  Not completed - - - - - - -    Fall Risk Fall Risk  12/28/2021 06/12/2021 02/13/2021 12/25/2020 08/08/2020  Falls in the past year? 0 0 0 0 0  Comment - - - - -  Number falls in past yr: 0 - - - -  Injury with Fall? 0 - - - -  Comment - - - - -  Risk Factor Category  - - - - -  Risk for fall due to : Impaired balance/gait;Medication side effect - - - -  Risk for fall due to: Comment - - - - -  Follow up Falls prevention discussed;Education provided - - - -    FALL RISK PREVENTION PERTAINING TO THE HOME:  Any stairs in or around the home? No  If so, are there any without handrails? No  Home free of loose throw rugs in walkways, pet beds, electrical cords, etc? Yes  Adequate lighting in your home to reduce risk of falls? Yes   ASSISTIVE DEVICES UTILIZED TO PREVENT FALLS:  Life alert? No  Use of a cane, walker or w/c? Yes  Grab bars in the bathroom? Yes  Shower chair or bench in shower? Yes  Elevated toilet seat or a handicapped toilet? No   TIMED UP AND GO:  Was  the test performed? No . Telephonic visit  Cognitive Function: MMSE - Mini Mental State Exam 11/27/2018 11/23/2017  Orientation to time 5 5  Orientation to Place 5 5  Registration 3 3  Attention/ Calculation 5 5  Recall 3 2  Language- name 2 objects 2 2  Language- repeat 1 1  Language- follow 3 step command 3 3  Language- read & follow direction 1 1  Write a sentence 1 1  Copy design 1 1  Total score 30 29     6CIT Screen 12/28/2021 12/25/2020 12/21/2019  What Year? 0 points 0 points 0 points  What month? 0 points 0 points 0 points  What time? 0 points 0 points 0 points  Count back from 20 0  points 0 points 0 points  Months in reverse 4 points 4 points 0 points  Repeat phrase 2 points 2 points 0 points  Total Score 6 6 0    Immunizations Immunization History  Administered Date(s) Administered   Influenza,inj,Quad PF,6+ Mos 08/10/2013, 08/28/2014, 09/25/2015, 11/23/2017, 11/27/2018   Pneumococcal Conjugate-13 11/27/2018    TDAP status: Due, Education has been provided regarding the importance of this vaccine. Advised may receive this vaccine at local pharmacy or Health Dept. Aware to provide a copy of the vaccination record if obtained from local pharmacy or Health Dept. Verbalized acceptance and understanding.  Flu Vaccine status: Declined, Education has been provided regarding the importance of this vaccine but patient still declined. Advised may receive this vaccine at local pharmacy or Health Dept. Aware to provide a copy of the vaccination record if obtained from local pharmacy or Health Dept. Verbalized acceptance and understanding.  Pneumococcal vaccine status: Declined,  Education has been provided regarding the importance of this vaccine but patient still declined. Advised may receive this vaccine at local pharmacy or Health Dept. Aware to provide a copy of the vaccination record if obtained from local pharmacy or Health Dept. Verbalized acceptance and understanding.   Covid-19 vaccine status: Declined, Education has been provided regarding the importance of this vaccine but patient still declined. Advised may receive this vaccine at local pharmacy or Health Dept.or vaccine clinic. Aware to provide a copy of the vaccination record if obtained from local pharmacy or Health Dept. Verbalized acceptance and understanding.  Qualifies for Shingles Vaccine? Yes   Zostavax completed No   Shingrix Completed?: No.    Education has been provided regarding the importance of this vaccine. Patient has been advised to call insurance company to determine out of pocket expense if they have  not yet received this vaccine. Advised may also receive vaccine at local pharmacy or Health Dept. Verbalized acceptance and understanding.  Screening Tests Health Maintenance  Topic Date Due   COVID-19 Vaccine (1) Never done   Zoster Vaccines- Shingrix (1 of 2) Never done   OPHTHALMOLOGY EXAM  12/26/2019   INFLUENZA VACCINE  06/08/2021   FOOT EXAM  08/08/2021   HEMOGLOBIN A1C  12/13/2021   TETANUS/TDAP  02/13/2022 (Originally 05/10/1978)   MAMMOGRAM  06/12/2022 (Originally 10/17/2020)   COLONOSCOPY (Pts 45-65yr Insurance coverage will need to be confirmed)  06/12/2022 (Originally 04/10/2018)   Hepatitis C Screening  Completed   HIV Screening  Completed   HPV VACCINES  Aged Out   PAP SMEAR-Modifier  Discontinued    Health Maintenance  Health Maintenance Due  Topic Date Due   COVID-19 Vaccine (1) Never done   Zoster Vaccines- Shingrix (1 of 2) Never done   OPHTHALMOLOGY EXAM  12/26/2019  INFLUENZA VACCINE  06/08/2021   FOOT EXAM  08/08/2021   HEMOGLOBIN A1C  12/13/2021    Colorectal cancer screening: Type of screening: Colonoscopy. Completed 04/10/2013. Repeat every 5 years past due - discuss repeat with pcp  Mammogram status: Ordered 12/28/2021. Pt provided with contact info and advised to call to schedule appt.   DEXA due at age 14  Lung Cancer Screening: (Low Dose CT Chest recommended if Age 54-80 years, 30 pack-year currently smoking OR have quit w/in 15years.) does qualify.   Lung Cancer Screening Referral: to discuss with pcp  Additional Screening:  Hepatitis C Screening: does qualify; Completed 09/11/2015  Vision Screening: Recommended annual ophthalmology exams for early detection of glaucoma and other disorders of the eye. Is the patient up to date with their annual eye exam?  Yes  Who is the provider or what is the name of the office in which the patient attends annual eye exams? [facility] If pt is not established with a provider, would they like to be  referred to a provider to establish care? No .   Dental Screening: Recommended annual dental exams for proper oral hygiene  Community Resource Referral / Chronic Care Management: CRR required this visit?  Yes   CCM required this visit?  No      Plan:     I have personally reviewed and noted the following in the patients chart:   Medical and social history Use of alcohol, tobacco or illicit drugs  Current medications and supplements including opioid prescriptions.  Functional ability and status Nutritional status Physical activity Advanced directives List of other physicians Hospitalizations, surgeries, and ER visits in previous 12 months Vitals Screenings to include cognitive, depression, and falls Referrals and appointments  In addition, I have reviewed and discussed with patient certain preventive protocols, quality metrics, and best practice recommendations. A written personalized care plan for preventive services as well as general preventive health recommendations were provided to patient.     Sandrea Hammond, LPN   [id number]   Nurse Notes: Due for low dose chest ct lung cancer screening, colonoscopy and mammogram, declining vaccines at this time. Advised to discuss screenings with PCP at next visit. (I did put in order for mammogram today)

## 2021-12-29 ENCOUNTER — Telehealth: Payer: Self-pay

## 2021-12-29 NOTE — Telephone Encounter (Signed)
° °  Telephone encounter was:  Successful.  12/29/2021 Name: [name] MRN: [id number] DOB: Oct 25, 1961  [name] is a 61 y.o. year old female who is a primary care patient of Janora Norlander, DO . The community resource team was consulted for assistance with Food Insecurity and Financial Difficulties related to financial strain  Care guide performed the following interventions: Patient provided with information about care guide support team and interviewed to confirm resource needs.Patient stated she is having financial strain and is requesting resources to help with food bills and medication  Follow Up Plan:  Care guide will follow up with patient by phone over the next week    [place], Care Management  [phone] [address], [place], [place] [address] Phone: [phone] Email: [email or web address]

## 2022-01-01 ENCOUNTER — Telehealth: Payer: Self-pay | Admitting: Family Medicine

## 2022-01-01 NOTE — Telephone Encounter (Signed)
[name] sample put aside for pt, she will pick up today

## 2022-01-01 NOTE — Telephone Encounter (Signed)
If we have [place] or [place], okay to give her a pack of that.  Her instructions for use will be exactly the same as the Basaglar.  We will reach out to pharmacy to check in on meds.  I assume that she is getting this through a patient assistance program?

## 2022-01-01 NOTE — Telephone Encounter (Signed)
No samples, pt aware. She is concerned will she be ok without med?

## 2022-01-04 ENCOUNTER — Other Ambulatory Visit: Payer: Self-pay | Admitting: Family Medicine

## 2022-01-05 ENCOUNTER — Other Ambulatory Visit: Payer: Self-pay | Admitting: Family Medicine

## 2022-01-05 ENCOUNTER — Ambulatory Visit (INDEPENDENT_AMBULATORY_CARE_PROVIDER_SITE_OTHER): Payer: HMO | Admitting: Pharmacist

## 2022-01-05 DIAGNOSIS — E1169 Type 2 diabetes mellitus with other specified complication: Secondary | ICD-10-CM

## 2022-01-05 DIAGNOSIS — E1142 Type 2 diabetes mellitus with diabetic polyneuropathy: Secondary | ICD-10-CM

## 2022-01-05 DIAGNOSIS — E785 Hyperlipidemia, unspecified: Secondary | ICD-10-CM

## 2022-01-05 DIAGNOSIS — E1165 Type 2 diabetes mellitus with hyperglycemia: Secondary | ICD-10-CM

## 2022-01-05 DIAGNOSIS — E1159 Type 2 diabetes mellitus with other circulatory complications: Secondary | ICD-10-CM

## 2022-01-05 DIAGNOSIS — I152 Hypertension secondary to endocrine disorders: Secondary | ICD-10-CM

## 2022-01-06 NOTE — Progress Notes (Signed)
Chronic Care Management Pharmacy Note  01/05/2022 Name:  [name] MRN:  [id number] DOB:  December 06, 1960  Summary: t2dm, htn  Recommendations/Changes made from today's visit:  Diabetes: Uncontrolled--A1C 8.9%, GFR 44; current treatment: BASAGLAR 40 units, METFORMIN, increase RYBELSUS to 67m Patient to have a1c checked next week, increasing dose for glycemic control (decrease in insulin) & hopeful weight loss Insulin requirements have decreased Patient gets via [employer] patient assistance program Increasing rybelsus to 144mdaily as tolerated  Subjective: [name] an [age].o. year old female who is a primary patient of GoJanora NorlanderDO.  The CCM team was consulted for assistance with disease management and care coordination needs.    Engaged with patient face to face for follow up visit in response to provider referral for pharmacy case management and/or care coordination services.   Consent to Services:  The patient was given information about Chronic Care Management services, agreed to services, and gave verbal consent prior to initiation of services.  Please see initial visit note for detailed documentation.   Patient Care Team: GoJanora NorlanderDO as PCP - General (Family Medicine) McSatira SarkMD as PCP - Cardiology (Cardiology) YaMarcial PacasMD as Consulting Physician (Neurology) GiLatanya MaudlinMD as Consulting Physician (Orthopedic Surgery) McSatira SarkMD as Consulting Physician (Cardiology) HuIlean ChinaRN as Registered Nurse PrLavera GuiseRPEast Valley EndoscopyPharmacist)  Objective:  Lab Results  Component Value Date   CREATININE 1.36 (H) 06/12/2021   CREATININE 0.98 08/08/2020   CREATININE 1.16 (H) 06/19/2020    Lab Results  Component Value Date   HGBA1C 8.9 (H) 06/12/2021   Last diabetic Eye exam:  Lab Results  Component Value Date/Time   HMDIABEYEEXA Retinopathy (A) 12/25/2018 12:00 AM    Last diabetic Foot exam: No results  found for: HMDIABFOOTEX      Component Value Date/Time   CHOL 91 (L) 08/08/2020 1035   TRIG 134 08/08/2020 1035   TRIG 211 (H) 12/30/2014 1537   HDL 36 (L) 08/08/2020 1035   HDL 48 12/30/2014 1537   CHOLHDL 2.5 08/08/2020 1035   CHOLHDL 4.5 04/09/2013 1135   VLDL 37 04/09/2013 1135   LDLCALC 31 08/08/2020 1035   LDLCALC 42 08/28/2014 1037    Hepatic Function Latest Ref Rng & Units 06/12/2021 08/08/2020 06/18/2020  Total Protein 6.0 - 8.5 g/dL 6.8 6.4 6.7  Albumin 3.8 - 4.8 g/dL 4.4 4.0 3.8  AST 0 - 40 IU/L '13 13 17  ' ALT 0 - 32 IU/L '12 11 16  ' Alk Phosphatase 44 - 121 IU/L 103 105 77  Total Bilirubin 0.0 - 1.2 mg/dL 0.3 0.2 0.4  Bilirubin, Direct 0.0 - 0.3 mg/dL - - -    Lab Results  Component Value Date/Time   TSH 5.31 (H) 06/05/2019 03:09 PM   TSH 8.526 (H) 04/09/2013 04:50 AM    CBC Latest Ref Rng & Units 06/12/2021 02/13/2021 11/05/2020  WBC 3.4 - 10.8 x10E3/uL 10.0 9.4 8.4  Hemoglobin 11.1 - 15.9 g/dL 14.2 12.9 13.6  Hematocrit 34.0 - 46.6 % 43.0 38.4 42.9  Platelets 150 - 450 x10E3/uL 100(LL) 101(L) 117(L)    No results found for: VD25OH  Clinical ASCVD: No  The ASCVD Risk score (Arnett DK, et al., 2019) failed to calculate for the following reasons:   The valid total cholesterol range is 130 to 320 mg/dL    Other: (CHADS2VASc if Afib, PHQ9 if depression, MMRC or CAT for COPD, ACT, DEXA)  Social History   Tobacco Use  Smoking Status Every Day   Packs/day: 1.00   Years: 47.00   Pack years: 47.00   Types: Cigarettes   Start date: 05/14/1972  Smokeless Tobacco Never   BP Readings from Last 3 Encounters:  06/12/21 131/76  02/13/21 (!) 89/53  11/05/20 121/69   Pulse Readings from Last 3 Encounters:  06/12/21 94  02/13/21 81  11/05/20 71   Wt Readings from Last 3 Encounters:  12/28/21 (!) 314 lb (142.4 kg)  06/12/21 (!) 314 lb (142.4 kg)  02/13/21 (!) 323 lb (146.5 kg)    Assessment: Review of patient past medical history, allergies, medications, health  status, including review of consultants reports, laboratory and other test data, was performed as part of comprehensive evaluation and provision of chronic care management services.   SDOH:  (Social Determinants of Health) assessments and interventions performed:    CCM Care Plan  Allergies  Allergen Reactions   Duloxetine    Farxiga [Dapagliflozin]     RECURRENT YEAST INFECTIONS   Morphine And Related Nausea And Vomiting    Medications Reviewed Today     Reviewed by Lavera Guise, [facility] (Pharmacist) on 01/06/22 at 3  Med List Status: <None>   Medication Order Taking? Sig Documenting Provider Last Dose Status Informant  acetaminophen (TYLENOL) 325 MG tablet [id number] No Take 975 mg by mouth every 8 (eight) hours as needed. [provider] Taking Active   albuterol (VENTOLIN HFA) 108 (90 Base) MCG/ACT inhaler [id number] No Inhale 2 puffs into the lungs every 6 (six) hours as needed for wheezing or shortness of breath (for rescue).  Patient not taking: Reported on 12/28/2021   Janora Norlander, DO Not Taking Active   Alpha-Lipoic Acid 600 MG CAPS [id number] No Take 1 capsule (600 mg total) by mouth daily. For diabetic neuropathy Janora Norlander, DO Taking Active   aspirin EC 81 MG tablet [id number] No Take 1 tablet (81 mg total) by mouth daily.  Patient not taking: Reported on 12/28/2021   Verta Ellen., NP Not Taking Active            Med Note Georgiann Cocker, [place] E   Mon Dec 28, 2021  8:29 AM) Taking 325  Blood Glucose Monitoring Suppl [facility] VERIO) w/Device KIT [id number] No Use to test blood sugar twice daily as directed; DX: E11.69 Janora Norlander, DO Taking Active Self  diclofenac sodium (VOLTAREN) 1 % GEL [id number] No Apply 4 g topically 4 (four) times daily. Janora Norlander, DO Taking Active Self  furosemide (LASIX) 40 MG tablet [id number] No TAKE 1 TABLET BY MOUTH ONCE DAILY AS NEEDED FOR EDEMA OR FLUID  Patient not taking: Reported on 12/28/2021    Janora Norlander, DO Not Taking Active   gabapentin (NEURONTIN) 600 MG tablet [id number] No Take 1.5 tablets (900 mg total) by mouth 3 (three) times daily. Ronnie Doss M, DO Taking Active   glimepiride (AMARYL) 2 MG tablet [id number] No Take 2 mg by mouth daily. [provider] Taking Active   glucose blood (ONETOUCH VERIO) test strip [id number] No Use to test blood sugar twice daily as directed; DX: E11.69 Janora Norlander, DO Taking Active Self  Insulin Glargine (BASAGLAR KWIKPEN) 100 UNIT/ML [id number] No Inject 60 Units into the skin at bedtime.  Patient taking differently: Inject 40 Units into the skin at bedtime.   Janora Norlander, DO Taking Active            Med  Note (HOPKINS, AMY E   Mon Dec 28, 2021  8:30 AM) 45-50 U  metFORMIN (GLUCOPHAGE-XR) 500 MG 24 hr tablet [id number]  TAKE 2 TABLETS BY MOUTH ONCE DAILY WITH BREAKFAST Ronnie Doss M, DO  Active   metoprolol tartrate (LOPRESSOR) 25 MG tablet [id number] No Take 1 tablet by mouth twice daily Gottschalk, Ashly M, DO Taking Active   nitroGLYCERIN (NITROSTAT) 0.4 MG SL tablet [id number] No Place 1 tablet (0.4 mg total) under the tongue every 5 (five) minutes as needed for chest pain.  Patient not taking: Reported on 12/28/2021   Verta Ellen., NP Not Taking Active   omeprazole (PRILOSEC) 40 MG capsule [id number] No TAKE 1 CAPSULE BY MOUTH ONCE DAILY 30 MINUTES BEFORE A MEAL Gottschalk, [place], DO Taking Active   OneTouch Delica Lancets 54S MISC [id number] No Use to test blood sugar twice daily as directed; DX: E11.69 Janora Norlander, DO Taking Active Self  OneTouch Delica Lancets 00F MISC [id number] No Check blood sugar BID and prn  E11.9 Ronnie Doss M, DO Taking Active Self  rosuvastatin (CRESTOR) 20 MG tablet [id number] No TAKE 1 TABLET BY MOUTH AT BEDTIME Ronnie Doss M, DO Taking Active   Semaglutide (RYBELSUS) 7 MG TABS [id number] No Take 14 mg by mouth daily. [provider] Taking  Active            Med Note Blanca Friend, Royce Macadamia   Tue Sep 22, 2021  3:08 PM) VIA NOVO NORDISK PATIENT ASSISTANCE PROGRAM  telmisartan (MICARDIS) 40 MG tablet [id number] No Take 1 tablet (40 mg total) by mouth daily. Janora Norlander, DO Taking Active             Patient Active Problem List   Diagnosis Date Noted   Unstable angina ([place]) 06/18/2020   Low back pain 04/11/2019   Moderate nonproliferative diabetic retinopathy of both eyes without macular edema associated with type 2 diabetes mellitus ([place]) 01/05/2019   Hypertension associated with diabetes ([place]) 09/11/2015   Spinal stenosis, lumbar region, with neurogenic claudication 04/25/2015   Arthritis associated with diabetes ([place]) 08/28/2014   Peripheral edema 08/28/2014   Diabetic neuropathy ([place]) 02/18/2014   External bleeding hemorrhoids 04/10/2013   Morbid (severe) obesity due to excess calories ([place]) 04/09/2013   Diabetes ([place]) 04/09/2013   Thrombocytopenia, unspecified ([place]) 04/09/2013   Chest pain 04/09/2013   OSA (obstructive sleep apnea) 07/06/2011   DOE (dyspnea on exertion) 06/06/2011   Coronary artery disease 02/03/2011   GERD (gastroesophageal reflux disease) 02/03/2011   Hyperlipidemia associated with type 2 diabetes mellitus ([place]) 02/03/2011   Noncompliance 02/03/2011   Generalized anxiety disorder 02/03/2011   Insomnia 02/03/2011   Cigarette smoker 02/03/2011   Dyslipidemia due to type 2 diabetes mellitus ([place]) 02/03/2011   Tobacco user 02/03/2011    Immunization History  Administered Date(s) Administered   Influenza,inj,Quad PF,6+ Mos 08/10/2013, 08/28/2014, 09/25/2015, 11/23/2017, 11/27/2018   Pneumococcal Conjugate-13 11/27/2018    Conditions to be addressed/monitored: HTN, HLD, and DMII  Care Plan : PHARMD-MEDICATION MANAGEMENT  Updates made by Lavera Guise, RPH since 01/06/2022 12:00 AM     Problem: DISEASE PROGRESSION PREVENTION      Long-Range Goal: T2DM   Recent Progress: Not on track   Priority: High  Note:   Current Barriers:  Unable to independently afford treatment regimen Unable to achieve control of T2DM  Suboptimal therapeutic regimen for T2DM  Pharmacist Clinical Goal(s):  Over the next 90 days, patient will verbalize ability to afford treatment regimen  achieve adherence to monitoring guidelines and medication adherence to achieve therapeutic efficacy achieve control of T2DM as evidenced by GOAL A1C  through collaboration with PharmD and provider.   Interventions: 1:1 collaboration with Janora Norlander, DO regarding development and update of comprehensive plan of care as evidenced by provider attestation and co-signature Inter-disciplinary care team collaboration (see longitudinal plan of care) Comprehensive medication review performed; medication list updated in electronic medical record  Diabetes: Uncontrolled--A1C 8.9%, GFR 44; current treatment: BASAGLAR 40 units, METFORMIN, increase RYBELSUS to 90m Patient to have a1c checked next week, increasing dose for glycemic control (decrease in insulin) & hopeful weight loss Insulin requirements have increased Patient gets via [employer] patient assistnace program Increasing rybelsus to 178mdaily as tolerated Denies personal and family history of Medullary thyroid cancer ([place]Patient enrolled in novo nordisk patient assistance program Additional samples given to patient  Current glucose readings: fasting glucose: <160, post prandial glucose: <200 Denies hypoglycemic/hyperglycemic symptoms Current meal patterns: Discussed meal planning options and Plate Method for healthy eating Avoid sugary drinks and desserts Incorporate balanced protein, non starchy veggies, 1 serving of carbohydrate with each meal Increase water intake Increase physical activity as able Current exercise: unable due to pain; difficulty getting around; uses cane to walk Educated on medications; lifestyle modifications Recommended  improve diet Financial-re-enrolled patient in [employer] cares [employer] patient assistance for [employer] novo nordisk PAP for Rybelsus FOR 2023  Hypertension -patient reports BP control is better (briefly discussed), compliant with medicine  Hyperlipidemia -LDL <70 at goal, continue current medication (crestor) Lipid Panel     Component Value Date/Time   CHOL 91 (L) 08/08/2020 1035   TRIG 134 08/08/2020 1035   TRIG 211 (H) 12/30/2014 1537   HDL 36 (L) 08/08/2020 1035   HDL 48 12/30/2014 1537   CHOLHDL 2.5 08/08/2020 1035   CHOLHDL 4.5 04/09/2013 1135   VLDL 37 04/09/2013 1135   LDLCALC 31 08/08/2020 1035   LDLCALC 42 08/28/2014 1037   LABVLDL 24 08/08/2020 1035    Patient Goals/Self-Care Activities Over the next 90 days, patient will:  - take medications as prescribed focus on medication adherence by REDUCING A1C TO GOAL <7% check glucose DAILY (FASTING), document, and provide at future appointments  Follow Up Plan: Telephone follow up appointment with care management team member scheduled for: 2 months      Medication Assistance:  l[employer] cares (basaglar); novo nordisk (rybelsus)   Follow Up:  Patient agrees to Care Plan and Follow-up.  Plan: Telephone follow up appointment with care management team member scheduled for:  next week    JuRegina EckPharmD, BCPS Clinical Pharmacist, [place] Phone [phone]

## 2022-01-06 NOTE — Patient Instructions (Signed)
Visit Information  Following are the goals we discussed today:  Current Barriers:  Unable to independently afford treatment regimen Unable to achieve control of T2DM  Suboptimal therapeutic regimen for T2DM  Pharmacist Clinical Goal(s):  Over the next 90 days, patient will verbalize ability to afford treatment regimen achieve adherence to monitoring guidelines and medication adherence to achieve therapeutic efficacy achieve control of T2DM as evidenced by GOAL A1C through collaboration with PharmD and provider.   Interventions: 1:1 collaboration with Janora Norlander, DO regarding development and update of comprehensive plan of care as evidenced by provider attestation and co-signature Inter-disciplinary care team collaboration (see longitudinal plan of care) Comprehensive medication review performed; medication list updated in electronic medical record  Diabetes: Uncontrolled--A1C 8.9%, GFR 44; current treatment: BASAGLAR 40 units, METFORMIN, increase RYBELSUS to 14mg  Patient to have a1c checked next week, increasing dose for glycemic control (decrease in insulin) & hopeful weight loss Insulin requirements have increased Patient gets via [employer] patient assistnace program Increasing rybelsus to 14mg  daily as tolerated Denies personal and family history of Medullary thyroid cancer ([place]) Patient enrolled in novo nordisk patient assistance program Additional samples given to patient  Current glucose readings: fasting glucose: <160, post prandial glucose: <200 Denies hypoglycemic/hyperglycemic symptoms Current meal patterns: Discussed meal planning options and Plate Method for healthy eating Avoid sugary drinks and desserts Incorporate balanced protein, non starchy veggies, 1 serving of carbohydrate with each meal Increase water intake Increase physical activity as able Current exercise: unable due to pain; difficulty getting around; uses cane to walk Educated on medications;  lifestyle modifications Recommended improve diet Financial-re-enrolled patient in [employer] patient assistance for [employer] and novo nordisk PAP for Rybelsus FOR 2023  Hypertension -patient reports BP control is better (briefly discussed), compliant with medicine  Hyperlipidemia -LDL <70 at goal, continue current medication (crestor) Lipid Panel     Component Value Date/Time   CHOL 91 (L) 08/08/2020 1035   TRIG 134 08/08/2020 1035   TRIG 211 (H) 12/30/2014 1537   HDL 36 (L) 08/08/2020 1035   HDL 48 12/30/2014 1537   CHOLHDL 2.5 08/08/2020 1035   CHOLHDL 4.5 04/09/2013 1135   VLDL 37 04/09/2013 1135   LDLCALC 31 08/08/2020 1035   LDLCALC 42 08/28/2014 1037   LABVLDL 24 08/08/2020 1035     Patient Goals/Self-Care Activities Over the next 90 days, patient will:  - take medications as prescribed focus on medication adherence by REDUCING A1C TO GOAL <7% check glucose DAILY (FASTING), document, and provide at future appointments  Follow Up Plan: Telephone follow up appointment with care management team member scheduled for: 2 months   Plan: Telephone follow up appointment with care management team member scheduled for:  3 months  Signature Regina Eck, PharmD, BCPS Clinical Pharmacist, [place]  II Phone [phone]  Please call the care guide team at [phone] if you need to cancel or reschedule your appointment.   The patient verbalized understanding of instructions, educational materials, and care plan provided today and agreed to receive a mailed copy of patient instructions, educational materials, and care plan.

## 2022-01-12 ENCOUNTER — Ambulatory Visit (INDEPENDENT_AMBULATORY_CARE_PROVIDER_SITE_OTHER): Payer: HMO | Admitting: Family Medicine

## 2022-01-12 VITALS — BP 114/65 | HR 82 | Temp 96.9°F | Ht 62.0 in | Wt 293.4 lb

## 2022-01-12 DIAGNOSIS — I152 Hypertension secondary to endocrine disorders: Secondary | ICD-10-CM | POA: Diagnosis not present

## 2022-01-12 DIAGNOSIS — F411 Generalized anxiety disorder: Secondary | ICD-10-CM

## 2022-01-12 DIAGNOSIS — F32A Depression, unspecified: Secondary | ICD-10-CM

## 2022-01-12 DIAGNOSIS — Z794 Long term (current) use of insulin: Secondary | ICD-10-CM | POA: Diagnosis not present

## 2022-01-12 DIAGNOSIS — E0843 Diabetes mellitus due to underlying condition with diabetic autonomic (poly)neuropathy: Secondary | ICD-10-CM | POA: Diagnosis not present

## 2022-01-12 DIAGNOSIS — E785 Hyperlipidemia, unspecified: Secondary | ICD-10-CM

## 2022-01-12 DIAGNOSIS — E1169 Type 2 diabetes mellitus with other specified complication: Secondary | ICD-10-CM

## 2022-01-12 DIAGNOSIS — E1165 Type 2 diabetes mellitus with hyperglycemia: Secondary | ICD-10-CM | POA: Diagnosis not present

## 2022-01-12 DIAGNOSIS — E1159 Type 2 diabetes mellitus with other circulatory complications: Secondary | ICD-10-CM

## 2022-01-12 LAB — BAYER DCA HB A1C WAIVED: HB A1C (BAYER DCA - WAIVED): 6.6 % — ABNORMAL HIGH (ref 4.8–5.6)

## 2022-01-12 MED ORDER — SERTRALINE HCL 50 MG PO TABS: 50.0000 mg | ORAL_TABLET | Freq: Every day | ORAL | 0 refills | Status: DC

## 2022-01-12 NOTE — Patient Instructions (Signed)
You were on Cymbalta before so I have sent in a different class of medication.  Start Zoloft daily. ?

## 2022-01-12 NOTE — Progress Notes (Signed)
Subjective: CC:DM PCP: Janora Norlander, DO HOZ:YYQMG RATASHA FABRE is a 61 y.o. female presenting to clinic today for:  1. Type 2 Diabetes with hypertension, hyperlipidemia:  Compliant with all medications.  Rybelsus 14 mg was recently added last week.  She is tolerating this without difficulty.  Compliant with all other medications.  No hypoglycemic episodes reported.  No GI issues reported.  No chest pain or shortness of breath reported  Last eye exam: needs Last foot exam: needs Last A1c:  Lab Results  Component Value Date   HGBA1C 8.9 (H) 06/12/2021   Nephropathy screen indicated?: UTD Last flu, zoster and/or pneumovax:  Shingles Immunization History  Administered Date(s) Administered   Influenza,inj,Quad PF,6+ Mos 08/10/2013, 08/28/2014, 09/25/2015, 11/23/2017, 11/27/2018   Pneumococcal Conjugate-13 11/27/2018   2.  Anxiety and depression Continues to struggle with anxiety and depression.  She was previously on Cymbalta as she does suffer from chronic pain as well but this caused some stomach upset so it was discontinued.  She would be glad to visit a new medication  ROS: Per HPI  Allergies  Allergen Reactions   Duloxetine    Farxiga [Dapagliflozin]     RECURRENT YEAST INFECTIONS   Morphine And Related Nausea And Vomiting   Past Medical History:  Diagnosis Date   Arthritis    CAD (coronary artery disease)    BMS to circumflex 2007 - Dr. Olevia Perches   CKD (chronic kidney disease) stage 3, GFR 30-59 ml/min (HCC)    Essential hypertension    Falls frequently    GERD (gastroesophageal reflux disease)    HA (headache)    Hyperlipidemia    Left-sided face pain    Morbid obesity ([place])    Noncompliance    OSA (obstructive sleep apnea)    Uses CPAP   Type 2 diabetes mellitus (HCC)    Urinary incontinence     Current Outpatient Medications:    acetaminophen (TYLENOL) 325 MG tablet, Take 975 mg by mouth every 8 (eight) hours as needed., Disp: , Rfl:    albuterol  (VENTOLIN HFA) 108 (90 Base) MCG/ACT inhaler, Inhale 2 puffs into the lungs every 6 (six) hours as needed for wheezing or shortness of breath (for rescue)., Disp: 1 each, Rfl: 0   Alpha-Lipoic Acid 600 MG CAPS, Take 1 capsule (600 mg total) by mouth daily. For diabetic neuropathy, Disp: 100 capsule, Rfl: 3   aspirin EC 81 MG tablet, Take 1 tablet (81 mg total) by mouth daily., Disp: , Rfl:    Blood Glucose Monitoring Suppl (ONETOUCH VERIO) w/Device KIT, Use to test blood sugar twice daily as directed; DX: E11.69, Disp: 1 kit, Rfl: 1   diclofenac sodium (VOLTAREN) 1 % GEL, Apply 4 g topically 4 (four) times daily., Disp: 400 g, Rfl: 2   furosemide (LASIX) 40 MG tablet, TAKE 1 TABLET BY MOUTH ONCE DAILY AS NEEDED FOR EDEMA OR FLUID, Disp: 90 tablet, Rfl: 0   gabapentin (NEURONTIN) 600 MG tablet, Take 1.5 tablets (900 mg total) by mouth 3 (three) times daily., Disp: 270 tablet, Rfl: 3   glimepiride (AMARYL) 2 MG tablet, Take 2 mg by mouth daily., Disp: , Rfl:    glucose blood (ONETOUCH VERIO) test strip, Use to test blood sugar twice daily as directed; DX: E11.69, Disp: 100 each, Rfl: 5   Insulin Glargine (BASAGLAR KWIKPEN) 100 UNIT/ML, Inject 60 Units into the skin at bedtime. (Patient taking differently: Inject 40 Units into the skin at bedtime.), Disp: 45 mL, Rfl: 12  metFORMIN (GLUCOPHAGE-XR) 500 MG 24 hr tablet, TAKE 2 TABLETS BY MOUTH ONCE DAILY WITH BREAKFAST, Disp: 60 tablet, Rfl: 0   metoprolol tartrate (LOPRESSOR) 25 MG tablet, Take 1 tablet by mouth twice daily, Disp: 180 tablet, Rfl: 0   nitroGLYCERIN (NITROSTAT) 0.4 MG SL tablet, Place 1 tablet (0.4 mg total) under the tongue every 5 (five) minutes as needed for chest pain., Disp: 25 tablet, Rfl: 3   omeprazole (PRILOSEC) 40 MG capsule, TAKE 1 CAPSULE BY MOUTH ONCE DAILY 30 MINUTES BEFORE A MEAL, Disp: 90 capsule, Rfl: 0   OneTouch Delica Lancets 81E MISC, Use to test blood sugar twice daily as directed; DX: E11.69, Disp: 100 each, Rfl: 3    OneTouch Delica Lancets 75T MISC, Check blood sugar BID and prn  E11.9, Disp: 100 each, Rfl: 11   rosuvastatin (CRESTOR) 20 MG tablet, TAKE 1 TABLET BY MOUTH AT BEDTIME, Disp: 90 tablet, Rfl: 0   Semaglutide (RYBELSUS) 7 MG TABS, Take 14 mg by mouth daily., Disp: , Rfl:    telmisartan (MICARDIS) 40 MG tablet, Take 1 tablet (40 mg total) by mouth daily., Disp: 90 tablet, Rfl: 1 Social History   Socioeconomic History   Marital status: Married    Spouse name: Gwyndolyn Saxon   Number of children: 5   Years of education: 10 th   Highest education level: 10th grade  Occupational History   Occupation: disabled     Comment: disabled  Tobacco Use   Smoking status: Every Day    Packs/day: 1.00    Years: 47.00    Pack years: 47.00    Types: Cigarettes    Start date: 05/14/1972   Smokeless tobacco: Never  Vaping Use   Vaping Use: Never used  Substance and Sexual Activity   Alcohol use: No    Alcohol/week: 0.0 standard drinks   Drug use: No   Sexual activity: Not Currently    Birth control/protection: Surgical  Other Topics Concern   Not on file  Social History Narrative   Patient lives at home with her husband. Patient is disabled.   Children live nearby   Patient has 10 th grade education.   Right handed.   Caffeine- one  cup of coffee and  One soda Dr.Pepper/ tea. daily   Social Determinants of Health   Financial Resource Strain: High Risk   Difficulty of Paying Living Expenses: Very hard  Food Insecurity: No Food Insecurity   Worried About Charity fundraiser in the Last Year: Never true   Ran Out of Food in the Last Year: Never true  Transportation Needs: Unmet Transportation Needs   Lack of Transportation (Medical): Yes   Lack of Transportation (Non-Medical): No  Physical Activity: Inactive   Days of Exercise per Week: 0 days   Minutes of Exercise per Session: 0 min  Stress: Stress Concern Present   Feeling of Stress : To some extent  Social Connections: Moderately Integrated    Frequency of Communication with Friends and Family: More than three times a week   Frequency of Social Gatherings with Friends and Family: Three times a week   Attends Religious Services: Never   Active Member of Clubs or Organizations: No   Attends Music therapist: 1 to 4 times per year   Marital Status: Married  Human resources officer Violence: Not At Risk   Fear of Current or Ex-Partner: No   Emotionally Abused: No   Physically Abused: No   Sexually Abused: No   Family History  Problem Relation Age of Onset   Breast cancer Mother    Cancer Father    Coronary artery disease Father    Cancer - Colon Father    Diabetes Father    High Cholesterol Father    Arthritis Sister    Asthma Sister    High Cholesterol Daughter    Thyroid disease Daughter    Hiatal hernia Son    Congenital heart disease Son     Objective: Office vital signs reviewed. BP 114/65    Pulse 82    Temp (!) 96.9 F (36.1 C) (Temporal)    Ht '5\' 2"'  (1.575 m)    Wt 293 lb 6.4 oz (133.1 kg)    SpO2 96%    BMI 53.66 kg/m   Physical Examination:  General: Awake, alert, morbidly obese, No acute distress Cardio: regular rate and rhythm, S1S2 heard, no murmurs appreciated Pulm: clear to auscultation bilaterally, no wheezes, rhonchi or rales; normal work of breathing on room air Extremities: Bilateral lower extremity lymphedema present. Psych: Mood depressed  Diabetic Foot Exam - Simple   Simple Foot Form Diabetic Foot exam was performed with the following findings: Yes 01/12/2022  3:17 PM  Visual Inspection See comments: Yes Sensation Testing See comments: Yes Pulse Check See comments: Yes Comments +1 pedal pulses with decreased monofilament sensation throughout.  She is able to feel it but it is extremely light.  She has lymph edema of the legs bilaterally    Depression screen [facility] 2/9 12/28/2021 06/12/2021 02/13/2021  Decreased Interest 2 3 0  Down, Depressed, Hopeless 2 3 0  PHQ - 2 Score 4 6 0   Altered sleeping 0 3 3  Tired, decreased energy '2 3 3  ' Change in appetite 1 2 0  Feeling bad or failure about yourself  0 3 2  Trouble concentrating 1 2 0  Moving slowly or fidgety/restless 0 0 3  Suicidal thoughts 0 0 0  PHQ-9 Score '8 19 11  ' Difficult doing work/chores Somewhat difficult Very difficult Not difficult at all  Some recent data might be hidden   GAD 7 : Generalized Anxiety Score 06/12/2021 03/03/2020  Nervous, Anxious, on Edge 3 1  Control/stop worrying 2 1  Worry too much - different things 2 3  Trouble relaxing 2 3  Restless 0 0  Easily annoyed or irritable 1 1  Afraid - awful might happen 3 1  Total GAD 7 Score 13 10  Anxiety Difficulty Very difficult Somewhat difficult     Assessment/ Plan: 60 y.o. female   Controlled type 2 diabetes mellitus with other specified complication, with long-term current use of insulin ([place]) - Plan: Bayer DCA Hb A1c Waived  Hypertension associated with diabetes ([place]) - Plan: CMP14+EGFR  Hyperlipidemia associated with type 2 diabetes mellitus ([place]) - Plan: CMP14+EGFR, LDL Cholesterol, Direct  Diabetic autonomic neuropathy associated with diabetes mellitus due to underlying condition (HCC)  Generalized anxiety disorder - Plan: sertraline (ZOLOFT) 50 MG tablet  Depressive disorder - Plan: sertraline (ZOLOFT) 50 MG tablet  Sugar now controlled with A1c down to 6.6.  She is doing fabulously on the Rybelsus and I would like her to continue this for ongoing weight assistance.  She will get her diabetic eye exam done with her retinal doctor soon.  Diabetic foot exam was updated today  Blood pressure controlled.  No changes  Check direct LDL given nonfasting status.  Continue statin  Anxiety disorder and depression are not totally controlled and I would like to  trial her on Zoloft.  I considered Pristiq but given her intolerance to the Cymbalta I fear that she may have similar results.  We will plan for 90-day supply sent to her mail  order pharmacy if she prefers once we know that she is able to tolerate the med  Orders Placed This Encounter  Procedures   Bayer [place] Hb A1c Waived   CMP14+EGFR   LDL Cholesterol, Direct   No orders of the defined types were placed in this encounter.    Janora Norlander, DO [place] [phone]

## 2022-01-13 LAB — CMP14+EGFR
ALT: 10 IU/L (ref 0–32)
AST: 14 IU/L (ref 0–40)
Albumin/Globulin Ratio: 1.6 (ref 1.2–2.2)
Albumin: 4.2 g/dL (ref 3.8–4.8)
Alkaline Phosphatase: 98 IU/L (ref 44–121)
BUN/Creatinine Ratio: 9 — ABNORMAL LOW (ref 12–28)
BUN: 12 mg/dL (ref 8–27)
Bilirubin Total: 0.3 mg/dL (ref 0.0–1.2)
CO2: 23 mmol/L (ref 20–29)
Calcium: 9.4 mg/dL (ref 8.7–10.3)
Chloride: 99 mmol/L (ref 96–106)
Creatinine, Ser: 1.3 mg/dL — ABNORMAL HIGH (ref 0.57–1.00)
Globulin, Total: 2.7 g/dL (ref 1.5–4.5)
Glucose: 79 mg/dL (ref 70–99)
Potassium: 4 mmol/L (ref 3.5–5.2)
Sodium: 138 mmol/L (ref 134–144)
Total Protein: 6.9 g/dL (ref 6.0–8.5)
eGFR: 46 mL/min/{1.73_m2} — ABNORMAL LOW (ref >59)

## 2022-01-13 LAB — LDL CHOLESTEROL, DIRECT: LDL Direct: 52 mg/dL (ref 0–99)

## 2022-02-01 ENCOUNTER — Other Ambulatory Visit: Payer: Self-pay

## 2022-02-01 ENCOUNTER — Ambulatory Visit
Admission: RE | Admit: 2022-02-01 | Discharge: 2022-02-01 | Disposition: A | Discharge status: Home / Self Care | Payer: HMO | Source: Ambulatory Visit | Attending: Family Medicine | Admitting: Family Medicine

## 2022-02-01 DIAGNOSIS — Z1231 Encounter for screening mammogram for malignant neoplasm of breast: Secondary | ICD-10-CM

## 2022-02-06 ENCOUNTER — Other Ambulatory Visit: Payer: Self-pay | Admitting: Family Medicine

## 2022-02-10 ENCOUNTER — Ambulatory Visit (INDEPENDENT_AMBULATORY_CARE_PROVIDER_SITE_OTHER): Payer: HMO | Admitting: Family Medicine

## 2022-02-10 ENCOUNTER — Encounter: Payer: Self-pay | Admitting: Family Medicine

## 2022-02-10 DIAGNOSIS — F32A Depression, unspecified: Secondary | ICD-10-CM

## 2022-02-10 DIAGNOSIS — F411 Generalized anxiety disorder: Secondary | ICD-10-CM | POA: Diagnosis not present

## 2022-02-10 DIAGNOSIS — G894 Chronic pain syndrome: Secondary | ICD-10-CM

## 2022-02-10 NOTE — Progress Notes (Signed)
Telephone visit ? ?Subjective: ?CC: Follow-up mood ?PCP: Janora Norlander, DO ?HQP:RFFMB [name] is a 61 y.o. female calls for telephone consult today. Patient provides verbal consent for consult held via phone. ? ?Due to COVID-19 pandemic this visit was conducted virtually. This visit type was conducted due to national recommendations for restrictions regarding the COVID-19 Pandemic (e.g. social distancing, sheltering in place) in an effort to limit this patient's exposure and mitigate transmission in our community. All issues noted in this document were discussed and addressed.  A physical exam was not performed with this format.  ? ?Location of patient: home ?Location of provider: [facility] ?Others present for call: none ? ?1.  Depression and anxiety ?Patient was started on Zoloft 50 mg a month ago.  She admits that she has not even taken the first tablet because she "was afraid to".  She did not want to feel like a zombie.  She continues to suffer with anxiety and depression and in fact reports that her chronic pain seems to be worse as of late as well.  She has been taking Tylenol in efforts to improve it.  She suffers from renal disease so cannot take oral NSAIDs.  She however does not feel like it is bad enough that she would want to go on prednisone. ? ? ?ROS: Per HPI ? ?Allergies  ?Allergen Reactions  ? Duloxetine   ? Wilder Glade [Dapagliflozin]   ?  RECURRENT YEAST INFECTIONS  ? Morphine And Related Nausea And Vomiting  ? ?Past Medical History:  ?Diagnosis Date  ? Arthritis   ? CAD (coronary artery disease)   ? BMS to circumflex 2007 - Dr. Olevia Perches  ? CKD (chronic kidney disease) stage 3, GFR 30-59 ml/min (HCC)   ? Essential hypertension   ? Falls frequently   ? GERD (gastroesophageal reflux disease)   ? HA (headache)   ? Hyperlipidemia   ? Left-sided face pain   ? Morbid obesity ([place])   ? Noncompliance   ? OSA (obstructive sleep apnea)   ? Uses CPAP  ? Type 2 diabetes mellitus ([place])   ? Urinary incontinence    ? ? ?Current Outpatient Medications:  ?  acetaminophen (TYLENOL) 325 MG tablet, Take 975 mg by mouth every 8 (eight) hours as needed., Disp: , Rfl:  ?  albuterol (VENTOLIN HFA) 108 (90 Base) MCG/ACT inhaler, Inhale 2 puffs into the lungs every 6 (six) hours as needed for wheezing or shortness of breath (for rescue)., Disp: 1 each, Rfl: 0 ?  Alpha-Lipoic Acid 600 MG CAPS, Take 1 capsule (600 mg total) by mouth daily. For diabetic neuropathy, Disp: 100 capsule, Rfl: 3 ?  aspirin EC 81 MG tablet, Take 1 tablet (81 mg total) by mouth daily., Disp: , Rfl:  ?  Blood Glucose Monitoring Suppl (ONETOUCH VERIO) w/Device KIT, Use to test blood sugar twice daily as directed; DX: E11.69, Disp: 1 kit, Rfl: 1 ?  diclofenac sodium (VOLTAREN) 1 % GEL, Apply 4 g topically 4 (four) times daily., Disp: 400 g, Rfl: 2 ?  furosemide (LASIX) 40 MG tablet, TAKE 1 TABLET BY MOUTH ONCE DAILY AS NEEDED FOR EDEMA OR FLUID, Disp: 90 tablet, Rfl: 0 ?  gabapentin (NEURONTIN) 600 MG tablet, Take 1.5 tablets (900 mg total) by mouth 3 (three) times daily., Disp: 270 tablet, Rfl: 3 ?  glimepiride (AMARYL) 2 MG tablet, Take 2 mg by mouth daily., Disp: , Rfl:  ?  glucose blood (ONETOUCH VERIO) test strip, Use to test blood sugar twice  daily as directed; DX: E11.69, Disp: 100 each, Rfl: 5 ?  Insulin Glargine (BASAGLAR KWIKPEN) 100 UNIT/ML, Inject 60 Units into the skin at bedtime. (Patient taking differently: Inject 40 Units into the skin at bedtime.), Disp: 45 mL, Rfl: 12 ?  metFORMIN (GLUCOPHAGE-XR) 500 MG 24 hr tablet, TAKE 2 TABLETS BY MOUTH ONCE DAILY WITH BREAKFAST, Disp: 60 tablet, Rfl: 0 ?  metoprolol tartrate (LOPRESSOR) 25 MG tablet, Take 1 tablet by mouth twice daily, Disp: 180 tablet, Rfl: 0 ?  nitroGLYCERIN (NITROSTAT) 0.4 MG SL tablet, Place 1 tablet (0.4 mg total) under the tongue every 5 (five) minutes as needed for chest pain., Disp: 25 tablet, Rfl: 3 ?  omeprazole (PRILOSEC) 40 MG capsule, TAKE 1 CAPSULE BY MOUTH ONCE DAILY 30 MINUTES  BEFORE A MEAL, Disp: 90 capsule, Rfl: 0 ?  OneTouch Delica Lancets 14D MISC, Use to test blood sugar twice daily as directed; DX: E11.69, Disp: 100 each, Rfl: 3 ?  OneTouch Delica Lancets 03U MISC, Check blood sugar BID and prn  E11.9, Disp: 100 each, Rfl: 11 ?  rosuvastatin (CRESTOR) 20 MG tablet, TAKE 1 TABLET BY MOUTH AT BEDTIME, Disp: 90 tablet, Rfl: 0 ?  Semaglutide (RYBELSUS) 7 MG TABS, Take 14 mg by mouth daily., Disp: , Rfl:  ?  sertraline (ZOLOFT) 50 MG tablet, Take 1 tablet (50 mg total) by mouth daily. Daily for depression/ anxiety, Disp: 30 tablet, Rfl: 0 ?  telmisartan (MICARDIS) 40 MG tablet, Take 1 tablet (40 mg total) by mouth daily., Disp: 90 tablet, Rfl: 1 ? ?Assessment/ Plan: ?61 y.o. female  ? ?Generalized anxiety disorder ? ?Depressive disorder ? ?Chronic pain syndrome ? ?Anxiety depression and chronic pain all remain uncontrolled.  We had a frank conversation today about the impact of mental health on physical health.  I have encouraged her to trial the Zoloft.  She is can to contact me on Monday and let me know how she felt on it and if she is having any significant adverse side effects we can certainly trial other medications.  I did offer prednisone Dosepak if needed given her blood sugar being under excellent control during her last visit but she did not want to have any prednisone for treatment of pain just yet.  She will contact me if any concerns arise prior to Monday ? ?Start time: 1:00pm ?End time: 1:05pm ? ?Total time spent on patient care (including telephone call/ virtual visit): 5 minutes ? ?Janora Norlander, DO ?[place] ?[phone] ? ?

## 2022-02-13 ENCOUNTER — Other Ambulatory Visit: Payer: Self-pay | Admitting: Family Medicine

## 2022-02-13 DIAGNOSIS — E1159 Type 2 diabetes mellitus with other circulatory complications: Secondary | ICD-10-CM

## 2022-02-13 DIAGNOSIS — I152 Hypertension secondary to endocrine disorders: Secondary | ICD-10-CM

## 2022-03-03 ENCOUNTER — Telehealth: Payer: Self-pay | Admitting: Family Medicine

## 2022-03-03 NOTE — Telephone Encounter (Signed)
Pt aware.

## 2022-03-03 NOTE — Telephone Encounter (Signed)
Please let patient know: ? ?Rybelsus is up front for pick up ?#4 boxes 7 mg ?

## 2022-03-04 DIAGNOSIS — I209 Angina pectoris, unspecified: Secondary | ICD-10-CM | POA: Diagnosis not present

## 2022-03-04 DIAGNOSIS — F331 Major depressive disorder, recurrent, moderate: Secondary | ICD-10-CM | POA: Diagnosis not present

## 2022-03-04 DIAGNOSIS — E1169 Type 2 diabetes mellitus with other specified complication: Secondary | ICD-10-CM | POA: Diagnosis not present

## 2022-03-04 DIAGNOSIS — E261 Secondary hyperaldosteronism: Secondary | ICD-10-CM | POA: Diagnosis not present

## 2022-03-04 DIAGNOSIS — E1165 Type 2 diabetes mellitus with hyperglycemia: Secondary | ICD-10-CM | POA: Diagnosis not present

## 2022-03-04 DIAGNOSIS — N183 Chronic kidney disease, stage 3 unspecified: Secondary | ICD-10-CM | POA: Diagnosis not present

## 2022-03-04 DIAGNOSIS — I110 Hypertensive heart disease with heart failure: Secondary | ICD-10-CM | POA: Diagnosis not present

## 2022-03-04 DIAGNOSIS — Z794 Long term (current) use of insulin: Secondary | ICD-10-CM | POA: Diagnosis not present

## 2022-03-04 DIAGNOSIS — E1142 Type 2 diabetes mellitus with diabetic polyneuropathy: Secondary | ICD-10-CM | POA: Diagnosis not present

## 2022-03-04 DIAGNOSIS — E1122 Type 2 diabetes mellitus with diabetic chronic kidney disease: Secondary | ICD-10-CM | POA: Diagnosis not present

## 2022-03-04 DIAGNOSIS — J42 Unspecified chronic bronchitis: Secondary | ICD-10-CM | POA: Diagnosis not present

## 2022-03-09 ENCOUNTER — Other Ambulatory Visit: Payer: Self-pay | Admitting: Family Medicine

## 2022-03-09 DIAGNOSIS — E1159 Type 2 diabetes mellitus with other circulatory complications: Secondary | ICD-10-CM

## 2022-03-09 DIAGNOSIS — K219 Gastro-esophageal reflux disease without esophagitis: Secondary | ICD-10-CM

## 2022-03-09 DIAGNOSIS — E1169 Type 2 diabetes mellitus with other specified complication: Secondary | ICD-10-CM

## 2022-04-06 ENCOUNTER — Encounter: Payer: Self-pay | Admitting: Family Medicine

## 2022-04-06 ENCOUNTER — Ambulatory Visit (INDEPENDENT_AMBULATORY_CARE_PROVIDER_SITE_OTHER): Payer: HMO

## 2022-04-06 ENCOUNTER — Ambulatory Visit (INDEPENDENT_AMBULATORY_CARE_PROVIDER_SITE_OTHER): Payer: HMO | Admitting: Family Medicine

## 2022-04-06 VITALS — BP 99/61 | HR 108 | Temp 100.2°F | Ht 62.0 in | Wt 283.6 lb

## 2022-04-06 DIAGNOSIS — J449 Chronic obstructive pulmonary disease, unspecified: Secondary | ICD-10-CM | POA: Diagnosis not present

## 2022-04-06 DIAGNOSIS — R0602 Shortness of breath: Secondary | ICD-10-CM

## 2022-04-06 DIAGNOSIS — R509 Fever, unspecified: Secondary | ICD-10-CM

## 2022-04-06 DIAGNOSIS — J42 Unspecified chronic bronchitis: Secondary | ICD-10-CM

## 2022-04-06 DIAGNOSIS — R059 Cough, unspecified: Secondary | ICD-10-CM | POA: Diagnosis not present

## 2022-04-06 DIAGNOSIS — R0600 Dyspnea, unspecified: Secondary | ICD-10-CM | POA: Diagnosis not present

## 2022-04-06 MED ORDER — AMOXICILLIN-POT CLAVULANATE 875-125 MG PO TABS: 1.0000 | ORAL_TABLET | Freq: Two times a day (BID) | ORAL | 0 refills | Status: DC

## 2022-04-06 MED ORDER — ALBUTEROL SULFATE HFA 108 (90 BASE) MCG/ACT IN AERS
2.0000 | INHALATION_SPRAY | RESPIRATORY_TRACT | 11 refills | Status: DC | PRN
Start: 2022-04-06 — End: 2023-03-29

## 2022-04-06 MED ORDER — PREDNISONE 10 MG PO TABS: ORAL_TABLET | ORAL | 0 refills | Status: DC

## 2022-04-06 NOTE — Progress Notes (Addendum)
Chief Complaint  Patient presents with   Abdominal Pain   Chills   Cough   Nasal Congestion   Shortness of Breath   Fever    HPI  Patient presents today for Patient presents with upper respiratory congestion. Rhinorrhea that is frequently purulent. There is moderate sore throat. Patient reports coughing frequently as well.  Green sputum noted. There is  fever with cold chills and sweats. The patient reports being short of breath walking about 10-15 feet.. Onset was 4 days ago. Gradually worsening.  Legs feel weak and shaky and her hands have been going numb.  She feels spasms in her legs and feet since she started getting sick.  PMH: Smoking status noted ROS: Per HPI  Objective: BP 99/61   Pulse (!) 108   Temp 100.2 F (37.9 C)   Ht 5\' 2"  (1.575 m)   Wt 283 lb 9.6 oz (128.6 kg)   SpO2 94%   BMI 51.87 kg/m  Gen: mild distress, alert, cooperative with exam HEENT: NCAT, Nasal passages swollen, TMs clear Resp: Bronchitis changes with scattered wheezes, non-labored Ext: No edema, warm Neuro: Alert and oriented, No gross deficits  Assessment and plan:  1. Shortness of breath   2. Fever, unspecified fever cause   3. Chronic bronchitis, unspecified chronic bronchitis type (HCC)     Meds ordered this encounter  Medications   amoxicillin-clavulanate (AUGMENTIN) 875-125 MG tablet    Sig: Take 1 tablet by mouth 2 (two) times daily. Take all of this medication    Dispense:  20 tablet    Refill:  0   predniSONE (DELTASONE) 10 MG tablet    Sig: Take 5 daily for 2 days followed by 4,3,2 and 1 for 2 days each.    Dispense:  30 tablet    Refill:  0   albuterol (VENTOLIN HFA) 108 (90 Base) MCG/ACT inhaler    Sig: Inhale 2 puffs into the lungs every 4 (four) hours as needed for wheezing or shortness of breath (for rescue).    Dispense:  1 each    Refill:  11    Use albuterol 2 puffs every 4 hours while awake until sx clear.   Follow up as needed.  , MD

## 2022-04-06 NOTE — Addendum Note (Signed)
Addended by: Mechele Claude on: 04/06/2022 04:55 PM   Modules accepted: Orders

## 2022-04-07 ENCOUNTER — Other Ambulatory Visit: Payer: Self-pay | Admitting: *Deleted

## 2022-04-07 DIAGNOSIS — N289 Disorder of kidney and ureter, unspecified: Secondary | ICD-10-CM

## 2022-04-07 LAB — CBC WITH DIFFERENTIAL/PLATELET
Basophils Absolute: 0.1 10*3/uL (ref 0.0–0.2)
Basos: 1 %
EOS (ABSOLUTE): 0.1 10*3/uL (ref 0.0–0.4)
Eos: 1 %
Hematocrit: 37.7 % (ref 34.0–46.6)
Hemoglobin: 12.6 g/dL (ref 11.1–15.9)
Immature Grans (Abs): 0.1 10*3/uL (ref 0.0–0.1)
Immature Granulocytes: 1 %
Lymphocytes Absolute: 2.2 10*3/uL (ref 0.7–3.1)
Lymphs: 15 %
MCH: 31 pg (ref 26.6–33.0)
MCHC: 33.4 g/dL (ref 31.5–35.7)
MCV: 93 fL (ref 79–97)
Monocytes Absolute: 1 10*3/uL — ABNORMAL HIGH (ref 0.1–0.9)
Monocytes: 7 %
Neutrophils Absolute: 11.5 10*3/uL — ABNORMAL HIGH (ref 1.4–7.0)
Neutrophils: 75 %
RBC: 4.06 x10E6/uL (ref 3.77–5.28)
RDW: 15.4 % (ref 11.7–15.4)
WBC: 15 10*3/uL — ABNORMAL HIGH (ref 3.4–10.8)

## 2022-04-07 LAB — HEPB+HEPC+HIV PANEL
HIV Screen 4th Generation wRfx: NONREACTIVE
Hep B C IgM: NEGATIVE
Hep B Core Total Ab: NEGATIVE
Hep B E Ab: NEGATIVE
Hep B E Ag: NEGATIVE
Hep B Surface Ab, Qual: NONREACTIVE
Hep C Virus Ab: NONREACTIVE
Hepatitis B Surface Ag: NEGATIVE

## 2022-04-07 LAB — CMP14+EGFR
ALT: 31 IU/L (ref 0–32)
AST: 45 IU/L — ABNORMAL HIGH (ref 0–40)
Albumin/Globulin Ratio: 1.3 (ref 1.2–2.2)
Albumin: 3.8 g/dL (ref 3.8–4.8)
Alkaline Phosphatase: 192 IU/L — ABNORMAL HIGH (ref 44–121)
BUN/Creatinine Ratio: 8 — ABNORMAL LOW (ref 12–28)
BUN: 14 mg/dL (ref 8–27)
Bilirubin Total: 0.8 mg/dL (ref 0.0–1.2)
CO2: 23 mmol/L (ref 20–29)
Calcium: 8.7 mg/dL (ref 8.7–10.3)
Chloride: 97 mmol/L (ref 96–106)
Creatinine, Ser: 1.82 mg/dL — ABNORMAL HIGH (ref 0.57–1.00)
Globulin, Total: 3 g/dL (ref 1.5–4.5)
Glucose: 139 mg/dL — ABNORMAL HIGH (ref 70–99)
Potassium: 3.5 mmol/L (ref 3.5–5.2)
Sodium: 135 mmol/L (ref 134–144)
Total Protein: 6.8 g/dL (ref 6.0–8.5)
eGFR: 31 mL/min/{1.73_m2} — ABNORMAL LOW (ref >59)

## 2022-04-07 LAB — NOVEL CORONAVIRUS, NAA: SARS-CoV-2, NAA: NOT DETECTED

## 2022-04-29 ENCOUNTER — Telehealth: Payer: HMO

## 2022-05-10 ENCOUNTER — Encounter (HOSPITAL_COMMUNITY): Payer: Self-pay

## 2022-05-10 ENCOUNTER — Emergency Department (HOSPITAL_COMMUNITY)
Admission: EM | Admit: 2022-05-10 | Discharge: 2022-05-10 | Disposition: A | Discharge status: Home / Self Care | Payer: HMO | Attending: Emergency Medicine | Admitting: Emergency Medicine

## 2022-05-10 ENCOUNTER — Other Ambulatory Visit: Payer: Self-pay

## 2022-05-10 ENCOUNTER — Emergency Department (HOSPITAL_COMMUNITY): Payer: HMO

## 2022-05-10 DIAGNOSIS — Z794 Long term (current) use of insulin: Secondary | ICD-10-CM | POA: Insufficient documentation

## 2022-05-10 DIAGNOSIS — E1140 Type 2 diabetes mellitus with diabetic neuropathy, unspecified: Secondary | ICD-10-CM | POA: Insufficient documentation

## 2022-05-10 DIAGNOSIS — M5442 Lumbago with sciatica, left side: Secondary | ICD-10-CM | POA: Insufficient documentation

## 2022-05-10 DIAGNOSIS — Z7982 Long term (current) use of aspirin: Secondary | ICD-10-CM | POA: Insufficient documentation

## 2022-05-10 DIAGNOSIS — Z7984 Long term (current) use of oral hypoglycemic drugs: Secondary | ICD-10-CM | POA: Diagnosis not present

## 2022-05-10 DIAGNOSIS — M545 Low back pain, unspecified: Secondary | ICD-10-CM | POA: Diagnosis not present

## 2022-05-10 MED ORDER — FENTANYL CITRATE PF 50 MCG/ML IJ SOSY
50.0000 ug | PREFILLED_SYRINGE | Freq: Once | INTRAMUSCULAR | Status: AC
  Administered 2022-05-10: 50 ug via INTRAMUSCULAR
  Filled 2022-05-10: qty 1

## 2022-05-10 MED ORDER — PREDNISONE 20 MG PO TABS: 40.0000 mg | ORAL_TABLET | Freq: Every day | ORAL | 0 refills | Status: DC

## 2022-05-10 MED ORDER — LIDOCAINE 5 % EX PTCH
1.0000 | MEDICATED_PATCH | CUTANEOUS | Status: DC
  Administered 2022-05-10: 1 via TRANSDERMAL
  Filled 2022-05-10: qty 1

## 2022-05-10 MED ORDER — LIDOCAINE 5 % EX PTCH: 1.0000 | MEDICATED_PATCH | CUTANEOUS | 0 refills | Status: DC

## 2022-05-10 MED ORDER — HYDROCODONE-ACETAMINOPHEN 5-325 MG PO TABS
2.0000 | ORAL_TABLET | Freq: Once | ORAL | Status: AC
  Administered 2022-05-10: 2 via ORAL
  Filled 2022-05-10: qty 2

## 2022-05-10 MED ORDER — LIDOCAINE 3.5 % EX LOTN
1.0000 | TOPICAL_LOTION | CUTANEOUS | 0 refills | Status: DC | PRN
Start: 2022-05-10 — End: 2023-04-21

## 2022-05-10 NOTE — Discharge Instructions (Signed)
Please follow-up with orthopedics [facility] doctor for further evaluation.  I went over give you steroids in addition to lidocaine patches to go home with.  Please take Tylenol for pain.  Please return to the emergency room for any worsening symptoms you might have.

## 2022-05-10 NOTE — ED Provider Notes (Signed)
[facility] EMERGENCY DEPARTMENT Provider Note   CSN: [id number] Arrival date & time: 05/10/22  0831     History Chief Complaint  Patient presents with   Back Pain   Leg Pain    [name] is a 61 y.o. female patient who presents to the emergency department today with diffuse lower back pain that radiates down the left leg to the foot.  This has been ongoing since Friday.  She denies any bowel or bladder incontinence, weakness of the left lower extremity, fever, chills.  Patient does have a history of diabetic neuropathy and usually walks with a cane.  Patient has been unable to ambulate secondary to pain.  She describes this pain as a sharp shooting sensation down the left leg.   Back Pain Associated symptoms: leg pain   Leg Pain Associated symptoms: back pain        Home Medications Prior to Admission medications   Medication Sig Start Date End Date Taking? Authorizing Provider  lidocaine (LIDODERM) 5 % Place 1 patch onto the skin daily. Remove & Discard patch within 12 hours or as directed by MD 05/10/22  Yes Raul Del, Joshawa Dubin M, PA-C  predniSONE (DELTASONE) 20 MG tablet Take 2 tablets (40 mg total) by mouth daily. 05/10/22  Yes Raul Del, Laronda Lisby M, PA-C  acetaminophen (TYLENOL) 325 MG tablet Take 975 mg by mouth every 8 (eight) hours as needed.    [provider]  albuterol (VENTOLIN HFA) 108 (90 Base) MCG/ACT inhaler Inhale 2 puffs into the lungs every 4 (four) hours as needed for wheezing or shortness of breath (for rescue). 04/06/22   Claretta Fraise, MD  Alpha-Lipoic Acid 600 MG CAPS Take 1 capsule (600 mg total) by mouth daily. For diabetic neuropathy 02/13/21   Ronnie Doss M, DO  amoxicillin-clavulanate (AUGMENTIN) 875-125 MG tablet Take 1 tablet by mouth 2 (two) times daily. Take all of this medication 04/06/22   Claretta Fraise, MD  aspirin EC 81 MG tablet Take 1 tablet (81 mg total) by mouth daily. 08/05/20   Verta Ellen., NP  Blood Glucose Monitoring Suppl  [facility] VERIO) w/Device KIT Use to test blood sugar twice daily as directed; DX: E11.69 03/26/20   Ronnie Doss M, DO  diclofenac sodium (VOLTAREN) 1 % GEL Apply 4 g topically 4 (four) times daily. 05/16/19   Janora Norlander, DO  furosemide (LASIX) 40 MG tablet TAKE 1 TABLET BY MOUTH ONCE DAILY AS NEEDED FOR EDEMA OR FLUID 03/09/22   Ronnie Doss M, DO  gabapentin (NEURONTIN) 600 MG tablet Take 1.5 tablets (900 mg total) by mouth 3 (three) times daily. 02/13/21   Janora Norlander, DO  glimepiride (AMARYL) 2 MG tablet Take 2 mg by mouth daily. 08/08/21   [provider]  glucose blood (ONETOUCH VERIO) test strip Use to test blood sugar twice daily as directed; DX: E11.69 03/26/20   Ronnie Doss M, DO  Insulin Glargine (BASAGLAR KWIKPEN) 100 UNIT/ML Inject 60 Units into the skin at bedtime. Patient taking differently: Inject 40 Units into the skin at bedtime. 12/24/21 03/24/22  Ronnie Doss M, DO  metFORMIN (GLUCOPHAGE-XR) 500 MG 24 hr tablet TAKE 2 TABLETS BY MOUTH ONCE DAILY WITH BREAKFAST 03/09/22   Ronnie Doss M, DO  metoprolol tartrate (LOPRESSOR) 25 MG tablet Take 1 tablet by mouth twice daily 02/15/22   Ronnie Doss M, DO  nitroGLYCERIN (NITROSTAT) 0.4 MG SL tablet Place 1 tablet (0.4 mg total) under the tongue every 5 (five) minutes as needed  for chest pain. 08/05/20   Verta Ellen., NP  omeprazole (PRILOSEC) 40 MG capsule TAKE 1 CAPSULE BY MOUTH ONCE DAILY 30 MINUTES BEFORE MEAL(S) 03/09/22   Ronnie Doss M, DO  OneTouch Delica Lancets 22Q MISC Use to test blood sugar twice daily as directed; DX: E11.69 03/26/20   Ronnie Doss M, DO  OneTouch Delica Lancets 33H MISC Check blood sugar BID and prn  E11.9 03/26/20   Ronnie Doss M, DO  rosuvastatin (CRESTOR) 20 MG tablet TAKE 1 TABLET BY MOUTH AT BEDTIME 03/09/22   Gottschalk, Ashly M, DO  Semaglutide (RYBELSUS) 7 MG TABS Take 14 mg by mouth daily.    [provider]  sertraline (ZOLOFT)  50 MG tablet Take 1 tablet (50 mg total) by mouth daily. Daily for depression/ anxiety 01/12/22   Ronnie Doss M, DO  telmisartan (MICARDIS) 40 MG tablet Take 1 tablet (40 mg total) by mouth daily. 12/05/20   Janora Norlander, DO      Allergies    Duloxetine, Farxiga [dapagliflozin], and Morphine and related    Review of Systems   Review of Systems  Musculoskeletal:  Positive for back pain.  All other systems reviewed and are negative.   Physical Exam Updated Vital Signs BP 114/61   Pulse 73   Temp 97.7 F (36.5 C) (Oral)   Resp 18   Ht '5\' 2"'  (1.575 m)   Wt 129.3 kg   SpO2 90%   BMI 52.13 kg/m  Physical Exam Vitals and nursing note reviewed.  Constitutional:      Appearance: Normal appearance.  HENT:     Head: Normocephalic and atraumatic.  Eyes:     General:        Right eye: No discharge.        Left eye: No discharge.     Conjunctiva/sclera: Conjunctivae normal.  Pulmonary:     Effort: Pulmonary effort is normal.  Musculoskeletal:     Comments: There is diffuse tenderness across the entire lumbar back.  Positive straight leg raise on the left.  Equal plantar and dorsiflexion of the feet.  Subjective slight decrease sensation on the left lower extremity.  Skin:    General: Skin is warm and dry.     Findings: No rash.  Neurological:     General: No focal deficit present.     Mental Status: She is alert.  Psychiatric:        Mood and Affect: Mood normal.        Behavior: Behavior normal.     ED Results / Procedures / Treatments   Labs (all labs ordered are listed, but only abnormal results are displayed) Labs Reviewed - No data to display  EKG None  Radiology DG Lumbar Spine Complete  Result Date: 05/10/2022 CLINICAL DATA:  Lumbar back pain EXAM: LUMBAR SPINE - COMPLETE 4 VIEW COMPARISON:  None Available. FINDINGS: No evidence of lumbar spine fracture. Mild multilevel degenerative disc disease. Grade 1 anterolisthesis of L4 on L5. Moderate facet  arthropathy of the lower lumbar spine. Probable bilateral pars defects at L4-L5. Cholecystectomy clips. Hernia mesh noted overlying the pelvis. Aortoiliac atherosclerotic disease. IMPRESSION: 1. Multilevel mild degenerative disc disease and moderate facet arthropathy. 2. Grade 1 anterolisthesis of L4 on L5 with probable bilateral pars defects. Electronically Signed   By: Yetta Glassman M.D.   On: 05/10/2022 10:49    Procedures Procedures    Medications Ordered in ED Medications  lidocaine (LIDODERM) 5 % 1 patch (has no  administration in time range)  HYDROcodone-acetaminophen (NORCO/VICODIN) 5-325 MG per tablet 2 tablet (2 tablets Oral Given 05/10/22 0937)  fentaNYL (SUBLIMAZE) injection 50 mcg (50 mcg Intramuscular Given 05/10/22 1142)    ED Course/ Medical Decision Making/ A&P Clinical Course as of 05/10/22 1208  Mon May 10, 2022  1204 Daughter came and told me that the patient was feeling better after fentanyl and wishes to go home.  I Minna give her lidocaine patches here in the department and a prescription for lidocaine patches in addition for prednisone.  I will have her follow-up with orthopedics for further evaluation.  Patient unable this plan she is safer discharge at this time. [CF]  1205 DG Lumbar Spine Complete I personally ordered interpreted x-ray of the lumbar spine which shows evidence of anterolisthesis of the L4-L5 region.  I do agree with radiologist interpretation.  [CF]    Clinical Course User Index [CF] Hendricks Limes, PA-C                           Medical Decision Making SHARRONDA SCHWEERS is a 61 y.o. female patient who presents to the emergency department today with diffuse lower back pain that radiates down the left lower extremity.  Differential diagnosis includes muscular spasm, sciatica, lumbar strain, fracture, dislocation, complication of diabetic neuropathy.  Georgina Peer give her pain medication which includes Norco as patient cannot have Toradol secondary kidney  function but I personally reviewed a month ago.  Creatinine was 1.86 at that time.  I have a low suspicion for cauda equina or epidural abscess at this time.   Amount and/or Complexity of Data Reviewed Radiology: ordered. Decision-making details documented in ED Course.  Risk Prescription drug management. Decision regarding hospitalization. Risk Details: Patient feeling better after fentanyl.  I going to give her lidocaine patches here.  I do feel the patient safe for discharge today.  This is likely sciatica.  I would have her follow-up with her primary care doctor and orthopedics for further evaluation.  Steroids and lidocaine patches given for prescriptions.  She is safe for discharge at this time.   Final Clinical Impression(s) / ED Diagnoses Final diagnoses:  Acute bilateral low back pain with left-sided sciatica    Rx / DC Orders ED Discharge Orders          Ordered    predniSONE (DELTASONE) 20 MG tablet  Daily        05/10/22 1207    lidocaine (LIDODERM) 5 %  Every 24 hours        05/10/22 1207              Myna [place], PA-C 05/10/22 1208    Godfrey Pick, MD 05/12/22 [phone]

## 2022-05-10 NOTE — ED Triage Notes (Signed)
Patient has hx of neuropathy with hx of back sx. Patient complaining of lower back pain with left hip and left leg pain. Patient normally walks with cane.

## 2022-05-11 ENCOUNTER — Other Ambulatory Visit: Payer: Self-pay | Admitting: Family Medicine

## 2022-05-11 DIAGNOSIS — E1159 Type 2 diabetes mellitus with other circulatory complications: Secondary | ICD-10-CM

## 2022-05-17 DIAGNOSIS — M5416 Radiculopathy, lumbar region: Secondary | ICD-10-CM | POA: Diagnosis not present

## 2022-05-19 ENCOUNTER — Ambulatory Visit: Payer: Self-pay | Admitting: *Deleted

## 2022-05-19 DIAGNOSIS — E1159 Type 2 diabetes mellitus with other circulatory complications: Secondary | ICD-10-CM

## 2022-05-19 DIAGNOSIS — E1169 Type 2 diabetes mellitus with other specified complication: Secondary | ICD-10-CM

## 2022-05-19 NOTE — Patient Instructions (Signed)
[name]  I have enjoyed working with you through the Chronic Care Management Program at [facility]. Due to program changes and no recent contact with the RN Care Manager, I am removing myself from your care team. If you are currently active with another CCM Team Member, you will remain active with them unless they reach out to you with additional information. If you feel that you need RN Care Management services in the future, please talk with your primary care provider to discuss re-engagement with the RN Care Manager.   Thank you for allowing me to participate in your your healthcare journey.  Demetrios Loll, BSN, RN-BC Embedded Chronic Care Manager [facility] / [facility] Care Management Direct Dial: [phone]

## 2022-05-19 NOTE — Chronic Care Management (AMB) (Signed)
  Chronic Care Management   Note  05/19/2022 Name: [name] MRN: [id number] DOB: 1961/01/27   Patient has not recently engaged with the Chronic Care Management RN Care Manager. Removing RN Care Manager from Care Team and closing RN Care Management Care Plans. If patient is currently engaged with another CCM team member I will forward this encounter to inform them of my case closure. Patient may be eligible for re-engagement with RN Care Manager in the future if necessary and can discuss this with their PCP.  Demetrios Loll, BSN, RN-BC Embedded Chronic Care Manager [facility] / [facility] Care Management Direct Dial: [phone]

## 2022-05-26 ENCOUNTER — Other Ambulatory Visit: Payer: Self-pay | Admitting: Family Medicine

## 2022-05-26 DIAGNOSIS — E1142 Type 2 diabetes mellitus with diabetic polyneuropathy: Secondary | ICD-10-CM

## 2022-05-26 DIAGNOSIS — I152 Hypertension secondary to endocrine disorders: Secondary | ICD-10-CM

## 2022-05-26 DIAGNOSIS — K219 Gastro-esophageal reflux disease without esophagitis: Secondary | ICD-10-CM

## 2022-06-09 ENCOUNTER — Ambulatory Visit (INDEPENDENT_AMBULATORY_CARE_PROVIDER_SITE_OTHER): Payer: PPO | Admitting: Family Medicine

## 2022-06-09 ENCOUNTER — Encounter: Payer: Self-pay | Admitting: Family Medicine

## 2022-06-09 VITALS — BP 126/71 | HR 80 | Temp 97.4°F | Ht 62.0 in | Wt 274.6 lb

## 2022-06-09 DIAGNOSIS — E785 Hyperlipidemia, unspecified: Secondary | ICD-10-CM

## 2022-06-09 DIAGNOSIS — F32A Depression, unspecified: Secondary | ICD-10-CM | POA: Diagnosis not present

## 2022-06-09 DIAGNOSIS — E113393 Type 2 diabetes mellitus with moderate nonproliferative diabetic retinopathy without macular edema, bilateral: Secondary | ICD-10-CM | POA: Diagnosis not present

## 2022-06-09 DIAGNOSIS — M5410 Radiculopathy, site unspecified: Secondary | ICD-10-CM | POA: Diagnosis not present

## 2022-06-09 DIAGNOSIS — F329 Major depressive disorder, single episode, unspecified: Secondary | ICD-10-CM | POA: Diagnosis not present

## 2022-06-09 DIAGNOSIS — F411 Generalized anxiety disorder: Secondary | ICD-10-CM

## 2022-06-09 DIAGNOSIS — K219 Gastro-esophageal reflux disease without esophagitis: Secondary | ICD-10-CM | POA: Diagnosis not present

## 2022-06-09 DIAGNOSIS — E1142 Type 2 diabetes mellitus with diabetic polyneuropathy: Secondary | ICD-10-CM

## 2022-06-09 DIAGNOSIS — I152 Hypertension secondary to endocrine disorders: Secondary | ICD-10-CM | POA: Diagnosis not present

## 2022-06-09 DIAGNOSIS — E1169 Type 2 diabetes mellitus with other specified complication: Secondary | ICD-10-CM | POA: Diagnosis not present

## 2022-06-09 DIAGNOSIS — E1159 Type 2 diabetes mellitus with other circulatory complications: Secondary | ICD-10-CM | POA: Diagnosis not present

## 2022-06-09 LAB — BAYER DCA HB A1C WAIVED: HB A1C (BAYER DCA - WAIVED): 5.9 % — ABNORMAL HIGH (ref 4.8–5.6)

## 2022-06-09 MED ORDER — TELMISARTAN 40 MG PO TABS: 40.0000 mg | ORAL_TABLET | Freq: Every day | ORAL | 1 refills | Status: DC

## 2022-06-09 MED ORDER — LIDOCAINE 5 % EX PTCH: 1.0000 | MEDICATED_PATCH | CUTANEOUS | 0 refills | Status: DC

## 2022-06-09 MED ORDER — OMEPRAZOLE 40 MG PO CPDR: 40.0000 mg | DELAYED_RELEASE_CAPSULE | Freq: Every day | ORAL | 3 refills | Status: DC

## 2022-06-09 MED ORDER — METHOCARBAMOL 500 MG PO TABS: 500.0000 mg | ORAL_TABLET | Freq: Four times a day (QID) | ORAL | 0 refills | Status: DC | PRN

## 2022-06-09 MED ORDER — FREESTYLE LIBRE 2 READER DEVI: 0 refills | Status: DC

## 2022-06-09 MED ORDER — GABAPENTIN 600 MG PO TABS: ORAL_TABLET | ORAL | 3 refills | Status: DC

## 2022-06-09 MED ORDER — DICLOFENAC SODIUM 1 % EX GEL: 2.0000 g | Freq: Four times a day (QID) | CUTANEOUS | 3 refills | Status: DC

## 2022-06-09 MED ORDER — ROSUVASTATIN CALCIUM 20 MG PO TABS: 20.0000 mg | ORAL_TABLET | Freq: Every day | ORAL | 3 refills | Status: DC

## 2022-06-09 MED ORDER — FREESTYLE LIBRE 2 SENSOR MISC: 3 refills | Status: DC

## 2022-06-09 MED ORDER — FUROSEMIDE 40 MG PO TABS: ORAL_TABLET | ORAL | 3 refills | Status: DC

## 2022-06-09 MED ORDER — SERTRALINE HCL 50 MG PO TABS
50.0000 mg | ORAL_TABLET | Freq: Every day | ORAL | 3 refills | Status: DC
Start: 2022-06-09 — End: 2023-03-21

## 2022-06-09 MED ORDER — METOPROLOL TARTRATE 25 MG PO TABS: 25.0000 mg | ORAL_TABLET | Freq: Two times a day (BID) | ORAL | 3 refills | Status: DC

## 2022-06-09 NOTE — Progress Notes (Signed)
 Subjective: CC:DM PCP: ,  M, DO HPI:[name] is a 61 y.o. female presenting to clinic today for:  1. Type 2 Diabetes with hypertension, hyperlipidemia:  Compliant with 60u of basaglar.  She is accompanied today by her daughter who notes that she really does not monitor her blood sugars but patient notes that her max blood sugars around 125 with lowest at 70 and average. Patient reports chronic diarrhea that is worsening over the last year.  She takes metformin and has not trialed coming off of the metformin in efforts to improve diarrhea but would be willing to do so.  She was dizzy this morning.  Last eye exam: needs Last foot exam: UTD Last A1c:  Lab Results  Component Value Date   HGBA1C 6.6 (H) 01/12/2022   Nephropathy screen indicated?: UTD Last flu, zoster and/or pneumovax:  Immunization History  Administered Date(s) Administered   Influenza,inj,Quad PF,6+ Mos 08/10/2013, 08/28/2014, 09/25/2015, 11/23/2017, 11/27/2018   Pneumococcal Conjugate-13 11/27/2018    2.  Uncontrolled depression Patient never did start the sertraline that was prescribed earlier this year for depression because she has been afraid to take anything.  She does not want to be dependent on anything to help her feel better.  Her daughter notes that her mother essentially has "given up" after the passing of her Papa.  3.  Low back pain with radicular symptoms Patient reports she had severe low back pain rating to the left lower extremity such that she has become totally weak in that left lower extremity.  Her daughter called orthopedics in [place] and she has been seen there now.  She has an MRI scheduled for Saturday.  She notes the methocarbamol is helpful and she would like to continue with able.  She is taking it roughly 3 times a day and occasional fourth if severely painful but she tries to take the minimal amount possible.  Denies any excessive sedation from this or falls.  They  prescribed lidocaine patches but unfortunately were not successful in getting this covered by the insurance.  Her specialist is worried that she may need higher levels of diuretic because of her lymphedema.  She takes Lasix 40 mg daily.  She was prescribed lymphedema pumps a while back but after further questioning she has not been using.  It sounds like this mostly has to do with depression.  She does know how to use them however.    ROS: Per HPI  Allergies  Allergen Reactions   Duloxetine    Farxiga [Dapagliflozin]     RECURRENT YEAST INFECTIONS   Morphine And Related Nausea And Vomiting   Past Medical History:  Diagnosis Date   Arthritis    CAD (coronary artery disease)    BMS to circumflex 2007 - Dr. Brodie   CKD (chronic kidney disease) stage 3, GFR 30-59 ml/min (HCC)    Essential hypertension    Falls frequently    GERD (gastroesophageal reflux disease)    HA (headache)    Hyperlipidemia    Hyperlipidemia associated with type 2 diabetes mellitus (HCC) 02/03/2011   Left-sided face pain    Morbid obesity (HCC)    Noncompliance    OSA (obstructive sleep apnea)    Uses CPAP   Type 2 diabetes mellitus (HCC)    Urinary incontinence     Current Outpatient Medications:    acetaminophen (TYLENOL) 325 MG tablet, Take 975 mg by mouth every 8 (eight) hours as needed., Disp: , Rfl:    albuterol (  VENTOLIN HFA) 108 (90 Base) MCG/ACT inhaler, Inhale 2 puffs into the lungs every 4 (four) hours as needed for wheezing or shortness of breath (for rescue)., Disp: 1 each, Rfl: 11   Alpha-Lipoic Acid 600 MG CAPS, Take 1 capsule (600 mg total) by mouth daily. For diabetic neuropathy, Disp: 100 capsule, Rfl: 3   aspirin EC 81 MG tablet, Take 1 tablet (81 mg total) by mouth daily., Disp: , Rfl:    Blood Glucose Monitoring Suppl (ONETOUCH VERIO) w/Device KIT, Use to test blood sugar twice daily as directed; DX: E11.69, Disp: 1 kit, Rfl: 1   diclofenac sodium (VOLTAREN) 1 % GEL, Apply 4 g topically  4 (four) times daily., Disp: 400 g, Rfl: 2   furosemide (LASIX) 40 MG tablet, TAKE 1 TABLET BY MOUTH ONCE DAILY AS NEEDED FOR EDEMA OR FLUID, Disp: 90 tablet, Rfl: 0   gabapentin (NEURONTIN) 600 MG tablet, TAKE 1 & 1/2 (ONE & ONE-HALF) TABLETS BY MOUTH THREE TIMES DAILY, Disp: 270 tablet, Rfl: 0   glimepiride (AMARYL) 2 MG tablet, Take 2 mg by mouth daily., Disp: , Rfl:    glucose blood (ONETOUCH VERIO) test strip, Use to test blood sugar twice daily as directed; DX: E11.69, Disp: 100 each, Rfl: 5   Insulin Glargine (BASAGLAR KWIKPEN) 100 UNIT/ML, Inject 60 Units into the skin at bedtime. (Patient taking differently: Inject 40 Units into the skin at bedtime.), Disp: 45 mL, Rfl: 12   Lidocaine 3.5 % LOTN, Apply 1 application  topically as needed., Disp: 120 g, Rfl: 0   metFORMIN (GLUCOPHAGE-XR) 500 MG 24 hr tablet, TAKE 2 TABLETS BY MOUTH ONCE DAILY WITH BREAKFAST, Disp: 180 tablet, Rfl: 0   metoprolol tartrate (LOPRESSOR) 25 MG tablet, Take 1 tablet by mouth twice daily, Disp: 180 tablet, Rfl: 0   nitroGLYCERIN (NITROSTAT) 0.4 MG SL tablet, Place 1 tablet (0.4 mg total) under the tongue every 5 (five) minutes as needed for chest pain., Disp: 25 tablet, Rfl: 3   omeprazole (PRILOSEC) 40 MG capsule, TAKE 1 CAPSULE BY MOUTH ONCE DAILY 30 MINUTES BEFORE MEAL(S), Disp: 90 capsule, Rfl: 0   OneTouch Delica Lancets 69C MISC, Use to test blood sugar twice daily as directed; DX: E11.69, Disp: 100 each, Rfl: 3   OneTouch Delica Lancets 78L MISC, Check blood sugar BID and prn  E11.9, Disp: 100 each, Rfl: 11   rosuvastatin (CRESTOR) 20 MG tablet, TAKE 1 TABLET BY MOUTH AT BEDTIME, Disp: 90 tablet, Rfl: 0   Semaglutide (RYBELSUS) 7 MG TABS, Take 14 mg by mouth daily., Disp: , Rfl:    sertraline (ZOLOFT) 50 MG tablet, Take 1 tablet (50 mg total) by mouth daily. Daily for depression/ anxiety, Disp: 30 tablet, Rfl: 0   telmisartan (MICARDIS) 40 MG tablet, Take 1 tablet (40 mg total) by mouth daily., Disp: 90 tablet,  Rfl: 1 Social History   Socioeconomic History   Marital status: Married    Spouse name: Gwyndolyn Saxon   Number of children: 5   Years of education: 10 th   Highest education level: 10th grade  Occupational History   Occupation: disabled     Comment: disabled  Tobacco Use   Smoking status: Every Day    Packs/day: 1.00    Years: 47.00    Total pack years: 47.00    Types: Cigarettes    Start date: 05/14/1972   Smokeless tobacco: Never  Vaping Use   Vaping Use: Never used  Substance and Sexual Activity   Alcohol use: No  Alcohol/week: 0.0 standard drinks of alcohol   Drug use: No   Sexual activity: Not Currently    Birth control/protection: Surgical  Other Topics Concern   Not on file  Social History Narrative   Patient lives at home with her husband. Patient is disabled.   Children live nearby   Patient has 10 th grade education.   Right handed.   Caffeine- one  cup of coffee and  One soda Dr.Pepper/ tea. daily   Social Determinants of Health   Financial Resource Strain: High Risk (12/29/2021)   Overall Financial Resource Strain (CARDIA)    Difficulty of Paying Living Expenses: Very hard  Food Insecurity: No Food Insecurity (12/28/2021)   Hunger Vital Sign    Worried About Running Out of Food in the Last Year: Never true    Ran Out of Food in the Last Year: Never true  Transportation Needs: Unmet Transportation Needs (12/28/2021)   PRAPARE - Hydrologist (Medical): Yes    Lack of Transportation (Non-Medical): No  Physical Activity: Inactive (12/28/2021)   Exercise Vital Sign    Days of Exercise per Week: 0 days    Minutes of Exercise per Session: 0 min  Stress: Stress Concern Present (12/28/2021)   [place]    Feeling of Stress : To some extent  Social Connections: Moderately Integrated (12/28/2021)   Social Connection and Isolation Panel [NHANES]    Frequency of Communication  with Friends and Family: More than three times a week    Frequency of Social Gatherings with Friends and Family: Three times a week    Attends Religious Services: Never    Active Member of Clubs or Organizations: No    Attends Archivist Meetings: 1 to 4 times per year    Marital Status: Married  Human resources officer Violence: Not At Risk (12/28/2021)   Humiliation, Afraid, Rape, and Kick questionnaire    Fear of Current or Ex-Partner: No    Emotionally Abused: No    Physically Abused: No    Sexually Abused: No   Family History  Problem Relation Age of Onset   Breast cancer Mother    Cancer Father    Coronary artery disease Father    Cancer - Colon Father    Diabetes Father    High Cholesterol Father    Arthritis Sister    Asthma Sister    High Cholesterol Daughter    Thyroid disease Daughter    Hiatal hernia Son    Congenital heart disease Son     Objective: Office vital signs reviewed. BP 126/71   Pulse 80   Temp (!) 97.4 F (36.3 C)   Ht 5' 2" (1.575 m)   Wt 274 lb 9.6 oz (124.6 kg)   SpO2 95%   BMI 50.22 kg/m   Physical Examination:  General: Awake, alert, nontoxic female, No acute distress HEENT: Sclera white.  Poor dentition Cardio: regular rate and rhythm  Pulm: Normal work of breathing on room air Extremities: warm, well perfused, moderate to severe nonpitting edema consistent with lymphedema bilateral lower extremities present MSK: Arrives in wheelchair Psych: Depressed, tearful  Assessment/ Plan: 61 y.o. female   Type 2 diabetes mellitus with diabetic polyneuropathy, without long-term current use of insulin ([place]) - Plan: CMP14+EGFR, Bayer DCA Hb A1c Waived, Continuous Blood Gluc Receiver (FREESTYLE LIBRE 2 READER) DEVI, Continuous Blood Gluc Sensor (FREESTYLE LIBRE 2 SENSOR) MISC, gabapentin (NEURONTIN) 600 MG tablet  Moderate nonproliferative diabetic retinopathy of both eyes without macular edema associated with type 2 diabetes mellitus  (HCC)  Hyperlipidemia associated with type 2 diabetes mellitus (HCC) - Plan: rosuvastatin (CRESTOR) 20 MG tablet  Hypertension associated with diabetes (HCC) - Plan: metoprolol tartrate (LOPRESSOR) 25 MG tablet, furosemide (LASIX) 40 MG tablet  Reactive depression - Plan: sertraline (ZOLOFT) 50 MG tablet  Generalized anxiety disorder - Plan: sertraline (ZOLOFT) 50 MG tablet  Depressive disorder  Gastroesophageal reflux disease without esophagitis - Plan: omeprazole (PRILOSEC) 40 MG capsule  Back pain with radiculopathy - Plan: methocarbamol (ROBAXIN) 500 MG tablet, lidocaine (LIDODERM) 5 %  Sugar under very tight control with A1c down to 5.9.  Okay to discontinue metformin as I think this is likely what is causing her diarrhea.  Continue other medications at current dose.  I would like her to see Julie in the next couple of weeks for freestyle libre to ease sugar checks.  Perhaps we could discontinue glipizide at some point as well.  Check CMP given chronic diarrhea.  Blood pressure is stable.  Continue statin  Zoloft renewed for anxiety disorder and depressive disorder.  Discussed the importance of controlling her symptoms so that the rest of her medical issues also stay stable.  I am glad to coordinate therapy if she would like but right now she just wants to try going onto the pill  PPI renewed.  We will eagerly await recommendations by back specialist.  I did encourage her to use her lymphedema pumps as I do not think that advancing the diuretic will make any difference in this nonpitting edema.  Her lower extremities are consistent with lymphedema.  Discussed elevation of legs as tolerated.  Watch salt intake.  Robaxin renewed.  Lidocaine patches ordered.  May need to consider chronic pain management if unable to improve symptoms with surgical interventions.  Handicap placard given to the patient today  No orders of the defined types were placed in this encounter.  No orders of the  defined types were placed in this encounter.     M , DO [facility] [phone]   

## 2022-06-10 ENCOUNTER — Telehealth: Payer: Self-pay

## 2022-06-10 LAB — CMP14+EGFR
ALT: 9 IU/L (ref 0–32)
AST: 11 IU/L (ref 0–40)
Albumin/Globulin Ratio: 1.6 (ref 1.2–2.2)
Albumin: 3.9 g/dL (ref 3.9–4.9)
Alkaline Phosphatase: 85 IU/L (ref 44–121)
BUN/Creatinine Ratio: 10 — ABNORMAL LOW (ref 12–28)
BUN: 11 mg/dL (ref 8–27)
Bilirubin Total: 0.3 mg/dL (ref 0.0–1.2)
CO2: 27 mmol/L (ref 20–29)
Calcium: 9.4 mg/dL (ref 8.7–10.3)
Chloride: 97 mmol/L (ref 96–106)
Creatinine, Ser: 1.08 mg/dL — ABNORMAL HIGH (ref 0.57–1.00)
Globulin, Total: 2.5 g/dL (ref 1.5–4.5)
Glucose: 81 mg/dL (ref 70–99)
Potassium: 3.7 mmol/L (ref 3.5–5.2)
Sodium: 139 mmol/L (ref 134–144)
Total Protein: 6.4 g/dL (ref 6.0–8.5)
eGFR: 58 mL/min/{1.73_m2} — ABNORMAL LOW (ref >59)

## 2022-06-10 NOTE — Telephone Encounter (Signed)
Michaelle Copas Key: Caprice Beaver - PA Case ID: [id number] - Rx #: [id number] Need help? Call us at [phone] Status Sent to [facility] Lidocaine 5% patches Form [facility] Electronic Prior Authorization Form 2017 NCPDP Original Claim Info [id number]

## 2022-06-11 ENCOUNTER — Telehealth: Payer: Self-pay

## 2022-06-11 NOTE — Telephone Encounter (Signed)
Michaelle Copas Key: Caprice Beaver - PA Case ID: [id number] - Rx #: [id number] Need help? Call us at [phone] Status Sent to [facility] August 3 Drug Lidocaine 5% patches Form [employer] Electronic Prior Authorization Form 2017 NCPDP

## 2022-06-14 ENCOUNTER — Telehealth: Payer: Self-pay | Admitting: Family Medicine

## 2022-06-14 NOTE — Telephone Encounter (Signed)
Pt called to confirm what BP meds she is supposed to be taking because the pharmacy filled a medication called TELMISATAN and pt says she wasn't told to take this mediction for her BP and is already on other meds for her BP.  Pt also says that the pharmacy didn't fill her muscle relaxer Rx ROBAXIN.

## 2022-06-14 NOTE — Telephone Encounter (Signed)
Pt states she has not taken bp meds for months- is she suppose to be taking telmisartan?

## 2022-06-15 ENCOUNTER — Other Ambulatory Visit (HOSPITAL_COMMUNITY): Payer: Self-pay | Admitting: Orthopaedic Surgery

## 2022-06-15 DIAGNOSIS — M545 Low back pain, unspecified: Secondary | ICD-10-CM

## 2022-06-15 NOTE — Telephone Encounter (Signed)
Pt aware and picked up muscle relaxer

## 2022-06-15 NOTE — Telephone Encounter (Signed)
If she has not been taking the telmisartan, she does not have to start.  This has been on her med list and so it was refilled.  Make sure she brings ALL of the meds she is taking in so we can go through them, as it sounds like she is not sure what she is actually taking.  The methocarbamol was sent along with everything else. Not sure what the prob is with the pharmacy.

## 2022-06-24 ENCOUNTER — Ambulatory Visit: Payer: HMO | Admitting: Pharmacist

## 2022-06-28 ENCOUNTER — Telehealth: Payer: Self-pay | Admitting: *Deleted

## 2022-06-28 DIAGNOSIS — E1142 Type 2 diabetes mellitus with diabetic polyneuropathy: Secondary | ICD-10-CM

## 2022-06-28 NOTE — Telephone Encounter (Signed)
Pt assistance meds here for pick up - pt aware - she has appt on 8/22- will pick up then from front window.   #4 boxes of Rybelsus 7 mg tabs

## 2022-06-29 ENCOUNTER — Ambulatory Visit: Payer: HMO | Admitting: Pharmacist

## 2022-07-01 ENCOUNTER — Ambulatory Visit (HOSPITAL_COMMUNITY)
Admission: RE | Admit: 2022-07-01 | Discharge: 2022-07-01 | Disposition: A | Discharge status: Home / Self Care | Payer: HMO | Source: Ambulatory Visit | Attending: Orthopaedic Surgery | Admitting: Orthopaedic Surgery

## 2022-07-01 DIAGNOSIS — M545 Low back pain, unspecified: Secondary | ICD-10-CM | POA: Diagnosis not present

## 2022-07-01 DIAGNOSIS — M48061 Spinal stenosis, lumbar region without neurogenic claudication: Secondary | ICD-10-CM | POA: Diagnosis not present

## 2022-07-09 NOTE — Telephone Encounter (Signed)
Please inform pt.  Ok to get [employer], as this is similar but much cheaper than cash price of lidocaine rx patch

## 2022-07-09 NOTE — Telephone Encounter (Signed)
Pt aware of recommendations

## 2022-07-09 NOTE — Telephone Encounter (Signed)
PA denied for Lidocaine patches

## 2022-07-16 DIAGNOSIS — M5416 Radiculopathy, lumbar region: Secondary | ICD-10-CM | POA: Diagnosis not present

## 2022-08-06 ENCOUNTER — Ambulatory Visit (INDEPENDENT_AMBULATORY_CARE_PROVIDER_SITE_OTHER): Payer: HMO | Admitting: Family Medicine

## 2022-08-06 ENCOUNTER — Encounter: Payer: Self-pay | Admitting: Family Medicine

## 2022-08-06 VITALS — BP 128/64 | HR 72 | Temp 97.2°F | Ht 62.0 in | Wt 277.2 lb

## 2022-08-06 DIAGNOSIS — N281 Cyst of kidney, acquired: Secondary | ICD-10-CM | POA: Diagnosis not present

## 2022-08-06 DIAGNOSIS — M5410 Radiculopathy, site unspecified: Secondary | ICD-10-CM

## 2022-08-06 DIAGNOSIS — E1142 Type 2 diabetes mellitus with diabetic polyneuropathy: Secondary | ICD-10-CM | POA: Diagnosis not present

## 2022-08-06 MED ORDER — FREESTYLE LIBRE 3 SENSOR MISC: 10 refills | Status: DC

## 2022-08-06 MED ORDER — METHOCARBAMOL 500 MG PO TABS: 500.0000 mg | ORAL_TABLET | Freq: Four times a day (QID) | ORAL | 0 refills | Status: DC | PRN

## 2022-08-06 NOTE — Addendum Note (Signed)
Addended by: Lottie Dawson D on: 08/06/2022 11:01 AM   Modules accepted: Orders

## 2022-08-06 NOTE — Progress Notes (Signed)
Subjective: CC: Follow-up MRI PCP: Janora Norlander, DO PRF:FMBWG EUPHA LOBB is a 61 y.o. female presenting to clinic today for:  1.  MRI lumbar spine Patient reports having had an MRI of her lumbar spine recently with her orthopedist.  She will be undergoing some back injections under fluoroscopy soon.  She notes a couple of lesions on the kidneys that were found on the MRI and she wants to explore these further.  She was not sure if these were okay to have or if we need to do anything about them.  Of note her MRI did show progressive lumbar issues with interval posterior decompression of the L4 on L5.  She also has mild to moderate bilateral L4 foraminal stenosis and new multifactorial spinal and bilateral foraminal stenosis at L3 on L4.  She reports being in a lot of pain.  The injections are planned in about 12 days.  She is not able to take oral NSAIDs secondary to renal disease.  She is on a muscle relaxer and does need a renewal of this as this has been somewhat helpful.   ROS: Per HPI  Allergies  Allergen Reactions   Duloxetine    Farxiga [Dapagliflozin]     RECURRENT YEAST INFECTIONS   Morphine And Related Nausea And Vomiting   Past Medical History:  Diagnosis Date   Arthritis    CAD (coronary artery disease)    BMS to circumflex 2007 - Dr. Olevia Perches   CKD (chronic kidney disease) stage 3, GFR 30-59 ml/min (HCC)    Essential hypertension    Falls frequently    GERD (gastroesophageal reflux disease)    HA (headache)    Hyperlipidemia    Hyperlipidemia associated with type 2 diabetes mellitus ([place]) 02/03/2011   Left-sided face pain    Morbid obesity ([place])    Noncompliance    OSA (obstructive sleep apnea)    Uses CPAP   Type 2 diabetes mellitus (HCC)    Urinary incontinence     Current Outpatient Medications:    acetaminophen (TYLENOL) 325 MG tablet, Take 975 mg by mouth every 8 (eight) hours as needed., Disp: , Rfl:    albuterol (VENTOLIN HFA) 108 (90 Base) MCG/ACT  inhaler, Inhale 2 puffs into the lungs every 4 (four) hours as needed for wheezing or shortness of breath (for rescue)., Disp: 1 each, Rfl: 11   Alpha-Lipoic Acid 600 MG CAPS, Take 1 capsule (600 mg total) by mouth daily. For diabetic neuropathy, Disp: 100 capsule, Rfl: 3   aspirin EC 81 MG tablet, Take 1 tablet (81 mg total) by mouth daily., Disp: , Rfl:    Blood Glucose Monitoring Suppl (ONETOUCH VERIO) w/Device KIT, Use to test blood sugar twice daily as directed; DX: E11.69, Disp: 1 kit, Rfl: 1   Continuous Blood Gluc Receiver (FREESTYLE LIBRE 2 READER) DEVI, UAD to check sugars 4 times daily. E11.42, Disp: 1 each, Rfl: 0   Continuous Blood Gluc Sensor (FREESTYLE LIBRE 2 SENSOR) MISC, UAD to check sugar 4 times daily E11.42, Disp: 6 each, Rfl: 3   diclofenac Sodium (VOLTAREN) 1 % GEL, Apply 2 g topically 4 (four) times daily. For pain, Disp: 300 g, Rfl: 3   furosemide (LASIX) 40 MG tablet, TAKE 1 TABLET BY MOUTH ONCE DAILY AS NEEDED FOR EDEMA OR FLUID, Disp: 90 tablet, Rfl: 3   gabapentin (NEURONTIN) 600 MG tablet, TAKE 1 & 1/2 (ONE & ONE-HALF) TABLETS BY MOUTH THREE TIMES DAILY, Disp: 270 tablet, Rfl: 3   glimepiride (  AMARYL) 2 MG tablet, Take 2 mg by mouth daily., Disp: , Rfl:    glucose blood (ONETOUCH VERIO) test strip, Use to test blood sugar twice daily as directed; DX: E11.69, Disp: 100 each, Rfl: 5   lidocaine (LIDODERM) 5 %, Place 1 patch onto the skin daily. Remove & Discard patch within 12 hours or as directed by MD, Disp: 30 patch, Rfl: 0   Lidocaine 3.5 % LOTN, Apply 1 application  topically as needed., Disp: 120 g, Rfl: 0   methocarbamol (ROBAXIN) 500 MG tablet, Take 1 tablet (500 mg total) by mouth every 6 (six) hours as needed for muscle spasms., Disp: 120 tablet, Rfl: 0   metoprolol tartrate (LOPRESSOR) 25 MG tablet, Take 1 tablet (25 mg total) by mouth 2 (two) times daily., Disp: 180 tablet, Rfl: 3   nitroGLYCERIN (NITROSTAT) 0.4 MG SL tablet, Place 1 tablet (0.4 mg total) under  the tongue every 5 (five) minutes as needed for chest pain., Disp: 25 tablet, Rfl: 3   omeprazole (PRILOSEC) 40 MG capsule, Take 1 capsule (40 mg total) by mouth daily. For heartburn, Disp: 90 capsule, Rfl: 3   OneTouch Delica Lancets 17C MISC, Use to test blood sugar twice daily as directed; DX: E11.69, Disp: 100 each, Rfl: 3   OneTouch Delica Lancets 94W MISC, Check blood sugar BID and prn  E11.9, Disp: 100 each, Rfl: 11   rosuvastatin (CRESTOR) 20 MG tablet, Take 1 tablet (20 mg total) by mouth at bedtime., Disp: 90 tablet, Rfl: 3   Semaglutide (RYBELSUS) 7 MG TABS, Take 14 mg by mouth daily., Disp: , Rfl:    sertraline (ZOLOFT) 50 MG tablet, Take 1 tablet (50 mg total) by mouth daily. Daily for depression/ anxiety, Disp: 90 tablet, Rfl: 3   telmisartan (MICARDIS) 40 MG tablet, Take 1 tablet (40 mg total) by mouth daily., Disp: 90 tablet, Rfl: 1   Insulin Glargine (BASAGLAR KWIKPEN) 100 UNIT/ML, Inject 60 Units into the skin at bedtime. (Patient taking differently: Inject 40 Units into the skin at bedtime.), Disp: 45 mL, Rfl: 12 Social History   Socioeconomic History   Marital status: Married    Spouse name: Gwyndolyn Saxon   Number of children: 5   Years of education: 10 th   Highest education level: 10th grade  Occupational History   Occupation: disabled     Comment: disabled  Tobacco Use   Smoking status: Every Day    Packs/day: 1.00    Years: 47.00    Total pack years: 47.00    Types: Cigarettes    Start date: 05/14/1972   Smokeless tobacco: Never  Vaping Use   Vaping Use: Never used  Substance and Sexual Activity   Alcohol use: No    Alcohol/week: 0.0 standard drinks of alcohol   Drug use: No   Sexual activity: Not Currently    Birth control/protection: Surgical  Other Topics Concern   Not on file  Social History Narrative   Patient lives at home with her husband. Patient is disabled.   Children live nearby   Patient has 10 th grade education.   Right handed.   Caffeine- one   cup of coffee and  One soda Dr.Pepper/ tea. daily   Social Determinants of Health   Financial Resource Strain: High Risk (12/29/2021)   Overall Financial Resource Strain (CARDIA)    Difficulty of Paying Living Expenses: Very hard  Food Insecurity: No Food Insecurity (12/28/2021)   Hunger Vital Sign    Worried About Running Out  of Food in the Last Year: Never true    Ran Out of Food in the Last Year: Never true  Transportation Needs: Unmet Transportation Needs (12/28/2021)   PRAPARE - Hydrologist (Medical): Yes    Lack of Transportation (Non-Medical): No  Physical Activity: Inactive (12/28/2021)   Exercise Vital Sign    Days of Exercise per Week: 0 days    Minutes of Exercise per Session: 0 min  Stress: Stress Concern Present (12/28/2021)   [place]    Feeling of Stress : To some extent  Social Connections: Moderately Integrated (12/28/2021)   Social Connection and Isolation Panel [NHANES]    Frequency of Communication with Friends and Family: More than three times a week    Frequency of Social Gatherings with Friends and Family: Three times a week    Attends Religious Services: Never    Active Member of Clubs or Organizations: No    Attends Archivist Meetings: 1 to 4 times per year    Marital Status: Married  Human resources officer Violence: Not At Risk (12/28/2021)   Humiliation, Afraid, Rape, and Kick questionnaire    Fear of Current or Ex-Partner: No    Emotionally Abused: No    Physically Abused: No    Sexually Abused: No   Family History  Problem Relation Age of Onset   Breast cancer Mother    Cancer Father    Coronary artery disease Father    Cancer - Colon Father    Diabetes Father    High Cholesterol Father    Arthritis Sister    Asthma Sister    High Cholesterol Daughter    Thyroid disease Daughter    Hiatal hernia Son    Congenital heart disease Son      Objective: Office vital signs reviewed. BP 128/64   Pulse 72   Temp (!) 97.2 F (36.2 C)   Ht '5\' 2"'  (1.575 m)   Wt 277 lb 3.2 oz (125.7 kg)   SpO2 98%   BMI 50.70 kg/m   Physical Examination:  General: Awake, alert, appears uncomfortable Cardio: regular rate and rhythm  Pulm: Normal work of breathing on room air MSK: Very antalgic gait.  Requires assistance from her daughter to ambulate  Assessment/ Plan: 61 y.o. female   Bilateral renal cysts  Back pain with radiculopathy - Plan: methocarbamol (ROBAXIN) 500 MG tablet  Type 2 diabetes mellitus with diabetic polyneuropathy, without long-term current use of insulin (HCC)  Discussed the benign nature of these renal cysts which were noted on MRI.  No further work-up needed  Robaxin has been renewed.  Continue to follow-up with spinal specialist for ongoing management of chronic back pain.  May need referral to pain management but she wanted to defer this until after the injections  Our clinical pharmacist also saw her today for freestyle libre application and demonstration.  No orders of the defined types were placed in this encounter.  No orders of the defined types were placed in this encounter.    Janora Norlander, DO [place] [phone]

## 2022-08-09 ENCOUNTER — Other Ambulatory Visit: Payer: HMO

## 2022-08-09 ENCOUNTER — Other Ambulatory Visit: Payer: Self-pay

## 2022-08-09 DIAGNOSIS — E1142 Type 2 diabetes mellitus with diabetic polyneuropathy: Secondary | ICD-10-CM

## 2022-08-09 LAB — BAYER DCA HB A1C WAIVED: HB A1C (BAYER DCA - WAIVED): 6.7 % — ABNORMAL HIGH (ref 4.8–5.6)

## 2022-08-09 NOTE — Addendum Note (Signed)
Addended by: Everlean Cherry on: 08/09/2022 09:39 AM   Modules accepted: Orders

## 2022-08-09 NOTE — Addendum Note (Signed)
Addended by: Everlean Cherry on: 08/09/2022 09:43 AM   Modules accepted: Orders

## 2022-08-09 NOTE — Addendum Note (Signed)
Addended by: Everlean Cherry on: 08/09/2022 09:32 AM   Modules accepted: Orders

## 2022-08-10 ENCOUNTER — Telehealth: Payer: Self-pay | Admitting: Family Medicine

## 2022-08-10 LAB — CBC WITH DIFFERENTIAL/PLATELET
Basophils Absolute: 0.1 10*3/uL (ref 0.0–0.2)
Basos: 2 %
EOS (ABSOLUTE): 0.3 10*3/uL (ref 0.0–0.4)
Eos: 5 %
Hematocrit: 41.9 % (ref 34.0–46.6)
Hemoglobin: 14 g/dL (ref 11.1–15.9)
Immature Grans (Abs): 0 10*3/uL (ref 0.0–0.1)
Immature Granulocytes: 0 %
Lymphocytes Absolute: 3 10*3/uL (ref 0.7–3.1)
Lymphs: 40 %
MCH: 30.8 pg (ref 26.6–33.0)
MCHC: 33.4 g/dL (ref 31.5–35.7)
MCV: 92 fL (ref 79–97)
Monocytes Absolute: 0.6 10*3/uL (ref 0.1–0.9)
Monocytes: 7 %
Neutrophils Absolute: 3.5 10*3/uL (ref 1.4–7.0)
Neutrophils: 46 %
RBC: 4.54 x10E6/uL (ref 3.77–5.28)
RDW: 14.9 % (ref 11.7–15.4)
WBC: 7.5 10*3/uL (ref 3.4–10.8)

## 2022-08-10 LAB — LIPID PANEL
Chol/HDL Ratio: 4 ratio (ref 0.0–4.4)
Cholesterol, Total: 151 mg/dL (ref 100–199)
HDL: 38 mg/dL — ABNORMAL LOW (ref >39)
LDL Chol Calc (NIH): 76 mg/dL (ref 0–99)
Triglycerides: 224 mg/dL — ABNORMAL HIGH (ref 0–149)
VLDL Cholesterol Cal: 37 mg/dL (ref 5–40)

## 2022-08-10 NOTE — Telephone Encounter (Signed)
scheduled

## 2022-08-11 ENCOUNTER — Ambulatory Visit: Payer: HMO

## 2022-08-11 DIAGNOSIS — E1142 Type 2 diabetes mellitus with diabetic polyneuropathy: Secondary | ICD-10-CM

## 2022-08-11 NOTE — Progress Notes (Signed)
Libre placed and patient tolerated well.  

## 2022-08-19 ENCOUNTER — Encounter: Payer: Self-pay | Admitting: *Deleted

## 2022-09-09 ENCOUNTER — Telehealth: Payer: Self-pay

## 2022-09-09 NOTE — Telephone Encounter (Signed)
Mailed Novo Nordisk renewal application to patient home.   Alika Saladin L. CPhT Rx Patient Advocate  

## 2022-10-08 ENCOUNTER — Encounter: Payer: Self-pay | Admitting: Family Medicine

## 2022-10-08 ENCOUNTER — Ambulatory Visit (INDEPENDENT_AMBULATORY_CARE_PROVIDER_SITE_OTHER): Payer: HMO

## 2022-10-08 ENCOUNTER — Ambulatory Visit (INDEPENDENT_AMBULATORY_CARE_PROVIDER_SITE_OTHER): Payer: HMO | Admitting: Family Medicine

## 2022-10-08 VITALS — BP 133/71 | HR 79 | Temp 97.0°F | Ht 62.0 in | Wt 277.2 lb

## 2022-10-08 DIAGNOSIS — M5410 Radiculopathy, site unspecified: Secondary | ICD-10-CM | POA: Diagnosis not present

## 2022-10-08 DIAGNOSIS — M25542 Pain in joints of left hand: Secondary | ICD-10-CM | POA: Diagnosis not present

## 2022-10-08 MED ORDER — METHOCARBAMOL 500 MG PO TABS: 500.0000 mg | ORAL_TABLET | Freq: Four times a day (QID) | ORAL | 1 refills | Status: DC | PRN

## 2022-10-08 MED ORDER — METHYLPREDNISOLONE ACETATE 40 MG/ML IJ SUSP
40.0000 mg | Freq: Once | INTRAMUSCULAR | Status: AC
  Administered 2022-10-08: 40 mg via INTRAMUSCULAR

## 2022-10-08 NOTE — Patient Instructions (Addendum)
Tylenol arthritis ok to take 2-3 times daily for joint pain Diclofenac gel to affected area 2-3 times daily.

## 2022-10-08 NOTE — Progress Notes (Signed)
Subjective: CC: Left hand pain PCP: Janora Norlander, DO CLE:[name] is a 61 y.o. female presenting to clinic today for:  1.  Left hand pain Patient reports that she has been experiencing some pain in the left hand, particularly at the thumb joint.  Sometimes it will radiate into the other fingers as well.  No injury.  Pain has been gradually worsening over the last several years.  She is been using an arthritis glove.  Does not use NSAIDs secondary to renal disease.  Has not been utilizing Tylenol either because she has been using her muscle relaxer for her back.  She missed her back injection due to some scheduling issues and is asking for a corticosteroid injection today to help manage her low back pain and hand pain.  No sensory changes reported.  She is right-hand dominant.   ROS: Per HPI  Allergies  Allergen Reactions   Duloxetine    Farxiga [Dapagliflozin]     RECURRENT YEAST INFECTIONS   Morphine And Related Nausea And Vomiting   Past Medical History:  Diagnosis Date   Arthritis    CAD (coronary artery disease)    BMS to circumflex 2007 - Dr. Olevia Perches   CKD (chronic kidney disease) stage 3, GFR 30-59 ml/min (HCC)    Essential hypertension    Falls frequently    GERD (gastroesophageal reflux disease)    HA (headache)    Hyperlipidemia    Hyperlipidemia associated with type 2 diabetes mellitus ([place]) 02/03/2011   Left-sided face pain    Morbid obesity ([place])    Noncompliance    OSA (obstructive sleep apnea)    Uses CPAP   Type 2 diabetes mellitus (HCC)    Urinary incontinence     Current Outpatient Medications:    acetaminophen (TYLENOL) 325 MG tablet, Take 975 mg by mouth every 8 (eight) hours as needed., Disp: , Rfl:    albuterol (VENTOLIN HFA) 108 (90 Base) MCG/ACT inhaler, Inhale 2 puffs into the lungs every 4 (four) hours as needed for wheezing or shortness of breath (for rescue)., Disp: 1 each, Rfl: 11   Alpha-Lipoic Acid 600 MG CAPS, Take 1 capsule (600  mg total) by mouth daily. For diabetic neuropathy, Disp: 100 capsule, Rfl: 3   aspirin EC 81 MG tablet, Take 1 tablet (81 mg total) by mouth daily., Disp: , Rfl:    Blood Glucose Monitoring Suppl (ONETOUCH VERIO) w/Device KIT, Use to test blood sugar twice daily as directed; DX: E11.69, Disp: 1 kit, Rfl: 1   Continuous Blood Gluc Sensor (FREESTYLE LIBRE 3 SENSOR) MISC, Place 1 sensor on the skin every 14 days. Use to check glucose continuously; DX:E11.65, Disp: 2 each, Rfl: 10   diclofenac Sodium (VOLTAREN) 1 % GEL, Apply 2 g topically 4 (four) times daily. For pain, Disp: 300 g, Rfl: 3   furosemide (LASIX) 40 MG tablet, TAKE 1 TABLET BY MOUTH ONCE DAILY AS NEEDED FOR EDEMA OR FLUID, Disp: 90 tablet, Rfl: 3   gabapentin (NEURONTIN) 600 MG tablet, TAKE 1 & 1/2 (ONE & ONE-HALF) TABLETS BY MOUTH THREE TIMES DAILY, Disp: 270 tablet, Rfl: 3   glimepiride (AMARYL) 2 MG tablet, Take 2 mg by mouth daily., Disp: , Rfl:    glucose blood (ONETOUCH VERIO) test strip, Use to test blood sugar twice daily as directed; DX: E11.69, Disp: 100 each, Rfl: 5   Lidocaine 3.5 % LOTN, Apply 1 application  topically as needed., Disp: 120 g, Rfl: 0   metoprolol tartrate (  LOPRESSOR) 25 MG tablet, Take 1 tablet (25 mg total) by mouth 2 (two) times daily., Disp: 180 tablet, Rfl: 3   nitroGLYCERIN (NITROSTAT) 0.4 MG SL tablet, Place 1 tablet (0.4 mg total) under the tongue every 5 (five) minutes as needed for chest pain., Disp: 25 tablet, Rfl: 3   omeprazole (PRILOSEC) 40 MG capsule, Take 1 capsule (40 mg total) by mouth daily. For heartburn, Disp: 90 capsule, Rfl: 3   OneTouch Delica Lancets 13K MISC, Use to test blood sugar twice daily as directed; DX: E11.69, Disp: 100 each, Rfl: 3   OneTouch Delica Lancets 44W MISC, Check blood sugar BID and prn  E11.9, Disp: 100 each, Rfl: 11   rosuvastatin (CRESTOR) 20 MG tablet, Take 1 tablet (20 mg total) by mouth at bedtime., Disp: 90 tablet, Rfl: 3   Semaglutide (RYBELSUS) 7 MG TABS,  Take 14 mg by mouth daily., Disp: , Rfl:    sertraline (ZOLOFT) 50 MG tablet, Take 1 tablet (50 mg total) by mouth daily. Daily for depression/ anxiety, Disp: 90 tablet, Rfl: 3   telmisartan (MICARDIS) 40 MG tablet, Take 1 tablet (40 mg total) by mouth daily., Disp: 90 tablet, Rfl: 1   Insulin Glargine (BASAGLAR KWIKPEN) 100 UNIT/ML, Inject 60 Units into the skin at bedtime. (Patient taking differently: Inject 40 Units into the skin at bedtime.), Disp: 45 mL, Rfl: 12   methocarbamol (ROBAXIN) 500 MG tablet, Take 1 tablet (500 mg total) by mouth every 6 (six) hours as needed for muscle spasms., Disp: 120 tablet, Rfl: 1  Current Facility-Administered Medications:    methylPREDNISolone acetate (DEPO-MEDROL) injection 40 mg, 40 mg, Intramuscular, Once, Janora Norlander, DO Social History   Socioeconomic History   Marital status: Married    Spouse name: Gwyndolyn Saxon   Number of children: 5   Years of education: 10 th   Highest education level: 10th grade  Occupational History   Occupation: disabled     Comment: disabled  Tobacco Use   Smoking status: Every Day    Packs/day: 1.00    Years: 47.00    Total pack years: 47.00    Types: Cigarettes    Start date: 05/14/1972   Smokeless tobacco: Never  Vaping Use   Vaping Use: Never used  Substance and Sexual Activity   Alcohol use: No    Alcohol/week: 0.0 standard drinks of alcohol   Drug use: No   Sexual activity: Not Currently    Birth control/protection: Surgical  Other Topics Concern   Not on file  Social History Narrative   Patient lives at home with her husband. Patient is disabled.   Children live nearby   Patient has 10 th grade education.   Right handed.   Caffeine- one  cup of coffee and  One soda Dr.Pepper/ tea. daily   Social Determinants of Health   Financial Resource Strain: High Risk (12/29/2021)   Overall Financial Resource Strain (CARDIA)    Difficulty of Paying Living Expenses: Very hard  Food Insecurity: No Food  Insecurity (12/28/2021)   Hunger Vital Sign    Worried About Running Out of Food in the Last Year: Never true    Ran Out of Food in the Last Year: Never true  Transportation Needs: Unmet Transportation Needs (12/28/2021)   PRAPARE - Hydrologist (Medical): Yes    Lack of Transportation (Non-Medical): No  Physical Activity: Inactive (12/28/2021)   Exercise Vital Sign    Days of Exercise per Week: 0 days  Minutes of Exercise per Session: 0 min  Stress: Stress Concern Present (12/28/2021)   [employer]    Feeling of Stress : To some extent  Social Connections: Moderately Integrated (12/28/2021)   Social Connection and Isolation Panel [NHANES]    Frequency of Communication with Friends and Family: More than three times a week    Frequency of Social Gatherings with Friends and Family: Three times a week    Attends Religious Services: Never    Active Member of Clubs or Organizations: No    Attends Archivist Meetings: 1 to 4 times per year    Marital Status: Married  Human resources officer Violence: Not At Risk (12/28/2021)   Humiliation, Afraid, Rape, and Kick questionnaire    Fear of Current or Ex-Partner: No    Emotionally Abused: No    Physically Abused: No    Sexually Abused: No   Family History  Problem Relation Age of Onset   Breast cancer Mother    Cancer Father    Coronary artery disease Father    Cancer - Colon Father    Diabetes Father    High Cholesterol Father    Arthritis Sister    Asthma Sister    High Cholesterol Daughter    Thyroid disease Daughter    Hiatal hernia Son    Congenital heart disease Son     Objective: Office vital signs reviewed. BP 133/71   Pulse 79   Temp (!) 97 F (36.1 C)   Ht _0  (1.575 m)   Wt 277 lb 3.2 oz (125.7 kg)   SpO2 95%   BMI 50.70 kg/m   Physical Examination:  General: Awake, alert, morbidly obese, No acute distress MSK:  Ambulating with use of cane  Left hand: No gross soft tissue swelling, erythema or warmth.  She has tenderness palpation to the first MCP and PIP joints.  No results found.   Assessment/ Plan: 61 y.o. female   Arthralgia of metacarpophalangeal joint, left - Plan: methylPREDNISolone acetate (DEPO-MEDROL) injection 40 mg, DG Hand Complete Left  Back pain with radiculopathy - Plan: methocarbamol (ROBAXIN) 500 MG tablet, methylPREDNISolone acetate (DEPO-MEDROL) injection 40 mg  Obtain plain films of the left hand but I suspect MCP arthritis.  Discussed potential referral to orthopedics if desires intra articular corticosteroid injection.  Okay to schedule Tylenol arthritis, use Voltaren gel topically.  X-ray obtained which did show arthritic changes upon my personal review.  Formal review by radiology still pending.  Follow-up as needed  Orders Placed This Encounter  Procedures   DG Hand Complete Left    Standing Status:   Future    Standing Expiration Date:   10/09/2023    Order Specific Question:   Reason for Exam (SYMPTOM  OR DIAGNOSIS REQUIRED)    Answer:   thumb MCP pain    Order Specific Question:   Preferred imaging location?    Answer:   Internal   Meds ordered this encounter  Medications   methocarbamol (ROBAXIN) 500 MG tablet    Sig: Take 1 tablet (500 mg total) by mouth every 6 (six) hours as needed for muscle spasms.    Dispense:  120 tablet    Refill:  1   methylPREDNISolone acetate (DEPO-MEDROL) injection 40 mg     Janora Norlander, DO [place] [phone]

## 2022-10-14 ENCOUNTER — Telehealth: Payer: Self-pay | Admitting: Family Medicine

## 2022-10-14 NOTE — Progress Notes (Signed)
Patient returning call. Please call back

## 2022-10-14 NOTE — Telephone Encounter (Signed)
Patient has questions about patient assistance paperwork. Would like to speak to [place] directly about it.

## 2022-10-18 NOTE — Telephone Encounter (Signed)
Attempted to return call to patient. No answer/ voicemail.

## 2022-10-21 NOTE — Telephone Encounter (Signed)
Pt never rec'd novo nordisk re-enrollment application for Rybelsus.  Would like for [place] to do applications for both [employer] (rybelsus 14mg ) and (basaglar [place]) on her behalf. If we need copy of bank statements, she is happy to bring it by.   I will email applications to you for signatures (pt & pcp)

## 2022-10-26 ENCOUNTER — Other Ambulatory Visit: Payer: Self-pay | Admitting: Family Medicine

## 2022-10-26 DIAGNOSIS — M25542 Pain in joints of left hand: Secondary | ICD-10-CM

## 2022-11-12 ENCOUNTER — Ambulatory Visit: Payer: HMO | Admitting: Orthopedic Surgery

## 2022-11-25 ENCOUNTER — Other Ambulatory Visit (HOSPITAL_COMMUNITY): Payer: Self-pay

## 2022-11-26 ENCOUNTER — Encounter: Payer: Self-pay | Admitting: Orthopedic Surgery

## 2022-11-26 ENCOUNTER — Ambulatory Visit: Payer: PPO | Admitting: Orthopedic Surgery

## 2022-11-26 VITALS — BP 114/60 | HR 70 | Ht 62.0 in | Wt 283.0 lb

## 2022-11-26 DIAGNOSIS — M1711 Unilateral primary osteoarthritis, right knee: Secondary | ICD-10-CM

## 2022-11-26 DIAGNOSIS — G5602 Carpal tunnel syndrome, left upper limb: Secondary | ICD-10-CM

## 2022-11-26 NOTE — Patient Instructions (Signed)
Brace for the left wrist, wear at night time  EMG for left wrist, assess for carpal tunnel   Instructions Following Joint Injections  In clinic today, you received an injection in one of your joints (sometimes more than one).  Occasionally, you can have some pain at the injection site, this is normal.  You can place ice at the injection site, or take over-the-counter medications such as Tylenol (acetaminophen) or Advil (ibuprofen).  Please follow all directions listed on the bottle.  If your joint (knee or shoulder) becomes swollen, red or very painful, please contact the clinic for additional assistance.   Two medications were injected, including lidocaine and a steroid (often referred to as cortisone).  Lidocaine is effective almost immediately but wears off quickly.  However, the steroid can take a few days to improve your symptoms.  In some cases, it can make your pain worse for a couple of days.  Do not be concerned if this happens as it is common.  You can apply ice or take some over-the-counter medications as needed.

## 2022-11-27 NOTE — Progress Notes (Signed)
New Patient Visit  Assessment: [name] is a 62 y.o. female with the following: 1. Carpal tunnel syndrome, left upper limb 2. Arthritis of right knee  Plan: [name] has burning and shooting pains in the median nerve distribution.  She also has associated numbness and tingling.  Questionable atrophy of the thenar musculature.  I recommend a cock-up wrist splint, to be worn at nighttime.  I am also recommending EMGs to assess the severity of her symptoms.  During her clinic visit, she also complained of bilateral knee pain.  We do have an old x-ray of the right knee, which demonstrated severe osteoarthritis in 2019.  I offered her steroid injection, and she elected proceed.  This was completed in clinic without issues.    She will follow-up when the EMG results are available.  Procedure note injection Right knee joint   Verbal consent was obtained to inject the right knee joint  Timeout was completed to confirm the site of injection.  The skin was prepped with alcohol and ethyl chloride was sprayed at the injection site.  A 21-gauge needle was used to inject 40 mg of Depo-Medrol and 1% lidocaine (3 cc) into the right knee using an anterolateral approach.  There were no complications. A sterile bandage was applied.   Follow-up: Return for After EMG.  Subjective:  Chief Complaint  Patient presents with   Hand Pain    Left hand pain/burning for 2-3 months     History of Present Illness: [name] is a 62 y.o. female who has been referred by Delynn Flavin, DO for evaluation of left hand pain.  The past 2-3 months, she is complaining of severe pain in the left hand.  The pain is a burning and shooting type pain.  She has some numbness and tingling.  These complaints wake her up at night.  Has tried medicines with limited improvement.  She has been wearing a compression glove on her left flank.  She states that she has problems holding onto objects.  No prior injuries to  her left wrist or hand.  She is also complaining of bilateral knee pain.  No recent injections.  No recent x-rays.   Review of Systems: No fevers or chills + numbness or tingling No chest pain No shortness of breath No bowel or bladder dysfunction No GI distress No headaches   Medical History:  Past Medical History:  Diagnosis Date   Arthritis    CAD (coronary artery disease)    BMS to circumflex 2007 - Dr. Juanda Chance   CKD (chronic kidney disease) stage 3, GFR 30-59 ml/min (HCC)    Essential hypertension    Falls frequently    GERD (gastroesophageal reflux disease)    HA (headache)    Hyperlipidemia    Hyperlipidemia associated with type 2 diabetes mellitus (HCC) 02/03/2011   Left-sided face pain    Morbid obesity (HCC)    Noncompliance    OSA (obstructive sleep apnea)    Uses CPAP   Type 2 diabetes mellitus (HCC)    Urinary incontinence     Past Surgical History:  Procedure Laterality Date   ABDOMINAL HERNIA REPAIR     ABDOMINAL HYSTERECTOMY     BACK SURGERY     BREAST SURGERY     biopsy per right breast; pt states has silver piece in for marking    Cataract surgery Bilateral    CHOLECYSTECTOMY     COLONOSCOPY N/A 04/10/2013   Procedure: COLONOSCOPY;  Surgeon: Rogene Houston, MD;  Location: [facility];  Service: Endoscopy;  Laterality: N/A;   CORONARY ANGIOPLASTY WITH STENT PLACEMENT  2012   LEFT HEART CATH AND CORONARY ANGIOGRAPHY N/A 06/19/2020   Procedure: LEFT HEART CATH AND CORONARY ANGIOGRAPHY;  Surgeon: Belva Crome, MD;  Location: [facility];  Service: Cardiovascular;  Laterality: N/A;   LUMBAR LAMINECTOMY/DECOMPRESSION MICRODISCECTOMY N/A 04/25/2015   Procedure: CENTRAL LUMBAR /DECOMPRESSION L4-L5;  Surgeon: Latanya Maudlin, MD;  Location: [facility];  Service: Orthopedics;  Laterality: N/A;   TUBAL LIGATION      Family History  Problem Relation Age of Onset   Breast cancer Mother    Cancer Father    Coronary artery disease Father    Cancer -  Colon Father    Diabetes Father    High Cholesterol Father    Arthritis Sister    Asthma Sister    High Cholesterol Daughter    Thyroid disease Daughter    Hiatal hernia Son    Congenital heart disease Son    Social History   Tobacco Use   Smoking status: Every Day    Packs/day: 1.00    Years: 47.00    Total pack years: 47.00    Types: Cigarettes    Start date: 05/14/1972   Smokeless tobacco: Never  Vaping Use   Vaping Use: Never used  Substance Use Topics   Alcohol use: No    Alcohol/week: 0.0 standard drinks of alcohol   Drug use: No    Allergies  Allergen Reactions   Duloxetine    Farxiga [Dapagliflozin]     RECURRENT YEAST INFECTIONS   Morphine And Related Nausea And Vomiting    Current Meds  Medication Sig   acetaminophen (TYLENOL) 325 MG tablet Take 975 mg by mouth every 8 (eight) hours as needed.   albuterol (VENTOLIN HFA) 108 (90 Base) MCG/ACT inhaler Inhale 2 puffs into the lungs every 4 (four) hours as needed for wheezing or shortness of breath (for rescue).   Alpha-Lipoic Acid 600 MG CAPS Take 1 capsule (600 mg total) by mouth daily. For diabetic neuropathy   aspirin EC 81 MG tablet Take 1 tablet (81 mg total) by mouth daily.   Blood Glucose Monitoring Suppl (ONETOUCH VERIO) w/Device KIT Use to test blood sugar twice daily as directed; DX: E11.69   Continuous Blood Gluc Sensor (FREESTYLE LIBRE 3 SENSOR) MISC Place 1 sensor on the skin every 14 days. Use to check glucose continuously; DX:E11.65   diclofenac Sodium (VOLTAREN) 1 % GEL Apply 2 g topically 4 (four) times daily. For pain   furosemide (LASIX) 40 MG tablet TAKE 1 TABLET BY MOUTH ONCE DAILY AS NEEDED FOR EDEMA OR FLUID   gabapentin (NEURONTIN) 600 MG tablet TAKE 1 & 1/2 (ONE & ONE-HALF) TABLETS BY MOUTH THREE TIMES DAILY   glimepiride (AMARYL) 2 MG tablet Take 2 mg by mouth daily.   glucose blood (ONETOUCH VERIO) test strip Use to test blood sugar twice daily as directed; DX: E11.69   Lidocaine 3.5 %  LOTN Apply 1 application  topically as needed.   methocarbamol (ROBAXIN) 500 MG tablet Take 1 tablet (500 mg total) by mouth every 6 (six) hours as needed for muscle spasms.   metoprolol tartrate (LOPRESSOR) 25 MG tablet Take 1 tablet (25 mg total) by mouth 2 (two) times daily.   nitroGLYCERIN (NITROSTAT) 0.4 MG SL tablet Place 1 tablet (0.4 mg total) under the tongue every 5 (five) minutes as needed for chest  pain.   omeprazole (PRILOSEC) 40 MG capsule Take 1 capsule (40 mg total) by mouth daily. For heartburn   OneTouch Delica Lancets 47Q MISC Use to test blood sugar twice daily as directed; DX: Q59.56   OneTouch Delica Lancets 38V MISC Check blood sugar BID and prn  E11.9   rosuvastatin (CRESTOR) 20 MG tablet Take 1 tablet (20 mg total) by mouth at bedtime.   Semaglutide (RYBELSUS) 7 MG TABS Take 14 mg by mouth daily.   sertraline (ZOLOFT) 50 MG tablet Take 1 tablet (50 mg total) by mouth daily. Daily for depression/ anxiety   telmisartan (MICARDIS) 40 MG tablet Take 1 tablet (40 mg total) by mouth daily.    Objective: BP 114/60   Pulse 70   Ht 5\' 2"  (1.575 m)   Wt 283 lb (128.4 kg)   BMI 51.76 kg/m   Physical Exam:  General: Alert and oriented. and No acute distress. Gait: Slow, steady gait.  Evaluation of the left hand demonstrates no deformity.  Mild atrophy within the thenar eminence.  Positive Tinel's.  Positive Phalen's.  Positive Durkan's compression test.  4/5 grip strength.  Normal range of motion.  Right knee with slightly restricted range of motion.  Tenderness palpation medially.  No obvious deformity.  Negative Lachman.  No increased laxity varus valgus stress.  IMAGING: I personally ordered and reviewed the following images  X-rays of the left hand were obtained in clinic today.  No acute injuries are noted.  No dislocation.  Mild degenerative changes at the first [facility] joint.  No bony lesions.  Minimal degenerative changes otherwise.  Impression: Negative left hand  x-ray   New Medications:  No orders of the defined types were placed in this encounter.     Mordecai Rasmussen, MD  11/27/2022 4:13 PM

## 2022-12-06 NOTE — Telephone Encounter (Signed)
Received notification from [place] regarding approval for [facility]. Patient assistance approved from 11/25/22 to 12/08/22.  Phone: [phone]

## 2022-12-10 ENCOUNTER — Telehealth: Payer: Self-pay | Admitting: Physical Medicine and Rehabilitation

## 2022-12-10 ENCOUNTER — Encounter: Payer: PPO | Admitting: Physical Medicine and Rehabilitation

## 2022-12-10 NOTE — Telephone Encounter (Signed)
Spoke with patient and rescheduled appointment for 12/15/22

## 2022-12-10 NOTE — Telephone Encounter (Signed)
Patient called. She would like to RSC her appointment with Dr. Newton 

## 2022-12-14 ENCOUNTER — Telehealth: Payer: Self-pay | Admitting: Physical Medicine and Rehabilitation

## 2022-12-14 NOTE — Telephone Encounter (Signed)
Patient wants tgo cx for tomorrow

## 2022-12-14 NOTE — Telephone Encounter (Signed)
Spoke with patient and cancelled appointment. Stated she will call back to reschedule

## 2022-12-15 ENCOUNTER — Telehealth: Payer: Self-pay

## 2022-12-15 ENCOUNTER — Encounter: Payer: PPO | Admitting: Physical Medicine and Rehabilitation

## 2022-12-15 NOTE — Telephone Encounter (Signed)
Called and confirmed with pt for samples - 4 boxes have been placed up front for pt  Rybelsus 14mg 

## 2022-12-16 NOTE — Telephone Encounter (Signed)
Received notification from [place] regarding approval for RYBELSUS 14MG . Patient assistance approved from 12/07/22 to 12/01/23.  Phone: [phone]

## 2022-12-23 ENCOUNTER — Encounter: Payer: Self-pay | Admitting: *Deleted

## 2022-12-29 ENCOUNTER — Ambulatory Visit (INDEPENDENT_AMBULATORY_CARE_PROVIDER_SITE_OTHER): Payer: HMO

## 2022-12-29 VITALS — Ht 62.0 in | Wt 280.0 lb

## 2022-12-29 DIAGNOSIS — Z0000 Encounter for general adult medical examination without abnormal findings: Secondary | ICD-10-CM

## 2022-12-29 DIAGNOSIS — Z122 Encounter for screening for malignant neoplasm of respiratory organs: Secondary | ICD-10-CM

## 2022-12-29 DIAGNOSIS — Z1231 Encounter for screening mammogram for malignant neoplasm of breast: Secondary | ICD-10-CM

## 2022-12-29 NOTE — Patient Instructions (Signed)
[name] , Thank you for taking time to come for your Medicare Wellness Visit. I appreciate your ongoing commitment to your health goals. Please review the following plan we discussed and let me know if I can assist you in the future.   These are the goals we discussed:  Goals       AWV      12/25/2020 AWV Goal: Diabetes Management  Patient will maintain an A1C level below 8.0 Patient will not develop any diabetic foot complications Patient will not experience any hypoglycemic episodes over the next 3 months Patient will notify our office of any CBG readings outside of the provider recommended range by calling [phone] Patient will adhere to provider recommendations for diabetes management  Patient Self Management Activities take all medications as prescribed and report any negative side effects monitor and record blood sugar readings as directed adhere to a low carbohydrate diet that incorporates lean proteins, vegetables, whole grains, low glycemic fruits check feet daily noting any sores, cracks, injuries, or callous formations see PCP or podiatrist if she notices any changes in her legs, feet, or toenails Patient will visit PCP and have an A1C level checked every 3 to 6 months as directed  have a yearly eye exam to monitor for vascular changes associated with diabetes and will request that the report be sent to her pcp.  consult with her PCP regarding any changes in her health or new or worsening symptoms  12/25/2020 AWV Goal: Exercise for General Health  Patient will verbalize understanding of the benefits of increased physical activity: Exercising regularly is important. It will improve your overall fitness, flexibility, and endurance. Regular exercise also will improve your overall health. It can help you control your weight, reduce stress, and improve your bone density. Over the next year, patient will increase physical activity as tolerated with a goal of at least 150  minutes of moderate physical activity per week.  You can tell that you are exercising at a moderate intensity if your heart starts beating faster and you start breathing faster but can still hold a conversation. Moderate-intensity exercise ideas include: Walking 1 mile (1.6 km) in about 15 minutes Biking Hiking Golfing Dancing Water aerobics Patient will verbalize understanding of everyday activities that increase physical activity by providing examples like the following: Yard work, such as: Sales promotion account executive Gardening Washing windows or floors Patient will be able to explain general safety guidelines for exercising:  Before you start a new exercise program, talk with your health care provider. Do not exercise so much that you hurt yourself, feel dizzy, or get very short of breath. Wear comfortable clothes and wear shoes with good support. Drink plenty of water while you exercise to prevent dehydration or heat stroke. Work out until your breathing and your heartbeat get faster.       DIET - INCREASE WATER INTAKE      Try to drink 6-8 glasses of water daily      DIET - INCREASE WATER INTAKE      Exercise      Increase exercise- start by walking 2 minutes around house 3 times per day, gradually increasing to a total of walking 20-30 minutes per day      Quit smoking / using tobacco      T2DM-PharmD goal (pt-stated)      Current Barriers:  Unable to independently afford treatment regimen Unable to achieve control  of T2DM  Suboptimal therapeutic regimen for T2DM  Pharmacist Clinical Goal(s):  Over the next 90 days, patient will verbalize ability to afford treatment regimen achieve adherence to monitoring guidelines and medication adherence to achieve therapeutic efficacy achieve control of T2DM as evidenced by GOAL A1C through collaboration with PharmD and provider.   Interventions: 1:1  collaboration with Stacey Norlander, DO regarding development and update of comprehensive plan of care as evidenced by provider attestation and co-signature Inter-disciplinary care team collaboration (see longitudinal plan of care) Comprehensive medication review performed; medication list updated in electronic medical record  Diabetes: Uncontrolled--A1C 8.9%, GFR 44; current treatment: BASAGLAR 40 units, METFORMIN, increase RYBELSUS to 68m Patient to have a1c checked next week, increasing dose for glycemic control (decrease in insulin) & hopeful weight loss Insulin requirements have increased Patient gets via [facility] patient assistnace program Increasing rybelsus to 120mdaily as tolerated Denies personal and family history of Medullary thyroid cancer ([place] enrolled in novo nordisk patient assistance program Additional samples given to patient  Current glucose readings: fasting glucose: <160, post prandial glucose: <200 Denies hypoglycemic/hyperglycemic symptoms Current meal patterns: Discussed meal planning options and Plate Method for healthy eating Avoid sugary drinks and desserts Incorporate balanced protein, non starchy veggies, 1 serving of carbohydrate with each meal Increase water intake Increase physical activity as able Current exercise: unable due to pain; difficulty getting around; uses cane to walk Educated on medications; lifestyle modifications Recommended improve diet Financial-re-enrolled patient in [employer] patient assistance for [employer] novo nordisk PAP for Rybelsus FOR 2023  Hypertension -patient reports BP control is better (briefly discussed), compliant with medicine  Hyperlipidemia -LDL <70 at goal, continue current medication (crestor) Lipid Panel     Component Value Date/Time   CHOL 91 (L) 08/08/2020 1035   TRIG 134 08/08/2020 1035   TRIG 211 (H) 12/30/2014 1537   HDL 36 (L) 08/08/2020 1035   HDL 48 12/30/2014 1537    CHOLHDL 2.5 08/08/2020 1035   CHOLHDL 4.5 04/09/2013 1135   VLDL 37 04/09/2013 1135   LDLCALC 31 08/08/2020 1035   LDLCALC 42 08/28/2014 1037   LABVLDL 24 08/08/2020 1035    Patient Goals/Self-Care Activities Over the next 90 days, patient will:  - take medications as prescribed focus on medication adherence by REDUCING A1C TO GOAL <7% check glucose DAILY (FASTING), document, and provide at future appointments  Follow Up Plan: Telephone follow up appointment with care management team member scheduled for: 2 months         This is a list of the screening recommended for you and due dates:  Health Maintenance  Topic Date Due   COVID-19 Vaccine (1) Never done   DTaP/Tdap/Td vaccine (1 - Tdap) Never done   Screening for Lung Cancer  Never done   Zoster (Shingles) Vaccine (1 of 2) Never done   Yearly kidney health urinalysis for diabetes  06/16/2019   Eye exam for diabetics  12/26/2019   Flu Shot  02/06/2023*   Colon Cancer Screening  08/07/2023*   Complete foot exam   01/13/2023   Mammogram  02/02/2023   Hemoglobin A1C  02/08/2023   Yearly kidney function blood test for diabetes  06/10/2023   Medicare Annual Wellness Visit  12/30/2023   Hepatitis C Screening: USPSTF Recommendation to screen - Ages 18-79 yo.  Completed   HIV Screening  Completed   HPV Vaccine  Aged Out   Pap Smear  Discontinued  *Topic was postponed. The date shown  is not the original due date.    Advanced directives: Advance directive discussed with you today. I have provided a copy for you to complete at home and have notarized. Once this is complete please bring a copy in to our office so we can scan it into your chart.   Conditions/risks identified: Aim for 30 minutes of exercise or brisk walking, 6-8 glasses of water, and 5 servings of fruits and vegetables each day.   Next appointment: Follow up in one year for your annual wellness visit.   Preventive Care 40-64 Years, Female Preventive care refers  to lifestyle choices and visits with your health care provider that can promote health and wellness. What does preventive care include? A yearly physical exam. This is also called an annual well check. Dental exams once or twice a year. Routine eye exams. Ask your health care provider how often you should have your eyes checked. Personal lifestyle choices, including: Daily care of your teeth and gums. Regular physical activity. Eating a healthy diet. Avoiding tobacco and drug use. Limiting alcohol use. Practicing safe sex. Taking low-dose aspirin daily starting at age 63. Taking vitamin and mineral supplements as recommended by your health care provider. What happens during an annual well check? The services and screenings done by your health care provider during your annual well check will depend on your age, overall health, lifestyle risk factors, and family history of disease. Counseling  Your health care provider may ask you questions about your: Alcohol use. Tobacco use. Drug use. Emotional well-being. Home and relationship well-being. Sexual activity. Eating habits. Work and work Statistician. Method of birth control. Menstrual cycle. Pregnancy history. Screening  You may have the following tests or measurements: Height, weight, and BMI. Blood pressure. Lipid and cholesterol levels. These may be checked every 5 years, or more frequently if you are over 56 years old. Skin check. Lung cancer screening. You may have this screening every year starting at age 10 if you have a 30-pack-year history of smoking and currently smoke or have quit within the past 15 years. Fecal occult blood test (FOBT) of the stool. You may have this test every year starting at age 39. Flexible sigmoidoscopy or colonoscopy. You may have a sigmoidoscopy every 5 years or a colonoscopy every 10 years starting at age 78. Hepatitis C blood test. Hepatitis B blood test. Sexually transmitted disease (STD)  testing. Diabetes screening. This is done by checking your blood sugar (glucose) after you have not eaten for a while (fasting). You may have this done every 1-3 years. Mammogram. This may be done every 1-2 years. Talk to your health care provider about when you should start having regular mammograms. This may depend on whether you have a family history of breast cancer. BRCA-related cancer screening. This may be done if you have a family history of breast, ovarian, tubal, or peritoneal cancers. Pelvic exam and Pap test. This may be done every 3 years starting at age 58. Starting at age 52, this may be done every 5 years if you have a Pap test in combination with an HPV test. Bone density scan. This is done to screen for osteoporosis. You may have this scan if you are at high risk for osteoporosis. Discuss your test results, treatment options, and if necessary, the need for more tests with your health care provider. Vaccines  Your health care provider may recommend certain vaccines, such as: Influenza vaccine. This is recommended every year. Tetanus, diphtheria, and acellular pertussis (Tdap,  Td) vaccine. You may need a Td booster every 10 years. Zoster vaccine. You may need this after age 1. Pneumococcal 13-valent conjugate (PCV13) vaccine. You may need this if you have certain conditions and were not previously vaccinated. Pneumococcal polysaccharide (PPSV23) vaccine. You may need one or two doses if you smoke cigarettes or if you have certain conditions. Talk to your health care provider about which screenings and vaccines you need and how often you need them. This information is not intended to replace advice given to you by your health care provider. Make sure you discuss any questions you have with your health care provider. Document Released: 11/21/2015 Document Revised: 07/14/2016 Document Reviewed: 08/26/2015 Elsevier Interactive Patient Education  2017 [employer]  Prevention in the Home Falls can cause injuries. They can happen to people of all ages. There are many things you can do to make your home safe and to help prevent falls. What can I do on the outside of my home? Regularly fix the edges of walkways and driveways and fix any cracks. Remove anything that might make you trip as you walk through a door, such as a raised step or threshold. Trim any bushes or trees on the path to your home. Use bright outdoor lighting. Clear any walking paths of anything that might make someone trip, such as rocks or tools. Regularly check to see if handrails are loose or broken. Make sure that both sides of any steps have handrails. Any raised decks and porches should have guardrails on the edges. Have any leaves, snow, or ice cleared regularly. Use sand or salt on walking paths during winter. Clean up any spills in your garage right away. This includes oil or grease spills. What can I do in the bathroom? Use night lights. Install grab bars by the toilet and in the tub and shower. Do not use towel bars as grab bars. Use non-skid mats or decals in the tub or shower. If you need to sit down in the shower, use a plastic, non-slip stool. Keep the floor dry. Clean up any water that spills on the floor as soon as it happens. Remove soap buildup in the tub or shower regularly. Attach bath mats securely with double-sided non-slip rug tape. Do not have throw rugs and other things on the floor that can make you trip. What can I do in the bedroom? Use night lights. Make sure that you have a light by your bed that is easy to reach. Do not use any sheets or blankets that are too big for your bed. They should not hang down onto the floor. Have a firm chair that has side arms. You can use this for support while you get dressed. Do not have throw rugs and other things on the floor that can make you trip. What can I do in the kitchen? Clean up any spills right away. Avoid  walking on wet floors. Keep items that you use a lot in easy-to-reach places. If you need to reach something above you, use a strong step stool that has a grab bar. Keep electrical cords out of the way. Do not use floor polish or wax that makes floors slippery. If you must use wax, use non-skid floor wax. Do not have throw rugs and other things on the floor that can make you trip. What can I do with my stairs? Do not leave any items on the stairs. Make sure that there are handrails on both sides  of the stairs and use them. Fix handrails that are broken or loose. Make sure that handrails are as long as the stairways. Check any carpeting to make sure that it is firmly attached to the stairs. Fix any carpet that is loose or worn. Avoid having throw rugs at the top or bottom of the stairs. If you do have throw rugs, attach them to the floor with carpet tape. Make sure that you have a light switch at the top of the stairs and the bottom of the stairs. If you do not have them, ask someone to add them for you. What else can I do to help prevent falls? Wear shoes that: Do not have high heels. Have rubber bottoms. Are comfortable and fit you well. Are closed at the toe. Do not wear sandals. If you use a stepladder: Make sure that it is fully opened. Do not climb a closed stepladder. Make sure that both sides of the stepladder are locked into place. Ask someone to hold it for you, if possible. Clearly mark and make sure that you can see: Any grab bars or handrails. First and last steps. Where the edge of each step is. Use tools that help you move around (mobility aids) if they are needed. These include: Canes. Walkers. Scooters. Crutches. Turn on the lights when you go into a dark area. Replace any light bulbs as soon as they burn out. Set up your furniture so you have a clear path. Avoid moving your furniture around. If any of your floors are uneven, fix them. If there are any pets around  you, be aware of where they are. Review your medicines with your doctor. Some medicines can make you feel dizzy. This can increase your chance of falling. Ask your doctor what other things that you can do to help prevent falls. This information is not intended to replace advice given to you by your health care provider. Make sure you discuss any questions you have with your health care provider. Document Released: 08/21/2009 Document Revised: 04/01/2016 Document Reviewed: 11/29/2014 Elsevier Interactive Patient Education  2017 [employer].

## 2022-12-29 NOTE — Progress Notes (Addendum)
Subjective:   [name] is a 62 y.o. female who presents for Medicare Annual (Subsequent) preventive examination. I connected with  [name] on 12/29/22 by a audio enabled telemedicine application and verified that I am speaking with the correct person using two identifiers.  Patient Location: Home  Provider Location: Home Office  I discussed the limitations of evaluation and management by telemedicine. The patient expressed understanding and agreed to proceed.  Review of Systems     Cardiac Risk Factors include: advanced age (>72mn, >>69women);diabetes mellitus;hypertension;dyslipidemia     Objective:    Today's Vitals   12/29/22 0822  Weight: 280 lb (127 kg)  Height: '5\' 2"'$  (1.575 m)   Body mass index is 51.21 kg/m.     12/29/2022    8:25 AM 05/10/2022    8:42 AM 12/28/2021    8:38 AM 12/25/2020    8:30 AM 09/23/2020   11:54 AM 06/18/2020    9:00 PM 06/18/2020   12:05 PM  Advanced Directives  Does Patient Have a Medical Advance Directive? No No No No No No No  Would patient like information on creating a medical advance directive? No - Patient declined  No - Patient declined No - Patient declined No - Patient declined No - Patient declined     Current Medications (verified) Outpatient Encounter Medications as of 12/29/2022  Medication Sig   acetaminophen (TYLENOL) 325 MG tablet Take 975 mg by mouth every 8 (eight) hours as needed.   albuterol (VENTOLIN HFA) 108 (90 Base) MCG/ACT inhaler Inhale 2 puffs into the lungs every 4 (four) hours as needed for wheezing or shortness of breath (for rescue).   Alpha-Lipoic Acid 600 MG CAPS Take 1 capsule (600 mg total) by mouth daily. For diabetic neuropathy   aspirin EC 81 MG tablet Take 1 tablet (81 mg total) by mouth daily.   Blood Glucose Monitoring Suppl (ONETOUCH VERIO) w/Device KIT Use to test blood sugar twice daily as directed; DX: E11.69   Continuous Blood Gluc Sensor (FREESTYLE LIBRE 3 SENSOR) MISC Place 1 sensor on  the skin every 14 days. Use to check glucose continuously; DX:E11.65   diclofenac Sodium (VOLTAREN) 1 % GEL Apply 2 g topically 4 (four) times daily. For pain   furosemide (LASIX) 40 MG tablet TAKE 1 TABLET BY MOUTH ONCE DAILY AS NEEDED FOR EDEMA OR FLUID   gabapentin (NEURONTIN) 600 MG tablet TAKE 1 & 1/2 (ONE & ONE-HALF) TABLETS BY MOUTH THREE TIMES DAILY   glimepiride (AMARYL) 2 MG tablet Take 2 mg by mouth daily.   glucose blood (ONETOUCH VERIO) test strip Use to test blood sugar twice daily as directed; DX: E11.69   Lidocaine 3.5 % LOTN Apply 1 application  topically as needed.   methocarbamol (ROBAXIN) 500 MG tablet Take 1 tablet (500 mg total) by mouth every 6 (six) hours as needed for muscle spasms.   metoprolol tartrate (LOPRESSOR) 25 MG tablet Take 1 tablet (25 mg total) by mouth 2 (two) times daily.   nitroGLYCERIN (NITROSTAT) 0.4 MG SL tablet Place 1 tablet (0.4 mg total) under the tongue every 5 (five) minutes as needed for chest pain.   omeprazole (PRILOSEC) 40 MG capsule Take 1 capsule (40 mg total) by mouth daily. For heartburn   OneTouch Delica Lancets 399991111MISC Use to test blood sugar twice daily as directed; DX: EXX123456  OneTouch Delica Lancets 399991111MISC Check blood sugar BID and prn  E11.9   rosuvastatin (CRESTOR) 20 MG tablet  Take 1 tablet (20 mg total) by mouth at bedtime.   Semaglutide (RYBELSUS) 7 MG TABS Take 14 mg by mouth daily.   sertraline (ZOLOFT) 50 MG tablet Take 1 tablet (50 mg total) by mouth daily. Daily for depression/ anxiety   telmisartan (MICARDIS) 40 MG tablet Take 1 tablet (40 mg total) by mouth daily.   Insulin Glargine (BASAGLAR KWIKPEN) 100 UNIT/ML Inject 60 Units into the skin at bedtime. (Patient taking differently: Inject 40 Units into the skin at bedtime.)   No facility-administered encounter medications on file as of 12/29/2022.    Allergies (verified) Duloxetine, Farxiga [dapagliflozin], and Morphine and related   History: Past Medical History:   Diagnosis Date   Arthritis    CAD (coronary artery disease)    BMS to circumflex 2007 - Dr. Olevia Perches   CKD (chronic kidney disease) stage 3, GFR 30-59 ml/min ([place])    Essential hypertension    Falls frequently    GERD (gastroesophageal reflux disease)    HA (headache)    Hyperlipidemia    Hyperlipidemia associated with type 2 diabetes mellitus ([place]) 02/03/2011   Left-sided face pain    Morbid obesity ([place])    Noncompliance    OSA (obstructive sleep apnea)    Uses CPAP   Type 2 diabetes mellitus ([place])    Urinary incontinence    Past Surgical History:  Procedure Laterality Date   ABDOMINAL HERNIA REPAIR     ABDOMINAL HYSTERECTOMY     BACK SURGERY     BREAST SURGERY     biopsy per right breast; pt states has silver piece in for marking    Cataract surgery Bilateral    CHOLECYSTECTOMY     COLONOSCOPY N/A 04/10/2013   Procedure: COLONOSCOPY;  Surgeon: Rogene Houston, MD;  Location: [facility];  Service: Endoscopy;  Laterality: N/A;   CORONARY ANGIOPLASTY WITH STENT PLACEMENT  2012   LEFT HEART CATH AND CORONARY ANGIOGRAPHY N/A 06/19/2020   Procedure: LEFT HEART CATH AND CORONARY ANGIOGRAPHY;  Surgeon: Belva Crome, MD;  Location: [facility];  Service: Cardiovascular;  Laterality: N/A;   LUMBAR LAMINECTOMY/DECOMPRESSION MICRODISCECTOMY N/A 04/25/2015   Procedure: CENTRAL LUMBAR /DECOMPRESSION L4-L5;  Surgeon: Latanya Maudlin, MD;  Location: [facility];  Service: Orthopedics;  Laterality: N/A;   TUBAL LIGATION     Family History  Problem Relation Age of Onset   Breast cancer Mother    Cancer Father    Coronary artery disease Father    Cancer - Colon Father    Diabetes Father    High Cholesterol Father    Arthritis Sister    Asthma Sister    High Cholesterol Daughter    Thyroid disease Daughter    Hiatal hernia Son    Congenital heart disease Son    Social History   Socioeconomic History   Marital status: Married    Spouse name: Gwyndolyn Saxon   Number of children: 5    Years of education: 10 th   Highest education level: 10th grade  Occupational History   Occupation: disabled     Comment: disabled  Tobacco Use   Smoking status: Every Day    Packs/day: 1.00    Years: 47.00    Total pack years: 47.00    Types: Cigarettes    Start date: 05/14/1972   Smokeless tobacco: Never  Vaping Use   Vaping Use: Never used  Substance and Sexual Activity   Alcohol use: No    Alcohol/week: 0.0 standard drinks of alcohol  Drug use: No   Sexual activity: Not Currently    Birth control/protection: Surgical  Other Topics Concern   Not on file  Social History Narrative   Patient lives at home with her husband. Patient is disabled.   Children live nearby   Patient has 10 th grade education.   Right handed.   Caffeine- one  cup of coffee and  One soda Dr.Pepper/ tea. daily   Social Determinants of Health   Financial Resource Strain: Low Risk  (12/29/2022)   Overall Financial Resource Strain (CARDIA)    Difficulty of Paying Living Expenses: Not hard at all  Food Insecurity: No Food Insecurity (12/29/2022)   Hunger Vital Sign    Worried About Running Out of Food in the Last Year: Never true    Ran Out of Food in the Last Year: Never true  Transportation Needs: No Transportation Needs (12/29/2022)   PRAPARE - Hydrologist (Medical): No    Lack of Transportation (Non-Medical): No  Physical Activity: Inactive (12/29/2022)   Exercise Vital Sign    Days of Exercise per Week: 0 days    Minutes of Exercise per Session: 0 min  Stress: No Stress Concern Present (12/29/2022)   [place]    Feeling of Stress : Only a little  Social Connections: Moderately Integrated (12/29/2022)   Social Connection and Isolation Panel [NHANES]    Frequency of Communication with Friends and Family: More than three times a week    Frequency of Social Gatherings with Friends and Family: More than  three times a week    Attends Religious Services: More than 4 times per year    Active Member of [employer] or Organizations: No    Attends Music therapist: Never    Marital Status: Married    Tobacco Counseling Ready to quit: No Counseling given: Not Answered   Clinical Intake:  Pre-visit preparation completed: Yes  Pain : No/denies pain     Nutritional Risks: None Diabetes: Yes CBG done?: No Did pt. bring in CBG monitor from home?: No  How often do you need to have someone help you when you read instructions, pamphlets, or other written materials from your doctor or pharmacy?: 1 - Never  Diabetic?yes  Nutrition Risk Assessment:  Has the patient had any N/V/D within the last 2 months?  No  Does the patient have any non-healing wounds?  No  Has the patient had any unintentional weight loss or weight gain?  No   Diabetes:  Is the patient diabetic?  Yes  If diabetic, was a CBG obtained today?  No  Did the patient bring in their glucometer from home?  No  How often do you monitor your CBG's? Libre.   Financial Strains and Diabetes Management:  Are you having any financial strains with the device, your supplies or your medication? No .  Does the patient want to be seen by Chronic Care Management for management of their diabetes?  No  Would the patient like to be referred to a Nutritionist or for Diabetic Management?  No   Diabetic Exams:  Diabetic Eye Exam: Completed 01/2022 Diabetic Foot Exam: Overdue, Pt has been advised about the importance in completing this exam. Pt is scheduled for diabetic foot exam on next office visit .   Interpreter Needed?: No  Information entered by :: Jadene Pierini, LPN   Activities of Daily Living    12/29/2022  8:26 AM  In your present state of health, do you have any difficulty performing the following activities:  Hearing? 0  Vision? 0  Difficulty concentrating or making decisions? 0  Walking or climbing stairs?  0  Dressing or bathing? 0  Doing errands, shopping? 0  Preparing Food and eating ? N  Using the Toilet? N  In the past six months, have you accidently leaked urine? N  Do you have problems with loss of bowel control? N  Managing your Medications? N  Managing your Finances? N  Housekeeping or managing your Housekeeping? N    Patient Care Team: Janora Norlander, DO as PCP - General (Family Medicine) Satira Sark, MD as PCP - Cardiology (Cardiology) Marcial Pacas, MD as Consulting Physician (Neurology) Latanya Maudlin, MD as Consulting Physician (Orthopedic Surgery) Satira Sark, MD as Consulting Physician (Cardiology) Lavera Guise, [facility] (Pharmacist)  Indicate any recent Medical Services you may have received from other than [facility] providers in the past year (date may be approximate).     Assessment:   This is a routine wellness examination for Glorene.  Hearing/Vision screen Vision Screening - Comments:: Wears rx glasses - up to date with routine eye exams with  Dr.johnson   Dietary issues and exercise activities discussed: Current Exercise Habits: The patient does not participate in regular exercise at present   Goals Addressed             This Visit's Progress    DIET - INCREASE WATER INTAKE         Depression Screen    12/29/2022    8:25 AM 10/08/2022   10:07 AM 08/06/2022   10:29 AM 04/06/2022    3:52 PM 01/12/2022    3:27 PM 12/28/2021    8:34 AM 06/12/2021    3:24 PM  PHQ 2/9 Scores  PHQ - 2 Score 0 5 5 0 '5 4 6  '$ PHQ- 9 Score  '12 15  19 8 19    '$ Fall Risk    12/29/2022    8:23 AM 10/08/2022   10:07 AM 08/06/2022   10:29 AM 04/06/2022    3:52 PM 12/28/2021    8:27 AM  Fall Risk   Falls in the past year? 0 0 0 0 0  Number falls in past yr: 0    0  Injury with Fall? 0    0  Risk for fall due to : No Fall Risks    Impaired balance/gait;Medication side effect  Follow up Falls prevention discussed    Falls prevention discussed;Education provided     FALL RISK PREVENTION PERTAINING TO THE HOME:  Any stairs in or around the home? No  If so, are there any without handrails? No  Home free of loose throw rugs in walkways, pet beds, electrical cords, etc? Yes  Adequate lighting in your home to reduce risk of falls? Yes   ASSISTIVE DEVICES UTILIZED TO PREVENT FALLS:  Life alert? No  Use of a cane, walker or w/c? Yes  Grab bars in the bathroom? Yes  Shower chair or bench in shower? Yes  Elevated toilet seat or a handicapped toilet? Yes       11/27/2018    1:21 PM 11/23/2017   10:10 AM  MMSE - Mini Mental State Exam  Orientation to time 5 5  Orientation to Place 5 5  Registration 3 3  Attention/ Calculation 5 5  Recall 3 2  Language- name 2 objects 2 2  Language-  repeat 1 1  Language- follow 3 step command 3 3  Language- read & follow direction 1 1  Write a sentence 1 1  Copy design 1 1  Total score 30 29        12/29/2022    8:26 AM 12/28/2021    8:41 AM 12/25/2020    8:35 AM 12/21/2019    9:00 AM  6CIT Screen  What Year? 0 points 0 points 0 points 0 points  What month? 0 points 0 points 0 points 0 points  What time? 0 points 0 points 0 points 0 points  Count back from 20 0 points 0 points 0 points 0 points  Months in reverse 0 points 4 points 4 points 0 points  Repeat phrase 0 points 2 points 2 points 0 points  Total Score 0 points 6 points 6 points 0 points    Immunizations Immunization History  Administered Date(s) Administered   Influenza,inj,Quad PF,6+ Mos 08/10/2013, 08/28/2014, 09/25/2015, 11/23/2017, 11/27/2018   Pneumococcal Conjugate-13 11/27/2018    TDAP status: Due, Education has been provided regarding the importance of this vaccine. Advised may receive this vaccine at local pharmacy or Health Dept. Aware to provide a copy of the vaccination record if obtained from local pharmacy or Health Dept. Verbalized acceptance and understanding.  Flu Vaccine status: Declined, Education has been provided  regarding the importance of this vaccine but patient still declined. Advised may receive this vaccine at local pharmacy or Health Dept. Aware to provide a copy of the vaccination record if obtained from local pharmacy or Health Dept. Verbalized acceptance and understanding.  Pneumococcal vaccine status: Due, Education has been provided regarding the importance of this vaccine. Advised may receive this vaccine at local pharmacy or Health Dept. Aware to provide a copy of the vaccination record if obtained from local pharmacy or Health Dept. Verbalized acceptance and understanding.  Covid-19 vaccine status: Completed vaccines  Qualifies for Shingles Vaccine? Yes   Zostavax completed No   Shingrix Completed?: No.    Education has been provided regarding the importance of this vaccine. Patient has been advised to call insurance company to determine out of pocket expense if they have not yet received this vaccine. Advised may also receive vaccine at local pharmacy or Health Dept. Verbalized acceptance and understanding.  Screening Tests Health Maintenance  Topic Date Due   COVID-19 Vaccine (1) Never done   DTaP/Tdap/Td (1 - Tdap) Never done   Lung Cancer Screening  Never done   Zoster Vaccines- Shingrix (1 of 2) Never done   Diabetic kidney evaluation - Urine ACR  06/16/2019   OPHTHALMOLOGY EXAM  12/26/2019   INFLUENZA VACCINE  02/06/2023 (Originally 06/08/2022)   COLONOSCOPY (Pts 45-58yr Insurance coverage will need to be confirmed)  08/07/2023 (Originally 04/10/2018)   FOOT EXAM  01/13/2023   MAMMOGRAM  02/02/2023   HEMOGLOBIN A1C  02/08/2023   Diabetic kidney evaluation - eGFR measurement  06/10/2023   Medicare Annual Wellness (AWV)  12/30/2023   Hepatitis C Screening  Completed   HIV Screening  Completed   HPV VACCINES  Aged Out   PAP SMEAR-Modifier  Discontinued    Health Maintenance  Health Maintenance Due  Topic Date Due   COVID-19 Vaccine (1) Never done   DTaP/Tdap/Td (1 - Tdap)  Never done   Lung Cancer Screening  Never done   Zoster Vaccines- Shingrix (1 of 2) Never done   Diabetic kidney evaluation - Urine ACR  06/16/2019   OPHTHALMOLOGY EXAM  12/26/2019  Colorectal cancer screening: Referral to GI placed declined will discuss with PCP . Pt aware the office will call re: appt.  Mammogram status: Ordered 12/29/2022. Pt provided with contact info and advised to call to schedule appt.   Bone Density status: Ordered not of age . Pt provided with contact info and advised to call to schedule appt.  Lung Cancer Screening: (Low Dose CT Chest recommended if Age 80-80 years, 30 pack-year currently smoking OR have quit w/in 15years.) does qualify.   Lung Cancer Screening Referral: 12/29/2022  Additional Screening:  Hepatitis C Screening: does not qualify;   Vision Screening: Recommended annual ophthalmology exams for early detection of glaucoma and other disorders of the eye. Is the patient up to date with their annual eye exam?  Yes  Who is the provider or what is the name of the office in which the patient attends annual eye exams? Dr.Lee  If pt is not established with a provider, would they like to be referred to a provider to establish care? No .   Dental Screening: Recommended annual dental exams for proper oral hygiene  Community Resource Referral / Chronic Care Management: CRR required this visit?  No   CCM required this visit?  No      Plan:     I have personally reviewed and noted the following in the patient's chart:   Medical and social history Use of alcohol, tobacco or illicit drugs  Current medications and supplements including opioid prescriptions. Patient is not currently taking opioid prescriptions. Functional ability and status Nutritional status Physical activity Advanced directives List of other physicians Hospitalizations, surgeries, and ER visits in previous 12 months Vitals Screenings to include cognitive, depression, and  falls Referrals and appointments  In addition, I have reviewed and discussed with patient certain preventive protocols, quality metrics, and best practice recommendations. A written personalized care plan for preventive services as well as general preventive health recommendations were provided to patient.     Daphane Shepherd, LPN   [id number]   Nurse Notes: Due TDAP/Pneumonia Vaccine  Declines Colonoscopy will discuss with PCP

## 2023-02-11 ENCOUNTER — Telehealth: Payer: Self-pay | Admitting: Physical Medicine and Rehabilitation

## 2023-02-11 NOTE — Telephone Encounter (Signed)
Patient need to schedule appointment with Dr. Alvester Morin

## 2023-02-14 NOTE — Telephone Encounter (Signed)
Spoke with patient and scheduled NCV for 02/25/23.

## 2023-02-25 ENCOUNTER — Ambulatory Visit (INDEPENDENT_AMBULATORY_CARE_PROVIDER_SITE_OTHER): Payer: PPO | Admitting: Physical Medicine and Rehabilitation

## 2023-02-25 DIAGNOSIS — R202 Paresthesia of skin: Secondary | ICD-10-CM | POA: Diagnosis not present

## 2023-02-25 DIAGNOSIS — M79642 Pain in left hand: Secondary | ICD-10-CM | POA: Diagnosis not present

## 2023-02-25 DIAGNOSIS — R531 Weakness: Secondary | ICD-10-CM

## 2023-02-25 NOTE — Progress Notes (Unsigned)
Functional Pain Scale - descriptive words and definitions  Unmanageable (7)  Pain interferes with normal ADL's/nothing seems to help/sleep is very difficult/active distractions are very difficult to concentrate on. Severe range order  Average Pain  varies  Right handed. Numbness, pain, weakness and throbbing in left hand and wrist. Hard to grasp things

## 2023-02-26 DIAGNOSIS — L03116 Cellulitis of left lower limb: Secondary | ICD-10-CM | POA: Diagnosis not present

## 2023-02-26 DIAGNOSIS — I890 Lymphedema, not elsewhere classified: Secondary | ICD-10-CM | POA: Diagnosis not present

## 2023-02-26 DIAGNOSIS — R600 Localized edema: Secondary | ICD-10-CM | POA: Diagnosis not present

## 2023-03-02 ENCOUNTER — Encounter: Payer: Self-pay | Admitting: Physical Medicine and Rehabilitation

## 2023-03-02 NOTE — Addendum Note (Signed)
Addended by: Ashok Norris on: 03/02/2023 08:36 AM   Modules accepted: Level of Service

## 2023-03-02 NOTE — Procedures (Signed)
EMG & NCV Findings: Evaluation of the left median motor nerve showed prolonged distal onset latency (9.0 ms) and decreased conduction velocity (Elbow-Wrist, 45 m/s).  The left median (across palm) sensory nerve showed prolonged distal peak latency (Wrist, 7.0 ms), reduced amplitude (3.6 V), and prolonged distal peak latency (Palm, 6.2 ms).  The left ulnar sensory nerve showed reduced amplitude (2.7 V).  All remaining nerves (as indicated in the following tables) were within normal limits.    All examined muscles (as indicated in the following table) showed no evidence of electrical instability.    Impression: The above electrodiagnostic study is ABNORMAL and reveals evidence of a severe left median nerve entrapment at the wrist (carpal tunnel syndrome) affecting sensory and motor components.   There is no significant electrodiagnostic evidence of any other focal nerve entrapment, brachial plexopathy or cervical radiculopathy.   Recommendations: 1.  Follow-up with referring physician. 2.  Continue current management of symptoms.  Consider surgical decompression.  ___________________________ Naaman Plummer FAAPMR Board Certified, American Board of Physical Medicine and Rehabilitation    Nerve Conduction Studies Anti Sensory Summary Table   Stim Site NR Peak (ms) Norm Peak (ms) P-T Amp (V) Norm P-T Amp Site1 Site2 Delta-P (ms) Dist (cm) Vel (m/s) Norm Vel (m/s)  Left Median Acr Palm Anti Sensory (2nd Digit)  32.3C  Wrist    *7.0 <3.6 *3.6 >10 Wrist Palm 0.8 0.0    Palm    *6.2 <2.0 10.8         Left Radial Anti Sensory (Base 1st Digit)  32.4C  Wrist    1.8 <3.1 10.7  Wrist Base 1st Digit 1.8 0.0    Left Ulnar Anti Sensory (5th Digit)  32.6C  Wrist    3.6 <3.7 *2.7 >15.0 Wrist 5th Digit 3.6 14.0 39 >38   Motor Summary Table   Stim Site NR Onset (ms) Norm Onset (ms) O-P Amp (mV) Norm O-P Amp Site1 Site2 Delta-0 (ms) Dist (cm) Vel (m/s) Norm Vel (m/s)  Left Median Motor (Abd Poll  Brev)  32.3C  Wrist    *9.0 <4.2 5.8 >5 Elbow Wrist 4.2 19.0 *45 >50  Elbow    13.2  5.7         Left Ulnar Motor (Abd Dig Min)  32.2C  Wrist    3.8 <4.2 7.2 >3 B Elbow Wrist 3.6 19.0 53 >53  B Elbow    7.4  3.8  A Elbow B Elbow 1.7 11.0 65 >53  A Elbow    9.1  4.3          EMG   Side Muscle Nerve Root Ins Act Fibs Psw Amp Dur Poly Recrt Int Dennie Bible Comment  Left Abd Poll Brev Median C8-T1 Nml Nml Nml Nml Nml 0 Nml Nml   Left 1stDorInt Ulnar C8-T1 Nml Nml Nml Nml Nml 0 Nml Nml   Left PronatorTeres Median C6-7 Nml Nml Nml Nml Nml 0 Nml Nml   Left Biceps Musculocut C5-6 Nml Nml Nml Nml Nml 0 Nml Nml   Left Deltoid Axillary C5-6 Nml Nml Nml Nml Nml 0 Nml Nml     Nerve Conduction Studies Anti Sensory Left/Right Comparison   Stim Site L Lat (ms) R Lat (ms) L-R Lat (ms) L Amp (V) R Amp (V) L-R Amp (%) Site1 Site2 L Vel (m/s) R Vel (m/s) L-R Vel (m/s)  Median Acr Palm Anti Sensory (2nd Digit)  32.3C  Wrist *7.0   *3.6   Wrist Palm  Palm *6.2   10.8         Radial Anti Sensory (Base 1st Digit)  32.4C  Wrist 1.8   10.7   Wrist Base 1st Digit     Ulnar Anti Sensory (5th Digit)  32.6C  Wrist 3.6   *2.7   Wrist 5th Digit 39     Motor Left/Right Comparison   Stim Site L Lat (ms) R Lat (ms) L-R Lat (ms) L Amp (mV) R Amp (mV) L-R Amp (%) Site1 Site2 L Vel (m/s) R Vel (m/s) L-R Vel (m/s)  Median Motor (Abd Poll Brev)  32.3C  Wrist *9.0   5.8   Elbow Wrist *45    Elbow 13.2   5.7         Ulnar Motor (Abd Dig Min)  32.2C  Wrist 3.8   7.2   B Elbow Wrist 53    B Elbow 7.4   3.8   A Elbow B Elbow 65    A Elbow 9.1   4.3            Waveforms:

## 2023-03-02 NOTE — Progress Notes (Signed)
[name] - 62 y.o. female MRN [id number]  Date of birth: Dec 07, 1960  Office Visit Note: Visit Date: 02/25/2023 PCP: [name], DO Referred by: Oliver Barre, MD  Subjective: Chief Complaint  Patient presents with   Left Hand - Numbness, Pain, Weakness   HPI:  [name] is a 62 y.o. female who comes in today at the request of Dr. Thane Edu for evaluation and management of chronic, worsening and severe pain, numbness and tingling in the Left upper extremities.  Patient is Right hand dominant.  She reports many months of worsening symptoms but really chronic symptoms overall.  She saw her primary care physician back in December with similar complaints.  Has been using Tylenol.  Cannot use NSAIDs do to kidney issues.  She is diabetic with a history of diabetic neuropathy.  I do not see any prior electrodiagnostic studies however.  No right-sided complaints.  She gets some shoulder and arm pain but no frank radicular symptoms.  Some history of neck pain.  The pain is into the thumb worse with movement.  She does get paresthesias into the hand globally.  She does endorse weakness and dropping objects.  Dr. Karlton Lemon felt like she had some atrophy of the thenar eminence.   I spent more than 30 minutes speaking face-to-face with the patient with 50% of the time in counseling and discussing coordination of care.    Review of Systems  Musculoskeletal:  Positive for joint pain.  Neurological:  Positive for tingling and focal weakness.  All other systems reviewed and are negative.  Otherwise per HPI.  Assessment & Plan: Visit Diagnoses:    ICD-10-CM   1. Paresthesia of skin  R20.2 NCV with EMG (electromyography)    2. Weakness  R53.1     3. Pain in left hand  M79.642       Plan: Impression: The above electrodiagnostic study is ABNORMAL and reveals evidence of a severe left median nerve entrapment at the wrist (carpal tunnel syndrome) affecting sensory and motor components.    There is no significant electrodiagnostic evidence of any other focal nerve entrapment, brachial plexopathy or cervical radiculopathy.   Recommendations: 1.  Follow-up with referring physician. 2.  Continue current management of symptoms.  Consider surgical decompression.  Meds & Orders: No orders of the defined types were placed in this encounter.   Orders Placed This Encounter  Procedures   NCV with EMG (electromyography)    Follow-up: Return for Thane Edu, MD.   Procedures: No procedures performed  EMG & NCV Findings: Evaluation of the left median motor nerve showed prolonged distal onset latency (9.0 ms) and decreased conduction velocity (Elbow-Wrist, 45 m/s).  The left median (across palm) sensory nerve showed prolonged distal peak latency (Wrist, 7.0 ms), reduced amplitude (3.6 V), and prolonged distal peak latency (Palm, 6.2 ms).  The left ulnar sensory nerve showed reduced amplitude (2.7 V).  All remaining nerves (as indicated in the following tables) were within normal limits.    All examined muscles (as indicated in the following table) showed no evidence of electrical instability.    Impression: The above electrodiagnostic study is ABNORMAL and reveals evidence of a severe left median nerve entrapment at the wrist (carpal tunnel syndrome) affecting sensory and motor components.   There is no significant electrodiagnostic evidence of any other focal nerve entrapment, brachial plexopathy or cervical radiculopathy.   Recommendations: 1.  Follow-up with referring physician. 2.  Continue current management of symptoms.  Consider surgical decompression.  ___________________________ Naaman Plummer FAAPMR Board Certified, American Board of Physical Medicine and Rehabilitation    Nerve Conduction Studies Anti Sensory Summary Table   Stim Site NR Peak (ms) Norm Peak (ms) P-T Amp (V) Norm P-T Amp Site1 Site2 Delta-P (ms) Dist (cm) Vel (m/s) Norm Vel (m/s)  Left Median  Acr Palm Anti Sensory (2nd Digit)  32.3C  Wrist    *7.0 <3.6 *3.6 >10 Wrist Palm 0.8 0.0    Palm    *6.2 <2.0 10.8         Left Radial Anti Sensory (Base 1st Digit)  32.4C  Wrist    1.8 <3.1 10.7  Wrist Base 1st Digit 1.8 0.0    Left Ulnar Anti Sensory (5th Digit)  32.6C  Wrist    3.6 <3.7 *2.7 >15.0 Wrist 5th Digit 3.6 14.0 39 >38   Motor Summary Table   Stim Site NR Onset (ms) Norm Onset (ms) O-P Amp (mV) Norm O-P Amp Site1 Site2 Delta-0 (ms) Dist (cm) Vel (m/s) Norm Vel (m/s)  Left Median Motor (Abd Poll Brev)  32.3C  Wrist    *9.0 <4.2 5.8 >5 Elbow Wrist 4.2 19.0 *45 >50  Elbow    13.2  5.7         Left Ulnar Motor (Abd Dig Min)  32.2C  Wrist    3.8 <4.2 7.2 >3 B Elbow Wrist 3.6 19.0 53 >53  B Elbow    7.4  3.8  A Elbow B Elbow 1.7 11.0 65 >53  A Elbow    9.1  4.3          EMG   Side Muscle Nerve Root Ins Act Fibs Psw Amp Dur Poly Recrt Int Dennie Bible Comment  Left Abd Poll Brev Median C8-T1 Nml Nml Nml Nml Nml 0 Nml Nml   Left 1stDorInt Ulnar C8-T1 Nml Nml Nml Nml Nml 0 Nml Nml   Left PronatorTeres Median C6-7 Nml Nml Nml Nml Nml 0 Nml Nml   Left Biceps Musculocut C5-6 Nml Nml Nml Nml Nml 0 Nml Nml   Left Deltoid Axillary C5-6 Nml Nml Nml Nml Nml 0 Nml Nml     Nerve Conduction Studies Anti Sensory Left/Right Comparison   Stim Site L Lat (ms) R Lat (ms) L-R Lat (ms) L Amp (V) R Amp (V) L-R Amp (%) Site1 Site2 L Vel (m/s) R Vel (m/s) L-R Vel (m/s)  Median Acr Palm Anti Sensory (2nd Digit)  32.3C  Wrist *7.0   *3.6   Wrist Palm     Palm *6.2   10.8         Radial Anti Sensory (Base 1st Digit)  32.4C  Wrist 1.8   10.7   Wrist Base 1st Digit     Ulnar Anti Sensory (5th Digit)  32.6C  Wrist 3.6   *2.7   Wrist 5th Digit 39     Motor Left/Right Comparison   Stim Site L Lat (ms) R Lat (ms) L-R Lat (ms) L Amp (mV) R Amp (mV) L-R Amp (%) Site1 Site2 L Vel (m/s) R Vel (m/s) L-R Vel (m/s)  Median Motor (Abd Poll Brev)  32.3C  Wrist *9.0   5.8   Elbow Wrist *45    Elbow 13.2    5.7         Ulnar Motor (Abd Dig Min)  32.2C  Wrist 3.8   7.2   B Elbow Wrist 53    B Elbow 7.4   3.8   A  Elbow B Elbow 65    A Elbow 9.1   4.3            Waveforms:             Clinical History: No specialty comments available.     Objective:  VS:  HT:    WT:   BMI:     BP:   HR: bpm  TEMP: ( )  RESP:  Physical Exam Vitals and nursing note reviewed.  Constitutional:      General: She is not in acute distress.    Appearance: Normal appearance. She is well-developed. She is obese. She is not ill-appearing.  HENT:     Head: Normocephalic and atraumatic.  Eyes:     Conjunctiva/sclera: Conjunctivae normal.     Pupils: Pupils are equal, round, and reactive to light.  Cardiovascular:     Rate and Rhythm: Normal rate.     Pulses: Normal pulses.  Pulmonary:     Effort: Pulmonary effort is normal.  Musculoskeletal:        General: No swelling, tenderness or deformity.     Right lower leg: No edema.     Left lower leg: No edema.     Comments: Inspection reveals flattening of the left thenar eminence but no atrophy of the right APB or bilateral APB or FDI or hand intrinsics. There is no swelling, color changes, allodynia or dystrophic changes. There is 5 out of 5 strength in the bilateral wrist extension, finger abduction and long finger flexion. There is decreased sensation to light touch in the left median distribution. There is a negative Tinel's test at the bilateral wrist and elbow. There is a positive Phalen's test bilaterally. There is a negative Hoffmann's test bilaterally.  Skin:    General: Skin is warm and dry.     Findings: No erythema or rash.  Neurological:     General: No focal deficit present.     Mental Status: She is alert and oriented to person, place, and time.     Cranial Nerves: No cranial nerve deficit.     Sensory: Sensory deficit present.     Motor: Weakness present. No abnormal muscle tone.     Coordination: Coordination normal.     Gait: Gait  normal.  Psychiatric:        Mood and Affect: Mood normal.        Behavior: Behavior normal.      Imaging: No results found.

## 2023-03-04 ENCOUNTER — Ambulatory Visit (INDEPENDENT_AMBULATORY_CARE_PROVIDER_SITE_OTHER): Payer: PPO | Admitting: Orthopedic Surgery

## 2023-03-04 ENCOUNTER — Encounter: Payer: Self-pay | Admitting: Orthopedic Surgery

## 2023-03-04 DIAGNOSIS — G5602 Carpal tunnel syndrome, left upper limb: Secondary | ICD-10-CM | POA: Diagnosis not present

## 2023-03-04 NOTE — Patient Instructions (Signed)
We will place request for the operating room.  We will need to obtain medical clearance prior to surgery

## 2023-03-04 NOTE — Progress Notes (Signed)
Orthopaedic Clinic Return  Assessment: [name] is a 62 y.o. female with the following: Left carpal tunnel syndrome   Plan: [name] returns to clinic today after having EMGs completed on her left arm.  She has severe left carpal tunnel syndrome.  She continues to have a lot of shooting, burning pains, numbness and tingling.  She is interested in surgery.  The procedure was discussed in great detail.  All questions were answered.  She does have a history of diabetes, and her hemoglobin appears to be very well-controlled.  She also has a cardiac stent, and will need clearance from cardiology.  Request has been placed to proceed with surgery.  Will work to obtain medical clearance.  Once this is finalized, we can schedule surgery.  Risks and benefits of the procedure were discussed in great detail.  Risks including infection, bleeding, damage to surrounding structures including the median nerve, persistent symptoms, recurrence of symptoms and more severe complications were discussed.  All questions were answered.  Follow-up: Return for After surgery.   Subjective:  Chief Complaint  Patient presents with   Follow-up    Recheck on left CTS, review Nerve conduction study.     History of Present Illness: [name] is a 62 y.o. female who returns to clinic for repeat evaluation of left hand pain.  She has shooting pains, burning, numbness and tingling in the left hand.  Symptoms consistent with carpal tunnel syndrome.  She has obtained EMGs, and is here to discuss the results.  She wears a glove and a brace on the left wrist at all time.  She states this helps.  She is using topical treatments, taking Tylenol.  She continues to have severe symptoms.  Review of Systems: No fevers or chills + numbness or tingling No chest pain No shortness of breath No bowel or bladder dysfunction No GI distress No headaches   Objective: There were no vitals taken for this  visit.  Physical Exam:  Alert and oriented.  No acute distress.  Left hand without deformity.  No redness.  There is atrophy of the thenar eminence.  Decree sensation in the median nerve distribution.  Positive Tinel's.  Positive Phalen's.  Positive Durkan's.  IMAGING: I personally ordered and reviewed the following images:   EMG demonstrates severe compression of the median nerve at the carpal tunnel.  Oliver Barre, MD 03/04/2023 11:16 AM

## 2023-03-11 ENCOUNTER — Telehealth: Payer: Self-pay | Admitting: Orthopedic Surgery

## 2023-03-11 NOTE — Telephone Encounter (Signed)
Dr. Dallas Schimke pt - pt lvm wanting to know when she's to be scheduled for her hand surgery.  She's like a call back [phone]

## 2023-03-17 ENCOUNTER — Telehealth: Payer: Self-pay | Admitting: Orthopedic Surgery

## 2023-03-17 NOTE — Telephone Encounter (Signed)
Called pt and let her know the steps that have been taken to get her clearance. Says she has an appointment with her PCP next week, and her cardiologist was at [facility]. Pt stated it's been over a yr since she's seen a cardiologist, gave pt the # to [facility] and asked her to call for an appointment. Let pt know that after clearance is received we can get her scheduled in a timely manner, she verbalized understanding and will give cardiology a call for an appointment.

## 2023-03-17 NOTE — Telephone Encounter (Signed)
Have we heard anything back regarding clearance on this pt yet?

## 2023-03-17 NOTE — Telephone Encounter (Signed)
Still waiting on clearance. Refaxed request to GP office just now, and advised them it has been faxed twice now. Cardiologist listed in chart sent a letter to patient stating they are trying to reach her to schedule an appointment requested for clearance. They have not been able to reach her by phone to schedule an appointment. Letter states she has not been there in three years. Patient may have a different Cardiologist. I will be happy to reach out to patient and discuss.

## 2023-03-17 NOTE — Telephone Encounter (Signed)
Dr. Dallas Schimke patient - returned the patient's call, she stated that someone w/[facility] reached out to her today and scheduled her for an appointment for 03/21/23 to obtain medical clearance.

## 2023-03-18 ENCOUNTER — Telehealth: Payer: Self-pay | Admitting: Cardiology

## 2023-03-18 ENCOUNTER — Telehealth: Payer: Self-pay | Admitting: Orthopedic Surgery

## 2023-03-18 NOTE — Telephone Encounter (Signed)
[name] with [facility] called about [name] 04/03/1961 she guess she is having surgery with [place] and they need medical clearance and she does have an appt on Monday. You can call her back at  [phone] or fax number  [phone]- [phone]

## 2023-03-18 NOTE — Telephone Encounter (Signed)
Spoke to pt and she stated she has not been able to get in touch with her Ortho office.  She gave me the number of [phone].  I called the number and left vm to fax clearance info to [phone] and or call and we can get the clearance info from them.

## 2023-03-18 NOTE — Telephone Encounter (Signed)
Please fax clearance letter for upcoming appointment.

## 2023-03-18 NOTE — Telephone Encounter (Signed)
Clearance form refaxed for patient's scheduled appointment. Confirmation received.

## 2023-03-21 ENCOUNTER — Telehealth: Payer: Self-pay | Admitting: Cardiology

## 2023-03-21 ENCOUNTER — Telehealth: Payer: Self-pay | Admitting: Nurse Practitioner

## 2023-03-21 ENCOUNTER — Encounter: Payer: Self-pay | Admitting: Family Medicine

## 2023-03-21 ENCOUNTER — Encounter: Payer: Self-pay | Admitting: Nurse Practitioner

## 2023-03-21 ENCOUNTER — Encounter: Payer: Self-pay | Admitting: *Deleted

## 2023-03-21 ENCOUNTER — Ambulatory Visit (INDEPENDENT_AMBULATORY_CARE_PROVIDER_SITE_OTHER): Payer: PPO | Admitting: Family Medicine

## 2023-03-21 ENCOUNTER — Ambulatory Visit: Payer: PPO | Attending: Nurse Practitioner | Admitting: Nurse Practitioner

## 2023-03-21 VITALS — BP 114/62 | HR 71 | Temp 98.7°F | Ht 62.0 in | Wt 287.0 lb

## 2023-03-21 VITALS — BP 110/60 | HR 72 | Ht 62.0 in | Wt 288.0 lb

## 2023-03-21 DIAGNOSIS — S81852D Open bite, left lower leg, subsequent encounter: Secondary | ICD-10-CM | POA: Diagnosis not present

## 2023-03-21 DIAGNOSIS — E1169 Type 2 diabetes mellitus with other specified complication: Secondary | ICD-10-CM | POA: Diagnosis not present

## 2023-03-21 DIAGNOSIS — Z01818 Encounter for other preprocedural examination: Secondary | ICD-10-CM

## 2023-03-21 DIAGNOSIS — S81802A Unspecified open wound, left lower leg, initial encounter: Secondary | ICD-10-CM

## 2023-03-21 DIAGNOSIS — E785 Hyperlipidemia, unspecified: Secondary | ICD-10-CM

## 2023-03-21 DIAGNOSIS — K219 Gastro-esophageal reflux disease without esophagitis: Secondary | ICD-10-CM

## 2023-03-21 DIAGNOSIS — N1831 Chronic kidney disease, stage 3a: Secondary | ICD-10-CM | POA: Diagnosis not present

## 2023-03-21 DIAGNOSIS — E1159 Type 2 diabetes mellitus with other circulatory complications: Secondary | ICD-10-CM | POA: Diagnosis not present

## 2023-03-21 DIAGNOSIS — M5410 Radiculopathy, site unspecified: Secondary | ICD-10-CM

## 2023-03-21 DIAGNOSIS — E1122 Type 2 diabetes mellitus with diabetic chronic kidney disease: Secondary | ICD-10-CM

## 2023-03-21 DIAGNOSIS — E113393 Type 2 diabetes mellitus with moderate nonproliferative diabetic retinopathy without macular edema, bilateral: Secondary | ICD-10-CM | POA: Diagnosis not present

## 2023-03-21 DIAGNOSIS — G4733 Obstructive sleep apnea (adult) (pediatric): Secondary | ICD-10-CM

## 2023-03-21 DIAGNOSIS — F411 Generalized anxiety disorder: Secondary | ICD-10-CM

## 2023-03-21 DIAGNOSIS — I2510 Atherosclerotic heart disease of native coronary artery without angina pectoris: Secondary | ICD-10-CM | POA: Diagnosis not present

## 2023-03-21 DIAGNOSIS — Z01810 Encounter for preprocedural cardiovascular examination: Secondary | ICD-10-CM | POA: Diagnosis not present

## 2023-03-21 DIAGNOSIS — I152 Hypertension secondary to endocrine disorders: Secondary | ICD-10-CM

## 2023-03-21 DIAGNOSIS — I890 Lymphedema, not elsewhere classified: Secondary | ICD-10-CM | POA: Diagnosis not present

## 2023-03-21 DIAGNOSIS — R0609 Other forms of dyspnea: Secondary | ICD-10-CM | POA: Diagnosis not present

## 2023-03-21 DIAGNOSIS — E11618 Type 2 diabetes mellitus with other diabetic arthropathy: Secondary | ICD-10-CM

## 2023-03-21 DIAGNOSIS — E1142 Type 2 diabetes mellitus with diabetic polyneuropathy: Secondary | ICD-10-CM

## 2023-03-21 DIAGNOSIS — W540XXD Bitten by dog, subsequent encounter: Secondary | ICD-10-CM

## 2023-03-21 DIAGNOSIS — N183 Chronic kidney disease, stage 3 unspecified: Secondary | ICD-10-CM | POA: Diagnosis not present

## 2023-03-21 DIAGNOSIS — G5602 Carpal tunnel syndrome, left upper limb: Secondary | ICD-10-CM

## 2023-03-21 DIAGNOSIS — F329 Major depressive disorder, single episode, unspecified: Secondary | ICD-10-CM

## 2023-03-21 DIAGNOSIS — I10 Essential (primary) hypertension: Secondary | ICD-10-CM

## 2023-03-21 DIAGNOSIS — Z5941 Food insecurity: Secondary | ICD-10-CM

## 2023-03-21 DIAGNOSIS — Z599 Problem related to housing and economic circumstances, unspecified: Secondary | ICD-10-CM

## 2023-03-21 DIAGNOSIS — E0843 Diabetes mellitus due to underlying condition with diabetic autonomic (poly)neuropathy: Secondary | ICD-10-CM

## 2023-03-21 LAB — BAYER DCA HB A1C WAIVED: HB A1C (BAYER DCA - WAIVED): 6.9 % — ABNORMAL HIGH (ref 4.8–5.6)

## 2023-03-21 LAB — COAGUCHEK XS/INR WAIVED
INR: 1 (ref 0.9–1.1)
Prothrombin Time: 12.6 s

## 2023-03-21 MED ORDER — GABAPENTIN 600 MG PO TABS: ORAL_TABLET | ORAL | 3 refills | Status: DC

## 2023-03-21 MED ORDER — TELMISARTAN 40 MG PO TABS: 40.0000 mg | ORAL_TABLET | Freq: Every day | ORAL | 3 refills | Status: DC

## 2023-03-21 MED ORDER — METHOCARBAMOL 500 MG PO TABS: 500.0000 mg | ORAL_TABLET | Freq: Four times a day (QID) | ORAL | 1 refills | Status: DC | PRN

## 2023-03-21 MED ORDER — SERTRALINE HCL 50 MG PO TABS: 50.0000 mg | ORAL_TABLET | Freq: Every day | ORAL | 3 refills | Status: DC

## 2023-03-21 MED ORDER — FLUCONAZOLE 150 MG PO TABS
150.0000 mg | ORAL_TABLET | Freq: Once | ORAL | 0 refills | Status: AC
Start: 2023-03-21 — End: 2023-03-21

## 2023-03-21 MED ORDER — OMEPRAZOLE 40 MG PO CPDR: 40.0000 mg | DELAYED_RELEASE_CAPSULE | Freq: Every day | ORAL | 3 refills | Status: DC

## 2023-03-21 MED ORDER — NITROGLYCERIN 0.4 MG SL SUBL: 0.4000 mg | SUBLINGUAL_TABLET | SUBLINGUAL | 3 refills | Status: DC | PRN

## 2023-03-21 MED ORDER — FREESTYLE LIBRE 3 SENSOR MISC: 3 refills | Status: DC

## 2023-03-21 MED ORDER — METOPROLOL TARTRATE 25 MG PO TABS: 25.0000 mg | ORAL_TABLET | Freq: Two times a day (BID) | ORAL | 3 refills | Status: DC

## 2023-03-21 MED ORDER — ROSUVASTATIN CALCIUM 20 MG PO TABS: 20.0000 mg | ORAL_TABLET | Freq: Every day | ORAL | 3 refills | Status: DC

## 2023-03-21 MED ORDER — AMOXICILLIN-POT CLAVULANATE 875-125 MG PO TABS
1.0000 | ORAL_TABLET | Freq: Two times a day (BID) | ORAL | 0 refills | Status: DC
Start: 2023-03-21 — End: 2023-04-21

## 2023-03-21 MED ORDER — FUROSEMIDE 40 MG PO TABS
ORAL_TABLET | ORAL | 3 refills | Status: DC
Start: 2023-03-21 — End: 2024-02-22

## 2023-03-21 NOTE — Patient Instructions (Addendum)
Medication Instructions:  Your physician recommends that you continue on your current medications as directed. Please refer to the Current Medication list given to you today.  Labwork: none  Testing/Procedures: Your physician has requested that you have an echocardiogram. Echocardiography is a painless test that uses sound waves to create images of your heart. It provides your doctor with information about the size and shape of your heart and how well your heart's chambers and valves are working. This procedure takes approximately one hour. There are no restrictions for this procedure. Please do NOT wear cologne, perfume, aftershave, or lotions (deodorant is allowed). Please arrive 15 minutes prior to your appointment time. Your physician has requested that you have a lexiscan myoview. For further information please visit [email or web address] Please follow instruction sheet, as given.  Follow-Up: Your physician recommends that you schedule a follow-up appointment in: 3-4 weeks  Any Other Special Instructions Will Be Listed Below (If Applicable). You have been referred to Lymphedema Clinic You have been referred to Wound Clinic  If you need a refill on your cardiac medications before your next appointment, please call your pharmacy.

## 2023-03-21 NOTE — Telephone Encounter (Signed)
   Name: [name]  DOB: 03-05-1961  MRN: [id number]  Primary Cardiologist: Nona Dell, MD  Chart reviewed as part of pre-operative protocol coverage. Because of [name]'s past medical history and time since last visit, she will require a follow-up in-office visit in order to better assess preoperative cardiovascular risk.  Pre-op covering staff: - Please schedule appointment and call patient to inform them. If patient already had an upcoming appointment within acceptable timeframe, please add "pre-op clearance" to the appointment notes so provider is aware. - Please contact requesting surgeon's office via preferred method (i.e, phone, fax) to inform them of need for appointment prior to surgery.  Aspirin hold will be made at the time of the appointment.  Patient has not been seen since 2021.  Sharlene Dory, PA-C  03/21/2023, 7:58 AM

## 2023-03-21 NOTE — Telephone Encounter (Signed)
   Pre-operative Risk Assessment    Patient Name: [name]  DOB: 03-07-1961 MRN: [id number]      Request for Surgical Clearance    Procedure:   Left Carpal Tunnel Release  Date of Surgery:  Clearance TBD                                 Surgeon:  Loraine Leriche A. Dallas Schimke, MD Surgeon's Group or Practice Name:  [facility] Phone number:  [phone] Fax number:  [phone] Attn: April B.   Type of Clearance Requested:   - Medical  - Pharmacy:  Hold Aspirin Can patient hold 7 days prior to surgery.   Type of Anesthesia:   Choice   Additional requests/questions:    Signed, Seymour Bars   03/21/2023, 7:20 AM

## 2023-03-21 NOTE — Telephone Encounter (Signed)
Checking percert on the following patient for testing scheduled at [facility].    LEXISCAN    03/25/2023

## 2023-03-21 NOTE — Progress Notes (Incomplete)
Office Visit    Patient Name: [name] Date of Encounter: 03/21/2023  PCP:  [name] DO   [facility]  Cardiologist:  Nona Dell, MD *** Advanced Practice Provider:  No care team member to display Electrophysiologist:  None  {Press F2 to show EP APP, CHF, sleep or structural heart MD               :[id number]}  { Click here to update then REFRESH NOTE - MD (PCP) or APP (Team Member)  Change PCP Type for MD, Specialty for APP is either Cardiology or Clinical Cardiac Electrophysiology  :[id number]}  Chief Complaint    [name] is a 62 y.o. female with a hx of CAD, hyperlipidemia, hypertension, OSA on CPAP, type 2 diabetes, CKD stage III, GERD, and morbid obesity, who presents today for preoperative cardiovascular risk assessment.  Past Medical History    Past Medical History:  Diagnosis Date   Arthritis    CAD (coronary artery disease)    BMS to circumflex 2007 - Dr. Juanda Chance   CKD (chronic kidney disease) stage 3, GFR 30-59 ml/min (HCC)    Essential hypertension    Falls frequently    GERD (gastroesophageal reflux disease)    HA (headache)    Hyperlipidemia    Hyperlipidemia associated with type 2 diabetes mellitus (HCC) 02/03/2011   Left-sided face pain    Morbid obesity (HCC)    Noncompliance    OSA (obstructive sleep apnea)    Uses CPAP   Type 2 diabetes mellitus (HCC)    Urinary incontinence    Past Surgical History:  Procedure Laterality Date   ABDOMINAL HERNIA REPAIR     ABDOMINAL HYSTERECTOMY     BACK SURGERY     BREAST SURGERY     biopsy per right breast; pt states has silver piece in for marking    Cataract surgery Bilateral    CHOLECYSTECTOMY     COLONOSCOPY N/A 04/10/2013   Procedure: COLONOSCOPY;  Surgeon: Stacey Hippo, MD;  Location: [facility];  Service: Endoscopy;  Laterality: N/A;   CORONARY ANGIOPLASTY WITH STENT PLACEMENT  2012   LEFT HEART CATH AND CORONARY ANGIOGRAPHY N/A 06/19/2020    Procedure: LEFT HEART CATH AND CORONARY ANGIOGRAPHY;  Surgeon: Lyn Records, MD;  Location: [facility] INVASIVE CV LAB;  Service: Cardiovascular;  Laterality: N/A;   LUMBAR LAMINECTOMY/DECOMPRESSION MICRODISCECTOMY N/A 04/25/2015   Procedure: CENTRAL LUMBAR /DECOMPRESSION L4-L5;  Surgeon: Ranee Gosselin, MD;  Location: [facility] ORS;  Service: Orthopedics;  Laterality: N/A;   TUBAL LIGATION      Allergies  Allergies  Allergen Reactions   Duloxetine    Farxiga [Dapagliflozin]     RECURRENT YEAST INFECTIONS   Morphine And Related Nausea And Vomiting    History of Present Illness    [name] is a 61 y.o. female with a PMH as mentioned above.  Last seen by Dr. Diona Browner in 2019.  At that time, she noted increasing chest discomfort episodes.  Cannot identify specific activity that brought on her symptoms.  Myoview was arranged and was considered low risk.  She noted chest pain in August 2021 of sudden onset at rest.  Occurred in left forearm radiated to left arm/shoulder and chest with associated shortness of breath and diaphoresis.  Mild improvement after taking nitroglycerin.  Admitted and underwent cardiac catheterization that revealed widely patent OM stent, occluded dominant RCA with diffuse luminal irregularities, no high-grade lesions identified.  Last seen  by Stacey Harding, NP on August 05, 2020 for post cardiac catheterization follow-up.  She still noted minor chest pain, attributed this to stress and anxiety, at the time her father was in hospice and was slowly dying.  Denied any recent anginal or exertional symptoms.  Stated her weight was recently increased.  At that time, was having issues with blood sugar control.   Can't stand long because of back. Has been a chronic issue.   EKGs/Labs/Other Studies Reviewed:   The following studies were reviewed today:   EKG:  EKG is ordered today.  The ekg ordered today demonstrates NSR, 71 bpm, RBBB (chronic), no acute ischemic changes.    Recent Labs: 06/09/2022: ALT 9; BUN 11; Creatinine, Ser 1.08; Potassium 3.7; Sodium 139 08/09/2022: Hemoglobin 14.0; Platelets CANCELED  Recent Lipid Panel    Component Value Date/Time   CHOL 151 08/09/2022 0956   TRIG 224 (H) 08/09/2022 0956   TRIG 211 (H) 12/30/2014 1537   HDL 38 (L) 08/09/2022 0956   HDL 48 12/30/2014 1537   CHOLHDL 4.0 08/09/2022 0956   CHOLHDL 4.5 04/09/2013 1135   VLDL 37 04/09/2013 1135   LDLCALC 76 08/09/2022 0956   LDLCALC 42 08/28/2014 1037   LDLDIRECT 52 01/12/2022 1452    Risk Assessment/Calculations:  {Does this patient have ATRIAL FIBRILLATION?:[phone]}  Home Medications   No outpatient medications have been marked as taking for the 03/21/23 encounter (Appointment) with Stacey Dory, NP.     Review of Systems   ***   All other systems reviewed and are otherwise negative except as noted above.  Physical Exam    VS:  There were no vitals taken for this visit. , BMI There is no height or weight on file to calculate BMI.  Wt Readings from Last 3 Encounters:  03/21/23 287 lb (130.2 kg)  12/29/22 280 lb (127 kg)  11/26/22 283 lb (128.4 kg)     GEN: Well nourished, well developed, in no acute distress. HEENT: normal. Neck: Supple, no JVD, carotid bruits, or masses. Cardiac: ***RRR, no murmurs, rubs, or gallops. No clubbing, cyanosis, edema.  ***Radials/PT 2+ and equal bilaterally.  Respiratory:  ***Respirations regular and unlabored, clear to auscultation bilaterally. GI: Soft, nontender, nondistended. MS: No deformity or atrophy. Skin: Warm and dry, no rash. Neuro:  Strength and sensation are intact. Psych: Normal affect.  Assessment & Plan    ***  Shared Decision Making/Informed Consent{ All outpatient stress tests require an informed consent ([id number]) ATTESTATION ORDER       :[id number]} The risks [chest pain, shortness of breath, cardiac arrhythmias, dizziness, blood pressure fluctuations, myocardial infarction,  stroke/transient ischemic attack, nausea, vomiting, allergic reaction, radiation exposure, metallic taste sensation and life-threatening complications (estimated to be 1 in 10,000)], benefits (risk stratification, diagnosing coronary artery disease, treatment guidance) and alternatives of a nuclear stress test were discussed in detail with Ms. Tedder and she agrees to proceed.   {Click Here to Calculate RCRI      :[id number]}  { Click Here to Calculate DASI      :130865784} Ms. Duckett's perioperative risk of a major cardiac event is 0.9% according to the Revised Cardiac Risk Index (RCRI).  Therefore, she is at low risk for perioperative complications.   Her functional capacity is poor at 3.3 METs according to the Duke Activity Status Index (DASI). Recommendations: The patient requires an echocardiogram before a disposition can be made regarding surgical risk.  Antiplatelet and/or Anticoagulation Recommendations: {Antiplatelet Recommendations                  :21036016} {Anticoagulation Recommendations           :57846962}     Disposition: Follow up {follow up:15908} with Nona Dell, MD or APP.  Signed, Stacey Dory, NP 03/21/2023, 12:59 PM [facility]

## 2023-03-21 NOTE — Progress Notes (Signed)
Subjective: ZO:[id number] clearance PCP: [name], DO UJW:JXBJY RASHIMA HOLSTINE is a 62 y.o. female presenting to clinic today for:  1. Pt is a 62 y.o. female who is here for preoperative clearance for left carpal tunnel surgery with Dr Dallas Schimke.  She has scheduled cardiac clearance this afternoon.  She denies any chest pain.  She has chronic lower extremity edema.  She has never had any issues with postop difficulty arousing, excessive nausea and vomiting or poor healing  1) High Risk Cardiac Conditions-   1) Recent MI - No.  2) Decompensated Heart Failure - No.  3) Unstable angina - No.  4) Symptomatic arrythmia - No.  5) Sx Valvular Disease - No.  2) Intermediate Risk Factors - DM, CAD - Yes.    2) Functional Status - > 4 mets (Walk, run, climb stairs) Yes.   But it causes her a lot of pain  3) Surgery Specific Risk - Intermediate (orthopedic)  4) Further Noninvasive evaluation - EKG per cardiology appt today  5) Need for medical therapy - Beta Blocker, Statins indicated ? No.  2. Type 2 Diabetes with hypertension, hyperlipidemia:  She needs freestyle libre renewed.  She notes that she has been having some issues with it falling off so her grandson has been putting an extra clear bandage over it to keep it attached.  She does not report any hypoglycemic episodes but does need her Rybelsus.  She is not sure if the shipment arrived here yet or not.  Last eye exam: Completed today with Dr Conley Rolls Last foot exam: Needs Last A1c:  Lab Results  Component Value Date   HGBA1C 6.7 (H) 08/09/2022   Nephropathy screen indicated?:  Needs Last flu, zoster and/or pneumovax:  Immunization History  Administered Date(s) Administered   Influenza,inj,Quad PF,6+ Mos 08/10/2013, 08/28/2014, 09/25/2015, 11/23/2017, 11/27/2018   Pneumococcal Conjugate-13 11/27/2018    3.  Dog bite Patient reports that she sustained a dog bite to the left lower leg about 2 and half weeks ago.  She was seen at  urgent care and prescribed an antibiotic.  She does not have any fevers or drainage but she still has some redness and soreness along the area.    PE: Vitals:   03/21/23 1131  BP: 114/62  Pulse: 71  Temp: 98.7 F (37.1 C)  SpO2: 97%   Physical Examination: General appearance - alert, well appearing, and in no distress and morbidly obese Mental status - alert, oriented to person, place, and time Mouth - mucous membranes moist, pharynx normal without lesions and Mallampati 1 Neck - supple, no significant adenopathy Chest - clear to auscultation, no wheezes, rales or rhonchi, symmetric air entry Heart - normal rate, regular rhythm, normal S1, S2, no murmurs, rubs, clicks or gallops Musculoskeletal -antalgic gait.  Ambulating very slowly.  Left wrist in brace Extremities -bilateral lower extremity lymphedema present. Skin -she has a healing lesion along the left anterior lower leg with surrounding erythema, mild warmth.  No exudate or bleeding.  It is slightly tender to palpation  Diabetic Foot Exam - Simple   Simple Foot Form Diabetic Foot exam was performed with the following findings: Yes 03/21/2023 12:30 PM  Visual Inspection No deformities, no ulcerations, no other skin breakdown bilaterally: Yes Sensation Testing See comments: Yes Pulse Check See comments: Yes Comments +1 pedal pulses.  Absent monofilament sensation all toes except for digits 3 4 and 5 on the right where it is very light sensation  No problem-specific Assessment & Plan notes found for this encounter.   Preop examination - Plan: CBC, CoaguChek XS/INR Waived  Severe carpal tunnel syndrome of left wrist - Plan: AMB Referral to Chronic Care Management Services  Type 2 diabetes mellitus with stage 3a chronic kidney disease, without long-term current use of insulin (HCC) - Plan: CMP14+EGFR, CBC, Bayer DCA Hb A1c Waived, Microalbumin / creatinine urine ratio, VITAMIN D 25 Hydroxy (Vit-D Deficiency,  Fractures), AMB Referral to Chronic Care Management Services  Hyperlipidemia associated with type 2 diabetes mellitus (HCC) - Plan: CMP14+EGFR, rosuvastatin (CRESTOR) 20 MG tablet, AMB Referral to Chronic Care Management Services  Hypertension associated with diabetes (HCC) - Plan: CMP14+EGFR, metoprolol tartrate (LOPRESSOR) 25 MG tablet, furosemide (LASIX) 40 MG tablet, AMB Referral to Chronic Care Management Services  Moderate nonproliferative diabetic retinopathy of both eyes without macular edema associated with type 2 diabetes mellitus (HCC) - Plan: AMB Referral to Chronic Care Management Services  Diabetic autonomic neuropathy associated with diabetes mellitus due to underlying condition (HCC) - Plan: AMB Referral to Chronic Care Management Services  Arthritis associated with diabetes (HCC) - Plan: AMB Referral to Chronic Care Management Services  Morbid (severe) obesity due to excess calories (HCC) - Plan: CMP14+EGFR, AMB Referral to Chronic Care Management Services  OSA (obstructive sleep apnea) - Plan: CBC, AMB Referral to Chronic Care Management Services  Type 2 diabetes mellitus with diabetic polyneuropathy, without long-term current use of insulin (HCC) - Plan: gabapentin (NEURONTIN) 600 MG tablet, Continuous Glucose Sensor (FREESTYLE LIBRE 3 SENSOR) MISC, AMB Referral to Chronic Care Management Services  Back pain with radiculopathy - Plan: methocarbamol (ROBAXIN) 500 MG tablet, AMB Referral to Chronic Care Management Services  Gastroesophageal reflux disease without esophagitis - Plan: omeprazole (PRILOSEC) 40 MG capsule, AMB Referral to Chronic Care Management Services  Generalized anxiety disorder - Plan: sertraline (ZOLOFT) 50 MG tablet, AMB Referral to Chronic Care Management Services  Reactive depression - Plan: sertraline (ZOLOFT) 50 MG tablet, AMB Referral to Chronic Care Management Services  Dog bite of left lower leg, subsequent encounter - Plan:  amoxicillin-clavulanate (AUGMENTIN) 875-125 MG tablet, fluconazole (DIFLUCAN) 150 MG tablet  Food insecurity - Plan: AMB Referral to Chronic Care Management Services  Financial difficulties - Plan: AMB Referral to Chronic Care Management Services  I have independently evaluated patient.  NASREEN GARRAND is a 62 y.o. female who is intermediate risk for an intermediate risk surgery.  There are modifiable risk factors (obesity. Iyari Cavicchi Freyre's RCRI calculation for MACE is: 1.    Sugar well-controlled with A1c of 6.9.  Blood pressure well-controlled.  Okay to hold Rybelsus 1 week prior to surgery.  Discussed risks of aspiration with that medication if not held.  I have renewed her Robaxin to treat low back pain.  I also placed a referral to CCM for multiple medical issues, financial hardships and food insecurity.  For the dog bite in the left lower leg I feel like it likely needs extended antibiotics Augmentin sent.  Diflucan sent if needed for yeast.    Artist Bloom M. Nadine Counts, DO [facility]

## 2023-03-21 NOTE — Telephone Encounter (Signed)
Patient has already been scheduled to see Sharlene Dory, NP on 5/13 at 3 pm. I will fax clearance to requesting provider's office.

## 2023-03-22 ENCOUNTER — Other Ambulatory Visit: Payer: Self-pay | Admitting: Pharmacist

## 2023-03-22 ENCOUNTER — Other Ambulatory Visit: Payer: Self-pay | Admitting: Family Medicine

## 2023-03-22 DIAGNOSIS — E559 Vitamin D deficiency, unspecified: Secondary | ICD-10-CM

## 2023-03-22 DIAGNOSIS — E1142 Type 2 diabetes mellitus with diabetic polyneuropathy: Secondary | ICD-10-CM

## 2023-03-22 LAB — CBC
Hematocrit: 41 % (ref 34.0–46.6)
Hemoglobin: 13.4 g/dL (ref 11.1–15.9)
MCH: 31.1 pg (ref 26.6–33.0)
MCHC: 32.7 g/dL (ref 31.5–35.7)
MCV: 95 fL (ref 79–97)
Platelets: 102 x10E3/uL — ABNORMAL LOW (ref 150–450)
RBC: 4.31 x10E6/uL (ref 3.77–5.28)
RDW: 14.6 % (ref 11.7–15.4)
WBC: 8 x10E3/uL (ref 3.4–10.8)

## 2023-03-22 LAB — MICROALBUMIN / CREATININE URINE RATIO
Creatinine, Urine: 64.6 mg/dL
Microalb/Creat Ratio: 8 mg/g{creat} (ref 0–29)
Microalbumin, Urine: 5.2 ug/mL

## 2023-03-22 LAB — CMP14+EGFR
ALT: 19 IU/L (ref 0–32)
AST: 20 IU/L (ref 0–40)
Albumin/Globulin Ratio: 1.9 (ref 1.2–2.2)
Albumin: 4.2 g/dL (ref 3.9–4.9)
Alkaline Phosphatase: 151 IU/L — ABNORMAL HIGH (ref 44–121)
BUN/Creatinine Ratio: 12 (ref 12–28)
BUN: 16 mg/dL (ref 8–27)
Bilirubin Total: 0.2 mg/dL (ref 0.0–1.2)
CO2: 20 mmol/L (ref 20–29)
Calcium: 8.9 mg/dL (ref 8.7–10.3)
Chloride: 106 mmol/L (ref 96–106)
Creatinine, Ser: 1.32 mg/dL — ABNORMAL HIGH (ref 0.57–1.00)
Globulin, Total: 2.2 g/dL (ref 1.5–4.5)
Glucose: 101 mg/dL — ABNORMAL HIGH (ref 70–99)
Potassium: 4.2 mmol/L (ref 3.5–5.2)
Sodium: 145 mmol/L — ABNORMAL HIGH (ref 134–144)
Total Protein: 6.4 g/dL (ref 6.0–8.5)
eGFR: 45 mL/min/1.73 — ABNORMAL LOW

## 2023-03-22 LAB — VITAMIN D 25 HYDROXY (VIT D DEFICIENCY, FRACTURES): Vit D, 25-Hydroxy: 5.1 ng/mL — ABNORMAL LOW (ref 30.0–100.0)

## 2023-03-22 MED ORDER — VITAMIN D (ERGOCALCIFEROL) 1.25 MG (50000 UNIT) PO CAPS
50000.0000 [IU] | ORAL_CAPSULE | ORAL | 0 refills | Status: DC
Start: 2023-03-22 — End: 2023-04-28

## 2023-03-22 MED ORDER — FREESTYLE LIBRE 3 SENSOR MISC
5 refills | Status: DC
Start: 2023-03-22 — End: 2023-04-07

## 2023-03-22 NOTE — Progress Notes (Signed)
    03/22/2023 Name: [name] MRN: [id number] DOB: 25-Apr-1961   Discussed meds and labs with patient.  She is doing well on Rybelsus 14mg  daily & insulin.  No reported side effects.  Patient on libre 2 CGM, but is interested in [place].  Will set up at pharmacy.  Continue current regimen.  Quality addressed.  Denies personal and family history of Medullary thyroid cancer (MTC)   Lab Results  Component Value Date   HGBA1C 6.9 (H) 03/21/2023    Kieth Brightly, PharmD, BCACP Clinical Pharmacist, [facility]

## 2023-03-23 ENCOUNTER — Other Ambulatory Visit: Payer: PPO | Admitting: Pharmacist

## 2023-03-23 DIAGNOSIS — E1142 Type 2 diabetes mellitus with diabetic polyneuropathy: Secondary | ICD-10-CM

## 2023-03-24 ENCOUNTER — Ambulatory Visit: Payer: PPO | Attending: Nurse Practitioner

## 2023-03-24 ENCOUNTER — Telehealth: Payer: Self-pay

## 2023-03-24 DIAGNOSIS — R0609 Other forms of dyspnea: Secondary | ICD-10-CM | POA: Diagnosis not present

## 2023-03-24 LAB — ECHOCARDIOGRAM COMPLETE
AR max vel: 2.06 cm2
AV Area VTI: 2.4 cm2
AV Area mean vel: 2.25 cm2
AV Mean grad: 5 mmHg
AV Peak grad: 9.4 mmHg
AV Vena cont: 0.3 cm
Ao pk vel: 1.54 m/s
Area-P 1/2: 2.5 cm2
Calc EF: 60.3 %
MV M vel: 3.5 m/s
MV Peak grad: 49 mmHg
P 1/2 time: 1134 msec
S' Lateral: 3.2 cm
Single Plane A2C EF: 61.1 %
Single Plane A4C EF: 60.6 %

## 2023-03-24 NOTE — Progress Notes (Signed)
  Chronic Care Management   Note  03/24/2023 Name: [name] MRN: [id number] DOB: 1960/12/14  [name] is a 62 y.o. year old female who is a primary care patient of [name], DO. I reached out to [name] by phone today in response to a referral sent by [name] PCP.  [name] was given information about Chronic Care Management services today including:  CCM service includes personalized support from designated clinical staff supervised by the physician, including individualized plan of care and coordination with other care providers 24/7 contact phone numbers for assistance for urgent and routine care needs. Service will only be billed when office clinical staff spend 20 minutes or more in a month to coordinate care. Only one practitioner may furnish and bill the service in a calendar month. The patient may stop CCM services at amy time (effective at the end of the month) by phone call to the office staff. The patient will be responsible for cost sharing (co-pay) or up to 20% of the service fee (after annual deductible is met)  [name]  agreedto scheduling an appointment with the CCM RN Case Manager   Follow up plan: Patient agreed to scheduled appointment with RN Case Manager on 03/29/2023 Pharm d 05/06/2023(date/time).   [name], RMA Care Guide [facility]  [place], [place] [address] Direct Dial: [phone] [name]

## 2023-03-25 ENCOUNTER — Ambulatory Visit (HOSPITAL_COMMUNITY)
Admission: RE | Admit: 2023-03-25 | Discharge: 2023-03-25 | Disposition: A | Discharge status: Home / Self Care | Payer: PPO | Source: Ambulatory Visit | Attending: Nurse Practitioner | Admitting: Nurse Practitioner

## 2023-03-25 ENCOUNTER — Encounter (HOSPITAL_BASED_OUTPATIENT_CLINIC_OR_DEPARTMENT_OTHER)
Admission: RE | Admit: 2023-03-25 | Discharge: 2023-03-25 | Disposition: A | Discharge status: Home / Self Care | Payer: PPO | Source: Ambulatory Visit | Attending: Nurse Practitioner | Admitting: Nurse Practitioner

## 2023-03-25 DIAGNOSIS — R0609 Other forms of dyspnea: Secondary | ICD-10-CM

## 2023-03-25 LAB — NM MYOCAR MULTI W/SPECT W/WALL MOTION / EF
LV dias vol: 78 mL (ref 46–106)
LV sys vol: 20 mL
Nuc Stress EF: 75 %
Peak HR: 83 {beats}/min
RATE: 0.2
Rest HR: 64 {beats}/min
Rest Nuclear Isotope Dose: 10.4 mCi
SDS: 4
SRS: 3
SSS: 7
ST Depression (mm): 0 mm
Stress Nuclear Isotope Dose: 30 mCi
TID: 1.16

## 2023-03-25 MED ORDER — TECHNETIUM TC 99M TETROFOSMIN IV KIT
30.0000 | PACK | Freq: Once | INTRAVENOUS | Status: AC | PRN
  Administered 2023-03-25: 30 via INTRAVENOUS

## 2023-03-25 MED ORDER — TECHNETIUM TC 99M TETROFOSMIN IV KIT
10.4000 | PACK | Freq: Once | INTRAVENOUS | Status: AC | PRN
  Administered 2023-03-25: 10.4 via INTRAVENOUS

## 2023-03-25 MED ORDER — REGADENOSON 0.4 MG/5ML IV SOLN
INTRAVENOUS | Status: AC
  Administered 2023-03-25: 0.4 mg via INTRAVENOUS
  Filled 2023-03-25: qty 5

## 2023-03-25 MED ORDER — SODIUM CHLORIDE FLUSH 0.9 % IV SOLN
INTRAVENOUS | Status: AC
  Administered 2023-03-25: 10 mL via INTRAVENOUS
  Filled 2023-03-25: qty 10

## 2023-03-29 ENCOUNTER — Ambulatory Visit (INDEPENDENT_AMBULATORY_CARE_PROVIDER_SITE_OTHER): Payer: PPO | Admitting: *Deleted

## 2023-03-29 ENCOUNTER — Other Ambulatory Visit: Payer: Self-pay | Admitting: Family Medicine

## 2023-03-29 DIAGNOSIS — J42 Unspecified chronic bronchitis: Secondary | ICD-10-CM

## 2023-03-29 DIAGNOSIS — I152 Hypertension secondary to endocrine disorders: Secondary | ICD-10-CM

## 2023-03-29 DIAGNOSIS — E1142 Type 2 diabetes mellitus with diabetic polyneuropathy: Secondary | ICD-10-CM

## 2023-03-29 MED ORDER — ALBUTEROL SULFATE HFA 108 (90 BASE) MCG/ACT IN AERS
2.0000 | INHALATION_SPRAY | RESPIRATORY_TRACT | 0 refills | Status: DC | PRN
Start: 2023-03-29 — End: 2023-04-28

## 2023-03-29 NOTE — Plan of Care (Signed)
Chronic Care Management Provider Comprehensive Care Plan    03/29/2023 Name: [name] MRN: [id number] DOB: 28-Oct-1961  Referral to Chronic Care Management (CCM) services was placed by Provider:  Delynn Flavin DO on Date: 03/21/23.  Chronic Condition 1: HYPERTENSION Provider Assessment and Plan  Type 2 Diabetes with hypertension, hyperlipidemia:  She needs freestyle libre renewed.  She notes that she has been having some issues with it falling off so her grandson has been putting an extra clear bandage over it to keep it attached.  She does not report any hypoglycemic episodes but does need her Rybelsus.  She is not sure if the shipment arrived here yet or not.   Last eye exam: Completed today with Dr Conley Rolls Last foot exam: Needs Last A1c:   Expected Outcome/Goals Addressed This Visit (Provider CCM goals/Provider Assessment and plan  CCM (HYPERTENSION) EXPECTED OUTCOME: SELF-MANAGE AND REDUCE SYMPTOMS OF HYPERTENSION  Symptom Management Condition 1: Take medications as prescribed   Attend all scheduled provider appointments Call pharmacy for medication refills 3-7 days in advance of running out of medications Attend church or other social activities Call provider office for new concerns or questions  Work with the social worker to address care coordination needs and will continue to work with the clinical team to address health care and disease management related needs check blood pressure 3 times per week choose a place to take my blood pressure (home, clinic or office, retail store) write blood pressure results in a log or diary learn about high blood pressure keep a blood pressure log keep all doctor appointments take medications for blood pressure exactly as prescribed begin an exercise program report new symptoms to your doctor eat more whole grains, fruits and vegetables, lean meats and healthy fats Education mailed- low sodium diet Care guide will be contacting you for  food/ utilities resources Check [employer] to see if inhaler is ready for pickup  Chronic Condition 2: DIABETES Provider Assessment and Plan  Type 2 Diabetes with hypertension, hyperlipidemia:  She needs freestyle libre renewed.  She notes that she has been having some issues with it falling off so her grandson has been putting an extra clear bandage over it to keep it attached.  She does not report any hypoglycemic episodes but does need her Rybelsus.  She is not sure if the shipment arrived here yet or not.   Last eye exam: Completed today with Dr Conley Rolls Last foot exam: Needs Last A1c:   Expected Outcome/Goals Addressed This Visit (Provider CCM goals/Provider Assessment and plan  CCM (DIABETES) EXPECTED OUTCOME: MONITOR, SELF-MANAGE AND REDUCE SYMPTOMS OF DIABETES  Symptom Management Condition 2: Take medications as prescribed   Attend all scheduled provider appointments Call pharmacy for medication refills 3-7 days in advance of running out of medications Attend church or other social activities Call provider office for new concerns or questions  Work with the social worker to address care coordination needs and will continue to work with the clinical team to address health care and disease management related needs check blood sugar at prescribed times: [name]  check feet daily for cuts, sores or redness enter blood sugar readings and medication or insulin into daily log take the blood sugar log to all doctor visits take the blood sugar meter to all doctor visits trim toenails straight across limit fast food meals to no more than 1 per week manage portion size prepare main meal at home 3 to 5 days each week read food labels  for fat, fiber, carbohydrates and portion size set a realistic goal keep feet up while sitting wash and dry feet carefully every day wear comfortable, cotton socks Call your doctor for signs of skin infection/ non healing- redness, foul smelling drainage,  warmth, fever, if overall you are not feeling well Education mailed- hypoglycemia  Problem List Patient Active Problem List   Diagnosis Date Noted   Bilateral renal cysts 08/06/2022   Unstable angina (HCC) 06/18/2020   Low back pain 04/11/2019   Moderate nonproliferative diabetic retinopathy of both eyes without macular edema associated with type 2 diabetes mellitus (HCC) 01/05/2019   Hypertension associated with diabetes (HCC) 09/11/2015   Spinal stenosis, lumbar region, with neurogenic claudication 04/25/2015   Arthritis associated with diabetes (HCC) 08/28/2014   Peripheral edema 08/28/2014   Diabetic neuropathy (HCC) 02/18/2014   External bleeding hemorrhoids 04/10/2013   Morbid (severe) obesity due to excess calories (HCC) 04/09/2013   Diabetes (HCC) 04/09/2013   Thrombocytopenia, unspecified (HCC) 04/09/2013   OSA (obstructive sleep apnea) 07/06/2011   DOE (dyspnea on exertion) 06/06/2011   Coronary artery disease 02/03/2011   GERD (gastroesophageal reflux disease) 02/03/2011   Noncompliance 02/03/2011   Generalized anxiety disorder 02/03/2011   Insomnia 02/03/2011   Dyslipidemia due to type 2 diabetes mellitus (HCC) 02/03/2011   Tobacco user 02/03/2011    Medication Management  Current Outpatient Medications:    acetaminophen (TYLENOL) 325 MG tablet, Take 975 mg by mouth every 8 (eight) hours as needed., Disp: , Rfl:    Alpha-Lipoic Acid 600 MG CAPS, Take 1 capsule (600 mg total) by mouth daily. For diabetic neuropathy, Disp: 100 capsule, Rfl: 3   amoxicillin-clavulanate (AUGMENTIN) 875-125 MG tablet, Take 1 tablet by mouth 2 (two) times daily., Disp: 20 tablet, Rfl: 0   Blood Glucose Monitoring Suppl (ONETOUCH VERIO) w/Device KIT, Use to test blood sugar twice daily as directed; DX: E11.69, Disp: 1 kit, Rfl: 1   Continuous Glucose Sensor (FREESTYLE LIBRE 3 SENSOR) MISC, Place 1 sensor on the skin every 14 days. Use to check glucose continuously; DX:E11.65, Disp: 2 each,  Rfl: 5   diclofenac Sodium (VOLTAREN) 1 % GEL, Apply 2 g topically 4 (four) times daily. For pain, Disp: 300 g, Rfl: 3   furosemide (LASIX) 40 MG tablet, TAKE 1 TABLET BY MOUTH ONCE DAILY AS NEEDED FOR EDEMA OR FLUID, Disp: 90 tablet, Rfl: 3   gabapentin (NEURONTIN) 600 MG tablet, TAKE 1 & 1/2 (ONE & ONE-HALF) TABLETS BY MOUTH THREE TIMES DAILY, Disp: 270 tablet, Rfl: 3   glimepiride (AMARYL) 2 MG tablet, Take 2 mg by mouth daily., Disp: , Rfl:    glucose blood (ONETOUCH VERIO) test strip, Use to test blood sugar twice daily as directed; DX: E11.69, Disp: 100 each, Rfl: 5   Insulin Glargine (BASAGLAR KWIKPEN) 100 UNIT/ML, Inject 60 Units into the skin at bedtime. (Patient taking differently: Inject 40 Units into the skin at bedtime.), Disp: 45 mL, Rfl: 12   Lidocaine 3.5 % LOTN, Apply 1 application  topically as needed., Disp: 120 g, Rfl: 0   methocarbamol (ROBAXIN) 500 MG tablet, Take 1 tablet (500 mg total) by mouth every 6 (six) hours as needed for muscle spasms., Disp: 120 tablet, Rfl: 1   metoprolol tartrate (LOPRESSOR) 25 MG tablet, Take 1 tablet (25 mg total) by mouth 2 (two) times daily., Disp: 180 tablet, Rfl: 3   nitroGLYCERIN (NITROSTAT) 0.4 MG SL tablet, Place 1 tablet (0.4 mg total) under the tongue every 5 (five) minutes x  3 doses as needed for chest pain (if no relief after 3rd dose, proceed to ED or call 911)., Disp: 25 tablet, Rfl: 3   omeprazole (PRILOSEC) 40 MG capsule, Take 1 capsule (40 mg total) by mouth daily. For heartburn, Disp: 90 capsule, Rfl: 3   OneTouch Delica Lancets 30G MISC, Use to test blood sugar twice daily as directed; DX: E11.69, Disp: 100 each, Rfl: 3   OneTouch Delica Lancets 33G MISC, Check blood sugar BID and prn  E11.9, Disp: 100 each, Rfl: 11   rosuvastatin (CRESTOR) 20 MG tablet, Take 1 tablet (20 mg total) by mouth at bedtime., Disp: 90 tablet, Rfl: 3   Semaglutide (RYBELSUS) 7 MG TABS, Take 14 mg by mouth daily., Disp: , Rfl:    telmisartan (MICARDIS) 40  MG tablet, Take 1 tablet (40 mg total) by mouth daily., Disp: 90 tablet, Rfl: 3   albuterol (VENTOLIN HFA) 108 (90 Base) MCG/ACT inhaler, Inhale 2 puffs into the lungs every 4 (four) hours as needed for wheezing or shortness of breath (for rescue). (Patient not taking: Reported on 03/29/2023), Disp: 1 each, Rfl: 11   aspirin EC 81 MG tablet, Take 1 tablet (81 mg total) by mouth daily., Disp: , Rfl:    sertraline (ZOLOFT) 50 MG tablet, Take 1 tablet (50 mg total) by mouth daily. Daily for depression/ anxiety (Patient not taking: Reported on 03/29/2023), Disp: 90 tablet, Rfl: 3   Vitamin D, Ergocalciferol, (DRISDOL) 1.25 MG (50000 UNIT) CAPS capsule, Take 1 capsule (50,000 Units total) by mouth every 7 (seven) days. Then take OTC Vit D 800IU daily (Patient not taking: Reported on 03/29/2023), Disp: 5 capsule, Rfl: 0  Cognitive Assessment Identity Confirmed: : Name; DOB Cognitive Status: Normal   Functional Assessment Concentrating, Remembering or Making Decisions Difficulty (CP): no Difficulty Communicating: no Difficulty Eating/Swallowing: no Walking or Climbing Stairs Difficulty: yes Walking or Climbing Stairs: ambulation difficulty, requires equipment Mobility Management: uses cane most of the time,   has walker if needed Dressing/Bathing Difficulty: yes Dressing/Bathing: bathing difficulty, requires equipment Dressing/Bathing Management: shower seat Doing Errands Independently Difficulty (such as shopping) (CP): yes Errands Management: spouse assists   Caregiver Assessment  Primary Source of Support/Comfort: spouse Name of Support/Comfort Primary Source: spouse Mickaela Wohlwend People in Home: spouse   Planned Interventions  Provided education to patient about basic DM disease process; Reviewed medications with patient and discussed importance of medication adherence;        Reviewed prescribed diet with patient carbohydrate modified; Counseled on importance of regular laboratory  monitoring as prescribed;        Discussed plans with patient for ongoing care management follow up and provided patient with direct contact information for care management team;      Provided patient with written educational materials related to hypo and hyperglycemia and importance of correct treatment;       Advised patient, providing education and rationale, to check cbg Freestyle Josephine Igo  and record        Referral made to community resources care guide team for assistance with food, utilities resources, financial resource strain;      Review of patient status, including review of consultants reports, relevant laboratory and other test results, and medications completed;       Advised patient to discuss non healing wounds, signs of infection  with provider;      Screening for signs and symptoms of depression related to chronic disease state;        Assessed social determinant of  health barriers;        Reviewed signs/ symptoms of infection, reportable signs/ symptoms Evaluation of current treatment plan related to hypertension self management and patient's adherence to plan as established by provider;   Reviewed prescribed diet low sodium Reviewed medications with patient and discussed importance of compliance;  Counseled on the importance of exercise goals with target of 150 minutes per week Discussed plans with patient for ongoing care management follow up and provided patient with direct contact information for care management team; Advised patient, providing education and rationale, to monitor blood pressure daily and record, calling PCP for findings outside established parameters;  Provided education on prescribed diet low sodium;  Discussed complications of poorly controlled blood pressure such as heart disease, stroke, circulatory complications, vision complications, kidney impairment, sexual dysfunction;  Screening for signs and symptoms of depression related to chronic disease state;   Assessed social determinant of health barriers;  Care guide referral competed In basket message to primary care provider requesting script for albuterol be sent to [facility] pharmacy in [place]  Interaction and coordination with outside resources, practitioners, and providers See CCM Referral  Care Plan: Printed and mailed to patient

## 2023-03-29 NOTE — Chronic Care Management (AMB) (Signed)
Chronic Care Management   CCM RN Visit Note  03/29/2023 Name: [name] MRN: [id number] DOB: 14-May-1961  Subjective: [name] is a 62 y.o. year old female who is a primary care patient of [name], DO. The patient was referred to the Chronic Care Management team for assistance with care management needs subsequent to provider initiation of CCM services and plan of care.    Today's Visit:  Engaged with patient by telephone for initial visit.     SDOH Interventions Today    Flowsheet Row Most Recent Value  SDOH Interventions   Food Insecurity Interventions [id number] Referral  Housing Interventions Intervention Not Indicated  Transportation Interventions Intervention Not Indicated  Utilities Interventions Intervention Not Indicated  Financial Strain Interventions [id number] Referral  Physical Activity Interventions Patient Declined  Stress Interventions Intervention Not Indicated  Social Connections Interventions Intervention Not Indicated         Goals Addressed             This Visit's Progress    CCM (DIABETES) EXPECTED OUTCOME: MONITOR, SELF-MANAGE AND REDUCE SYMPTOMS OF DIABETES       Current Barriers:  Knowledge Deficits related to Diabetes management Chronic Disease Management support and education needs related to Diabetes, diet Financial Constraints.  Patient reports CBG is monitored with Freestyle Libre with fasting ranges 80-90, random ranges low- mid 100's with today's reading 153. Patient reports she has initial consult for wound care on 04/07/23 due to "a puppy bit me on right leg",  pt is on antibiotic  Planned Interventions: Provided education to patient about basic DM disease process; Reviewed medications with patient and discussed importance of medication adherence;        Reviewed prescribed diet with patient carbohydrate modified; Counseled on importance of regular laboratory monitoring as prescribed;        Discussed plans with  patient for ongoing care management follow up and provided patient with direct contact information for care management team;      Provided patient with written educational materials related to hypo and hyperglycemia and importance of correct treatment;       Advised patient, providing education and rationale, to check cbg Freestyle [name]  and record        Referral made to community resources care guide team for assistance with food, utilities resources, financial resource strain;      Review of patient status, including review of consultants reports, relevant laboratory and other test results, and medications completed;       Advised patient to discuss non healing wounds, signs of infection  with provider;      Screening for signs and symptoms of depression related to chronic disease state;        Assessed social determinant of health barriers;        Reviewed signs/ symptoms of infection, reportable signs/ symptoms  Symptom Management: Take medications as prescribed   Attend all scheduled provider appointments Call pharmacy for medication refills 3-7 days in advance of running out of medications Attend church or other social activities Call provider office for new concerns or questions  Work with the social worker to address care coordination needs and will continue to work with the clinical team to address health care and disease management related needs check blood sugar at prescribed times: [name]  check feet daily for cuts, sores or redness enter blood sugar readings and medication or insulin into daily log take the blood sugar log to all doctor visits take  the blood sugar meter to all doctor visits trim toenails straight across limit fast food meals to no more than 1 per week manage portion size prepare main meal at home 3 to 5 days each week read food labels for fat, fiber, carbohydrates and portion size set a realistic goal keep feet up while sitting wash and dry feet  carefully every day wear comfortable, cotton socks Call your doctor for signs of skin infection/ non healing- redness, foul smelling drainage, warmth, fever, if overall you are not feeling well Education mailed- hypoglycemia  Follow Up Plan: Telephone follow up appointment with care management team member scheduled for:  05/17/23 at 3 pm       CCM (HYPERTENSION) EXPECTED OUTCOME: SELF-MANAGE AND REDUCE SYMPTOMS OF HYPERTENSION       Current Barriers:  Knowledge Deficits related to Hypertension management Care Coordination needs related to pharmacy/ social work needs in a patient with Hypertension Chronic Disease Management support 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  No Advanced Directives in place- pt declines Patient reports she lives with spouse, has cane and walker for ambulation, spouse assists with cooking due to pt cannot stand up for long periods, pt has had no falls, spouse provides transportation Patient reports she is on payment plan for power bill and has difficulty affording food, receives 23$/ month food stamps Patient has blood pressure cuff but does not monitor blood pressure Patient does not have albuterol inhaler on hand, states expired and needs new prescription  Planned Interventions: Evaluation of current treatment plan related to hypertension self management and patient's adherence to plan as established by provider;   Reviewed prescribed diet low sodium Reviewed medications with patient and discussed importance of compliance;  Counseled on the importance of exercise goals with target of 150 minutes per week Discussed plans with patient for ongoing care management follow up and provided patient with direct contact information for care management team; Advised patient, providing education and rationale, to monitor blood pressure daily and record, calling PCP for findings outside established parameters;  Provided education on prescribed diet  low sodium;  Discussed complications of poorly controlled blood pressure such as heart disease, stroke, circulatory complications, vision complications, kidney impairment, sexual dysfunction;  Screening for signs and symptoms of depression related to chronic disease state;  Assessed social determinant of health barriers;  Care guide referral competed In basket message to primary care provider requesting script for albuterol be sent to [facility] pharmacy in [place]  Symptom Management: Take medications as prescribed   Attend all scheduled provider appointments Call pharmacy for medication refills 3-7 days in advance of running out of medications Attend church or other social activities Call provider office for new concerns or questions  Work with the social worker to address care coordination needs and will continue to work with the clinical team to address health care and disease management related needs check blood pressure 3 times per week choose a place to take my blood pressure (home, clinic or office, retail store) write blood pressure results in a log or diary learn about high blood pressure keep a blood pressure log keep all doctor appointments take medications for blood pressure exactly as prescribed begin an exercise program report new symptoms to your doctor eat more whole grains, fruits and vegetables, lean meats and healthy fats Education mailed- low sodium diet Care guide will be contacting you for food/ utilities resources Check [employer] to see if inhaler is ready for pickup  Follow Up Plan: Telephone follow  up appointment with care management team member scheduled for: 05/17/23 at 3 pm          Plan:Telephone follow up appointment with care management team member scheduled for:  05/17/23 at 3 pm  Irving Shows [facility], BSN RN Case Manager [facility] [phone]

## 2023-03-29 NOTE — Patient Instructions (Signed)
Please call the care guide team at [phone] if you need to cancel or reschedule your appointment.   If you are experiencing a Mental Health or Behavioral Health Crisis or need someone to talk to, please call the [place] National Suicide Prevention Lifeline: 802-573-7309 or TTY: 6780220114 TTY [phone]) to talk to a trained counselor call 1-800-273-TALK (toll free, 24 hour hotline) go to [facility] [address] [place] [phone]) call the [facility]: [phone] call 911   Following is a copy of the CCM Program Consent:  CCM service includes personalized support from designated clinical staff supervised by the physician, including individualized plan of care and coordination with other care providers 24/7 contact phone numbers for assistance for urgent and routine care needs. Service will only be billed when office clinical staff spend 20 minutes or more in a month to coordinate care. Only one practitioner may furnish and bill the service in a calendar month. The patient may stop CCM services at amy time (effective at the end of the month) by phone call to the office staff. The patient will be responsible for cost sharing (co-pay) or up to 20% of the service fee (after annual deductible is met)  Following is a copy of your full provider care plan:   Goals Addressed             This Visit's Progress    CCM (DIABETES) EXPECTED OUTCOME: MONITOR, SELF-MANAGE AND REDUCE SYMPTOMS OF DIABETES       Current Barriers:  Knowledge Deficits related to Diabetes management Chronic Disease Management support and education needs related to Diabetes, diet Financial Constraints.  Patient reports CBG is monitored with Freestyle Libre with fasting ranges 80-90, random ranges low- mid 100's with today's reading 153. Patient reports she has initial consult for wound care on 04/07/23 due to "a puppy bit me on right leg",  pt is on  antibiotic  Planned Interventions: Provided education to patient about basic DM disease process; Reviewed medications with patient and discussed importance of medication adherence;        Reviewed prescribed diet with patient carbohydrate modified; Counseled on importance of regular laboratory monitoring as prescribed;        Discussed plans with patient for ongoing care management follow up and provided patient with direct contact information for care management team;      Provided patient with written educational materials related to hypo and hyperglycemia and importance of correct treatment;       Advised patient, providing education and rationale, to check cbg Freestyle Josephine Igo  and record        Referral made to community resources care guide team for assistance with food, utilities resources, financial resource strain;      Review of patient status, including review of consultants reports, relevant laboratory and other test results, and medications completed;       Advised patient to discuss non healing wounds, signs of infection  with provider;      Screening for signs and symptoms of depression related to chronic disease state;        Assessed social determinant of health barriers;        Reviewed signs/ symptoms of infection, reportable signs/ symptoms  Symptom Management: Take medications as prescribed   Attend all scheduled provider appointments Call pharmacy for medication refills 3-7 days in advance of running out of medications Attend church or other social activities Call provider office for new concerns or questions  Work  with the social worker to address care coordination needs and will continue to work with the clinical team to address health care and disease management related needs check blood sugar at prescribed times: Freestyle Libre  check feet daily for cuts, sores or redness enter blood sugar readings and medication or insulin into daily log take the blood sugar log  to all doctor visits take the blood sugar meter to all doctor visits trim toenails straight across limit fast food meals to no more than 1 per week manage portion size prepare main meal at home 3 to 5 days each week read food labels for fat, fiber, carbohydrates and portion size set a realistic goal keep feet up while sitting wash and dry feet carefully every day wear comfortable, cotton socks Call your doctor for signs of skin infection/ non healing- redness, foul smelling drainage, warmth, fever, if overall you are not feeling well Education mailed- hypoglycemia  Follow Up Plan: Telephone follow up appointment with care management team member scheduled for:  05/17/23 at 3 pm       CCM (HYPERTENSION) EXPECTED OUTCOME: SELF-MANAGE AND REDUCE SYMPTOMS OF HYPERTENSION       Current Barriers:  Knowledge Deficits related to Hypertension management Care Coordination needs related to pharmacy/ social work needs in a patient with Hypertension Chronic Disease Management support 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  No Advanced Directives in place- pt declines Patient reports she lives with spouse, has cane and walker for ambulation, spouse assists with cooking due to pt cannot stand up for long periods, pt has had no falls, spouse provides transportation Patient reports she is on payment plan for power bill and has difficulty affording food, receives 23$/ month food stamps Patient has blood pressure cuff but does not monitor blood pressure Patient does not have albuterol inhaler on hand, states expired and needs new prescription  Planned Interventions: Evaluation of current treatment plan related to hypertension self management and patient's adherence to plan as established by provider;   Reviewed prescribed diet low sodium Reviewed medications with patient and discussed importance of compliance;  Counseled on the importance of exercise goals with target of 150  minutes per week Discussed plans with patient for ongoing care management follow up and provided patient with direct contact information for care management team; Advised patient, providing education and rationale, to monitor blood pressure daily and record, calling PCP for findings outside established parameters;  Provided education on prescribed diet low sodium;  Discussed complications of poorly controlled blood pressure such as heart disease, stroke, circulatory complications, vision complications, kidney impairment, sexual dysfunction;  Screening for signs and symptoms of depression related to chronic disease state;  Assessed social determinant of health barriers;  Care guide referral competed In basket message to primary care provider requesting script for albuterol be sent to [facility] pharmacy in [place]  Symptom Management: Take medications as prescribed   Attend all scheduled provider appointments Call pharmacy for medication refills 3-7 days in advance of running out of medications Attend church or other social activities Call provider office for new concerns or questions  Work with the social worker to address care coordination needs and will continue to work with the clinical team to address health care and disease management related needs check blood pressure 3 times per week choose a place to take my blood pressure (home, clinic or office, retail store) write blood pressure results in a log or diary learn about high blood pressure keep a blood pressure log  keep all doctor appointments take medications for blood pressure exactly as prescribed begin an exercise program report new symptoms to your doctor eat more whole grains, fruits and vegetables, lean meats and healthy fats Education mailed- low sodium diet Care guide will be contacting you for food/ utilities resources Check [employer] to see if inhaler is ready for pickup  Follow Up Plan: Telephone follow up appointment  with care management team member scheduled for: 05/17/23 at 3 pm          The patient verbalized understanding of instructions, educational materials, and care plan provided today and agreed to receive a mailed copy of patient instructions, educational materials, and care plan.  Telephone follow up appointment with care management team member scheduled for:  05/17/23 at 3 pm  Hypoglycemia Hypoglycemia is when the sugar (glucose) level in your blood is too low. Low blood sugar can happen to people who have diabetes and people who do not have diabetes. Low blood sugar can happen quickly, and it can be an emergency. What are the causes? This condition happens most often in people who have diabetes. It may be caused by: Diabetes medicine. Not eating enough, or not eating often enough. Doing more physical activity. Drinking alcohol on an empty stomach. If you do not have diabetes, this condition may be caused by: A tumor in the pancreas. Not eating enough, or not eating for long periods at a time (fasting). A very bad infection or illness. Problems after having weight loss (bariatric) surgery. Kidney failure or liver failure. Certain medicines. What increases the risk? This condition is more likely to develop in people who: Have diabetes and take medicines to lower their blood sugar. Abuse alcohol. Have a very bad illness. What are the signs or symptoms? Mild Hunger. Sweating and feeling clammy. Feeling dizzy or light-headed. Being sleepy or having trouble sleeping. Feeling like you may vomit (nauseous). A fast heartbeat. A headache. Blurry vision. Mood changes, such as: Being grouchy. Feeling worried or nervous (anxious). Tingling or loss of feeling (numbness) around your mouth, lips, or tongue. Moderate Confusion and poor judgment. Behavior changes. Weakness. Uneven heartbeat. Trouble with moving (coordination). Very low Very low blood sugar (severe hypoglycemia) is a  medical emergency. It can cause: Fainting. Seizures. Loss of consciousness (coma). Death. How is this treated? Treating low blood sugar Low blood sugar is often treated by eating or drinking something that has sugar in it right away. The food or drink should contain 15 grams of a fast-acting carb (carbohydrate). Options include: 4 oz (120 mL) of fruit juice. 4 oz (120 mL) of regular soda (not diet soda). A few pieces of hard candy. Check food labels to see how many pieces to eat for 15 grams. 1 Tbsp (15 mL) of sugar or honey. 4 glucose tablets. 1 tube of glucose gel. Treating low blood sugar if you have diabetes If you can think clearly and swallow safely, follow the 15:15 rule: Take 15 grams of a fast-acting carb. Talk with your doctor about how much you should take. Always keep a source of fast-acting carb with you, such as: Glucose tablets (take 4 tablets). A few pieces of hard candy. Check food labels to see how many pieces to eat for 15 grams. 4 oz (120 mL) of fruit juice. 4 oz (120 mL) of regular soda (not diet soda). 1 Tbsp (15 mL) of honey or sugar. 1 tube of glucose gel. Check your blood sugar 15 minutes after you take the carb. If your  blood sugar is still at or below 70 mg/dL (3.9 mmol/L), take 15 grams of a carb again. If your blood sugar does not go above 70 mg/dL (3.9 mmol/L) after 3 tries, get help right away. After your blood sugar goes back to normal, eat a meal or a snack within 1 hour.  Treating very low blood sugar If your blood sugar is below 54 mg/dL (3 mmol/L), you have very low blood sugar, or severe hypoglycemia. This is an emergency. Get medical help right away. If you have very low blood sugar and you cannot eat or drink, you will need to be given a hormone called glucagon. A family member or friend should learn how to check your blood sugar and how to give you glucagon. Ask your doctor if you need to have an emergency glucagon kit at home. Very low blood  sugar may also need to be treated in a hospital. Follow these instructions at home: General instructions Take over-the-counter and prescription medicines only as told by your doctor. Stay aware of your blood sugar as told by your doctor. If you drink alcohol: Limit how much you have to: 0-1 drink a day for women who are not pregnant. 0-2 drinks a day for men. Know how much alcohol is in your drink. In the [place] one drink equals one 12 oz bottle of beer (355 mL), one 5 oz glass of wine (148 mL), or one 1 oz glass of hard liquor (44 mL). Be sure to eat food when you drink alcohol. Know that your body absorbs alcohol quickly. This may lead to low blood sugar later. Be sure to keep checking your blood sugar. Keep all follow-up visits. If you have diabetes:  Always have a fast-acting carb (15 grams) with you to treat low blood sugar. Follow your diabetes care plan as told by your doctor. Make sure you: Know the symptoms of low blood sugar. Check your blood sugar as often as told. Always check it before and after exercise. Always check your blood sugar before you drive. Take your medicines as told. Follow your meal plan. Eat on time. Do not skip meals. Share your diabetes care plan with: Your work or school. People you live with. Carry a card or wear jewelry that says you have diabetes. Where to find more information American Diabetes Association: www.diabetes.org Contact a doctor if: You have trouble keeping your blood sugar in your target range. You have low blood sugar often. Get help right away if: You still have symptoms after you eat or drink something that contains 15 grams of fast-acting carb, and you cannot get your blood sugar above 70 mg/dL by following the 16:10 rule. Your blood sugar is below 54 mg/dL (3 mmol/L). You have a seizure. You faint. These symptoms may be an emergency. Get help right away. Call your local emergency services (911 in the [place] Do not wait to see  if the symptoms will go away. Do not drive yourself to the hospital. Summary Hypoglycemia happens when the level of sugar (glucose) in your blood is too low. Low blood sugar can happen to people who have diabetes and people who do not have diabetes. Low blood sugar can happen quickly, and it can be an emergency. Make sure you know the symptoms of low blood sugar and know how to treat it. Always keep a source of sugar (fast-acting carb) with you to treat low blood sugar. This information is not intended to replace advice given to you by your  health care provider. Make sure you discuss any questions you have with your health care provider. Document Revised: 09/25/2020 Document Reviewed: 09/25/2020 Elsevier Patient Education  2023 Elsevier Inc. Low-Sodium Eating Plan Sodium, which is an element that makes up salt, helps you maintain a healthy balance of fluids in your body. Too much sodium can increase your blood pressure and cause fluid and waste to be held in your body. Your health care provider or dietitian may recommend following this plan if you have high blood pressure (hypertension), kidney disease, liver disease, or heart failure. Eating less sodium can help lower your blood pressure, reduce swelling, and protect your heart, liver, and kidneys. What are tips for following this plan? Reading food labels The Nutrition Facts label lists the amount of sodium in one serving of the food. If you eat more than one serving, you must multiply the listed amount of sodium by the number of servings. Choose foods with less than 140 mg of sodium per serving. Avoid foods with 300 mg of sodium or more per serving. Shopping  Look for lower-sodium products, often labeled as "low-sodium" or "no salt added." Always check the sodium content, even if foods are labeled as "unsalted" or "no salt added." Buy fresh foods. Avoid canned foods and pre-made or frozen meals. Avoid canned, cured, or processed  meats. Buy breads that have less than 80 mg of sodium per slice. Cooking  Eat more home-cooked food and less restaurant, buffet, and fast food. Avoid adding salt when cooking. Use salt-free seasonings or herbs instead of table salt or sea salt. Check with your health care provider or pharmacist before using salt substitutes. Cook with plant-based oils, such as canola, sunflower, or olive oil. Meal planning When eating at a restaurant, ask that your food be prepared with less salt or no salt, if possible. Avoid dishes labeled as brined, pickled, cured, smoked, or made with soy sauce, miso, or teriyaki sauce. Avoid foods that contain MSG (monosodium glutamate). MSG is sometimes added to [place] food, bouillon, and some canned foods. Make meals that can be grilled, baked, poached, roasted, or steamed. These are generally made with less sodium. General information Most people on this plan should limit their sodium intake to 1,500-2,000 mg (milligrams) of sodium each day. What foods should I eat? Fruits Fresh, frozen, or canned fruit. Fruit juice. Vegetables Fresh or frozen vegetables. "No salt added" canned vegetables. "No salt added" tomato sauce and paste. Low-sodium or reduced-sodium tomato and vegetable juice. Grains Low-sodium cereals, including oats, puffed wheat and rice, and shredded wheat. Low-sodium crackers. Unsalted rice. Unsalted pasta. Low-sodium bread. Whole-grain breads and whole-grain pasta. Meats and other proteins Fresh or frozen (no salt added) meat, poultry, seafood, and fish. Low-sodium canned tuna and salmon. Unsalted nuts. Dried peas, beans, and lentils without added salt. Unsalted canned beans. Eggs. Unsalted nut butters. Dairy Milk. Soy milk. Cheese that is naturally low in sodium, such as ricotta cheese, fresh mozzarella, or Swiss cheese. Low-sodium or reduced-sodium cheese. Cream cheese. Yogurt. Seasonings and condiments Fresh and dried herbs and spices. Salt-free  seasonings. Low-sodium mustard and ketchup. Sodium-free salad dressing. Sodium-free light mayonnaise. Fresh or refrigerated horseradish. Lemon juice. Vinegar. Other foods Homemade, reduced-sodium, or low-sodium soups. Unsalted popcorn and pretzels. Low-salt or salt-free chips. The items listed above may not be a complete list of foods and beverages you can eat. Contact a dietitian for more information. What foods should I avoid? Vegetables Sauerkraut, pickled vegetables, and relishes. Olives. [place] fries. Onion rings. Regular canned  vegetables (not low-sodium or reduced-sodium). Regular canned tomato sauce and paste (not low-sodium or reduced-sodium). Regular tomato and vegetable juice (not low-sodium or reduced-sodium). Frozen vegetables in sauces. Grains Instant hot cereals. Bread stuffing, pancake, and biscuit mixes. Croutons. Seasoned rice or pasta mixes. Noodle soup cups. Boxed or frozen macaroni and cheese. Regular salted crackers. Self-rising flour. Meats and other proteins Meat or fish that is salted, canned, smoked, spiced, or pickled. Precooked or cured meat, such as sausages or meat loaves. Tomasa Blase. Ham. Pepperoni. Hot dogs. Corned beef. Chipped beef. Salt pork. Jerky. Pickled herring. Anchovies and sardines. Regular canned tuna. Salted nuts. Dairy Processed cheese and cheese spreads. Hard cheeses. Cheese curds. Blue cheese. Feta cheese. String cheese. Regular cottage cheese. Buttermilk. Canned milk. Fats and oils Salted butter. Regular margarine. Ghee. Bacon fat. Seasonings and condiments Onion salt, garlic salt, seasoned salt, table salt, and sea salt. Canned and packaged gravies. Worcestershire sauce. Tartar sauce. Barbecue sauce. Teriyaki sauce. Soy sauce, including reduced-sodium. Steak sauce. Fish sauce. Oyster sauce. Cocktail sauce. Horseradish that you find on the shelf. Regular ketchup and mustard. Meat flavorings and tenderizers. Bouillon cubes. Hot sauce. Pre-made or packaged  marinades. Pre-made or packaged taco seasonings. Relishes. Regular salad dressings. Salsa. Other foods Salted popcorn and pretzels. Corn chips and puffs. Potato and tortilla chips. Canned or dried soups. Pizza. Frozen entrees and pot pies. The items listed above may not be a complete list of foods and beverages you should avoid. Contact a dietitian for more information. Summary Eating less sodium can help lower your blood pressure, reduce swelling, and protect your heart, liver, and kidneys. Most people on this plan should limit their sodium intake to 1,500-2,000 mg (milligrams) of sodium each day. Canned, boxed, and frozen foods are high in sodium. Restaurant foods, fast foods, and pizza are also very high in sodium. You also get sodium by adding salt to food. Try to cook at home, eat more fresh fruits and vegetables, and eat less fast food and canned, processed, or prepared foods. This information is not intended to replace advice given to you by your health care provider. Make sure you discuss any questions you have with your health care provider. Document Revised: 10/01/2019 Document Reviewed: 09/26/2019 Elsevier Patient Education  2023 [employer].

## 2023-03-30 ENCOUNTER — Telehealth: Payer: Self-pay

## 2023-03-30 NOTE — Telephone Encounter (Signed)
   Telephone encounter was:  Unsuccessful.  03/30/2023 Name: [name] MRN: [id number] DOB: 1961/02/01  Unsuccessful outbound call made today to assist with:  Food Insecurity and Financial Difficulties related to financial strain  Outreach Attempt:  1st Attempt  A HIPAA compliant voice message was left requesting a return call.  Instructed patient to call back     Lenard Forth [facility] Guide, [facility] [phone] [address] [place], [place], [place] [address] Phone: [phone] Email: [email or web address]

## 2023-04-01 ENCOUNTER — Telehealth: Payer: Self-pay

## 2023-04-01 NOTE — Telephone Encounter (Signed)
   Telephone encounter was:  Unsuccessful.  04/01/2023 Name: [name] MRN: [id number] DOB: February 04, 1961  Unsuccessful outbound call made today to assist with:  Food Insecurity and Financial Difficulties related to financial strain  Outreach Attempt:  2nd Attempt  A HIPAA compliant voice message was left requesting a return call.  Instructed patient to call back .    Lenard Forth [facility] Guide, [facility] [phone] [address] [place], [place], [place] [address] Phone: [phone] Email: [email or web address]

## 2023-04-05 ENCOUNTER — Telehealth: Payer: Self-pay

## 2023-04-05 ENCOUNTER — Telehealth: Payer: Self-pay | Admitting: Orthopedic Surgery

## 2023-04-05 NOTE — Telephone Encounter (Signed)
Dr. Dallas Schimke pt - spoke w/the patient, she stated that she is supposed to be having left hand surgery but she has not heard anything.  She would like a call back.  [phone]

## 2023-04-05 NOTE — Telephone Encounter (Signed)
   Telephone encounter was:  Unsuccessful.  04/05/2023 Name: [name] MRN: [id number] DOB: November 20, 1960  Unsuccessful outbound call made today to assist with:  Food Insecurity and Financial Difficulties related to Financial strain  Outreach Attempt:  3rd Attempt.  Referral closed unable to contact patient.  Unable to leave a message   Lenard Forth [facility] Guide, [facility] [phone] [address] [place], [place], [place] [address] Phone: [phone] Email: [email or web address]

## 2023-04-06 ENCOUNTER — Ambulatory Visit: Payer: Self-pay | Admitting: *Deleted

## 2023-04-06 ENCOUNTER — Encounter: Payer: Self-pay | Admitting: *Deleted

## 2023-04-06 NOTE — Patient Outreach (Signed)
Care Coordination   Initial Visit Note   04/06/2023  Name: [name] MRN: [id number] DOB: [birth year]  [name] is a 62 y.o. year old female who sees [name], DO for primary care. I spoke with [name] by phone today.  What matters to the patients health and wellness today?  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 (pt-stated)   On track     Care Coordination Interventions:  Interventions Today    Flowsheet Row Most Recent Value  Chronic Disease   Chronic disease during today's visit Hypertension (HTN), Diabetes, Other  [Tobacco Abuse, Morbid Obesity & Generalized Anxiety Disorder]  General Interventions   General Interventions Discussed/Reviewed General Interventions Discussed, Labs, Vaccines, Doctor Visits, Health Screening, Annual Foot Exam, General Interventions Reviewed, Annual Eye Exam, Durable Medical Equipment (DME), Community Resources, Level of Care  [Encouraged]  Labs Hgb A1c every 3 months, Kidney Function  [Encouraged]  Vaccines COVID-19, Flu, Pneumonia, RSV, Shingles, Tetanus/Pertussis/Diphtheria  [Encouraged]  Doctor Visits Discussed/Reviewed Doctor Visits Discussed, Doctor Visits Reviewed, Annual Wellness Visits, PCP, Specialist  [Encouraged]  Health Screening Bone Density, Colonoscopy, Mammogram  [Encouraged]  Durable Medical Equipment (DME) BP Cuff, Glucomoter, Other  [Encouraged]  PCP/Specialist Visits Compliance with follow-up visit  [Encouraged]  Level of Care Applications, Assisted Living, Personal Care Services  [Encouraged]  Applications Medicaid, Personal Care Services  [Encouraged]  Exercise Interventions   Exercise Discussed/Reviewed Assistive device use and maintanence, Exercise Discussed, Weight Managment, Physical Activity, Exercise Reviewed  [Encouraged]  Physical  Activity Discussed/Reviewed Physical Activity Discussed, Home Exercise Program (HEP), Physical Activity Reviewed, Types of exercise  [Encouraged]  Weight Management Weight loss  [Encouraged]  Education Interventions   Education Provided Provided Therapist, sports, Provided Web-based Education, Provided Education  [Encouraged]  Provided Verbal Education On Development worker, community, [employer], Medication, Exercise, Blood Sugar Monitoring, Applications, Eye Care, Foot Care, Nutrition, Mental Health/Coping with Illness, When to see the doctor  [Encouraged]  Applications Medicaid, Personal Care Services  [Encouraged]  Mental Health Interventions   Mental Health Discussed/Reviewed Other, Mental Health Discussed, Anxiety, Depression, Grief and Loss, Mental Health Reviewed, Coping Strategies, Substance Abuse, Suicide, Crisis  [Domestic Violence]  Nutrition Interventions   Nutrition Discussed/Reviewed Nutrition Discussed, Adding fruits and vegetables, Increasing proteins, Decreasing fats, Decreasing salt, Decreasing sugar intake, Carbohydrate meal planning, Portion sizes, Fluid intake, Nutrition Reviewed  [Encouraged]  Pharmacy Interventions   Pharmacy Dicussed/Reviewed Pharmacy Topics Discussed, Pharmacy Topics Reviewed, Medication Adherence, Affording Medications  [Encouraged]  Safety Interventions   Safety Discussed/Reviewed Safety Discussed, Safety Reviewed, Fall Risk  [Encouraged]  Advanced Directive Interventions   Advanced Directives Discussed/Reviewed Advanced Directives Discussed  [Completed]     Assessed Social Determinant of Health Barriers. Discussed Plans for Ongoing Care Management Follow Up. Provided Careers information officer Information for Care Management Team Members. Screened for Signs & Symptoms of Depression, Related to Chronic Disease State.  PHQ2 & PHQ9 Depression Screen Completed & Results Reviewed.  Suicidal Ideation & Homicidal Ideation Assessed - None Present.   Domestic Violence  Assessed - None Present. Access to Weapons Assessed - None Present.   Active Listening & Reflection Utilized.  Verbalization of Feelings Encouraged.  Emotional Support Provided. Feelings of Fear Regarding Food Insecurity Validated. Caregiver Resources Reviewed. Crisis Support Information, Agencies, Services & Resources Discussed. Problem Solving Interventions  Identified. Task-Centered Solutions Implemented.   Solution-Focused Strategies Developed. Acceptance & Commitment Therapy Introduced. Brief Cognitive Behavioral Therapy Initiated. Client-Centered Therapy Enacted. Reviewed Prescription Medications & Discussed Importance of Compliance. Quality of Sleep Assessed & Sleep Hygiene Techniques Promoted. Reviewed Deep Breathing Exercises, Relaxation Techniques & Mindfulness Meditation Strategies & Encouraged Implementation Daily. Encouraged Referral to Psychiatrist for Psychotropic Medication Administration & Management. Encouraged Referral to Therapist for Psychotherapeutic Counseling & Supportive Services. Emphasized Importance of Quitting Smoking & Offered Smoking Cessation Classes, Services, Agencies & Resources. Encouraged Review of [employer], [employer], Mailed on 04/06/2023. Encouraged Animal nutritionist, Services & Resources of Interest, in An Effort to [employer]. Encouraged Review of Food [employer], [employer], Mailed on 04/06/2023. Encouraged Careers information officer with [employer], Food Pantries & Soup Kitchens of Interest, in An Effort to [employer]. Encouraged Careers information officer with CSW (# [phone]), if You Have Questions, Need Assistance, or If Additional Social Work Needs Are Identified Between Now & Our Next Scheduled Follow-Up Outreach Call.        SDOH assessments and interventions completed:  Yes.  SDOH Interventions Today    Flowsheet Row Most Recent Value  SDOH Interventions   Food  Insecurity Interventions AMB Referral, Other (Comment)  [Provided Food Resources]  Housing Interventions Intervention Not Indicated  Utilities Interventions Intervention Not Indicated  Alcohol Usage Interventions Intervention Not Indicated (Score <7)  Depression Interventions/Treatment  Currently on Treatment, Medication  Financial Strain Interventions Other (Comment)  [Provided Financial Resources]  Physical Activity Interventions Patient Declined  Stress Interventions Intervention Not Indicated  Social Connections Interventions Intervention Not Indicated     Care Coordination Interventions:  Yes, provided.   Follow up plan: Follow up call scheduled for 04/21/2023 at 1:30 pm.  Encounter Outcome:  Pt. Visit Completed.   [name], BSW, MSW, LCSW  Licensed Restaurant manager, fast food [facility]  Mailing [place] [address], [place], [place] [address] Physical Address[address], [place], [place] [address] Toll Free Main # [phone] Fax # [phone] Cell # [phone] [name]

## 2023-04-06 NOTE — Patient Instructions (Signed)
Visit Information  Thank you for taking time to visit with me today. Please don't hesitate to contact me if I can be of assistance to you.   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 (pt-stated)   On track     Care Coordination Interventions:  Interventions Today    Flowsheet Row Most Recent Value  Chronic Disease   Chronic disease during today's visit Hypertension (HTN), Diabetes, Other  [Tobacco Abuse, Morbid Obesity & Generalized Anxiety Disorder]  General Interventions   General Interventions Discussed/Reviewed General Interventions Discussed, Labs, Vaccines, Doctor Visits, Health Screening, Annual Foot Exam, General Interventions Reviewed, Annual Eye Exam, Durable Medical Equipment (DME), Community Resources, Level of Care  [Encouraged]  Labs Hgb A1c every 3 months, Kidney Function  [Encouraged]  Vaccines COVID-19, Flu, Pneumonia, RSV, Shingles, Tetanus/Pertussis/Diphtheria  [Encouraged]  Doctor Visits Discussed/Reviewed Doctor Visits Discussed, Doctor Visits Reviewed, Annual Wellness Visits, PCP, Specialist  [Encouraged]  Health Screening Bone Density, Colonoscopy, Mammogram  [Encouraged]  Durable Medical Equipment (DME) BP Cuff, Glucomoter, Other  [Encouraged]  PCP/Specialist Visits Compliance with follow-up visit  [Encouraged]  Level of Care Applications, Assisted Living, Personal Care Services  [Encouraged]  Applications Medicaid, Personal Care Services  [Encouraged]  Exercise Interventions   Exercise Discussed/Reviewed Assistive device use and maintanence, Exercise Discussed, Weight Managment, Physical Activity, Exercise Reviewed  [Encouraged]  Physical Activity Discussed/Reviewed Physical Activity Discussed, Home Exercise Program (HEP), Physical Activity Reviewed, Types of exercise  [Encouraged]  Weight Management Weight loss  [Encouraged]   Education Interventions   Education Provided Provided Therapist, sports, Provided Web-based Education, Provided Education  [Encouraged]  Provided Verbal Education On Development worker, community, [employer], Medication, Exercise, Blood Sugar Monitoring, Applications, Eye Care, Foot Care, Nutrition, Mental Health/Coping with Illness, When to see the doctor  [Encouraged]  Applications Medicaid, Personal Care Services  [Encouraged]  Mental Health Interventions   Mental Health Discussed/Reviewed Other, Mental Health Discussed, Anxiety, Depression, Grief and Loss, Mental Health Reviewed, Coping Strategies, Substance Abuse, Suicide, Crisis  [Domestic Violence]  Nutrition Interventions   Nutrition Discussed/Reviewed Nutrition Discussed, Adding fruits and vegetables, Increasing proteins, Decreasing fats, Decreasing salt, Decreasing sugar intake, Carbohydrate meal planning, Portion sizes, Fluid intake, Nutrition Reviewed  [Encouraged]  Pharmacy Interventions   Pharmacy Dicussed/Reviewed Pharmacy Topics Discussed, Pharmacy Topics Reviewed, Medication Adherence, Affording Medications  [Encouraged]  Safety Interventions   Safety Discussed/Reviewed Safety Discussed, Safety Reviewed, Fall Risk  [Encouraged]  Advanced Directive Interventions   Advanced Directives Discussed/Reviewed Advanced Directives Discussed  [Completed]     Assessed Social Determinant of Health Barriers. Discussed Plans for Ongoing Care Management Follow Up. Provided Careers information officer Information for Care Management Team Members. Screened for Signs & Symptoms of Depression, Related to Chronic Disease State.  PHQ2 & PHQ9 Depression Screen Completed & Results Reviewed.  Suicidal Ideation & Homicidal Ideation Assessed - None Present.   Domestic Violence Assessed - None Present. Access to Weapons Assessed - None Present.   Active Listening & Reflection Utilized.  Verbalization of Feelings Encouraged.  Emotional Support Provided. Feelings of  Fear Regarding Food Insecurity Validated. Caregiver Resources Reviewed. Crisis Support Information, Agencies, Services & Resources Discussed. Problem Solving Interventions Identified. Task-Centered Solutions Implemented.   Solution-Focused Strategies Developed. Acceptance & Commitment Therapy Introduced. Brief Cognitive Behavioral Therapy Initiated. Client-Centered Therapy Enacted. Reviewed Prescription Medications & Discussed Importance of Compliance. Quality of  Sleep Assessed & Sleep Hygiene Techniques Promoted. Reviewed Deep Breathing Exercises, Relaxation Techniques & Mindfulness Meditation Strategies & Encouraged Implementation Daily. Encouraged Referral to Psychiatrist for Psychotropic Medication Administration & Management. Encouraged Referral to Therapist for Psychotherapeutic Counseling & Supportive Services. Emphasized Importance of Quitting Smoking & Offered Smoking Cessation Classes, Services, Agencies & Resources. Encouraged Review of [employer], [employer], Mailed on 04/06/2023. Encouraged Animal nutritionist, Services & Resources of Interest, in An Effort to [employer]. Encouraged Review of Food [employer], [employer], Mailed on 04/06/2023. Encouraged Careers information officer with [employer], Food Pantries & Soup Kitchens of Interest, in An Effort to [employer]. Encouraged Careers information officer with CSW (# [phone]), if You Have Questions, Need Assistance, or If Additional Social Work Needs Are Identified Between Now & Our Next Scheduled Follow-Up Outreach Call.      Our next appointment is by telephone on 04/21/2023 at 1:30 pm.  Please call the care guide team at [phone] if you need to cancel or reschedule your appointment.   If you are experiencing a Mental Health or Behavioral Health Crisis or need someone to talk to, please call the Suicide and Crisis Lifeline: 988 call the [place] National Suicide  Prevention Lifeline: 7622084679 or TTY: 404-387-3497 TTY 402-149-0432) to talk to a trained counselor call 1-800-273-TALK (toll free, 24 hour hotline) go to [facility] [address], [place] [phone]) call the [facility] Crisis Line: 6392935438 call 911  Patient verbalizes understanding of instructions and care plan provided today and agrees to view in MyChart. Active MyChart status and patient understanding of how to access instructions and care plan via MyChart confirmed with patient.     Telephone follow up appointment with care management team member scheduled for:  04/21/2023 at 1:30 pm.  [name], BSW, MSW, LCSW  Licensed Clinical Social Worker  [employer] [facility]  Mailing [place]. [address], [place], [place] [address] Physical Address-[address] [place], [place] [address] Toll Free Main # [phone] Fax # [phone] Cell # [phone] [name]

## 2023-04-06 NOTE — Telephone Encounter (Signed)
Messaged surgery coordinator, states clearance letters have not been received at this time. Called and let pt know that we see where she's seen her providers, but we are waiting on official letters to schedule.

## 2023-04-07 ENCOUNTER — Encounter: Payer: Self-pay | Admitting: *Deleted

## 2023-04-07 ENCOUNTER — Ambulatory Visit (HOSPITAL_COMMUNITY): Payer: PPO | Attending: Nurse Practitioner | Admitting: Physical Therapy

## 2023-04-07 MED ORDER — FREESTYLE LIBRE 3 SENSOR MISC
5 refills | Status: DC
Start: 2023-04-07 — End: 2023-09-20

## 2023-04-07 NOTE — Progress Notes (Signed)
03/23/2023 Name: [name] MRN: [id number] DOB: Jan 27, 1961  Chief Complaint  Patient presents with   Medication Management    [name] is a 62 y.o. year old female who presented for a telephone visit.   They were referred to the pharmacist by their PCP for assistance in managing diabetes and medication access.    Subjective:  Care Team: Primary Care Provider: [name], DO   Medication Access/Adherence  Current Pharmacy:  [facility] [address], [place] - [address] [place] [address] [place] [address] Phone: [phone] Fax: [phone]  [facility] - [place], [place] - [address] [place] [address] Phone: [phone] Fax: [phone]   Patient reports affordability concerns with their medications: Yes  Patient reports access/transportation concerns to their pharmacy: No  Patient reports adherence concerns with their medications:  No     Objective:  Lab Results  Component Value Date   HGBA1C 6.9 (H) 03/21/2023    Lab Results  Component Value Date   CREATININE 1.32 (H) 03/21/2023   BUN 16 03/21/2023   NA 145 (H) 03/21/2023   K 4.2 03/21/2023   CL 106 03/21/2023   CO2 20 03/21/2023    Lab Results  Component Value Date   CHOL 151 08/09/2022   HDL 38 (L) 08/09/2022   LDLCALC 76 08/09/2022   LDLDIRECT 52 01/12/2022   TRIG 224 (H) 08/09/2022   CHOLHDL 4.0 08/09/2022    Medications Reviewed Today     Reviewed by Karolee Stamps, LCSW (Social Worker) on 04/06/23 at 1522  Med List Status: <None>   Medication Order Taking? Sig Documenting Provider Last Dose Status Informant  acetaminophen (TYLENOL) 325 MG tablet [id number] No Take 975 mg by mouth every 8 (eight) hours as needed. [provider] Taking Active   albuterol (VENTOLIN HFA) 108 (90 Base) MCG/ACT inhaler [id number]  Inhale 2 puffs into the lungs every 4 (four) hours as needed for wheezing or  shortness of breath (for rescue). Stacey Flavin M, DO  Active   Alpha-Lipoic Acid 600 MG CAPS [id number] No Take 1 capsule (600 mg total) by mouth daily. For diabetic neuropathy Stacey Flavin M, DO Taking Active   amoxicillin-clavulanate (AUGMENTIN) 875-125 MG tablet [id number] No Take 1 tablet by mouth 2 (two) times daily. Stacey Flavin M, DO Taking Active   aspirin EC 81 MG tablet [id number] No Take 1 tablet (81 mg total) by mouth daily. Stacey Neat., NP Taking Active            Med Note [facility], [name]   Mon Dec 28, 2021  8:29 AM) Taking 325  Blood Glucose Monitoring Suppl [facility] VERIO) w/Device KIT [id number] No Use to test blood sugar twice daily as directed; DX: E11.69 Stacey Ip, DO Taking Active Self  Continuous Glucose Sensor (FREESTYLE LIBRE 3 SENSOR) MISC [id number] No Place 1 sensor on the skin every 14 days. Use to check glucose continuously; DX:E11.65 Stacey Flavin M, DO Taking Active   diclofenac Sodium (VOLTAREN) 1 % GEL [id number] No Apply 2 g topically 4 (four) times daily. For pain Stacey Ip, DO Taking Active   furosemide (LASIX) 40 MG tablet [id number] No TAKE 1 TABLET BY MOUTH ONCE DAILY AS NEEDED FOR EDEMA OR FLUID Stacey Flavin M, DO Taking Active   gabapentin (NEURONTIN) 600 MG tablet [id number] No TAKE 1 & 1/2 (ONE & ONE-HALF) TABLETS BY MOUTH THREE TIMES  DAILY Stacey Flavin M, DO Taking Active   glimepiride (AMARYL) 2 MG tablet [id number] No Take 2 mg by mouth daily. [provider] Taking Active   glucose blood (ONETOUCH VERIO) test strip [id number] No Use to test blood sugar twice daily as directed; DX: E11.69 Stacey Ip, DO Taking Active Self  Insulin Glargine (BASAGLAR KWIKPEN) 100 UNIT/ML [id number] No Inject 60 Units into the skin at bedtime.  Patient taking differently: Inject 40 Units into the skin at bedtime.   Stacey Ip, DO Taking Active            Med Note Danella Maiers   Tue Mar 22, 2023   1:01 PM) Via [employer] patient assistance program--ESCRIBE TO [facility]  Lidocaine 3.5 % LOTN [id number] No Apply 1 application  topically as needed. Stacey Loh M, PA-C Taking Active   methocarbamol (ROBAXIN) 500 MG tablet [id number] No Take 1 tablet (500 mg total) by mouth every 6 (six) hours as needed for muscle spasms. Stacey Flavin M, DO Taking Active   metoprolol tartrate (LOPRESSOR) 25 MG tablet [id number] No Take 1 tablet (25 mg total) by mouth 2 (two) times daily. Stacey Flavin M, DO Taking Active   nitroGLYCERIN (NITROSTAT) 0.4 MG SL tablet [id number] No Place 1 tablet (0.4 mg total) under the tongue every 5 (five) minutes x 3 doses as needed for chest pain (if no relief after 3rd dose, proceed to ED or call 911). Stacey Dory, NP Taking Active   omeprazole (PRILOSEC) 40 MG capsule [id number] No Take 1 capsule (40 mg total) by mouth daily. For heartburn Stacey Flavin M, DO Taking Active   OneTouch Delica Lancets 30G MISC [id number] No Use to test blood sugar twice daily as directed; DX: E11.69 Stacey Ip, DO Taking Active Self  OneTouch Delica Lancets 33G MISC [id number] No Check blood sugar BID and prn  E11.9 Stacey Flavin M, DO Taking Active Self  rosuvastatin (CRESTOR) 20 MG tablet [id number] No Take 1 tablet (20 mg total) by mouth at bedtime. Stacey Flavin M, DO Taking Active   Semaglutide (RYBELSUS) 7 MG TABS [id number] No Take 14 mg by mouth daily. [provider] Taking Active            Med Note [name]   Tue Sep 22, 2021  3:08 PM) VIA [employer] PATIENT ASSISTANCE PROGRAM  sertraline (ZOLOFT) 50 MG tablet [id number] No Take 1 tablet (50 mg total) by mouth daily. Daily for depression/ anxiety  Patient not taking: Reported on 03/29/2023   Stacey Ip, DO Not Taking Active   telmisartan (MICARDIS) 40 MG tablet [id number] No Take 1 tablet (40 mg total) by mouth daily. Stacey Flavin M, DO Taking Active   Vitamin  D, Ergocalciferol, (DRISDOL) 1.25 MG (50000 UNIT) CAPS capsule [id number] No Take 1 capsule (50,000 Units total) by mouth every 7 (seven) days. Then take OTC Vit D 800IU daily  Patient not taking: Reported on 03/29/2023   Stacey Ip, DO Not Taking Active               Assessment/Plan:  New RX sent to [facility] for [place] 3 sensor Patient should qualify due to insulin status/HTA medicare able to fill for $0 at pharmacy Encouraged patient to call if [facility] is unable to fill   Follow Up Plan: 1-2 months   Kieth Brightly, PharmD, BCACP Clinical Pharmacist, [facility]

## 2023-04-08 DIAGNOSIS — Z794 Long term (current) use of insulin: Secondary | ICD-10-CM | POA: Diagnosis not present

## 2023-04-08 DIAGNOSIS — F17210 Nicotine dependence, cigarettes, uncomplicated: Secondary | ICD-10-CM

## 2023-04-08 DIAGNOSIS — I10 Essential (primary) hypertension: Secondary | ICD-10-CM

## 2023-04-08 DIAGNOSIS — E1159 Type 2 diabetes mellitus with other circulatory complications: Secondary | ICD-10-CM | POA: Diagnosis not present

## 2023-04-11 ENCOUNTER — Ambulatory Visit (HOSPITAL_COMMUNITY): Payer: PPO | Admitting: Physical Therapy

## 2023-04-13 ENCOUNTER — Ambulatory Visit: Payer: PPO | Admitting: Nurse Practitioner

## 2023-04-13 ENCOUNTER — Ambulatory Visit (HOSPITAL_COMMUNITY): Payer: PPO

## 2023-04-19 ENCOUNTER — Ambulatory Visit (HOSPITAL_COMMUNITY): Payer: PPO

## 2023-04-19 ENCOUNTER — Ambulatory Visit: Payer: Self-pay | Admitting: *Deleted

## 2023-04-19 ENCOUNTER — Telehealth: Payer: PPO

## 2023-04-20 ENCOUNTER — Encounter: Payer: Self-pay | Admitting: *Deleted
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
## 2023-04-21 ENCOUNTER — Ambulatory Visit (HOSPITAL_COMMUNITY): Payer: PPO

## 2023-04-21 ENCOUNTER — Encounter: Payer: Self-pay | Admitting: Nurse Practitioner

## 2023-04-21 ENCOUNTER — Ambulatory Visit: Payer: PPO | Attending: Nurse Practitioner | Admitting: Nurse Practitioner

## 2023-04-21 VITALS — BP 118/58 | HR 70 | Ht 62.0 in | Wt 288.6 lb

## 2023-04-21 DIAGNOSIS — I890 Lymphedema, not elsewhere classified: Secondary | ICD-10-CM

## 2023-04-21 DIAGNOSIS — Z7984 Long term (current) use of oral hypoglycemic drugs: Secondary | ICD-10-CM

## 2023-04-21 DIAGNOSIS — I25119 Atherosclerotic heart disease of native coronary artery with unspecified angina pectoris: Secondary | ICD-10-CM | POA: Diagnosis not present

## 2023-04-21 DIAGNOSIS — N183 Chronic kidney disease, stage 3 unspecified: Secondary | ICD-10-CM | POA: Diagnosis not present

## 2023-04-21 DIAGNOSIS — Z01810 Encounter for preprocedural cardiovascular examination: Secondary | ICD-10-CM

## 2023-04-21 DIAGNOSIS — I152 Hypertension secondary to endocrine disorders: Secondary | ICD-10-CM

## 2023-04-21 DIAGNOSIS — R0609 Other forms of dyspnea: Secondary | ICD-10-CM

## 2023-04-21 DIAGNOSIS — E785 Hyperlipidemia, unspecified: Secondary | ICD-10-CM | POA: Diagnosis not present

## 2023-04-21 DIAGNOSIS — G4733 Obstructive sleep apnea (adult) (pediatric): Secondary | ICD-10-CM | POA: Diagnosis not present

## 2023-04-21 DIAGNOSIS — E1159 Type 2 diabetes mellitus with other circulatory complications: Secondary | ICD-10-CM | POA: Diagnosis not present

## 2023-04-21 MED ORDER — METOPROLOL TARTRATE 25 MG PO TABS
12.5000 mg | ORAL_TABLET | Freq: Two times a day (BID) | ORAL | Status: DC
Start: 2023-04-21 — End: 2023-04-25

## 2023-04-21 MED ORDER — AMLODIPINE BESYLATE 2.5 MG PO TABS: 2.5000 mg | ORAL_TABLET | Freq: Every day | ORAL | 6 refills | Status: DC

## 2023-04-21 NOTE — Progress Notes (Signed)
Office Visit    Patient Name: [name] Date of Encounter: 04/21/2023  PCP:  [name], DO   [facility]  Cardiologist:  Nona Dell, MD  Advanced Practice Provider:  No care team member to display Electrophysiologist:  None   Chief Complaint    [name] is a 62 y.o. female with a hx of CAD, hx of mild MI, hyperlipidemia, hypertension, OSA on CPAP, type 2 diabetes, CKD stage III, GERD, hx of TIA's, and morbid obesity, who presents today for preoperative cardiovascular risk assessment.  Past Medical History    Past Medical History:  Diagnosis Date   Arthritis    CAD (coronary artery disease)    BMS to circumflex 2007 - Dr. Juanda Chance   CKD (chronic kidney disease) stage 3, GFR 30-59 ml/min (HCC)    Essential hypertension    Falls frequently    GERD (gastroesophageal reflux disease)    HA (headache)    Hyperlipidemia    Hyperlipidemia associated with type 2 diabetes mellitus (HCC) 02/03/2011   Left-sided face pain    Morbid obesity (HCC)    Noncompliance    OSA (obstructive sleep apnea)    Uses CPAP   Type 2 diabetes mellitus (HCC)    Urinary incontinence    Past Surgical History:  Procedure Laterality Date   ABDOMINAL HERNIA REPAIR     ABDOMINAL HYSTERECTOMY     BACK SURGERY     BREAST SURGERY     biopsy per right breast; pt states has silver piece in for marking    Cataract surgery Bilateral    CHOLECYSTECTOMY     COLONOSCOPY N/A 04/10/2013   Procedure: COLONOSCOPY;  Surgeon: Malissa Hippo, MD;  Location: [facility];  Service: Endoscopy;  Laterality: N/A;   CORONARY ANGIOPLASTY WITH STENT PLACEMENT  2012   LEFT HEART CATH AND CORONARY ANGIOGRAPHY N/A 06/19/2020   Procedure: LEFT HEART CATH AND CORONARY ANGIOGRAPHY;  Surgeon: Lyn Records, MD;  Location: [facility];  Service: Cardiovascular;  Laterality: N/A;   LUMBAR LAMINECTOMY/DECOMPRESSION MICRODISCECTOMY N/A 04/25/2015   Procedure: CENTRAL LUMBAR  /DECOMPRESSION L4-L5;  Surgeon: Ranee Gosselin, MD;  Location: [facility];  Service: Orthopedics;  Laterality: N/A;   TUBAL LIGATION      Allergies  Allergies  Allergen Reactions   Duloxetine    Farxiga [Dapagliflozin]     RECURRENT YEAST INFECTIONS   Morphine And Codeine Nausea And Vomiting    History of Present Illness    [name] is a 62 y.o. female with a PMH as mentioned above.  Last seen by Dr. Diona Browner in 2019.  At that time, she noted increasing chest discomfort episodes.  Cannot identify specific activity that brought on her symptoms.  Myoview was arranged and was considered low risk.  She noted chest pain in August 2021 of sudden onset at rest.  Occurred in left forearm radiated to left arm/shoulder and chest with associated shortness of breath and diaphoresis.  Mild improvement after taking nitroglycerin.  Admitted and underwent cardiac catheterization that revealed widely patent OM stent, occluded dominant RCA with diffuse luminal irregularities, no high-grade lesions identified.  Last seen by [name], NP on August 05, 2020 for post cardiac catheterization follow-up.  She still noted minor chest pain, attributed this to stress and anxiety, at the time her father was in hospice and was slowly dying.  Denied any recent anginal or exertional symptoms.  Stated her weight was recently increased.  At that time, was having issues with blood sugar control.   I last saw her for follow-up and pre-operative cardiovascular risk assessment on 03/21/2023. Pending left carpal tunnel release with Dr. Thane Edu. Date of surgery TBD. Admitted to DOE, mobility limited due to mobility issues and chronic back issues, denied any chest pain. Myoview and Echo arranged - reports noted below.  Today she presents for follow-up with her granddaughter.  Breathing is the same.  Does admit to occasional dull, mid chest pressure intermittent since 2021.  These episodes since 2021 have not been as bad  and "no big deal" per her report, says she has been ignoring it, typically happens at night and she lays down, takes an aspirin, and episode eventually goes away.  Last episode was a couple days ago, attributed this episode to stress. Denies any chest pain with ambulation. Denies any palpitations, syncope, presyncope, dizziness, orthopnea, PND, significant weight changes, acute bleeding, or claudication.   EKGs/Labs/Other Studies Reviewed:   The following studies were reviewed today:   EKG:  EKG is not ordered today.    Myoview 03/2023:   Findings are consistent with inferolateral infarction with mild peri-infarct ischemia. The study is low risk.   No ST deviation was noted.   LV perfusion is abnormal.   Left ventricular function is normal. Nuclear stress EF: 75 %. The left ventricular ejection fraction is hyperdynamic (>65%). End diastolic cavity size is normal.  Echocardiogram 03/2023: 1. Left ventricular ejection fraction, by estimation, is 55 to 60%. The  left ventricle has normal function. The left ventricle has no regional  wall motion abnormalities. Left ventricular diastolic parameters are  consistent with Grade I diastolic  dysfunction (impaired relaxation). The average left ventricular global  longitudinal strain is -23.6 %. The global longitudinal strain is normal.   2. Right ventricular systolic function is normal. The right ventricular  size is normal. There is normal pulmonary artery systolic pressure. The  estimated right ventricular systolic pressure is 12.4 mmHg.   3. The mitral valve is grossly normal. Trivial mitral valve  regurgitation.   4. The aortic valve is tricuspid. Aortic valve regurgitation is trivial.  No aortic stenosis is present. Aortic valve mean gradient measures 5.0  mmHg.   5. The inferior vena cava is normal in size with greater than 50%  respiratory variability, suggesting right atrial pressure of 3 mmHg.   Comparison(s): Prior images reviewed side  by side. LVEF normal range at  55-60%. Prominent epicardial fat pad.  LHC 06/2020:  2nd Mrg lesion is 30% stenosed.   Codominant right coronary with moderate diffuse atherosclerosis.  No high-grade obstructive disease. Widely patent left main. LAD gives origin to one large diagonal.  Distal to the bifurcation with the diagonal the LAD contains eccentric 40 to 50% narrowing within a region of calcification.  The ostium of the diagonal is 25% narrowed.  No high-grade focal stenosis is noted in the LAD or diagonal territory. Circumflex is codominant.  The obtuse marginal in the large second diagonal contains diffuse in-stent restenosis which narrows the lumen by 30%.  No high-grade obstruction is noted in the circumflex territory. Normal left ventricular function with upper normal LV EDP of 18 mmHg.  EF 60% or greater.   RECOMMENDATIONS:   Continued patency of the bare-metal stent in the obtuse marginal.  No high-grade obstructive disease that can be implicated as a source/cause of the patient's presenting symptoms.\ Further management and work-up as directed by the primary team.   Echo  06/2020:  1. Left ventricular ejection fraction, by estimation, is 50 to 55%. The  left ventricle has normal function. The left ventricle has no regional  wall motion abnormalities. There is mild left ventricular hypertrophy.  Left ventricular diastolic parameters  are consistent with Grade I diastolic dysfunction (impaired relaxation).  Elevated left atrial pressure.   2. Right ventricular systolic function is normal. The right ventricular  size is normal.   3. The mitral valve is normal in structure. Mild mitral valve  regurgitation. No evidence of mitral stenosis.   4. The aortic valve has an indeterminant number of cusps. Aortic valve  regurgitation is not visualized. No aortic stenosis is present.   5. The inferior vena cava is normal in size with greater than 50%  respiratory variability, suggesting  right atrial pressure of 3 mmHg.  Recent Labs: 03/21/2023: ALT 19; BUN 16; Creatinine, Ser 1.32; Hemoglobin 13.4; Platelets 102; Potassium 4.2; Sodium 145  Recent Lipid Panel    Component Value Date/Time   CHOL 151 08/09/2022 0956   TRIG 224 (H) 08/09/2022 0956   TRIG 211 (H) 12/30/2014 1537   HDL 38 (L) 08/09/2022 0956   HDL 48 12/30/2014 1537   CHOLHDL 4.0 08/09/2022 0956   CHOLHDL 4.5 04/09/2013 1135   VLDL 37 04/09/2013 1135   LDLCALC 76 08/09/2022 0956   LDLCALC 42 08/28/2014 1037   LDLDIRECT 52 01/12/2022 1452    Risk Assessment/Calculations:   The 10-year ASCVD risk score (Arnett DK, et al., 2019) is: 18.5%   Values used to calculate the score:     Age: 80 years     Sex: Female     Is Non-Hispanic African American: No     Diabetic: Yes     Tobacco smoker: Yes     Systolic Blood Pressure: 118 mmHg     Is BP treated: Yes     HDL Cholesterol: 38 mg/dL     Total Cholesterol: 151 mg/dL   Home Medications   Current Meds  Medication Sig   acetaminophen (TYLENOL) 325 MG tablet Take 975 mg by mouth every 8 (eight) hours as needed.   albuterol (VENTOLIN HFA) 108 (90 Base) MCG/ACT inhaler Inhale 2 puffs into the lungs every 4 (four) hours as needed for wheezing or shortness of breath (for rescue).   Alpha-Lipoic Acid 600 MG CAPS Take 1 capsule (600 mg total) by mouth daily. For diabetic neuropathy   aspirin EC 325 MG tablet Take 325 mg by mouth daily.   Blood Glucose Monitoring Suppl (ONETOUCH VERIO) w/Device KIT Use to test blood sugar twice daily as directed; DX: E11.69   Continuous Glucose Sensor (FREESTYLE LIBRE 3 SENSOR) MISC Place 1 sensor on the skin every 14 days. Use to check glucose continuously; DX:E11.65   furosemide (LASIX) 40 MG tablet TAKE 1 TABLET BY MOUTH ONCE DAILY AS NEEDED FOR EDEMA OR FLUID   gabapentin (NEURONTIN) 600 MG tablet TAKE 1 & 1/2 (ONE & ONE-HALF) TABLETS BY MOUTH THREE TIMES DAILY   glimepiride (AMARYL) 2 MG tablet Take 2 mg by mouth daily.    glucose blood (ONETOUCH VERIO) test strip Use to test blood sugar twice daily as directed; DX: E11.69   Insulin Glargine (BASAGLAR KWIKPEN) 100 UNIT/ML Inject 60 Units into the skin at bedtime. (Patient taking differently: Inject 40 Units into the skin at bedtime.)   methocarbamol (ROBAXIN) 500 MG tablet Take 1 tablet (500 mg total) by mouth every 6 (six) hours as needed for muscle spasms.   nitroGLYCERIN (NITROSTAT)  0.4 MG SL tablet Place 1 tablet (0.4 mg total) under the tongue every 5 (five) minutes x 3 doses as needed for chest pain (if no relief after 3rd dose, proceed to ED or call 911).   omeprazole (PRILOSEC) 40 MG capsule Take 1 capsule (40 mg total) by mouth daily. For heartburn   rosuvastatin (CRESTOR) 20 MG tablet Take 1 tablet (20 mg total) by mouth at bedtime.   telmisartan (MICARDIS) 40 MG tablet Take 1 tablet (40 mg total) by mouth daily.   Vitamin D, Ergocalciferol, (DRISDOL) 1.25 MG (50000 UNIT) CAPS capsule Take 1 capsule (50,000 Units total) by mouth every 7 (seven) days. Then take OTC Vit D 800IU daily   metoprolol tartrate (LOPRESSOR) 25 MG tablet Take 1 tablet (25 mg total) by mouth 2 (two) times daily.    Review of Systems    All other systems reviewed and are otherwise negative except as noted above.  Physical Exam    VS:  BP (!) 118/58   Pulse 70   Ht 5\' 2"  (1.575 m)   Wt 288 lb 9.6 oz (130.9 kg)   SpO2 98%   BMI 52.79 kg/m  , BMI Body mass index is 52.79 kg/m.  Wt Readings from Last 3 Encounters:  04/21/23 288 lb 9.6 oz (130.9 kg)  03/21/23 288 lb (130.6 kg)  03/21/23 287 lb (130.2 kg)     GEN: Morbidly obese, 62 y.o. female in no acute distress. HEENT: normal. Neck: Supple, no JVD, carotid bruits, or masses. Cardiac: S1/S2, RRR, no murmurs, rubs, or gallops. No clubbing, cyanosis. Lymphedema noted along BLE.  Radials2+/PT 1+ and equal bilaterally.  Respiratory:  Respirations regular and unlabored, clear to auscultation bilaterally. MS: No deformity  or atrophy. Skin: Warm and dry, no rash.  Neuro:  Strength and sensation are intact. Psych: Normal affect.  Assessment & Plan    Pre-operative cardiovascular risk assessment Ms. Marsolek's perioperative risk of a major cardiac event is 0.9% according to the Revised Cardiac Risk Index (RCRI).  Therefore, she is at low risk for perioperative complications.   Her functional capacity is poor at 3.3 METs according to the Duke Activity Status Index (DASI). Recommendations: Pt notes occasional chest pressure and medications have been adjusted with this visit - see below. If at next telephone visit, she denies any symptoms, she can proceed to surgery at acceptable risk.  Antiplatelet and/or Anticoagulation Recommendations: If no symptoms of chest pressure, okay to hold Aspirin 7 days prior to procedure and resume when it is felt safe to do so post-op. Will then route note to requesting party.   CAD, DOE Admits to chronic, intermittent chest pressure (see HPI), also admits to stable DOE. LHC in 2019 was negative for any high grade lesions. Myoview low risk. Continue Aspirin, telmisartan, rosuvastatin, and NTG, will reduce Lopressor to 12.5 mg BID and start Amlodipine at 2.5 mg daily for antianginal effect. Heart healthy diet encouraged. ED precautions discussed.   HLD LDL 76 08/2022. Continue current medications. Heart healthy diet encouraged.   HTN BP stable.  Reducing Lopressor to 12.5 mg twice daily, starting amlodipine at 2.5 mg daily.  No other medication changes at this time.  Discussed to monitor BP at home at least 2 hours after medications and sitting for 5-10 minutes. Heart healthy diet encouraged.   OSA on CPAP Encouraged continued compliance.   CKD stage 3 Most recent sCr was 1.32 with eGFR 45, stable. Avoid nephrotoxic agents. Continue to follow with PCP.  7. Lymphedema  Chronic, stable. Appears previous referral I made to lymphedema clinic has been closed. Discussed with pt about  resending referral, however pt declines at this time, does report owning compression devices that granddaughter will help her apply at home. Continue to follow with PCP.  Disposition: Follow up in 1-2 week(s) with Nona Dell, MD or APP via telephone visit.  Signed, Sharlene Dory, NP 04/21/2023, 9:29 AM [facility]

## 2023-04-21 NOTE — Patient Instructions (Signed)
Medication Instructions:   Begin Amlodipine 2.5mg  daily Decrease Metoprolol to 12.5mg  twice a day   Continue all other medications.     Labwork:  none  Testing/Procedures:  none  Follow-Up:  Phone visit for 05/03/2023  Any Other Special Instructions Will Be Listed Below (If Applicable).   If you need a refill on your cardiac medications before your next appointment, please call your pharmacy.

## 2023-04-25 ENCOUNTER — Telehealth: Payer: Self-pay | Admitting: *Deleted

## 2023-04-25 DIAGNOSIS — I152 Hypertension secondary to endocrine disorders: Secondary | ICD-10-CM

## 2023-04-25 MED ORDER — METOPROLOL TARTRATE 25 MG PO TABS
12.5000 mg | ORAL_TABLET | Freq: Two times a day (BID) | ORAL | 1 refills | Status: DC
Start: 2023-04-25 — End: 2023-05-03

## 2023-04-25 NOTE — Telephone Encounter (Signed)
Lesle Chris, LPN 1/61/0960  4:54 PM EDT Back to Top    Notified, copy to pcp.

## 2023-04-25 NOTE — Telephone Encounter (Signed)
-----   Message from Sharlene Dory, NP sent at 04/09/2023  8:00 AM EDT ----- Findings showed evidence of previous MI with mild area of poor blood flow, study determined to be low risk. Normal pumping function.  I will discuss this result with her at upcoming OV with me later this month.   Sharlene Dory, AGNP-C

## 2023-04-26 ENCOUNTER — Telehealth: Payer: PPO

## 2023-04-26 ENCOUNTER — Ambulatory Visit (HOSPITAL_COMMUNITY): Payer: PPO

## 2023-04-28 ENCOUNTER — Telehealth: Payer: Self-pay | Admitting: Family Medicine

## 2023-04-28 ENCOUNTER — Other Ambulatory Visit: Payer: Self-pay | Admitting: Family Medicine

## 2023-04-28 ENCOUNTER — Ambulatory Visit (HOSPITAL_COMMUNITY): Payer: PPO

## 2023-04-28 DIAGNOSIS — E559 Vitamin D deficiency, unspecified: Secondary | ICD-10-CM

## 2023-04-28 DIAGNOSIS — J42 Unspecified chronic bronchitis: Secondary | ICD-10-CM

## 2023-04-28 NOTE — Telephone Encounter (Signed)
Calling to report patient has been in bed all day with chills. Was advised to call us or go to urgent care.

## 2023-05-02 ENCOUNTER — Ambulatory Visit (HOSPITAL_COMMUNITY): Payer: PPO

## 2023-05-03 ENCOUNTER — Encounter: Payer: Self-pay | Admitting: *Deleted

## 2023-05-03 ENCOUNTER — Ambulatory Visit: Payer: Self-pay | Admitting: *Deleted

## 2023-05-03 ENCOUNTER — Ambulatory Visit: Payer: PPO | Attending: Nurse Practitioner | Admitting: Nurse Practitioner

## 2023-05-03 VITALS — BP 103/49 | HR 69 | Ht 62.0 in | Wt 288.0 lb

## 2023-05-03 DIAGNOSIS — Z01810 Encounter for preprocedural cardiovascular examination: Secondary | ICD-10-CM

## 2023-05-03 DIAGNOSIS — I10 Essential (primary) hypertension: Secondary | ICD-10-CM

## 2023-05-03 DIAGNOSIS — N183 Chronic kidney disease, stage 3 unspecified: Secondary | ICD-10-CM | POA: Diagnosis not present

## 2023-05-03 DIAGNOSIS — G4733 Obstructive sleep apnea (adult) (pediatric): Secondary | ICD-10-CM

## 2023-05-03 DIAGNOSIS — I890 Lymphedema, not elsewhere classified: Secondary | ICD-10-CM

## 2023-05-03 DIAGNOSIS — E1159 Type 2 diabetes mellitus with other circulatory complications: Secondary | ICD-10-CM

## 2023-05-03 DIAGNOSIS — I25119 Atherosclerotic heart disease of native coronary artery with unspecified angina pectoris: Secondary | ICD-10-CM | POA: Diagnosis not present

## 2023-05-03 DIAGNOSIS — E785 Hyperlipidemia, unspecified: Secondary | ICD-10-CM | POA: Diagnosis not present

## 2023-05-03 MED ORDER — METOPROLOL TARTRATE 25 MG PO TABS
25.0000 mg | ORAL_TABLET | Freq: Two times a day (BID) | ORAL | 2 refills | Status: DC
Start: 2023-05-03 — End: 2024-05-18

## 2023-05-03 NOTE — Progress Notes (Unsigned)
Virtual Visit via Telephone Note   Because of [name]'s co-morbid illnesses, she is at least at moderate risk for complications without adequate follow up.  This format is felt to be most appropriate for this patient at this time.  The patient did not have access to video technology/had technical difficulties with video requiring transitioning to audio format only (telephone).  All issues noted in this document were discussed and addressed.  No physical exam could be performed with this format.  Please refer to the patient's chart for her consent to telehealth for [facility].    Date:  05/03/2023  ID:  [name], DOB 1960-12-04, MRN [id number] The patient was identified using 2 identifiers.  Patient Location: Home Provider Location: Office/Clinic   PCP:  [name], DO    [facility] Providers Cardiologist:  Nona Dell, MD     Evaluation Performed:  Follow-Up Visit  Chief Complaint:  Chest pain follow-up  History of Present Illness:    [name] is a 62 y.o. female with a PMH of CAD, hx of mild MI, hyperlipidemia, hypertension, OSA on CPAP, type 2 diabetes, CKD stage III, GERD, hx of TIA's, and morbid obesity, who presents today for preoperative cardiovascular risk assessment for pending left carpal tunnel release with Dr. Thane Edu. Date of surgery TBD.   Underwent LHC August 2021, revealed widely patent OM stent, occluded dominant RCA with diffuse luminal irregularities, no high-grade lesions identified.  Underwent recent Myoview and Echo 03/2023 - reports noted below.  Today she presents for follow-up via telephone visit.  Continues to admit occasional dull, mid chest pressure intermittent since 2021. Episodes since 2021 not bothersome per her report.Denies any exertional angina, remains the same despite medical management from last OV. BP this AM 103/49, HR 69 bpm. Denies any shortness of breath, palpitations, syncope, presyncope,  dizziness, orthopnea, PND, significant weight changes, acute bleeding, or claudication.   Past Medical History:  Diagnosis Date   Arthritis    CAD (coronary artery disease)    BMS to circumflex 2007 - Dr. Juanda Chance   CKD (chronic kidney disease) stage 3, GFR 30-59 ml/min (HCC)    Essential hypertension    Falls frequently    GERD (gastroesophageal reflux disease)    HA (headache)    Hyperlipidemia    Hyperlipidemia associated with type 2 diabetes mellitus (HCC) 02/03/2011   Left-sided face pain    Morbid obesity (HCC)    Noncompliance    OSA (obstructive sleep apnea)    Uses CPAP   Type 2 diabetes mellitus (HCC)    Urinary incontinence    Past Surgical History:  Procedure Laterality Date   ABDOMINAL HERNIA REPAIR     ABDOMINAL HYSTERECTOMY     BACK SURGERY     BREAST SURGERY     biopsy per right breast; pt states has silver piece in for marking    Cataract surgery Bilateral    CHOLECYSTECTOMY     COLONOSCOPY N/A 04/10/2013   Procedure: COLONOSCOPY;  Surgeon: Malissa Hippo, MD;  Location: [facility];  Service: Endoscopy;  Laterality: N/A;   CORONARY ANGIOPLASTY WITH STENT PLACEMENT  2012   LEFT HEART CATH AND CORONARY ANGIOGRAPHY N/A 06/19/2020   Procedure: LEFT HEART CATH AND CORONARY ANGIOGRAPHY;  Surgeon: Lyn Records, MD;  Location: [facility] INVASIVE CV LAB;  Service: Cardiovascular;  Laterality: N/A;   LUMBAR LAMINECTOMY/DECOMPRESSION MICRODISCECTOMY N/A 04/25/2015   Procedure: CENTRAL LUMBAR /DECOMPRESSION L4-L5;  Surgeon: Ranee Gosselin, MD;  Location: [facility] ORS;  Service: Orthopedics;  Laterality: N/A;   TUBAL LIGATION       Current Meds  Medication Sig   acetaminophen (TYLENOL) 325 MG tablet Take 975 mg by mouth every 8 (eight) hours as needed.   albuterol (VENTOLIN HFA) 108 (90 Base) MCG/ACT inhaler INHALE 2 PUFFS BY MOUTH EVERY 4 HOURS AS NEEDED FOR WHEEZING OR SHORTNESS OF BREATH (FOR  RESCUE)   Alpha-Lipoic Acid 600 MG CAPS Take 1 capsule (600 mg total) by mouth daily.  For diabetic neuropathy   aspirin EC 325 MG tablet Take 325 mg by mouth daily.   Blood Glucose Monitoring Suppl (ONETOUCH VERIO) w/Device KIT Use to test blood sugar twice daily as directed; DX: E11.69   Continuous Glucose Sensor (FREESTYLE LIBRE 3 SENSOR) MISC Place 1 sensor on the skin every 14 days. Use to check glucose continuously; DX:E11.65   furosemide (LASIX) 40 MG tablet TAKE 1 TABLET BY MOUTH ONCE DAILY AS NEEDED FOR EDEMA OR FLUID   gabapentin (NEURONTIN) 600 MG tablet TAKE 1 & 1/2 (ONE & ONE-HALF) TABLETS BY MOUTH THREE TIMES DAILY   glimepiride (AMARYL) 2 MG tablet Take 2 mg by mouth daily.   glucose blood (ONETOUCH VERIO) test strip Use to test blood sugar twice daily as directed; DX: E11.69   Insulin Glargine (BASAGLAR KWIKPEN) 100 UNIT/ML Inject 60 Units into the skin at bedtime. (Patient taking differently: Inject 40 Units into the skin at bedtime.)   methocarbamol (ROBAXIN) 500 MG tablet Take 1 tablet (500 mg total) by mouth every 6 (six) hours as needed for muscle spasms.   nitroGLYCERIN (NITROSTAT) 0.4 MG SL tablet Place 1 tablet (0.4 mg total) under the tongue every 5 (five) minutes x 3 doses as needed for chest pain (if no relief after 3rd dose, proceed to ED or call 911).   omeprazole (PRILOSEC) 40 MG capsule Take 1 capsule (40 mg total) by mouth daily. For heartburn   OneTouch Delica Lancets 30G MISC Use to test blood sugar twice daily as directed; DX: E11.69   OneTouch Delica Lancets 33G MISC Check blood sugar BID and prn  E11.9   rosuvastatin (CRESTOR) 20 MG tablet Take 1 tablet (20 mg total) by mouth at bedtime.   Semaglutide (RYBELSUS) 7 MG TABS Take 14 mg by mouth daily.   sertraline (ZOLOFT) 50 MG tablet Take 1 tablet (50 mg total) by mouth daily. Daily for depression/ anxiety   telmisartan (MICARDIS) 40 MG tablet Take 1 tablet (40 mg total) by mouth daily.   Vitamin D, Ergocalciferol, (DRISDOL) 1.25 MG (50000 UNIT) CAPS capsule TAKE 1 CAPSULE BY MOUTH ONCE A WEEK,  THEN TAKE OTC VITAMIN D 800IU DAILY   amLODipine (NORVASC) 2.5 MG tablet Take 1 tablet (2.5 mg total) by mouth daily.   metoprolol tartrate (LOPRESSOR) 25 MG tablet Take 0.5 tablets (12.5 mg total) by mouth 2 (two) times daily.     Allergies:   Duloxetine, Farxiga [dapagliflozin], and Morphine and codeine   Social History   Tobacco Use   Smoking status: Every Day    Packs/day: 1.00    Years: 47.00    Additional pack years: 0.00    Total pack years: 47.00    Types: Cigarettes    Start date: 05/14/1972    Passive exposure: Current   Smokeless tobacco: Never   Tobacco comments:    Smoking Cessation Classes, Services, Agencies & Resources Offered.  Vaping Use   Vaping Use: Never used  Substance Use Topics   Alcohol  use: No    Alcohol/week: 0.0 standard drinks of alcohol   Drug use: No     Family Hx: The patient's family history includes Arthritis in her sister; Asthma in her sister; Breast cancer in her mother; Cancer in her father; Cancer - Colon in her father; Congenital heart disease in her son; Coronary artery disease in her father; Diabetes in her father; Hiatal hernia in her son; High Cholesterol in her daughter and father; Thyroid disease in her daughter.  ROS:   Please see the history of present illness.    All other systems reviewed and are negative.   Prior CV studies:   The following studies were reviewed today:  Myoview 03/2023:   Findings are consistent with inferolateral infarction with mild peri-infarct ischemia. The study is low risk.   No ST deviation was noted.   LV perfusion is abnormal.   Left ventricular function is normal. Nuclear stress EF: 75 %. The left ventricular ejection fraction is hyperdynamic (>65%). End diastolic cavity size is normal.  Echocardiogram 03/2023: 1. Left ventricular ejection fraction, by estimation, is 55 to 60%. The  left ventricle has normal function. The left ventricle has no regional  wall motion abnormalities. Left  ventricular diastolic parameters are  consistent with Grade I diastolic  dysfunction (impaired relaxation). The average left ventricular global  longitudinal strain is -23.6 %. The global longitudinal strain is normal.   2. Right ventricular systolic function is normal. The right ventricular  size is normal. There is normal pulmonary artery systolic pressure. The  estimated right ventricular systolic pressure is 12.4 mmHg.   3. The mitral valve is grossly normal. Trivial mitral valve  regurgitation.   4. The aortic valve is tricuspid. Aortic valve regurgitation is trivial.  No aortic stenosis is present. Aortic valve mean gradient measures 5.0  mmHg.   5. The inferior vena cava is normal in size with greater than 50%  respiratory variability, suggesting right atrial pressure of 3 mmHg.   Comparison(s): Prior images reviewed side by side. LVEF normal range at  55-60%. Prominent epicardial fat pad.  LHC 06/2020:  2nd Mrg lesion is 30% stenosed.   Codominant right coronary with moderate diffuse atherosclerosis.  No high-grade obstructive disease. Widely patent left main. LAD gives origin to one large diagonal.  Distal to the bifurcation with the diagonal the LAD contains eccentric 40 to 50% narrowing within a region of calcification.  The ostium of the diagonal is 25% narrowed.  No high-grade focal stenosis is noted in the LAD or diagonal territory. Circumflex is codominant.  The obtuse marginal in the large second diagonal contains diffuse in-stent restenosis which narrows the lumen by 30%.  No high-grade obstruction is noted in the circumflex territory. Normal left ventricular function with upper normal LV EDP of 18 mmHg.  EF 60% or greater.   RECOMMENDATIONS:   Continued patency of the bare-metal stent in the obtuse marginal.  No high-grade obstructive disease that can be implicated as a source/cause of the patient's presenting symptoms.\ Further management and work-up as directed by the  primary team.    Labs/Other Tests and Data Reviewed:     Recent Labs: 03/21/2023: ALT 19; BUN 16; Creatinine, Ser 1.32; Hemoglobin 13.4; Platelets 102; Potassium 4.2; Sodium 145   Recent Lipid Panel Lab Results  Component Value Date/Time   CHOL 151 08/09/2022 09:56 AM   TRIG 224 (H) 08/09/2022 09:56 AM   TRIG 211 (H) 12/30/2014 03:37 PM   HDL 38 (L) 08/09/2022 09:56 AM  HDL 48 12/30/2014 03:37 PM   CHOLHDL 4.0 08/09/2022 09:56 AM   CHOLHDL 4.5 04/09/2013 11:35 AM   LDLCALC 76 08/09/2022 09:56 AM   LDLCALC 42 08/28/2014 10:37 AM   LDLDIRECT 52 01/12/2022 02:52 PM    Wt Readings from Last 3 Encounters:  05/03/23 288 lb (130.6 kg)  04/21/23 288 lb 9.6 oz (130.9 kg)  03/21/23 288 lb (130.6 kg)        Objective:    Vital Signs:  BP (!) 103/49   Pulse 69   Ht 5\' 2"  (1.575 m)   Wt 288 lb (130.6 kg)   BMI 52.68 kg/m    Due to the nature of today's visit, a PE was unable to be performed.   ASSESSMENT & PLAN:    Pre-operative cardiovascular risk assessment Ms. Newmark's perioperative risk of a major cardiac event is 0.9% according to the Revised Cardiac Risk Index (RCRI).  Therefore, she is at low risk for perioperative complications.   Her functional capacity is poor at 3.3 METs according to the Duke Activity Status Index (DASI). Activity is limited d/t mobility issues and chronic back issues. Recommendations: According to ACC/AHA guidelines, no further cardiovascular testing needed.  The patient may proceed to surgery at acceptable risk.   Antiplatelet and/or Anticoagulation Recommendations: If GI surgeon feels as though patient is a high bleeding risk, okay to hold Aspirin 7 days prior to procedure and resume when felt safe to do so. Will route note to requesting party.  2. CAD Etiology atypical for cardiac etiology. Admits to chronic, intermittent chest pressure (see HPI), also admits to stable DOE. LHC in 2019 was negative for any high grade lesions. Myoview low risk.  Continue Aspirin, telmisartan, rosuvastatin, and NTG, will increase Lopressor to 25 mg BID and stop Amlodipine at 2.5 mg daily as this is not helping relieve CP. Heart healthy diet encouraged. ED precautions discussed.   HLD LDL 76 08/2022. Continue current medications. Heart healthy diet encouraged.   HTN BP soft.  Stopping amlodipine and increasing Lopressor to 25 mg twice daily.  No other medication changes at this time.  Discussed to monitor BP at home at least 2 hours after medications and sitting for 5-10 minutes. Heart healthy diet encouraged.   4. OSA on CPAP Encouraged continued compliance.   5. CKD stage 3 Most recent sCr was 1.32 with eGFR 45, stable. Avoid nephrotoxic agents. Continue to follow with PCP.  6. Lymphedema Chronic, stable per her report. Previously discussed with pt about resending referral, however pt declines at this time, reported owning compression devices that granddaughter will help her apply at home. Continue to follow with PCP.  Time:   Today, I have spent 7 minutes with the patient with telehealth technology discussing the above problems.     Medication Adjustments/Labs and Tests Ordered: Current medicines are reviewed at length with the patient today.  Concerns regarding medicines are outlined above.   Tests Ordered: No orders of the defined types were placed in this encounter.   Medication Changes: Meds ordered this encounter  Medications   metoprolol tartrate (LOPRESSOR) 25 MG tablet    Sig: Take 1 tablet (25 mg total) by mouth 2 (two) times daily.    Dispense:  180 tablet    Refill:  2    Dose decreased 04/21/2023, place on file.  She will call after finish current supply.    Follow Up:  In Person in 6 month(s)  Signed, Sharlene Dory, NP  05/04/2023 3:29 PM  [place]

## 2023-05-03 NOTE — Patient Instructions (Addendum)
Medication Instructions:  Your physician has recommended you make the following change in your medication:  Stop Amlodipine  Start metoprolol Tartrate 25 Mg 2 times daily  Continue all other medications as prescribed   Labwork: none  Testing/Procedures: none  Follow-Up: Your physician recommends that you schedule a follow-up appointment in: 6 months with Dr.McDowell.  Any Other Special Instructions Will Be Listed Below (If Applicable).  If you need a refill on your cardiac medications before your next appointment, please call your pharmacy.

## 2023-05-03 NOTE — Patient Outreach (Signed)
Care Coordination   Follow Up Visit Note   05/03/2023  Name: [name] MRN: [id number] DOB: January 29, 1961  [name] is a 62 y.o. year old female who sees [name], DO for primary care. I spoke with [name] by phone today.  What matters to the patients health and wellness today? 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 (pt-stated)   On track     Care Coordination Interventions:  Interventions Today    Flowsheet Row Most Recent Value  Chronic Disease   Chronic disease during today's visit Hypertension (HTN), Diabetes, Other  [Tobacco Abuse, Morbid Obesity & Generalized Anxiety Disorder]  General Interventions   General Interventions Discussed/Reviewed General Interventions Discussed, Labs, Vaccines, Doctor Visits, Health Screening, Annual Foot Exam, General Interventions Reviewed, Annual Eye Exam, Durable Medical Equipment (DME), Community Resources, Level of Care  [Encouraged]  Labs Hgb A1c every 3 months, Kidney Function  [Encouraged]  Vaccines COVID-19, Flu, Pneumonia, RSV, Shingles, Tetanus/Pertussis/Diphtheria  [Encouraged]  Doctor Visits Discussed/Reviewed Doctor Visits Discussed, Doctor Visits Reviewed, Annual Wellness Visits, PCP, Specialist  [Encouraged]  Health Screening Bone Density, Colonoscopy, Mammogram  [Encouraged]  Durable Medical Equipment (DME) BP Cuff, Glucomoter, Other  [Encouraged]  PCP/Specialist Visits Compliance with follow-up visit  [Encouraged]  Level of Care Applications, Assisted Living, Personal Care Services  [Encouraged]  Applications Medicaid, Personal Care Services  [Encouraged]  Exercise Interventions   Exercise Discussed/Reviewed Assistive device use and maintanence, Exercise Discussed, Weight Managment, Physical Activity, Exercise Reviewed  [Encouraged]  Physical  Activity Discussed/Reviewed Physical Activity Discussed, Home Exercise Program (HEP), Physical Activity Reviewed, Types of exercise  [Encouraged]  Weight Management Weight loss  [Encouraged]  Education Interventions   Education Provided Provided Therapist, sports, Provided Web-based Education, Provided Education  [Encouraged]  Provided Verbal Education On Development worker, community, [employer], Medication, Exercise, Blood Sugar Monitoring, Applications, Eye Care, Foot Care, Nutrition, Mental Health/Coping with Illness, When to see the doctor  [Encouraged]  Applications Medicaid, Personal Care Services  [Encouraged]  Mental Health Interventions   Mental Health Discussed/Reviewed Other, Mental Health Discussed, Anxiety, Depression, Grief and Loss, Mental Health Reviewed, Coping Strategies, Substance Abuse, Suicide, Crisis  [Domestic Violence]  Nutrition Interventions   Nutrition Discussed/Reviewed Nutrition Discussed, Adding fruits and vegetables, Increasing proteins, Decreasing fats, Decreasing salt, Decreasing sugar intake, Carbohydrate meal planning, Portion sizes, Fluid intake, Nutrition Reviewed  [Encouraged]  Pharmacy Interventions   Pharmacy Dicussed/Reviewed Pharmacy Topics Discussed, Pharmacy Topics Reviewed, Medication Adherence, Affording Medications  [Encouraged]  Safety Interventions   Safety Discussed/Reviewed Safety Discussed, Safety Reviewed, Fall Risk  [Encouraged]  Advanced Directive Interventions   Advanced Directives Discussed/Reviewed Advanced Directives Discussed  [Completed]     Active Listening & Reflection Utilized.  Verbalization of Feelings Encouraged.  Emotional Support Provided. Feelings of Hopefulness Validated. Caregiver Resources Reviewed. Problem Solving Interventions Implemented. Task-Centered Solutions Activated.   Solution-Focused Strategies Employed. Acceptance & Commitment Therapy Performed. Cognitive Behavioral Therapy Initiated. Client-Centered Therapy  Indicated. Implementation of Deep Breathing Exercises, Relaxation Techniques, & Mindfulness Meditation Strategies Encouraged Daily. Confirmed Receipt & Thoroughly Reviewed List of [employer], Services, & Resources in [place], to [employer] & Entertain Questions. Encouraged Animal nutritionist, Services, & Resources of Interest in [place], in An Effort to [employer]. Confirmed Receipt & Thoroughly Reviewed List  of Food [employer], [employer], & Soup Kitchens in [place], to [employer] & Entertain Questions. Encouraged Careers information officer with [employer], Food Pantries, & Soup Kitchens in [place], in An Effort to [employer] & Nutritional Assistance. Encouraged Careers information officer with CSW (# [phone]), if You Have Questions, Need Assistance, or If Additional Social Work Needs Are Identified Between Now & Our Next Scheduled Follow-Up Outreach Call.      SDOH assessments and interventions completed:  Yes.  Care Coordination Interventions:  Yes, provided.   Follow up plan: Follow up call scheduled for 05/17/2023 at 11:45 am.  Encounter Outcome:  Pt. Visit Completed.   [name], BSW, MSW, LCSW  Licensed Restaurant manager, [facility]  [place]ailing [place] [address] [place], [place] [address] Physical Address-[address] [place], [place] [address] Toll Free Main # [phone] Fax # [phone] Cell # [phone] [name]
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
## 2023-05-04 ENCOUNTER — Ambulatory Visit (HOSPITAL_COMMUNITY): Payer: PPO | Admitting: Physical Therapy

## 2023-05-06 ENCOUNTER — Ambulatory Visit (INDEPENDENT_AMBULATORY_CARE_PROVIDER_SITE_OTHER): Payer: PPO | Admitting: Pharmacist

## 2023-05-06 ENCOUNTER — Other Ambulatory Visit: Payer: Self-pay | Admitting: Pharmacist

## 2023-05-06 DIAGNOSIS — Z794 Long term (current) use of insulin: Secondary | ICD-10-CM

## 2023-05-06 DIAGNOSIS — E785 Hyperlipidemia, unspecified: Secondary | ICD-10-CM

## 2023-05-06 DIAGNOSIS — E1165 Type 2 diabetes mellitus with hyperglycemia: Secondary | ICD-10-CM

## 2023-05-06 DIAGNOSIS — E1142 Type 2 diabetes mellitus with diabetic polyneuropathy: Secondary | ICD-10-CM

## 2023-05-06 MED ORDER — BASAGLAR KWIKPEN 100 UNIT/ML ~~LOC~~ SOPN
40.0000 [IU] | PEN_INJECTOR | Freq: Every day | SUBCUTANEOUS | 5 refills | Status: DC
Start: 2023-05-06 — End: 2023-09-08

## 2023-05-06 NOTE — Progress Notes (Signed)
05/06/2023 Name: [name] MRN: [id number] DOB: Feb 23, 1961  Chief Complaint  Patient presents with   Diabetes    [name] is a 62 y.o. year old female who presented for a telephone visit.   They were referred to the pharmacist by their PCP for assistance in managing diabetes and medication access. Discussed meds and labs with patient.  She continues to do well on Rybelsus 14mg  daily & insulin.  No reported side effects.  Patient was able to get the Rippey 3 CGM systems.     Subjective:  Care Team: Primary Care Provider: [name], DO ; Next Scheduled Visit: tbd   Medication Access/Adherence  Current Pharmacy:  [facility] [address], [place] - [address] [place] [address] Phone: [phone] Fax: [phone]  [facility] - [place], [place] - [address] [facility] [address] [place] [address] [place]  [address] Phone: [phone] Fax: [phone]  [facility] - [place], [place] - [address] [place] [address] Phone: [phone] Fax: [phone]   Patient reports affordability concerns with their medications: Yes  Patient reports access/transportation concerns to their pharmacy: No  Patient reports adherence concerns with their medications:  No     Diabetes:  Current medications: Basaglar, Rybelsus 14mg , still on glimepiride? D/c Medications tried in the past: metformin, dapagliflozin, sitagliptin  Current glucose readings: FBG<135, PPBG<180, mostly (can peak to 200 with sweets) Using libre 3 cgm (phone as reader) -She greatly benefits from her libre 3 CGM and is now at goal   Patient denies hypoglycemic s/sx including dizziness, shakiness, sweating. Patient denies hyperglycemic symptoms including polyuria, polydipsia, polyphagia, nocturia, neuropathy, blurred vision.  Current physical activity: difficult to get around;  encouraged as able  Current medication access support:  -BASAGLAR via [employer]; meds ship to pt home; ESCRIBE TO [facility]; approved UNTIL 11/08/23 -RYBELSUS via [employer] patient assistance program; meds ship to PCP office; please send message to [employer], CPhT or Vanice Sarah, PharmD if refills/adjustments are required ALL shipments (new, refill, dose changes can take up to 4-6 weeks)    Objective:  Lab Results  Component Value Date   HGBA1C 6.9 (H) 03/21/2023    Lab Results  Component Value Date   CREATININE 1.32 (H) 03/21/2023   BUN 16 03/21/2023   NA 145 (H) 03/21/2023   K 4.2 03/21/2023   CL 106 03/21/2023   CO2 20 03/21/2023    Lab Results  Component Value Date   CHOL 151 08/09/2022   HDL 38 (L) 08/09/2022   LDLCALC 76 08/09/2022   LDLDIRECT 52 01/12/2022   TRIG 224 (H) 08/09/2022   CHOLHDL 4.0 08/09/2022    Medications Reviewed Today     Reviewed by Danella Maiers, [facility] (Pharmacist) on 05/06/23 at 0727  Med List Status: <None>   Medication Order Taking? Sig Documenting Provider Last Dose Status Informant  acetaminophen (TYLENOL) 325 MG tablet [id number] No Take 975 mg by mouth every 8 (eight) hours as needed. [provider] Taking Active   albuterol (VENTOLIN HFA) 108 (90 Base) MCG/ACT inhaler [id number] No INHALE 2 PUFFS BY MOUTH EVERY 4 HOURS AS NEEDED FOR WHEEZING OR SHORTNESS OF BREATH (FOR  RESCUE) [name], DO Taking Active   Alpha-Lipoic Acid 600 MG CAPS [id number] No Take 1 capsule (600 mg total) by mouth daily. For diabetic neuropathy [name], DO  Taking Active   aspirin EC 325 MG tablet [id number] No Take 325 mg by mouth daily. [provider] Taking Active   Blood Glucose Monitoring Suppl [facility] VERIO) w/Device KIT [id number] No Use to test blood sugar twice daily as directed; DX: E11.69 [name], DO Taking Active Self  Continuous Glucose Sensor (FREESTYLE LIBRE 3 SENSOR) MISC  [id number] No Place 1 sensor on the skin every 14 days. Use to check glucose continuously; DX:E11.65 [name], DO Taking Active   furosemide (LASIX) 40 MG tablet [id number] No TAKE 1 TABLET BY MOUTH ONCE DAILY AS NEEDED FOR EDEMA OR FLUID [name], DO Taking Active            Med Note Sharen Hones   Thu Apr 21, 2023  8:41 AM) Reports taking daily   gabapentin (NEURONTIN) 600 MG tablet [id number] No TAKE 1 & 1/2 (ONE & ONE-HALF) TABLETS BY MOUTH THREE TIMES DAILY Delynn Flavin M, DO Taking Active   glimepiride (AMARYL) 2 MG tablet [id number] No Take 2 mg by mouth daily. [provider] Taking Active   glucose blood (ONETOUCH VERIO) test strip [id number] No Use to test blood sugar twice daily as directed; DX: E11.69 [name], DO Taking Active Self  Insulin Glargine (BASAGLAR KWIKPEN) 100 UNIT/ML [id number] No Inject 60 Units into the skin at bedtime.  Patient taking differently: Inject 40 Units into the skin at bedtime.   [name], DO Taking Active            Med Note Danella Maiers   Tue Mar 22, 2023  1:01 PM) Via [employer] patient assistance program--ESCRIBE TO [facility]  methocarbamol (ROBAXIN) 500 MG tablet [id number] No Take 1 tablet (500 mg total) by mouth every 6 (six) hours as needed for muscle spasms. Delynn Flavin M, DO Taking Active   metoprolol tartrate (LOPRESSOR) 25 MG tablet [id number]  Take 1 tablet (25 mg total) by mouth 2 (two) times daily. Sharlene Dory, NP  Active   nitroGLYCERIN (NITROSTAT) 0.4 MG SL tablet [id number] No Place 1 tablet (0.4 mg total) under the tongue every 5 (five) minutes x 3 doses as needed for chest pain (if no relief after 3rd dose, proceed to ED or call 911). Sharlene Dory, NP Taking Active   omeprazole (PRILOSEC) 40 MG capsule [id number] No Take 1 capsule (40 mg total) by mouth daily. For heartburn Delynn Flavin M, DO Taking Active   OneTouch Delica Lancets 30G MISC  [id number] No Use to test blood sugar twice daily as directed; DX: E11.69 [name], DO Taking Active Self  OneTouch Delica Lancets 33G MISC [id number] No Check blood sugar BID and prn  E11.9 Delynn Flavin M, DO Taking Active Self  rosuvastatin (CRESTOR) 20 MG tablet [id number] No Take 1 tablet (20 mg total) by mouth at bedtime. Delynn Flavin M, DO Taking Active   Semaglutide (RYBELSUS) 7 MG TABS [id number] No Take 14 mg by mouth daily. [provider] Taking Active            Med Note Cresenciano Genre, Lilla Shook   Tue Sep 22, 2021  3:08 PM) VIA [employer] PATIENT ASSISTANCE PROGRAM  sertraline (ZOLOFT) 50 MG tablet [id number] No Take 1 tablet (50 mg total) by mouth daily. Daily for depression/ anxiety Delynn Flavin M, DO Taking Active   telmisartan (MICARDIS) 40 MG tablet [id number] No Take 1 tablet (40 mg total) by mouth daily. [name], DO Taking Active   Vitamin  D, Ergocalciferol, (DRISDOL) 1.25 MG (50000 UNIT) CAPS capsule [id number] No TAKE 1 CAPSULE BY MOUTH ONCE A WEEK, THEN TAKE OTC VITAMIN D 800IU DAILY Gottschalk, Ashly M, DO Taking Active               Assessment/Plan:   -Continue current regimen; d/c glimepiride -compliant with statin, ARB -refills sent to patient assistance programs -continue libre 3 CGM  Follow Up Plan: 3 months or as needed per patient   Kieth Brightly, PharmD, BCACP Clinical Pharmacist, [facility]

## 2023-05-06 NOTE — Progress Notes (Signed)
05/06/2023 Name: [name] MRN: [id number] DOB: [birth year]  Chief Complaint  Patient presents with   Diabetes    Patient Assistance (Rybelsus & Basaglar)  Concerns with their medications: Yes  Patient reports access/transportation concerns to their pharmacy: No  Patient reports adherence concerns with their medications:  No     Diabetes:  Current medications: Basaglar, Rybelsus 14mg , still on glimepiride? D/c Medications tried in the past: metformin, dapagliflozin, sitagliptin  Current glucose readings: libre 2; FBG<135  Current medication access support: patient needs to re-enroll for [employer] patient assistance (Rybelsus).  Will make sure patient is also re-enrolled for Basaglar via [employer]. [facility]   Objective:  Lab Results  Component Value Date   HGBA1C 6.9 (H) 03/21/2023    Lab Results  Component Value Date   CREATININE 1.32 (H) 03/21/2023   BUN 16 03/21/2023   NA 145 (H) 03/21/2023   K 4.2 03/21/2023   CL 106 03/21/2023   CO2 20 03/21/2023    Lab Results  Component Value Date   CHOL 151 08/09/2022   HDL 38 (L) 08/09/2022   LDLCALC 76 08/09/2022   LDLDIRECT 52 01/12/2022   TRIG 224 (H) 08/09/2022   CHOLHDL 4.0 08/09/2022    Medications Reviewed Today     Reviewed by Karolee Stamps, LCSW (Social Worker) on 05/03/23 at 1815  Med List Status: <None>   Medication Order Taking? Sig Documenting Provider Last Dose Status Informant  acetaminophen (TYLENOL) 325 MG tablet [id number] No Take 975 mg by mouth every 8 (eight) hours as needed. [provider] Taking Active   albuterol (VENTOLIN HFA) 108 (90 Base) MCG/ACT inhaler [id number] No INHALE 2 PUFFS BY MOUTH EVERY 4 HOURS AS NEEDED FOR WHEEZING OR SHORTNESS OF BREATH (FOR  RESCUE) [name], DO Taking Active   Alpha-Lipoic Acid 600 MG CAPS [id number] No Take 1 capsule (600 mg total) by mouth daily. For diabetic neuropathy [name], DO  Taking Active   aspirin EC 325 MG tablet [id number] No Take 325 mg by mouth daily. [provider] Taking Active   Blood Glucose Monitoring Suppl [facility] VERIO) w/Device KIT [id number] No Use to test blood sugar twice daily as directed; DX: E11.69 [name], DO Taking Active Self  Continuous Glucose Sensor (FREESTYLE LIBRE 3 SENSOR) MISC [id number] No Place 1 sensor on the skin every 14 days. Use to check glucose continuously; DX:E11.65 [name], DO Taking Active   furosemide (LASIX) 40 MG tablet [id number] No TAKE 1 TABLET BY MOUTH ONCE DAILY AS NEEDED FOR EDEMA OR FLUID [name], DO Taking Active            Med Note Sharen Hones   Thu Apr 21, 2023  8:41 AM) Reports taking daily   gabapentin (NEURONTIN) 600 MG tablet [id number] No TAKE 1 & 1/2 (ONE & ONE-HALF) TABLETS BY MOUTH THREE TIMES DAILY Delynn Flavin M, DO Taking Active   glimepiride (AMARYL) 2 MG tablet [id number] No Take 2 mg by mouth daily. [provider] Taking Active   glucose blood (ONETOUCH VERIO) test strip [id number] No Use to test blood sugar twice daily as directed; DX: E11.69 [name], DO Taking Active Self  Insulin Glargine (BASAGLAR KWIKPEN) 100 UNIT/ML [id number] No Inject 60 Units into the skin at bedtime.  Patient taking differently: Inject 40 Units into the skin at bedtime.   [name], DO Taking Active  Med Note Danella Maiers   Tue Mar 22, 2023  1:01 PM) Via [employer] patient assistance program--ESCRIBE TO [facility]  methocarbamol (ROBAXIN) 500 MG tablet [id number] No Take 1 tablet (500 mg total) by mouth every 6 (six) hours as needed for muscle spasms. Delynn Flavin M, DO Taking Active   metoprolol tartrate (LOPRESSOR) 25 MG tablet [id number]  Take 1 tablet (25 mg total) by mouth 2 (two) times daily. Sharlene Dory, NP  Active   nitroGLYCERIN (NITROSTAT) 0.4 MG SL tablet [id number] No Place 1 tablet (0.4 mg  total) under the tongue every 5 (five) minutes x 3 doses as needed for chest pain (if no relief after 3rd dose, proceed to ED or call 911). Sharlene Dory, NP Taking Active   omeprazole (PRILOSEC) 40 MG capsule [id number] No Take 1 capsule (40 mg total) by mouth daily. For heartburn Delynn Flavin M, DO Taking Active   OneTouch Delica Lancets 30G MISC [id number] No Use to test blood sugar twice daily as directed; DX: E11.69 [name], DO Taking Active Self  OneTouch Delica Lancets 33G MISC [id number] No Check blood sugar BID and prn  E11.9 Delynn Flavin M, DO Taking Active Self  rosuvastatin (CRESTOR) 20 MG tablet [id number] No Take 1 tablet (20 mg total) by mouth at bedtime. Delynn Flavin M, DO Taking Active   Semaglutide (RYBELSUS) 7 MG TABS [id number] No Take 14 mg by mouth daily. [provider] Taking Active            Med Note Cresenciano Genre, Lilla Shook   Tue Sep 22, 2021  3:08 PM) VIA [employer] PATIENT ASSISTANCE PROGRAM  sertraline (ZOLOFT) 50 MG tablet [id number] No Take 1 tablet (50 mg total) by mouth daily. Daily for depression/ anxiety Delynn Flavin M, DO Taking Active   telmisartan (MICARDIS) 40 MG tablet [id number] No Take 1 tablet (40 mg total) by mouth daily. Delynn Flavin M, DO Taking Active   Vitamin D, Ergocalciferol, (DRISDOL) 1.25 MG (50000 UNIT) CAPS capsule [id number] No TAKE 1 CAPSULE BY MOUTH ONCE A WEEK, THEN TAKE OTC VITAMIN D 800IU DAILY [name], DO Taking Active               Kieth Brightly, PharmD, BCACP Clinical Pharmacist, [facility]

## 2023-05-08 DIAGNOSIS — E1142 Type 2 diabetes mellitus with diabetic polyneuropathy: Secondary | ICD-10-CM

## 2023-05-08 DIAGNOSIS — E1169 Type 2 diabetes mellitus with other specified complication: Secondary | ICD-10-CM

## 2023-05-08 DIAGNOSIS — E785 Hyperlipidemia, unspecified: Secondary | ICD-10-CM

## 2023-05-08 DIAGNOSIS — Z794 Long term (current) use of insulin: Secondary | ICD-10-CM

## 2023-05-09 ENCOUNTER — Telehealth: Payer: Self-pay | Admitting: Orthopedic Surgery

## 2023-05-09 ENCOUNTER — Ambulatory Visit (HOSPITAL_COMMUNITY): Payer: PPO | Admitting: Physical Therapy

## 2023-05-09 NOTE — Telephone Encounter (Signed)
Dr. Dallas Schimke pt - pt lvm stating she has been cleared for hand surgery, she would like to get this setup.

## 2023-05-11 ENCOUNTER — Ambulatory Visit (HOSPITAL_COMMUNITY): Payer: PPO | Admitting: Physical Therapy

## 2023-05-13 NOTE — Telephone Encounter (Signed)
Message sent to surgical coordinator to make sure all information has been received, will contact pt once all information has been confirmed to proceed with surgery.

## 2023-05-16 ENCOUNTER — Ambulatory Visit (HOSPITAL_COMMUNITY): Payer: PPO

## 2023-05-17 ENCOUNTER — Ambulatory Visit (INDEPENDENT_AMBULATORY_CARE_PROVIDER_SITE_OTHER): Payer: PPO | Admitting: *Deleted

## 2023-05-17 ENCOUNTER — Encounter: Payer: Self-pay | Admitting: *Deleted

## 2023-05-17 ENCOUNTER — Ambulatory Visit: Payer: Self-pay | Admitting: *Deleted

## 2023-05-17 ENCOUNTER — Telehealth: Payer: Self-pay | Admitting: Orthopedic Surgery

## 2023-05-17 DIAGNOSIS — E1159 Type 2 diabetes mellitus with other circulatory complications: Secondary | ICD-10-CM

## 2023-05-17 DIAGNOSIS — E1165 Type 2 diabetes mellitus with hyperglycemia: Secondary | ICD-10-CM

## 2023-05-17 NOTE — Chronic Care Management (AMB) (Signed)
Chronic Care Management   CCM RN Visit Note  05/17/2023 Name: [name] MRN: [id number] DOB: 1961/05/15  Subjective: [name] is a 62 y.o. year old female who is a primary care patient of [name], DO. The patient was referred to the Chronic Care Management team for assistance with care management needs subsequent to provider initiation of CCM services and plan of care.    Today's Visit:  Engaged with patient by telephone for follow up visit.        Goals Addressed             This Visit's Progress    CCM (DIABETES) EXPECTED OUTCOME: MONITOR, SELF-MANAGE AND REDUCE SYMPTOMS OF DIABETES       Current Barriers:  Knowledge Deficits related to Diabetes management Chronic Disease Management support and education needs related to Diabetes, diet Financial Constraints.  Patient reports CBG is monitored with Freestyle Libre with fasting ranges 119-190, random ranges low- mid 100's  Patient reports she has initial consult for wound care on 04/07/23 due to "a puppy bit me on right leg",  pt is finished with antibiotic, update- pt states she decided not to go to wound center and states " the wound is almost completely healed"  Planned Interventions: Provided education to patient about basic DM disease process; Reviewed medications with patient and discussed importance of medication adherence;        Counseled on importance of regular laboratory monitoring as prescribed;        Advised patient, providing education and rationale, to check cbg per CGM  and record        Review of patient status, including review of consultants reports, relevant laboratory and other test results, and medications completed;       Advised patient to discuss non healing wounds, signs of infection  with provider;      Reinforced signs/ symptoms of infection, reportable signs/ symptoms Reinforced carbohydrate modified diet  Symptom Management: Take medications as prescribed   Attend all scheduled  provider appointments Call pharmacy for medication refills 3-7 days in advance of running out of medications Attend church or other social activities Call provider office for new concerns or questions  Work with the social worker to address care coordination needs and will continue to work with the clinical team to address health care and disease management related needs check blood sugar at prescribed times: [name]  check feet daily for cuts, sores or redness enter blood sugar readings and medication or insulin into daily log take the blood sugar log to all doctor visits take the blood sugar meter to all doctor visits trim toenails straight across limit fast food meals to no more than 1 per week manage portion size prepare main meal at home 3 to 5 days each week read food labels for fat, fiber, carbohydrates and portion size set a realistic goal keep feet up while sitting wash and dry feet carefully every day wear comfortable, cotton socks Call your doctor for signs of skin infection/ non healing- redness, foul smelling drainage, warmth, fever, if overall you are not feeling well  Follow Up Plan: Telephone follow up appointment with care management team member scheduled for:  08/04/23 at 215 pm       CCM (HYPERTENSION) EXPECTED OUTCOME: SELF-MANAGE AND REDUCE SYMPTOMS OF HYPERTENSION       Current Barriers:  Knowledge Deficits related to Hypertension management Care Coordination needs related to pharmacy/ social work needs in a patient with Hypertension  Chronic Disease Management support 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  No Advanced Directives in place- pt declines Patient reports she lives with spouse, has cane and walker for ambulation, spouse assists with cooking due to pt cannot stand up for long periods, pt has had no falls, spouse provides transportation Patient reports she is on payment plan for power bill and has difficulty affording  food, receives 23$/ month food stamps Patient has blood pressure cuff but does not monitor blood pressure Patient reports she has all medications including albuterol and using as prescribed  Planned Interventions: Evaluation of current treatment plan related to hypertension self management and patient's adherence to plan as established by provider;   Reviewed medications with patient and discussed importance of compliance;  Counseled on the importance of exercise goals with target of 150 minutes per week Advised patient, providing education and rationale, to monitor blood pressure daily and record, calling PCP for findings outside established parameters;  Discussed complications of poorly controlled blood pressure such as heart disease, stroke, circulatory complications, vision complications, kidney impairment, sexual dysfunction;  Screening for signs and symptoms of depression related to chronic disease state;  Reinforced low sodium diet  Symptom Management: Take medications as prescribed   Attend all scheduled provider appointments Call pharmacy for medication refills 3-7 days in advance of running out of medications Attend church or other social activities Call provider office for new concerns or questions  Work with the social worker to address care coordination needs and will continue to work with the clinical team to address health care and disease management related needs check blood pressure 3 times per week choose a place to take my blood pressure (home, clinic or office, retail store) write blood pressure results in a log or diary learn about high blood pressure keep a blood pressure log keep all doctor appointments take medications for blood pressure exactly as prescribed begin an exercise program report new symptoms to your doctor eat more whole grains, fruits and vegetables, lean meats and healthy fats Follow low sodium diet- read food labels for sodium content  Follow Up  Plan: Telephone follow up appointment with care management team member scheduled for:  08/04/23 at 215 pm          Plan:Telephone follow up appointment with care management team member scheduled for:  08/04/23 t 215 pm  Irving Shows [facility], BSN RN Case Manager [facility] [phone]

## 2023-05-17 NOTE — Patient Instructions (Signed)
Please call the care guide team at [phone] if you need to cancel or reschedule your appointment.   If you are experiencing a Mental Health or Behavioral Health Crisis or need someone to talk to, please call the Suicide and Crisis Lifeline: 988 call the [place] National Suicide Prevention Lifeline: 5178684058 or TTY: (250)828-3898 TTY 302-524-7572) to talk to a trained counselor call 1-800-273-TALK (toll free, 24 hour hotline) go to [facility] [address], [place] [phone]) call the [facility]: [phone] call 911   Following is a copy of the CCM Program Consent:  CCM service includes personalized support from designated clinical staff supervised by the physician, including individualized plan of care and coordination with other care providers 24/7 contact phone numbers for assistance for urgent and routine care needs. Service will only be billed when office clinical staff spend 20 minutes or more in a month to coordinate care. Only one practitioner may furnish and bill the service in a calendar month. The patient may stop CCM services at amy time (effective at the end of the month) by phone call to the office staff. The patient will be responsible for cost sharing (co-pay) or up to 20% of the service fee (after annual deductible is met)  Following is a copy of your full provider care plan:   Goals Addressed             This Visit's Progress    CCM (DIABETES) EXPECTED OUTCOME: MONITOR, SELF-MANAGE AND REDUCE SYMPTOMS OF DIABETES       Current Barriers:  Knowledge Deficits related to Diabetes management Chronic Disease Management support and education needs related to Diabetes, diet Financial Constraints.  Patient reports CBG is monitored with Freestyle Libre with fasting ranges 119-190, random ranges low- mid 100's  Patient reports she has initial consult for wound care on 04/07/23 due to "a puppy bit me on right  leg",  pt is finished with antibiotic, update- pt states she decided not to go to wound center and states " the wound is almost completely healed"  Planned Interventions: Provided education to patient about basic DM disease process; Reviewed medications with patient and discussed importance of medication adherence;        Counseled on importance of regular laboratory monitoring as prescribed;        Advised patient, providing education and rationale, to check cbg per CGM  and record        Review of patient status, including review of consultants reports, relevant laboratory and other test results, and medications completed;       Advised patient to discuss non healing wounds, signs of infection  with provider;      Reinforced signs/ symptoms of infection, reportable signs/ symptoms Reinforced carbohydrate modified diet  Symptom Management: Take medications as prescribed   Attend all scheduled provider appointments Call pharmacy for medication refills 3-7 days in advance of running out of medications Attend church or other social activities Call provider office for new concerns or questions  Work with the social worker to address care coordination needs and will continue to work with the clinical team to address health care and disease management related needs check blood sugar at prescribed times: [name]  check feet daily for cuts, sores or redness enter blood sugar readings and medication or insulin into daily log take the blood sugar log to all doctor visits take the blood sugar meter to all doctor visits trim toenails straight across limit fast food  meals to no more than 1 per week manage portion size prepare main meal at home 3 to 5 days each week read food labels for fat, fiber, carbohydrates and portion size set a realistic goal keep feet up while sitting wash and dry feet carefully every day wear comfortable, cotton socks Call your doctor for signs of skin infection/  non healing- redness, foul smelling drainage, warmth, fever, if overall you are not feeling well  Follow Up Plan: Telephone follow up appointment with care management team member scheduled for:  08/04/23 at 215 pm       CCM (HYPERTENSION) EXPECTED OUTCOME: SELF-MANAGE AND REDUCE SYMPTOMS OF HYPERTENSION       Current Barriers:  Knowledge Deficits related to Hypertension management Care Coordination needs related to pharmacy/ social work needs in a patient with Hypertension Chronic Disease Management support 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  No Advanced Directives in place- pt declines Patient reports she lives with spouse, has cane and walker for ambulation, spouse assists with cooking due to pt cannot stand up for long periods, pt has had no falls, spouse provides transportation Patient reports she is on payment plan for power bill and has difficulty affording food, receives 23$/ month food stamps Patient has blood pressure cuff but does not monitor blood pressure Patient reports she has all medications including albuterol and using as prescribed  Planned Interventions: Evaluation of current treatment plan related to hypertension self management and patient's adherence to plan as established by provider;   Reviewed medications with patient and discussed importance of compliance;  Counseled on the importance of exercise goals with target of 150 minutes per week Advised patient, providing education and rationale, to monitor blood pressure daily and record, calling PCP for findings outside established parameters;  Discussed complications of poorly controlled blood pressure such as heart disease, stroke, circulatory complications, vision complications, kidney impairment, sexual dysfunction;  Screening for signs and symptoms of depression related to chronic disease state;  Reinforced low sodium diet  Symptom Management: Take medications as prescribed   Attend  all scheduled provider appointments Call pharmacy for medication refills 3-7 days in advance of running out of medications Attend church or other social activities Call provider office for new concerns or questions  Work with the social worker to address care coordination needs and will continue to work with the clinical team to address health care and disease management related needs check blood pressure 3 times per week choose a place to take my blood pressure (home, clinic or office, retail store) write blood pressure results in a log or diary learn about high blood pressure keep a blood pressure log keep all doctor appointments take medications for blood pressure exactly as prescribed begin an exercise program report new symptoms to your doctor eat more whole grains, fruits and vegetables, lean meats and healthy fats Follow low sodium diet- read food labels for sodium content  Follow Up Plan: Telephone follow up appointment with care management team member scheduled for:  08/04/23 at 215 pm          The patient verbalized understanding of instructions, educational materials, and care plan provided today and DECLINED offer to receive copy of patient instructions, educational materials, and care plan.  Telephone follow up appointment with care management team member scheduled for:  08/04/23 at 215 pm

## 2023-05-17 NOTE — Telephone Encounter (Signed)
Dr. Dallas Schimke pt - pt lvm stating that she called over a week ago about her surgery and she hasn't heard anything.

## 2023-05-18 ENCOUNTER — Ambulatory Visit (HOSPITAL_COMMUNITY): Payer: PPO | Admitting: Physical Therapy

## 2023-05-18 ENCOUNTER — Encounter: Payer: Self-pay | Admitting: *Deleted
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
## 2023-05-24 ENCOUNTER — Encounter: Payer: Self-pay | Admitting: Family Medicine

## 2023-05-24 ENCOUNTER — Ambulatory Visit: Payer: PPO | Admitting: Family Medicine

## 2023-05-24 VITALS — BP 106/56 | HR 77 | Temp 98.3°F | Ht 62.0 in | Wt 291.0 lb

## 2023-05-24 DIAGNOSIS — N3281 Overactive bladder: Secondary | ICD-10-CM | POA: Diagnosis not present

## 2023-05-24 DIAGNOSIS — N1831 Chronic kidney disease, stage 3a: Secondary | ICD-10-CM | POA: Diagnosis not present

## 2023-05-24 DIAGNOSIS — N3946 Mixed incontinence: Secondary | ICD-10-CM

## 2023-05-24 DIAGNOSIS — E559 Vitamin D deficiency, unspecified: Secondary | ICD-10-CM

## 2023-05-24 DIAGNOSIS — G4733 Obstructive sleep apnea (adult) (pediatric): Secondary | ICD-10-CM | POA: Diagnosis not present

## 2023-05-24 LAB — URINALYSIS, ROUTINE W REFLEX MICROSCOPIC
Bilirubin, UA: NEGATIVE
Glucose, UA: NEGATIVE
Ketones, UA: NEGATIVE
Leukocytes,UA: NEGATIVE
Nitrite, UA: NEGATIVE
Protein,UA: NEGATIVE
RBC, UA: NEGATIVE
Specific Gravity, UA: 1.01 (ref 1.005–1.030)
Urobilinogen, Ur: 0.2 mg/dL (ref 0.2–1.0)
pH, UA: 5.5 (ref 5.0–7.5)

## 2023-05-24 MED ORDER — OXYBUTYNIN CHLORIDE ER 5 MG PO TB24
5.0000 mg | ORAL_TABLET | Freq: Every day | ORAL | 3 refills | Status: DC
Start: 2023-05-24 — End: 2024-03-28

## 2023-05-24 NOTE — Progress Notes (Signed)
Subjective: ZO:XWRUEAV issues PCP: [name], DO WUJ:WJXBJ [name] is a 62 y.o. female presenting to clinic today for:  1.  Bladder problems Patient is accompanied today's visit by her granddaughter.  She notes that she has been having couple year history of bladder issues where she will have urinary incontinence, leakage.  This is worse at nighttime.  She admits to frequency.  No dysuria, hematuria.  Has not been on any bladder medicines but would be willing to try them.  She is on Lasix daily as needed for edema  2.  Headache Patient reports morning headache that has been present for the last couple of weeks.  She is not sure if this is sinus issues or something else.  She does report sometimes pain is behind her eyeballs.  She reports no unilateral weakness, sensory changes, speech difficulty or falls.  She has been diagnosed with obstructive sleep apnea but has not been compliant with her CPAP machine for at least 4 to 5 years due to it falling off in the middle of the night.  She thinks this had oxygen bled into it as well.  She notes that she discontinued utilization of it because her insurance was going to charge her $100 per month to maintain it and she could not afford this.   ROS: Per HPI  Allergies  Allergen Reactions   Duloxetine    Farxiga [Dapagliflozin]     RECURRENT YEAST INFECTIONS   Trulicity [Dulaglutide]     GI adverse effects (okay on Rybelsus)   Morphine And Codeine Nausea And Vomiting   Past Medical History:  Diagnosis Date   Arthritis    CAD (coronary artery disease)    BMS to circumflex 2007 - Dr. Juanda Chance   CKD (chronic kidney disease) stage 3, GFR 30-59 ml/min (HCC)    Essential hypertension    Falls frequently    GERD (gastroesophageal reflux disease)    HA (headache)    Hyperlipidemia    Hyperlipidemia associated with type 2 diabetes mellitus (HCC) 02/03/2011   Left-sided face pain    Morbid obesity (HCC)    Noncompliance    OSA  (obstructive sleep apnea)    Uses CPAP   Type 2 diabetes mellitus (HCC)    Urinary incontinence     Current Outpatient Medications:    acetaminophen (TYLENOL) 325 MG tablet, Take 975 mg by mouth every 8 (eight) hours as needed., Disp: , Rfl:    albuterol (VENTOLIN HFA) 108 (90 Base) MCG/ACT inhaler, INHALE 2 PUFFS BY MOUTH EVERY 4 HOURS AS NEEDED FOR WHEEZING OR SHORTNESS OF BREATH (FOR  RESCUE), Disp: 9 g, Rfl: 0   Alpha-Lipoic Acid 600 MG CAPS, Take 1 capsule (600 mg total) by mouth daily. For diabetic neuropathy, Disp: 100 capsule, Rfl: 3   aspirin EC 325 MG tablet, Take 325 mg by mouth daily., Disp: , Rfl:    Blood Glucose Monitoring Suppl (ONETOUCH VERIO) w/Device KIT, Use to test blood sugar twice daily as directed; DX: E11.69, Disp: 1 kit, Rfl: 1   Continuous Glucose Sensor (FREESTYLE LIBRE 3 SENSOR) MISC, Place 1 sensor on the skin every 14 days. Use to check glucose continuously; DX:E11.65, Disp: 2 each, Rfl: 5   furosemide (LASIX) 40 MG tablet, TAKE 1 TABLET BY MOUTH ONCE DAILY AS NEEDED FOR EDEMA OR FLUID, Disp: 90 tablet, Rfl: 3   gabapentin (NEURONTIN) 600 MG tablet, TAKE 1 & 1/2 (ONE & ONE-HALF) TABLETS BY MOUTH THREE TIMES DAILY, Disp: 270 tablet, Rfl:  3   glimepiride (AMARYL) 2 MG tablet, Take 2 mg by mouth daily., Disp: , Rfl:    glucose blood (ONETOUCH VERIO) test strip, Use to test blood sugar twice daily as directed; DX: E11.69, Disp: 100 each, Rfl: 5   Insulin Glargine (BASAGLAR KWIKPEN) 100 UNIT/ML, Inject 40 Units into the skin at bedtime., Disp: 45 mL, Rfl: 5   methocarbamol (ROBAXIN) 500 MG tablet, Take 1 tablet (500 mg total) by mouth every 6 (six) hours as needed for muscle spasms., Disp: 120 tablet, Rfl: 1   metoprolol tartrate (LOPRESSOR) 25 MG tablet, Take 1 tablet (25 mg total) by mouth 2 (two) times daily., Disp: 180 tablet, Rfl: 2   nitroGLYCERIN (NITROSTAT) 0.4 MG SL tablet, Place 1 tablet (0.4 mg total) under the tongue every 5 (five) minutes x 3 doses as needed  for chest pain (if no relief after 3rd dose, proceed to ED or call 911)., Disp: 25 tablet, Rfl: 3   omeprazole (PRILOSEC) 40 MG capsule, Take 1 capsule (40 mg total) by mouth daily. For heartburn, Disp: 90 capsule, Rfl: 3   OneTouch Delica Lancets 30G MISC, Use to test blood sugar twice daily as directed; DX: E11.69, Disp: 100 each, Rfl: 3   OneTouch Delica Lancets 33G MISC, Check blood sugar BID and prn  E11.9, Disp: 100 each, Rfl: 11   rosuvastatin (CRESTOR) 20 MG tablet, Take 1 tablet (20 mg total) by mouth at bedtime., Disp: 90 tablet, Rfl: 3   Semaglutide (RYBELSUS) 7 MG TABS, Take 14 mg by mouth daily., Disp: , Rfl:    sertraline (ZOLOFT) 50 MG tablet, Take 1 tablet (50 mg total) by mouth daily. Daily for depression/ anxiety, Disp: 90 tablet, Rfl: 3   telmisartan (MICARDIS) 40 MG tablet, Take 1 tablet (40 mg total) by mouth daily., Disp: 90 tablet, Rfl: 3   Vitamin D, Ergocalciferol, (DRISDOL) 1.25 MG (50000 UNIT) CAPS capsule, TAKE 1 CAPSULE BY MOUTH ONCE A WEEK, THEN TAKE OTC VITAMIN D 800IU DAILY, Disp: 5 capsule, Rfl: 0 Social History   Socioeconomic History   Marital status: Married    Spouse name: [name]   Number of children: 5   Years of education: 10 th   Highest education level: 10th grade  Occupational History   Occupation: disabled     Comment: disabled  Tobacco Use   Smoking status: Every Day    Current packs/day: 1.00    Average packs/day: 1 pack/day for 51.0 years (51.0 ttl pk-yrs)    Types: Cigarettes    Start date: 05/14/1972    Passive exposure: Current   Smokeless tobacco: Never   Tobacco comments:    Smoking Cessation Classes, Services, Agencies & Resources Offered.  Vaping Use   Vaping status: Never Used  Substance and Sexual Activity   Alcohol use: No    Alcohol/week: 0.0 standard drinks of alcohol   Drug use: No   Sexual activity: Not Currently    Partners: Male    Birth control/protection: Surgical  Other Topics Concern   Not on file  Social  History Narrative   Patient lives at home with her husband. Patient is disabled.   Children live nearby   Patient has 10 th grade education.   Right handed.   Caffeine- one  cup of coffee and  One soda Dr.Pepper/ tea. daily   Social Determinants of Health   Financial Resource Strain: Medium Risk (04/06/2023)   Overall Financial Resource Strain (CARDIA)    Difficulty of Paying Living Expenses: Somewhat  hard  Food Insecurity: Food Insecurity Present (04/06/2023)   Hunger Vital Sign    Worried About Running Out of Food in the Last Year: Often true    Ran Out of Food in the Last Year: Often true  Transportation Needs: No Transportation Needs (04/06/2023)   PRAPARE - Administrator, Civil Service (Medical): No    Lack of Transportation (Non-Medical): No  Physical Activity: Inactive (04/06/2023)   Exercise Vital Sign    Days of Exercise per Week: 0 days    Minutes of Exercise per Session: 0 min  Stress: No Stress Concern Present (04/06/2023)   [employer] of Occupational Health - Occupational Stress Questionnaire    Feeling of Stress : Only a little  Social Connections: Moderately Integrated (04/06/2023)   Social Connection and Isolation Panel [NHANES]    Frequency of Communication with Friends and Family: More than three times a week    Frequency of Social Gatherings with Friends and Family: More than three times a week    Attends Religious Services: More than 4 times per year    Active Member of [employer] or Organizations: No    Attends Banker Meetings: Never    Marital Status: Married  Catering manager Violence: Not At Risk (04/06/2023)   Humiliation, Afraid, Rape, and Kick questionnaire    Fear of Current or Ex-Partner: No    Emotionally Abused: No    Physically Abused: No    Sexually Abused: No   Family History  Problem Relation Age of Onset   Breast cancer Mother    Cancer Father    Coronary artery disease Father    Cancer - Colon Father    Diabetes  Father    High Cholesterol Father    Arthritis Sister    Asthma Sister    High Cholesterol Daughter    Thyroid disease Daughter    Hiatal hernia Son    Congenital heart disease Son     Objective: Office vital signs reviewed. BP (!) 106/56   Pulse 77   Temp 98.3 F (36.8 C)   Ht 5\' 2"  (1.575 m)   Wt 291 lb (132 kg)   SpO2 94%   BMI 53.22 kg/m   Physical Examination:  General: Awake, alert, morbidly obese, No acute distress HEENT: sclera white, MMM.  No gross rhinorrhea Cardio: regular rate and rhythm, S1S2 heard, no murmurs appreciated Pulm: clear to auscultation bilaterally, no wheezes, rhonchi or rales; normal work of breathing on room air Neuro: No focal neurologic deficits  Assessment/ Plan: 62 y.o. female   Mixed stress and urge urinary incontinence - Plan: Urine Culture, Urinalysis, Routine w reflex microscopic, oxybutynin (DITROPAN XL) 5 MG 24 hr tablet  Overactive bladder - Plan: oxybutynin (DITROPAN XL) 5 MG 24 hr tablet  Chronic kidney disease, stage 3a (HCC) - Plan: Renal Function Panel  Vitamin D deficiency - Plan: VITAMIN D 25 Hydroxy (Vit-D Deficiency, Fractures)  OSA (obstructive sleep apnea) - Plan: Ambulatory referral to Sleep Studies  Urinalysis without any evidence of infection.  I have started her on oxybutynin 5 mg.  I will reassess her in a couple of months  Check renal function given CKD.  Check vitamin D.  She was placed on BiPAP and I think that she will likely need adjustment of settings versus reevaluation given history of BiPAP requiring supplemental oxygen.  I have referred her back to her sleep specialist in [place] to coordinate this for her.  I think that the headaches  that she has been experiencing are likely manifestations of uncontrolled sleep apnea.  We had a long discussion today on the implications and impact from a cardiovascular standpoint of this untreated illness.  If not able to tolerate BiPAP she may need to consider  exploring alternative therapies  No orders of the defined types were placed in this encounter.  No orders of the defined types were placed in this encounter.    [name], DO [facility] [phone]

## 2023-05-24 NOTE — Patient Instructions (Signed)
Ditropan added for bladder. Take 1 tablet at night time.

## 2023-05-25 LAB — RENAL FUNCTION PANEL
Albumin: 4.2 g/dL (ref 3.9–4.9)
BUN/Creatinine Ratio: 12 (ref 12–28)
BUN: 17 mg/dL (ref 8–27)
CO2: 24 mmol/L (ref 20–29)
Calcium: 9.4 mg/dL (ref 8.7–10.3)
Chloride: 101 mmol/L (ref 96–106)
Creatinine, Ser: 1.44 mg/dL — ABNORMAL HIGH (ref 0.57–1.00)
Glucose: 109 mg/dL — ABNORMAL HIGH (ref 70–99)
Phosphorus: 3.9 mg/dL (ref 3.0–4.3)
Potassium: 4.4 mmol/L (ref 3.5–5.2)
Sodium: 140 mmol/L (ref 134–144)
eGFR: 41 mL/min/{1.73_m2} — ABNORMAL LOW (ref >59)

## 2023-05-25 LAB — VITAMIN D 25 HYDROXY (VIT D DEFICIENCY, FRACTURES): Vit D, 25-Hydroxy: 23.9 ng/mL — ABNORMAL LOW (ref 30.0–100.0)

## 2023-05-27 ENCOUNTER — Other Ambulatory Visit: Payer: Self-pay | Admitting: Family Medicine

## 2023-05-27 DIAGNOSIS — N3000 Acute cystitis without hematuria: Secondary | ICD-10-CM

## 2023-05-27 LAB — URINE CULTURE

## 2023-05-27 MED ORDER — CEPHALEXIN 500 MG PO CAPS
500.0000 mg | ORAL_CAPSULE | Freq: Two times a day (BID) | ORAL | 0 refills | Status: AC
Start: 2023-05-27 — End: 2023-06-03

## 2023-05-30 ENCOUNTER — Encounter: Payer: Self-pay | Admitting: *Deleted

## 2023-05-30 ENCOUNTER — Ambulatory Visit: Payer: Self-pay | Admitting: *Deleted

## 2023-05-30 NOTE — Patient Instructions (Signed)
Visit Information  Thank you for taking time to visit with me today. Please don't hesitate to contact me if I can be of assistance to you.   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 (pt-stated)   On track     Care Coordination Interventions:  Interventions Today    Flowsheet Row Most Recent Value  Chronic Disease   Chronic disease during today's visit Hypertension (HTN), Diabetes, Other  [Tobacco Abuse, Morbid Obesity & Generalized Anxiety Disorder]  General Interventions   General Interventions Discussed/Reviewed General Interventions Discussed, Labs, Vaccines, Doctor Visits, Health Screening, Annual Foot Exam, General Interventions Reviewed, Annual Eye Exam, Durable Medical Equipment (DME), Community Resources, Level of Care  [Encouraged]  Labs Hgb A1c every 3 months, Kidney Function  [Encouraged]  Vaccines COVID-19, Flu, Pneumonia, RSV, Shingles, Tetanus/Pertussis/Diphtheria  [Encouraged]  Doctor Visits Discussed/Reviewed Doctor Visits Discussed, Doctor Visits Reviewed, Annual Wellness Visits, PCP, Specialist  [Encouraged]  Health Screening Bone Density, Colonoscopy, Mammogram  [Encouraged]  Durable Medical Equipment (DME) BP Cuff, Glucomoter, Other  [Encouraged]  PCP/Specialist Visits Compliance with follow-up visit  [Encouraged]  Level of Care Applications, Assisted Living, Personal Care Services  [Encouraged]  Applications Medicaid, Personal Care Services  [Encouraged]  Exercise Interventions   Exercise Discussed/Reviewed Assistive device use and maintanence, Exercise Discussed, Weight Managment, Physical Activity, Exercise Reviewed  [Encouraged]  Physical Activity Discussed/Reviewed Physical Activity Discussed, Home Exercise Program (HEP), Physical Activity Reviewed, Types of exercise  [Encouraged]  Weight Management Weight loss  [Encouraged]   Education Interventions   Education Provided Provided Therapist, sports, Provided Web-based Education, Provided Education  [Encouraged]  Provided Verbal Education On Development worker, community, [employer], Medication, Exercise, Blood Sugar Monitoring, Applications, Eye Care, Foot Care, Nutrition, Mental Health/Coping with Illness, When to see the doctor  [Encouraged]  Applications Medicaid, Personal Care Services  [Encouraged]  Mental Health Interventions   Mental Health Discussed/Reviewed Other, Mental Health Discussed, Anxiety, Depression, Grief and Loss, Mental Health Reviewed, Coping Strategies, Substance Abuse, Suicide, Crisis  [Domestic Violence]  Nutrition Interventions   Nutrition Discussed/Reviewed Nutrition Discussed, Adding fruits and vegetables, Increasing proteins, Decreasing fats, Decreasing salt, Decreasing sugar intake, Carbohydrate meal planning, Portion sizes, Fluid intake, Nutrition Reviewed  [Encouraged]  Pharmacy Interventions   Pharmacy Dicussed/Reviewed Pharmacy Topics Discussed, Pharmacy Topics Reviewed, Medication Adherence, Affording Medications  [Encouraged]  Safety Interventions   Safety Discussed/Reviewed Safety Discussed, Safety Reviewed, Fall Risk  [Encouraged]  Advanced Directive Interventions   Advanced Directives Discussed/Reviewed Advanced Directives Discussed  [Completed]      Active Listening & Reflection Utilized.  Verbalization of Feelings Encouraged.  Emotional Support Provided. Motivation for Change Validated. Feelings of Hopefulness Acknowledged. Caregiver Resources Reviewed. Problem Solving Interventions Indicated. Task-Centered Solutions Activated.   Solution-Focused Strategies Employed. Acceptance & Commitment Therapy Performed. Cognitive Behavioral Therapy Initiated. Client-Centered Therapy Implemented. Encouraged Administration of Medications Exactly as Prescribed. Deep Breathing Exercises, Relaxation Techniques, & Mindfulness Meditation  Strategies Taught & Implementation Encouraged Daily. Encouraged Utilization of BiPAP Machine with Supplemental Oxygen for Uncontrolled Sleep Apnea & Daily Headaches, Discussing Implications & Impact from Cardiovascular Standpoint if Untreated. Encouraged Follow-Up with Sleep Specialist to Address Uncontrolled & Untreated Sleep Apnea. Encouraged Continued Education officer, environmental, Services, & Resources of Interest in [place], in An Effort to [employer]. Encouraged Continued Contact with Food Banks, Food Pantries, & [employer]  of Interest in [facility], in An Effort to Obtain Food & Nutritional Assistance. Encouraged Careers information officer with CSW (# [phone]), if You Have Questions, Need Assistance, or If Additional Social Work Needs Are Identified Between Now & Our Next Scheduled Follow-Up Outreach Call.      Our next appointment is by telephone on 06/28/2023 at 9:30 am.   Please call the care guide team at [phone] if you need to cancel or reschedule your appointment.   If you are experiencing a Mental Health or Behavioral Health Crisis or need someone to talk to, please call the Suicide and Crisis Lifeline: 988 call the [place] National Suicide Prevention Lifeline: (269) 457-5267 or TTY: (914)576-7792 TTY (531)139-4685) to talk to a trained counselor call 1-800-273-TALK (toll free, 24 hour hotline) go to [facility] [address] [place] [phone]) call the [facility] Crisis Line: [phone] call 911  Patient verbalizes understanding of instructions and care plan provided today and agrees to view in MyChart. Active MyChart status and patient understanding of how to access instructions and care plan via MyChart confirmed with patient.     Telephone follow up appointment with care management team member scheduled for:  06/28/2023 at 9:30 am.   [name], BSW, MSW, LCSW  Licensed Clinical  Social Worker  [facility]  Mailing [place]. [address] [place], [place] [address] Physical Address[address], [place], [place] [address] Toll Free Main # [phone] Fax # [phone] Cell # [phone] [name]

## 2023-05-30 NOTE — Patient Outreach (Signed)
Care Coordination   Follow Up Visit Note   05/30/2023  Name: [name] MRN: [id number] DOB: [birth year]  [name] is a 62 y.o. year old female who sees Stacey Ip, DO for primary care. I spoke with [name] by phone today.  What matters to the patients health and wellness today?  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 (pt-stated)   On track     Care Coordination Interventions:  Interventions Today    Flowsheet Row Most Recent Value  Chronic Disease   Chronic disease during today's visit Hypertension (HTN), Diabetes, Other  [Tobacco Abuse, Morbid Obesity & Generalized Anxiety Disorder]  General Interventions   General Interventions Discussed/Reviewed General Interventions Discussed, Labs, Vaccines, Doctor Visits, Health Screening, Annual Foot Exam, General Interventions Reviewed, Annual Eye Exam, Durable Medical Equipment (DME), Community Resources, Level of Care  [Encouraged]  Labs Hgb A1c every 3 months, Kidney Function  [Encouraged]  Vaccines COVID-19, Flu, Pneumonia, RSV, Shingles, Tetanus/Pertussis/Diphtheria  [Encouraged]  Doctor Visits Discussed/Reviewed Doctor Visits Discussed, Doctor Visits Reviewed, Annual Wellness Visits, PCP, Specialist  [Encouraged]  Health Screening Bone Density, Colonoscopy, Mammogram  [Encouraged]  Durable Medical Equipment (DME) BP Cuff, Glucomoter, Other  [Encouraged]  PCP/Specialist Visits Compliance with follow-up visit  [Encouraged]  Level of Care Applications, Assisted Living, Personal Care Services  [Encouraged]  Applications Medicaid, Personal Care Services  [Encouraged]  Exercise Interventions   Exercise Discussed/Reviewed Assistive device use and maintanence, Exercise Discussed, Weight Managment, Physical Activity, Exercise Reviewed  [Encouraged]  Physical  Activity Discussed/Reviewed Physical Activity Discussed, Home Exercise Program (HEP), Physical Activity Reviewed, Types of exercise  [Encouraged]  Weight Management Weight loss  [Encouraged]  Education Interventions   Education Provided Provided Therapist, sports, Provided Web-based Education, Provided Education  [Encouraged]  Provided Verbal Education On Development worker, community, [employer], Medication, Exercise, Blood Sugar Monitoring, Applications, Eye Care, Foot Care, Nutrition, Mental Health/Coping with Illness, When to see the doctor  [Encouraged]  Applications Medicaid, Personal Care Services  [Encouraged]  Mental Health Interventions   Mental Health Discussed/Reviewed Other, Mental Health Discussed, Anxiety, Depression, Grief and Loss, Mental Health Reviewed, Coping Strategies, Substance Abuse, Suicide, Crisis  [Domestic Violence]  Nutrition Interventions   Nutrition Discussed/Reviewed Nutrition Discussed, Adding fruits and vegetables, Increasing proteins, Decreasing fats, Decreasing salt, Decreasing sugar intake, Carbohydrate meal planning, Portion sizes, Fluid intake, Nutrition Reviewed  [Encouraged]  Pharmacy Interventions   Pharmacy Dicussed/Reviewed Pharmacy Topics Discussed, Pharmacy Topics Reviewed, Medication Adherence, Affording Medications  [Encouraged]  Safety Interventions   Safety Discussed/Reviewed Safety Discussed, Safety Reviewed, Fall Risk  [Encouraged]  Advanced Directive Interventions   Advanced Directives Discussed/Reviewed Advanced Directives Discussed  [Completed]      Active Listening & Reflection Utilized.  Verbalization of Feelings Encouraged.  Emotional Support Provided. Motivation for Change Validated. Feelings of Hopefulness Acknowledged. Caregiver Resources Reviewed. Problem Solving Interventions Indicated. Task-Centered Solutions Activated.   Solution-Focused Strategies Employed. Acceptance & Commitment Therapy Performed. Cognitive Behavioral Therapy  Initiated. Client-Centered Therapy Implemented. Encouraged Administration of Medications Exactly as Prescribed. Deep Breathing Exercises, Relaxation Techniques, & Mindfulness Meditation Strategies Taught & Implementation Encouraged Daily. Encouraged Utilization of BiPAP Machine with Supplemental Oxygen for Uncontrolled Sleep Apnea & Daily Headaches, Discussing Implications & Impact from Cardiovascular Standpoint if Untreated. Encouraged Follow-Up with Sleep Specialist to Address Uncontrolled & Untreated Sleep  Apnea. Encouraged Continued Contact with Lobbyist, Services, & Resources of Interest in [place], in An Effort to [employer]. Encouraged Continued Contact with Food Banks, Food Pantries, & Soup Kitchens of Interest in [place], in An Effort to [employer] & Nutritional Assistance. Encouraged Careers information officer with CSW (# [phone]), if You Have Questions, Need Assistance, or If Additional Social Work Needs Are Identified Between Now & Our Next Scheduled Follow-Up Outreach Call.      SDOH assessments and interventions completed:  Yes.  Care Coordination Interventions:  Yes, provided.   Follow up plan: Follow up call scheduled for 06/28/2023 at 9:30 am.   Encounter Outcome:  Pt. Visit Completed.   [name], BSW, MSW, LCSW  Licensed Restaurant manager, [employer] [facility]  [employer] [place] [address] [place], [place] [address] Physical Address-[address] [place], [place] [address] Toll Free Main # [phone] Fax # [phone] Cell # [phone] [name]

## 2023-06-03 ENCOUNTER — Telehealth: Payer: Self-pay | Admitting: Family Medicine

## 2023-06-07 NOTE — Patient Instructions (Signed)
[name]  06/07/2023     @PREFPERIOPPHARMACY @   Your procedure is scheduled on  06/13/2023.   Report to [facility] at  0600 A.M.   Call this number if you have problems the morning of surgery:  [phone]  If you experience any cold or flu symptoms such as cough, fever, chills, shortness of breath, etc. between now and your scheduled surgery, please notify us at the above number.   Remember:  Do not eat after midnight.   You may drink clear liquids until 0330 am on 06/13/2023.        Clear liquids allowed are:                    Water, Juice (No red color; non-citric and without pulp; diabetics please choose diet or no sugar options), Carbonated beverages (diabetics please choose diet or no sugar options), Clear Tea (No creamer, milk, or cream, including half & half and powdered creamer), Black Coffee Only (No creamer, milk or cream, including half & half and powdered creamer), Plain Jell-O Only (No red color; diabetics please choose no sugar options), Clear Sports drink (No red color; diabetics please choose diet or no sugar options), and Plain Popsicles Only (No red color; diabetics please choose no sugar options)        At 0330 am on 06/13/2023 drink your carb drink. You can have nothing else to drink after this.      Take 20 units of your glargine the night before your procedure.      DO NOT take any medications for diabetes the morning of your procedure.        Use your inhaler before you come and bring your rescue inhaler with you.   Take these medicines the morning of surgery with A SIP OF WATER             gabapentin, metoprolol, omeprazole, sertraline.     Do not wear jewelry, make-up or nail polish, including gel polish,  artificial nails, or any other type of covering on natural nails (fingers and  toes).  Do not wear lotions, powders, or perfumes, or deodorant.  Do not shave 48 hours prior to surgery.  Men may shave face and neck.  Do not bring  valuables to the hospital.  [facility] is not responsible for any belongings or valuables.  Contacts, dentures or bridgework may not be worn into surgery.  Leave your suitcase in the car.  After surgery it may be brought to your room.  For patients admitted to the hospital, discharge time will be determined by your treatment team.  Patients discharged the day of surgery will not be allowed to drive home and must have someone with them for 24 hours.    Special instructions:   DO NOT smoke tobacco or vape for 24 hours before your procedure.  Please read over the following fact sheets that you were given. Pain Booklet, Coughing and Deep Breathing, Surgical Site Infection Prevention, Anesthesia Post-op Instructions, and Care and Recovery After Surgery      Open Carpal Tunnel Release, Care After This sheet gives you information about how to care for yourself after your procedure. Your health care provider may also give you more specific instructions. If you have problems or questions, contact your health care provider. What can I expect after the procedure? After the procedure, it is common to have: Pain. Swelling. Wrist stiffness. Bruising. Follow these instructions  at home: Medicines Take over-the-counter and prescription medicines only as told by your health care provider. Ask your health care provider if the medicine prescribed to you: Requires you to avoid driving or using machinery. Can cause constipation. You may need to take these actions to prevent or treat constipation: Drink enough fluid to keep your urine pale yellow. Take over-the-counter or prescription medicines. Eat foods that are high in fiber, such as beans, whole grains, and fresh fruits and vegetables. Limit foods that are high in fat and processed sugars, such as fried or sweet foods. Bathing Do not take baths, swim, or use a hot tub until your health care provider approves. Ask your health care provider if you  may take showers. Keep your bandage (dressing) dry until your health care provider says it can be removed. Cover it with a watertight covering when you take a bath or a shower. If you have a splint or brace: Wear the splint or brace as told by your health care provider. You may need to wear it for 2-3 weeks. Remove it only as told by your health care provider. Loosen the splint or brace if your fingers tingle, become numb, or turn cold and blue. Keep the splint or brace clean. If the splint or brace is not waterproof: Do not let it get wet. Cover it with a watertight covering when you take a bath or a shower. Incision care  After the compression bandage has been removed, follow instructions from your health care provider about how to take care of your incision. Make sure you: Wash your hands with soap and water for at least 20 seconds before and after you change your bandage (dressing). If soap and water are not available, use hand sanitizer. Change your dressing as told by your health care provider. Leave stitches (sutures), skin glue, or adhesive strips in place. These skin closures may need to stay in place for 2 weeks or longer. If adhesive strip edges start to loosen and curl up, you may trim the loose edges. Do not remove adhesive strips completely unless your health care provider tells you to do that. Check your incision area every day for signs of infection. Check for: Redness. More swelling or pain. Fluid or blood. Warmth. Pus or a bad smell. Managing pain, stiffness, and swelling  If directed, put ice on the affected area. If you have a removable splint or brace, remove it as told by your health care provider. Put ice in a plastic bag. Place a towel between your skin and the bag. Leave the ice on for 20 minutes, 2-3 times a day. Do not fall asleep with ice pack on your skin. Remove the ice if your skin turns bright red. This is very important. If you cannot feel pain, heat, or  cold, you have a greater risk of damage to the area. Move your fingers often to avoid stiffness and to lessen swelling. Raise (elevate) your wrist above the level of your heart while you are sitting or lying down. Activity Do not drive until your health care provider approves. Use your hand carefully. Do not do activities that cause pain. You should be able to do light activities with your hand. Do not lift with your affected hand until your health care provider approves. Avoid pulling and pushing with the injured arm. Return to your normal activities as told by your health care provider. Ask your health care provider what activities are safe for you. If physical therapy was prescribed,  do exercises as told by your therapist. Physical therapy can help you heal faster and regain movement. General instructions Do not use any products that contain nicotine or tobacco, such as cigarettes and e-cigarettes. These can delay incision healing after surgery. If you need help quitting, ask your health care provider. Keep all follow-up visits. This is important. These include visits for physical therapy. Contact a health care provider if: You have redness around your incision. You have more swelling or pain. You have fluid or blood coming from your incision. Your incision feels warm to the touch. You have pus or a bad smell coming from your incision. You have a fever or chills. You have pain that does not get better with medicine. Your carpal tunnel symptoms do not go away after 2 months. Your carpal tunnel symptoms go away and then come back. Get help right away if: You have pain or numbness that is getting worse. Your fingers or fingertips become very pale or bluish in color. You are not able to move your fingers. Summary It is common to have wrist stiffness and bruising after a carpal tunnel release. Icing and raising (elevating) your wrist may help to lessen swelling and pain. Call your health  care provider if you have a fever or notice any signs of infection in your incision area. This information is not intended to replace advice given to you by your health care provider. Make sure you discuss any questions you have with your health care provider. Document Revised: 02/28/2020 Document Reviewed: 02/28/2020 Elsevier Patient Education  2024 [employer] Monitored Anesthesia Care, Care After The following information offers guidance on how to care for yourself after your procedure. Your health care provider may also give you more specific instructions. If you have problems or questions, contact your health care provider. What can I expect after the procedure? After the procedure, it is common to have: Tiredness. Little or no memory about what happened during or after the procedure. Impaired judgment when it comes to making decisions. Nausea or vomiting. Some trouble with balance. Follow these instructions at home: For the time period you were told by your health care provider:  Rest. Do not participate in activities where you could fall or become injured. Do not drive or use machinery. Do not drink alcohol. Do not take sleeping pills or medicines that cause drowsiness. Do not make important decisions or sign legal documents. Do not take care of children on your own. Medicines Take over-the-counter and prescription medicines only as told by your health care provider. If you were prescribed antibiotics, take them as told by your health care provider. Do not stop using the antibiotic even if you start to feel better. Eating and drinking Follow instructions from your health care provider about what you may eat and drink. Drink enough fluid to keep your urine pale yellow. If you vomit: Drink clear fluids slowly and in small amounts as you are able. Clear fluids include water, ice chips, low-calorie sports drinks, and fruit juice that has water added to it (diluted fruit  juice). Eat light and bland foods in small amounts as you are able. These foods include bananas, applesauce, rice, lean meats, toast, and crackers. General instructions  Have a responsible adult stay with you for the time you are told. It is important to have someone help care for you until you are awake and alert. If you have sleep apnea, surgery and some medicines can increase your risk for breathing problems. Follow instructions  from your health care provider about wearing your sleep device: When you are sleeping. This includes during daytime naps. While taking prescription pain medicines, sleeping medicines, or medicines that make you drowsy. Do not use any products that contain nicotine or tobacco. These products include cigarettes, chewing tobacco, and vaping devices, such as e-cigarettes. If you need help quitting, ask your health care provider. Contact a health care provider if: You feel nauseous or vomit every time you eat or drink. You feel light-headed. You are still sleepy or having trouble with balance after 24 hours. You get a rash. You have a fever. You have redness or swelling around the IV site. Get help right away if: You have trouble breathing. You have new confusion after you get home. These symptoms may be an emergency. Get help right away. Call 911. Do not wait to see if the symptoms will go away. Do not drive yourself to the hospital. This information is not intended to replace advice given to you by your health care provider. Make sure you discuss any questions you have with your health care provider. Document Revised: 03/22/2022 Document Reviewed: 03/22/2022 Elsevier Patient Education  2024 [employer] Inc. How to Use Chlorhexidine Before Surgery Chlorhexidine gluconate (CHG) is a germ-killing (antiseptic) solution that is used to clean the skin. It can get rid of the bacteria that normally live on the skin and can keep them away for about 24 hours. To clean your skin  with CHG, you may be given: A CHG solution to use in the shower or as part of a sponge bath. A prepackaged cloth that contains CHG. Cleaning your skin with CHG may help lower the risk for infection: While you are staying in the intensive care unit of the hospital. If you have a vascular access, such as a central line, to provide short-term or long-term access to your veins. If you have a catheter to drain urine from your bladder. If you are on a ventilator. A ventilator is a machine that helps you breathe by moving air in and out of your lungs. After surgery. What are the risks? Risks of using CHG include: A skin reaction. Hearing loss, if CHG gets in your ears and you have a perforated eardrum. Eye injury, if CHG gets in your eyes and is not rinsed out. The CHG product catching fire. Make sure that you avoid smoking and flames after applying CHG to your skin. Do not use CHG: If you have a chlorhexidine allergy or have previously reacted to chlorhexidine. On babies younger than 69 months of age. How to use CHG solution Use CHG only as told by your health care provider, and follow the instructions on the label. Use the full amount of CHG as directed. Usually, this is one bottle. During a shower Follow these steps when using CHG solution during a shower (unless your health care provider gives you different instructions): Start the shower. Use your normal soap and shampoo to wash your face and hair. Turn off the shower or move out of the shower stream. Pour the CHG onto a clean washcloth. Do not use any type of brush or rough-edged sponge. Starting at your neck, lather your body down to your toes. Make sure you follow these instructions: If you will be having surgery, pay special attention to the part of your body where you will be having surgery. Scrub this area for at least 1 minute. Do not use CHG on your head or face. If the solution gets into  your ears or eyes, rinse them well with  water. Avoid your genital area. Avoid any areas of skin that have broken skin, cuts, or scrapes. Scrub your back and under your arms. Make sure to wash skin folds. Let the lather sit on your skin for 1-2 minutes or as long as told by your health care provider. Thoroughly rinse your entire body in the shower. Make sure that all body creases and crevices are rinsed well. Dry off with a clean towel. Do not put any substances on your body afterward--such as powder, lotion, or perfume--unless you are told to do so by your health care provider. Only use lotions that are recommended by the manufacturer. Put on clean clothes or pajamas. If it is the night before your surgery, sleep in clean sheets.  During a sponge bath Follow these steps when using CHG solution during a sponge bath (unless your health care provider gives you different instructions): Use your normal soap and shampoo to wash your face and hair. Pour the CHG onto a clean washcloth. Starting at your neck, lather your body down to your toes. Make sure you follow these instructions: If you will be having surgery, pay special attention to the part of your body where you will be having surgery. Scrub this area for at least 1 minute. Do not use CHG on your head or face. If the solution gets into your ears or eyes, rinse them well with water. Avoid your genital area. Avoid any areas of skin that have broken skin, cuts, or scrapes. Scrub your back and under your arms. Make sure to wash skin folds. Let the lather sit on your skin for 1-2 minutes or as long as told by your health care provider. Using a different clean, wet washcloth, thoroughly rinse your entire body. Make sure that all body creases and crevices are rinsed well. Dry off with a clean towel. Do not put any substances on your body afterward--such as powder, lotion, or perfume--unless you are told to do so by your health care provider. Only use lotions that are recommended by the  manufacturer. Put on clean clothes or pajamas. If it is the night before your surgery, sleep in clean sheets. How to use CHG prepackaged cloths Only use CHG cloths as told by your health care provider, and follow the instructions on the label. Use the CHG cloth on clean, dry skin. Do not use the CHG cloth on your head or face unless your health care provider tells you to. When washing with the CHG cloth: Avoid your genital area. Avoid any areas of skin that have broken skin, cuts, or scrapes. Before surgery Follow these steps when using a CHG cloth to clean before surgery (unless your health care provider gives you different instructions): Using the CHG cloth, vigorously scrub the part of your body where you will be having surgery. Scrub using a back-and-forth motion for 3 minutes. The area on your body should be completely wet with CHG when you are done scrubbing. Do not rinse. Discard the cloth and let the area air-dry. Do not put any substances on the area afterward, such as powder, lotion, or perfume. Put on clean clothes or pajamas. If it is the night before your surgery, sleep in clean sheets.  For general bathing Follow these steps when using CHG cloths for general bathing (unless your health care provider gives you different instructions). Use a separate CHG cloth for each area of your body. Make sure you wash between any  folds of skin and between your fingers and toes. Wash your body in the following order, switching to a new cloth after each step: The front of your neck, shoulders, and chest. Both of your arms, under your arms, and your hands. Your stomach and groin area, avoiding the genitals. Your right leg and foot. Your left leg and foot. The back of your neck, your back, and your buttocks. Do not rinse. Discard the cloth and let the area air-dry. Do not put any substances on your body afterward--such as powder, lotion, or perfume--unless you are told to do so by your health  care provider. Only use lotions that are recommended by the manufacturer. Put on clean clothes or pajamas. Contact a health care provider if: Your skin gets irritated after scrubbing. You have questions about using your solution or cloth. You swallow any chlorhexidine. Call your local poison control center ([phone] in the [place] Get help right away if: Your eyes itch badly, or they become very red or swollen. Your skin itches badly and is red or swollen. Your hearing changes. You have trouble seeing. You have swelling or tingling in your mouth or throat. You have trouble breathing. These symptoms may represent a serious problem that is an emergency. Do not wait to see if the symptoms will go away. Get medical help right away. Call your local emergency services (911 in the [place] Do not drive yourself to the hospital. Summary Chlorhexidine gluconate (CHG) is a germ-killing (antiseptic) solution that is used to clean the skin. Cleaning your skin with CHG may help to lower your risk for infection. You may be given CHG to use for bathing. It may be in a bottle or in a prepackaged cloth to use on your skin. Carefully follow your health care provider's instructions and the instructions on the product label. Do not use CHG if you have a chlorhexidine allergy. Contact your health care provider if your skin gets irritated after scrubbing. This information is not intended to replace advice given to you by your health care provider. Make sure you discuss any questions you have with your health care provider. Document Revised: 02/22/2022 Document Reviewed: 01/05/2021 Elsevier Patient Education  2023 [employer].

## 2023-06-08 ENCOUNTER — Other Ambulatory Visit: Payer: Self-pay | Admitting: Family Medicine

## 2023-06-08 DIAGNOSIS — F17210 Nicotine dependence, cigarettes, uncomplicated: Secondary | ICD-10-CM | POA: Diagnosis not present

## 2023-06-08 DIAGNOSIS — E1159 Type 2 diabetes mellitus with other circulatory complications: Secondary | ICD-10-CM | POA: Diagnosis not present

## 2023-06-08 DIAGNOSIS — Z794 Long term (current) use of insulin: Secondary | ICD-10-CM | POA: Diagnosis not present

## 2023-06-08 DIAGNOSIS — I10 Essential (primary) hypertension: Secondary | ICD-10-CM

## 2023-06-08 DIAGNOSIS — E559 Vitamin D deficiency, unspecified: Secondary | ICD-10-CM

## 2023-06-08 NOTE — Telephone Encounter (Signed)
No, I wanted her to "THEN TAKE OVER THE COUNTER VITAMIN D 800 UNITS DAILY. "

## 2023-06-08 NOTE — Telephone Encounter (Signed)
Talked w/ PCP's nurse last discussion during visit was that pt was going to check with [employer] to see how much they were. There are not any places that are going to pay for them with her insurances.

## 2023-06-08 NOTE — Telephone Encounter (Signed)
Patient seen 05/24/23.  Does she need to continue prescription strength Vitamin D or start OTC?

## 2023-06-09 ENCOUNTER — Encounter (HOSPITAL_COMMUNITY): Payer: Self-pay

## 2023-06-09 ENCOUNTER — Encounter (HOSPITAL_COMMUNITY)
Admission: RE | Admit: 2023-06-09 | Discharge: 2023-06-09 | Disposition: A | Discharge status: Home / Self Care | Payer: PPO | Source: Ambulatory Visit | Attending: Orthopedic Surgery | Admitting: Orthopedic Surgery

## 2023-06-09 VITALS — BP 115/50 | HR 64 | Temp 98.1°F | Resp 18 | Ht 62.0 in | Wt 289.0 lb

## 2023-06-09 DIAGNOSIS — Z794 Long term (current) use of insulin: Secondary | ICD-10-CM | POA: Diagnosis not present

## 2023-06-09 DIAGNOSIS — E1142 Type 2 diabetes mellitus with diabetic polyneuropathy: Secondary | ICD-10-CM | POA: Diagnosis not present

## 2023-06-09 DIAGNOSIS — Z01818 Encounter for other preprocedural examination: Secondary | ICD-10-CM | POA: Diagnosis present

## 2023-06-09 DIAGNOSIS — G5602 Carpal tunnel syndrome, left upper limb: Secondary | ICD-10-CM | POA: Diagnosis not present

## 2023-06-09 DIAGNOSIS — Z01812 Encounter for preprocedural laboratory examination: Secondary | ICD-10-CM | POA: Insufficient documentation

## 2023-06-09 HISTORY — DX: Chronic obstructive pulmonary disease, unspecified: J44.9

## 2023-06-09 HISTORY — DX: Depression, unspecified: F32.A

## 2023-06-09 HISTORY — DX: Dyspnea, unspecified: R06.00

## 2023-06-09 LAB — BASIC METABOLIC PANEL
Anion gap: 9 (ref 5–15)
BUN: 23 mg/dL (ref 8–23)
CO2: 25 mmol/L (ref 22–32)
Calcium: 8.7 mg/dL — ABNORMAL LOW (ref 8.9–10.3)
Chloride: 100 mmol/L (ref 98–111)
Creatinine, Ser: 1.47 mg/dL — ABNORMAL HIGH (ref 0.44–1.00)
GFR, Estimated: 40 mL/min — ABNORMAL LOW (ref >60)
Glucose, Bld: 109 mg/dL — ABNORMAL HIGH (ref 70–99)
Potassium: 3.9 mmol/L (ref 3.5–5.1)
Sodium: 134 mmol/L — ABNORMAL LOW (ref 135–145)

## 2023-06-09 LAB — CBC
HCT: 39.6 % (ref 36.0–46.0)
Hemoglobin: 13 g/dL (ref 12.0–15.0)
MCH: 31.6 pg (ref 26.0–34.0)
MCHC: 32.8 g/dL (ref 30.0–36.0)
MCV: 96.1 fL (ref 80.0–100.0)
Platelets: 152 10*3/uL (ref 150–400)
RBC: 4.12 MIL/uL (ref 3.87–5.11)
RDW: 15.8 % — ABNORMAL HIGH (ref 11.5–15.5)
WBC: 7.9 10*3/uL (ref 4.0–10.5)
nRBC: 0 % (ref 0.0–0.2)

## 2023-06-09 LAB — HEMOGLOBIN A1C
Hgb A1c MFr Bld: 7.9 % — ABNORMAL HIGH (ref 4.8–5.6)
Mean Plasma Glucose: 180.03 mg/dL

## 2023-06-11 ENCOUNTER — Other Ambulatory Visit: Payer: Self-pay | Admitting: Family Medicine

## 2023-06-11 DIAGNOSIS — E559 Vitamin D deficiency, unspecified: Secondary | ICD-10-CM

## 2023-06-13 ENCOUNTER — Ambulatory Visit (HOSPITAL_COMMUNITY): Payer: PPO | Admitting: Certified Registered"

## 2023-06-13 ENCOUNTER — Encounter (HOSPITAL_COMMUNITY)
Admission: RE | Disposition: A | Discharge status: Home / Self Care | Payer: Self-pay | Source: Ambulatory Visit | Attending: Orthopedic Surgery

## 2023-06-13 ENCOUNTER — Ambulatory Visit (HOSPITAL_COMMUNITY)
Admission: RE | Admit: 2023-06-13 | Discharge: 2023-06-13 | Disposition: A | Discharge status: Home / Self Care | Payer: PPO | Source: Ambulatory Visit | Attending: Orthopedic Surgery | Admitting: Orthopedic Surgery

## 2023-06-13 ENCOUNTER — Encounter (HOSPITAL_COMMUNITY): Payer: Self-pay | Admitting: Orthopedic Surgery

## 2023-06-13 ENCOUNTER — Ambulatory Visit (HOSPITAL_BASED_OUTPATIENT_CLINIC_OR_DEPARTMENT_OTHER): Payer: PPO | Admitting: Certified Registered"

## 2023-06-13 DIAGNOSIS — E119 Type 2 diabetes mellitus without complications: Secondary | ICD-10-CM | POA: Insufficient documentation

## 2023-06-13 DIAGNOSIS — I10 Essential (primary) hypertension: Secondary | ICD-10-CM | POA: Diagnosis not present

## 2023-06-13 DIAGNOSIS — Z955 Presence of coronary angioplasty implant and graft: Secondary | ICD-10-CM | POA: Insufficient documentation

## 2023-06-13 DIAGNOSIS — G4730 Sleep apnea, unspecified: Secondary | ICD-10-CM | POA: Diagnosis not present

## 2023-06-13 DIAGNOSIS — G5602 Carpal tunnel syndrome, left upper limb: Secondary | ICD-10-CM | POA: Insufficient documentation

## 2023-06-13 DIAGNOSIS — F17210 Nicotine dependence, cigarettes, uncomplicated: Secondary | ICD-10-CM

## 2023-06-13 DIAGNOSIS — I2510 Atherosclerotic heart disease of native coronary artery without angina pectoris: Secondary | ICD-10-CM

## 2023-06-13 DIAGNOSIS — Z01818 Encounter for other preprocedural examination: Secondary | ICD-10-CM

## 2023-06-13 HISTORY — PX: CARPAL TUNNEL RELEASE: SHX101

## 2023-06-13 LAB — GLUCOSE, CAPILLARY
Glucose-Capillary: 167 mg/dL — ABNORMAL HIGH (ref 70–99)
Glucose-Capillary: 136 mg/dL — ABNORMAL HIGH (ref 70–99)

## 2023-06-13 SURGERY — CARPAL TUNNEL RELEASE
Anesthesia: Regional | Site: Hand | Laterality: Left

## 2023-06-13 MED ORDER — CEFAZOLIN IN SODIUM CHLORIDE 3-0.9 GM/100ML-% IV SOLN
3.0000 g | INTRAVENOUS | Status: AC
  Administered 2023-06-13: 3 g via INTRAVENOUS
  Filled 2023-06-13: qty 100

## 2023-06-13 MED ORDER — HYDROCODONE-ACETAMINOPHEN 5-325 MG PO TABS: 1.0000 | ORAL_TABLET | Freq: Four times a day (QID) | ORAL | 0 refills | Status: AC | PRN

## 2023-06-13 MED ORDER — METOCLOPRAMIDE HCL 5 MG/ML IJ SOLN
INTRAMUSCULAR | Status: AC
  Filled 2023-06-13: qty 2

## 2023-06-13 MED ORDER — PROPOFOL 500 MG/50ML IV EMUL
INTRAVENOUS | Status: AC
  Filled 2023-06-13: qty 50

## 2023-06-13 MED ORDER — LACTATED RINGERS IV SOLN: INTRAVENOUS | Status: DC

## 2023-06-13 MED ORDER — DEXMEDETOMIDINE HCL IN NACL 80 MCG/20ML IV SOLN
INTRAVENOUS | Status: DC | PRN
  Administered 2023-06-13: 12 ug via INTRAVENOUS

## 2023-06-13 MED ORDER — PROPOFOL 10 MG/ML IV BOLUS
INTRAVENOUS | Status: AC
  Filled 2023-06-13: qty 20

## 2023-06-13 MED ORDER — DEXMEDETOMIDINE HCL IN NACL 80 MCG/20ML IV SOLN
INTRAVENOUS | Status: AC
  Filled 2023-06-13: qty 20

## 2023-06-13 MED ORDER — ONDANSETRON HCL 4 MG/2ML IJ SOLN
INTRAMUSCULAR | Status: DC | PRN
  Administered 2023-06-13: 4 mg via INTRAVENOUS

## 2023-06-13 MED ORDER — PROPOFOL 10 MG/ML IV BOLUS
INTRAVENOUS | Status: DC | PRN
Start: 2023-06-13 — End: 2023-06-13
  Administered 2023-06-13: 30 mg via INTRAVENOUS

## 2023-06-13 MED ORDER — IPRATROPIUM-ALBUTEROL 0.5-2.5 (3) MG/3ML IN SOLN
3.0000 mL | Freq: Once | RESPIRATORY_TRACT | Status: AC
  Administered 2023-06-13: 3 mL via RESPIRATORY_TRACT

## 2023-06-13 MED ORDER — FENTANYL CITRATE (PF) 100 MCG/2ML IJ SOLN
INTRAMUSCULAR | Status: AC
  Filled 2023-06-13: qty 2

## 2023-06-13 MED ORDER — IPRATROPIUM-ALBUTEROL 0.5-2.5 (3) MG/3ML IN SOLN
3.0000 mL | RESPIRATORY_TRACT | Status: DC
Start: 2023-06-13 — End: 2023-06-13

## 2023-06-13 MED ORDER — BUPIVACAINE HCL (PF) 0.5 % IJ SOLN
INTRAMUSCULAR | Status: AC
  Filled 2023-06-13: qty 30

## 2023-06-13 MED ORDER — BUPIVACAINE HCL (PF) 0.5 % IJ SOLN
INTRAMUSCULAR | Status: DC | PRN
  Administered 2023-06-13: 10 mL

## 2023-06-13 MED ORDER — 0.9 % SODIUM CHLORIDE (POUR BTL) OPTIME
TOPICAL | Status: DC | PRN
  Administered 2023-06-13: 1000 mL

## 2023-06-13 MED ORDER — HYDROMORPHONE HCL 1 MG/ML IJ SOLN: 0.2500 mg | INTRAMUSCULAR | Status: DC | PRN

## 2023-06-13 MED ORDER — PROPOFOL 500 MG/50ML IV EMUL
INTRAVENOUS | Status: DC | PRN
  Administered 2023-06-13: 85 ug/kg/min via INTRAVENOUS

## 2023-06-13 MED ORDER — ONDANSETRON HCL 4 MG/2ML IJ SOLN: 4.0000 mg | Freq: Once | INTRAMUSCULAR | Status: DC | PRN

## 2023-06-13 MED ORDER — LIDOCAINE HCL (PF) 0.5 % IJ SOLN
INTRAMUSCULAR | Status: AC
  Filled 2023-06-13: qty 50

## 2023-06-13 MED ORDER — ORAL CARE MOUTH RINSE: 15.0000 mL | Freq: Once | OROMUCOSAL | Status: AC

## 2023-06-13 MED ORDER — CHLORHEXIDINE GLUCONATE 0.12 % MT SOLN
15.0000 mL | Freq: Once | OROMUCOSAL | Status: AC
  Administered 2023-06-13: 15 mL via OROMUCOSAL
  Filled 2023-06-13: qty 15

## 2023-06-13 MED ORDER — FENTANYL CITRATE (PF) 100 MCG/2ML IJ SOLN
INTRAMUSCULAR | Status: DC | PRN
  Administered 2023-06-13: 50 ug via INTRAVENOUS
  Administered 2023-06-13 (×2): 25 ug via INTRAVENOUS

## 2023-06-13 MED ORDER — MIDAZOLAM HCL 2 MG/2ML IJ SOLN
INTRAMUSCULAR | Status: DC | PRN
  Administered 2023-06-13: 2 mg via INTRAVENOUS

## 2023-06-13 MED ORDER — LIDOCAINE HCL (PF) 2 % IJ SOLN
INTRAMUSCULAR | Status: AC
  Filled 2023-06-13: qty 5

## 2023-06-13 MED ORDER — METOCLOPRAMIDE HCL 5 MG/ML IJ SOLN
10.0000 mg | Freq: Once | INTRAMUSCULAR | Status: AC
  Administered 2023-06-13: 10 mg via INTRAVENOUS

## 2023-06-13 MED ORDER — ONDANSETRON HCL 4 MG/2ML IJ SOLN
INTRAMUSCULAR | Status: AC
  Filled 2023-06-13: qty 2

## 2023-06-13 MED ORDER — MIDAZOLAM HCL 2 MG/2ML IJ SOLN
INTRAMUSCULAR | Status: AC
  Filled 2023-06-13: qty 2

## 2023-06-13 MED ORDER — IPRATROPIUM-ALBUTEROL 0.5-2.5 (3) MG/3ML IN SOLN
RESPIRATORY_TRACT | Status: AC
  Filled 2023-06-13: qty 3

## 2023-06-13 SURGICAL SUPPLY — 38 items
APL PRP STRL LF DISP 70% ISPRP (MISCELLANEOUS) ×1
BANDAGE ESMARK 4X12 BL STRL LF (DISPOSABLE) ×1 IMPLANT
BLADE SURG 15 STRL LF DISP TIS (BLADE) ×1 IMPLANT
BLADE SURG 15 STRL SS (BLADE) ×2
BNDG CMPR 12X4 ELC STRL LF (DISPOSABLE) ×1
BNDG CMPR STD VLCR NS LF 5.8X3 (GAUZE/BANDAGES/DRESSINGS) ×1
BNDG ELASTIC 3X5.8 VLCR NS LF (GAUZE/BANDAGES/DRESSINGS) ×1 IMPLANT
BNDG ESMARK 4X12 BLUE STRL LF (DISPOSABLE) ×1
BNDG GAUZE DERMACEA FLUFF 4 (GAUZE/BANDAGES/DRESSINGS) IMPLANT
BNDG GZE DERMACEA 4 6PLY (GAUZE/BANDAGES/DRESSINGS) ×1
CHLORAPREP W/TINT 26 (MISCELLANEOUS) ×1 IMPLANT
CLOTH BEACON ORANGE TIMEOUT ST (SAFETY) ×1 IMPLANT
CORD BIPOLAR FORCEPS 12FT (ELECTRODE) ×1 IMPLANT
COVER LIGHT HANDLE STERIS (MISCELLANEOUS) ×2 IMPLANT
CUFF TOURN SGL QUICK 18X4 (TOURNIQUET CUFF) ×1 IMPLANT
DRAPE HALF SHEET 40X57 (DRAPES) ×1 IMPLANT
GAUZE SPONGE 4X4 12PLY STRL (GAUZE/BANDAGES/DRESSINGS) ×1 IMPLANT
GAUZE XEROFORM 1X8 LF (GAUZE/BANDAGES/DRESSINGS) ×1 IMPLANT
GLOVE BIO SURGEON STRL SZ8 (GLOVE) ×3 IMPLANT
GLOVE BIOGEL PI IND STRL 7.0 (GLOVE) ×2 IMPLANT
GLOVE SRG 8 PF TXTR STRL LF DI (GLOVE) ×1 IMPLANT
GLOVE SURG UNDER POLY LF SZ8 (GLOVE) ×1
GOWN STRL REUS W/ TWL XL LVL3 (GOWN DISPOSABLE) ×1 IMPLANT
GOWN STRL REUS W/TWL LRG LVL3 (GOWN DISPOSABLE) ×1 IMPLANT
GOWN STRL REUS W/TWL XL LVL3 (GOWN DISPOSABLE) ×1
KIT TURNOVER KIT A (KITS) ×1 IMPLANT
MANIFOLD NEPTUNE II (INSTRUMENTS) ×1 IMPLANT
NDL HYPO 21X1.5 SAFETY (NEEDLE) ×1 IMPLANT
NEEDLE HYPO 21X1.5 SAFETY (NEEDLE) ×1 IMPLANT
NS IRRIG 1000ML POUR BTL (IV SOLUTION) ×1 IMPLANT
PACK BASIC LIMB (CUSTOM PROCEDURE TRAY) ×1 IMPLANT
PAD ARMBOARD 7.5X6 YLW CONV (MISCELLANEOUS) ×1 IMPLANT
POSITIONER HAND ALUMI XLG (MISCELLANEOUS) ×1 IMPLANT
POSITIONER HEAD 8X9X4 ADT (SOFTGOODS) ×1 IMPLANT
SET BASIN LINEN APH (SET/KITS/TRAYS/PACK) ×1 IMPLANT
SUT PROLENE NAB BLUE 3-0 30IN (SUTURE) IMPLANT
SYR CONTROL 10ML LL (SYRINGE) ×1 IMPLANT
UNDERPAD 30X36 HEAVY ABSORB (UNDERPADS AND DIAPERS) ×1 IMPLANT

## 2023-06-13 NOTE — Anesthesia Procedure Notes (Signed)
Anesthesia Regional Block: Bier block (IV Regional)   Pre-Anesthetic Checklist: , timeout performed,  Correct Patient, Correct Site, Correct Laterality,  Correct Procedure, Correct Position, site marked,  Risks and benefits discussed,  Surgical consent,  Pre-op evaluation,  At surgeon's request and post-op pain management  Laterality: Left         Needles:  Injection technique: Single-shot      Additional Needles:   Procedures:,,,,, intact distal pulses, Esmarch exsanguination,  Single tourniquet utilized,  #20gu IV placed    Narrative:  Start time: 06/13/2023 7:37 AM End time: 06/13/2023 7:40 AM Injection made incrementally with aspirations every 40 mL.  Performed by: Personally  Anesthesiologist: Molli Barrows, MD CRNA: Oletha Cruel, CRNA  Additional Notes: 0.5% Lidocaine Methylparaben Free- 40 ml administered. Patient tolerated procedure well. No apparent anesthetic complications.

## 2023-06-13 NOTE — H&P (Signed)
Below is the most recent clinic note for [name]; any pertinent information regarding their recent medical history will be updated on the day of surgery.   Orthopaedic Clinic Return  Assessment: [name] is a 62 y.o. female with the following: Left carpal tunnel syndrome   Plan: [name] returns to clinic today after having EMGs completed on her left arm.  She has severe left carpal tunnel syndrome.  She continues to have a lot of shooting, burning pains, numbness and tingling.  She is interested in surgery.  The procedure was discussed in great detail.  All questions were answered.  She does have a history of diabetes, and her hemoglobin appears to be very well-controlled.  She also has a cardiac stent, and will need clearance from cardiology.  Request has been placed to proceed with surgery.  Will work to obtain medical clearance.  Once this is finalized, we can schedule surgery.  Risks and benefits of the procedure were discussed in great detail.  Risks including infection, bleeding, damage to surrounding structures including the median nerve, persistent symptoms, recurrence of symptoms and more severe complications were discussed.  All questions were answered.  Follow-up: No follow-ups on file.   Subjective:  No chief complaint on file.   History of Present Illness: [name] is a 62 y.o. female who returns to clinic for repeat evaluation of left hand pain.  She has shooting pains, burning, numbness and tingling in the left hand.  Symptoms consistent with carpal tunnel syndrome.  She has obtained EMGs, and is here to discuss the results.  She wears a glove and a brace on the left wrist at all time.  She states this helps.  She is using topical treatments, taking Tylenol.  She continues to have severe symptoms.  Review of Systems: No fevers or chills + numbness or tingling No chest pain No shortness of breath No bowel or bladder dysfunction No GI distress No  headaches   Objective: BP 100/84 (BP Location: Right Arm)   Pulse 87   Temp 98.2 F (36.8 C)   Resp 18   SpO2 97%   Physical Exam:  Alert and oriented.  No acute distress.  Left hand without deformity.  No redness.  There is atrophy of the thenar eminence.  Decree sensation in the median nerve distribution.  Positive Tinel's.  Positive Phalen's.  Positive Durkan's.  IMAGING: I personally ordered and reviewed the following images:   EMG demonstrates severe compression of the median nerve at the carpal tunnel.  Oliver Barre, MD 06/13/2023 7:20 AM

## 2023-06-13 NOTE — Transfer of Care (Signed)
Immediate Anesthesia Transfer of Care Note  Patient: BRAZIL PECORELLA  Procedure(s) Performed: LEFT CARPAL TUNNEL RELEASE (Left: Hand)  Patient Location: PACU  Anesthesia Type:Regional  Level of Consciousness: awake, alert , and patient cooperative  Airway & Oxygen Therapy: Patient Spontanous Breathing and Patient connected to face mask oxygen  Post-op Assessment: Report given to RN and Post -op Vital signs reviewed and stable  Post vital signs: Reviewed and stable  Last Vitals:  Vitals Value Taken Time  BP 89/39 06/13/23 0830  Temp 98.1 06/13/23   0830  Pulse 72 06/13/23 0837  Resp 14 06/13/23 0837  SpO2 94 % 06/13/23 0837  Vitals shown include unfiled device data.  Last Pain:  Vitals:   06/13/23 0705  PainSc: 0-No pain     Bier Block  Complications: No notable events documented.

## 2023-06-13 NOTE — Anesthesia Postprocedure Evaluation (Signed)
Anesthesia Post Note  Patient: [name]  Procedure(s) Performed: LEFT CARPAL TUNNEL RELEASE (Left: Hand)  Patient location during evaluation: PACU Anesthesia Type: Bier Block Level of consciousness: awake and alert and oriented Pain management: pain level controlled Vital Signs Assessment: post-procedure vital signs reviewed and stable Respiratory status: spontaneous breathing, nonlabored ventilation, respiratory function stable and patient connected to nasal cannula oxygen Cardiovascular status: blood pressure returned to baseline and stable Postop Assessment: no apparent nausea or vomiting Anesthetic complications: no  No notable events documented.   Last Vitals:  Vitals:   06/13/23 0856 06/13/23 0900  BP:    Pulse: 74 73  Resp: (!) 21 15  Temp:    SpO2: 91% 95%    Last Pain:  Vitals:   06/13/23 0830  PainSc: 0-No pain                  C 

## 2023-06-13 NOTE — Op Note (Signed)
Orthopaedic Surgery Operative Note (CSN: [id number])  NONI PULLEY  [birth year] Date of Surgery: 06/13/2023   Diagnoses:  LEFT CARPAL TUNNEL SYNDROME  Procedure: Left Open Carpal Tunnel Release   Operative Finding Successful completion of the planned procedure.     Post-Op Diagnosis: Same Surgeons:Primary: Oliver Barre, MD Assistants: None Location: [facility] OR ROOM 4 Anesthesia: Sedation plus regional anesthesia Antibiotics: Ancef 3 g Tourniquet time:  Total Tourniquet Time Documented: Forearm (Left) - 43 minutes Total: Forearm (Left) - 43 minutes  Estimated Blood Loss: 5 cc Complications: None Specimens: None Implants: None  Indications for Surgery:   KALEYAH LYKES is a 62 y.o. female with symptoms consistent with carpal tunnel syndrome.  Symptoms have been ongoing, and progressively worsening.  They have tried medications and bracing without improvement in symptoms.  EMG results demonstrate severe left carpal tunnel syndrome.  Risks and benefits of operative and nonoperative management were discussed prior to surgery with the patient and informed consent form was completed.  Specific risks including infection, need for additional surgery, bleeding, recurrent symptoms, incomplete resolution of symptoms, persistent pain and more severe complications associated with anesthesia were discussed.  All questions were answered.  Surgical consent was finalized.    Procedure:   The patient was identified properly. Informed consent was finalized and the surgical site was marked. The patient was taken to the OR where sedation and regional anesthesia was induced.  The patient was positioned supine, on a hand table.  The left arm was prepped and draped in the usual sterile fashion.  Timeout was performed before the beginning of the case.  Tourniquet was used for the above duration.  Antibiotics were administered prior to making incision.  Incision was made in line with the radial border of the  ring finger. The carpal tunnel transverse fascia was identified, cleaned, and incised sharply. The common sensory branches were visualized along with the superficial palmar arch and protected.  The median nerve was protected below. Deep retractors were placed underneath the transverse carpal ligament, protecting the nerve. I released the ligament completely, and then released the distal volar forearm fascia. The nerve was identified, and visualized, and protected throughout the case. No masses or abnormalities were identified in ulnar bursa.  The wounds were irrigated copiously, and the wounds injected. Skin closed with interrupted sutures.  The incision was injected with marcaine followed by a bulky dressing. Patient tolerated this well, with no complications.   Post-operative plan:  The patient will be discharged home from the PACU. WBAT on the operative extremity; limit lifting to nothing more than a coffee cup until follow up appointment   DVT prophylaxis not indicated in this ambulatory upper extremity patient without significant risk factors.     She will resume her aspirin Pain control with PRN pain medication preferring oral medicines.   Follow up plan will be scheduled in approximately 7-10 days for incision check

## 2023-06-13 NOTE — Discharge Instructions (Signed)
°   A. , MD MS [facility] [address] ,  [place]  [address] Phone: [phone] Fax: [phone]    POST-OPERATIVE INSTRUCTIONS   WOUND CARE You may remove your bandage on postop day 3 and get the hand wet.  No ointments or lotions to be applied to the incision.  Do not submerge the incision for 1 month.  FOLLOW-UP If you develop a Fever (>101.5), Redness or Drainage from the surgical incision site, please call our office to arrange for an evaluation. Please call the office to schedule a follow-up appointment for your incision check if you do not already have one, 7-10 days post-operatively.  IF YOU HAVE ANY QUESTIONS, PLEASE FEEL FREE TO CALL OUR OFFICE.  HELPFUL INFORMATION  You should wean off your narcotic medicines as soon as you are able.  Most patients will be off or using minimal narcotics before their first postop appointment.   You may be more comfortable sleeping in a semi-seated position the first few nights following surgery.  Keep a pillow propped under the elbow and forearm for comfort.  If you have a recliner type of chair it might be beneficial.    We suggest you use the pain medication the first night prior to going to bed, in order to ease any pain when the anesthesia wears off. You should avoid taking pain medications on an empty stomach as it will make you nauseous.  Do not drink alcoholic beverages or take illicit drugs when taking pain medications.  You may return to work/school in the next couple of days when you feel up to it. 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  Pain medication may make you constipated.  Below are a few solutions to try in this order: Decrease the amount of pain medication if you aren't having pain. Drink lots of decaffeinated fluids. Drink prune juice and/or each dried prunes  If the first 3 don't work start with additional solutions Take Colace - an over-the-counter stool softener Take  Senokot - an over-the-counter laxative Take Miralax - a stronger over-the-counter laxative    

## 2023-06-13 NOTE — Progress Notes (Signed)
Instructed on incentive spirometer. 1250 ml obtained. Tolerated well. 

## 2023-06-13 NOTE — Interval H&P Note (Signed)
History and Physical Interval Note:  06/13/2023 7:21 AM  [name]  has presented today for surgery, with the diagnosis of LEFT CARPAL TUNNEL SYNDROME.  The various methods of treatment have been discussed with the patient and family. After consideration of risks, benefits and other options for treatment, the patient has consented to  Procedure(s): LEFT CARPAL TUNNEL RELEASE (Left) as a surgical intervention.  The patient's history has been reviewed, patient examined, no change in status, stable for surgery.  I have reviewed the patient's chart and labs.  Questions were answered to the patient's satisfaction.     Oliver Barre

## 2023-06-13 NOTE — Anesthesia Preprocedure Evaluation (Signed)
Anesthesia Evaluation  Patient identified by MRN, date of birth, ID band Patient awake    Reviewed: Allergy & Precautions, H&P , NPO status , Patient's Chart, lab work & pertinent test results, reviewed documented beta blocker date and time   Airway Mallampati: II  TM Distance: >3 FB Neck ROM: Full    Dental  (+) Missing, Dental Advisory Given   Pulmonary shortness of breath and with exertion, sleep apnea and Continuous Positive Airway Pressure Ventilation , COPD,  COPD inhaler, Current Smoker   Pulmonary exam normal breath sounds clear to auscultation       Cardiovascular Exercise Tolerance: Poor hypertension, Pt. on medications and Pt. on home beta blockers + angina  + CAD, + Cardiac Stents and + DOE  Normal cardiovascular exam Rhythm:Regular Rate:Normal   2nd Mrg lesion is 30% stenosed.    Codominant right coronary with moderate diffuse atherosclerosis.  No high-grade obstructive disease.  Widely patent left main.  LAD gives origin to one large diagonal.  Distal to the bifurcation with the diagonal the LAD contains eccentric 40 to 50% narrowing within a region of calcification.  The ostium of the diagonal is 25% narrowed.  No high-grade focal stenosis is noted in the LAD or diagonal territory.  Circumflex is codominant.  The obtuse marginal in the large second diagonal contains diffuse in-stent restenosis which narrows the lumen by 30%.  No high-grade obstruction is noted in the circumflex territory.  Normal left ventricular function with upper normal LV EDP of 18 mmHg.  EF 60% or greater.   RECOMMENDATIONS:    Continued patency of the bare-metal stent in the obtuse marginal.  No high-grade obstructive disease that can be implicated as a source/cause of the patient's presenting symptoms.\  Further management and work-up as directed by the primary tea    Neuro/Psych  Headaches PSYCHIATRIC DISORDERS Anxiety Depression        GI/Hepatic Neg liver ROS,GERD  Medicated and Controlled,,  Endo/Other  diabetes (last dose of semaglutide - 06/04/23), Well Controlled, Type 2, Oral Hypoglycemic Agents, Insulin Dependent  Morbid obesity  Renal/GU Renal InsufficiencyRenal disease  negative genitourinary   Musculoskeletal  (+) Arthritis , Osteoarthritis,    Abdominal   Peds negative pediatric ROS (+)  Hematology negative hematology ROS (+)   Anesthesia Other Findings   Reproductive/Obstetrics negative OB ROS                             Anesthesia Physical Anesthesia Plan  ASA: 3  Anesthesia Plan: Bier Block and Bier Block-Lidocaine Only   Post-op Pain Management: Minimal or no pain anticipated   Induction: Intravenous  PONV Risk Score and Plan: 2 and Ondansetron and Metaclopromide  Airway Management Planned: Nasal Cannula and Natural Airway  Additional Equipment:   Intra-op Plan:   Post-operative Plan:   Informed Consent: I have reviewed the patients History and Physical, chart, labs and discussed the procedure including the risks, benefits and alternatives for the proposed anesthesia with the patient or authorized representative who has indicated his/her understanding and acceptance.     Dental advisory given  Plan Discussed with: Surgeon and CRNA  Anesthesia Plan Comments:         Anesthesia Quick Evaluation

## 2023-06-14 ENCOUNTER — Other Ambulatory Visit: Payer: Self-pay | Admitting: Family Medicine

## 2023-06-14 ENCOUNTER — Encounter: Payer: Self-pay | Admitting: *Deleted

## 2023-06-14 DIAGNOSIS — E559 Vitamin D deficiency, unspecified: Secondary | ICD-10-CM

## 2023-06-17 NOTE — Telephone Encounter (Signed)
Patient wants to know if she needs to continue or if it is optional. If she needs to take, what is the strength?

## 2023-06-20 ENCOUNTER — Telehealth: Payer: Self-pay | Admitting: Orthopedic Surgery

## 2023-06-20 NOTE — Telephone Encounter (Signed)
I called, went to VM.  LMVM advising the below from Dr Dallas Schimke.  Patient is sched for her appt.

## 2023-06-20 NOTE — Telephone Encounter (Signed)
Dr. Dallas Schimke pt - someone called for the patient and schedule a postop appt for 8/14.  I'm not seeing an op note in the chart and the person that called stated that the paperwork says 7-10 days.  She also stated that the patient is experiencing shooting pain in her pinky and ring finger and numbness.  They would like a call back.  [phone]

## 2023-06-21 ENCOUNTER — Other Ambulatory Visit: Payer: Self-pay | Admitting: Pharmacist

## 2023-06-21 ENCOUNTER — Telehealth: Payer: PPO

## 2023-06-21 NOTE — Progress Notes (Signed)
06/21/2023 Name: [name] MRN: [id number] DOB: 1961/06/19  Chief Complaint  Patient presents with   Diabetes    Medication Assistance (Rybelsus)    [name] is a 62 y.o. year old female who presented for a telephone visit.   They were referred to the pharmacist by their PCP for assistance in managing diabetes and medication access.    Subjective:  Care Team: Primary Care Provider: [name], DO  Medication Access/Adherence  Current Pharmacy:  [facility] [address], [place] - [address] [place] [address] Phone: [phone] Fax: [phone]  [facility] - [place], [place] - [address] [facility] [address] [place] [address] [place] [address] Phone: [phone] Fax: [phone]  [facility] - [place], [place] - [address] [place] [address] Phone: [phone] Fax: [phone]  Patient reports affordability concerns with their medications: Yes  Patient reports access/transportation concerns to their pharmacy: No  Patient reports adherence concerns with their medications:  No    Diabetes:   Current medications: Basaglar 40units daily, Rybelsus 14mg , still on glimepiride? D/c Medications tried in the past: metformin, dapagliflozin, sitagliptin, Trulicity, Novolog patient denies personal or family history of medullary thyroid carcinoma (MTC) or in patients with Multiple Endocrine Neoplasia syndrome type 2 (MEN 2)   Current glucose readings: FBG<135, PPBG<180, mostly (can peak to 200 with sweets) Using libre 3 cgm (phone as reader) -She greatly benefits from her libre 3 CGM    Patient denies hypoglycemic s/sx including dizziness, shakiness, sweating. Patient denies hyperglycemic symptoms including polyuria, polydipsia, polyphagia, nocturia, neuropathy, blurred vision.   Current physical activity: difficult to get around;  encouraged as able   Current medication access support:  -BASAGLAR via [employer]; meds ship to pt home; ESCRIBE TO [facility]; approved UNTIL 11/08/23 -RYBELSUS via [employer] patient assistance program; meds ship to PCP office; please send message to [employer], CPhT or Vanice Sarah, PharmD if refills/adjustments are required ALL shipments (new, refill, dose changes can take up to 4-6 weeks)    Objective:  Lab Results  Component Value Date   HGBA1C 7.9 (H) 06/09/2023    Lab Results  Component Value Date   CREATININE 1.47 (H) 06/09/2023   BUN 23 06/09/2023   NA 134 (L) 06/09/2023   K 3.9 06/09/2023   CL 100 06/09/2023   CO2 25 06/09/2023    Lab Results  Component Value Date   CHOL 151 08/09/2022   HDL 38 (L) 08/09/2022   LDLCALC 76 08/09/2022   LDLDIRECT 52 01/12/2022   TRIG 224 (H) 08/09/2022   CHOLHDL 4.0 08/09/2022    Medications Reviewed Today     Reviewed by Danella Maiers, [facility] (Pharmacist) on 06/21/23 at 1218  Med List Status: <None>   Medication Order Taking? Sig Documenting Provider Last Dose Status Informant  acetaminophen (TYLENOL) 325 MG tablet [id number] No Take 975 mg by mouth every 8 (eight) hours as needed. [provider] Taking Active   albuterol (VENTOLIN HFA) 108 (90 Base) MCG/ACT inhaler [id number] No INHALE 2 PUFFS BY MOUTH EVERY 4 HOURS AS NEEDED FOR WHEEZING OR SHORTNESS OF BREATH (FOR  RESCUE) [name], DO Taking Active   Alpha-Lipoic Acid 600 MG CAPS [id number] No Take 1 capsule (600 mg total) by mouth daily. For diabetic neuropathy Delynn Flavin M, DO More than a month Active   aspirin EC 325 MG  tablet [id number] No Take 325 mg by mouth daily. [provider] 06/10/2023 Active   Blood Glucose Monitoring Suppl [facility] VERIO) w/Device KIT [id number] No Use to test blood sugar twice daily as directed; DX: E11.69 [name], DO Taking Active Self  Continuous Glucose Sensor (FREESTYLE LIBRE 3  SENSOR) MISC [id number] No Place 1 sensor on the skin every 14 days. Use to check glucose continuously; DX:E11.65 [name], DO Taking Active   furosemide (LASIX) 40 MG tablet [id number] No TAKE 1 TABLET BY MOUTH ONCE DAILY AS NEEDED FOR EDEMA OR FLUID [name], DO 06/12/2023 Active            Med Note Sharen Hones   Thu Apr 21, 2023  8:41 AM) Reports taking daily   gabapentin (NEURONTIN) 600 MG tablet [id number] No TAKE 1 & 1/2 (ONE & ONE-HALF) TABLETS BY MOUTH THREE TIMES DAILY Delynn Flavin M, DO 06/12/2023 Active   glimepiride (AMARYL) 2 MG tablet [id number] No Take 2 mg by mouth daily. [provider] 06/12/2023 Active   glucose blood (ONETOUCH VERIO) test strip [id number] No Use to test blood sugar twice daily as directed; DX: E11.69 [name], DO Taking Active Self  Insulin Glargine (BASAGLAR KWIKPEN) 100 UNIT/ML [id number] No Inject 40 Units into the skin at bedtime. Delynn Flavin M, DO 06/12/2023 Active   methocarbamol (ROBAXIN) 500 MG tablet [id number] No Take 1 tablet (500 mg total) by mouth every 6 (six) hours as needed for muscle spasms. Delynn Flavin M, DO Past Week Active   metoprolol tartrate (LOPRESSOR) 25 MG tablet [id number] No Take 1 tablet (25 mg total) by mouth 2 (two) times daily. Sharlene Dory, NP 06/12/2023 0800 Active   nitroGLYCERIN (NITROSTAT) 0.4 MG SL tablet [id number] No Place 1 tablet (0.4 mg total) under the tongue every 5 (five) minutes x 3 doses as needed for chest pain (if no relief after 3rd dose, proceed to ED or call 911). Sharlene Dory, NP Taking Active   omeprazole (PRILOSEC) 40 MG capsule [id number] No Take 1 capsule (40 mg total) by mouth daily. For heartburn [name], DO 06/12/2023 Active   OneTouch Delica Lancets 30G MISC [id number] No Use to test blood sugar twice daily as directed; DX: E11.69 [name], DO Taking Active Self  OneTouch Delica Lancets 33G MISC [id number] No Check blood sugar BID and  prn  E11.9 Delynn Flavin M, DO Taking Active Self  oxybutynin (DITROPAN XL) 5 MG 24 hr tablet [id number] No Take 1 tablet (5 mg total) by mouth at bedtime. For bladder [name], DO 06/12/2023 Active   rosuvastatin (CRESTOR) 20 MG tablet [id number] No Take 1 tablet (20 mg total) by mouth at bedtime. [name], DO 06/12/2023 Active   Semaglutide 14 MG TABS [id number] No Take 14 mg by mouth daily. [provider] 06/04/2023 Active            Med Note Cresenciano Genre,  D   Tue Sep 22, 2021  3:08 PM) VIA NOVO [employer] PATIENT ASSISTANCE PROGRAM  sertraline (ZOLOFT) 50 MG tablet [id number] No Take 1 tablet (50 mg total) by mouth daily. Daily for depression/ anxiety [name], DO 06/12/2023 Active   telmisartan (MICARDIS) 40 MG tablet [id number] No Take 1 tablet (40 mg total) by mouth daily. Delynn Flavin M, DO 06/12/2023 Active   Vitamin D, Ergocalciferol, (DRISDOL) 1.25 MG (50000 UNIT) CAPS capsule [id number] No TAKE 1 CAPSULE BY MOUTH ONCE A WEEK, THEN TAKE OTC VITAMIN D 800IU  DAILY Delynn Flavin M, DO 06/12/2023 Active              Assessment/Plan:    -Continue current regimen-Basaglar, Rybelsus, glimepiride; d/c glimepiride at A1c f/u PCP visit -compliant with statin, ARB -continue libre 3 CGM--patient states the sensors keep falling off, however the most recent one has stayed on; sample given; patient to reach out if she wants traditional glucometer    Follow Up Plan: 3 months or as needed per patient     [name], PharmD, BCACP Clinical Pharmacist, [facility]

## 2023-06-22 ENCOUNTER — Ambulatory Visit (INDEPENDENT_AMBULATORY_CARE_PROVIDER_SITE_OTHER): Payer: PPO | Admitting: Orthopedic Surgery

## 2023-06-22 ENCOUNTER — Encounter: Payer: Self-pay | Admitting: Orthopedic Surgery

## 2023-06-22 DIAGNOSIS — G5602 Carpal tunnel syndrome, left upper limb: Secondary | ICD-10-CM

## 2023-06-22 NOTE — Progress Notes (Signed)
Orthopaedic Postop Note  Assessment: [name] is a 62 y.o. female s/p left open carpal tunnel release  DOS: 06/13/2023  Plan: LUDIE BRUHL has done well.  Notes at least 50% improvement in symptoms.  Denies burning or shooting pains.  Some persistent tingling sensations in the median nerve distribution.  Surgical incision is healing well.  Sutures were removed, and Steri-Strips were placed.  Okay to return to work.  Keep incision covered.  Do not submerge the wound.  Return in 4 weeks.    Follow-up: No follow-ups on file. XR at next visit: None  Subjective:  Chief Complaint  Patient presents with   Routine Post Op    Left CTR DOS 06/13/23    History of Present Illness: [name] is a 62 y.o. female who presents following the above stated procedure.  Surgery was approximately 2 weeks ago.  No issues.  Some pain for the first few days.  Burning and shooting pains have almost completely resolved.  Some numbness in the fingers.   No issues with the incision.  She had some numbness and tingling into the small finger, but this has resolved.   Review of Systems: No fevers or chills Some numbness or tingling No Chest Pain No shortness of breath   Objective: There were no vitals taken for this visit.  Physical Exam:  Alert and oriented, no acute distress.  Surgical incision is healing well.  No surrounding erythema or drainage.  Mild tenderness to palpation about surgical site.  Sensation is slightly decreased in the median nerve distribution.  Able to make a full fist. No redness. 2+ radial pulse.   IMAGING: I personally ordered and reviewed the following images:  No new imaging obtained today  Oliver Barre, MD 06/22/2023 1:31 PM

## 2023-06-28 ENCOUNTER — Telehealth: Payer: Self-pay | Admitting: Family Medicine

## 2023-06-28 ENCOUNTER — Other Ambulatory Visit: Payer: Self-pay | Admitting: Pharmacist

## 2023-06-28 ENCOUNTER — Encounter: Payer: Self-pay | Admitting: *Deleted

## 2023-06-28 ENCOUNTER — Ambulatory Visit: Payer: Self-pay | Admitting: *Deleted

## 2023-06-28 DIAGNOSIS — E1142 Type 2 diabetes mellitus with diabetic polyneuropathy: Secondary | ICD-10-CM

## 2023-06-28 MED ORDER — ONETOUCH VERIO W/DEVICE KIT
PACK | 1 refills | Status: DC
Start: 2023-06-28 — End: 2024-06-01

## 2023-06-28 MED ORDER — ONETOUCH VERIO VI STRP
ORAL_STRIP | 5 refills | Status: DC
Start: 2023-06-28 — End: 2024-06-01

## 2023-06-28 MED ORDER — ONETOUCH DELICA LANCETS 30G MISC
3 refills | Status: DC
Start: 2023-06-28 — End: 2024-06-01

## 2023-06-28 NOTE — Progress Notes (Signed)
   06/28/2023 Name: [name] MRN: [id number] DOB: [birth year]  Chief Complaint  Patient presents with   Diabetes   Patient states her [name] 3 Sensors keep falling off her arm.  Instructed patient to make pharmacy clinic appt so we can adjust the location of her sensor.  She would like to go back to using a traditional glucometer in the meantime.  This would be a good option to have as backup.  Her insurance currently prefers [employer] psychotherapist.  We will send prescriptions to [facility] pharmacy.   Kieth Brightly, PharmD, BCACP Clinical Pharmacist, [facility]
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
## 2023-06-28 NOTE — Telephone Encounter (Signed)
See PharmD note 06/28/23
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
## 2023-07-14 ENCOUNTER — Encounter: Payer: Self-pay | Admitting: *Deleted

## 2023-07-14 ENCOUNTER — Ambulatory Visit: Payer: Self-pay | Admitting: *Deleted

## 2023-07-14 ENCOUNTER — Telehealth: Payer: Self-pay | Admitting: Family Medicine
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
## 2023-07-14 NOTE — Telephone Encounter (Signed)
Spoke with pt about scheduling her Diabetic Eye Exam here at [facility]. Pt confirmed that she has already had her eye exam done at [facility] in [place] around June 2024.
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
## 2023-07-20 ENCOUNTER — Encounter: Payer: PPO | Admitting: Orthopedic Surgery

## 2023-07-22 DIAGNOSIS — N1831 Chronic kidney disease, stage 3a: Secondary | ICD-10-CM | POA: Diagnosis not present

## 2023-07-22 DIAGNOSIS — F17200 Nicotine dependence, unspecified, uncomplicated: Secondary | ICD-10-CM | POA: Diagnosis not present

## 2023-07-22 DIAGNOSIS — I10 Essential (primary) hypertension: Secondary | ICD-10-CM | POA: Diagnosis not present

## 2023-07-22 DIAGNOSIS — G4733 Obstructive sleep apnea (adult) (pediatric): Secondary | ICD-10-CM | POA: Diagnosis not present

## 2023-07-22 DIAGNOSIS — E1122 Type 2 diabetes mellitus with diabetic chronic kidney disease: Secondary | ICD-10-CM | POA: Diagnosis not present

## 2023-07-22 DIAGNOSIS — Z6841 Body Mass Index (BMI) 40.0 and over, adult: Secondary | ICD-10-CM | POA: Diagnosis not present

## 2023-07-22 DIAGNOSIS — I25118 Atherosclerotic heart disease of native coronary artery with other forms of angina pectoris: Secondary | ICD-10-CM | POA: Diagnosis not present

## 2023-07-22 DIAGNOSIS — E559 Vitamin D deficiency, unspecified: Secondary | ICD-10-CM | POA: Diagnosis not present

## 2023-07-22 DIAGNOSIS — Z794 Long term (current) use of insulin: Secondary | ICD-10-CM | POA: Diagnosis not present

## 2023-07-22 DIAGNOSIS — E1142 Type 2 diabetes mellitus with diabetic polyneuropathy: Secondary | ICD-10-CM | POA: Diagnosis not present

## 2023-07-22 DIAGNOSIS — J42 Unspecified chronic bronchitis: Secondary | ICD-10-CM | POA: Diagnosis not present

## 2023-07-26 ENCOUNTER — Encounter: Payer: PPO | Admitting: Orthopedic Surgery

## 2023-08-04 ENCOUNTER — Encounter: Payer: Self-pay | Admitting: *Deleted

## 2023-08-04 ENCOUNTER — Other Ambulatory Visit: Payer: PPO | Admitting: *Deleted

## 2023-08-04 ENCOUNTER — Telehealth: Payer: PPO

## 2023-08-04 NOTE — Patient Instructions (Signed)
Visit Information  Thank you for taking time to visit with me today. Please don't hesitate to contact me if I can be of assistance to you before our next scheduled telephone appointment.  Following are the goals we discussed today:   Goals Addressed             This Visit's Progress    CCM (DIABETES) EXPECTED OUTCOME: MONITOR, SELF-MANAGE AND REDUCE SYMPTOMS OF DIABETES       Current Barriers:  Knowledge Deficits related to Diabetes management Chronic Disease Management support and education needs related to Diabetes, diet Financial Constraints.  Patient reports CBG is monitored with Freestyle Libre with fasting ranges 90-153, random ranges low- mid 100-200's Patient reports she has initial consult for wound care on 04/07/23 due to "a puppy bit me on right leg",  pt is finished with antibiotic, update- pt states she decided not to go to wound center and states " the wound is almost completely healed" Update- pt states the affected leg (puppy bite), is the left leg, not the right leg, states this is healed but she still has slight redness and some swelling, states she is going to make appointment with primary care provider to have checked Patient reports sometimes she only eats one meal per day, depending on how she feels  Planned Interventions: Provided education to patient about basic DM disease process; Reviewed medications with patient and discussed importance of medication adherence;        Counseled on importance of regular laboratory monitoring as prescribed;        Advised patient, providing education and rationale, to check cbg per CGM  and record        Review of patient status, including review of consultants reports, relevant laboratory and other test results, and medications completed;       Advised patient to discuss non healing wounds, signs of infection  with provider;      Reviewed signs/ symptoms of infection, reportable signs/ symptoms Reviewed carbohydrate modified  diet Reviewed importance of eating 3 well balanced meals per day, preferably at the same time each day  Symptom Management: Take medications as prescribed   Attend all scheduled provider appointments Call pharmacy for medication refills 3-7 days in advance of running out of medications Attend church or other social activities Call provider office for new concerns or questions  Work with the social worker to address care coordination needs and will continue to work with the clinical team to address health care and disease management related needs check blood sugar at prescribed times: [name]  check feet daily for cuts, sores or redness enter blood sugar readings and medication or insulin into daily log take the blood sugar log to all doctor visits take the blood sugar meter to all doctor visits trim toenails straight across limit fast food meals to no more than 1 per week manage portion size prepare main meal at home 3 to 5 days each week read food labels for fat, fiber, carbohydrates and portion size set a realistic goal keep feet up while sitting wash and dry feet carefully every day wear comfortable, cotton socks Call your doctor for signs of skin infection/ non healing- redness, foul smelling drainage, warmth, fever, if overall you are not feeling well Try to eat 3 meals per day around the same time each day  Follow Up Plan: Telephone follow up appointment with care management team member scheduled for:  10/13/23 at 215 pm  CCM (HYPERTENSION) EXPECTED OUTCOME: SELF-MANAGE AND REDUCE SYMPTOMS OF HYPERTENSION       Current Barriers:  Knowledge Deficits related to Hypertension management Care Coordination needs related to pharmacy/ social work needs in a patient with Hypertension Chronic Disease Management support 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  No Advanced Directives in place- pt declines Patient reports she lives with spouse, has  cane and walker for ambulation, spouse assists with cooking due to pt cannot stand up for long periods, pt has had no falls, spouse provides transportation Patient reports she is on payment plan for power bill and has difficulty affording food, receives 23$/ month food stamps Patient has blood pressure cuff but does not monitor blood pressure Patient reports she has all medications including albuterol and using as prescribed, pharmacist is working with pt  Planned Interventions: Evaluation of current treatment plan related to hypertension self management and patient's adherence to plan as established by provider;   Reviewed medications with patient and discussed importance of compliance;  Counseled on the importance of exercise goals with target of 150 minutes per week Advised patient, providing education and rationale, to monitor blood pressure daily and record, calling PCP for findings outside established parameters;  Reviewed low sodium diet  Symptom Management: Take medications as prescribed   Attend all scheduled provider appointments Call pharmacy for medication refills 3-7 days in advance of running out of medications Attend church or other social activities Call provider office for new concerns or questions  Work with the social worker to address care coordination needs and will continue to work with the clinical team to address health care and disease management related needs check blood pressure 3 times per week choose a place to take my blood pressure (home, clinic or office, retail store) write blood pressure results in a log or diary learn about high blood pressure keep a blood pressure log keep all doctor appointments take medications for blood pressure exactly as prescribed begin an exercise program report new symptoms to your doctor eat more whole grains, fruits and vegetables, lean meats and healthy fats Follow low sodium diet- read food labels for sodium content Limit/  avoid fast food  Follow Up Plan: Telephone follow up appointment with care management team member scheduled for:   10/13/23 at 215 pm           Our next appointment is by telephone on 10/13/23 at 215 pm  Please call the care guide team at [phone] if you need to cancel or reschedule your appointment.   If you are experiencing a Mental Health or Behavioral Health Crisis or need someone to talk to, please call the Suicide and Crisis Lifeline: 988 call the [place] National Suicide Prevention Lifeline: (470)137-7421 or TTY: 5315314504 TTY 661-864-3269) to talk to a trained counselor call 1-800-273-TALK (toll free, 24 hour hotline) go to [facility] [address] [place] [phone]) call the [facility] Line: [phone] call 911   The patient verbalized understanding of instructions, educational materials, and care plan provided today and DECLINED offer to receive copy of patient instructions, educational materials, and care plan.   Telephone follow up appointment with care management team member scheduled for: 10/13/23 at 215 pm  Irving Shows [facility], BSN [place]/ Ambulatory Care Management [phone]

## 2023-08-04 NOTE — Patient Outreach (Signed)
Care Management   Visit Note  08/04/2023 Name: [name] MRN: [id number] DOB: [birth year]  Subjective: [name] is a 62 y.o. year old female who is a primary care patient of [name], DO. The Care Management team was consulted for assistance.      Engaged with patient spoke with patient by telephone for follow up   Goals Addressed             This Visit's Progress    CCM (DIABETES) EXPECTED OUTCOME: MONITOR, SELF-MANAGE AND REDUCE SYMPTOMS OF DIABETES       Current Barriers:  Knowledge Deficits related to Diabetes management Chronic Disease Management support and education needs related to Diabetes, diet Financial Constraints.  Patient reports CBG is monitored with Freestyle Libre with fasting ranges 90-153, random ranges low- mid 100-200's Patient reports she has initial consult for wound care on 04/07/23 due to "a puppy bit me on right leg",  pt is finished with antibiotic, update- pt states she decided not to go to wound center and states " the wound is almost completely healed" Update- pt states the affected leg (puppy bite), is the left leg, not the right leg, states this is healed but she still has slight redness and some swelling, states she is going to make appointment with primary care provider to have checked Patient reports sometimes she only eats one meal per day, depending on how she feels  Planned Interventions: Provided education to patient about basic DM disease process; Reviewed medications with patient and discussed importance of medication adherence;        Counseled on importance of regular laboratory monitoring as prescribed;        Advised patient, providing education and rationale, to check cbg per CGM  and record        Review of patient status, including review of consultants reports, relevant laboratory and other test results, and medications completed;       Advised patient to discuss non healing wounds, signs of infection  with provider;       Reviewed signs/ symptoms of infection, reportable signs/ symptoms Reviewed carbohydrate modified diet Reviewed importance of eating 3 well balanced meals per day, preferably at the same time each day  Symptom Management: Take medications as prescribed   Attend all scheduled provider appointments Call pharmacy for medication refills 3-7 days in advance of running out of medications Attend church or other social activities Call provider office for new concerns or questions  Work with the social worker to address care coordination needs and will continue to work with the clinical team to address health care and disease management related needs check blood sugar at prescribed times: [name]  check feet daily for cuts, sores or redness enter blood sugar readings and medication or insulin into daily log take the blood sugar log to all doctor visits take the blood sugar meter to all doctor visits trim toenails straight across limit fast food meals to no more than 1 per week manage portion size prepare main meal at home 3 to 5 days each week read food labels for fat, fiber, carbohydrates and portion size set a realistic goal keep feet up while sitting wash and dry feet carefully every day wear comfortable, cotton socks Call your doctor for signs of skin infection/ non healing- redness, foul smelling drainage, warmth, fever, if overall you are not feeling well Try to eat 3 meals per day around the same time each day  Follow Up Plan:  Telephone follow up appointment with care management team member scheduled for:  10/13/23 at 215 pm       CCM (HYPERTENSION) EXPECTED OUTCOME: SELF-MANAGE AND REDUCE SYMPTOMS OF HYPERTENSION       Current Barriers:  Knowledge Deficits related to Hypertension management Care Coordination needs related to pharmacy/ social work needs in a patient with Hypertension Chronic Disease Management support and education needs related to Hypertension Medical laboratory scientific officer.  No Advanced Directives in place- pt declines Patient reports she lives with spouse, has cane and walker for ambulation, spouse assists with cooking due to pt cannot stand up for long periods, pt has had no falls, spouse provides transportation Patient reports she is on payment plan for power bill and has difficulty affording food, receives 23$/ month food stamps Patient has blood pressure cuff but does not monitor blood pressure Patient reports she has all medications including albuterol and using as prescribed, pharmacist is working with pt  Planned Interventions: Evaluation of current treatment plan related to hypertension self management and patient's adherence to plan as established by provider;   Reviewed medications with patient and discussed importance of compliance;  Counseled on the importance of exercise goals with target of 150 minutes per week Advised patient, providing education and rationale, to monitor blood pressure daily and record, calling PCP for findings outside established parameters;  Reviewed low sodium diet  Symptom Management: Take medications as prescribed   Attend all scheduled provider appointments Call pharmacy for medication refills 3-7 days in advance of running out of medications Attend church or other social activities Call provider office for new concerns or questions  Work with the social worker to address care coordination needs and will continue to work with the clinical team to address health care and disease management related needs check blood pressure 3 times per week choose a place to take my blood pressure (home, clinic or office, retail store) write blood pressure results in a log or diary learn about high blood pressure keep a blood pressure log keep all doctor appointments take medications for blood pressure exactly as prescribed begin an exercise program report new symptoms to your doctor eat more whole grains, fruits and  vegetables, lean meats and healthy fats Follow low sodium diet- read food labels for sodium content Limit/ avoid fast food  Follow Up Plan: Telephone follow up appointment with care management team member scheduled for:   10/13/23 at 215 pm           Plan: Telephone follow up appointment with care management team member scheduled for: 10/13/23 at 215 pm  Irving Shows [facility], BSN [place]/ Ambulatory Care Management [phone]

## 2023-09-08 ENCOUNTER — Telehealth: Payer: Self-pay | Admitting: Pharmacist

## 2023-09-08 ENCOUNTER — Ambulatory Visit (INDEPENDENT_AMBULATORY_CARE_PROVIDER_SITE_OTHER): Payer: PPO | Admitting: Pharmacist

## 2023-09-08 ENCOUNTER — Telehealth: Payer: PPO

## 2023-09-08 DIAGNOSIS — E1165 Type 2 diabetes mellitus with hyperglycemia: Secondary | ICD-10-CM

## 2023-09-08 DIAGNOSIS — Z794 Long term (current) use of insulin: Secondary | ICD-10-CM

## 2023-09-08 MED ORDER — BASAGLAR KWIKPEN 100 UNIT/ML ~~LOC~~ SOPN: 40.0000 [IU] | PEN_INJECTOR | Freq: Every day | SUBCUTANEOUS | 5 refills | Status: DC

## 2023-09-08 NOTE — Telephone Encounter (Signed)
[id number] placed for CGM follow up

## 2023-09-08 NOTE — Telephone Encounter (Signed)
Ensure patient is re-enrolled for 2025 Basaglar 40 units Rybelsus 14 mg daily  Thank you!

## 2023-09-13 ENCOUNTER — Encounter: Payer: Self-pay | Admitting: Pharmacist

## 2023-09-20 ENCOUNTER — Ambulatory Visit (INDEPENDENT_AMBULATORY_CARE_PROVIDER_SITE_OTHER): Payer: PPO | Admitting: Pharmacist

## 2023-09-20 DIAGNOSIS — E1142 Type 2 diabetes mellitus with diabetic polyneuropathy: Secondary | ICD-10-CM

## 2023-09-20 DIAGNOSIS — E1165 Type 2 diabetes mellitus with hyperglycemia: Secondary | ICD-10-CM | POA: Diagnosis not present

## 2023-09-20 DIAGNOSIS — Z794 Long term (current) use of insulin: Secondary | ICD-10-CM | POA: Diagnosis not present

## 2023-09-20 NOTE — Progress Notes (Signed)
09/20/2023 Name: [name] MRN: [id number] DOB: Nov 13, 1960  Chief Complaint  Patient presents with   Diabetes    [name] is a 62 y.o. year old female who was referred for medication management by their primary care provider, Delynn Flavin M, DO. They presented for a face to face visit today.   They were referred to the pharmacist by their PCP for assistance in managing diabetes, medication access. PMH includes CAD (unstable angina, s/p stent placement), HTN, OSA, GERD, T2DM, peripheral edema, tobacco use.    Subjective: Pt reports doing ok today - she did not resume FL3 sensors or bring one with her today. She is having ShOB, reports that this is stable. She does get short of breath walking in her home.  She reports worsened leg swelling and pain. She wants to know if it is related to the dog bite she got in the spring.  She endorses ongoing fatigue - she is falling asleep a lot during the day. She was not able to tolerate CPAP in the past.   Care Team: Primary Care Provider: [name], DO ; Next Scheduled Visit: none   Medication Access/Adherence  Current Pharmacy:  [facility] [address], [place] - [address] [place] [address] Phone: [phone] Fax: [phone]  [facility]) - [place], [place] - [address] [facility] [address] [place] [address] Phone: [phone] Fax: [phone]  [facility] - [place], [place] - [address] [place] [address] Phone: [phone] Fax: [phone]   Patient reports affordability concerns with their medications: No  Patient reports access/transportation concerns to their pharmacy: No  - says that she can get her medications delivered if requested.  Patient reports adherence concerns with their medications:  No     Diabetes:  Current medications: insulin glargine  (Basaglar) 45 units once daily, semaglutide (Rybelsus) 14 mg PO daily (confirmed appropriate administration) Medications tried in the past: [place]- recurrent yeast infections, Trulicity- GI upset, metformin sitagliptin, glimepiride, novolog  Current glucose readings: Checks very rarely. Does not like to finger prick. Reports FBG a couple days ago of 120 mg/dL. Has not checked post-prandially. She continues to endorse sleepiness after eating.   Pt used FL3 CGM (with phone) in the past for ~ 1 year. Stopped because she had issues with her sensors falling off. Was using overgrip tape at one point, which was helpful.   Patient denies hypoglycemic s/sx including dizziness, shakiness, sweating. Patient denies hyperglycemic symptoms including polyuria, polydipsia, polyphagia, nocturia, neuropathy, blurred vision.  Current meal patterns: She admits to having minimal protein intake - Breakfast: sometimes eats breakfast; eggs, sausage biscuit or tomato biscuit. Usually NO breakfast - Lunch skips - Supper sandwich (bread, mayo, pepper) - Snacks none - Drinks water (4-5 bottles of water), coffee    Current medication access support: Novo Nordisk (Rybelsus), Chemical engineer)    Objective:  Lab Results  Component Value Date   HGBA1C 7.9 (H) 06/09/2023    Lab Results  Component Value Date   CREATININE 1.47 (H) 06/09/2023   BUN 23 06/09/2023   NA 134 (L) 06/09/2023   K 3.9 06/09/2023   CL 100 06/09/2023   CO2 25 06/09/2023    Lab Results  Component Value Date   CHOL 151 08/09/2022   HDL 38 (L) 08/09/2022   LDLCALC 76 08/09/2022   LDLDIRECT 52 01/12/2022  TRIG 224 (H) 08/09/2022   CHOLHDL 4.0 08/09/2022    Medications Reviewed Today     Reviewed by Particia Lather, RPH (Pharmacist) on 09/20/23 at 1621  Med List Status: <None>   Medication Order Taking? Sig Documenting Provider Last Dose Status Informant  acetaminophen (TYLENOL) 325 MG tablet [id number]  Take 975 mg by mouth every  8 (eight) hours as needed. [provider]  Active   albuterol (VENTOLIN HFA) 108 (90 Base) MCG/ACT inhaler [id number]  INHALE 2 PUFFS BY MOUTH EVERY 4 HOURS AS NEEDED FOR WHEEZING OR SHORTNESS OF BREATH (FOR  RESCUE) [name], DO  Active   Alpha-Lipoic Acid 600 MG CAPS [id number]  Take 1 capsule (600 mg total) by mouth daily. For diabetic neuropathy [name], DO  Active   aspirin EC 325 MG tablet [id number]  Take 325 mg by mouth daily. [provider]  Active   Blood Glucose Monitoring Suppl [facility] VERIO) w/Device KIT [id number]  Use to test blood sugar twice daily as directed; DX: E11.69 [name], DO  Active   furosemide (LASIX) 40 MG tablet [id number]  TAKE 1 TABLET BY MOUTH ONCE DAILY AS NEEDED FOR EDEMA OR FLUID [name], DO  Active            Med Note Sharen Hones   Thu Apr 21, 2023  8:41 AM) Reports taking daily   gabapentin (NEURONTIN) 600 MG tablet [id number]  TAKE 1 & 1/2 (ONE & ONE-HALF) TABLETS BY MOUTH THREE TIMES DAILY Delynn Flavin M, DO  Active   glucose blood (ONETOUCH VERIO) test strip [id number]  Use to test blood sugar twice daily as directed; DX: E11.69 Delynn Flavin M, DO  Active   Insulin Glargine (BASAGLAR KWIKPEN) 100 UNIT/ML [id number] Yes Inject 40 Units into the skin at bedtime. [name], DO Taking Active            Med Note Nelma Rothman   Tue Sep 20, 2023  3:07 PM) 45 Units per day  methocarbamol (ROBAXIN) 500 MG tablet [id number]  Take 1 tablet (500 mg total) by mouth every 6 (six) hours as needed for muscle spasms. Delynn Flavin M, DO  Active   metoprolol tartrate (LOPRESSOR) 25 MG tablet [id number]  Take 1 tablet (25 mg total) by mouth 2 (two) times daily. Sharlene Dory, NP  Active   nitroGLYCERIN (NITROSTAT) 0.4 MG SL tablet [id number]  Place 1 tablet (0.4 mg total) under the tongue every 5 (five) minutes x 3 doses as needed for chest pain (if no relief after 3rd dose, proceed to  ED or call 911). Sharlene Dory, NP  Active   omeprazole (PRILOSEC) 40 MG capsule [id number]  Take 1 capsule (40 mg total) by mouth daily. For heartburn Delynn Flavin M, DO  Active   OneTouch Delica Lancets 30G MISC [id number]  Use to test blood sugar twice daily as directed; DX: E11.69 Delynn Flavin M, DO  Active   oxybutynin (DITROPAN XL) 5 MG 24 hr tablet [id number]  Take 1 tablet (5 mg total) by mouth at bedtime. For bladder Delynn Flavin M, DO  Active   rosuvastatin (CRESTOR) 20 MG tablet [id number]  Take 1 tablet (20 mg total) by mouth at bedtime. [name], DO  Active   Semaglutide 14 MG TABS [id number] Yes Take 14 mg by mouth daily. [provider] Taking Active            Med Note Cresenciano Genre, Leatha Gilding Sep 22, 2021  3:08 PM) VIA NOVO NORDISK PATIENT ASSISTANCE PROGRAM  sertraline (ZOLOFT) 50 MG tablet [id number]  Take 1 tablet (50 mg total) by mouth daily. Daily for depression/ anxiety Delynn Flavin M, DO  Active   telmisartan (MICARDIS) 40 MG tablet [id number]  Take 1 tablet (40 mg total) by mouth daily. Delynn Flavin M, DO  Active   Vitamin D, Ergocalciferol, (DRISDOL) 1.25 MG (50000 UNIT) CAPS capsule [id number]  TAKE 1 CAPSULE BY MOUTH ONCE A WEEK, THEN TAKE OTC VITAMIN D 800IU DAILY Gottschalk, Ashly M, DO  Active               Assessment/Plan:   Diabetes: - Currently uncontrolled with last A1c of 7.9% above goal <7% and increased from 6.9% in May 2024. Unclear how much impact increasing insulin glargine to 45 units daily has made, since pt is not monitoring BG. She is willing to retrial FL3 sensors, and if this does not work out she will temporarily increase SMBG via fingersticks for ~1 mo for [place] to adjust her regimen. Great candidate for continued use of GLP-1RA given metabolic syndrome - should continue to monitor for adequate nutrient intake.  - Reviewed long term cardiovascular and renal outcomes of uncontrolled blood sugar - Reviewed goal  A1c, goal fasting, and goal 2 hour post prandial glucose - Reviewed dietary modifications including increasing protein intake, increasing intake of non-starchy vegetables, minimizing carbohydrate heavy foods.  - Reviewed lifestyle modifications including: - Recommend to continue insulin glargine (Basaglar) 45 units subcutaneous daily, semaglutide (Rybelsus) 14 mg PO daily  - Recommend to check glucose continuously with FL3 CGM. Connected FL3 app to [facility] today - for review at telephone follow-up. Sent Rx for FL3+ sensors to preferred pharmacy - Meets financial criteria for Basaglar ([employer]) and Rybelsus ([employer]ovoCares) patient assistance program through 2024. Will collaborate with provider, CPhT, and patient to pursue assistance renewal for 2025.   Smoking Cessation: - Pt is interested in cutting back smoking. Currently smokes 0.5 PPD, or 1 PPD if she is upset about something. She has never tried nicotine replacement therapy - Provided with information for 1-800-QUIT-NOW. Will provide further education and encouragement at f/u telephone appointment.   Medication Management: - Advised patient to schedule PCP appt to evaluate perceived swelling in LE (no pitting edema on exam today, but she reports that her legs are larger than usual). Sounds like she may not be having full response to furosemide 40 mg PO daily (urinates ~10AM after taking the medication at ~8AM). Echo in May 2024 with evidence of G1DD.  - Advised to talk with PCP about alternatives to CPAP to better control OSA.  - Pt has not been diagnosed with COPD but reports significant difficulty breathing in the setting of ongoing smoking. Would likely benefit from COPD evaluation and potential initiation of LABA/LAMA inhaler vs albuterol alone.    Follow Up Plan: Pharmacist telephone 10/20/23, PCP needs to be scheduled  Nils Pyle, PharmD PGY1 Pharmacy Resident   Kieth Brightly, PharmD, BCACP, CPP Clinical  Pharmacist, [facility]

## 2023-09-20 NOTE — Patient Instructions (Addendum)
It was nice to see you today!  Please pick up the [facility] [employer] 3 sensor from [place]. If this does not work out, please check your sugar with finger sticks once daily fasting and occasionally 1-2 hours after eating.   We will call you in about a month to review the readings.   Try to increase your protein intake. Minimize carbohydrate foods like breads, rice, pasta, potatoes, corn. Increase non-starchy vegetables like greens, carrots, peppers, mushrooms. Avoid sugary beverages.   Let us know if you have any questions or concerns.

## 2023-09-22 ENCOUNTER — Encounter: Payer: Self-pay | Admitting: Family Medicine

## 2023-09-23 MED ORDER — BASAGLAR KWIKPEN 100 UNIT/ML ~~LOC~~ SOPN: 45.0000 [IU] | PEN_INJECTOR | Freq: Every day | SUBCUTANEOUS | 5 refills | Status: DC

## 2023-09-23 MED ORDER — FREESTYLE LIBRE 3 PLUS SENSOR MISC: 11 refills | Status: DC

## 2023-09-26 ENCOUNTER — Telehealth: Payer: Self-pay

## 2023-09-26 NOTE — Telephone Encounter (Signed)
Copied from CRM [id number]. Topic: Clinical - Prescription Issue >> Sep 26, 2023 11:56 AM Roswell Nickel wrote: Reason for CRM: patient calling to let Raynelle Fanning know that [facility] did not receive the script for  Diabietic needle she has sent in. Pt contact info [phone]

## 2023-09-28 ENCOUNTER — Telehealth: Payer: Self-pay

## 2023-09-28 DIAGNOSIS — E1165 Type 2 diabetes mellitus with hyperglycemia: Secondary | ICD-10-CM

## 2023-09-28 NOTE — Progress Notes (Deleted)
Pharmacy Medication Assistance Program Note    09/28/2023  Patient ID: [name], female   DOB: 1961/03/24, 62 y.o.   MRN: [id number]     09/28/2023  Outreach Medication One  Manufacturer Medication One [employer] Basaglar  Type of Assistance Manufacturer Assistance  Date Application Sent to Patient 09/28/2023           09/28/2023  Outreach Medication Two  Manufacturer Medication Two [employer]  Nord[employer] Drugs Rybelsus  Type of Radiographer, therapeutic Assistance  Date Application Sent to Patient 09/28/2023

## 2023-09-28 NOTE — Telephone Encounter (Signed)
Mailed renewals to pt. New encounter created.

## 2023-10-07 NOTE — Telephone Encounter (Signed)
Rec'd completed [employer] patient pages.

## 2023-10-11 ENCOUNTER — Telehealth: Payer: Self-pay | Admitting: Pharmacist

## 2023-10-11 NOTE — Telephone Encounter (Signed)
   Diabetes  Patient requesting insulin pen needles (8mm, 29 G).  She found a box at home, but will need RX sent to the pharmacy.  Reviewed libre view.  Patient is having too many episodes of hypoglycemia that she reports is due to not eating.  Recommended patient eat 3 balanced/healthy plate meals daily (or at least a snack & not wait to long).  FSL3+ was filled at the pharmacy and patient is wearing & connected with Josephine Igo view     Will f/u with patient on 10/20/23   Kieth Brightly, PharmD, BCACP, CPP Clinical Pharmacist, [facility]

## 2023-10-11 NOTE — Telephone Encounter (Signed)
Patient found a box at home She prefers 8mm pen needles for the future (for insulin)

## 2023-10-13 ENCOUNTER — Other Ambulatory Visit: Payer: Self-pay

## 2023-10-13 NOTE — Patient Instructions (Signed)
Visit Information  Thank you for taking time to visit with me today. Please don't hesitate to contact me if I can be of assistance to you before our next scheduled telephone appointment.  Following are the goals we discussed today:   Goals Addressed             This Visit's Progress    RNCM Care Management  (DIABETES) EXPECTED OUTCOME: MONITOR, SELF-MANAGE AND REDUCE SYMPTOMS OF DIABETES       Current Barriers:  Knowledge Deficits related to Diabetes management Chronic Disease Management support and education needs related to Diabetes, diet Financial Constraints.  Patient reports CBG is monitored with Freestyle Libre with fasting ranges 90-153, random ranges low- mid 100-200's Patient reports she has initial consult for wound care on 04/07/23 due to "a puppy bit me on right leg",  pt is finished with antibiotic, update- pt states she decided not to go to wound center and states " the wound is almost completely healed" Update- pt states the affected leg (puppy bite), is the left leg, not the right leg, states this is healed but she still has slight redness and some swelling, states she is going to make appointment with primary care provider to have checked Patient reports sometimes she only eats one meal per day, depending on how she feels Lab Results  Component Value Date   HGBA1C 7.9 (H) 06/09/2023     Planned Interventions: Provided education to patient about basic DM disease process. The patient states she feels overall she is doing well with managing her DM. She did have a low early am. The patient states that it does not happen very often. The biggest concern she has right now is swelling in her feet and legs. The patient tried to call the office to get an earlier appointment but could not get one until the end of January. A message sent to staff to see if the patient could be seen earlier. ; Reviewed medications with patient and discussed importance of medication adherence. The  patient is compliant with medications. The patient states she works with the pharm D and she has a follow up appointment next week;        Counseled on importance of regular laboratory monitoring as prescribed. Labs up to date.;        Advised patient, providing education and rationale, to check cbg per CGM  and record. Has a freestyle libre. Does have trouble sometimes with it staying on her arm. Her daughter has been putting a clear bandage over it. This am her alarm went off and her blood sugar was 45 it came up to 77 and she has been having good numbers today. Review and education provided.        Review of patient status, including review of consultants reports, relevant laboratory and other test results, and medications completed;       Advised patient to discuss non healing wounds, signs of infection  with provider;      Reviewed signs/ symptoms of infection, reportable signs/ symptoms Reviewed carbohydrate modified diet Reviewed importance of eating 3 well balanced meals per day, preferably at the same time each day  Symptom Management: Take medications as prescribed   Attend all scheduled provider appointments Call pharmacy for medication refills 3-7 days in advance of running out of medications Attend church or other social activities Call provider office for new concerns or questions  Work with the social worker to address care coordination needs and will continue to  work with the clinical team to address health care and disease management related needs check blood sugar at prescribed times: Freestyle Libre  check feet daily for cuts, sores or redness enter blood sugar readings and medication or insulin into daily log take the blood sugar log to all doctor visits take the blood sugar meter to all doctor visits trim toenails straight across limit fast food meals to no more than 1 per week manage portion size prepare main meal at home 3 to 5 days each week read food labels for fat,  fiber, carbohydrates and portion size set a realistic goal keep feet up while sitting wash and dry feet carefully every day wear comfortable, cotton socks Call your doctor for signs of skin infection/ non healing- redness, foul smelling drainage, warmth, fever, if overall you are not feeling well Try to eat 3 meals per day around the same time each day  Follow Up Plan: Telephone follow up appointment with care management team member scheduled for: 12-08-2023 at 230 pm       RNCM: Care Management (HYPERTENSION) EXPECTED OUTCOME: SELF-MANAGE AND REDUCE SYMPTOMS OF HYPERTENSION       Current Barriers:  Knowledge Deficits related to Hypertension management Care Coordination needs related to pharmacy/ social work needs in a patient with Hypertension Chronic Disease Management support 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  No Advanced Directives in place- pt declines Patient reports she lives with spouse, has cane and walker for ambulation, spouse assists with cooking due to pt cannot stand up for long periods, pt has had no falls, spouse provides transportation Patient reports she is on payment plan for power bill and has difficulty affording food, receives 23$/ month food stamps Patient has blood pressure cuff but does not monitor blood pressure Patient reports she has all medications including albuterol and using as prescribed, pharmacist is working with pt BP Readings from Last 3 Encounters:  06/13/23 (!) 97/57  06/09/23 (!) 115/50  05/24/23 (!) 106/56     Planned Interventions: Evaluation of current treatment plan related to hypertension self management and patient's adherence to plan as established by provider. Denies any fluctuations with her blood pressures. She is having fluid retention. Education provided on weighing self to see if she is having water shifts. Wants to get in to see the provider to see if she can get some help for the pain and discomfort  she is experiencing in her legs due to the swelling.    Reviewed medications with patient and discussed importance of compliance. Working with the pharm D currently, appointment next week;  Counseled on the importance of exercise goals with target of 150 minutes per week Advised patient, providing education and rationale, to monitor blood pressure daily and record, calling PCP for findings outside established parameters;  Reviewed low sodium diet  Symptom Management: Take medications as prescribed   Attend all scheduled provider appointments Call pharmacy for medication refills 3-7 days in advance of running out of medications Attend church or other social activities Call provider office for new concerns or questions  Work with the social worker to address care coordination needs and will continue to work with the clinical team to address health care and disease management related needs check blood pressure 3 times per week choose a place to take my blood pressure (home, clinic or office, retail store) write blood pressure results in a log or diary learn about high blood pressure keep a blood pressure log keep all doctor appointments take  medications for blood pressure exactly as prescribed begin an exercise program report new symptoms to your doctor eat more whole grains, fruits and vegetables, lean meats and healthy fats Follow low sodium diet- read food labels for sodium content Limit/ avoid fast food  Follow Up Plan: Telephone follow up appointment with care management team member scheduled for:   12-08-2023 at 230 pm           Our next appointment is by telephone on 12-08-2023 at 230 pm  Please call the care guide team at [phone] if you need to cancel or reschedule your appointment.   If you are experiencing a Mental Health or Behavioral Health Crisis or need someone to talk to, please call the Suicide and Crisis Lifeline: 988 call the [place] National Suicide Prevention  Lifeline: 367-498-9893 or TTY: (726)445-9355 TTY 936-818-3515) to talk to a trained counselor call 1-800-273-TALK (toll free, 24 hour hotline) call the [facility]: (508)500-0693   The patient verbalized understanding of instructions, educational materials, and care plan provided today and DECLINED offer to receive copy of patient instructions, educational materials, and care plan.     Alto Denver RN, MSN, CCM RN Care Manager  [facility]  Ambulatory Care Management  Direct Number: [phone]

## 2023-10-13 NOTE — Patient Outreach (Signed)
Care Management   Visit Note  10/13/2023 Name: [name] MRN: [id number] DOB: 23-Aug-1961  Subjective: [name] is a 62 y.o. year old female who is a primary care patient of [name], DO. The Care Management team was consulted for assistance.      Engaged with patient spoke with patient by telephone.    Goals Addressed             This Visit's Progress    RNCM Care Management  (DIABETES) EXPECTED OUTCOME: MONITOR, SELF-MANAGE AND REDUCE SYMPTOMS OF DIABETES       Current Barriers:  Knowledge Deficits related to Diabetes management Chronic Disease Management support and education needs related to Diabetes, diet Financial Constraints.  Patient reports CBG is monitored with Freestyle Libre with fasting ranges 90-153, random ranges low- mid 100-200's Patient reports she has initial consult for wound care on 04/07/23 due to "a puppy bit me on right leg",  pt is finished with antibiotic, update- pt states she decided not to go to wound center and states " the wound is almost completely healed" Update- pt states the affected leg (puppy bite), is the left leg, not the right leg, states this is healed but she still has slight redness and some swelling, states she is going to make appointment with primary care provider to have checked Patient reports sometimes she only eats one meal per day, depending on how she feels Lab Results  Component Value Date   HGBA1C 7.9 (H) 06/09/2023     Planned Interventions: Provided education to patient about basic DM disease process. The patient states she feels overall she is doing well with managing her DM. She did have a low early am. The patient states that it does not happen very often. The biggest concern she has right now is swelling in her feet and legs. The patient tried to call the office to get an earlier appointment but could not get one until the end of January. A message sent to staff to see if the patient could be seen earlier.  ; Reviewed medications with patient and discussed importance of medication adherence. The patient is compliant with medications. The patient states she works with the pharm D and she has a follow up appointment next week;        Counseled on importance of regular laboratory monitoring as prescribed. Labs up to date.;        Advised patient, providing education and rationale, to check cbg per CGM  and record. Has a freestyle libre. Does have trouble sometimes with it staying on her arm. Her daughter has been putting a clear bandage over it. This am her alarm went off and her blood sugar was 45 it came up to 77 and she has been having good numbers today. Review and education provided.        Review of patient status, including review of consultants reports, relevant laboratory and other test results, and medications completed;       Advised patient to discuss non healing wounds, signs of infection  with provider;      Reviewed signs/ symptoms of infection, reportable signs/ symptoms Reviewed carbohydrate modified diet Reviewed importance of eating 3 well balanced meals per day, preferably at the same time each day  Symptom Management: Take medications as prescribed   Attend all scheduled provider appointments Call pharmacy for medication refills 3-7 days in advance of running out of medications Attend church or other social activities Call provider office  for new concerns or questions  Work with the Child psychotherapist to address care coordination needs and will continue to work with the clinical team to address health care and disease management related needs check blood sugar at prescribed times: [name]  check feet daily for cuts, sores or redness enter blood sugar readings and medication or insulin into daily log take the blood sugar log to all doctor visits take the blood sugar meter to all doctor visits trim toenails straight across limit fast food meals to no more than 1 per  week manage portion size prepare main meal at home 3 to 5 days each week read food labels for fat, fiber, carbohydrates and portion size set a realistic goal keep feet up while sitting wash and dry feet carefully every day wear comfortable, cotton socks Call your doctor for signs of skin infection/ non healing- redness, foul smelling drainage, warmth, fever, if overall you are not feeling well Try to eat 3 meals per day around the same time each day  Follow Up Plan: Telephone follow up appointment with care management team member scheduled for: 12-08-2023 at 230 pm       RNCM: Care Management (HYPERTENSION) EXPECTED OUTCOME: SELF-MANAGE AND REDUCE SYMPTOMS OF HYPERTENSION       Current Barriers:  Knowledge Deficits related to Hypertension management Care Coordination needs related to pharmacy/ social work needs in a patient with Hypertension Chronic Disease Management support 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  No Advanced Directives in place- pt declines Patient reports she lives with spouse, has cane and walker for ambulation, spouse assists with cooking due to pt cannot stand up for long periods, pt has had no falls, spouse provides transportation Patient reports she is on payment plan for power bill and has difficulty affording food, receives 23$/ month food stamps Patient has blood pressure cuff but does not monitor blood pressure Patient reports she has all medications including albuterol and using as prescribed, pharmacist is working with pt BP Readings from Last 3 Encounters:  06/13/23 (!) 97/57  06/09/23 (!) 115/50  05/24/23 (!) 106/56     Planned Interventions: Evaluation of current treatment plan related to hypertension self management and patient's adherence to plan as established by provider. Denies any fluctuations with her blood pressures. She is having fluid retention. Education provided on weighing self to see if she is having water  shifts. Wants to get in to see the provider to see if she can get some help for the pain and discomfort she is experiencing in her legs due to the swelling.    Reviewed medications with patient and discussed importance of compliance. Working with the pharm D currently, appointment next week;  Counseled on the importance of exercise goals with target of 150 minutes per week Advised patient, providing education and rationale, to monitor blood pressure daily and record, calling PCP for findings outside established parameters;  Reviewed low sodium diet  Symptom Management: Take medications as prescribed   Attend all scheduled provider appointments Call pharmacy for medication refills 3-7 days in advance of running out of medications Attend church or other social activities Call provider office for new concerns or questions  Work with the social worker to address care coordination needs and will continue to work with the clinical team to address health care and disease management related needs check blood pressure 3 times per week choose a place to take my blood pressure (home, clinic or office, retail store) write blood pressure results  in a log or diary learn about high blood pressure keep a blood pressure log keep all doctor appointments take medications for blood pressure exactly as prescribed begin an exercise program report new symptoms to your doctor eat more whole grains, fruits and vegetables, lean meats and healthy fats Follow low sodium diet- read food labels for sodium content Limit/ avoid fast food  Follow Up Plan: Telephone follow up appointment with care management team member scheduled for:   12-08-2023 at 230 pm           Consent to Services:  Patient was given information about care management services, agreed to services, and gave verbal consent to participate.   Plan: Telephone follow up appointment with care management team member scheduled for: 12-08-2023 at 230  pm  Alto Denver RN, MSN, CCM RN Care Manager  [facility]  Ambulatory Care Management  Direct Number: [phone]

## 2023-10-14 NOTE — Telephone Encounter (Signed)
-----   Message from [place] sent at 10/11/2023 10:19 AM EST ----- Regarding: RE: lymphedema Since she is Medicare, she will need some type of Face to Face visit for ins to approve. Haven't seen her since summer.  We can do as a video visit if she has that capability.  Just have her call the office to set up. I'm sure Thayer Jew can fit her in on a DOD if needed. I may have a few video slots left today. ----- Message ----- From: Danella Maiers, [facility] Sent: 10/11/2023  10:07 AM EST To: [name], DO Subject: RE: lymphedema                                 Sorry! Home health referral for leg wraps She is scheduled with you 12/09/23 (just didn't know insurance rules) Happy to call patient back with info ----- Message ----- From: [name], DO Sent: 10/11/2023   9:11 AM EST To: Danella Maiers, RPH Subject: RE: lymphedema                                 Raynelle Fanning, I'm confused. Is she asking for Home health referral to start wrapping legs or is she wanting to be seen here to get legs wrapped? ----- Message ----- From: Danella Maiers, [facility] Sent: 10/11/2023   9:05 AM EST To: [name], DO Subject: lymphedema                                     Passing along per patient.. RN came to patient's home to her assess and recommended patient have legs wrapped due to fluid (come to patient's home).  Patient has an appt scheduled with you at the end of the month.  Would she need to wait until appt to have this documented for insurance?  Thanks! Raynelle Fanning

## 2023-10-14 NOTE — Telephone Encounter (Signed)
I have attempted to call pt , no answer   Left vm   We are DOD next week and the following week have a opening on 12/20   Please let me know if pt calls back

## 2023-10-18 MED ORDER — BASAGLAR KWIKPEN 100 UNIT/ML ~~LOC~~ SOPN: 45.0000 [IU] | PEN_INJECTOR | Freq: Every day | SUBCUTANEOUS | 5 refills | Status: DC

## 2023-10-18 NOTE — Addendum Note (Signed)
Addended by: [name] on: 10/18/2023 02:44 PM   Modules accepted: Orders

## 2023-10-18 NOTE — Telephone Encounter (Signed)
Sent  Meds ordered this encounter  Medications   Insulin Glargine (BASAGLAR KWIKPEN) 100 UNIT/ML    Sig: Inject 45-50 Units into the skin at bedtime. May increase up to 50 units if instructed by your provider.    Dispense:  45 mL    Refill:  5    Dose increase

## 2023-10-18 NOTE — Progress Notes (Signed)
Pharmacy Medication Assistance Program Note    10/20/2023  Patient ID: [name], female   DOB: 18-Jan-1961, 62 y.o.   MRN: [id number]     09/28/2023  Outreach Medication One  Manufacturer Medication One Retail buyer Drugs Basaglar  Type of Assistance Manufacturer Assistance  Date Application Sent to Patient 09/28/2023  Date Application Sent to Prescriber 10/07/2023  Name of Prescriber Delynn Flavin  Date Application Received From Patient 09/30/2023  Date Application Received From Provider 10/11/2023  Date Application Submitted to Manufacturer 10/18/2023  Method Application Sent to Manufacturer Fax  Patient Assistance Determination Approved  Approval Start Date 11/09/2023  Approval End Date 11/07/2024     Please send new RX for Basaglar to [facility] pharmacy for renewal.

## 2023-10-20 ENCOUNTER — Other Ambulatory Visit: Payer: PPO | Admitting: Pharmacist

## 2023-10-20 DIAGNOSIS — Z7984 Long term (current) use of oral hypoglycemic drugs: Secondary | ICD-10-CM

## 2023-10-20 DIAGNOSIS — E119 Type 2 diabetes mellitus without complications: Secondary | ICD-10-CM

## 2023-10-20 DIAGNOSIS — Z794 Long term (current) use of insulin: Secondary | ICD-10-CM

## 2023-10-20 MED ORDER — INSULIN PEN NEEDLE 29G X 8MM MISC
5 refills | Status: AC
Start: 2023-10-20 — End: ?

## 2023-10-20 NOTE — Progress Notes (Unsigned)
Needs rbyelsus 14mg  left 2 boxes up front Libre 3 plus sample given

## 2023-10-21 ENCOUNTER — Encounter: Payer: Self-pay | Admitting: Family Medicine

## 2023-10-21 ENCOUNTER — Ambulatory Visit (INDEPENDENT_AMBULATORY_CARE_PROVIDER_SITE_OTHER): Payer: PPO | Admitting: Family Medicine

## 2023-10-21 VITALS — BP 106/60 | HR 74 | Temp 98.8°F | Resp 30 | Ht 62.0 in | Wt 299.0 lb

## 2023-10-21 DIAGNOSIS — M5410 Radiculopathy, site unspecified: Secondary | ICD-10-CM | POA: Diagnosis not present

## 2023-10-21 DIAGNOSIS — M79605 Pain in left leg: Secondary | ICD-10-CM | POA: Diagnosis not present

## 2023-10-21 DIAGNOSIS — I890 Lymphedema, not elsewhere classified: Secondary | ICD-10-CM | POA: Diagnosis not present

## 2023-10-21 DIAGNOSIS — M79604 Pain in right leg: Secondary | ICD-10-CM

## 2023-10-21 NOTE — Progress Notes (Signed)
Subjective: CC: Face-to-face for home health PCP: Stacey Ip, DO ZOX:[id number] [name] is a 62 y.o. female presenting to clinic today for:  1.  Leg swelling Patient with known lymphedema.  We had previously referred her to the lymphedema clinic and she was given lymphatic pumps.  However, she admits that she is not really able to use these well because they hurt her.  She notes some mild redness at the base of her legs reports that it feels like she is walking on nails when she does.  Her feet have been swollen as of late.  She reports no changes in her breathing.  She gets short of breath with ambulation at baseline.  She is here to talk about getting the leg wraps through home health because she is not able to get herself to any specialist at this time to do this for her.   ROS: Per HPI  Allergies  Allergen Reactions   Duloxetine    Farxiga [Dapagliflozin]     RECURRENT YEAST INFECTIONS   Morphine Nausea And Vomiting   Trulicity [Dulaglutide]     GI adverse effects (okay on Rybelsus)   Past Medical History:  Diagnosis Date   Arthritis    CAD (coronary artery disease)    BMS to circumflex 2007 - Dr. Juanda Chance   CKD (chronic kidney disease) stage 3, GFR 30-59 ml/min (HCC)    COPD (chronic obstructive pulmonary disease) (HCC)    Depression    Dyspnea    Essential hypertension    Falls frequently    GERD (gastroesophageal reflux disease)    HA (headache)    Hyperlipidemia    Hyperlipidemia associated with type 2 diabetes mellitus (HCC) 02/03/2011   Left-sided face pain    Morbid obesity (HCC)    Noncompliance    OSA (obstructive sleep apnea)    Uses CPAP   Type 2 diabetes mellitus (HCC)    Urinary incontinence    Urinary incontinence     Current Outpatient Medications:    acetaminophen (TYLENOL) 325 MG tablet, Take 975 mg by mouth every 8 (eight) hours as needed., Disp: , Rfl:    albuterol (VENTOLIN HFA) 108 (90 Base) MCG/ACT inhaler, INHALE 2 PUFFS BY MOUTH EVERY 4  HOURS AS NEEDED FOR WHEEZING OR SHORTNESS OF BREATH (FOR  RESCUE), Disp: 9 g, Rfl: 0   Alpha-Lipoic Acid 600 MG CAPS, Take 1 capsule (600 mg total) by mouth daily. For diabetic neuropathy, Disp: 100 capsule, Rfl: 3   aspirin EC 325 MG tablet, Take 325 mg by mouth daily., Disp: , Rfl:    Blood Glucose Monitoring Suppl (ONETOUCH VERIO) w/Device KIT, Use to test blood sugar twice daily as directed; DX: E11.69, Disp: 1 kit, Rfl: 1   Continuous Glucose Sensor (FREESTYLE LIBRE 3 PLUS SENSOR) MISC, Change sensor every 15 days. DX: E11.65, Disp: 2 each, Rfl: 11   furosemide (LASIX) 40 MG tablet, TAKE 1 TABLET BY MOUTH ONCE DAILY AS NEEDED FOR EDEMA OR FLUID, Disp: 90 tablet, Rfl: 3   gabapentin (NEURONTIN) 600 MG tablet, TAKE 1 & 1/2 (ONE & ONE-HALF) TABLETS BY MOUTH THREE TIMES DAILY, Disp: 270 tablet, Rfl: 3   glucose blood (ONETOUCH VERIO) test strip, Use to test blood sugar twice daily as directed; DX: E11.69, Disp: 100 each, Rfl: 5   Insulin Glargine (BASAGLAR KWIKPEN) 100 UNIT/ML, Inject 45-50 Units into the skin at bedtime. May increase up to 50 units if instructed by your provider., Disp: 45 mL, Rfl: 5  Insulin Pen Needle 29G X MISC, Use to inject insulin daily as directed. DX:E11.65; please profile for future needs, Disp: 300 each, Rfl: 5   methocarbamol (ROBAXIN) 500 MG tablet, Take 1 tablet (500 mg total) by mouth every 6 (six) hours as needed for muscle spasms., Disp: 120 tablet, Rfl: 1   metoprolol tartrate (LOPRESSOR) 25 MG tablet, Take 1 tablet (25 mg total) by mouth 2 (two) times daily., Disp: 180 tablet, Rfl: 2   nitroGLYCERIN (NITROSTAT) 0.4 MG SL tablet, Place 1 tablet (0.4 mg total) under the tongue every 5 (five) minutes x 3 doses as needed for chest pain (if no relief after 3rd dose, proceed to ED or call 911)., Disp: 25 tablet, Rfl: 3   omeprazole (PRILOSEC) 40 MG capsule, Take 1 capsule (40 mg total) by mouth daily. For heartburn, Disp: 90 capsule, Rfl: 3   OneTouch Delica Lancets  30G MISC, Use to test blood sugar twice daily as directed; DX: E11.69, Disp: 100 each, Rfl: 3   oxybutynin (DITROPAN XL) 5 MG 24 hr tablet, Take 1 tablet (5 mg total) by mouth at bedtime. For bladder, Disp: 90 tablet, Rfl: 3   rosuvastatin (CRESTOR) 20 MG tablet, Take 1 tablet (20 mg total) by mouth at bedtime., Disp: 90 tablet, Rfl: 3   Semaglutide 14 MG TABS, Take 14 mg by mouth daily., Disp: , Rfl:    sertraline (ZOLOFT) 50 MG tablet, Take 1 tablet (50 mg total) by mouth daily. Daily for depression/ anxiety, Disp: 90 tablet, Rfl: 3   telmisartan (MICARDIS) 40 MG tablet, Take 1 tablet (40 mg total) by mouth daily., Disp: 90 tablet, Rfl: 3   Vitamin D, Ergocalciferol, (DRISDOL) 1.25 MG (50000 UNIT) CAPS capsule, TAKE 1 CAPSULE BY MOUTH ONCE A WEEK, THEN TAKE OTC VITAMIN D 800IU DAILY, Disp: 5 capsule, Rfl: 0 Social History   Socioeconomic History   Marital status: Married    Spouse name: [name]   Number of children: 5   Years of education: 10 th   Highest education level: 10th grade  Occupational History   Occupation: disabled     Comment: disabled  Tobacco Use   Smoking status: Every Day    Current packs/day: 1.00    Average packs/day: 1 pack/day for 51.4 years (51.4 ttl pk-yrs)    Types: Cigarettes    Start date: 05/14/1972    Passive exposure: Current   Smokeless tobacco: Never   Tobacco comments:    Smoking Cessation Classes, Services, Agencies & Resources Offered.  Vaping Use   Vaping status: Never Used  Substance and Sexual Activity   Alcohol use: No    Alcohol/week: 0.0 standard drinks of alcohol   Drug use: No   Sexual activity: Not Currently    Partners: Male    Birth control/protection: Surgical  Other Topics Concern   Not on file  Social History Narrative   Patient lives at home with her husband. Patient is disabled.   Children live nearby   Patient has 10 th grade education.   Right handed.   Caffeine- one  cup of coffee and  One soda Dr.Pepper/ tea. daily    Social Drivers of Health   Financial Resource Strain: Medium Risk (10/13/2023)   Overall Financial Resource Strain (CARDIA)    Difficulty of Paying Living Expenses: Somewhat hard  Food Insecurity: No Food Insecurity (10/13/2023)   Hunger Vital Sign    Worried About Running Out of Food in the Last Year: Never true    Ran  Out of Food in the Last Year: Never true  Transportation Needs: No Transportation Needs (10/13/2023)   PRAPARE - Administrator, Civil Service (Medical): No    Lack of Transportation (Non-Medical): No  Physical Activity: Inactive (10/13/2023)   Exercise Vital Sign    Days of Exercise per Week: 0 days    Minutes of Exercise per Session: 0 min  Stress: No Stress Concern Present (10/13/2023)   [employer] of Occupational Health - Occupational Stress Questionnaire    Feeling of Stress : Only a little  Social Connections: Moderately Integrated (10/13/2023)   Social Connection and Isolation Panel [NHANES]    Frequency of Communication with Friends and Family: More than three times a week    Frequency of Social Gatherings with Friends and Family: More than three times a week    Attends Religious Services: More than 4 times per year    Active Member of [employer] or Organizations: No    Attends Banker Meetings: Never    Marital Status: Married  Catering manager Violence: Not At Risk (10/13/2023)   Humiliation, Afraid, Rape, and Kick questionnaire    Fear of Current or Ex-Partner: No    Emotionally Abused: No    Physically Abused: No    Sexually Abused: No   Family History  Problem Relation Age of Onset   Breast cancer Mother    Cancer Father    Coronary artery disease Father    Cancer - Colon Father    Diabetes Father    High Cholesterol Father    Arthritis Sister    Asthma Sister    High Cholesterol Daughter    Thyroid disease Daughter    Hiatal hernia Son    Congenital heart disease Son     Objective: Office vital signs  reviewed. BP 106/60   Pulse 74   Temp 98.8 F (37.1 C) (Temporal)   Resp (!) 30   Ht 5\' 2"  (1.575 m)   Wt 299 lb (135.6 kg)   SpO2 98%   BMI 54.69 kg/m   Physical Examination:  General: Awake, alert, morbidly obese, No acute distress Extremities: Bilateral lower extremity lymphedema.  She has some mild erythema along bilateral lower extremities anteriorly and laterally but no skin breakdown, weeping, warmth or bleeding.  Assessment/ Plan: 62 y.o. female   Lymphedema - Plan: Ambulatory referral to Orthopedic Surgery, Ambulatory referral to Home Health  Back pain with radiculopathy - Plan: Ambulatory referral to Orthopedic Surgery, Ambulatory referral to Home Health  Bilateral leg pain - Plan: Ambulatory referral to Orthopedic Surgery, Ambulatory referral to Home Health  I have placed orders for home health to see if we might be able to perform twice weekly Unna boots for this patient.  Set up Unna boots were placed on her today.  No evidence of cellulitis at this time but she certainly has risk of it given the degree of swelling.  I am also placing referral to Dr. Audrie Lia office to further explore maybe alternative lymphedema treatment options.  She was amenable to this referral and will arrange transportation at some point after the new year  No evidence of fluid overload otherwise to suggest a CHF component etc.  However, she does have Lasix on hand if needed.   Stacey Ip, DO [facility] [phone]

## 2023-10-28 ENCOUNTER — Telehealth: Payer: Self-pay | Admitting: Family Medicine

## 2023-10-28 NOTE — Telephone Encounter (Signed)
Copied from CRM [id number]. Topic: Clinical - Medication Question >> Oct 28, 2023  9:31 AM [name] wrote: Reason for CRM: Patient was seen by Dr. Nadine Counts the other day and was provided with some supplies to wrap her legs - she has some ace bandages, however, she is out of the cold pack stuff that is placed prior to wrapping her legs in the ace bandage - she would like to know if she could drop by and pick some up. Please call patient as she would like to continue to wrap her legs in the same way.

## 2023-10-31 ENCOUNTER — Telehealth: Payer: Self-pay

## 2023-10-31 NOTE — Telephone Encounter (Signed)
Copied from CRM [id number]. Topic: Clinical - Medication Question >> Oct 31, 2023 12:55 PM Antony Haste wrote: Reason for CRM: PT states she has not received a call from PCP or nurses pertaining to supplies to wrap her legs, she states she was informed her aces bandages are available in-office for pick-up, but would like to make sure she can receive assistance with getting her legs wrapped still?  Callback #: [phone]

## 2023-10-31 NOTE — Telephone Encounter (Signed)
This has been taken care of pt daughter came by office

## 2023-10-31 NOTE — Telephone Encounter (Signed)
Copied from CRM [id number]. Topic: Clinical - Medication Question >> Oct 31, 2023  4:15 PM Hector Shade B wrote: Reason for CRM: patient  states she has not received a call from PCP or nurses pertaining to supplies to wrap her legs, she states she was informed her aces bandages are available in-office for pick-up, but would like to make sure she can receive assistance with getting her legs wrapped still?

## 2023-11-03 ENCOUNTER — Encounter: Payer: Self-pay | Admitting: Family Medicine

## 2023-11-03 ENCOUNTER — Ambulatory Visit: Payer: PPO

## 2023-11-03 VITALS — BP 119/81 | HR 78 | Temp 97.9°F | Ht 62.0 in | Wt 283.0 lb

## 2023-11-03 DIAGNOSIS — M79604 Pain in right leg: Secondary | ICD-10-CM | POA: Diagnosis not present

## 2023-11-03 DIAGNOSIS — M79605 Pain in left leg: Secondary | ICD-10-CM | POA: Diagnosis not present

## 2023-11-03 DIAGNOSIS — I890 Lymphedema, not elsewhere classified: Secondary | ICD-10-CM | POA: Diagnosis not present

## 2023-11-03 NOTE — Progress Notes (Signed)
Subjective:  Patient ID: [name], female    DOB: 31-May-1961, 62 y.o.   MRN: [id number]  Patient Care Team: [name], DO as PCP - General (Family Medicine) Jonelle Sidle, MD as PCP - Cardiology (Cardiology) Levert Feinstein, MD as Consulting Physician (Neurology) Ranee Gosselin, MD as Consulting Physician (Orthopedic Surgery) Jonelle Sidle, MD as Consulting Physician (Cardiology) Danella Maiers, [facility] (Pharmacist) Marlowe Sax, RN as Case Manager (General Practice)   Chief Complaint:  Edema (BILATERAL LEGS PAIN BEEN GOING ON FOR YEARS )   HPI: [name] is a 62 y.o. female presenting on 11/03/2023 for Edema (BILATERAL LEGS PAIN BEEN GOING ON FOR YEARS )   Discussed the use of AI scribe software for clinical note transcription with the patient, who gave verbal consent to proceed.  History of Present Illness   The patient, with a history of lymphedema, presents with complaints of leg swelling and pain. She reports no weeping from the legs, but the swelling is significant and painful. Despite having lymphatic boots at home, she admits to not wearing them often due to difficulty in putting them on.  In addition to the lymphedema, the patient reports baseline shortness of breath, particularly when active or walking. No associated chest pain or palpitations. No weakness, dizziness, or confusion.   The patient was supposed to have home health come out to wrap her legs twice a week, a referral put in before Christmas, but this has not yet occurred. She has not had any contact from the home health agency.  The patient denies any fevers or chills, and no other new symptoms were reported.      Relevant past medical, surgical, family, and social history reviewed and updated as indicated.  Allergies and medications reviewed and updated. Data reviewed: Chart in Epic.   Past Medical History:  Diagnosis Date   Arthritis    CAD (coronary artery disease)    BMS to  circumflex 2007 - Dr. Juanda Chance   CKD (chronic kidney disease) stage 3, GFR 30-59 ml/min (HCC)    COPD (chronic obstructive pulmonary disease) (HCC)    Depression    Dyspnea    Essential hypertension    Falls frequently    GERD (gastroesophageal reflux disease)    HA (headache)    Hyperlipidemia    Hyperlipidemia associated with type 2 diabetes mellitus (HCC) 02/03/2011   Left-sided face pain    Morbid obesity (HCC)    Noncompliance    OSA (obstructive sleep apnea)    Uses CPAP   Type 2 diabetes mellitus (HCC)    Urinary incontinence    Urinary incontinence     Past Surgical History:  Procedure Laterality Date   ABDOMINAL HERNIA REPAIR     ABDOMINAL HYSTERECTOMY     BACK SURGERY     BREAST SURGERY     biopsy per right breast; pt states has silver piece in for marking    CARPAL TUNNEL RELEASE Left 06/13/2023   Procedure: LEFT CARPAL TUNNEL RELEASE;  Surgeon: Oliver Barre, MD;  Location: [facility] ORS;  Service: Orthopedics;  Laterality: Left;   Cataract surgery Bilateral    CHOLECYSTECTOMY     COLONOSCOPY N/A 04/10/2013   Procedure: COLONOSCOPY;  Surgeon: Malissa Hippo, MD;  Location: [facility] END[facility] SUITE;  Service: Endoscopy;  Laterality: N/A;   CORONARY ANGIOPLASTY WITH STENT PLACEMENT  2012   HERNIA REPAIR     LEFT HEART CATH AND CORONARY ANGIOGRAPHY N/A 06/19/2020  Procedure: LEFT HEART CATH AND CORONARY ANGIOGRAPHY;  Surgeon: Lyn Records, MD;  Location: [facility] INVASIVE CV LAB;  Service: Cardiovascular;  Laterality: N/A;   LUMBAR LAMINECTOMY/DECOMPRESSION MICRODISCECTOMY N/A 04/25/2015   Procedure: CENTRAL LUMBAR /DECOMPRESSION L4-L5;  Surgeon: Ranee Gosselin, MD;  Location: [facility] ORS;  Service: Orthopedics;  Laterality: N/A;   TUBAL LIGATION      Social History   Socioeconomic History   Marital status: Married    Spouse name: [name]   Number of children: 5   Years of education: 10 th   Highest education level: 10th grade  Occupational History   Occupation: disabled      Comment: disabled  Tobacco Use   Smoking status: Every Day    Current packs/day: 1.00    Average packs/day: 1 pack/day for 51.5 years (51.5 ttl pk-yrs)    Types: Cigarettes    Start date: 05/14/1972    Passive exposure: Current   Smokeless tobacco: Never   Tobacco comments:    Smoking Cessation Classes, Services, Agencies & Resources Offered.  Vaping Use   Vaping status: Never Used  Substance and Sexual Activity   Alcohol use: No    Alcohol/week: 0.0 standard drinks of alcohol   Drug use: No   Sexual activity: Not Currently    Partners: Male    Birth control/protection: Surgical  Other Topics Concern   Not on file  Social History Narrative   Patient lives at home with her husband. Patient is disabled.   Children live nearby   Patient has 10 th grade education.   Right handed.   Caffeine- one  cup of coffee and  One soda Dr.Pepper/ tea. daily   Social Drivers of Health   Financial Resource Strain: Medium Risk (10/13/2023)   Overall Financial Resource Strain (CARDIA)    Difficulty of Paying Living Expenses: Somewhat hard  Food Insecurity: No Food Insecurity (10/13/2023)   Hunger Vital Sign    Worried About Running Out of Food in the Last Year: Never true    Ran Out of Food in the Last Year: Never true  Transportation Needs: No Transportation Needs (10/13/2023)   PRAPARE - Administrator, Civil Service (Medical): No    Lack of Transportation (Non-Medical): No  Physical Activity: Inactive (10/13/2023)   Exercise Vital Sign    Days of Exercise per Week: 0 days    Minutes of Exercise per Session: 0 min  Stress: No Stress Concern Present (10/13/2023)   [employer] of Occupational Health - Occupational Stress Questionnaire    Feeling of Stress : Only a little  Social Connections: Moderately Integrated (10/13/2023)   Social Connection and Isolation Panel [NHANES]    Frequency of Communication with Friends and Family: More than three times a week    Frequency of  Social Gatherings with Friends and Family: More than three times a week    Attends Religious Services: More than 4 times per year    Active Member of [employer] or Organizations: No    Attends Banker Meetings: Never    Marital Status: Married  Catering manager Violence: Not At Risk (10/13/2023)   Humiliation, Afraid, Rape, and Kick questionnaire    Fear of Current or Ex-Partner: No    Emotionally Abused: No    Physically Abused: No    Sexually Abused: No    Outpatient Encounter Medications as of 11/03/2023  Medication Sig   acetaminophen (TYLENOL) 325 MG tablet Take 975 mg by mouth every 8 (eight) hours as  needed.   albuterol (VENTOLIN HFA) 108 (90 Base) MCG/ACT inhaler INHALE 2 PUFFS BY MOUTH EVERY 4 HOURS AS NEEDED FOR WHEEZING OR SHORTNESS OF BREATH (FOR  RESCUE)   Alpha-Lipoic Acid 600 MG CAPS Take 1 capsule (600 mg total) by mouth daily. For diabetic neuropathy   aspirin EC 325 MG tablet Take 325 mg by mouth daily.   Blood Glucose Monitoring Suppl (ONETOUCH VERIO) w/Device KIT Use to test blood sugar twice daily as directed; DX: E11.69   Continuous Glucose Sensor (FREESTYLE LIBRE 3 PLUS SENSOR) MISC Change sensor every 15 days. DX: E11.65   furosemide (LASIX) 40 MG tablet TAKE 1 TABLET BY MOUTH ONCE DAILY AS NEEDED FOR EDEMA OR FLUID   gabapentin (NEURONTIN) 600 MG tablet TAKE 1 & 1/2 (ONE & ONE-HALF) TABLETS BY MOUTH THREE TIMES DAILY   glucose blood (ONETOUCH VERIO) test strip Use to test blood sugar twice daily as directed; DX: E11.69   Insulin Glargine (BASAGLAR KWIKPEN) 100 UNIT/ML Inject 45-50 Units into the skin at bedtime. May increase up to 50 units if instructed by your provider.   Insulin Pen Needle 29G X MISC Use to inject insulin daily as directed. DX:E11.65; please profile for future needs   methocarbamol (ROBAXIN) 500 MG tablet Take 1 tablet (500 mg total) by mouth every 6 (six) hours as needed for muscle spasms.   metoprolol tartrate (LOPRESSOR) 25 MG  tablet Take 1 tablet (25 mg total) by mouth 2 (two) times daily.   nitroGLYCERIN (NITROSTAT) 0.4 MG SL tablet Place 1 tablet (0.4 mg total) under the tongue every 5 (five) minutes x 3 doses as needed for chest pain (if no relief after 3rd dose, proceed to ED or call 911).   omeprazole (PRILOSEC) 40 MG capsule Take 1 capsule (40 mg total) by mouth daily. For heartburn   OneTouch Delica Lancets 30G MISC Use to test blood sugar twice daily as directed; DX: E11.69   oxybutynin (DITROPAN XL) 5 MG 24 hr tablet Take 1 tablet (5 mg total) by mouth at bedtime. For bladder   rosuvastatin (CRESTOR) 20 MG tablet Take 1 tablet (20 mg total) by mouth at bedtime.   Semaglutide 14 MG TABS Take 14 mg by mouth daily.   sertraline (ZOLOFT) 50 MG tablet Take 1 tablet (50 mg total) by mouth daily. Daily for depression/ anxiety   telmisartan (MICARDIS) 40 MG tablet Take 1 tablet (40 mg total) by mouth daily.   Vitamin D, Ergocalciferol, (DRISDOL) 1.25 MG (50000 UNIT) CAPS capsule TAKE 1 CAPSULE BY MOUTH ONCE A WEEK, THEN TAKE OTC VITAMIN D 800IU DAILY   No facility-administered encounter medications on file as of 11/03/2023.    Allergies  Allergen Reactions   Duloxetine    Farxiga [Dapagliflozin]     RECURRENT YEAST INFECTIONS   Morphine Nausea And Vomiting   Trulicity [Dulaglutide]     GI adverse effects (okay on Rybelsus)    Pertinent ROS per HPI, otherwise unremarkable      Objective:  BP 119/81   Pulse 78   Temp 97.9 F (36.6 C) (Temporal)   Ht 5\' 2"  (1.575 m)   Wt 283 lb (128.4 kg)   SpO2 98%   BMI 51.76 kg/m    Wt Readings from Last 3 Encounters:  11/03/23 283 lb (128.4 kg)  10/21/23 299 lb (135.6 kg)  06/09/23 289 lb (131.1 kg)    Physical Exam Vitals and nursing note reviewed.  Constitutional:      Appearance: Normal appearance. She is  morbidly obese.  HENT:     Head: Normocephalic and atraumatic.     Nose: Nose normal.     Mouth/Throat:     Mouth: Mucous membranes are moist.   Eyes:     Pupils: Pupils are equal, round, and reactive to light.  Cardiovascular:     Rate and Rhythm: Normal rate and regular rhythm.     Heart sounds: Normal heart sounds.  Pulmonary:     Effort: Pulmonary effort is normal.     Breath sounds: No rales.  Musculoskeletal:     Right lower leg: Edema present.     Left lower leg: Edema present.  Skin:    General: Skin is warm and dry.     Capillary Refill: Capillary refill takes less than 2 seconds.     Comments: BLE swelling with mild anterior erythema. No increased warmth or weeping. No open wounds. Healed wound to left anterior leg, images below.   Neurological:     General: No focal deficit present.     Mental Status: She is alert and oriented to person, place, and time.     Gait: Gait abnormal (using cane).  Psychiatric:        Mood and Affect: Mood normal.        Behavior: Behavior normal. Behavior is cooperative.        Thought Content: Thought content normal.        Judgment: Judgment normal.           Results for orders placed or performed during the hospital encounter of 06/13/23  Glucose, capillary   Collection Time: 06/13/23  6:50 AM  Result Value Ref Range   Glucose-Capillary 167 (H) 70 - 99 mg/dL  Glucose, capillary   Collection Time: 06/13/23  8:52 AM  Result Value Ref Range   Glucose-Capillary 136 (H) 70 - 99 mg/dL       Pertinent labs & imaging results that were available during my care of the patient were reviewed by me and considered in my medical decision making.  Assessment & Plan:  Amiee was seen today for edema.  Diagnoses and all orders for this visit:  Lymphedema  Bilateral leg pain     Assessment and Plan    Lymphedema Chronic lymphedema with leg swelling and pain. Referral to home health and wound clinic was made on October 21, 2023, but home health services have not initiated twice-weekly wrapping. The patient has lymphatic boots but struggles with application. Discussed  benefits of regular use of lymphatic boots and Una boots. No signs of infection. - Send message to [facility] to follow up on home health referral - Take pictures of legs for the chart - Schedule rewrapping of legs for Monday or Tuesday  Peripheral Edema Persistent leg swelling, likely secondary to lymphedema. No weeping from legs but reports pain and mild dyspnea with activity. Currently on daily Lasix (furosemide). Discussed increasing Lasix dosage and monitoring for improvement. - Increase Lasix to two pills every morning for three days - Monitor for improvement in swelling - Follow up if no improvement  Shortness of Breath Mild dyspnea with activity. No new symptoms or rales on lung exam. Discussed monitoring respiratory symptoms and reassessing next visit. - Monitor respiratory symptoms - Reassess during next visit.          Continue all other maintenance medications.  Follow up plan: Return in about 5 days (around 11/08/2023), or if symptoms worsen or fail to improve, for [employer].   Continue  healthy lifestyle choices, including diet (rich in fruits, vegetables, and lean proteins, and low in salt and simple carbohydrates) and exercise (at least 30 minutes of moderate physical activity daily).  Educational handout given for lymphedema   The above assessment and management plan was discussed with the patient. The patient verbalized understanding of and has agreed to the management plan. Patient is aware to call the clinic if they develop any new symptoms or if symptoms persist or worsen. Patient is aware when to return to the clinic for a follow-up visit. Patient educated on when it is appropriate to go to the emergency department.   Kari Baars, FNP-C [facility] [phone]

## 2023-11-04 NOTE — Progress Notes (Addendum)
Pharmacy Medication Assistance Program Note    12/02/2023  Patient ID: [name], female  DOB: 1961/10/14, 62 y.o.  MRN:  [id number]     09/28/2023 11/04/2023  Outreach Medication Two  Manufacturer Medication Two Novo Nordisk   Nordisk Drugs Rybelsus   Dose of Ryblesus 14MG    Type of Radiographer, therapeutic Assistance   Date Application Sent to Patient 09/28/2023   Date Application Sent to Prescriber 11/04/2023   Name of Prescriber Delynn Flavin   Date Application Received From Patient  10/25/2023  Application Items Received From Patient  Application  Method Application Sent to Manufacturer Fax   Date Application Submitted to Manufacturer 11/15/2023   Patient Assistance Determination Approved      Shipment processing and should arrive in 10-14 business days.

## 2023-11-07 ENCOUNTER — Encounter: Payer: Self-pay | Admitting: Family Medicine

## 2023-11-07 ENCOUNTER — Ambulatory Visit (INDEPENDENT_AMBULATORY_CARE_PROVIDER_SITE_OTHER): Payer: PPO | Admitting: Family Medicine

## 2023-11-07 VITALS — BP 136/78 | HR 69 | Temp 98.3°F | Ht 62.0 in | Wt 295.0 lb

## 2023-11-07 DIAGNOSIS — I890 Lymphedema, not elsewhere classified: Secondary | ICD-10-CM | POA: Diagnosis not present

## 2023-11-07 NOTE — Progress Notes (Signed)
Subjective: [id number] PCP: Stacey Ip, DO WJX:BJYNW [name] is a 62 y.o. female presenting to clinic today for:  1. Lymphedema Her for follow up leg wrap. Was seen 12/13 for same. Only kept wraps on for 1 day due to having a urinary incontinence which required removal.  Overall legs are looking a little bit better than they had when she came in.  She still has not heard from Dr. Audrie Lia office for an appointment yet.   ROS: Per HPI  Allergies  Allergen Reactions   Duloxetine    Farxiga [Dapagliflozin]     RECURRENT YEAST INFECTIONS   Morphine Nausea And Vomiting   Trulicity [Dulaglutide]     GI adverse effects (okay on Rybelsus)   Past Medical History:  Diagnosis Date   Arthritis    CAD (coronary artery disease)    BMS to circumflex 2007 - Dr. Juanda Chance   CKD (chronic kidney disease) stage 3, GFR 30-59 ml/min (HCC)    COPD (chronic obstructive pulmonary disease) (HCC)    Depression    Dyspnea    Essential hypertension    Falls frequently    GERD (gastroesophageal reflux disease)    HA (headache)    Hyperlipidemia    Hyperlipidemia associated with type 2 diabetes mellitus (HCC) 02/03/2011   Left-sided face pain    Morbid obesity (HCC)    Noncompliance    OSA (obstructive sleep apnea)    Uses CPAP   Type 2 diabetes mellitus (HCC)    Urinary incontinence    Urinary incontinence     Current Outpatient Medications:    acetaminophen (TYLENOL) 325 MG tablet, Take 975 mg by mouth every 8 (eight) hours as needed., Disp: , Rfl:    albuterol (VENTOLIN HFA) 108 (90 Base) MCG/ACT inhaler, INHALE 2 PUFFS BY MOUTH EVERY 4 HOURS AS NEEDED FOR WHEEZING OR SHORTNESS OF BREATH (FOR  RESCUE), Disp: 9 g, Rfl: 0   Alpha-Lipoic Acid 600 MG CAPS, Take 1 capsule (600 mg total) by mouth daily. For diabetic neuropathy, Disp: 100 capsule, Rfl: 3   aspirin EC 325 MG tablet, Take 325 mg by mouth daily., Disp: , Rfl:    Blood Glucose Monitoring Suppl (ONETOUCH VERIO) w/Device KIT, Use  to test blood sugar twice daily as directed; DX: E11.69, Disp: 1 kit, Rfl: 1   Continuous Glucose Sensor (FREESTYLE LIBRE 3 PLUS SENSOR) MISC, Change sensor every 15 days. DX: E11.65, Disp: 2 each, Rfl: 11   furosemide (LASIX) 40 MG tablet, TAKE 1 TABLET BY MOUTH ONCE DAILY AS NEEDED FOR EDEMA OR FLUID, Disp: 90 tablet, Rfl: 3   gabapentin (NEURONTIN) 600 MG tablet, TAKE 1 & 1/2 (ONE & ONE-HALF) TABLETS BY MOUTH THREE TIMES DAILY, Disp: 270 tablet, Rfl: 3   glucose blood (ONETOUCH VERIO) test strip, Use to test blood sugar twice daily as directed; DX: E11.69, Disp: 100 each, Rfl: 5   Insulin Glargine (BASAGLAR KWIKPEN) 100 UNIT/ML, Inject 45-50 Units into the skin at bedtime. May increase up to 50 units if instructed by your provider., Disp: 45 mL, Rfl: 5   Insulin Pen Needle 29G X MISC, Use to inject insulin daily as directed. DX:E11.65; please profile for future needs, Disp: 300 each, Rfl: 5   methocarbamol (ROBAXIN) 500 MG tablet, Take 1 tablet (500 mg total) by mouth every 6 (six) hours as needed for muscle spasms., Disp: 120 tablet, Rfl: 1   metoprolol tartrate (LOPRESSOR) 25 MG tablet, Take 1 tablet (25 mg total) by mouth 2 (two)  times daily., Disp: 180 tablet, Rfl: 2   nitroGLYCERIN (NITROSTAT) 0.4 MG SL tablet, Place 1 tablet (0.4 mg total) under the tongue every 5 (five) minutes x 3 doses as needed for chest pain (if no relief after 3rd dose, proceed to ED or call 911)., Disp: 25 tablet, Rfl: 3   omeprazole (PRILOSEC) 40 MG capsule, Take 1 capsule (40 mg total) by mouth daily. For heartburn, Disp: 90 capsule, Rfl: 3   OneTouch Delica Lancets 30G MISC, Use to test blood sugar twice daily as directed; DX: E11.69, Disp: 100 each, Rfl: 3   oxybutynin (DITROPAN XL) 5 MG 24 hr tablet, Take 1 tablet (5 mg total) by mouth at bedtime. For bladder, Disp: 90 tablet, Rfl: 3   rosuvastatin (CRESTOR) 20 MG tablet, Take 1 tablet (20 mg total) by mouth at bedtime., Disp: 90 tablet, Rfl: 3   Semaglutide 14  MG TABS, Take 14 mg by mouth daily., Disp: , Rfl:    sertraline (ZOLOFT) 50 MG tablet, Take 1 tablet (50 mg total) by mouth daily. Daily for depression/ anxiety, Disp: 90 tablet, Rfl: 3   telmisartan (MICARDIS) 40 MG tablet, Take 1 tablet (40 mg total) by mouth daily., Disp: 90 tablet, Rfl: 3   Vitamin D, Ergocalciferol, (DRISDOL) 1.25 MG (50000 UNIT) CAPS capsule, TAKE 1 CAPSULE BY MOUTH ONCE A WEEK, THEN TAKE OTC VITAMIN D 800IU DAILY, Disp: 5 capsule, Rfl: 0 Social History   Socioeconomic History   Marital status: Married    Spouse name: [name]   Number of children: 5   Years of education: 10 th   Highest education level: 10th grade  Occupational History   Occupation: disabled     Comment: disabled  Tobacco Use   Smoking status: Every Day    Current packs/day: 1.00    Average packs/day: 1 pack/day for 51.5 years (51.5 ttl pk-yrs)    Types: Cigarettes    Start date: 05/14/1972    Passive exposure: Current   Smokeless tobacco: Never   Tobacco comments:    Smoking Cessation Classes, Services, Agencies & Resources Offered.  Vaping Use   Vaping status: Never Used  Substance and Sexual Activity   Alcohol use: No    Alcohol/week: 0.0 standard drinks of alcohol   Drug use: No   Sexual activity: Not Currently    Partners: Male    Birth control/protection: Surgical  Other Topics Concern   Not on file  Social History Narrative   Patient lives at home with her husband. Patient is disabled.   Children live nearby   Patient has 10 th grade education.   Right handed.   Caffeine- one  cup of coffee and  One soda Dr.Pepper/ tea. daily   Social Drivers of Health   Financial Resource Strain: Medium Risk (10/13/2023)   Overall Financial Resource Strain (CARDIA)    Difficulty of Paying Living Expenses: Somewhat hard  Food Insecurity: No Food Insecurity (10/13/2023)   Hunger Vital Sign    Worried About Running Out of Food in the Last Year: Never true    Ran Out of Food in the Last Year:  Never true  Transportation Needs: No Transportation Needs (10/13/2023)   PRAPARE - Administrator, Civil Service (Medical): No    Lack of Transportation (Non-Medical): No  Physical Activity: Inactive (10/13/2023)   Exercise Vital Sign    Days of Exercise per Week: 0 days    Minutes of Exercise per Session: 0 min  Stress: No Stress Concern  Present (10/13/2023)   [employer] of Occupational Health - Occupational Stress Questionnaire    Feeling of Stress : Only a little  Social Connections: Moderately Integrated (10/13/2023)   Social Connection and Isolation Panel [NHANES]    Frequency of Communication with Friends and Family: More than three times a week    Frequency of Social Gatherings with Friends and Family: More than three times a week    Attends Religious Services: More than 4 times per year    Active Member of [employer] or Organizations: No    Attends Banker Meetings: Never    Marital Status: Married  Catering manager Violence: Not At Risk (10/13/2023)   Humiliation, Afraid, Rape, and Kick questionnaire    Fear of Current or Ex-Partner: No    Emotionally Abused: No    Physically Abused: No    Sexually Abused: No   Family History  Problem Relation Age of Onset   Breast cancer Mother    Cancer Father    Coronary artery disease Father    Cancer - Colon Father    Diabetes Father    High Cholesterol Father    Arthritis Sister    Asthma Sister    High Cholesterol Daughter    Thyroid disease Daughter    Hiatal hernia Son    Congenital heart disease Son     Objective: Office vital signs reviewed. BP 136/78   Pulse 69   Temp 98.3 F (36.8 C)   Ht 5\' 2"  (1.575 m)   Wt 295 lb (133.8 kg)   SpO2 99%   BMI 53.96 kg/m   Physical Examination:  General: Awake, alert, morbidly obese, No acute distress Extremities: Lymphedema present with minimal erythema at the bases.  No skin breakdown or weeping.  No induration  Assessment/ Plan: 62 y.o. female    Lymphedema  I reached out to scheduling/referrals and she has an appoint with Dr. Lajoyce Corners on 11/10/2023 at 2 PM.  I was unable to reach her at her home phone number so left a voicemail on her daughter's, Ms Ahmed, phone number.  May consider canceling Friday appointment if they rewrap her legs on Thursday at the orthopedist office.  Today we rewrapped her legs with Unna boots.   Stacey Ip, DO [facility] [phone]

## 2023-11-10 ENCOUNTER — Encounter: Payer: Self-pay | Admitting: Orthopedic Surgery

## 2023-11-10 ENCOUNTER — Ambulatory Visit (INDEPENDENT_AMBULATORY_CARE_PROVIDER_SITE_OTHER): Payer: PPO | Admitting: Orthopedic Surgery

## 2023-11-10 DIAGNOSIS — I890 Lymphedema, not elsewhere classified: Secondary | ICD-10-CM

## 2023-11-10 NOTE — Telephone Encounter (Signed)
 Closing call - pt was seen this week concerns addressed in office

## 2023-11-10 NOTE — Progress Notes (Signed)
 Office Visit Note   Patient: [name]           Date of Birth: 1960/12/17           MRN: [id number] Visit Date: 11/10/2023              Requested by: Jolinda Norene HERO, DO [address] [place],  [place] [address] PCP: Jolinda Norene HERO, DO  Chief Complaint  Patient presents with   Left Leg - Edema   Right Leg - Edema      HPI: Patient is a 63 year old woman who is seen for initial evaluation for lymphedema both lower extremities.  Patient states she is on Lasix  40 mg a day.  Patient states she does have lymphedema pumps but has not used them because they are too hard to get on.  Patient has used multilayer compression on both legs but has to remove these shortly after they are placed secondary to incontinence.  Patient is currently on insulin  and oral medication and has a continuous glucose monitor.  Patient states that she has to sit most of the day due to lower back issues and knee issues.  Assessment & Plan: Visit Diagnoses: No diagnosis found.  Plan: Recommended going to the gym for recumbent bike exercises that should protect her back and her knees.  Recommended lymphedema pumps daily.  Recommended elevation of her foot above her heart when she is sitting.  Once her calf circumference gets down to about 50 she can begin wearing compression socks.  Continue multilayer compression.  Follow-Up Instructions: No follow-ups on file.   Ortho Exam  Patient is alert, oriented, no adenopathy, well-dressed, normal affect, normal respiratory effort. Examination patient has lymphedema both lower extremities.  Her right ankle is 38 cm in circumference left ankle 37 cm in circumference.  Right calf is 61 cm in circumference and the left calf is 58 cm in circumference.  Right knee is 42 cm in circumference and left knee is 63 cm in circumference.  Reinforced the importance of elevation and compression and exercise.  Imaging: No results found. No images are attached to the  encounter.  Labs: Lab Results  Component Value Date   HGBA1C 7.9 (H) 06/09/2023   HGBA1C 6.9 (H) 03/21/2023   HGBA1C 6.7 (H) 08/09/2022   ESRSEDRATE 12 08/17/2009   CRP 18.9 (H) 11/19/2016   CRP 0.3 (L) 08/17/2009   REPTSTATUS 05/03/2014 FINAL 04/30/2014   GRAMSTAIN  04/30/2014    NO WBC SEEN NO SQUAMOUS EPITHELIAL CELLS SEEN NO ORGANISMS SEEN Performed at [facility]   CULT  04/30/2014    MODERATE PSEUDOMONAS AERUGINOSA Performed at [facility] PSEUDOMONAS AERUGINOSA 04/30/2014     Lab Results  Component Value Date   ALBUMIN 4.2 05/24/2023   ALBUMIN 4.2 03/21/2023   ALBUMIN 3.9 06/09/2022    Lab Results  Component Value Date   MG 1.7 06/12/2021   MG 2.0 06/19/2020   MG 2.5 01/15/2009   Lab Results  Component Value Date   VD25OH 23.9 (L) 05/24/2023   VD25OH 5.1 (L) 03/21/2023    No results found for: PREALBUMIN    Latest Ref Rng & Units 06/09/2023   11:39 AM 03/21/2023   11:49 AM 08/09/2022    9:56 AM  CBC EXTENDED  WBC 4.0 - 10.5 K/uL 7.9  8.0  7.5   RBC 3.87 - 5.11 MIL/uL 4.12  4.31  4.54   Hemoglobin 12.0 - 15.0 g/dL 13.0  13.4  14.0   HCT 36.0 - 46.0 % 39.6  41.0  41.9   Platelets 150 - 400 K/uL 152  102  CANCELED   NEUT# 1.4 - 7.0 x10E3/uL   3.5   Lymph# 0.7 - 3.1 x10E3/uL   3.0      There is no height or weight on file to calculate BMI.  Orders:  No orders of the defined types were placed in this encounter.  No orders of the defined types were placed in this encounter.    Procedures: No procedures performed  Clinical Data: No additional findings.  ROS:  All other systems negative, except as noted in the HPI. Review of Systems  Objective: Vital Signs: There were no vitals taken for this visit.  Specialty Comments:  No specialty comments available.  PMFS History: Patient Active Problem List   Diagnosis Date Noted   Bilateral renal cysts 08/06/2022   Unstable angina (HCC) 06/18/2020   Low back pain  04/11/2019   Moderate nonproliferative diabetic retinopathy of both eyes without macular edema associated with type 2 diabetes mellitus (HCC) 01/05/2019   Hypertension associated with diabetes (HCC) 09/11/2015   Spinal stenosis, lumbar region, with neurogenic claudication 04/25/2015   Arthritis associated with diabetes (HCC) 08/28/2014   Peripheral edema 08/28/2014   Diabetic neuropathy (HCC) 02/18/2014   External bleeding hemorrhoids 04/10/2013   Morbid (severe) obesity due to excess calories (HCC) 04/09/2013   Diabetes (HCC) 04/09/2013   Thrombocytopenia, unspecified (HCC) 04/09/2013   OSA (obstructive sleep apnea) 07/06/2011   DOE (dyspnea on exertion) 06/06/2011   Coronary artery disease 02/03/2011   GERD (gastroesophageal reflux disease) 02/03/2011   Noncompliance 02/03/2011   Generalized anxiety disorder 02/03/2011   Insomnia 02/03/2011   Dyslipidemia due to type 2 diabetes mellitus (HCC) 02/03/2011   Tobacco user 02/03/2011   Past Medical History:  Diagnosis Date   Arthritis    CAD (coronary artery disease)    BMS to circumflex 2007 - Dr. Obie   CKD (chronic kidney disease) stage 3, GFR 30-59 ml/min (HCC)    COPD (chronic obstructive pulmonary disease) (HCC)    Depression    Dyspnea    Essential hypertension    Falls frequently    GERD (gastroesophageal reflux disease)    HA (headache)    Hyperlipidemia    Hyperlipidemia associated with type 2 diabetes mellitus (HCC) 02/03/2011   Left-sided face pain    Morbid obesity (HCC)    Noncompliance    OSA (obstructive sleep apnea)    Uses CPAP   Type 2 diabetes mellitus (HCC)    Urinary incontinence    Urinary incontinence     Family History  Problem Relation Age of Onset   Breast cancer Mother    Cancer Father    Coronary artery disease Father    Cancer - Colon Father    Diabetes Father    High Cholesterol Father    Arthritis Sister    Asthma Sister    High Cholesterol Daughter    Thyroid  disease Daughter     Hiatal hernia Son    Congenital heart disease Son     Past Surgical History:  Procedure Laterality Date   ABDOMINAL HERNIA REPAIR     ABDOMINAL HYSTERECTOMY     BACK SURGERY     BREAST SURGERY     biopsy per right breast; pt states has silver piece in for marking    CARPAL TUNNEL RELEASE Left 06/13/2023   Procedure: LEFT CARPAL TUNNEL RELEASE;  Surgeon:  Onesimo Oneil LABOR, MD;  Location: [facility];  Service: Orthopedics;  Laterality: Left;   Cataract surgery Bilateral    CHOLECYSTECTOMY     COLONOSCOPY N/A 04/10/2013   Procedure: COLONOSCOPY;  Surgeon: Claudis RAYMOND Rivet, MD;  Location: [facility];  Service: Endoscopy;  Laterality: N/A;   CORONARY ANGIOPLASTY WITH STENT PLACEMENT  2012   HERNIA REPAIR     LEFT HEART CATH AND CORONARY ANGIOGRAPHY N/A 06/19/2020   Procedure: LEFT HEART CATH AND CORONARY ANGIOGRAPHY;  Surgeon: Claudene Victory ORN, MD;  Location: [facility];  Service: Cardiovascular;  Laterality: N/A;   LUMBAR LAMINECTOMY/DECOMPRESSION MICRODISCECTOMY N/A 04/25/2015   Procedure: CENTRAL LUMBAR /DECOMPRESSION L4-L5;  Surgeon: Tanda Heading, MD;  Location: [facility];  Service: Orthopedics;  Laterality: N/A;   TUBAL LIGATION     Social History   Occupational History   Occupation: disabled     Comment: disabled  Tobacco Use   Smoking status: Every Day    Current packs/day: 1.00    Average packs/day: 1 pack/day for 51.5 years (51.5 ttl pk-yrs)    Types: Cigarettes    Start date: 05/14/1972    Passive exposure: Current   Smokeless tobacco: Never   Tobacco comments:    Smoking Cessation Classes, Services, Agencies & Resources Offered.  Vaping Use   Vaping status: Never Used  Substance and Sexual Activity   Alcohol  use: No    Alcohol /week: 0.0 standard drinks of alcohol    Drug use: No   Sexual activity: Not Currently    Partners: Male    Birth control/protection: Surgical

## 2023-11-11 ENCOUNTER — Ambulatory Visit: Payer: PPO | Admitting: Family Medicine

## 2023-11-15 ENCOUNTER — Other Ambulatory Visit (HOSPITAL_COMMUNITY): Payer: Self-pay

## 2023-11-21 ENCOUNTER — Other Ambulatory Visit: Payer: Self-pay | Admitting: Family Medicine

## 2023-11-21 DIAGNOSIS — E1142 Type 2 diabetes mellitus with diabetic polyneuropathy: Secondary | ICD-10-CM

## 2023-11-29 ENCOUNTER — Telehealth: Payer: Self-pay | Admitting: Family Medicine

## 2023-11-29 NOTE — Telephone Encounter (Signed)
Please see note below.  Copied from CRM [id number]. Topic: Clinical - Prescription Issue >> Nov 29, 2023 11:58 AM Carloyn Manner C wrote: Patient called in stating she received a letter in the mail stating that she is no longer eligible for the medication rybelsus 14mg . She would like to know what needs to happen for her to become eligible again and if not, what would be the next steps. Can you please reach out to her regarding this issue at [phone]. Thank you.

## 2023-11-30 ENCOUNTER — Telehealth: Payer: Self-pay

## 2023-11-30 NOTE — Telephone Encounter (Signed)
Copied from CRM [id number]. Topic: Clinical - Prescription Issue >> Nov 30, 2023  9:58 AM Maxwell Marion wrote: Reason for CRM: Patient called because she said she has not received a call back from anyone yet regarding her medication Rybelsus, received a letter stating she is no longer eligible. Would like a call back from Mrs. Raynelle Fanning whenever she is back in office

## 2023-12-08 ENCOUNTER — Ambulatory Visit: Payer: No Typology Code available for payment source | Admitting: *Deleted

## 2023-12-09 ENCOUNTER — Ambulatory Visit (INDEPENDENT_AMBULATORY_CARE_PROVIDER_SITE_OTHER): Payer: No Typology Code available for payment source | Admitting: Family Medicine

## 2023-12-09 ENCOUNTER — Encounter: Payer: Self-pay | Admitting: Family Medicine

## 2023-12-09 VITALS — BP 113/61 | HR 77 | Temp 98.5°F | Ht 62.0 in | Wt 301.0 lb

## 2023-12-09 DIAGNOSIS — I152 Hypertension secondary to endocrine disorders: Secondary | ICD-10-CM

## 2023-12-09 DIAGNOSIS — Z794 Long term (current) use of insulin: Secondary | ICD-10-CM | POA: Diagnosis not present

## 2023-12-09 DIAGNOSIS — E1159 Type 2 diabetes mellitus with other circulatory complications: Secondary | ICD-10-CM

## 2023-12-09 DIAGNOSIS — E1142 Type 2 diabetes mellitus with diabetic polyneuropathy: Secondary | ICD-10-CM | POA: Diagnosis not present

## 2023-12-09 DIAGNOSIS — I890 Lymphedema, not elsewhere classified: Secondary | ICD-10-CM

## 2023-12-09 DIAGNOSIS — R300 Dysuria: Secondary | ICD-10-CM

## 2023-12-09 LAB — URINALYSIS, ROUTINE W REFLEX MICROSCOPIC
Bilirubin, UA: NEGATIVE
Glucose, UA: NEGATIVE
Ketones, UA: NEGATIVE
Nitrite, UA: NEGATIVE
Protein,UA: NEGATIVE
RBC, UA: NEGATIVE
Specific Gravity, UA: <1.005 — ABNORMAL LOW (ref 1.005–1.030)
Urobilinogen, Ur: 0.2 mg/dL (ref 0.2–1.0)
pH, UA: 5.5 (ref 5.0–7.5)

## 2023-12-09 LAB — BAYER DCA HB A1C WAIVED: HB A1C (BAYER DCA - WAIVED): 6.8 % — ABNORMAL HIGH (ref 4.8–5.6)

## 2023-12-09 LAB — MICROSCOPIC EXAMINATION
Epithelial Cells (non renal): NONE SEEN /[HPF] (ref 0–10)
RBC, Urine: NONE SEEN /[HPF] (ref 0–2)
Renal Epithel, UA: NONE SEEN /[HPF]
Yeast, UA: NONE SEEN

## 2023-12-09 NOTE — Progress Notes (Signed)
Subjective: CC: Lymphedema PCP: [name], DO JYN:WGNFA [name] is a 63 y.o. female presenting to clinic today for:  1.  Lymphedema Patient reports that she saw Dr. Lajoyce Corners and he recommended compression, elevation and continued use of her lymphedema pumps.  She really has not noticed any difference in the lower extremity swelling but she also has not had any weeping.  She finds it difficult to wrap her legs herself but did do so yesterday.  2.  Type 2 diabetes associate with hypertension, hyperlipidemia Compliant with medications.  Rybelsus patient assistance program was just approved.  Almost out of medicines.  Continues to utilize insulin as directed.  No reports of hypoglycemia.  Continues to use CGM for monitoring.  3.  Dysuria Patient reports couple day history of urinary odor and now dysuria.  No hematuria, flank pain or fevers reported   ROS: Per HPI  Allergies  Allergen Reactions   Duloxetine    Farxiga [Dapagliflozin]     RECURRENT YEAST INFECTIONS   Morphine Nausea And Vomiting   Trulicity [Dulaglutide]     GI adverse effects (okay on Rybelsus)   Past Medical History:  Diagnosis Date   Arthritis    CAD (coronary artery disease)    BMS to circumflex 2007 - Dr. Juanda Chance   CKD (chronic kidney disease) stage 3, GFR 30-59 ml/min (HCC)    COPD (chronic obstructive pulmonary disease) (HCC)    Depression    Dyspnea    Essential hypertension    Falls frequently    GERD (gastroesophageal reflux disease)    HA (headache)    Hyperlipidemia    Hyperlipidemia associated with type 2 diabetes mellitus (HCC) 02/03/2011   Left-sided face pain    Morbid obesity (HCC)    Noncompliance    OSA (obstructive sleep apnea)    Uses CPAP   Type 2 diabetes mellitus (HCC)    Urinary incontinence    Urinary incontinence     Current Outpatient Medications:    acetaminophen (TYLENOL) 325 MG tablet, Take 975 mg by mouth every 8 (eight) hours as needed., Disp: , Rfl:     albuterol (VENTOLIN HFA) 108 (90 Base) MCG/ACT inhaler, INHALE 2 PUFFS BY MOUTH EVERY 4 HOURS AS NEEDED FOR WHEEZING OR SHORTNESS OF BREATH (FOR  RESCUE), Disp: 9 g, Rfl: 0   Alpha-Lipoic Acid 600 MG CAPS, Take 1 capsule (600 mg total) by mouth daily. For diabetic neuropathy, Disp: 100 capsule, Rfl: 3   aspirin EC 325 MG tablet, Take 325 mg by mouth daily., Disp: , Rfl:    Blood Glucose Monitoring Suppl (ONETOUCH VERIO) w/Device KIT, Use to test blood sugar twice daily as directed; DX: E11.69, Disp: 1 kit, Rfl: 1   Continuous Glucose Sensor (FREESTYLE LIBRE 3 PLUS SENSOR) MISC, Change sensor every 15 days. DX: E11.65, Disp: 2 each, Rfl: 11   furosemide (LASIX) 40 MG tablet, TAKE 1 TABLET BY MOUTH ONCE DAILY AS NEEDED FOR EDEMA OR FLUID, Disp: 90 tablet, Rfl: 3   gabapentin (NEURONTIN) 600 MG tablet, TAKE 1 & 1/2 (ONE & ONE-HALF) TABLETS BY MOUTH THREE TIMES DAILY, Disp: 270 tablet, Rfl: 0   glucose blood (ONETOUCH VERIO) test strip, Use to test blood sugar twice daily as directed; DX: E11.69, Disp: 100 each, Rfl: 5   Insulin Glargine (BASAGLAR KWIKPEN) 100 UNIT/ML, Inject 45-50 Units into the skin at bedtime. May increase up to 50 units if instructed by your provider., Disp: 45 mL, Rfl: 5   Insulin Pen Needle 29G  X MISC, Use to inject insulin daily as directed. DX:E11.65; please profile for future needs, Disp: 300 each, Rfl: 5   methocarbamol (ROBAXIN) 500 MG tablet, Take 1 tablet (500 mg total) by mouth every 6 (six) hours as needed for muscle spasms., Disp: 120 tablet, Rfl: 1   metoprolol tartrate (LOPRESSOR) 25 MG tablet, Take 1 tablet (25 mg total) by mouth 2 (two) times daily., Disp: 180 tablet, Rfl: 2   nitroGLYCERIN (NITROSTAT) 0.4 MG SL tablet, Place 1 tablet (0.4 mg total) under the tongue every 5 (five) minutes x 3 doses as needed for chest pain (if no relief after 3rd dose, proceed to ED or call 911)., Disp: 25 tablet, Rfl: 3   omeprazole (PRILOSEC) 40 MG capsule, Take 1 capsule (40 mg  total) by mouth daily. For heartburn, Disp: 90 capsule, Rfl: 3   OneTouch Delica Lancets 30G MISC, Use to test blood sugar twice daily as directed; DX: E11.69, Disp: 100 each, Rfl: 3   oxybutynin (DITROPAN XL) 5 MG 24 hr tablet, Take 1 tablet (5 mg total) by mouth at bedtime. For bladder, Disp: 90 tablet, Rfl: 3   rosuvastatin (CRESTOR) 20 MG tablet, Take 1 tablet (20 mg total) by mouth at bedtime., Disp: 90 tablet, Rfl: 3   Semaglutide 14 MG TABS, Take 14 mg by mouth daily., Disp: , Rfl:    sertraline (ZOLOFT) 50 MG tablet, Take 1 tablet (50 mg total) by mouth daily. Daily for depression/ anxiety, Disp: 90 tablet, Rfl: 3   telmisartan (MICARDIS) 40 MG tablet, Take 1 tablet (40 mg total) by mouth daily., Disp: 90 tablet, Rfl: 3   Vitamin D, Ergocalciferol, (DRISDOL) 1.25 MG (50000 UNIT) CAPS capsule, TAKE 1 CAPSULE BY MOUTH ONCE A WEEK, THEN TAKE OTC VITAMIN D 800IU DAILY, Disp: 5 capsule, Rfl: 0 Social History   Socioeconomic History   Marital status: Married    Spouse name: [name]   Number of children: 5   Years of education: 10 th   Highest education level: 10th grade  Occupational History   Occupation: disabled     Comment: disabled  Tobacco Use   Smoking status: Every Day    Current packs/day: 1.00    Average packs/day: 1 pack/day for 51.6 years (51.6 ttl pk-yrs)    Types: Cigarettes    Start date: 05/14/1972    Passive exposure: Current   Smokeless tobacco: Never   Tobacco comments:    Smoking Cessation Classes, Services, Agencies & Resources Offered.  Vaping Use   Vaping status: Never Used  Substance and Sexual Activity   Alcohol use: No    Alcohol/week: 0.0 standard drinks of alcohol   Drug use: No   Sexual activity: Not Currently    Partners: Male    Birth control/protection: Surgical  Other Topics Concern   Not on file  Social History Narrative   Patient lives at home with her husband. Patient is disabled.   Children live nearby   Patient has 10 th grade education.    Right handed.   Caffeine- one  cup of coffee and  One soda Dr.Pepper/ tea. daily   Social Drivers of Health   Financial Resource Strain: Medium Risk (10/13/2023)   Overall Financial Resource Strain (CARDIA)    Difficulty of Paying Living Expenses: Somewhat hard  Food Insecurity: No Food Insecurity (10/13/2023)   Hunger Vital Sign    Worried About Running Out of Food in the Last Year: Never true    Ran Out of Food in  the Last Year: Never true  Transportation Needs: No Transportation Needs (10/13/2023)   PRAPARE - Administrator, Civil Service (Medical): No    Lack of Transportation (Non-Medical): No  Physical Activity: Inactive (10/13/2023)   Exercise Vital Sign    Days of Exercise per Week: 0 days    Minutes of Exercise per Session: 0 min  Stress: No Stress Concern Present (10/13/2023)   [employer] of Occupational Health - Occupational Stress Questionnaire    Feeling of Stress : Only a little  Social Connections: Moderately Integrated (10/13/2023)   Social Connection and Isolation Panel [NHANES]    Frequency of Communication with Friends and Family: More than three times a week    Frequency of Social Gatherings with Friends and Family: More than three times a week    Attends Religious Services: More than 4 times per year    Active Member of [employer] or Organizations: No    Attends Banker Meetings: Never    Marital Status: Married  Catering manager Violence: Not At Risk (10/13/2023)   Humiliation, Afraid, Rape, and Kick questionnaire    Fear of Current or Ex-Partner: No    Emotionally Abused: No    Physically Abused: No    Sexually Abused: No   Family History  Problem Relation Age of Onset   Breast cancer Mother    Cancer Father    Coronary artery disease Father    Cancer - Colon Father    Diabetes Father    High Cholesterol Father    Arthritis Sister    Asthma Sister    High Cholesterol Daughter    Thyroid disease Daughter    Hiatal hernia  Son    Congenital heart disease Son     Objective: Office vital signs reviewed. BP 113/61   Pulse 77   Temp 98.5 F (36.9 C)   Ht 5\' 2"  (1.575 m)   Wt (!) 301 lb (136.5 kg)   SpO2 93%   BMI 55.05 kg/m   Physical Examination:  General: Awake, alert, super morbidly obese, No acute distress HEENT:sclera white, MMM Cardio: regular rate and rhythm, S1S2 heard, no murmurs appreciated Pulm: clear to auscultation bilaterally, no wheezes, rhonchi or rales; normal work of breathing on room air Extremities: Nonweeping lymphedema present.  Ambulating with minimal assistance  Assessment/ Plan: 63 y.o. female   Lymphedema  Dysuria - Plan: Urinalysis, Routine w reflex microscopic, Urine Culture  Type 2 diabetes mellitus with diabetic polyneuropathy, with long-term current use of insulin (HCC) - Plan: Renal Function Panel, Bayer DCA Hb A1c Waived  Hypertension associated with diabetes (HCC) - Plan: Renal Function Panel  Lymphedema is chronic and stable.  These were rewrapped in [employer] today  Urinalysis demonstrated moderate amounts of bacteria but really no other evidence of urinary tract infection or yeast infection.  I am sending it for culture  Check A1c, renal function panel.  Last GFR was below 45.  Blood pressure controlled.  No changes   [name], DO [facility] [phone]

## 2023-12-10 LAB — RENAL FUNCTION PANEL
Albumin: 4.1 g/dL (ref 3.9–4.9)
BUN/Creatinine Ratio: 13 (ref 12–28)
BUN: 21 mg/dL (ref 8–27)
CO2: 23 mmol/L (ref 20–29)
Calcium: 9.1 mg/dL (ref 8.7–10.3)
Chloride: 101 mmol/L (ref 96–106)
Creatinine, Ser: 1.59 mg/dL — ABNORMAL HIGH (ref 0.57–1.00)
Glucose: 79 mg/dL (ref 70–99)
Phosphorus: 3 mg/dL (ref 3.0–4.3)
Potassium: 4.4 mmol/L (ref 3.5–5.2)
Sodium: 140 mmol/L (ref 134–144)
eGFR: 36 mL/min/{1.73_m2} — ABNORMAL LOW (ref >59)

## 2023-12-10 NOTE — Patient Instructions (Addendum)
 Visit Information  Thank you for taking time to visit with me today. Please don't hesitate to contact me if I can be of assistance to you.   Following are the goals we discussed today:   Goals Addressed             This Visit's Progress    RNCM Care Management  (DIABETES) EXPECTED OUTCOME: MONITOR, SELF-MANAGE AND REDUCE SYMPTOMS OF DIABETES   Not on track    Current Barriers:  Knowledge Deficits related to Diabetes, Lymphedema management Chronic Disease Management support and education needs related to Diabetes, diet Financial Constraints.  Patient reports CBG is monitored with Freestyle Libre but now can not afford extra $21 to get supplies from pharmacy until she has met her annual deductible Patient reports she has initial consult for wound care on 04/07/23 due to "a puppy bit me on right leg",  pt is finished with antibiotic, update- pt states she decided not to go to wound center and states " the wound is almost completely healed" Update- home health services for lymphedema lower extremity wrapping is continuing, Patient reports sometimes she only eats one meal per day, depending on how she feels Lab Results  Component Value Date   HGBA1C 6.8 (H) 12/09/2023     Planned Interventions: Provided education to patient about basic DM disease process. The patient states she feels overall she is doing well with managing her DM.  Reviewed medications with patient and discussed importance of medication adherence. The patient is compliant with medications. The patient states she works with the pharm D and she has a follow up appointment next week;        Counseled on importance of regular laboratory monitoring as prescribed. Labs up to date.;        Advised patient, providing education and rationale, to check cbg per CGM  and record. Has a freestyle libre. Does have trouble sometimes with it staying on her arm. Her daughter has been putting a clear bandage over it..        Review of patient  status, including review of consultants reports, relevant laboratory and other test results, and medications completed;       Advised patient to discuss non healing wounds, signs of infection  with provider;      Reviewed signs/ symptoms of infection, reportable signs/ symptoms Reviewed carbohydrate modified diet Reviewed importance of eating 3 well balanced meals per day, preferably at the same time each day  Symptom Management: Take medications as prescribed   Attend all scheduled provider appointments Call pharmacy for medication refills 3-7 days in advance of running out of medications Attend church or other social activities Call provider office for new concerns or questions  Work with the social worker to address care coordination needs and will continue to work with the clinical team to address health care and disease management related needs check blood sugar at prescribed times: [name]  check feet daily for cuts, sores or redness enter blood sugar readings and medication or insulin into daily log take the blood sugar log to all doctor visits take the blood sugar meter to all doctor visits trim toenails straight across limit fast food meals to no more than 1 per week manage portion size prepare main meal at home 3 to 5 days each week read food labels for fat, fiber, carbohydrates and portion size set a realistic goal keep feet up while sitting wash and dry feet carefully every day wear comfortable, cotton socks Call  your doctor for signs of skin infection/ non healing- redness, foul smelling drainage, warmth, fever, if overall you are not feeling well Try to eat 3 meals per day around the same time each day Interventions Today    Flowsheet Row Most Recent Value  Chronic Disease   Chronic disease during today's visit Diabetes, Other  [home health, mobility, medicines, glucometer vs Freestyle (cost) annual year deductible]  General Interventions   General  Interventions Discussed/Reviewed General Interventions Reviewed, Doctor Visits, [facility], Horticulturist, commercial (DME)  Doctor Visits Discussed/Reviewed Doctor Visits Reviewed, PCP  Durable Medical Equipment (DME) Glucomoter  PCP/Specialist Visits Compliance with follow-up visit  Education Interventions   Education Provided Provided Education  [employer] deductible, [employer] energy savings plan for electricity]  Provided Verbal Education On Development worker, community, [employer]  Mental Health Interventions   Mental Health Discussed/Reviewed Mental Health Reviewed, Coping Strategies       Follow Up Plan: Telephone follow up appointment with care management team member scheduled for:           Our next appointment is by telephone on 12/12/23 at 1130  Please call the care guide team at [phone] if you need to cancel or reschedule your appointment.   If you are experiencing a Mental Health or Behavioral Health Crisis or need someone to talk to, please call the Suicide and Crisis Lifeline: 988 call the [place] National Suicide Prevention Lifeline: 509-539-4679 or TTY: 956-272-9696 TTY 952-757-5192) to talk to a trained counselor call 1-800-273-TALK (toll free, 24 hour hotline) call the [facility]: [phone] call 911   No computer access, no preference for copy of AVS     The patient has been provided with contact information for the care management team and has been advised to call with any health related questions or concerns.   Stacey Bartolomei L. Noelle Penner, RN, BSN, CCM [facility], [facility] RN Care Manager Direct Dial: [phone]  Fax: [phone] Mailing Address: [address]  [place] [address] Website: [email or web address]

## 2023-12-12 ENCOUNTER — Ambulatory Visit: Payer: Self-pay | Admitting: *Deleted

## 2023-12-12 NOTE — Patient Outreach (Signed)
Care Coordination   Follow Up Visit Note   12/12/2023 Name: [name] MRN: [id number] DOB: June 14, 1961  [name] is a 63 y.o. year old female who sees [name], DO for primary care. I spoke with  [name] by phone today.  What matters to the patients health and wellness today?  Freestyle not able to use related to 2025 insurance change so   Using her one touch verio glucose meter  Glucose meter- states she can not afford the cost of supplies for her Freestyle for 2025 under her new insurance coverage (devoted). She does prefer her Augustin Coupe vs her One touch verio  She voiced understanding that when she had HTA coverage her home health referral was not covered & her services at [facility] were ordered to help   Patient states her pcp discussed her going to a [place] provider to help legs wrap twice/Week She reports her daughter can not take her twice a week  [facility]- reports she is no longer eligible per her new insurance coverage She voiced understanding that she is eligible for a one time coverage change prior to March 2025   Next appointment - 03/20/24 with pcp 12/30/23 annual wellness outreach   Goals Addressed             This Visit's Progress    RNCM Care Management  (DIABETES) EXPECTED OUTCOME: MONITOR, SELF-MANAGE AND REDUCE SYMPTOMS OF DIABETES   Not on track    Current Barriers:  Knowledge Deficits related to Diabetes management Chronic Disease Management support and education needs related to Diabetes, diet Financial Constraints.  Patient reports CBG is monitored with Freestyle Libre  Patient reports she has initial consult for wound care on 04/07/23 due to "a puppy bit me on right leg",  pt is finished with antibiotic, update- pt states she decided not to go to wound center and states " the wound is almost completely healed" Update- pt states she is awaiting home health services for lymphedema lower extremity wrapping Patient reports sometimes she  only eats one meal per day, depending on how she feels Lab Results  Component Value Date   HGBA1C 6.8 (H) 12/09/2023     Planned Interventions: Provided education to patient about basic DM disease process. The patient states she feels overall she is doing well with managing her DM. She did have a low early am. The patient states that it does not happen very often. The biggest concern she has right now is swelling in her feet and legs. The patient tried to call the office to get an earlier appointment but could not get one until the end of January. A message sent to staff to see if the patient could be seen earlier. ; Reviewed medications with patient and discussed importance of medication adherence. The patient is compliant with medications. The patient states she works with the pharm D and she has a follow up appointment next week;        Counseled on importance of regular laboratory monitoring as prescribed. Labs up to date.;        Advised patient, providing education and rationale, to check cbg per CGM  and record. Has a freestyle libre. Does have trouble sometimes with it staying on her arm. Her daughter has been putting a clear bandage over it. This am her alarm went off and her blood sugar was 45 it came up to 77 and she has been having good numbers today. Review and education provided.  Review of patient status, including review of consultants reports, relevant laboratory and other test results, and medications completed;       Advised patient to discuss non healing wounds, signs of infection  with provider;      Reviewed signs/ symptoms of infection, reportable signs/ symptoms Reviewed carbohydrate modified diet Reviewed importance of eating 3 well balanced meals per day, preferably at the same time each day  Symptom Management: Take medications as prescribed   Attend all scheduled provider appointments Call pharmacy for medication refills 3-7 days in advance of running out of  medications Attend church or other social activities Call provider office for new concerns or questions  Work with the social worker to address care coordination needs and will continue to work with the clinical team to address health care and disease management related needs check blood sugar at prescribed times: [name]  check feet daily for cuts, sores or redness enter blood sugar readings and medication or insulin into daily log take the blood sugar log to all doctor visits take the blood sugar meter to all doctor visits trim toenails straight across limit fast food meals to no more than 1 per week manage portion size prepare main meal at home 3 to 5 days each week read food labels for fat, fiber, carbohydrates and portion size set a realistic goal keep feet up while sitting wash and dry feet carefully every day wear comfortable, cotton socks Call your doctor for signs of skin infection/ non healing- redness, foul smelling drainage, warmth, fever, if overall you are not feeling well Try to eat 3 meals per day around the same time each day Interventions Today    Flowsheet Row Most Recent Value  Chronic Disease   Chronic disease during today's visit Diabetes, Other, Hypertension (HTN)  General Interventions   General Interventions Discussed/Reviewed General Interventions Reviewed, [employer], Doctor Visits, Horticulturist, commercial (DME)  Doctor Visits Discussed/Reviewed Doctor Visits Reviewed, PCP, Specialist  Durable Medical Equipment (DME) Glucomoter, Other  PCP/Specialist Visits Compliance with follow-up visit  Communication with PCP/Specialists, RN  [consulted with pcp coverage and referral coordinator to see if a new referral for home heath could be ordered using her new devoted coverage for a response]  Exercise Interventions   Exercise Discussed/Reviewed Exercise Discussed, Physical Activity  Physical Activity Discussed/Reviewed Physical Activity Reviewed   Education Interventions   Education Provided Provided Education  Provided Verbal Education On Nutrition, Foot Care, Mental Health/Coping with Illness, Exercise, Medication, [employer], [employer], Other  [1 time coverae change until 02/06/24]  Mental Health Interventions   Mental Health Discussed/Reviewed Mental Health Discussed, Coping Strategies  Nutrition Interventions   Nutrition Discussed/Reviewed Nutrition Discussed, Adding fruits and vegetables, Fluid intake, Portion sizes, Increasing proteins, Decreasing fats, Decreasing salt, Decreasing sugar intake  [discussed protein shakes, bars, OJ vs protein snacks for maintaining cbg value]  Pharmacy Interventions   Pharmacy Dicussed/Reviewed Pharmacy Topics Discussed, Medications and their functions  Safety Interventions   Safety Discussed/Reviewed Safety Discussed, Home Safety  Home Safety Assistive Devices  [support from her spouse]       Follow Up Plan: Telephone follow up appointment with care management team member scheduled for: 12-08-2023 at 230 pm          SDOH assessments and interventions completed:  No     Care Coordination Interventions:  Yes, provided   Follow up plan: Follow up call scheduled for 12/27/23    Encounter Outcome:  Patient Visit Completed   Cala Bradford L. Noelle Penner,  RN, BSN, CCM [place]  [facility], [facility] RN Care Manager Direct Dial: [phone]  Fax: [phone] Mailing Address: [address]  [place] [address] Website: [email or web address]

## 2023-12-12 NOTE — Patient Outreach (Signed)
 Care Coordination   Initial Visit Note   12/12/2023 Name: [name] MRN: [id number] DOB: 1961-02-23  ANTIONETTA [name] is a 63 y.o. year old female who sees [name], DO for primary care. I spoke with  [name] by phone today.  What matters to the patients health and wellness today?  Missing home services for leg wrapping per patient    Goals Addressed             This Visit's Progress    RNCM Care Management  (DIABETES) EXPECTED OUTCOME: MONITOR, SELF-MANAGE AND REDUCE SYMPTOMS OF DIABETES   Not on track    Current Barriers:  Knowledge Deficits related to Diabetes management Chronic Disease Management support and education needs related to Diabetes, diet Financial Constraints.  Patient reports CBG is monitored with Freestyle Libre  Patient reports she has initial consult for wound care on 04/07/23 due to "a puppy bit me on right leg",  pt is finished with antibiotic, update- pt states she decided not to go to wound center and states " the wound is almost completely healed" Update- pt states she is awaiting home health services for lymphedema lower extremity wrapping Patient reports sometimes she only eats one meal per day, depending on how she feels Lab Results  Component Value Date   HGBA1C 6.8 (H) 12/09/2023     Planned Interventions: Provided education to patient about basic DM disease process. The patient states she feels overall she is doing well with managing her DM. She did have a low early am. The patient states that it does not happen very often. The biggest concern she has right now is swelling in her feet and legs. The patient tried to call the office to get an earlier appointment but could not get one until the end of January. A message sent to staff to see if the patient could be seen earlier. ; Reviewed medications with patient and discussed importance of medication adherence. The patient is compliant with medications. The patient states she works with  the pharm D and she has a follow up appointment next week;        Counseled on importance of regular laboratory monitoring as prescribed. Labs up to date.;        Advised patient, providing education and rationale, to check cbg per CGM  and record. Has a freestyle libre. Does have trouble sometimes with it staying on her arm. Her daughter has been putting a clear bandage over it. This am her alarm went off and her blood sugar was 45 it came up to 77 and she has been having good numbers today. Review and education provided.        Review of patient status, including review of consultants reports, relevant laboratory and other test results, and medications completed;       Advised patient to discuss non healing wounds, signs of infection  with provider;      Reviewed signs/ symptoms of infection, reportable signs/ symptoms Reviewed carbohydrate modified diet Reviewed importance of eating 3 well balanced meals per day, preferably at the same time each day  Symptom Management: Take medications as prescribed   Attend all scheduled provider appointments Call pharmacy for medication refills 3-7 days in advance of running out of medications Attend church or other social activities Call provider office for new concerns or questions  Work with the social worker to address care coordination needs and will continue to work with the clinical team to address health  care and disease management related needs check blood sugar at prescribed times: Freestyle Libre  check feet daily for cuts, sores or redness enter blood sugar readings and medication or insulin into daily log take the blood sugar log to all doctor visits take the blood sugar meter to all doctor visits trim toenails straight across limit fast food meals to no more than 1 per week manage portion size prepare main meal at home 3 to 5 days each week read food labels for fat, fiber, carbohydrates and portion size set a realistic goal keep feet up  while sitting wash and dry feet carefully every day wear comfortable, cotton socks Call your doctor for signs of skin infection/ non healing- redness, foul smelling drainage, warmth, fever, if overall you are not feeling well Try to eat 3 meals per day around the same time each day Interventions Today    Flowsheet Row Most Recent Value  Chronic Disease   Chronic disease during today's visit Other  [wrapping legs twice a week]  General Interventions   General Interventions Discussed/Reviewed General Interventions Discussed, Doctor Visits, Communication with  Doctor Visits Discussed/Reviewed Doctor Visits Discussed, PCP, Specialist  Communication with PCP/Specialists, RN  [consulted with pcp referral coordinator in absence of pcp]  Exercise Interventions   Exercise Discussed/Reviewed Physical Activity  Physical Activity Discussed/Reviewed Physical Activity Discussed  Education Interventions   Education Provided Provided Education  Provided Verbal Education On Other, Development worker, community, [employer]  [discussed patient voiced concern that she is not receiving home health to take care of her wounds/lymph edema]       Follow Up Plan: Telephone follow up appointment with care management team member scheduled for: 12-08-2023 at 230 pm          SDOH assessments and interventions completed:  Yes     Care Coordination Interventions:  Yes, provided   Follow up plan: Follow up call scheduled for 12/12/23    Encounter Outcome:  Patient Visit Completed   Cala Bradford L. Noelle Penner, RN, BSN, CCM [employer]  [facility], [facility] RN Care Manager Direct Dial: [phone]  Fax: [phone] Mailing Address: [address]  [place] [address] Website: [email or web address]

## 2023-12-12 NOTE — Patient Instructions (Signed)
 Visit Information  Thank you for taking time to visit with me today. Please don't hesitate to contact me if I can be of assistance to you.   Following are the goals we discussed today:   Goals Addressed             This Visit's Progress    RNCM Care Management  (DIABETES) EXPECTED OUTCOME: MONITOR, SELF-MANAGE AND REDUCE SYMPTOMS OF DIABETES   Not on track    Current Barriers:  Knowledge Deficits related to Diabetes management Chronic Disease Management support and education needs related to Diabetes, diet Financial Constraints.  Patient reports CBG is monitored with Freestyle Libre  Patient reports she has initial consult for wound care on 04/07/23 due to "a puppy bit me on right leg",  pt is finished with antibiotic, update- pt states she decided not to go to wound center and states " the wound is almost completely healed" Update- pt states she is awaiting home health services for lymphedema lower extremity wrapping Patient reports sometimes she only eats one meal per day, depending on how she feels Lab Results  Component Value Date   HGBA1C 6.8 (H) 12/09/2023     Planned Interventions: Provided education to patient about basic DM disease process. The patient states she feels overall she is doing well with managing her DM. She did have a low early am. The patient states that it does not happen very often. The biggest concern she has right now is swelling in her feet and legs. The patient tried to call the office to get an earlier appointment but could not get one until the end of January. A message sent to staff to see if the patient could be seen earlier. ; Reviewed medications with patient and discussed importance of medication adherence. The patient is compliant with medications. The patient states she works with the pharm D and she has a follow up appointment next week;        Counseled on importance of regular laboratory monitoring as prescribed. Labs up to date.;        Advised  patient, providing education and rationale, to check cbg per CGM  and record. Has a freestyle libre. Does have trouble sometimes with it staying on her arm. Her daughter has been putting a clear bandage over it. This am her alarm went off and her blood sugar was 45 it came up to 77 and she has been having good numbers today. Review and education provided.        Review of patient status, including review of consultants reports, relevant laboratory and other test results, and medications completed;       Advised patient to discuss non healing wounds, signs of infection  with provider;      Reviewed signs/ symptoms of infection, reportable signs/ symptoms Reviewed carbohydrate modified diet Reviewed importance of eating 3 well balanced meals per day, preferably at the same time each day  Symptom Management: Take medications as prescribed   Attend all scheduled provider appointments Call pharmacy for medication refills 3-7 days in advance of running out of medications Attend church or other social activities Call provider office for new concerns or questions  Work with the social worker to address care coordination needs and will continue to work with the clinical team to address health care and disease management related needs check blood sugar at prescribed times: [name]  check feet daily for cuts, sores or redness enter blood sugar readings and medication or insulin into  daily log take the blood sugar log to all doctor visits take the blood sugar meter to all doctor visits trim toenails straight across limit fast food meals to no more than 1 per week manage portion size prepare main meal at home 3 to 5 days each week read food labels for fat, fiber, carbohydrates and portion size set a realistic goal keep feet up while sitting wash and dry feet carefully every day wear comfortable, cotton socks Call your doctor for signs of skin infection/ non healing- redness, foul smelling  drainage, warmth, fever, if overall you are not feeling well Try to eat 3 meals per day around the same time each day Interventions Today    Flowsheet Row Most Recent Value  Chronic Disease   Chronic disease during today's visit Diabetes, Other, Hypertension (HTN)  General Interventions   General Interventions Discussed/Reviewed General Interventions Reviewed, [facility], Doctor Visits, Horticulturist, commercial (DME)  Doctor Visits Discussed/Reviewed Doctor Visits Reviewed, PCP, Specialist  Durable Medical Equipment (DME) Glucomoter, Other  PCP/Specialist Visits Compliance with follow-up visit  Communication with PCP/Specialists, RN  [consulted with pcp coverage and referral coordinator to see if a new referral for home heath could be ordered using her new devoted coverage for a response]  Exercise Interventions   Exercise Discussed/Reviewed Exercise Discussed, Physical Activity  Physical Activity Discussed/Reviewed Physical Activity Reviewed  Education Interventions   Education Provided Provided Education  Provided Verbal Education On Nutrition, Foot Care, Mental Health/Coping with Illness, Exercise, Medication, [employer], [employer], Other  [1 time coverae change until 02/06/24]  Mental Health Interventions   Mental Health Discussed/Reviewed Mental Health Discussed, Coping Strategies  Nutrition Interventions   Nutrition Discussed/Reviewed Nutrition Discussed, Adding fruits and vegetables, Fluid intake, Portion sizes, Increasing proteins, Decreasing fats, Decreasing salt, Decreasing sugar intake  [discussed protein shakes, bars, OJ vs protein snacks for maintaining cbg value]  Pharmacy Interventions   Pharmacy Dicussed/Reviewed Pharmacy Topics Discussed, Medications and their functions  Safety Interventions   Safety Discussed/Reviewed Safety Discussed, Home Safety  Home Safety Assistive Devices  [support from her spouse]       Follow Up Plan: Telephone follow  up appointment with care management team member scheduled for: 12-08-2023 at 230 pm          Our next appointment is by telephone on 12/27/23 at 1130  Please call the care guide team at [phone] if you need to cancel or reschedule your appointment.   If you are experiencing a Mental Health or Behavioral Health Crisis or need someone to talk to, please call the Suicide and Crisis Lifeline: 988 call the [place] National Suicide Prevention Lifeline: 810 410 2404 or TTY: 705-886-4422 TTY 224 535 2766) to talk to a trained counselor call 1-800-273-TALK (toll free, 24 hour hotline) call the [facility]: (830)159-9332 call 911   no preference in copy of avs   The patient has been provided with contact information for the care management team and has been advised to call with any health related questions or concerns.   Tasmin Exantus L. Noelle Penner, RN, BSN, CCM [place]  [facility], [facility] RN Care Manager Direct Dial: [phone]  Fax: [phone]

## 2023-12-13 ENCOUNTER — Other Ambulatory Visit: Payer: Self-pay | Admitting: Family Medicine

## 2023-12-13 DIAGNOSIS — N3000 Acute cystitis without hematuria: Secondary | ICD-10-CM

## 2023-12-13 LAB — URINE CULTURE

## 2023-12-13 MED ORDER — CEPHALEXIN 500 MG PO CAPS: 500.0000 mg | ORAL_CAPSULE | Freq: Two times a day (BID) | ORAL | 0 refills | Status: AC

## 2023-12-23 ENCOUNTER — Telehealth: Payer: Self-pay | Admitting: Family Medicine

## 2023-12-23 NOTE — Telephone Encounter (Signed)
Called number listed and it is wrong was going to give verbals.

## 2023-12-23 NOTE — Telephone Encounter (Signed)
Copied from CRM [id number]. Topic: Clinical - Home Health Verbal Orders >> Dec 23, 2023 11:53 AM Antony Haste wrote: Caller/Agency: Clinical Manager at [facility] Callback Number: [phone] Service Requested: Nursing and Physical Therapy Frequency: PT is wanting to wait until 02/18 to begin the services. Manager is requesting verbal confirmation.  Any new concerns about the patient? No

## 2023-12-27 ENCOUNTER — Other Ambulatory Visit: Payer: Self-pay | Admitting: Family Medicine

## 2023-12-27 ENCOUNTER — Ambulatory Visit: Payer: Self-pay | Admitting: *Deleted

## 2023-12-27 DIAGNOSIS — F32A Depression, unspecified: Secondary | ICD-10-CM | POA: Diagnosis not present

## 2023-12-27 DIAGNOSIS — Z9181 History of falling: Secondary | ICD-10-CM | POA: Diagnosis not present

## 2023-12-27 DIAGNOSIS — G4733 Obstructive sleep apnea (adult) (pediatric): Secondary | ICD-10-CM | POA: Diagnosis not present

## 2023-12-27 DIAGNOSIS — E785 Hyperlipidemia, unspecified: Secondary | ICD-10-CM | POA: Diagnosis not present

## 2023-12-27 DIAGNOSIS — J42 Unspecified chronic bronchitis: Secondary | ICD-10-CM

## 2023-12-27 DIAGNOSIS — Z7982 Long term (current) use of aspirin: Secondary | ICD-10-CM | POA: Diagnosis not present

## 2023-12-27 DIAGNOSIS — Z794 Long term (current) use of insulin: Secondary | ICD-10-CM | POA: Diagnosis not present

## 2023-12-27 DIAGNOSIS — E1169 Type 2 diabetes mellitus with other specified complication: Secondary | ICD-10-CM | POA: Diagnosis not present

## 2023-12-27 DIAGNOSIS — I890 Lymphedema, not elsewhere classified: Secondary | ICD-10-CM | POA: Diagnosis not present

## 2023-12-27 DIAGNOSIS — Z5982 Transportation insecurity: Secondary | ICD-10-CM | POA: Diagnosis not present

## 2023-12-27 DIAGNOSIS — Z6841 Body Mass Index (BMI) 40.0 and over, adult: Secondary | ICD-10-CM | POA: Diagnosis not present

## 2023-12-27 DIAGNOSIS — K219 Gastro-esophageal reflux disease without esophagitis: Secondary | ICD-10-CM | POA: Diagnosis not present

## 2023-12-27 DIAGNOSIS — I2510 Atherosclerotic heart disease of native coronary artery without angina pectoris: Secondary | ICD-10-CM | POA: Diagnosis not present

## 2023-12-27 DIAGNOSIS — F17210 Nicotine dependence, cigarettes, uncomplicated: Secondary | ICD-10-CM | POA: Diagnosis not present

## 2023-12-27 NOTE — Patient Outreach (Signed)
Care Coordination   Follow Up Visit Note   12/28/2023 Name: [name] MRN: [id number] DOB: [birth year]  [name] is a 63 y.o. year old female who sees [name], DO for primary care. I spoke with  [name] by phone today.  What matters to the patients health and wellness today?  lymphedema wrapping, new left foot broken skin, urinary symptoms, fall precautions  Pain manageable with muscle relaxant  Home health visited today to wrap leg will return on 01/03/24  Confirms she had last yr eye appointment [facility] cotter reading glasses  Urinary discomfort not burning as bad as it was Took antibiotics, still has odor urine yellow, less itching  Blister on bottom of left foot crack in it Photos taken today Tender like needles sticking in me Site is covered    Goals Addressed             This Visit's Progress    RNCM Care Management  (DIABETES) EXPECTED OUTCOME: MONITOR, SELF-MANAGE AND REDUCE SYMPTOMS OF DIABETES   On track    Current Barriers:  Knowledge Deficits related to Diabetes management Chronic Disease Management support and education needs related to Diabetes, diet Financial Constraints.  Patient reports CBG is monitored with Freestyle Libre  Patient reports she has initial consult for wound care on 04/07/23 due to "a puppy bit me on right leg",  pt is finished with antibiotic, update- pt states she decided not to go to wound center and states " the wound is almost completely healed" Update- pt states she is awaiting home health services for lymphedema lower extremity wrapping Patient reports sometimes she only eats one meal per day, depending on how she feels Lab Results  Component Value Date   HGBA1C 6.8 (H) 12/09/2023     Planned Interventions: Provided education to patient about basic DM disease process. The patient states she feels overall she is doing well with managing her DM. She did have a low early am. The patient states that it does not  happen very often. The biggest concern she has right now is swelling in her feet and legs. The patient tried to call the office to get an earlier appointment but could not get one until the end of January. A message sent to staff to see if the patient could be seen earlier. ; Reviewed medications with patient and discussed importance of medication adherence. The patient is compliant with medications. The patient states she works with the pharm D and she has a follow up appointment next week;        Counseled on importance of regular laboratory monitoring as prescribed. Labs up to date.;        Advised patient, providing education and rationale, to check cbg per CGM  and record. Has a freestyle libre. Does have trouble sometimes with it staying on her arm. Her daughter has been putting a clear bandage over it. This am her alarm went off and her blood sugar was 45 it came up to 77 and she has been having good numbers today. Review and education provided.        Review of patient status, including review of consultants reports, relevant laboratory and other test results, and medications completed;       Advised patient to discuss non healing wounds, signs of infection  with provider;      Reviewed signs/ symptoms of infection, reportable signs/ symptoms Reviewed carbohydrate modified diet Reviewed importance of eating 3 well balanced  meals per day, preferably at the same time each day  Symptom Management: Take medications as prescribed   Attend all scheduled provider appointments Call pharmacy for medication refills 3-7 days in advance of running out of medications Attend church or other social activities Call provider office for new concerns or questions  Work with the social worker to address care coordination needs and will continue to work with the clinical team to address health care and disease management related needs check blood sugar at prescribed times: [name]  check feet daily for  cuts, sores or redness enter blood sugar readings and medication or insulin into daily log take the blood sugar log to all doctor visits take the blood sugar meter to all doctor visits trim toenails straight across limit fast food meals to no more than 1 per week manage portion size prepare main meal at home 3 to 5 days each week read food labels for fat, fiber, carbohydrates and portion size set a realistic goal keep feet up while sitting wash and dry feet carefully every day wear comfortable, cotton socks Call your doctor for signs of skin infection/ non healing- redness, foul smelling drainage, warmth, fever, if overall you are not feeling well Try to eat 3 meals per day around the same time each day Interventions Today    Flowsheet Row Most Recent Value  Chronic Disease   Chronic disease during today's visit Other, Diabetes  [lymphedema, home health, leg wraps]  General Interventions   General Interventions Discussed/Reviewed General Interventions Reviewed, Doctor Visits  Doctor Visits Discussed/Reviewed Doctor Visits Reviewed, PCP  PCP/Specialist Visits Compliance with follow-up visit  [confirmed home health started 12/27/23 [facility] RN will return 01/03/24]  Education Interventions   Education Provided Provided Education  [lymphedema, activity]  Mental Health Interventions   Mental Health Discussed/Reviewed Mental Health Reviewed, Coping Strategies       Follow Up Plan: Telephone follow up appointment with care management team member scheduled for: /25 at 230 pm          SDOH assessments and interventions completed:  Yes     Care Coordination Interventions:  Yes, provided   Follow up plan: Follow up call scheduled for 01/11/24 1030    Encounter Outcome:  Patient Visit Completed   Shishir Krantz L. Noelle Penner, RN, BSN, CCM [place]  [facility], [facility] RN Care Manager Direct Dial: [phone]  Fax: [phone] Mailing Address: [address]   [place] [address] Website: [email or web address]

## 2023-12-28 NOTE — Patient Instructions (Signed)
Visit Information  Thank you for taking time to visit with me today. Please don't hesitate to contact me if I can be of assistance to you.   Following are the goals we discussed today:   Goals Addressed             This Visit's Progress    RNCM Care Management  (DIABETES) EXPECTED OUTCOME: MONITOR, SELF-MANAGE AND REDUCE SYMPTOMS OF DIABETES   On track    Current Barriers:  Knowledge Deficits related to Diabetes management Chronic Disease Management support and education needs related to Diabetes, diet Financial Constraints.  Patient reports CBG is monitored with Freestyle Libre  Patient reports she has initial consult for wound care on 04/07/23 due to "a puppy bit me on right leg",  pt is finished with antibiotic, update- pt states she decided not to go to wound center and states " the wound is almost completely healed" Update- pt states she is awaiting home health services for lymphedema lower extremity wrapping Patient reports sometimes she only eats one meal per day, depending on how she feels Lab Results  Component Value Date   HGBA1C 6.8 (H) 12/09/2023     Planned Interventions: Provided education to patient about basic DM disease process. The patient states she feels overall she is doing well with managing her DM. She did have a low early am. The patient states that it does not happen very often. The biggest concern she has right now is swelling in her feet and legs. The patient tried to call the office to get an earlier appointment but could not get one until the end of January. A message sent to staff to see if the patient could be seen earlier. ; Reviewed medications with patient and discussed importance of medication adherence. The patient is compliant with medications. The patient states she works with the pharm D and she has a follow up appointment next week;        Counseled on importance of regular laboratory monitoring as prescribed. Labs up to date.;        Advised  patient, providing education and rationale, to check cbg per CGM  and record. Has a freestyle libre. Does have trouble sometimes with it staying on her arm. Her daughter has been putting a clear bandage over it. This am her alarm went off and her blood sugar was 45 it came up to 77 and she has been having good numbers today. Review and education provided.        Review of patient status, including review of consultants reports, relevant laboratory and other test results, and medications completed;       Advised patient to discuss non healing wounds, signs of infection  with provider;      Reviewed signs/ symptoms of infection, reportable signs/ symptoms Reviewed carbohydrate modified diet Reviewed importance of eating 3 well balanced meals per day, preferably at the same time each day  Symptom Management: Take medications as prescribed   Attend all scheduled provider appointments Call pharmacy for medication refills 3-7 days in advance of running out of medications Attend church or other social activities Call provider office for new concerns or questions  Work with the social worker to address care coordination needs and will continue to work with the clinical team to address health care and disease management related needs check blood sugar at prescribed times: [name]  check feet daily for cuts, sores or redness enter blood sugar readings and medication or insulin into daily  log take the blood sugar log to all doctor visits take the blood sugar meter to all doctor visits trim toenails straight across limit fast food meals to no more than 1 per week manage portion size prepare main meal at home 3 to 5 days each week read food labels for fat, fiber, carbohydrates and portion size set a realistic goal keep feet up while sitting wash and dry feet carefully every day wear comfortable, cotton socks Call your doctor for signs of skin infection/ non healing- redness, foul smelling  drainage, warmth, fever, if overall you are not feeling well Try to eat 3 meals per day around the same time each day Interventions Today    Flowsheet Row Most Recent Value  Chronic Disease   Chronic disease during today's visit Other, Diabetes  [lymphedema, home health, leg wraps]  General Interventions   General Interventions Discussed/Reviewed General Interventions Reviewed, Doctor Visits  Doctor Visits Discussed/Reviewed Doctor Visits Reviewed, PCP  PCP/Specialist Visits Compliance with follow-up visit  [confirmed home health started 12/27/23 [facility] RN will return 01/03/24]  Education Interventions   Education Provided Provided Education  [lymphedema, activity]  Mental Health Interventions   Mental Health Discussed/Reviewed Mental Health Reviewed, Coping Strategies       Follow Up Plan: Telephone follow up appointment with care management team member scheduled for: /25 at 230 pm          Our next appointment is by telephone on 01/25/24 at 1130  Please call the care guide team at [phone] if you need to cancel or reschedule your appointment.   If you are experiencing a Mental Health or Behavioral Health Crisis or need someone to talk to, please call the Suicide and Crisis Lifeline: 988 call the [place] National Suicide Prevention Lifeline: 804-585-0224 or TTY: 918-351-4004 TTY 4701309321) to talk to a trained counselor call 1-800-273-TALK (toll free, 24 hour hotline) call the [facility]: 907-325-4199 call 911   The patient verbalized understanding of instructions, educational materials, and care plan provided today.    The patient has been provided with contact information for the care management team and has been advised to call with any health related questions or concerns.   Trashawn Oquendo L. Noelle Penner, RN, BSN, CCM [place]  [facility], [facility] RN Care Manager Direct Dial: [phone]  Fax: [phone] Mailing Address: [address]  [place] [address] Website: [email or web address]

## 2024-01-03 ENCOUNTER — Ambulatory Visit: Payer: No Typology Code available for payment source

## 2024-01-03 DIAGNOSIS — I2510 Atherosclerotic heart disease of native coronary artery without angina pectoris: Secondary | ICD-10-CM | POA: Diagnosis not present

## 2024-01-03 DIAGNOSIS — Z794 Long term (current) use of insulin: Secondary | ICD-10-CM

## 2024-01-03 DIAGNOSIS — J449 Chronic obstructive pulmonary disease, unspecified: Secondary | ICD-10-CM | POA: Diagnosis not present

## 2024-01-03 DIAGNOSIS — F17210 Nicotine dependence, cigarettes, uncomplicated: Secondary | ICD-10-CM | POA: Diagnosis not present

## 2024-01-03 DIAGNOSIS — E785 Hyperlipidemia, unspecified: Secondary | ICD-10-CM | POA: Diagnosis not present

## 2024-01-03 DIAGNOSIS — E1159 Type 2 diabetes mellitus with other circulatory complications: Secondary | ICD-10-CM

## 2024-01-03 DIAGNOSIS — N183 Chronic kidney disease, stage 3 unspecified: Secondary | ICD-10-CM

## 2024-01-03 DIAGNOSIS — Z6841 Body Mass Index (BMI) 40.0 and over, adult: Secondary | ICD-10-CM | POA: Diagnosis not present

## 2024-01-03 DIAGNOSIS — F32A Depression, unspecified: Secondary | ICD-10-CM | POA: Diagnosis not present

## 2024-01-03 DIAGNOSIS — I159 Secondary hypertension, unspecified: Secondary | ICD-10-CM

## 2024-01-03 DIAGNOSIS — E1142 Type 2 diabetes mellitus with diabetic polyneuropathy: Secondary | ICD-10-CM

## 2024-01-03 DIAGNOSIS — G4733 Obstructive sleep apnea (adult) (pediatric): Secondary | ICD-10-CM | POA: Diagnosis not present

## 2024-01-03 DIAGNOSIS — E1169 Type 2 diabetes mellitus with other specified complication: Secondary | ICD-10-CM | POA: Diagnosis not present

## 2024-01-03 DIAGNOSIS — Z9181 History of falling: Secondary | ICD-10-CM | POA: Diagnosis not present

## 2024-01-03 DIAGNOSIS — E1122 Type 2 diabetes mellitus with diabetic chronic kidney disease: Secondary | ICD-10-CM | POA: Diagnosis not present

## 2024-01-03 DIAGNOSIS — I890 Lymphedema, not elsewhere classified: Secondary | ICD-10-CM | POA: Diagnosis not present

## 2024-01-03 DIAGNOSIS — Z5982 Transportation insecurity: Secondary | ICD-10-CM | POA: Diagnosis not present

## 2024-01-03 DIAGNOSIS — Z7982 Long term (current) use of aspirin: Secondary | ICD-10-CM | POA: Diagnosis not present

## 2024-01-03 DIAGNOSIS — K219 Gastro-esophageal reflux disease without esophagitis: Secondary | ICD-10-CM | POA: Diagnosis not present

## 2024-01-10 ENCOUNTER — Ambulatory Visit: Payer: No Typology Code available for payment source

## 2024-01-10 VITALS — Ht 62.0 in | Wt 301.0 lb

## 2024-01-10 DIAGNOSIS — Z0000 Encounter for general adult medical examination without abnormal findings: Secondary | ICD-10-CM | POA: Diagnosis not present

## 2024-01-10 DIAGNOSIS — Z7982 Long term (current) use of aspirin: Secondary | ICD-10-CM | POA: Diagnosis not present

## 2024-01-10 DIAGNOSIS — I890 Lymphedema, not elsewhere classified: Secondary | ICD-10-CM | POA: Diagnosis not present

## 2024-01-10 DIAGNOSIS — E1169 Type 2 diabetes mellitus with other specified complication: Secondary | ICD-10-CM | POA: Diagnosis not present

## 2024-01-10 DIAGNOSIS — E785 Hyperlipidemia, unspecified: Secondary | ICD-10-CM | POA: Diagnosis not present

## 2024-01-10 DIAGNOSIS — G4733 Obstructive sleep apnea (adult) (pediatric): Secondary | ICD-10-CM | POA: Diagnosis not present

## 2024-01-10 DIAGNOSIS — F32A Depression, unspecified: Secondary | ICD-10-CM | POA: Diagnosis not present

## 2024-01-10 DIAGNOSIS — Z5982 Transportation insecurity: Secondary | ICD-10-CM | POA: Diagnosis not present

## 2024-01-10 DIAGNOSIS — Z6841 Body Mass Index (BMI) 40.0 and over, adult: Secondary | ICD-10-CM | POA: Diagnosis not present

## 2024-01-10 DIAGNOSIS — F17210 Nicotine dependence, cigarettes, uncomplicated: Secondary | ICD-10-CM | POA: Diagnosis not present

## 2024-01-10 DIAGNOSIS — Z9181 History of falling: Secondary | ICD-10-CM | POA: Diagnosis not present

## 2024-01-10 DIAGNOSIS — K219 Gastro-esophageal reflux disease without esophagitis: Secondary | ICD-10-CM | POA: Diagnosis not present

## 2024-01-10 DIAGNOSIS — I2510 Atherosclerotic heart disease of native coronary artery without angina pectoris: Secondary | ICD-10-CM | POA: Diagnosis not present

## 2024-01-10 DIAGNOSIS — Z794 Long term (current) use of insulin: Secondary | ICD-10-CM | POA: Diagnosis not present

## 2024-01-10 NOTE — Patient Instructions (Signed)
 Ms. [name] , Thank you for taking time to come for your Medicare Wellness Visit. I appreciate your ongoing commitment to your health goals. Please review the following plan we discussed and let me know if I can assist you in the future.   Referrals/Orders/Follow-Ups/Clinician Recommendations: Aim for 30 minutes of exercise or brisk walking, 6-8 glasses of water, and 5 servings of fruits and vegetables each day.  This is a list of the screening recommended for you and due dates:  Health Maintenance  Topic Date Due   Eye exam for diabetics  12/26/2019   COVID-19 Vaccine (1 - 2024-25 season) Never done   Flu Shot  02/06/2024*   Zoster (Shingles) Vaccine (1 of 2) 03/07/2024*   Screening for Lung Cancer  12/08/2024*   Pap with HPV screening  12/08/2024*   Pneumococcal Vaccination (2 of 2 - PPSV23 or PCV20) 12/08/2024*   Mammogram  12/08/2024*   Colon Cancer Screening  12/08/2024*   Yearly kidney health urinalysis for diabetes  03/20/2024   Complete foot exam   03/20/2024   Hemoglobin A1C  06/07/2024   Yearly kidney function blood test for diabetes  12/08/2024   Medicare Annual Wellness Visit  01/09/2025   Hepatitis C Screening  Completed   HIV Screening  Completed   HPV Vaccine  Aged Out   DTaP/Tdap/Td vaccine  Discontinued  *Topic was postponed. The date shown is not the original due date.    Advanced directives: (ACP Link)Information on Advanced Care Planning can be found at [facility] Advance Health Care Directives Advance Health Care Directives ([email or web address]   Next Medicare Annual Wellness Visit scheduled for next year: Yes

## 2024-01-10 NOTE — Progress Notes (Signed)
 Subjective:   [name] is a 63 y.o. who presents for a Medicare Wellness preventive visit.  Visit Complete: Virtual I connected with  [name] on 01/10/24 by a audio enabled telemedicine application and verified that I am speaking with the correct person using two identifiers.  Patient Location: Home  Provider Location: Home Office  I discussed the limitations of evaluation and management by telemedicine. The patient expressed understanding and agreed to proceed.  Vital Signs: Because this visit was a virtual/telehealth visit, some criteria may be missing or patient reported. Any vitals not documented were not able to be obtained and vitals that have been documented are patient reported.  VideoDeclined- This patient declined Librarian, academic. Therefore the visit was completed with audio only.  AWV Questionnaire: No: Patient Medicare AWV questionnaire was not completed prior to this visit.  Cardiac Risk Factors include: diabetes mellitus;dyslipidemia;hypertension;smoking/ tobacco exposure;sedentary lifestyle;obesity (BMI >30kg/m2)     Objective:    Today's Vitals   01/10/24 1354  Weight: (!) 301 lb (136.5 kg)  Height: 5\' 2"  (1.575 m)   Body mass index is 55.05 kg/m.     01/10/2024    1:59 PM 06/13/2023    6:41 AM 04/06/2023    3:23 PM 03/29/2023    1:01 PM 12/29/2022    8:25 AM 05/10/2022    8:42 AM 12/28/2021    8:38 AM  Advanced Directives  Does Patient Have a Medical Advance Directive? No No Yes Yes No No No  Type of Best boy of [place];Living will      Does patient want to make changes to medical advance directive?   No - Patient declined No - Patient declined     Copy of Healthcare Power of Attorney in Chart?   No - copy requested      Would patient like information on creating a medical advance directive? Yes (MAU/Ambulatory/Procedural Areas - Information given) No - Patient declined   No - Patient  declined  No - Patient declined    Current Medications (verified) Outpatient Encounter Medications as of 01/10/2024  Medication Sig   acetaminophen (TYLENOL) 325 MG tablet Take 975 mg by mouth every 8 (eight) hours as needed.   albuterol (VENTOLIN HFA) 108 (90 Base) MCG/ACT inhaler INHALE 2 PUFFS BY MOUTH EVERY 4 HOURS AS NEEDED FOR WHEEZING OR SHORTNESS OF BREATH (FOR RESCUE)   Alpha-Lipoic Acid 600 MG CAPS Take 1 capsule (600 mg total) by mouth daily. For diabetic neuropathy   aspirin EC 325 MG tablet Take 325 mg by mouth daily.   Blood Glucose Monitoring Suppl (ONETOUCH VERIO) w/Device KIT Use to test blood sugar twice daily as directed; DX: E11.69   Continuous Glucose Sensor (FREESTYLE LIBRE 3 PLUS SENSOR) MISC Change sensor every 15 days. DX: E11.65   furosemide (LASIX) 40 MG tablet TAKE 1 TABLET BY MOUTH ONCE DAILY AS NEEDED FOR EDEMA OR FLUID   gabapentin (NEURONTIN) 600 MG tablet TAKE 1 & 1/2 (ONE & ONE-HALF) TABLETS BY MOUTH THREE TIMES DAILY   glucose blood (ONETOUCH VERIO) test strip Use to test blood sugar twice daily as directed; DX: E11.69   Insulin Glargine (BASAGLAR KWIKPEN) 100 UNIT/ML Inject 45-50 Units into the skin at bedtime. May increase up to 50 units if instructed by your provider.   Insulin Pen Needle 29G X MISC Use to inject insulin daily as directed. DX:E11.65; please profile for future needs   methocarbamol (ROBAXIN) 500 MG tablet Take  1 tablet (500 mg total) by mouth every 6 (six) hours as needed for muscle spasms.   metoprolol tartrate (LOPRESSOR) 25 MG tablet Take 1 tablet (25 mg total) by mouth 2 (two) times daily.   nitroGLYCERIN (NITROSTAT) 0.4 MG SL tablet Place 1 tablet (0.4 mg total) under the tongue every 5 (five) minutes x 3 doses as needed for chest pain (if no relief after 3rd dose, proceed to ED or call 911).   omeprazole (PRILOSEC) 40 MG capsule Take 1 capsule (40 mg total) by mouth daily. For heartburn   OneTouch Delica Lancets 30G MISC Use to test  blood sugar twice daily as directed; DX: E11.69   oxybutynin (DITROPAN XL) 5 MG 24 hr tablet Take 1 tablet (5 mg total) by mouth at bedtime. For bladder   rosuvastatin (CRESTOR) 20 MG tablet Take 1 tablet (20 mg total) by mouth at bedtime.   Semaglutide 14 MG TABS Take 14 mg by mouth daily.   sertraline (ZOLOFT) 50 MG tablet Take 1 tablet (50 mg total) by mouth daily. Daily for depression/ anxiety   telmisartan (MICARDIS) 40 MG tablet Take 1 tablet (40 mg total) by mouth daily.   Vitamin D, Ergocalciferol, (DRISDOL) 1.25 MG (50000 UNIT) CAPS capsule TAKE 1 CAPSULE BY MOUTH ONCE A WEEK, THEN TAKE OTC VITAMIN D 800IU DAILY   No facility-administered encounter medications on file as of 01/10/2024.    Allergies (verified) Duloxetine, Farxiga [dapagliflozin], Morphine, and Trulicity [dulaglutide]   History: Past Medical History:  Diagnosis Date   Arthritis    CAD (coronary artery disease)    BMS to circumflex 2007 - Dr. Juanda Chance   CKD (chronic kidney disease) stage 3, GFR 30-59 ml/min (HCC)    COPD (chronic obstructive pulmonary disease) (HCC)    Depression    Dyspnea    Essential hypertension    Falls frequently    GERD (gastroesophageal reflux disease)    HA (headache)    Hyperlipidemia    Hyperlipidemia associated with type 2 diabetes mellitus (HCC) 02/03/2011   Left-sided face pain    Morbid obesity (HCC)    Noncompliance    OSA (obstructive sleep apnea)    Uses CPAP   Type 2 diabetes mellitus (HCC)    Urinary incontinence    Urinary incontinence    Past Surgical History:  Procedure Laterality Date   ABDOMINAL HERNIA REPAIR     ABDOMINAL HYSTERECTOMY     BACK SURGERY     BREAST SURGERY     biopsy per right breast; pt states has silver piece in for marking    CARPAL TUNNEL RELEASE Left 06/13/2023   Procedure: LEFT CARPAL TUNNEL RELEASE;  Surgeon: Oliver Barre, MD;  Location: [facility];  Service: Orthopedics;  Laterality: Left;   Cataract surgery Bilateral    CHOLECYSTECTOMY      COLONOSCOPY N/A 04/10/2013   Procedure: COLONOSCOPY;  Surgeon: Malissa Hippo, MD;  Location: [facility];  Service: Endoscopy;  Laterality: N/A;   CORONARY ANGIOPLASTY WITH STENT PLACEMENT  2012   HERNIA REPAIR     LEFT HEART CATH AND CORONARY ANGIOGRAPHY N/A 06/19/2020   Procedure: LEFT HEART CATH AND CORONARY ANGIOGRAPHY;  Surgeon: Lyn Records, MD;  Location: [facility];  Service: Cardiovascular;  Laterality: N/A;   LUMBAR LAMINECTOMY/DECOMPRESSION MICRODISCECTOMY N/A 04/25/2015   Procedure: CENTRAL LUMBAR /DECOMPRESSION L4-L5;  Surgeon: Ranee Gosselin, MD;  Location: [facility];  Service: Orthopedics;  Laterality: N/A;   TUBAL LIGATION     Family History  Problem Relation Age of Onset   Breast cancer Mother    Cancer Father    Coronary artery disease Father    Cancer - Colon Father    Diabetes Father    High Cholesterol Father    Arthritis Sister    Asthma Sister    High Cholesterol Daughter    Thyroid disease Daughter    Hiatal hernia Son    Congenital heart disease Son    Social History   Socioeconomic History   Marital status: Married    Spouse name: [name]   Number of children: 5   Years of education: 10 th   Highest education level: 10th grade  Occupational History   Occupation: disabled     Comment: disabled  Tobacco Use   Smoking status: Every Day    Current packs/day: 1.00    Average packs/day: 1 pack/day for 51.7 years (51.7 ttl pk-yrs)    Types: Cigarettes    Start date: 05/14/1972    Passive exposure: Current   Smokeless tobacco: Never   Tobacco comments:    Smoking Cessation Classes, Services, Agencies & Resources Offered.  Vaping Use   Vaping status: Never Used  Substance and Sexual Activity   Alcohol use: No    Alcohol/week: 0.0 standard drinks of alcohol   Drug use: No   Sexual activity: Not Currently    Partners: Male    Birth control/protection: Surgical  Other Topics Concern   Not on file  Social History Narrative   Patient  lives at home with her husband. Patient is disabled.   Children live nearby   Patient has 10 th grade education.   Right handed.   Caffeine- one  cup of coffee and  One soda Dr.Pepper/ tea. daily   Social Drivers of Health   Financial Resource Strain: Medium Risk (01/10/2024)   Overall Financial Resource Strain (CARDIA)    Difficulty of Paying Living Expenses: Somewhat hard  Food Insecurity: No Food Insecurity (01/10/2024)   Hunger Vital Sign    Worried About Running Out of Food in the Last Year: Never true    Ran Out of Food in the Last Year: Never true  Transportation Needs: No Transportation Needs (01/10/2024)   PRAPARE - Administrator, Civil Service (Medical): No    Lack of Transportation (Non-Medical): No  Physical Activity: Inactive (01/10/2024)   Exercise Vital Sign    Days of Exercise per Week: 0 days    Minutes of Exercise per Session: 0 min  Stress: No Stress Concern Present (01/10/2024)   [employer] of Occupational Health - Occupational Stress Questionnaire    Feeling of Stress : Not at all  Social Connections: Moderately Integrated (01/10/2024)   Social Connection and Isolation Panel [NHANES]    Frequency of Communication with Friends and Family: More than three times a week    Frequency of Social Gatherings with Friends and Family: Three times a week    Attends Religious Services: More than 4 times per year    Active Member of Clubs or Organizations: No    Attends Banker Meetings: Never    Marital Status: Married    Tobacco Counseling Ready to quit: Not Answered Counseling given: Not Answered Tobacco comments: Smoking Cessation Classes, Services, Engineering geologist.    Clinical Intake:  Pre-visit preparation completed: Yes  Pain : No/denies pain     Diabetes: Yes CBG done?: No Did pt. bring in CBG monitor from home?: No  How often  do you need to have someone help you when you read instructions, pamphlets, or other  written materials from your doctor or pharmacy?: 1 - Never  Interpreter Needed?: No  Information entered by :: Kandis Fantasia LPN   Activities of Daily Living     01/10/2024    1:56 PM 06/09/2023   11:56 AM  In your present state of health, do you have any difficulty performing the following activities:  Hearing? 0   Vision? 0   Difficulty concentrating or making decisions? 0   Walking or climbing stairs? 1   Dressing or bathing? 0   Doing errands, shopping? 1 0  Preparing Food and eating ? N   Using the Toilet? N   In the past six months, have you accidently leaked urine? N   Do you have problems with loss of bowel control? N   Managing your Medications? N   Managing your Finances? N   Housekeeping or managing your Housekeeping? Y     Patient Care Team: [name], DO as PCP - General (Family Medicine) Jonelle Sidle, MD as PCP - Cardiology (Cardiology) Levert Feinstein, MD as Consulting Physician (Neurology) Ranee Gosselin, MD as Consulting Physician (Orthopedic Surgery) Jonelle Sidle, MD as Consulting Physician (Cardiology) Danella Maiers, [facility] (Pharmacist) Marlowe Sax, RN as Case Manager (General Practice)  Indicate any recent Medical Services you may have received from other than [facility] providers in the past year (date may be approximate).     Assessment:   This is a routine wellness examination for Sundy.  Hearing/Vision screen Hearing Screening - Comments:: Denies hearing difficulties   Vision Screening - Comments:: Wears rx glasses - up to date with routine eye exams with [facility] [place]     Goals Addressed   None    Depression Screen     01/10/2024    2:00 PM 12/09/2023    2:03 PM 11/07/2023    2:25 PM 05/24/2023    1:17 PM 04/06/2023    3:19 PM 03/29/2023   12:56 PM 03/21/2023   12:11 PM  PHQ 2/9 Scores  PHQ - 2 Score 4  4 6  0 0 4  PHQ- 9 Score 9  9 21  0  14  Exception Documentation  Patient refusal         Fall Risk     01/10/2024     1:56 PM 12/09/2023    1:58 PM 11/07/2023    2:27 PM 05/24/2023    1:17 PM 04/06/2023    3:12 PM  Fall Risk   Falls in the past year? 0 0 0 0 0  Number falls in past yr: 0 0 0 0 0  Injury with Fall? 0 0 0 0 0  Risk for fall due to : No Fall Risks No Fall Risks No Fall Risks No Fall Risks No Fall Risks  Follow up Falls prevention discussed;Education provided;Falls evaluation completed Falls evaluation completed Education provided Education provided Falls evaluation completed;Education provided;Falls prevention discussed    MEDICARE RISK AT HOME:  Medicare Risk at Home Any stairs in or around the home?: No If so, are there any without handrails?: No Home free of loose throw rugs in walkways, pet beds, electrical cords, etc?: Yes Adequate lighting in your home to reduce risk of falls?: Yes Life alert?: No Use of a cane, walker or w/c?: No Grab bars in the bathroom?: Yes Shower chair or bench in shower?: No Elevated toilet seat or a handicapped toilet?: Yes  TIMED UP AND GO:  Was the test performed?  No  Cognitive Function: 6CIT completed    11/27/2018    1:21 PM 11/23/2017   10:10 AM  MMSE - Mini Mental State Exam  Orientation to time 5 5  Orientation to Place 5 5  Registration 3 3  Attention/ Calculation 5 5  Recall 3 2  Language- name 2 objects 2 2  Language- repeat 1 1  Language- follow 3 step command 3 3  Language- read & follow direction 1 1  Write a sentence 1 1  Copy design 1 1  Total score 30 29        01/10/2024    1:59 PM 12/29/2022    8:26 AM 12/28/2021    8:41 AM 12/25/2020    8:35 AM 12/21/2019    9:00 AM  6CIT Screen  What Year? 0 points 0 points 0 points 0 points 0 points  What month? 0 points 0 points 0 points 0 points 0 points  What time? 0 points 0 points 0 points 0 points 0 points  Count back from 20 0 points 0 points 0 points 0 points 0 points  Months in reverse 0 points 0 points 4 points 4 points 0 points  Repeat phrase 0 points 0 points 2 points 2  points 0 points  Total Score 0 points 0 points 6 points 6 points 0 points    Immunizations Immunization History  Administered Date(s) Administered   Influenza,inj,Quad PF,6+ Mos 08/10/2013, 08/28/2014, 09/25/2015, 11/23/2017, 11/27/2018   Pneumococcal Conjugate-13 11/27/2018   Td (Adult),5 Lf Tetanus Toxid, Preservative Free 12/01/1998    Screening Tests Health Maintenance  Topic Date Due   OPHTHALMOLOGY EXAM  12/26/2019   COVID-19 Vaccine (1 - 2024-25 season) Never done   INFLUENZA VACCINE  02/06/2024 (Originally 06/09/2023)   Zoster Vaccines- Shingrix (1 of 2) 03/07/2024 (Originally 05/10/2009)   Lung Cancer Screening  12/08/2024 (Originally 05/10/2009)   Cervical Cancer Screening (HPV/Pap Cotest)  12/08/2024 (Originally 05/10/1989)   Pneumococcal Vaccine 50-50 Years old (2 of 2 - PPSV23 or PCV20) 12/08/2024 (Originally 01/22/2019)   MAMMOGRAM  12/08/2024 (Originally 02/02/2023)   Colonoscopy  12/08/2024 (Originally 04/10/2018)   Diabetic kidney evaluation - Urine ACR  03/20/2024   FOOT EXAM  03/20/2024   HEMOGLOBIN A1C  06/07/2024   Diabetic kidney evaluation - eGFR measurement  12/08/2024   Medicare Annual Wellness (AWV)  01/09/2025   Hepatitis C Screening  Completed   HIV Screening  Completed   HPV VACCINES  Aged Out   DTaP/Tdap/Td  Discontinued    Health Maintenance  Health Maintenance Due  Topic Date Due   OPHTHALMOLOGY EXAM  12/26/2019   COVID-19 Vaccine (1 - 2024-25 season) Never done   Health Maintenance Items Addressed: Patient declines preventive screenings and vaccines at this time   Additional Screening:  Vision Screening: Recommended annual ophthalmology exams for early detection of glaucoma and other disorders of the eye.  Dental Screening: Recommended annual dental exams for proper oral hygiene  Community Resource Referral / Chronic Care Management: CRR required this visit?  No   CCM required this visit?  No     Plan:     I have personally reviewed  and noted the following in the patient's chart:   Medical and social history Use of alcohol, tobacco or illicit drugs  Current medications and supplements including opioid prescriptions. Patient is not currently taking opioid prescriptions. Functional ability and status Nutritional status Physical activity Advanced directives List of other  physicians Hospitalizations, surgeries, and ER visits in previous 12 months Vitals Screenings to include cognitive, depression, and falls Referrals and appointments  In addition, I have reviewed and discussed with patient certain preventive protocols, quality metrics, and best practice recommendations. A written personalized care plan for preventive services as well as general preventive health recommendations were provided to patient.     Kandis Fantasia [place], [place]   4/0/9811   After Visit Summary: (Mail) Due to this being a telephonic visit, the after visit summary with patients personalized plan was offered to patient via mail   Notes: Nothing significant to report at this time.

## 2024-01-11 ENCOUNTER — Ambulatory Visit: Payer: Self-pay | Admitting: *Deleted

## 2024-01-11 NOTE — Patient Outreach (Signed)
 Care Coordination   Follow Up Visit Note   01/24/2024 Name: [name] MRN: [id number] DOB: [birth year]  [name] is a 63 y.o. year old female who sees [name], DO for primary care. I spoke with  [name] by phone today.  What matters to the patients health and wellness today?  Leg wrapping  Still swollen legs stay cold all the time Uses special shoes ordered by son online Extra padding bottom of left feet had a blister with skin pilled back  by home health nurse Home health nurse recommended an Ultrasound  More left foot more spasms that go up into her hip with no mobility/sitting She is presently not doing PT exercises like lifting her legs while seated Does walk through home Encouraged to wiggle toes, flexing foot pain level 7  Inquired about the new $21 her pharmacy requested to replace her Freestyle kit  She can not afford an extra $21 as they have financial strain related to their [employer] bill >$600/month They have checked with [employer] related to the bill, paid on it but has not found a resolution   With a check on her [employer] choice give back [employer] PPO plan she had not met her beginning of the year deductible that may allow her standard co pays. She voiced understanding to this, the 2025 changes related to medicare gap services. She will continue to use her finger stick capillary blood glucose (cbg) machine until her deductible is met  She will check her [employer] statements or local pharmacy to see how much more she will need to fulfill her annual beginning of the year deductible    Goals Addressed             This Visit's Progress    RNCM Care Management  (DIABETES) EXPECTED OUTCOME: MONITOR, SELF-MANAGE AND REDUCE SYMPTOMS OF DIABETES       Current Barriers:  Knowledge Deficits related to Diabetes, Lymphedema management Chronic Disease Management support and education needs related to Diabetes, diet Financial Constraints.  Patient reports CBG is  monitored with Freestyle Libre but now can not afford extra $21 to get supplies from pharmacy until she has met her annual deductible Patient reports she has initial consult for wound care on 04/07/23 due to "a puppy bit me on right leg",  pt is finished with antibiotic, update- pt states she decided not to go to wound center and states " the wound is almost completely healed" Update- home health services for lymphedema lower extremity wrapping is continuing, Patient reports sometimes she only eats one meal per day, depending on how she feels Lab Results  Component Value Date   HGBA1C 6.8 (H) 12/09/2023     Planned Interventions: Provided education to patient about basic DM disease process. The patient states she feels overall she is doing well with managing her DM.  Reviewed medications with patient and discussed importance of medication adherence. The patient is compliant with medications. The patient states she works with the pharm D and she has a follow up appointment next week;        Counseled on importance of regular laboratory monitoring as prescribed. Labs up to date.;        Advised patient, providing education and rationale, to check cbg per CGM  and record. Has a freestyle libre. Does have trouble sometimes with it staying on her arm. Her daughter has been putting a clear bandage over it..        Review  of patient status, including review of consultants reports, relevant laboratory and other test results, and medications completed;       Advised patient to discuss non healing wounds, signs of infection  with provider;      Reviewed signs/ symptoms of infection, reportable signs/ symptoms Reviewed carbohydrate modified diet Reviewed importance of eating 3 well balanced meals per day, preferably at the same time each day  Symptom Management: Take medications as prescribed   Attend all scheduled provider appointments Call pharmacy for medication refills 3-7 days in advance of running  out of medications Attend church or other social activities Call provider office for new concerns or questions  Work with the social worker to address care coordination needs and will continue to work with the clinical team to address health care and disease management related needs check blood sugar at prescribed times: [name]  check feet daily for cuts, sores or redness enter blood sugar readings and medication or insulin into daily log take the blood sugar log to all doctor visits take the blood sugar meter to all doctor visits trim toenails straight across limit fast food meals to no more than 1 per week manage portion size prepare main meal at home 3 to 5 days each week read food labels for fat, fiber, carbohydrates and portion size set a realistic goal keep feet up while sitting wash and dry feet carefully every day wear comfortable, cotton socks Call your doctor for signs of skin infection/ non healing- redness, foul smelling drainage, warmth, fever, if overall you are not feeling well Try to eat 3 meals per day around the same time each day Interventions Today    Flowsheet Row Most Recent Value  Chronic Disease   Chronic disease during today's visit Diabetes, Other  [home health, mobility, medicines, glucometer vs Freestyle (cost) annual year deductible]  General Interventions   General Interventions Discussed/Reviewed General Interventions Reviewed, Doctor Visits, [facility], Horticulturist, commercial (DME)  Doctor Visits Discussed/Reviewed Doctor Visits Reviewed, PCP  Horticulturist, commercial (DME) Glucomoter  PCP/Specialist Visits Compliance with follow-up visit  Education Interventions   Education Provided Provided Education  [employer] deductible, [employer] energy savings plan for electricity]  Provided Verbal Education On Development worker, community, [employer]  Mental Health Interventions   Mental Health Discussed/Reviewed Mental Health Reviewed,  Coping Strategies       Follow Up Plan: Telephone follow up appointment with care management team member scheduled for:        COMPLETED: RNCM: Care Management (HYPERTENSION) EXPECTED OUTCOME: SELF-MANAGE AND REDUCE SYMPTOMS OF HYPERTENSION       Current Barriers:  Knowledge Deficits related to Hypertension management Care Coordination needs related to pharmacy/ social work needs in a patient with Hypertension Chronic Disease Management support 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  No Advanced Directives in place- pt declines Patient reports she lives with spouse, has cane and walker for ambulation, spouse assists with cooking due to pt cannot stand up for long periods, pt has had no falls, spouse provides transportation Patient reports she is on payment plan for power bill and has difficulty affording food, receives 23$/ month food stamps Patient has blood pressure cuff but does not monitor blood pressure Patient reports she has all medications including albuterol and using as prescribed, pharmacist is working with pt BP Readings from Last 3 Encounters:  06/13/23 (!) 97/57  06/09/23 (!) 115/50  05/24/23 (!) 106/56     Planned Interventions: Evaluation of current treatment plan related  to hypertension self management and patient's adherence to plan as established by provider. Denies any fluctuations with her blood pressures. She is having fluid retention. Education provided on weighing self to see if she is having water shifts. Wants to get in to see the provider to see if she can get some help for the pain and discomfort she is experiencing in her legs due to the swelling.    Reviewed medications with patient and discussed importance of compliance. Working with the pharm D currently, appointment next week;  Counseled on the importance of exercise goals with target of 150 minutes per week Advised patient, providing education and rationale, to monitor blood  pressure daily and record, calling PCP for findings outside established parameters;  Reviewed low sodium diet  Symptom Management: Take medications as prescribed   Attend all scheduled provider appointments Call pharmacy for medication refills 3-7 days in advance of running out of medications Attend church or other social activities Call provider office for new concerns or questions  Work with the social worker to address care coordination needs and will continue to work with the clinical team to address health care and disease management related needs check blood pressure 3 times per week choose a place to take my blood pressure (home, clinic or office, retail store) write blood pressure results in a log or diary learn about high blood pressure keep a blood pressure log keep all doctor appointments take medications for blood pressure exactly as prescribed begin an exercise program report new symptoms to your doctor eat more whole grains, fruits and vegetables, lean meats and healthy fats Follow low sodium diet- read food labels for sodium content Limit/ avoid fast food  Follow Up Plan: Telephone follow up appointment with care management team member scheduled for:   12-08-2023 at 230 pm          SDOH assessments and interventions completed:  No     Care Coordination Interventions:  Yes, provided   Follow up plan: Follow up call scheduled for 01/24/24    Encounter Outcome:  Patient Visit Completed   Cala Bradford L. Noelle Penner, RN, BSN, CCM [place]  [facility], [facility] RN Care Manager Direct Dial: [phone]  Fax: [phone] Mailing Address: [address]  [place] [address] Website: [email or web address]

## 2024-01-17 ENCOUNTER — Telehealth: Payer: Self-pay

## 2024-01-17 DIAGNOSIS — I2510 Atherosclerotic heart disease of native coronary artery without angina pectoris: Secondary | ICD-10-CM | POA: Diagnosis not present

## 2024-01-17 DIAGNOSIS — F32A Depression, unspecified: Secondary | ICD-10-CM | POA: Diagnosis not present

## 2024-01-17 DIAGNOSIS — Z9181 History of falling: Secondary | ICD-10-CM | POA: Diagnosis not present

## 2024-01-17 DIAGNOSIS — F17210 Nicotine dependence, cigarettes, uncomplicated: Secondary | ICD-10-CM | POA: Diagnosis not present

## 2024-01-17 DIAGNOSIS — G4733 Obstructive sleep apnea (adult) (pediatric): Secondary | ICD-10-CM | POA: Diagnosis not present

## 2024-01-17 DIAGNOSIS — I890 Lymphedema, not elsewhere classified: Secondary | ICD-10-CM | POA: Diagnosis not present

## 2024-01-17 DIAGNOSIS — E785 Hyperlipidemia, unspecified: Secondary | ICD-10-CM | POA: Diagnosis not present

## 2024-01-17 DIAGNOSIS — Z7982 Long term (current) use of aspirin: Secondary | ICD-10-CM | POA: Diagnosis not present

## 2024-01-17 DIAGNOSIS — E1169 Type 2 diabetes mellitus with other specified complication: Secondary | ICD-10-CM | POA: Diagnosis not present

## 2024-01-17 DIAGNOSIS — Z6841 Body Mass Index (BMI) 40.0 and over, adult: Secondary | ICD-10-CM | POA: Diagnosis not present

## 2024-01-17 DIAGNOSIS — Z5982 Transportation insecurity: Secondary | ICD-10-CM | POA: Diagnosis not present

## 2024-01-17 DIAGNOSIS — K219 Gastro-esophageal reflux disease without esophagitis: Secondary | ICD-10-CM | POA: Diagnosis not present

## 2024-01-17 DIAGNOSIS — Z794 Long term (current) use of insulin: Secondary | ICD-10-CM | POA: Diagnosis not present

## 2024-01-17 NOTE — Telephone Encounter (Signed)
 Called to inform patient that Rybelsus was in and would be up front ready for pick up. Patient verbalized understanding. [facility] 01/17/24

## 2024-01-18 ENCOUNTER — Other Ambulatory Visit: Payer: Self-pay | Admitting: Family Medicine

## 2024-01-18 DIAGNOSIS — E1142 Type 2 diabetes mellitus with diabetic polyneuropathy: Secondary | ICD-10-CM

## 2024-01-24 ENCOUNTER — Ambulatory Visit: Payer: Self-pay | Admitting: *Deleted

## 2024-01-24 DIAGNOSIS — Z794 Long term (current) use of insulin: Secondary | ICD-10-CM | POA: Diagnosis not present

## 2024-01-24 DIAGNOSIS — G4733 Obstructive sleep apnea (adult) (pediatric): Secondary | ICD-10-CM | POA: Diagnosis not present

## 2024-01-24 DIAGNOSIS — F17210 Nicotine dependence, cigarettes, uncomplicated: Secondary | ICD-10-CM | POA: Diagnosis not present

## 2024-01-24 DIAGNOSIS — F32A Depression, unspecified: Secondary | ICD-10-CM | POA: Diagnosis not present

## 2024-01-24 DIAGNOSIS — Z6841 Body Mass Index (BMI) 40.0 and over, adult: Secondary | ICD-10-CM | POA: Diagnosis not present

## 2024-01-24 DIAGNOSIS — K219 Gastro-esophageal reflux disease without esophagitis: Secondary | ICD-10-CM | POA: Diagnosis not present

## 2024-01-24 DIAGNOSIS — E785 Hyperlipidemia, unspecified: Secondary | ICD-10-CM | POA: Diagnosis not present

## 2024-01-24 DIAGNOSIS — I890 Lymphedema, not elsewhere classified: Secondary | ICD-10-CM | POA: Diagnosis not present

## 2024-01-24 DIAGNOSIS — Z9181 History of falling: Secondary | ICD-10-CM | POA: Diagnosis not present

## 2024-01-24 DIAGNOSIS — Z5982 Transportation insecurity: Secondary | ICD-10-CM | POA: Diagnosis not present

## 2024-01-24 DIAGNOSIS — Z7982 Long term (current) use of aspirin: Secondary | ICD-10-CM | POA: Diagnosis not present

## 2024-01-24 DIAGNOSIS — I2510 Atherosclerotic heart disease of native coronary artery without angina pectoris: Secondary | ICD-10-CM | POA: Diagnosis not present

## 2024-01-24 DIAGNOSIS — E1169 Type 2 diabetes mellitus with other specified complication: Secondary | ICD-10-CM | POA: Diagnosis not present

## 2024-01-24 NOTE — Patient Outreach (Addendum)
 Care Coordination   Follow Up Visit Note   01/24/2024 Name: [name] MRN: [id number] DOB: [birth year]  [name] is a 63 y.o. year old female who sees Stacey Ip, DO for primary care. I spoke with  [name] by phone today.  What matters to the patients health and wellness today?  Leg wrapping today by home health nurse-painful States looking better Voiced understanding of warm transfer to new RN CM  [name] obtained 01/17/24   Goals Addressed             This Visit's Progress    RNCM Care Management  (DIABETES) EXPECTED OUTCOME: MONITOR, SELF-MANAGE AND REDUCE SYMPTOMS OF DIABETES   On track    Current Barriers:  Knowledge Deficits related to Diabetes, Lymphedema management Chronic Disease Management support and education needs related to Diabetes, diet Financial Constraints.  Patient reports CBG is monitored with Freestyle Libre  Patient reports she has initial consult for wound care on 04/07/23 due to "a puppy bit me on right leg",  pt is finished with antibiotic, update- pt states she decided not to go to wound center and states " the wound is almost completely healed" Update- home health services for lymphedema lower extremity wrapping is continuing, Nurse visiting at time of call on 01/24/24 Patient reports sometimes she only eats one meal per day, depending on how she feels Lab Results  Component Value Date   HGBA1C 6.8 (H) 12/09/2023     Planned Interventions: Provided education to patient about basic DM disease process. The patient states she feels overall she is doing well with managing her DM.  Reviewed medications with patient and discussed importance of medication adherence. The patient is compliant with medications. The patient states she works with the pharm D and she has a follow up appointment next week;  Recenlty received her [name] 01/17/24      Counseled on importance of regular laboratory monitoring as prescribed. Labs up to date.;         Advised patient, providing education and rationale, to check cbg per CGM  and record. Has a freestyle libre. Does have trouble sometimes with it staying on her arm. Her daughter has been putting a clear bandage over it..        Review of patient status, including review of consultants reports, relevant laboratory and other test results, and medications completed;       Advised patient to discuss non healing wounds, signs of infection  with provider;      Reviewed signs/ symptoms of infection, reportable signs/ symptoms Reviewed carbohydrate modified diet Reviewed importance of eating 3 well balanced meals per day, preferably at the same time each day  Symptom Management: Take medications as prescribed   Attend all scheduled provider appointments Call pharmacy for medication refills 3-7 days in advance of running out of medications Attend church or other social activities Call provider office for new concerns or questions  Work with the social worker to address care coordination needs and will continue to work with the clinical team to address health care and disease management related needs check blood sugar at prescribed times: [name]  check feet daily for cuts, sores or redness enter blood sugar readings and medication or insulin into daily log take the blood sugar log to all doctor visits take the blood sugar meter to all doctor visits trim toenails straight across limit fast food meals to no more than 1 per week manage portion size prepare main  meal at home 3 to 5 days each week read food labels for fat, fiber, carbohydrates and portion size set a realistic goal keep feet up while sitting wash and dry feet carefully every day wear comfortable, cotton socks Call your doctor for signs of skin infection/ non healing- redness, foul smelling drainage, warmth, fever, if overall you are not feeling well Try to eat 3 meals per day around the same time each day Interventions Today     Flowsheet Row Most Recent Value  Chronic Disease   Chronic disease during today's visit Other  [wrapping of legs by home health nurse today]  General Interventions   General Interventions Discussed/Reviewed General Interventions Reviewed  Exercise Interventions   Exercise Discussed/Reviewed Exercise Reviewed, Physical Activity  Physical Activity Discussed/Reviewed Physical Activity Reviewed, Home Exercise Program (HEP)  Education Interventions   Education Provided Provided Education  [warm transfer]  Provided Verbal Education On Community Resources  Mental Health Interventions   Mental Health Discussed/Reviewed Mental Health Reviewed, Coping Strategies       Follow Up Plan: Telephone follow up appointment with care management team member scheduled for:           SDOH assessments and interventions completed:  No     Care Coordination Interventions:  Yes, provided   Follow up plan: No further intervention required. Warm transfer to Danise Edge 02/24/24 2 pm    Encounter Outcome:  Patient Visit Completed    Layken Beg L. Noelle Penner, RN, BSN, CCM [place]  [facility], [facility] RN Care Manager Direct Dial: [phone]  Fax: [phone]

## 2024-01-24 NOTE — Patient Instructions (Signed)
 Visit Information  Thank you for taking time to visit with me today. Please don't hesitate to contact me if I can be of assistance to you.   Following are the goals we discussed today:   Goals Addressed             This Visit's Progress    RNCM Care Management  (DIABETES) EXPECTED OUTCOME: MONITOR, SELF-MANAGE AND REDUCE SYMPTOMS OF DIABETES   On track    Current Barriers:  Knowledge Deficits related to Diabetes, Lymphedema management Chronic Disease Management support and education needs related to Diabetes, diet Financial Constraints.  Patient reports CBG is monitored with Freestyle Libre  Patient reports she has initial consult for wound care on 04/07/23 due to "a puppy bit me on right leg",  pt is finished with antibiotic, update- pt states she decided not to go to wound center and states " the wound is almost completely healed" Update- home health services for lymphedema lower extremity wrapping is continuing, Nurse visiting at time of call on 01/24/24 Patient reports sometimes she only eats one meal per day, depending on how she feels Lab Results  Component Value Date   HGBA1C 6.8 (H) 12/09/2023     Planned Interventions: Provided education to patient about basic DM disease process. The patient states she feels overall she is doing well with managing her DM.  Reviewed medications with patient and discussed importance of medication adherence. The patient is compliant with medications. The patient states she works with the pharm D and she has a follow up appointment next week;  Recenlty received her Rybelsus 01/17/24      Counseled on importance of regular laboratory monitoring as prescribed. Labs up to date.;        Advised patient, providing education and rationale, to check cbg per CGM  and record. Has a freestyle libre. Does have trouble sometimes with it staying on her arm. Her daughter has been putting a clear bandage over it..        Review of patient status, including review  of consultants reports, relevant laboratory and other test results, and medications completed;       Advised patient to discuss non healing wounds, signs of infection  with provider;      Reviewed signs/ symptoms of infection, reportable signs/ symptoms Reviewed carbohydrate modified diet Reviewed importance of eating 3 well balanced meals per day, preferably at the same time each day  Symptom Management: Take medications as prescribed   Attend all scheduled provider appointments Call pharmacy for medication refills 3-7 days in advance of running out of medications Attend church or other social activities Call provider office for new concerns or questions  Work with the social worker to address care coordination needs and will continue to work with the clinical team to address health care and disease management related needs check blood sugar at prescribed times: [name]  check feet daily for cuts, sores or redness enter blood sugar readings and medication or insulin into daily log take the blood sugar log to all doctor visits take the blood sugar meter to all doctor visits trim toenails straight across limit fast food meals to no more than 1 per week manage portion size prepare main meal at home 3 to 5 days each week read food labels for fat, fiber, carbohydrates and portion size set a realistic goal keep feet up while sitting wash and dry feet carefully every day wear comfortable, cotton socks Call your doctor for signs of skin infection/  non healing- redness, foul smelling drainage, warmth, fever, if overall you are not feeling well Try to eat 3 meals per day around the same time each day Interventions Today    Flowsheet Row Most Recent Value  Chronic Disease   Chronic disease during today's visit Other  [wrapping of legs by home health nurse today]  General Interventions   General Interventions Discussed/Reviewed General Interventions Reviewed  Exercise Interventions    Exercise Discussed/Reviewed Exercise Reviewed, Physical Activity  Physical Activity Discussed/Reviewed Physical Activity Reviewed, Home Exercise Program (HEP)  Education Interventions   Education Provided Provided Education  [warm transfer]  Provided Verbal Education On Community Resources  Mental Health Interventions   Mental Health Discussed/Reviewed Mental Health Reviewed, Coping Strategies       Follow Up Plan: Telephone follow up appointment with care management team member scheduled for:           Our next appointment is by telephone on 02/24/24 at 2 pm by Danise Edge  Please call the care guide team at [phone] if you need to cancel or reschedule your appointment.   If you are experiencing a Mental Health or Behavioral Health Crisis or need someone to talk to, please call the Suicide and Crisis Lifeline: 988 call the [place] National Suicide Prevention Lifeline: 949 273 5921 or TTY: 865-759-0591 TTY (972) 157-6783) to talk to a trained counselor call 1-800-273-TALK (toll free, 24 hour hotline) call the [facility]: (216)558-0841 call 911   No preference for copy of avs  The patient has been provided with contact information for the care management team and has been advised to call with any health related questions or concerns.   Ilamae Geng L. Noelle Penner, RN, BSN, CCM [place]  [facility], [facility] RN Care Manager Direct Dial: [phone]  Fax: [phone]

## 2024-01-24 NOTE — Patient Instructions (Addendum)
 Visit Information  Thank you for taking time to visit with me today. Please don't hesitate to contact me if I can be of assistance to you.   Following are the goals we discussed today:   Goals Addressed             This Visit's Progress    RNCM Care Management  (DIABETES) EXPECTED OUTCOME: MONITOR, SELF-MANAGE AND REDUCE SYMPTOMS OF DIABETES       Current Barriers:  Knowledge Deficits related to Diabetes, Lymphedema management Chronic Disease Management support and education needs related to Diabetes, diet Financial Constraints.  Patient reports CBG is monitored with Freestyle Libre but now can not afford extra $21 to get supplies from pharmacy until she has met her annual deductible Patient reports she has initial consult for wound care on 04/07/23 due to "a puppy bit me on right leg",  pt is finished with antibiotic, update- pt states she decided not to go to wound center and states " the wound is almost completely healed" Update- home health services for lymphedema lower extremity wrapping is continuing, Patient reports sometimes she only eats one meal per day, depending on how she feels Lab Results  Component Value Date   HGBA1C 6.8 (H) 12/09/2023     Planned Interventions: Provided education to patient about basic DM disease process. The patient states she feels overall she is doing well with managing her DM.  Reviewed medications with patient and discussed importance of medication adherence. The patient is compliant with medications. The patient states she works with the pharm D and she has a follow up appointment next week;        Counseled on importance of regular laboratory monitoring as prescribed. Labs up to date.;        Advised patient, providing education and rationale, to check cbg per CGM  and record. Has a freestyle libre. Does have trouble sometimes with it staying on her arm. Her daughter has been putting a clear bandage over it..        Review of patient status,  including review of consultants reports, relevant laboratory and other test results, and medications completed;       Advised patient to discuss non healing wounds, signs of infection  with provider;      Reviewed signs/ symptoms of infection, reportable signs/ symptoms Reviewed carbohydrate modified diet Reviewed importance of eating 3 well balanced meals per day, preferably at the same time each day  Symptom Management: Take medications as prescribed   Attend all scheduled provider appointments Call pharmacy for medication refills 3-7 days in advance of running out of medications Attend church or other social activities Call provider office for new concerns or questions  Work with the social worker to address care coordination needs and will continue to work with the clinical team to address health care and disease management related needs check blood sugar at prescribed times: [name]  check feet daily for cuts, sores or redness enter blood sugar readings and medication or insulin into daily log take the blood sugar log to all doctor visits take the blood sugar meter to all doctor visits trim toenails straight across limit fast food meals to no more than 1 per week manage portion size prepare main meal at home 3 to 5 days each week read food labels for fat, fiber, carbohydrates and portion size set a realistic goal keep feet up while sitting wash and dry feet carefully every day wear comfortable, cotton socks Call your doctor  for signs of skin infection/ non healing- redness, foul smelling drainage, warmth, fever, if overall you are not feeling well Try to eat 3 meals per day around the same time each day Interventions Today    Flowsheet Row Most Recent Value  Chronic Disease   Chronic disease during today's visit Diabetes, Other  [home health, mobility, medicines, glucometer vs Freestyle (cost) annual year deductible]  General Interventions   General Interventions  Discussed/Reviewed General Interventions Reviewed, Doctor Visits, [facility], Horticulturist, commercial (DME)  Doctor Visits Discussed/Reviewed Doctor Visits Reviewed, PCP  Horticulturist, commercial (DME) Glucomoter  PCP/Specialist Visits Compliance with follow-up visit  Education Interventions   Education Provided Provided Education  [employer] deductible, [employer] energy savings plan for electricity]  Provided Verbal Education On Development worker, community, [employer]  Mental Health Interventions   Mental Health Discussed/Reviewed Mental Health Reviewed, Coping Strategies       Follow Up Plan: Telephone follow up appointment with care management team member scheduled for:        COMPLETED: RNCM: Care Management (HYPERTENSION) EXPECTED OUTCOME: SELF-MANAGE AND REDUCE SYMPTOMS OF HYPERTENSION       Current Barriers:  Knowledge Deficits related to Hypertension management Care Coordination needs related to pharmacy/ social work needs in a patient with Hypertension Chronic Disease Management support 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  No Advanced Directives in place- pt declines Patient reports she lives with spouse, has cane and walker for ambulation, spouse assists with cooking due to pt cannot stand up for long periods, pt has had no falls, spouse provides transportation Patient reports she is on payment plan for power bill and has difficulty affording food, receives 23$/ month food stamps Patient has blood pressure cuff but does not monitor blood pressure Patient reports she has all medications including albuterol and using as prescribed, pharmacist is working with pt BP Readings from Last 3 Encounters:  06/13/23 (!) 97/57  06/09/23 (!) 115/50  05/24/23 (!) 106/56     Planned Interventions: Evaluation of current treatment plan related to hypertension self management and patient's adherence to plan as established by provider. Denies any  fluctuations with her blood pressures. She is having fluid retention. Education provided on weighing self to see if she is having water shifts. Wants to get in to see the provider to see if she can get some help for the pain and discomfort she is experiencing in her legs due to the swelling.    Reviewed medications with patient and discussed importance of compliance. Working with the pharm D currently, appointment next week;  Counseled on the importance of exercise goals with target of 150 minutes per week Advised patient, providing education and rationale, to monitor blood pressure daily and record, calling PCP for findings outside established parameters;  Reviewed low sodium diet  Symptom Management: Take medications as prescribed   Attend all scheduled provider appointments Call pharmacy for medication refills 3-7 days in advance of running out of medications Attend church or other social activities Call provider office for new concerns or questions  Work with the social worker to address care coordination needs and will continue to work with the clinical team to address health care and disease management related needs check blood pressure 3 times per week choose a place to take my blood pressure (home, clinic or office, retail store) write blood pressure results in a log or diary learn about high blood pressure keep a blood pressure log keep all doctor appointments take medications for blood  pressure exactly as prescribed begin an exercise program report new symptoms to your doctor eat more whole grains, fruits and vegetables, lean meats and healthy fats Follow low sodium diet- read food labels for sodium content Limit/ avoid fast food  Follow Up Plan: Telephone follow up appointment with care management team member scheduled for:   12-08-2023 at 230 pm          Our next appointment is by telephone on 01/24/24 at 1130  Please call the care guide team at [phone] if you need  to cancel or reschedule your appointment.   If you are experiencing a Mental Health or Behavioral Health Crisis or need someone to talk to, please call the Suicide and Crisis Lifeline: 988 call the [place] National Suicide Prevention Lifeline: 951 263 3390 or TTY: 7876551763 TTY (843)288-9071) to talk to a trained counselor call 1-800-273-TALK (toll free, 24 hour hotline) call the [facility]: 651-549-5745 call 911   No preference for copy of avs  The patient has been provided with contact information for the care management team and has been advised to call with any health related questions or concerns.    Singleton Hickox L. Noelle Penner, RN, BSN, CCM [place]  [facility], [facility] RN Care Manager Direct Dial: [phone]  Fax: [phone]

## 2024-01-25 ENCOUNTER — Ambulatory Visit: Payer: Self-pay | Admitting: Family Medicine

## 2024-01-25 NOTE — Telephone Encounter (Signed)
 Copied from CRM [id number]. Topic: Clinical - Red Word Triage >> Jan 25, 2024  8:40 AM Fuller Mandril wrote: Red Word that prompted transfer to Nurse Triage: Burning and Hurting bladder, Having spasms   Chief Complaint: Urinary symptoms Symptoms: Pain/burning after urination Frequency: Ongoing for 1 week Pertinent Negatives: Patient denies urinating more frequently Disposition: [] ED /[] Urgent Care (no appt availability in office) / [x] Appointment(In office/virtual)/ []  [facility]/ [] Home Care/ [] Refused Recommended Disposition /[] [facility]/ []  Follow-up with PCP Additional Notes: Patient stated she is having pain and burning after she urinates. She also reported having moderate lower back pain and a temp of 100. Advised to take Tylenol or Ibuprofen to manage fevers. Offered to schedule same day appointment and patient declined. Appointment scheduled for tomorrow.   Reason for Disposition  Side (flank) or lower back pain present  Answer Assessment - Initial Assessment Questions 1. SYMPTOM: "What's the main symptom you're concerned about?" (e.g., frequency, incontinence)     Pain after urinating  2. ONSET: "When did the symptoms  start?"     One week ago  3. PAIN: "Is there any pain?" If Yes, ask: "How bad is it?" (Scale: 1-10; mild, moderate, severe)     Yes  4. OTHER SYMPTOMS: "Do you have any other symptoms?" (e.g., blood in urine, fever, flank pain, pain with urination)     Lower back pain, 5/10, sometimes there is an odor, fever: temp of 100 degrees  Protocols used: Urinary Symptoms-A-AH

## 2024-01-26 ENCOUNTER — Encounter: Payer: Self-pay | Admitting: Family Medicine

## 2024-01-26 ENCOUNTER — Ambulatory Visit (HOSPITAL_COMMUNITY)
Admission: RE | Admit: 2024-01-26 | Discharge: 2024-01-26 | Disposition: A | Discharge status: Home / Self Care | Source: Ambulatory Visit | Attending: Family Medicine | Admitting: Family Medicine

## 2024-01-26 ENCOUNTER — Ambulatory Visit (INDEPENDENT_AMBULATORY_CARE_PROVIDER_SITE_OTHER): Admitting: Family Medicine

## 2024-01-26 ENCOUNTER — Ambulatory Visit: Admitting: Nurse Practitioner

## 2024-01-26 VITALS — BP 102/57 | HR 87 | Temp 98.3°F | Ht 62.0 in | Wt 305.0 lb

## 2024-01-26 DIAGNOSIS — K439 Ventral hernia without obstruction or gangrene: Secondary | ICD-10-CM | POA: Diagnosis not present

## 2024-01-26 DIAGNOSIS — N281 Cyst of kidney, acquired: Secondary | ICD-10-CM | POA: Diagnosis not present

## 2024-01-26 DIAGNOSIS — R300 Dysuria: Secondary | ICD-10-CM | POA: Diagnosis not present

## 2024-01-26 DIAGNOSIS — R109 Unspecified abdominal pain: Secondary | ICD-10-CM | POA: Diagnosis not present

## 2024-01-26 DIAGNOSIS — R110 Nausea: Secondary | ICD-10-CM | POA: Diagnosis not present

## 2024-01-26 DIAGNOSIS — R0602 Shortness of breath: Secondary | ICD-10-CM | POA: Insufficient documentation

## 2024-01-26 LAB — POCT I-STAT CREATININE: Creatinine, Ser: 2.5 mg/dL — ABNORMAL HIGH (ref 0.44–1.00)

## 2024-01-26 LAB — MICROSCOPIC EXAMINATION
Renal Epithel, UA: NONE SEEN /HPF
WBC, UA: >30 /HPF — AB (ref 0–5)

## 2024-01-26 LAB — URINALYSIS, ROUTINE W REFLEX MICROSCOPIC
Bilirubin, UA: NEGATIVE
Glucose, UA: NEGATIVE
Ketones, UA: NEGATIVE
Nitrite, UA: NEGATIVE
Specific Gravity, UA: <1.005 — ABNORMAL LOW (ref 1.005–1.030)
Urobilinogen, Ur: 4 mg/dL — ABNORMAL HIGH (ref 0.2–1.0)
pH, UA: 5.5 (ref 5.0–7.5)

## 2024-01-26 MED ORDER — CEPHALEXIN 500 MG PO CAPS: 500.0000 mg | ORAL_CAPSULE | Freq: Two times a day (BID) | ORAL | 0 refills | Status: AC

## 2024-01-26 MED ORDER — FLUCONAZOLE 150 MG PO TABS: ORAL_TABLET | ORAL | 0 refills | Status: DC

## 2024-01-26 MED ORDER — IOHEXOL 300 MG/ML  SOLN
100.0000 mL | Freq: Once | INTRAMUSCULAR | Status: DC | PRN
Start: 2024-01-26 — End: 2024-01-27

## 2024-01-26 NOTE — Progress Notes (Signed)
 Subjective:  Patient ID: [name], female    DOB: 23-Jan-1961, 63 y.o.   MRN: [id number]  Patient Care Team: [name], DO as PCP - General (Family Medicine) Jonelle Sidle, MD as PCP - Cardiology (Cardiology) Levert Feinstein, MD as Consulting Physician (Neurology) Ranee Gosselin, MD as Consulting Physician (Orthopedic Surgery) Jonelle Sidle, MD as Consulting Physician (Cardiology) Danella Maiers, [facility] (Pharmacist)   Chief Complaint:  possible uti (Burning, pain/Labored breathing)   HPI: [name] is a 63 y.o. female presenting on 01/26/2024 for possible uti (Burning, pain/Labored breathing)  HPI 1. Dysuria States that she has burning with urination that started 2 weeks ago. States that is dark and has odor. Denies discharge,  States that she feels bad all over, cold chills, hot flashes. Endorses low back pain. She has shortness of breath and DOE. Has history of DOE, states that this is worse.  Has not tried anything OTC for symptoms. Reports decreased appetite and nausea    Relevant past medical, surgical, family, and social history reviewed and updated as indicated.  Allergies and medications reviewed and updated. Data reviewed: Chart in Epic.   Past Medical History:  Diagnosis Date   Arthritis    CAD (coronary artery disease)    BMS to circumflex 2007 - Dr. Juanda Chance   CKD (chronic kidney disease) stage 3, GFR 30-59 ml/min (HCC)    COPD (chronic obstructive pulmonary disease) (HCC)    Depression    Dyspnea    Essential hypertension    Falls frequently    GERD (gastroesophageal reflux disease)    HA (headache)    Hyperlipidemia    Hyperlipidemia associated with type 2 diabetes mellitus (HCC) 02/03/2011   Left-sided face pain    Morbid obesity (HCC)    Noncompliance    OSA (obstructive sleep apnea)    Uses CPAP   Type 2 diabetes mellitus (HCC)    Urinary incontinence    Urinary incontinence     Past Surgical History:  Procedure Laterality  Date   ABDOMINAL HERNIA REPAIR     ABDOMINAL HYSTERECTOMY     BACK SURGERY     BREAST SURGERY     biopsy per right breast; pt states has silver piece in for marking    CARPAL TUNNEL RELEASE Left 06/13/2023   Procedure: LEFT CARPAL TUNNEL RELEASE;  Surgeon: Oliver Barre, MD;  Location: [facility];  Service: Orthopedics;  Laterality: Left;   Cataract surgery Bilateral    CHOLECYSTECTOMY     COLONOSCOPY N/A 04/10/2013   Procedure: COLONOSCOPY;  Surgeon: Malissa Hippo, MD;  Location: [facility];  Service: Endoscopy;  Laterality: N/A;   CORONARY ANGIOPLASTY WITH STENT PLACEMENT  2012   HERNIA REPAIR     LEFT HEART CATH AND CORONARY ANGIOGRAPHY N/A 06/19/2020   Procedure: LEFT HEART CATH AND CORONARY ANGIOGRAPHY;  Surgeon: Lyn Records, MD;  Location: [facility];  Service: Cardiovascular;  Laterality: N/A;   LUMBAR LAMINECTOMY/DECOMPRESSION MICRODISCECTOMY N/A 04/25/2015   Procedure: CENTRAL LUMBAR /DECOMPRESSION L4-L5;  Surgeon: Ranee Gosselin, MD;  Location: [facility];  Service: Orthopedics;  Laterality: N/A;   TUBAL LIGATION      Social History   Socioeconomic History   Marital status: Married    Spouse name: [name]   Number of children: 5   Years of education: 10 th   Highest education level: 10th grade  Occupational History   Occupation: disabled     Comment: disabled  Tobacco Use   Smoking status: Every Day    Current packs/day: 1.00    Average packs/day: 1 pack/day for 51.7 years (51.7 ttl pk-yrs)    Types: Cigarettes    Start date: 05/14/1972    Passive exposure: Current   Smokeless tobacco: Never   Tobacco comments:    Smoking Cessation Classes, Services, Agencies & Resources Offered.  Vaping Use   Vaping status: Never Used  Substance and Sexual Activity   Alcohol use: No    Alcohol/week: 0.0 standard drinks of alcohol   Drug use: No   Sexual activity: Not Currently    Partners: Male    Birth control/protection: Surgical  Other Topics Concern   Not on  file  Social History Narrative   Patient lives at home with her husband. Patient is disabled.   Children live nearby   Patient has 10 th grade education.   Right handed.   Caffeine- one  cup of coffee and  One soda Dr.Pepper/ tea. daily   Social Drivers of Health   Financial Resource Strain: Medium Risk (01/10/2024)   Overall Financial Resource Strain (CARDIA)    Difficulty of Paying Living Expenses: Somewhat hard  Food Insecurity: No Food Insecurity (01/10/2024)   Hunger Vital Sign    Worried About Running Out of Food in the Last Year: Never true    Ran Out of Food in the Last Year: Never true  Transportation Needs: No Transportation Needs (01/10/2024)   PRAPARE - Administrator, Civil Service (Medical): No    Lack of Transportation (Non-Medical): No  Physical Activity: Inactive (01/10/2024)   Exercise Vital Sign    Days of Exercise per Week: 0 days    Minutes of Exercise per Session: 0 min  Stress: No Stress Concern Present (01/10/2024)   [employer] of Occupational Health - Occupational Stress Questionnaire    Feeling of Stress : Not at all  Social Connections: Moderately Integrated (01/10/2024)   Social Connection and Isolation Panel [NHANES]    Frequency of Communication with Friends and Family: More than three times a week    Frequency of Social Gatherings with Friends and Family: Three times a week    Attends Religious Services: More than 4 times per year    Active Member of Clubs or Organizations: No    Attends Banker Meetings: Never    Marital Status: Married  Catering manager Violence: Not At Risk (01/10/2024)   Humiliation, Afraid, Rape, and Kick questionnaire    Fear of Current or Ex-Partner: No    Emotionally Abused: No    Physically Abused: No    Sexually Abused: No    Outpatient Encounter Medications as of 01/26/2024  Medication Sig   acetaminophen (TYLENOL) 325 MG tablet Take 975 mg by mouth every 8 (eight) hours as needed.   albuterol  (VENTOLIN HFA) 108 (90 Base) MCG/ACT inhaler INHALE 2 PUFFS BY MOUTH EVERY 4 HOURS AS NEEDED FOR WHEEZING OR SHORTNESS OF BREATH (FOR RESCUE)   Alpha-Lipoic Acid 600 MG CAPS Take 1 capsule (600 mg total) by mouth daily. For diabetic neuropathy   aspirin EC 325 MG tablet Take 325 mg by mouth daily.   Blood Glucose Monitoring Suppl (ONETOUCH VERIO) w/Device KIT Use to test blood sugar twice daily as directed; DX: E11.69   Continuous Glucose Sensor (FREESTYLE LIBRE 3 PLUS SENSOR) MISC Change sensor every 15 days. DX: E11.65   furosemide (LASIX) 40 MG tablet TAKE 1 TABLET BY MOUTH ONCE DAILY AS NEEDED  FOR EDEMA OR FLUID   gabapentin (NEURONTIN) 600 MG tablet TAKE 1 & 1/2 (ONE & ONE-HALF) TABLETS BY MOUTH THREE TIMES DAILY   glucose blood (ONETOUCH VERIO) test strip Use to test blood sugar twice daily as directed; DX: E11.69   Insulin Glargine (BASAGLAR KWIKPEN) 100 UNIT/ML Inject 45-50 Units into the skin at bedtime. May increase up to 50 units if instructed by your provider.   Insulin Pen Needle 29G X MISC Use to inject insulin daily as directed. DX:E11.65; please profile for future needs   methocarbamol (ROBAXIN) 500 MG tablet Take 1 tablet (500 mg total) by mouth every 6 (six) hours as needed for muscle spasms.   metoprolol tartrate (LOPRESSOR) 25 MG tablet Take 1 tablet (25 mg total) by mouth 2 (two) times daily.   nitroGLYCERIN (NITROSTAT) 0.4 MG SL tablet Place 1 tablet (0.4 mg total) under the tongue every 5 (five) minutes x 3 doses as needed for chest pain (if no relief after 3rd dose, proceed to ED or call 911).   omeprazole (PRILOSEC) 40 MG capsule Take 1 capsule (40 mg total) by mouth daily. For heartburn   OneTouch Delica Lancets 30G MISC Use to test blood sugar twice daily as directed; DX: E11.69   oxybutynin (DITROPAN XL) 5 MG 24 hr tablet Take 1 tablet (5 mg total) by mouth at bedtime. For bladder   rosuvastatin (CRESTOR) 20 MG tablet Take 1 tablet (20 mg total) by mouth at bedtime.    Semaglutide 14 MG TABS Take 14 mg by mouth daily.   sertraline (ZOLOFT) 50 MG tablet Take 1 tablet (50 mg total) by mouth daily. Daily for depression/ anxiety   telmisartan (MICARDIS) 40 MG tablet Take 1 tablet (40 mg total) by mouth daily.   Vitamin D, Ergocalciferol, (DRISDOL) 1.25 MG (50000 UNIT) CAPS capsule TAKE 1 CAPSULE BY MOUTH ONCE A WEEK, THEN TAKE OTC VITAMIN D 800IU DAILY   No facility-administered encounter medications on file as of 01/26/2024.    Allergies  Allergen Reactions   Duloxetine    Farxiga [Dapagliflozin]     RECURRENT YEAST INFECTIONS   Morphine Nausea And Vomiting   Trulicity [Dulaglutide]     GI adverse effects (okay on Rybelsus)    Review of Systems As per HPI  Objective:  BP (!) 102/57   Pulse 87   Temp 98.3 F (36.8 C) (Oral)   Ht 5\' 2"  (1.575 m)   Wt (!) 305 lb (138.3 kg)   SpO2 94%   BMI 55.79 kg/m    Wt Readings from Last 3 Encounters:  01/10/24 (!) 301 lb (136.5 kg)  12/09/23 (!) 301 lb (136.5 kg)  11/07/23 295 lb (133.8 kg)   Physical Exam Constitutional:      General: She is awake. She is not in acute distress.    Appearance: Normal appearance. She is well-developed and well-groomed. She is obese. She is ill-appearing and diaphoretic. She is not toxic-appearing.  Cardiovascular:     Rate and Rhythm: Normal rate and regular rhythm.     Pulses: Normal pulses.          Radial pulses are 2+ on the right side and 2+ on the left side.       Posterior tibial pulses are 2+ on the right side and 2+ on the left side.     Heart sounds: Normal heart sounds. No murmur heard.    No gallop.  Pulmonary:     Effort: Tachypnea and accessory muscle usage present. No  respiratory distress.     Breath sounds: No stridor. No wheezing, rhonchi or rales.     Comments: Increased WOB Abdominal:     General: Bowel sounds are normal.     Palpations: Abdomen is soft.     Tenderness: There is left CVA tenderness. There is no right CVA tenderness or guarding.  Negative signs include Murphy's sign.  Musculoskeletal:     Cervical back: Full passive range of motion without pain and neck supple.     Right lower leg: No edema.     Left lower leg: No edema.  Skin:    General: Skin is warm.     Capillary Refill: Capillary refill takes less than 2 seconds.  Neurological:     General: No focal deficit present.     Mental Status: She is alert, oriented to person, place, and time and easily aroused. Mental status is at baseline.     GCS: GCS eye subscore is 4. GCS verbal subscore is 5. GCS motor subscore is 6.     Motor: No weakness.  Psychiatric:        Attention and Perception: Attention and perception normal.        Mood and Affect: Mood and affect normal.        Speech: Speech normal.        Behavior: Behavior normal. Behavior is cooperative.        Thought Content: Thought content normal. Thought content does not include homicidal or suicidal ideation. Thought content does not include homicidal or suicidal plan.        Cognition and Memory: Cognition and memory normal.        Judgment: Judgment normal.     Results for orders placed or performed in visit on 12/09/23  Bayer DCA Hb A1c Waived   Collection Time: 12/09/23  2:28 PM  Result Value Ref Range   HB A1C (BAYER DCA - WAIVED) 6.8 (H) 4.8 - 5.6 %  Renal Function Panel   Collection Time: 12/09/23  2:33 PM  Result Value Ref Range   Glucose 79 70 - 99 mg/dL   BUN 21 8 - 27 mg/dL   Creatinine, Ser 4.09 (H) 0.57 - 1.00 mg/dL   eGFR 36 (L) >81 XB/JYN/8.29   BUN/Creatinine Ratio 13 12 - 28   Sodium 140 134 - 144 mmol/L   Potassium 4.4 3.5 - 5.2 mmol/L   Chloride 101 96 - 106 mmol/L   CO2 23 20 - 29 mmol/L   Calcium 9.1 8.7 - 10.3 mg/dL   Phosphorus 3.0 3.0 - 4.3 mg/dL   Albumin 4.1 3.9 - 4.9 g/dL  Urine Culture   Collection Time: 12/09/23  2:41 PM   Specimen: Urine   UR  Result Value Ref Range   Urine Culture, Routine Final report (A)    Organism ID, Bacteria Escherichia coli (A)     Antimicrobial Susceptibility Comment   Microscopic Examination   Collection Time: 12/09/23  2:41 PM   Urine  Result Value Ref Range   WBC, UA 0-5 0 - 5 /hpf   RBC, Urine None seen 0 - 2 /hpf   Epithelial Cells (non renal) None seen 0 - 10 /hpf   Renal Epithel, UA None seen None seen /hpf   Bacteria, UA Moderate (A) None seen/Few   Yeast, UA None seen None seen  Urinalysis, Routine w reflex microscopic   Collection Time: 12/09/23  2:41 PM  Result Value Ref Range   Specific Gravity, UA <1.005 (  L) 1.005 - 1.030   pH, UA 5.5 5.0 - 7.5   Color, UA Yellow Yellow   Appearance Ur Clear Clear   Leukocytes,UA 1+ (A) Negative   Protein,UA Negative Negative/Trace   Glucose, UA Negative Negative   Ketones, UA Negative Negative   RBC, UA Negative Negative   Bilirubin, UA Negative Negative   Urobilinogen, Ur 0.2 0.2 - 1.0 mg/dL   Nitrite, UA Negative Negative   Microscopic Examination See below:     Pertinent labs & imaging results that were available during my care of the patient were reviewed by me and considered in my medical decision making.  Assessment & Plan:  Jeaninne was seen today for possible uti.  Diagnoses and all orders for this visit:  1. Dysuria (Primary) Based on UA, will treat with medications as below for suspected UTI with yeast infection. Will await results of culture. Given symptoms of dyspnea, flank pain, CVA tenderness, and nausea, concern for pyelonephritis. Imaging as below. Will communicate results to patient once available. Will await results to determine next steps.  Patient states that she needs to have her daughter drive her, she is currently here with husband. Patient left to contact daughter. Instructed her to answer phone calls for image scheduling.  - Urinalysis, Routine w reflex microscopic - Urine Culture - CT ABDOMEN PELVIS W CONTRAST; Future - fluconazole (DIFLUCAN) 150 MG tablet; 1 po q week x 4 weeks  Dispense: 4 tablet; Refill: 0 - cephALEXin (KEFLEX)  500 MG capsule; Take 1 capsule (500 mg total) by mouth 2 (two) times daily for 7 days.  Dispense: 14 capsule; Refill: 0  2. Shortness of breath As above.  - CT ABDOMEN PELVIS W CONTRAST; Future  3. Flank pain As above.  - CT ABDOMEN PELVIS W CONTRAST; Future  4. Nausea As above.  - CT ABDOMEN PELVIS W CONTRAST; Future   Continue all other maintenance medications.  Follow up plan: Return if symptoms worsen or fail to improve.   Continue healthy lifestyle choices, including diet (rich in fruits, vegetables, and lean proteins, and low in salt and simple carbohydrates) and exercise (at least 30 minutes of moderate physical activity daily).  Written and verbal instructions provided   The above assessment and management plan was discussed with the patient. The patient verbalized understanding of and has agreed to the management plan. Patient is aware to call the clinic if they develop any new symptoms or if symptoms persist or worsen. Patient is aware when to return to the clinic for a follow-up visit. Patient educated on when it is appropriate to go to the emergency department.   Neale Burly, DNP-FNP [facility] [address] [place], [place] [address] [phone]

## 2024-01-26 NOTE — Addendum Note (Signed)
 Addended by: Neale Burly on: 01/26/2024 02:54 PM   Modules accepted: Orders

## 2024-01-26 NOTE — Progress Notes (Signed)
 No acute findings. Start antibiotics, will await results of culture. Follow up if your symptoms worsen or continue.

## 2024-01-29 LAB — URINE CULTURE

## 2024-01-31 ENCOUNTER — Other Ambulatory Visit (HOSPITAL_COMMUNITY): Payer: Self-pay | Admitting: Family Medicine

## 2024-01-31 DIAGNOSIS — F32A Depression, unspecified: Secondary | ICD-10-CM | POA: Diagnosis not present

## 2024-01-31 DIAGNOSIS — Z9181 History of falling: Secondary | ICD-10-CM | POA: Diagnosis not present

## 2024-01-31 DIAGNOSIS — F17210 Nicotine dependence, cigarettes, uncomplicated: Secondary | ICD-10-CM | POA: Diagnosis not present

## 2024-01-31 DIAGNOSIS — Z794 Long term (current) use of insulin: Secondary | ICD-10-CM | POA: Diagnosis not present

## 2024-01-31 DIAGNOSIS — I2510 Atherosclerotic heart disease of native coronary artery without angina pectoris: Secondary | ICD-10-CM | POA: Diagnosis not present

## 2024-01-31 DIAGNOSIS — K219 Gastro-esophageal reflux disease without esophagitis: Secondary | ICD-10-CM | POA: Diagnosis not present

## 2024-01-31 DIAGNOSIS — R899 Unspecified abnormal finding in specimens from other organs, systems and tissues: Secondary | ICD-10-CM

## 2024-01-31 DIAGNOSIS — Z6841 Body Mass Index (BMI) 40.0 and over, adult: Secondary | ICD-10-CM | POA: Diagnosis not present

## 2024-01-31 DIAGNOSIS — Z7982 Long term (current) use of aspirin: Secondary | ICD-10-CM | POA: Diagnosis not present

## 2024-01-31 DIAGNOSIS — G4733 Obstructive sleep apnea (adult) (pediatric): Secondary | ICD-10-CM | POA: Diagnosis not present

## 2024-01-31 DIAGNOSIS — I890 Lymphedema, not elsewhere classified: Secondary | ICD-10-CM | POA: Diagnosis not present

## 2024-01-31 DIAGNOSIS — E1169 Type 2 diabetes mellitus with other specified complication: Secondary | ICD-10-CM | POA: Diagnosis not present

## 2024-01-31 DIAGNOSIS — Z5982 Transportation insecurity: Secondary | ICD-10-CM | POA: Diagnosis not present

## 2024-01-31 DIAGNOSIS — E785 Hyperlipidemia, unspecified: Secondary | ICD-10-CM | POA: Diagnosis not present

## 2024-01-31 NOTE — Progress Notes (Signed)
 Elevated creatinine. Recommend patient increase hydration, avoid nephrotoxic medications. Would like to repeat BMP in 2-4 weeks.

## 2024-01-31 NOTE — Progress Notes (Signed)
 Positive E. Coli on culture. Continue current abx. Follow up if symptoms continue or worsen.

## 2024-02-07 ENCOUNTER — Ambulatory Visit: Payer: PPO | Admitting: Cardiology

## 2024-02-08 DIAGNOSIS — K219 Gastro-esophageal reflux disease without esophagitis: Secondary | ICD-10-CM | POA: Diagnosis not present

## 2024-02-08 DIAGNOSIS — E1169 Type 2 diabetes mellitus with other specified complication: Secondary | ICD-10-CM | POA: Diagnosis not present

## 2024-02-08 DIAGNOSIS — E785 Hyperlipidemia, unspecified: Secondary | ICD-10-CM | POA: Diagnosis not present

## 2024-02-08 DIAGNOSIS — I890 Lymphedema, not elsewhere classified: Secondary | ICD-10-CM | POA: Diagnosis not present

## 2024-02-08 DIAGNOSIS — Z9181 History of falling: Secondary | ICD-10-CM | POA: Diagnosis not present

## 2024-02-08 DIAGNOSIS — G4733 Obstructive sleep apnea (adult) (pediatric): Secondary | ICD-10-CM | POA: Diagnosis not present

## 2024-02-08 DIAGNOSIS — I2510 Atherosclerotic heart disease of native coronary artery without angina pectoris: Secondary | ICD-10-CM | POA: Diagnosis not present

## 2024-02-08 DIAGNOSIS — Z5982 Transportation insecurity: Secondary | ICD-10-CM | POA: Diagnosis not present

## 2024-02-08 DIAGNOSIS — Z7982 Long term (current) use of aspirin: Secondary | ICD-10-CM | POA: Diagnosis not present

## 2024-02-08 DIAGNOSIS — F32A Depression, unspecified: Secondary | ICD-10-CM | POA: Diagnosis not present

## 2024-02-08 DIAGNOSIS — F17210 Nicotine dependence, cigarettes, uncomplicated: Secondary | ICD-10-CM | POA: Diagnosis not present

## 2024-02-08 DIAGNOSIS — Z794 Long term (current) use of insulin: Secondary | ICD-10-CM | POA: Diagnosis not present

## 2024-02-08 DIAGNOSIS — Z6841 Body Mass Index (BMI) 40.0 and over, adult: Secondary | ICD-10-CM | POA: Diagnosis not present

## 2024-02-14 ENCOUNTER — Ambulatory Visit: Admitting: Family Medicine

## 2024-02-14 ENCOUNTER — Encounter: Payer: Self-pay | Admitting: Family Medicine

## 2024-02-14 VITALS — BP 104/56 | HR 84 | Temp 97.5°F | Ht 62.0 in | Wt 295.4 lb

## 2024-02-14 DIAGNOSIS — R6883 Chills (without fever): Secondary | ICD-10-CM | POA: Diagnosis not present

## 2024-02-14 DIAGNOSIS — K219 Gastro-esophageal reflux disease without esophagitis: Secondary | ICD-10-CM | POA: Diagnosis not present

## 2024-02-14 DIAGNOSIS — G4733 Obstructive sleep apnea (adult) (pediatric): Secondary | ICD-10-CM | POA: Diagnosis not present

## 2024-02-14 DIAGNOSIS — Z9181 History of falling: Secondary | ICD-10-CM | POA: Diagnosis not present

## 2024-02-14 DIAGNOSIS — N3001 Acute cystitis with hematuria: Secondary | ICD-10-CM | POA: Diagnosis not present

## 2024-02-14 DIAGNOSIS — N390 Urinary tract infection, site not specified: Secondary | ICD-10-CM

## 2024-02-14 DIAGNOSIS — Z5982 Transportation insecurity: Secondary | ICD-10-CM | POA: Diagnosis not present

## 2024-02-14 DIAGNOSIS — I2510 Atherosclerotic heart disease of native coronary artery without angina pectoris: Secondary | ICD-10-CM | POA: Diagnosis not present

## 2024-02-14 DIAGNOSIS — I890 Lymphedema, not elsewhere classified: Secondary | ICD-10-CM | POA: Diagnosis not present

## 2024-02-14 DIAGNOSIS — E1169 Type 2 diabetes mellitus with other specified complication: Secondary | ICD-10-CM | POA: Diagnosis not present

## 2024-02-14 DIAGNOSIS — Z6841 Body Mass Index (BMI) 40.0 and over, adult: Secondary | ICD-10-CM | POA: Diagnosis not present

## 2024-02-14 DIAGNOSIS — E785 Hyperlipidemia, unspecified: Secondary | ICD-10-CM | POA: Diagnosis not present

## 2024-02-14 DIAGNOSIS — R300 Dysuria: Secondary | ICD-10-CM | POA: Diagnosis not present

## 2024-02-14 DIAGNOSIS — F32A Depression, unspecified: Secondary | ICD-10-CM | POA: Diagnosis not present

## 2024-02-14 DIAGNOSIS — F17210 Nicotine dependence, cigarettes, uncomplicated: Secondary | ICD-10-CM | POA: Diagnosis not present

## 2024-02-14 DIAGNOSIS — R630 Anorexia: Secondary | ICD-10-CM

## 2024-02-14 DIAGNOSIS — Z7982 Long term (current) use of aspirin: Secondary | ICD-10-CM | POA: Diagnosis not present

## 2024-02-14 DIAGNOSIS — Z794 Long term (current) use of insulin: Secondary | ICD-10-CM | POA: Diagnosis not present

## 2024-02-14 LAB — URINALYSIS, ROUTINE W REFLEX MICROSCOPIC
Bilirubin, UA: NEGATIVE
Glucose, UA: NEGATIVE
Ketones, UA: NEGATIVE
Nitrite, UA: POSITIVE — AB
Specific Gravity, UA: 1.01 (ref 1.005–1.030)
Urobilinogen, Ur: 2 mg/dL — ABNORMAL HIGH (ref 0.2–1.0)
pH, UA: 5.5 (ref 5.0–7.5)

## 2024-02-14 LAB — MICROSCOPIC EXAMINATION
Renal Epithel, UA: NONE SEEN /HPF
WBC, UA: >30 /HPF — AB (ref 0–5)
Yeast, UA: NONE SEEN

## 2024-02-14 MED ORDER — CIPROFLOXACIN HCL 500 MG PO TABS: 500.0000 mg | ORAL_TABLET | Freq: Two times a day (BID) | ORAL | 0 refills | Status: DC

## 2024-02-14 NOTE — Progress Notes (Signed)
 Acute cysit     Subjective:  Patient ID: [name], female    DOB: 1961-06-08, 63 y.o.   MRN: [id number]  Patient Care Team: [name], DO as PCP - General (Family Medicine) Jonelle Sidle, MD as PCP - Cardiology (Cardiology) Levert Feinstein, MD as Consulting Physician (Neurology) Ranee Gosselin, MD as Consulting Physician (Orthopedic Surgery) Jonelle Sidle, MD as Consulting Physician (Cardiology) Danella Maiers, [facility] (Pharmacist)   Chief Complaint:  Chills and Anorexia   HPI: [name] is a 63 y.o. female presenting on 02/14/2024 for Chills and Anorexia   Discussed the use of AI scribe software for clinical note transcription with the patient, who gave verbal consent to proceed.  History of Present Illness   The patient presents with chills, loss of appetite, and low blood pressure. The appointment was scheduled by her home nurse due to concerns about low blood pressure.  She has been experiencing chills and a loss of appetite since Saturday, stating 'I haven't been eating' and 'I have been freezing.' Her husband encouraged her to eat a small amount of hamburger and potato last night. No fever has been noted, and she has not taken any Motrin or Tylenol today.  Her blood pressure has been dropping, as noted by her home nurse who visits weekly to wrap her legs. Her blood pressure today is 104/56, which is low normal.  She describes ongoing burning and pain, stating 'it still burns and it still hurts.' She was treated for a urinary tract infection last month and experiences occasional burning and shooting pain. She urinates three to four times a day and is drinking plenty of water to stay hydrated.  She is currently taking a medication for vaginitis, with the last dose scheduled for Thursday. She has not checked her blood sugars in the last couple of days.  Her granddaughter was diagnosed with COVID on Saturday, but she has not been in contact with her for over a  year.          Relevant past medical, surgical, family, and social history reviewed and updated as indicated.  Allergies and medications reviewed and updated. Data reviewed: Chart in Epic.   Past Medical History:  Diagnosis Date   Arthritis    CAD (coronary artery disease)    BMS to circumflex 2007 - Dr. Juanda Chance   CKD (chronic kidney disease) stage 3, GFR 30-59 ml/min (HCC)    COPD (chronic obstructive pulmonary disease) (HCC)    Depression    Dyspnea    Essential hypertension    Falls frequently    GERD (gastroesophageal reflux disease)    HA (headache)    Hyperlipidemia    Hyperlipidemia associated with type 2 diabetes mellitus (HCC) 02/03/2011   Left-sided face pain    Morbid obesity (HCC)    Noncompliance    OSA (obstructive sleep apnea)    Uses CPAP   Type 2 diabetes mellitus (HCC)    Urinary incontinence    Urinary incontinence     Past Surgical History:  Procedure Laterality Date   ABDOMINAL HERNIA REPAIR     ABDOMINAL HYSTERECTOMY     BACK SURGERY     BREAST SURGERY     biopsy per right breast; pt states has silver piece in for marking    CARPAL TUNNEL RELEASE Left 06/13/2023   Procedure: LEFT CARPAL TUNNEL RELEASE;  Surgeon: Oliver Barre, MD;  Location: [facility];  Service: Orthopedics;  Laterality: Left;   Cataract surgery  Bilateral    CHOLECYSTECTOMY     COLONOSCOPY N/A 04/10/2013   Procedure: COLONOSCOPY;  Surgeon: Malissa Hippo, MD;  Location: [facility];  Service: Endoscopy;  Laterality: N/A;   CORONARY ANGIOPLASTY WITH STENT PLACEMENT  2012   HERNIA REPAIR     LEFT HEART CATH AND CORONARY ANGIOGRAPHY N/A 06/19/2020   Procedure: LEFT HEART CATH AND CORONARY ANGIOGRAPHY;  Surgeon: Lyn Records, MD;  Location: [facility];  Service: Cardiovascular;  Laterality: N/A;   LUMBAR LAMINECTOMY/DECOMPRESSION MICRODISCECTOMY N/A 04/25/2015   Procedure: CENTRAL LUMBAR /DECOMPRESSION L4-L5;  Surgeon: Ranee Gosselin, MD;  Location: [facility];  Service:  Orthopedics;  Laterality: N/A;   TUBAL LIGATION      Social History   Socioeconomic History   Marital status: Married    Spouse name: [name]   Number of children: 5   Years of education: 10 th   Highest education level: 10th grade  Occupational History   Occupation: disabled     Comment: disabled  Tobacco Use   Smoking status: Every Day    Current packs/day: 1.00    Average packs/day: 1 pack/day for 51.8 years (51.8 ttl pk-yrs)    Types: Cigarettes    Start date: 05/14/1972    Passive exposure: Current   Smokeless tobacco: Never   Tobacco comments:    Smoking Cessation Classes, Services, Agencies & Resources Offered.  Vaping Use   Vaping status: Never Used  Substance and Sexual Activity   Alcohol use: No    Alcohol/week: 0.0 standard drinks of alcohol   Drug use: No   Sexual activity: Not Currently    Partners: Male    Birth control/protection: Surgical  Other Topics Concern   Not on file  Social History Narrative   Patient lives at home with her husband. Patient is disabled.   Children live nearby   Patient has 10 th grade education.   Right handed.   Caffeine- one  cup of coffee and  One soda Dr.Pepper/ tea. daily   Social Drivers of Health   Financial Resource Strain: Medium Risk (01/10/2024)   Overall Financial Resource Strain (CARDIA)    Difficulty of Paying Living Expenses: Somewhat hard  Food Insecurity: No Food Insecurity (01/10/2024)   Hunger Vital Sign    Worried About Running Out of Food in the Last Year: Never true    Ran Out of Food in the Last Year: Never true  Transportation Needs: No Transportation Needs (01/10/2024)   PRAPARE - Administrator, Civil Service (Medical): No    Lack of Transportation (Non-Medical): No  Physical Activity: Inactive (01/10/2024)   Exercise Vital Sign    Days of Exercise per Week: 0 days    Minutes of Exercise per Session: 0 min  Stress: No Stress Concern Present (01/10/2024)   [employer] of Occupational  Health - Occupational Stress Questionnaire    Feeling of Stress : Not at all  Social Connections: Moderately Integrated (01/10/2024)   Social Connection and Isolation Panel [NHANES]    Frequency of Communication with Friends and Family: More than three times a week    Frequency of Social Gatherings with Friends and Family: Three times a week    Attends Religious Services: More than 4 times per year    Active Member of Clubs or Organizations: No    Attends Banker Meetings: Never    Marital Status: Married  Catering manager Violence: Not At Risk (01/10/2024)   Humiliation, Afraid, Rape, and  Kick questionnaire    Fear of Current or Ex-Partner: No    Emotionally Abused: No    Physically Abused: No    Sexually Abused: No    Outpatient Encounter Medications as of 02/14/2024  Medication Sig   acetaminophen (TYLENOL) 325 MG tablet Take 975 mg by mouth every 8 (eight) hours as needed.   albuterol (VENTOLIN HFA) 108 (90 Base) MCG/ACT inhaler INHALE 2 PUFFS BY MOUTH EVERY 4 HOURS AS NEEDED FOR WHEEZING OR SHORTNESS OF BREATH (FOR RESCUE)   Alpha-Lipoic Acid 600 MG CAPS Take 1 capsule (600 mg total) by mouth daily. For diabetic neuropathy   aspirin EC 325 MG tablet Take 325 mg by mouth daily.   Blood Glucose Monitoring Suppl (ONETOUCH VERIO) w/Device KIT Use to test blood sugar twice daily as directed; DX: E11.69   ciprofloxacin (CIPRO) 500 MG tablet Take 1 tablet (500 mg total) by mouth 2 (two) times daily for 7 days.   Continuous Glucose Sensor (FREESTYLE LIBRE 3 PLUS SENSOR) MISC Change sensor every 15 days. DX: E11.65   furosemide (LASIX) 40 MG tablet TAKE 1 TABLET BY MOUTH ONCE DAILY AS NEEDED FOR EDEMA OR FLUID   gabapentin (NEURONTIN) 600 MG tablet TAKE 1 & 1/2 (ONE & ONE-HALF) TABLETS BY MOUTH THREE TIMES DAILY   glucose blood (ONETOUCH VERIO) test strip Use to test blood sugar twice daily as directed; DX: E11.69   Insulin Glargine (BASAGLAR KWIKPEN) 100 UNIT/ML Inject 45-50 Units  into the skin at bedtime. May increase up to 50 units if instructed by your provider.   Insulin Pen Needle 29G X MISC Use to inject insulin daily as directed. DX:E11.65; please profile for future needs   methocarbamol (ROBAXIN) 500 MG tablet Take 1 tablet (500 mg total) by mouth every 6 (six) hours as needed for muscle spasms.   metoprolol tartrate (LOPRESSOR) 25 MG tablet Take 1 tablet (25 mg total) by mouth 2 (two) times daily.   nitroGLYCERIN (NITROSTAT) 0.4 MG SL tablet Place 1 tablet (0.4 mg total) under the tongue every 5 (five) minutes x 3 doses as needed for chest pain (if no relief after 3rd dose, proceed to ED or call 911).   omeprazole (PRILOSEC) 40 MG capsule Take 1 capsule (40 mg total) by mouth daily. For heartburn   OneTouch Delica Lancets 30G MISC Use to test blood sugar twice daily as directed; DX: E11.69   oxybutynin (DITROPAN XL) 5 MG 24 hr tablet Take 1 tablet (5 mg total) by mouth at bedtime. For bladder   rosuvastatin (CRESTOR) 20 MG tablet Take 1 tablet (20 mg total) by mouth at bedtime.   Semaglutide 14 MG TABS Take 14 mg by mouth daily.   sertraline (ZOLOFT) 50 MG tablet Take 1 tablet (50 mg total) by mouth daily. Daily for depression/ anxiety   telmisartan (MICARDIS) 40 MG tablet Take 1 tablet (40 mg total) by mouth daily.   Vitamin D, Ergocalciferol, (DRISDOL) 1.25 MG (50000 UNIT) CAPS capsule TAKE 1 CAPSULE BY MOUTH ONCE A WEEK, THEN TAKE OTC VITAMIN D 800IU DAILY   [DISCONTINUED] fluconazole (DIFLUCAN) 150 MG tablet 1 po q week x 4 weeks (Patient not taking: Reported on 02/14/2024)   No facility-administered encounter medications on file as of 02/14/2024.    Allergies  Allergen Reactions   Duloxetine    Farxiga [Dapagliflozin]     RECURRENT YEAST INFECTIONS   Morphine Nausea And Vomiting   Trulicity [Dulaglutide]     GI adverse effects (okay on Rybelsus)  Pertinent ROS per HPI, otherwise unremarkable      Objective:  BP (!) 104/56   Pulse 84   Temp  (!) 97.5 F (36.4 C)   Ht 5\' 2"  (1.575 m)   Wt 295 lb 6.4 oz (134 kg)   SpO2 95%   BMI 54.03 kg/m    Wt Readings from Last 3 Encounters:  02/14/24 295 lb 6.4 oz (134 kg)  01/26/24 (!) 305 lb (138.3 kg)  01/10/24 (!) 301 lb (136.5 kg)    Physical Exam Vitals and nursing note reviewed.  Constitutional:      General: She is not in acute distress.    Appearance: Normal appearance. She is morbidly obese. She is not ill-appearing, toxic-appearing or diaphoretic.  HENT:     Head: Normocephalic and atraumatic.     Nose: Nose normal.     Mouth/Throat:     Mouth: Mucous membranes are moist.  Eyes:     Conjunctiva/sclera: Conjunctivae normal.     Pupils: Pupils are equal, round, and reactive to light.  Cardiovascular:     Rate and Rhythm: Normal rate and regular rhythm.     Pulses: Normal pulses.     Heart sounds: Normal heart sounds.  Pulmonary:     Effort: Pulmonary effort is normal.     Breath sounds: Normal breath sounds.  Abdominal:     General: Bowel sounds are normal.     Palpations: Abdomen is soft.     Tenderness: There is no right CVA tenderness or left CVA tenderness.  Musculoskeletal:     Right lower leg: Edema present.     Left lower leg: Edema present.  Skin:    General: Skin is warm and dry.     Capillary Refill: Capillary refill takes less than 2 seconds.  Neurological:     General: No focal deficit present.     Mental Status: She is alert and oriented to person, place, and time.     Gait: Gait abnormal (using cane).  Psychiatric:        Mood and Affect: Mood normal.        Behavior: Behavior normal.        Thought Content: Thought content normal.        Judgment: Judgment normal.     Results for orders placed or performed during the hospital encounter of 01/26/24  I-STAT creatinine   Collection Time: 01/26/24  2:39 PM  Result Value Ref Range   Creatinine, Ser 2.50 (H) 0.44 - 1.00 mg/dL       Pertinent labs & imaging results that were available  during my care of the patient were reviewed by me and considered in my medical decision making.  Assessment & Plan:  Desteny was seen today for chills and anorexia.  Diagnoses and all orders for this visit:  Chills -     COVID-19, Flu A+B and RSV -     CMP14+EGFR -     CBC with Differential/Platelet  Decreased appetite -     CMP14+EGFR -     CBC with Differential/Platelet  Dysuria -     Urine Culture -     Urinalysis, Routine w reflex microscopic -     CMP14+EGFR -     CBC with Differential/Platelet -     ciprofloxacin (CIPRO) 500 MG tablet; Take 1 tablet (500 mg total) by mouth 2 (two) times daily for 7 days. -     Ambulatory referral to Urology  Acute cystitis with hematuria -  ciprofloxacin (CIPRO) 500 MG tablet; Take 1 tablet (500 mg total) by mouth 2 (two) times daily for 7 days. -     Ambulatory referral to Urology  Recurrent UTI -     ciprofloxacin (CIPRO) 500 MG tablet; Take 1 tablet (500 mg total) by mouth 2 (two) times daily for 7 days. -     Ambulatory referral to Urology     Assessment and Plan    Urinary Tract Infection (UTI) Persistent burning and pain suggest unresolved UTI. Last treated a month ago. Differential includes incomplete treatment or resistant organism. Urine culture needed to confirm infection status. - Obtain urine culture to assess for persistent infection - If urine culture indicates infection, prescribe appropriate antibiotics - Advise hospital visit if symptoms worsen  Chills and Loss of Appetite Chills and loss of appetite since Saturday. Differential includes viral infection (COVID-19, influenza, RSV) or other systemic infection. No fever noted. Hydration status appears adequate. Viral testing and blood work necessary to identify underlying cause. - Perform blood work to check kidney and liver function, electrolytes, and blood count - Conduct viral testing for COVID-19, influenza, and RSV - Advise use of acetaminophen for fever and  chills if needed - Encourage hydration and gradual increase in food intake  Hypotension Blood pressure recorded at 104/56, considered low normal. Previous readings were lower, indicating some improvement. Likely related to decreased oral intake and possible infection. - Monitor blood pressure - Encourage adequate hydration and nutritional intake  Diabetes Mellitus Blood sugars not monitored recently. Potential impact on overall health status due to lack of monitoring. Regular monitoring essential for management. - Encourage regular monitoring of blood glucose levels  Follow-up Follow-up required to assess response to treatment and review test results. - Review urine culture, blood work, and viral testing results - Advise immediate care if symptoms worsen or new symptoms develop as there are concerns for possible pyelonephritis or urosepsis.          Continue all other maintenance medications.  Follow up plan: Return if symptoms worsen or fail to improve.   Continue healthy lifestyle choices, including diet (rich in fruits, vegetables, and lean proteins, and low in salt and simple carbohydrates) and exercise (at least 30 minutes of moderate physical activity daily).  The above assessment and management plan was discussed with the patient. The patient verbalized understanding of and has agreed to the management plan. Patient is aware to call the clinic if they develop any new symptoms or if symptoms persist or worsen. Patient is aware when to return to the clinic for a follow-up visit. Patient educated on when it is appropriate to go to the emergency department.   Kari Baars, FNP-C [facility] [phone]

## 2024-02-15 ENCOUNTER — Other Ambulatory Visit: Payer: Self-pay

## 2024-02-15 ENCOUNTER — Emergency Department (HOSPITAL_COMMUNITY)

## 2024-02-15 ENCOUNTER — Inpatient Hospital Stay (HOSPITAL_COMMUNITY)
Admission: EM | Admit: 2024-02-15 | Discharge: 2024-02-17 | DRG: 690 | Disposition: A | Discharge status: Home Health | Attending: Internal Medicine | Admitting: Internal Medicine

## 2024-02-15 DIAGNOSIS — E872 Acidosis, unspecified: Secondary | ICD-10-CM | POA: Diagnosis not present

## 2024-02-15 DIAGNOSIS — N12 Tubulo-interstitial nephritis, not specified as acute or chronic: Secondary | ICD-10-CM | POA: Diagnosis not present

## 2024-02-15 DIAGNOSIS — Z955 Presence of coronary angioplasty implant and graft: Secondary | ICD-10-CM | POA: Diagnosis not present

## 2024-02-15 DIAGNOSIS — Z803 Family history of malignant neoplasm of breast: Secondary | ICD-10-CM

## 2024-02-15 DIAGNOSIS — E1169 Type 2 diabetes mellitus with other specified complication: Secondary | ICD-10-CM | POA: Diagnosis present

## 2024-02-15 DIAGNOSIS — R109 Unspecified abdominal pain: Secondary | ICD-10-CM | POA: Diagnosis not present

## 2024-02-15 DIAGNOSIS — Z8249 Family history of ischemic heart disease and other diseases of the circulatory system: Secondary | ICD-10-CM

## 2024-02-15 DIAGNOSIS — E785 Hyperlipidemia, unspecified: Secondary | ICD-10-CM | POA: Diagnosis not present

## 2024-02-15 DIAGNOSIS — B9620 Unspecified Escherichia coli [E. coli] as the cause of diseases classified elsewhere: Secondary | ICD-10-CM | POA: Diagnosis present

## 2024-02-15 DIAGNOSIS — A419 Sepsis, unspecified organism: Secondary | ICD-10-CM | POA: Diagnosis not present

## 2024-02-15 DIAGNOSIS — Z885 Allergy status to narcotic agent status: Secondary | ICD-10-CM

## 2024-02-15 DIAGNOSIS — N281 Cyst of kidney, acquired: Secondary | ICD-10-CM | POA: Diagnosis not present

## 2024-02-15 DIAGNOSIS — E1122 Type 2 diabetes mellitus with diabetic chronic kidney disease: Secondary | ICD-10-CM | POA: Diagnosis not present

## 2024-02-15 DIAGNOSIS — Z1152 Encounter for screening for COVID-19: Secondary | ICD-10-CM

## 2024-02-15 DIAGNOSIS — N179 Acute kidney failure, unspecified: Secondary | ICD-10-CM | POA: Diagnosis not present

## 2024-02-15 DIAGNOSIS — Z888 Allergy status to other drugs, medicaments and biological substances status: Secondary | ICD-10-CM

## 2024-02-15 DIAGNOSIS — I129 Hypertensive chronic kidney disease with stage 1 through stage 4 chronic kidney disease, or unspecified chronic kidney disease: Secondary | ICD-10-CM | POA: Diagnosis present

## 2024-02-15 DIAGNOSIS — Z6841 Body Mass Index (BMI) 40.0 and over, adult: Secondary | ICD-10-CM

## 2024-02-15 DIAGNOSIS — E871 Hypo-osmolality and hyponatremia: Secondary | ICD-10-CM | POA: Diagnosis not present

## 2024-02-15 DIAGNOSIS — E1140 Type 2 diabetes mellitus with diabetic neuropathy, unspecified: Secondary | ICD-10-CM | POA: Diagnosis present

## 2024-02-15 DIAGNOSIS — Z794 Long term (current) use of insulin: Secondary | ICD-10-CM | POA: Diagnosis not present

## 2024-02-15 DIAGNOSIS — G4733 Obstructive sleep apnea (adult) (pediatric): Secondary | ICD-10-CM | POA: Diagnosis present

## 2024-02-15 DIAGNOSIS — Z83438 Family history of other disorder of lipoprotein metabolism and other lipidemia: Secondary | ICD-10-CM

## 2024-02-15 DIAGNOSIS — J449 Chronic obstructive pulmonary disease, unspecified: Secondary | ICD-10-CM | POA: Diagnosis not present

## 2024-02-15 DIAGNOSIS — N1832 Chronic kidney disease, stage 3b: Secondary | ICD-10-CM | POA: Diagnosis not present

## 2024-02-15 DIAGNOSIS — D696 Thrombocytopenia, unspecified: Secondary | ICD-10-CM | POA: Diagnosis present

## 2024-02-15 DIAGNOSIS — E876 Hypokalemia: Secondary | ICD-10-CM | POA: Diagnosis present

## 2024-02-15 DIAGNOSIS — F17210 Nicotine dependence, cigarettes, uncomplicated: Secondary | ICD-10-CM | POA: Diagnosis not present

## 2024-02-15 DIAGNOSIS — E860 Dehydration: Secondary | ICD-10-CM | POA: Diagnosis not present

## 2024-02-15 DIAGNOSIS — I2510 Atherosclerotic heart disease of native coronary artery without angina pectoris: Secondary | ICD-10-CM | POA: Diagnosis not present

## 2024-02-15 DIAGNOSIS — N3289 Other specified disorders of bladder: Secondary | ICD-10-CM | POA: Diagnosis not present

## 2024-02-15 DIAGNOSIS — E119 Type 2 diabetes mellitus without complications: Secondary | ICD-10-CM

## 2024-02-15 DIAGNOSIS — R6520 Severe sepsis without septic shock: Secondary | ICD-10-CM | POA: Diagnosis not present

## 2024-02-15 DIAGNOSIS — K439 Ventral hernia without obstruction or gangrene: Secondary | ICD-10-CM | POA: Diagnosis not present

## 2024-02-15 DIAGNOSIS — Z8261 Family history of arthritis: Secondary | ICD-10-CM

## 2024-02-15 DIAGNOSIS — E1159 Type 2 diabetes mellitus with other circulatory complications: Secondary | ICD-10-CM | POA: Diagnosis present

## 2024-02-15 DIAGNOSIS — Z825 Family history of asthma and other chronic lower respiratory diseases: Secondary | ICD-10-CM

## 2024-02-15 DIAGNOSIS — I10 Essential (primary) hypertension: Secondary | ICD-10-CM | POA: Diagnosis not present

## 2024-02-15 DIAGNOSIS — Z90710 Acquired absence of both cervix and uterus: Secondary | ICD-10-CM

## 2024-02-15 DIAGNOSIS — Z720 Tobacco use: Secondary | ICD-10-CM | POA: Diagnosis present

## 2024-02-15 DIAGNOSIS — Z7982 Long term (current) use of aspirin: Secondary | ICD-10-CM

## 2024-02-15 DIAGNOSIS — Z79899 Other long term (current) drug therapy: Secondary | ICD-10-CM

## 2024-02-15 DIAGNOSIS — K219 Gastro-esophageal reflux disease without esophagitis: Secondary | ICD-10-CM | POA: Diagnosis not present

## 2024-02-15 DIAGNOSIS — N10 Acute tubulo-interstitial nephritis: Principal | ICD-10-CM | POA: Diagnosis present

## 2024-02-15 DIAGNOSIS — Z833 Family history of diabetes mellitus: Secondary | ICD-10-CM

## 2024-02-15 DIAGNOSIS — I152 Hypertension secondary to endocrine disorders: Secondary | ICD-10-CM | POA: Diagnosis present

## 2024-02-15 LAB — COMPREHENSIVE METABOLIC PANEL WITH GFR
ALT: 15 U/L (ref 0–44)
AST: 25 U/L (ref 15–41)
Albumin: 3.3 g/dL — ABNORMAL LOW (ref 3.5–5.0)
Alkaline Phosphatase: 134 U/L — ABNORMAL HIGH (ref 38–126)
Anion gap: 15 (ref 5–15)
BUN: 25 mg/dL — ABNORMAL HIGH (ref 8–23)
CO2: 23 mmol/L (ref 22–32)
Calcium: 8.6 mg/dL — ABNORMAL LOW (ref 8.9–10.3)
Chloride: 95 mmol/L — ABNORMAL LOW (ref 98–111)
Creatinine, Ser: 2.28 mg/dL — ABNORMAL HIGH (ref 0.44–1.00)
GFR, Estimated: 23 mL/min — ABNORMAL LOW (ref >60)
Glucose, Bld: 100 mg/dL — ABNORMAL HIGH (ref 70–99)
Potassium: 3 mmol/L — ABNORMAL LOW (ref 3.5–5.1)
Sodium: 133 mmol/L — ABNORMAL LOW (ref 135–145)
Total Bilirubin: 1.4 mg/dL — ABNORMAL HIGH (ref 0.0–1.2)
Total Protein: 7 g/dL (ref 6.5–8.1)

## 2024-02-15 LAB — CBC WITH DIFFERENTIAL/PLATELET
Abs Immature Granulocytes: 0.02 10*3/uL (ref 0.00–0.07)
Basophils Absolute: 0.1 10*3/uL (ref 0.0–0.2)
Basophils Absolute: 0.1 10*3/uL (ref 0.0–0.1)
Basophils Relative: 1 %
Basos: 1 %
EOS (ABSOLUTE): 0.1 10*3/uL (ref 0.0–0.4)
Eos: 1 %
Eosinophils Absolute: 0.1 10*3/uL (ref 0.0–0.5)
Eosinophils Relative: 2 %
HCT: 37.4 % (ref 36.0–46.0)
Hematocrit: 38.1 % (ref 34.0–46.6)
Hemoglobin: 12.9 g/dL (ref 11.1–15.9)
Hemoglobin: 12.1 g/dL (ref 12.0–15.0)
Immature Grans (Abs): 0 10*3/uL (ref 0.0–0.1)
Immature Granulocytes: 0 %
Immature Granulocytes: 0 %
Lymphocytes Absolute: 2.2 10*3/uL (ref 0.7–3.1)
Lymphocytes Relative: 26 %
Lymphs Abs: 1.8 10*3/uL (ref 0.7–4.0)
Lymphs: 28 %
MCH: 31.9 pg (ref 26.6–33.0)
MCH: 30.6 pg (ref 26.0–34.0)
MCHC: 33.9 g/dL (ref 31.5–35.7)
MCHC: 32.4 g/dL (ref 30.0–36.0)
MCV: 94 fL (ref 79–97)
MCV: 94.4 fL (ref 80.0–100.0)
Monocytes Absolute: 0.8 10*3/uL (ref 0.1–0.9)
Monocytes Absolute: 0.8 10*3/uL (ref 0.1–1.0)
Monocytes Relative: 11 %
Monocytes: 10 %
Neutro Abs: 4.2 10*3/uL (ref 1.7–7.7)
Neutrophils Absolute: 4.9 10*3/uL (ref 1.4–7.0)
Neutrophils Relative %: 60 %
Neutrophils: 60 %
Platelets: 75 10*3/uL — CL (ref 150–450)
Platelets: 111 10*3/uL — ABNORMAL LOW (ref 150–400)
RBC: 4.05 x10E6/uL (ref 3.77–5.28)
RBC: 3.96 MIL/uL (ref 3.87–5.11)
RDW: 14.9 % (ref 11.7–15.4)
RDW: 16.2 % — ABNORMAL HIGH (ref 11.5–15.5)
WBC: 8.1 10*3/uL (ref 3.4–10.8)
WBC: 7 10*3/uL (ref 4.0–10.5)
nRBC: 0 % (ref 0.0–0.2)

## 2024-02-15 LAB — URINALYSIS, ROUTINE W REFLEX MICROSCOPIC
Bilirubin Urine: NEGATIVE
Glucose, UA: NEGATIVE mg/dL
Ketones, ur: NEGATIVE mg/dL
Nitrite: POSITIVE — AB
Protein, ur: 30 mg/dL — AB
Specific Gravity, Urine: 1.006 (ref 1.005–1.030)
WBC, UA: >50 WBC/hpf (ref 0–5)
pH: 6 (ref 5.0–8.0)

## 2024-02-15 LAB — CMP14+EGFR
ALT: 12 IU/L (ref 0–32)
AST: 15 IU/L (ref 0–40)
Albumin: 3.9 g/dL (ref 3.9–4.9)
Alkaline Phosphatase: 145 IU/L — ABNORMAL HIGH (ref 44–121)
BUN/Creatinine Ratio: 10 — ABNORMAL LOW (ref 12–28)
BUN: 22 mg/dL (ref 8–27)
Bilirubin Total: 1.3 mg/dL — ABNORMAL HIGH (ref 0.0–1.2)
CO2: 21 mmol/L (ref 20–29)
Calcium: 8.9 mg/dL (ref 8.7–10.3)
Chloride: 98 mmol/L (ref 96–106)
Creatinine, Ser: 2.15 mg/dL — ABNORMAL HIGH (ref 0.57–1.00)
Globulin, Total: 2.7 g/dL (ref 1.5–4.5)
Glucose: 91 mg/dL (ref 70–99)
Potassium: 3.4 mmol/L — ABNORMAL LOW (ref 3.5–5.2)
Sodium: 138 mmol/L (ref 134–144)
Total Protein: 6.6 g/dL (ref 6.0–8.5)
eGFR: 25 mL/min/{1.73_m2} — ABNORMAL LOW (ref >59)

## 2024-02-15 LAB — CBG MONITORING, ED: Glucose-Capillary: 82 mg/dL (ref 70–99)

## 2024-02-15 LAB — LACTIC ACID, PLASMA
Lactic Acid, Venous: 2.1 mmol/L (ref 0.5–1.9)
Lactic Acid, Venous: 1 mmol/L (ref 0.5–1.9)

## 2024-02-15 LAB — HEMOGLOBIN A1C
Hgb A1c MFr Bld: 6.5 % — ABNORMAL HIGH (ref 4.8–5.6)
Mean Plasma Glucose: 139.85 mg/dL

## 2024-02-15 LAB — RESP PANEL BY RT-PCR (RSV, FLU A&B, COVID)  RVPGX2
Influenza A by PCR: NEGATIVE
Influenza B by PCR: NEGATIVE
Resp Syncytial Virus by PCR: NEGATIVE
SARS Coronavirus 2 by RT PCR: NEGATIVE

## 2024-02-15 LAB — MAGNESIUM: Magnesium: 2 mg/dL (ref 1.7–2.4)

## 2024-02-15 LAB — GLUCOSE, CAPILLARY: Glucose-Capillary: 180 mg/dL — ABNORMAL HIGH (ref 70–99)

## 2024-02-15 LAB — PHOSPHORUS: Phosphorus: 2.9 mg/dL (ref 2.5–4.6)

## 2024-02-15 MED ORDER — INSULIN GLARGINE-YFGN 100 UNIT/ML ~~LOC~~ SOLN
35.0000 [IU] | Freq: Every day | SUBCUTANEOUS | Status: DC
  Filled 2024-02-15: qty 0.35

## 2024-02-15 MED ORDER — POTASSIUM CHLORIDE CRYS ER 20 MEQ PO TBCR
40.0000 meq | EXTENDED_RELEASE_TABLET | Freq: Four times a day (QID) | ORAL | Status: AC
  Administered 2024-02-15 (×2): 40 meq via ORAL
  Filled 2024-02-15 (×2): qty 2

## 2024-02-15 MED ORDER — GABAPENTIN 300 MG PO CAPS
900.0000 mg | ORAL_CAPSULE | Freq: Three times a day (TID) | ORAL | Status: DC
  Administered 2024-02-15 – 2024-02-17 (×6): 900 mg via ORAL
  Filled 2024-02-15 (×6): qty 3

## 2024-02-15 MED ORDER — LACTATED RINGERS IV BOLUS
1000.0000 mL | Freq: Once | INTRAVENOUS | Status: AC
  Administered 2024-02-15: 1000 mL via INTRAVENOUS

## 2024-02-15 MED ORDER — SODIUM CHLORIDE 0.9 % IV SOLN: 1.0000 g | INTRAVENOUS | Status: DC

## 2024-02-15 MED ORDER — ROSUVASTATIN CALCIUM 20 MG PO TABS
20.0000 mg | ORAL_TABLET | Freq: Every day | ORAL | Status: DC
  Administered 2024-02-15 – 2024-02-16 (×2): 20 mg via ORAL
  Filled 2024-02-15 (×2): qty 1

## 2024-02-15 MED ORDER — PANTOPRAZOLE SODIUM 40 MG PO TBEC
40.0000 mg | DELAYED_RELEASE_TABLET | Freq: Every day | ORAL | Status: DC
  Administered 2024-02-15 – 2024-02-17 (×3): 40 mg via ORAL
  Filled 2024-02-15 (×3): qty 1

## 2024-02-15 MED ORDER — SODIUM CHLORIDE 0.9 % IV BOLUS
1000.0000 mL | Freq: Once | INTRAVENOUS | Status: AC
  Administered 2024-02-15: 1000 mL via INTRAVENOUS

## 2024-02-15 MED ORDER — ACETAMINOPHEN 325 MG PO TABS: 325.0000 mg | ORAL_TABLET | Freq: Four times a day (QID) | ORAL | Status: DC | PRN

## 2024-02-15 MED ORDER — INSULIN GLARGINE-YFGN 100 UNIT/ML ~~LOC~~ SOLN
25.0000 [IU] | Freq: Every day | SUBCUTANEOUS | Status: DC
  Administered 2024-02-15 – 2024-02-16 (×2): 25 [IU] via SUBCUTANEOUS
  Filled 2024-02-15 (×3): qty 0.25

## 2024-02-15 MED ORDER — ASPIRIN 325 MG PO TBEC
325.0000 mg | DELAYED_RELEASE_TABLET | Freq: Every day | ORAL | Status: DC
Start: 2024-02-16 — End: 2024-02-17
  Administered 2024-02-16 – 2024-02-17 (×2): 325 mg via ORAL
  Filled 2024-02-15 (×2): qty 1

## 2024-02-15 MED ORDER — HYDRALAZINE HCL 20 MG/ML IJ SOLN: 5.0000 mg | INTRAMUSCULAR | Status: DC | PRN

## 2024-02-15 MED ORDER — POTASSIUM CHLORIDE 10 MEQ/100ML IV SOLN
10.0000 meq | INTRAVENOUS | Status: AC
  Administered 2024-02-15 (×2): 10 meq via INTRAVENOUS
  Filled 2024-02-15 (×2): qty 100

## 2024-02-15 MED ORDER — SODIUM CHLORIDE 0.9 % IV SOLN
1.0000 g | Freq: Once | INTRAVENOUS | Status: AC
  Administered 2024-02-15: 1 g via INTRAVENOUS
  Filled 2024-02-15: qty 10

## 2024-02-15 MED ORDER — INSULIN ASPART 100 UNIT/ML IJ SOLN: 0.0000 [IU] | Freq: Every day | INTRAMUSCULAR | Status: DC

## 2024-02-15 MED ORDER — NITROGLYCERIN 0.4 MG SL SUBL: 0.4000 mg | SUBLINGUAL_TABLET | SUBLINGUAL | Status: DC | PRN

## 2024-02-15 MED ORDER — ALBUTEROL SULFATE (2.5 MG/3ML) 0.083% IN NEBU: 2.5000 mg | INHALATION_SOLUTION | RESPIRATORY_TRACT | Status: DC | PRN

## 2024-02-15 MED ORDER — NICOTINE 21 MG/24HR TD PT24
21.0000 mg | MEDICATED_PATCH | Freq: Every day | TRANSDERMAL | Status: DC
  Administered 2024-02-15 – 2024-02-17 (×3): 21 mg via TRANSDERMAL
  Filled 2024-02-15 (×3): qty 1

## 2024-02-15 MED ORDER — LACTATED RINGERS IV SOLN: INTRAVENOUS | Status: AC

## 2024-02-15 MED ORDER — INSULIN ASPART 100 UNIT/ML IJ SOLN
0.0000 [IU] | Freq: Three times a day (TID) | INTRAMUSCULAR | Status: DC
  Administered 2024-02-17: 2 [IU] via SUBCUTANEOUS

## 2024-02-15 MED ORDER — HYDROCODONE-ACETAMINOPHEN 5-325 MG PO TABS
1.0000 | ORAL_TABLET | ORAL | Status: DC | PRN
  Administered 2024-02-15 – 2024-02-16 (×5): 1 via ORAL
  Filled 2024-02-15 (×5): qty 1

## 2024-02-15 MED ORDER — OXYBUTYNIN CHLORIDE ER 5 MG PO TB24
5.0000 mg | ORAL_TABLET | Freq: Every day | ORAL | Status: DC
Start: 2024-02-15 — End: 2024-02-17
  Administered 2024-02-15 – 2024-02-16 (×2): 5 mg via ORAL
  Filled 2024-02-15 (×2): qty 1

## 2024-02-15 MED ORDER — ACETAMINOPHEN 650 MG RE SUPP: 325.0000 mg | Freq: Four times a day (QID) | RECTAL | Status: DC | PRN

## 2024-02-15 MED ORDER — HEPARIN SODIUM (PORCINE) 5000 UNIT/ML IJ SOLN
5000.0000 [IU] | Freq: Three times a day (TID) | INTRAMUSCULAR | Status: DC
  Administered 2024-02-16: 5000 [IU] via SUBCUTANEOUS
  Filled 2024-02-15: qty 1

## 2024-02-15 NOTE — ED Notes (Signed)
 Date and time results received: 02/15/24 1335   Test: LACTIC Critical Value: 2.1  Name of Provider Notified: Eber Hong MD

## 2024-02-15 NOTE — ED Notes (Signed)
Pt returned from CT Scan 

## 2024-02-15 NOTE — Progress Notes (Signed)
   02/15/24 1908  TOC Brief Assessment  Insurance and Status Reviewed  Patient has primary care physician Yes  Home environment has been reviewed From home  Prior level of function: Independent  Prior/Current Home Services No current home services  Social Drivers of Health Review SDOH reviewed interventions complete (Smoking cessation added to AVS)  Readmission risk has been reviewed Yes  Transition of care needs no transition of care needs at this time   Transition of Care Department [facility]) has reviewed patient and no other TOC needs have been identified at this time. We will continue to monitor patient advancement through interdisciplinary progression rounds. If new patient needs arise, please place a TOC consult.

## 2024-02-15 NOTE — ED Triage Notes (Signed)
 Pt arrived and stated Dr sent her here because her kidney were bad and infected and needed to be admitted. Pt c/o burning during urination and some frequency. Pt also c/o shooting pain in her vagina x 1 month.

## 2024-02-15 NOTE — ED Provider Notes (Signed)
 [place] EMERGENCY DEPARTMENT AT [facility] Provider Note   CSN: [id number] Arrival date & time: 02/15/24  1111     History  Chief Complaint  Patient presents with   Urinary Frequency    [name] is a 63 y.o. female.  Patient is a 63 year old female who presents to the emergency department with a chief complaint of ongoing dysuria and abdominal pain.  Patient notes that she has been on multiple antibiotics for urinary tract infection and symptoms of not resolved.  Patient currently denies any associated chest pain, shortness of breath.  She does admit to associated fevers at home.  She has had no associated dizziness, lightheadedness or syncope.  She was most recently started on ciprofloxacin.  She does admit to generalized weakness.   Urinary Frequency Associated symptoms include abdominal pain.       Home Medications Prior to Admission medications   Medication Sig Start Date End Date Taking? Authorizing Provider  acetaminophen (TYLENOL) 325 MG tablet Take 975 mg by mouth every 8 (eight) hours as needed.    [provider]  albuterol (VENTOLIN HFA) 108 (90 Base) MCG/ACT inhaler INHALE 2 PUFFS BY MOUTH EVERY 4 HOURS AS NEEDED FOR WHEEZING OR SHORTNESS OF BREATH (FOR RESCUE) 12/27/23   Delynn Flavin M, DO  Alpha-Lipoic Acid 600 MG CAPS Take 1 capsule (600 mg total) by mouth daily. For diabetic neuropathy 02/13/21   [name], DO  aspirin EC 325 MG tablet Take 325 mg by mouth daily.    [provider]  Blood Glucose Monitoring Suppl (ONETOUCH VERIO) w/Device KIT Use to test blood sugar twice daily as directed; DX: E11.69 06/28/23   Delynn Flavin M, DO  ciprofloxacin (CIPRO) 500 MG tablet Take 1 tablet (500 mg total) by mouth 2 (two) times daily for 7 days. 02/14/24 02/21/24  Sonny Masters, FNP  Continuous Glucose Sensor (FREESTYLE LIBRE 3 PLUS SENSOR) MISC Change sensor every 15 days. DX: E11.65 09/23/23   Delynn Flavin M, DO   furosemide (LASIX) 40 MG tablet TAKE 1 TABLET BY MOUTH ONCE DAILY AS NEEDED FOR EDEMA OR FLUID 03/21/23   Delynn Flavin M, DO  gabapentin (NEURONTIN) 600 MG tablet TAKE 1 & 1/2 (ONE & ONE-HALF) TABLETS BY MOUTH THREE TIMES DAILY 01/18/24   Delynn Flavin M, DO  glucose blood (ONETOUCH VERIO) test strip Use to test blood sugar twice daily as directed; DX: E11.69 06/28/23   Delynn Flavin M, DO  Insulin Glargine (BASAGLAR KWIKPEN) 100 UNIT/ML Inject 45-50 Units into the skin at bedtime. May increase up to 50 units if instructed by your provider. 10/18/23   [name], DO  Insulin Pen Needle 29G X MISC Use to inject insulin daily as directed. DX:E11.65; please profile for future needs 10/20/23   Delynn Flavin M, DO  methocarbamol (ROBAXIN) 500 MG tablet Take 1 tablet (500 mg total) by mouth every 6 (six) hours as needed for muscle spasms. 03/21/23   [name], DO  metoprolol tartrate (LOPRESSOR) 25 MG tablet Take 1 tablet (25 mg total) by mouth 2 (two) times daily. 05/03/23   Sharlene Dory, NP  nitroGLYCERIN (NITROSTAT) 0.4 MG SL tablet Place 1 tablet (0.4 mg total) under the tongue every 5 (five) minutes x 3 doses as needed for chest pain (if no relief after 3rd dose, proceed to ED or call 911). 03/21/23   Sharlene Dory, NP  omeprazole (PRILOSEC) 40 MG capsule Take 1 capsule (40 mg total) by mouth daily.  For heartburn 03/21/23   Delynn Flavin M, DO  OneTouch Delica Lancets 30G MISC Use to test blood sugar twice daily as directed; DX: E11.69 06/28/23   Delynn Flavin M, DO  oxybutynin (DITROPAN XL) 5 MG 24 hr tablet Take 1 tablet (5 mg total) by mouth at bedtime. For bladder 05/24/23   Delynn Flavin M, DO  rosuvastatin (CRESTOR) 20 MG tablet Take 1 tablet (20 mg total) by mouth at bedtime. 03/21/23   [name], DO  Semaglutide 14 MG TABS Take 14 mg by mouth daily.    [provider]  sertraline (ZOLOFT) 50 MG tablet Take 1 tablet (50 mg total)  by mouth daily. Daily for depression/ anxiety 03/21/23   Delynn Flavin M, DO  telmisartan (MICARDIS) 40 MG tablet Take 1 tablet (40 mg total) by mouth daily. 03/21/23   [name], DO  Vitamin D, Ergocalciferol, (DRISDOL) 1.25 MG (50000 UNIT) CAPS capsule TAKE 1 CAPSULE BY MOUTH ONCE A WEEK, THEN TAKE OTC VITAMIN D 800IU DAILY 04/28/23   Delynn Flavin M, DO      Allergies    Duloxetine, Farxiga [dapagliflozin], Morphine, and Trulicity [dulaglutide]    Review of Systems   Review of Systems  Gastrointestinal:  Positive for abdominal pain.  Genitourinary:  Positive for dysuria and frequency.    Physical Exam Updated Vital Signs BP (!) 102/57   Pulse 70   Temp 98.8 F (37.1 C) (Oral)   Resp 12   Ht 5\' 2"  (1.575 m)   Wt 133.8 kg   SpO2 95%   BMI 53.96 kg/m  Physical Exam Vitals and nursing note reviewed.  Constitutional:      Appearance: Normal appearance.  HENT:     Head: Normocephalic and atraumatic.     Nose: Nose normal.     Mouth/Throat:     Mouth: Mucous membranes are moist.  Eyes:     Extraocular Movements: Extraocular movements intact.     Conjunctiva/sclera: Conjunctivae normal.     Pupils: Pupils are equal, round, and reactive to light.  Cardiovascular:     Rate and Rhythm: Normal rate and regular rhythm.     Pulses: Normal pulses.     Heart sounds: Normal heart sounds. No murmur heard.    No gallop.  Pulmonary:     Effort: Pulmonary effort is normal. No respiratory distress.     Breath sounds: Normal breath sounds. No stridor. No wheezing, rhonchi or rales.  Abdominal:     General: Abdomen is flat. Bowel sounds are normal. There is no distension.     Palpations: Abdomen is soft.     Tenderness: There is no guarding.     Comments: Diffuse tenderness  Musculoskeletal:        General: Normal range of motion.     Cervical back: Normal range of motion and neck supple.     Right lower leg: No edema.     Left lower leg: No edema.  Skin:     General: Skin is warm and dry.     Findings: No rash.  Neurological:     General: No focal deficit present.     Mental Status: She is alert and oriented to person, place, and time. Mental status is at baseline.  Psychiatric:        Mood and Affect: Mood normal.        Behavior: Behavior normal.        Thought Content: Thought content normal.  Judgment: Judgment normal.     ED Results / Procedures / Treatments   Labs (all labs ordered are listed, but only abnormal results are displayed) Labs Reviewed  CULTURE, BLOOD (ROUTINE X 2)  CULTURE, BLOOD (ROUTINE X 2)  URINALYSIS, ROUTINE W REFLEX MICROSCOPIC  COMPREHENSIVE METABOLIC PANEL WITH GFR  CBC WITH DIFFERENTIAL/PLATELET  LACTIC ACID, PLASMA  LACTIC ACID, PLASMA    EKG EKG Interpretation Date/Time:  Wednesday February 15 2024 12:26:06 EDT Ventricular Rate:  68 PR Interval:  176 QRS Duration:  155 QT Interval:  471 QTC Calculation: 501 R Axis:   177  Text Interpretation: Sinus rhythm RBBB and LPFB Confirmed by Eber Hong ([id number]) on 02/15/2024 12:45:27 PM  Radiology No results found.  Procedures .Critical Care  Performed by: Lelon Perla, PA-C Authorized by: Lelon Perla, PA-C   Critical care provider statement:    Critical care time (minutes):  35   Critical care was necessary to treat or prevent imminent or life-threatening deterioration of the following conditions:  Sepsis   Critical care was time spent personally by me on the following activities:  Development of treatment plan with patient or surrogate, discussions with consultants, evaluation of patient's response to treatment, examination of patient, ordering and review of laboratory studies, ordering and review of radiographic studies, ordering and performing treatments and interventions, pulse oximetry, re-evaluation of patient's condition and review of old charts   I assumed direction of critical care for this patient from another  provider in my specialty: no     Care discussed with: admitting provider       Medications Ordered in ED Medications  lactated ringers bolus 1,000 mL (has no administration in time range)  cefTRIAXone (ROCEPHIN) 1 g in sodium chloride 0.9 % 100 mL IVPB (1 g Intravenous New Bag/Given 02/15/24 1228)    ED Course/ Medical Decision Making/ A&P                                 Medical Decision Making Amount and/or Complexity of Data Reviewed Labs: ordered. Radiology: ordered.  Risk Prescription drug management. Decision regarding hospitalization.   This patient presents to the ED for concern of dysuria, abdominal pain, this involves an extensive number of treatment options, and is a complaint that carries with it a high risk of complications and morbidity.  The differential diagnosis includes pyelonephritis, kidney stone, cystitis, sepsis, diverticulitis, appendicitis, cholecystitis   Co morbidities that complicate the patient evaluation  Diabetes   Additional history obtained:  Additional history obtained from medical records External records from outside source obtained and reviewed including medical records   Lab Tests:  I Ordered, and personally interpreted labs.  The pertinent results include: No leukocytosis or anemia, hypokalemia, elevated creatinine, low sodium, low chloride, negative viral swab, urinalysis with leukocytes and nitrite positive   Imaging Studies ordered:  I ordered imaging studies including CT scan of the abdomen pelvis I independently visualized and interpreted imaging which showed stranding of the left kidney I agree with the radiologist interpretation   Cardiac Monitoring: / EKG:  The patient was maintained on a cardiac monitor.  I personally viewed and interpreted the cardiac monitored which showed an underlying rhythm of: Normal sinus rhythm, right bundle branch block, no ST/T wave changes, no ischemic changes, no STEMI   Consultations  Obtained:  I requested consultation with the hospitalist,  and discussed lab and imaging findings as well as pertinent plan -  they recommend: Admission   Problem List / ED Course / Critical interventions / Medication management  Patient does remain stable at this time.  Discussed with patient we will plan for admission to the hospital service given her ongoing urinary tract infection and apparent pyelonephritis.  Patient has been treated for sepsis at this time.  Have utilized judicious fluid to avoid fluid overload.  Patient vital signs have improved at this time.  She has been started on Rocephin.  Potassium has been repleted.  CT scan of abdomen pelvis was consistent with pyelonephritis with no signs of ureteral stone.  Have discussed patient case with Dr. Randol Kern with the hospitalist service who has excepted at this time. I ordered medication including IV fluids, Rocephin, potassium for sepsis, pyelonephritis, hypokalemia Reevaluation of the patient after these medicines showed that the patient improved I have reviewed the patients home medicines and have made adjustments as needed   Social Determinants of Health:  None   Test / Admission - Considered:  Admission        Final Clinical Impression(s) / ED Diagnoses Final diagnoses:  None    Rx / DC Orders ED Discharge Orders     None         Kathlen Mody 02/15/24 1543    Eber Hong, MD 02/16/24 [phone]

## 2024-02-15 NOTE — H&P (Signed)
 [facility] H&P   Patient Demographics:    [name], is a 63 y.o. female  MRN: [id number]   DOB - [birth year]  Admit Date - 02/15/2024  Outpatient Primary MD for the patient is [name], DO  Referring MD/NP/PA: PA Rigney  Patient coming from: home  Chief Complaint  Patient presents with   Urinary Frequency      HPI:    [name]  is a 63 y.o. female, with past medical history of CAD, tobacco abuse, hypertension, hyperlipidemia, diabetes mellitus, patient diagnosed by UTI by PCP, urine culture growing E. coli, pansensitive, she was treated with p.o. antibiotic as an outpatient, she kept having symptoms, seen by PCP again yesterday where she was ordered p.o. Cipro but she has not started yet, she kept having symptoms including dysuria, polyuria, abdominal pain and discomfort which prompted her to come to ED, she reports generalized weakness, poor fluid and food intake. - ED CT abdomen pelvis significant for left-sided pyelonephritis, UA significant for bacteriuria and pyuria, blood pressure soft initially responded to IV fluids, lactic acid mildly elevated but resolved with IV fluid, her creatinine up to 2.5, potassium low at 3, [facility]ist consulted to admit.    Review of systems:    A full 10 point Review of Systems was done, except as stated above, all other Review of Systems were negative.   With Past History of the following :    Past Medical History:  Diagnosis Date   Arthritis    CAD (coronary artery disease)    BMS to circumflex 2007 - Dr. Juanda Chance   CKD (chronic kidney disease) stage 3, GFR 30-59 ml/min (HCC)    COPD (chronic obstructive pulmonary disease) (HCC)    Depression    Dyspnea    Essential hypertension    Falls frequently    GERD (gastroesophageal reflux disease)    HA (headache)    Hyperlipidemia    Hyperlipidemia associated with type 2  diabetes mellitus (HCC) 02/03/2011   Left-sided face pain    Morbid obesity (HCC)    Noncompliance    OSA (obstructive sleep apnea)    Uses CPAP   Type 2 diabetes mellitus (HCC)    Urinary incontinence    Urinary incontinence       Past Surgical History:  Procedure Laterality Date   ABDOMINAL HERNIA REPAIR     ABDOMINAL HYSTERECTOMY     BACK SURGERY     BREAST SURGERY     biopsy per right breast; pt states has silver piece in for marking    CARPAL TUNNEL RELEASE Left 06/13/2023   Procedure: LEFT CARPAL TUNNEL RELEASE;  Surgeon: Oliver Barre, MD;  Location: [facility];  Service: Orthopedics;  Laterality: Left;   Cataract surgery Bilateral    CHOLECYSTECTOMY     COLONOSCOPY N/A 04/10/2013   Procedure: COLONOSCOPY;  Surgeon: Malissa Hippo, MD;  Location:  [facility];  Service: Endoscopy;  Laterality: N/A;   CORONARY ANGIOPLASTY WITH STENT PLACEMENT  2012   HERNIA REPAIR     LEFT HEART CATH AND CORONARY ANGIOGRAPHY N/A 06/19/2020   Procedure: LEFT HEART CATH AND CORONARY ANGIOGRAPHY;  Surgeon: Lyn Records, MD;  Location: [facility];  Service: Cardiovascular;  Laterality: N/A;   LUMBAR LAMINECTOMY/DECOMPRESSION MICRODISCECTOMY N/A 04/25/2015   Procedure: CENTRAL LUMBAR /DECOMPRESSION L4-L5;  Surgeon: Ranee Gosselin, MD;  Location: [facility];  Service: Orthopedics;  Laterality: N/A;   TUBAL LIGATION        Social History:     Social History   Tobacco Use   Smoking status: Every Day    Current packs/day: 1.00    Average packs/day: 1 pack/day for 51.8 years (51.8 ttl pk-yrs)    Types: Cigarettes    Start date: 05/14/1972    Passive exposure: Current   Smokeless tobacco: Never   Tobacco comments:    Smoking Cessation Classes, Services, Agencies & Resources Offered.  Substance Use Topics   Alcohol use: No    Alcohol/week: 0.0 standard drinks of alcohol       Family History :     Family History  Problem Relation Age of Onset   Breast cancer Mother    Cancer  Father    Coronary artery disease Father    Cancer - Colon Father    Diabetes Father    High Cholesterol Father    Arthritis Sister    Asthma Sister    High Cholesterol Daughter    Thyroid disease Daughter    Hiatal hernia Son    Congenital heart disease Son      Home Medications:   Prior to Admission medications   Medication Sig Start Date End Date Taking? Authorizing Provider  acetaminophen (TYLENOL) 325 MG tablet Take 975 mg by mouth every 8 (eight) hours as needed.    [provider]  albuterol (VENTOLIN HFA) 108 (90 Base) MCG/ACT inhaler INHALE 2 PUFFS BY MOUTH EVERY 4 HOURS AS NEEDED FOR WHEEZING OR SHORTNESS OF BREATH (FOR RESCUE) 12/27/23   Delynn Flavin M, DO  Alpha-Lipoic Acid 600 MG CAPS Take 1 capsule (600 mg total) by mouth daily. For diabetic neuropathy 02/13/21   [name], DO  aspirin EC 325 MG tablet Take 325 mg by mouth daily.    [provider]  Blood Glucose Monitoring Suppl (ONETOUCH VERIO) w/Device KIT Use to test blood sugar twice daily as directed; DX: E11.69 06/28/23   Delynn Flavin M, DO  ciprofloxacin (CIPRO) 500 MG tablet Take 1 tablet (500 mg total) by mouth 2 (two) times daily for 7 days. 02/14/24 02/21/24  Sonny Masters, FNP  Continuous Glucose Sensor (FREESTYLE LIBRE 3 PLUS SENSOR) MISC Change sensor every 15 days. DX: E11.65 09/23/23   Delynn Flavin M, DO  furosemide (LASIX) 40 MG tablet TAKE 1 TABLET BY MOUTH ONCE DAILY AS NEEDED FOR EDEMA OR FLUID 03/21/23   Delynn Flavin M, DO  gabapentin (NEURONTIN) 600 MG tablet TAKE 1 & 1/2 (ONE & ONE-HALF) TABLETS BY MOUTH THREE TIMES DAILY 01/18/24   Delynn Flavin M, DO  glucose blood (ONETOUCH VERIO) test strip Use to test blood sugar twice daily as directed; DX: E11.69 06/28/23   Delynn Flavin M, DO  Insulin Glargine (BASAGLAR KWIKPEN) 100 UNIT/ML Inject 45-50 Units into the skin at bedtime. May increase up to 50 units if instructed by your provider. 10/18/23    [name], DO  Insulin Pen Needle 29G X MISC Use to inject insulin daily as directed. DX:E11.65; please profile for future needs 10/20/23   Delynn Flavin M, DO  methocarbamol (ROBAXIN) 500 MG tablet Take 1 tablet (500 mg total) by mouth every 6 (six) hours as needed for muscle spasms. 03/21/23   [name], DO  metoprolol tartrate (LOPRESSOR) 25 MG tablet Take 1 tablet (25 mg total) by mouth 2 (two) times daily. 05/03/23   Sharlene Dory, NP  nitroGLYCERIN (NITROSTAT) 0.4 MG SL tablet Place 1 tablet (0.4 mg total) under the tongue every 5 (five) minutes x 3 doses as needed for chest pain (if no relief after 3rd dose, proceed to ED or call 911). 03/21/23   Sharlene Dory, NP  omeprazole (PRILOSEC) 40 MG capsule Take 1 capsule (40 mg total) by mouth daily. For heartburn 03/21/23   Delynn Flavin M, DO  OneTouch Delica Lancets 30G MISC Use to test blood sugar twice daily as directed; DX: E11.69 06/28/23   Delynn Flavin M, DO  oxybutynin (DITROPAN XL) 5 MG 24 hr tablet Take 1 tablet (5 mg total) by mouth at bedtime. For bladder 05/24/23   Delynn Flavin M, DO  rosuvastatin (CRESTOR) 20 MG tablet Take 1 tablet (20 mg total) by mouth at bedtime. 03/21/23   [name], DO  Semaglutide 14 MG TABS Take 14 mg by mouth daily.    [provider]  sertraline (ZOLOFT) 50 MG tablet Take 1 tablet (50 mg total) by mouth daily. Daily for depression/ anxiety 03/21/23   Delynn Flavin M, DO  telmisartan (MICARDIS) 40 MG tablet Take 1 tablet (40 mg total) by mouth daily. 03/21/23   [name], DO  Vitamin D, Ergocalciferol, (DRISDOL) 1.25 MG (50000 UNIT) CAPS capsule TAKE 1 CAPSULE BY MOUTH ONCE A WEEK, THEN TAKE OTC VITAMIN D 800IU DAILY 04/28/23   [name], DO     Allergies:     Allergies  Allergen Reactions   Duloxetine    Farxiga [Dapagliflozin]     RECURRENT YEAST INFECTIONS   Morphine Nausea And Vomiting   Trulicity [Dulaglutide]     GI  adverse effects (okay on Rybelsus)     Physical Exam:   Vitals  Blood pressure (!) 112/53, pulse 66, temperature 98.8 F (37.1 C), temperature source Oral, resp. rate 17, height 5\' 2"  (1.575 m), weight 133.8 kg, SpO2 95%.   1. General well developed femal lying in bed in NAD,  2. Normal affect and insight, Not Suicidal or Homicidal, Awake Alert, Oriented X 3.  3. No F.N deficits, ALL C.Nerves Intact, Strength 5/5 all 4 extremities, Sensation intact all 4 extremities, Plantars down going.  4. Ears and Eyes appear Normal, Conjunctivae clear, PERRLA. Moist Oral Mucosa.  5. Supple Neck, No JVD, No cervical lymphadenopathy appriciated, No Carotid Bruits.  6. Symmetrical Chest wall movement, Good air movement bilaterally, CTAB.  7. RRR, No Gallops, Rubs or Murmurs, No Parasternal Heave.  8. Positive Bowel Sounds, Abdomen Soft, left CVA  tenderness, No organomegaly appriciated,No rebound -guarding or rigidity.  9.  No Cyanosis, Normal Skin Turgor, No Skin Rash or Bruise.  10. Good muscle tone,  joints appear normal , no effusions, Normal ROM.     Data Review:    CBC Recent Labs  Lab 02/14/24 1522 02/15/24 1218  WBC 8.1 7.0  HGB 12.9 12.1  HCT 38.1 37.4  PLT 75* 111*  MCV 94 94.4  MCH 31.9 30.6  MCHC 33.9 32.4  RDW  14.9 16.2*  LYMPHSABS 2.2 1.8  MONOABS  --  0.8  EOSABS 0.1 0.1  BASOSABS 0.1 0.1   ------------------------------------------------------------------------------------------------------------------  Chemistries  Recent Labs  Lab 02/14/24 1522 02/15/24 1218  NA 138 133*  K 3.4* 3.0*  CL 98 95*  CO2 21 23  GLUCOSE 91 100*  BUN 22 25*  CREATININE 2.15* 2.28*  CALCIUM 8.9 8.6*  AST 15 25  ALT 12 15  ALKPHOS 145* 134*  BILITOT 1.3* 1.4*   ------------------------------------------------------------------------------------------------------------------ estimated creatinine clearance is 32.9 mL/min (A) (by C-G formula based on SCr of 2.28 mg/dL  (H)). ------------------------------------------------------------------------------------------------------------------ No results for input(s): "TSH", "T4TOTAL", "T3FREE", "THYROIDAB" in the last 72 hours.  Invalid input(s): "FREET3"  Coagulation profile No results for input(s): "INR", "PROTIME" in the last 168 hours. ------------------------------------------------------------------------------------------------------------------- No results for input(s): "DDIMER" in the last 72 hours. -------------------------------------------------------------------------------------------------------------------  Cardiac Enzymes No results for input(s): "CKMB", "TROPONINI", "MYOGLOBIN" in the last 168 hours.  Invalid input(s): "CK" ------------------------------------------------------------------------------------------------------------------    Component Value Date/Time   BNP 56.0 06/18/2020 1227     ---------------------------------------------------------------------------------------------------------------  Urinalysis    Component Value Date/Time   COLORURINE YELLOW 02/15/2024 1210   APPEARANCEUR CLOUDY (A) 02/15/2024 1210   APPEARANCEUR Cloudy (A) 02/14/2024 1522   LABSPEC 1.006 02/15/2024 1210   PHURINE 6.0 02/15/2024 1210   GLUCOSEU NEGATIVE 02/15/2024 1210   HGBUR SMALL (A) 02/15/2024 1210   BILIRUBINUR NEGATIVE 02/15/2024 1210   BILIRUBINUR Negative 02/14/2024 1522   KETONESUR NEGATIVE 02/15/2024 1210   PROTEINUR 30 (A) 02/15/2024 1210   UROBILINOGEN 1.0 04/22/2015 1103   NITRITE POSITIVE (A) 02/15/2024 1210   LEUKOCYTESUR LARGE (A) 02/15/2024 1210    ----------------------------------------------------------------------------------------------------------------   Imaging Results:    CT ABDOMEN PELVIS WO CONTRAST Result Date: 02/15/2024 CLINICAL DATA:  Abdominal pain. EXAM: CT ABDOMEN AND PELVIS WITHOUT CONTRAST TECHNIQUE: Multidetector CT imaging of the abdomen and  pelvis was performed following the standard protocol without IV contrast. RADIATION DOSE REDUCTION: This exam was performed according to the departmental dose-optimization program which includes automated exposure control, adjustment of the mA and/or kV according to patient size and/or use of iterative reconstruction technique. COMPARISON:  CT abdomen pelvis dated 01/26/2024. FINDINGS: Evaluation of this exam is limited in the absence of intravenous contrast. Lower chest: The visualized lung bases are clear. There is coronary vascular calcification. No intra-abdominal free air or free fluid. Hepatobiliary: Slight irregularity of the liver contour may represent early changes of cirrhosis. Clinical correlation is recommended. No biliary dilatation. Cholecystectomy. Pancreas: Unremarkable. No pancreatic ductal dilatation or surrounding inflammatory changes. Spleen: Normal in size without focal abnormality. Adrenals/Urinary Tract: The adrenal glands are unremarkable. There is no hydronephrosis or nephrolithiasis on either side. Small bilateral renal cysts. There is mild left perinephric haziness and stranding. Correlation with urinalysis recommended to exclude UTI. The visualized ureters appear unremarkable. The urinary bladder is mildly distended. There is apparent diffuse thickening of the bladder wall which may be partly related to underdistention. Cystitis is not excluded. Correlation with urinalysis recommended. Stomach/Bowel: There is no bowel obstruction or active inflammation. The appendix is not visualized with certainty. No inflammatory changes identified in the right lower quadrant. Vascular/Lymphatic: Moderate aortoiliac atherosclerotic disease. The IVC is unremarkable. No portal venous gas. There is no adenopathy. Reproductive: Hysterectomy.  No suspicious adnexal masses. Other: Ventral hernia repair mesh. There is a small fat containing hernia along the left lateral aspect of the mesh. Musculoskeletal: No  acute osseous pathology. IMPRESSION: 1. No hydronephrosis or nephrolithiasis. 2. Mild left perinephric haziness  and stranding. Correlation with urinalysis recommended to exclude UTI. 3. No bowel obstruction. Electronically Signed   By: Elgie Collard M.D.   On: 02/15/2024 14:44     NSR Vent. rate 68 BPM PR interval 176 ms QRS duration 155 ms QT/QTcB 471/501 ms P-R-T axes 47 177 28 Sinus rhythm RBBB and LPFB  Assessment & Plan:    Principal Problem:   Acute pyelonephritis Active Problems:   Coronary artery disease   GERD (gastroesophageal reflux disease)   OSA (obstructive sleep apnea)   Morbid (severe) obesity due to excess calories (HCC)   Diabetes (HCC)   Thrombocytopenia, unspecified (HCC)   Diabetic neuropathy (HCC)   Hypertension associated with diabetes (HCC)   Dyslipidemia due to type 2 diabetes mellitus (HCC)   Tobacco user  UTI/Acute pyelonephritis - Patient presents with UTI symptoms, CVA tenderness, imaging significant for left kidney pyelonephritis, her UA is positiv,. - Follow-up blood cultures. - Follow urine cultures(unsure if it will grow any bacteria given she has been on p.o. antibiotics as an outpatient), but recent urine culture as an outpatient showing pansensitive E. Coli. - Continue with IV Rocephin.  Lactic acidosis(no evidence of sepsis on admission) -Mild, due to volume depletion and dehydration, resolved with IV fluids  Hypertension -Home medication as blood pressure is soft, resume once stable  History of CAD - Continue with aspirin, statin - Cardiac meds on hold given soft blood pressure, resume when stable.  Thrombocytopenia - Years to be improving, up from 75 yesterday to 111 today, most likely due to infection, continue to monitor  Morbid obesity -Body mass index is 53.96 kg/m. - Follow with PCP - She is on oral semaglutide  Diabetes mellitus, type II, insulin-dependent - Will resume insulin at a lower dose 45-25 at bedtime, as  her current blood glucose is 100, will add insulin sliding scale  Hyperlipidemia - Continue with statin  AKI on CKD stage IIIb - Due to volume depletion, dehydration, will hold ARB, nephrotoxic medication, avoid blood pressure and continue with IV fluids  Hypokalemia - Replaced, recheck in a.m.  Hyponatremia - Volume depletion and dehydration, should improve with IV fluids  Prolonged QTc -likely due to hypokalemia, will replace, monitor on telemetry, magnesium within normal limit, avoid prolonging agents  Bacot abuse - She was counseled, started on nicotine patch   DVT Prophylaxis Heparin   AM Labs Ordered, also please review Full Orders  Family Communication: Admission, patients condition and plan of care including tests being ordered have been discussed with the patient who indicate understanding and agree with the plan and Code Status.  Code Status code  Likely DC to home  Consults called: None  Admission status: Inpatient  Time spent in minutes : 70 minutes   Huey Bienenstock M.D on 02/15/2024 at 3:46 PM   [facility] - Office  [phone]

## 2024-02-15 NOTE — Progress Notes (Signed)
 Elink is following code sepsis.

## 2024-02-16 DIAGNOSIS — A419 Sepsis, unspecified organism: Secondary | ICD-10-CM | POA: Diagnosis present

## 2024-02-16 DIAGNOSIS — N179 Acute kidney failure, unspecified: Secondary | ICD-10-CM

## 2024-02-16 DIAGNOSIS — R6520 Severe sepsis without septic shock: Secondary | ICD-10-CM

## 2024-02-16 LAB — CBC
HCT: 34.8 % — ABNORMAL LOW (ref 36.0–46.0)
Hemoglobin: 11 g/dL — ABNORMAL LOW (ref 12.0–15.0)
MCH: 30.1 pg (ref 26.0–34.0)
MCHC: 31.6 g/dL (ref 30.0–36.0)
MCV: 95.1 fL (ref 80.0–100.0)
Platelets: 107 10*3/uL — ABNORMAL LOW (ref 150–400)
RBC: 3.66 MIL/uL — ABNORMAL LOW (ref 3.87–5.11)
RDW: 16.3 % — ABNORMAL HIGH (ref 11.5–15.5)
WBC: 5.5 10*3/uL (ref 4.0–10.5)
nRBC: 0 % (ref 0.0–0.2)

## 2024-02-16 LAB — BASIC METABOLIC PANEL WITH GFR
Anion gap: 8 (ref 5–15)
BUN: 20 mg/dL (ref 8–23)
CO2: 24 mmol/L (ref 22–32)
Calcium: 8.3 mg/dL — ABNORMAL LOW (ref 8.9–10.3)
Chloride: 107 mmol/L (ref 98–111)
Creatinine, Ser: 1.92 mg/dL — ABNORMAL HIGH (ref 0.44–1.00)
GFR, Estimated: 29 mL/min — ABNORMAL LOW (ref >60)
Glucose, Bld: 97 mg/dL (ref 70–99)
Potassium: 3.7 mmol/L (ref 3.5–5.1)
Sodium: 139 mmol/L (ref 135–145)

## 2024-02-16 LAB — COVID-19, FLU A+B AND RSV
Influenza A, NAA: NOT DETECTED
Influenza B, NAA: NOT DETECTED
RSV, NAA: NOT DETECTED
SARS-CoV-2, NAA: NOT DETECTED

## 2024-02-16 LAB — GLUCOSE, CAPILLARY
Glucose-Capillary: 99 mg/dL (ref 70–99)
Glucose-Capillary: 94 mg/dL (ref 70–99)
Glucose-Capillary: 100 mg/dL — ABNORMAL HIGH (ref 70–99)
Glucose-Capillary: 97 mg/dL (ref 70–99)

## 2024-02-16 LAB — HIV ANTIBODY (ROUTINE TESTING W REFLEX): HIV Screen 4th Generation wRfx: NONREACTIVE

## 2024-02-16 MED ORDER — RISAQUAD PO CAPS
2.0000 | ORAL_CAPSULE | Freq: Three times a day (TID) | ORAL | Status: DC
  Administered 2024-02-16 – 2024-02-17 (×5): 2 via ORAL
  Filled 2024-02-16 (×5): qty 2

## 2024-02-16 MED ORDER — SEMAGLUTIDE 14 MG PO TABS
14.0000 mg | ORAL_TABLET | Freq: Every day | ORAL | Status: DC
  Filled 2024-02-16 (×3): qty 1

## 2024-02-16 MED ORDER — SODIUM CHLORIDE 0.9 % IV SOLN
2.0000 g | INTRAVENOUS | Status: DC
  Administered 2024-02-16 – 2024-02-17 (×2): 2 g via INTRAVENOUS
  Filled 2024-02-16 (×2): qty 20

## 2024-02-16 MED ORDER — PHENAZOPYRIDINE HCL 100 MG PO TABS
200.0000 mg | ORAL_TABLET | Freq: Three times a day (TID) | ORAL | Status: DC
  Administered 2024-02-16 – 2024-02-17 (×5): 200 mg via ORAL
  Filled 2024-02-16 (×5): qty 2

## 2024-02-16 MED ORDER — FUROSEMIDE 20 MG PO TABS
20.0000 mg | ORAL_TABLET | Freq: Every day | ORAL | Status: DC
Start: 2024-02-17 — End: 2024-02-17
  Administered 2024-02-17: 20 mg via ORAL
  Filled 2024-02-16: qty 1

## 2024-02-16 MED ORDER — HEPARIN SODIUM (PORCINE) 5000 UNIT/ML IJ SOLN
5000.0000 [IU] | Freq: Three times a day (TID) | INTRAMUSCULAR | Status: DC
  Administered 2024-02-17 (×2): 5000 [IU] via SUBCUTANEOUS
  Filled 2024-02-16 (×2): qty 1

## 2024-02-16 NOTE — Evaluation (Signed)
 Physical Therapy Evaluation Patient Details Name: [name] MRN: [id number] DOB: Jun 20, 1961 Today's Date: 02/16/2024  History of Present Illness  a 63 y.o. female, with past medical history of CAD, tobacco abuse, hypertension, hyperlipidemia, diabetes mellitus, patient diagnosed by UTI by PCP, urine culture growing E. coli, pansensitive, she was treated with p.o. antibiotic as an outpatient, she kept having symptoms, seen by PCP again yesterday where she was ordered p.o. Cipro but she has not started yet, she kept having symptoms including dysuria, polyuria, abdominal pain and discomfort which prompted her to come to ED, she reports generalized weakness, poor fluid and food intake  Clinical Impression   Pt tolerated today's Physical Therapy Evaluation, decently, limited by fatigue and lethargy. Pt with reduced motivation and desire to move. Pt's husband and son present and endorsing pt's limited motivation to move at home. Pt's baseline is independent with ambulation with AD and furniture surfing, husband completes all ADLs except for bathing and dressing. Pt uses E-scooter for community ambulation. Currently, pt demonstrating increased guarding due to unsteadiness during transfer due to muscle weakness, deconditioning. Pt would be good candidate for skilled rehab in another setting for increased muscular strength and conditioning gains but pt and family endorse that pt is not motivated. Would like outpatient services setup across from their heart doctor.  Based on limited ambulation distance and unsteadiness, pt would benefit from more intense rehab, but perceive that this pt performing slightly reduced from baseline. HHPT vs OP PT would benefit patient at this point as she is limited to household ambulation currently.  Based upon these deficits/impairments, patient will benefit from continued skilled physical therapy services during remainder of hospital stay and at the next recommended venue of care  to address deficits and promote return to optimal function.                If plan is discharge home, recommend the following: A little help with walking and/or transfers;A little help with bathing/dressing/bathroom   Can travel by private vehicle        Equipment Recommendations None recommended by PT  Recommendations for Other Services       Functional Status Assessment Patient has had a recent decline in their functional status and demonstrates the ability to make significant improvements in function in a reasonable and predictable amount of time.     Precautions / Restrictions        Mobility  Bed Mobility Overal bed mobility: Needs Assistance Bed Mobility: Sit to Supine       Sit to supine: Supervision   General bed mobility comments: Received pt sitting EOB with RLE dangling. Pt with supervision for return to supine position with HOB > 45 degrees elevated Patient Response: Cooperative  Transfers Overall transfer level: Needs assistance Equipment used: Straight cane Transfers: Sit to/from Stand Sit to Stand: Contact guard assist           General transfer comment: sit/stand from EOB with CGA for balancing. BP sitting EOB was 118/50; attempted to take standing but pt had to sit down before finished reading BP. Reading after return to sitting 128/60    Ambulation/Gait               General Gait Details: Not attempted at this time due to reduced diastolic BP and symptomatic.  Stairs            Wheelchair Mobility     Tilt Bed Tilt Bed Patient Response: Cooperative  Modified Rankin (Stroke Patients Only)  Balance Overall balance assessment: Needs assistance Sitting-balance support: No upper extremity supported, Feet supported Sitting balance-Leahy Scale: Good Sitting balance - Comments: leaning on HOB.   Standing balance support: Single extremity supported, During functional activity Standing balance-Leahy Scale:  Fair Standing balance comment: Increased lateral swaying in standing.                             Pertinent Vitals/Pain Pain Assessment Pain Assessment: No/denies pain    Home Living Family/patient expects to be discharged to:: Private residence Living Arrangements: Spouse/significant other;Children;Other relatives Available Help at Discharge: Family Type of Home: Mobile home Home Access: Ramped entrance       Home Layout: One level Home Equipment: Shower seat;Grab bars - tub/shower;Rolling Walker (2 wheels);Cane - single point      Prior Function               Mobility Comments: Pt household ambulates with furniture surfing and AD, community Theatre stage manager. ADLs Comments: Pt's husband performs cooking and cleaning,     Extremity/Trunk Assessment   Upper Extremity Assessment Upper Extremity Assessment: Defer to OT evaluation    Lower Extremity Assessment Lower Extremity Assessment: Generalized weakness;RLE deficits/detail;LLE deficits/detail RLE Deficits / Details: 3-/5 grossly and painful RLE Sensation: WNL LLE Deficits / Details: 3-/5 grossly and painful LLE Sensation: WNL    Cervical / Trunk Assessment Cervical / Trunk Assessment: Kyphotic  Communication   Communication Communication: No apparent difficulties    Cognition Arousal: Alert Behavior During Therapy: WFL for tasks assessed/performed                             Following commands: Intact       Cueing Cueing Techniques: Verbal cues     General Comments      Exercises     Assessment/Plan    PT Assessment Patient needs continued PT services  PT Problem List Decreased strength;Decreased activity tolerance;Decreased balance;Decreased mobility       PT Treatment Interventions Gait training;Functional mobility training;Therapeutic activities;Therapeutic exercise;Balance training;Neuromuscular re-education    PT Goals (Current goals can be  found in the Care Plan section)  Acute Rehab PT Goals Patient Stated Goal: "go home" PT Goal Formulation: With patient Time For Goal Achievement: 03/01/24 Potential to Achieve Goals: Good    Frequency Min 3X/week     Co-evaluation               AM-PAC PT "6 Clicks" Mobility  Outcome Measure Help needed turning from your back to your side while in a flat bed without using bedrails?: None Help needed moving from lying on your back to sitting on the side of a flat bed without using bedrails?: None Help needed moving to and from a bed to a chair (including a wheelchair)?: A Little Help needed standing up from a chair using your arms (e.g., wheelchair or bedside chair)?: A Little Help needed to walk in hospital room?: A Lot Help needed climbing 3-5 steps with a railing? : Total 6 Click Score: 17    End of Session Equipment Utilized During Treatment: Gait belt   Patient left: in bed;with call bell/phone within reach;with bed alarm set;with family/visitor present Nurse Communication: Mobility status PT Visit Diagnosis: Unsteadiness on feet (R26.81);Other abnormalities of gait and mobility (R26.89);Muscle weakness (generalized) (M62.81)    Time: 1610-9604 PT Time Calculation (min) (ACUTE ONLY): 20 min   Charges:  PT Evaluation $PT Eval Low Complexity: 1 Low   PT General Charges $$ ACUTE PT VISIT: 1 Visit         Elie Goody, DPT [facility] [place] [phone] office  Nelida Meuse 02/16/2024, 10:28 AM

## 2024-02-16 NOTE — Progress Notes (Signed)
   02/16/24 0154  Assess: MEWS Score  Temp 99.6 F (37.6 C)  BP (!) 89/51  MAP (mmHg) (!) 63  Pulse Rate 79  Resp (!) 24  SpO2 94 %  O2 Device Room Air  Assess: MEWS Score  MEWS Temp 0  MEWS Systolic 1  MEWS Pulse 0  MEWS RR 1  MEWS LOC 0  MEWS Score 2  MEWS Score Color Yellow  Assess: SIRS CRITERIA  SIRS Temperature  0  SIRS Respirations  1  SIRS Pulse 0  SIRS WBC 0  SIRS Score Sum  1

## 2024-02-16 NOTE — Progress Notes (Signed)
 PROGRESS NOTE    Patient: [name]                            PCP: [name], DO                    DOB: [birth year]            DOA: 02/15/2024 [id number]             DOS: 02/16/2024, 10:58 AM   LOS: 1 day   Date of Service: The patient was seen and examined on 02/16/2024  Subjective:   The patient was seen and examined this morning. Hemodynamically stable. No issues overnight .  Brief Narrative:   [name]  is a 63 y.o. female, with past medical history of CAD, tobacco abuse, hypertension, hyperlipidemia, diabetes mellitus, patient diagnosed by UTI by PCP, urine culture growing E. coli, pansensitive, she was treated with p.o. antibiotic as an outpatient, she kept having symptoms, seen by PCP again yesterday where she was ordered p.o. Cipro but she has not started yet, she kept having symptoms including dysuria, polyuria, abdominal pain and discomfort which prompted her to come to ED, she reports generalized weakness, poor fluid and food intake.  ED: CT abdomen pelvis significant for left-sided pyelonephritis, UA significant for bacteriuria and pyuria, blood pressure soft initially responded to IV fluids, lactic acid mildly elevated but resolved with IV fluid, her creatinine up to 2.5, potassium low at 3.       Assessment & Plan:   Principal Problem:   Sepsis (HCC) Active Problems:   Acute pyelonephritis   Thrombocytopenia, unspecified (HCC)   Tobacco user   Coronary artery disease   GERD (gastroesophageal reflux disease)   OSA (obstructive sleep apnea)   Morbid (severe) obesity due to excess calories (HCC)   Diabetes (HCC)   Diabetic neuropathy (HCC)   Hypertension associated with diabetes (HCC)   Dyslipidemia due to type 2 diabetes mellitus (HCC)      Sepsis-met mild sepsis criteria on admission due  UTI/Acute pyelonephritis -Improved, hemodynamically stable now  POA: Met early sepsis criteria on admission due to tachypneic, hypotensive, with AKI,  mild lactic acidosis-with source of infection of UTI, pyelonephritis  POA: UTI symptoms, CVA tenderness, imaging significant for left kidney pyelonephritis, her UA is positiv,. - Follow-up blood cultures. - Follow urine cultures (unsure if it will grow any bacteria given she has been on p.o. antibiotics as an outpatient), but recent urine culture as an outpatient showing pansensitive E. Coli. - Continue with IV Rocephin.   Lactic acidosis -Lactic acid 2.1>> 1.0 -Proved with IV fluids    History of hypertension -Home BP meds on hold-POA soft BP   History of CAD - Continue with aspirin, statin - Cardiac meds on hold given soft blood pressure, resume when stable.   Thrombocytopenia - Improving, up from 75 >>111>> 107  today, most likely due to infection, continue to monitor   Morbid obesity -Body mass index is 53.96 kg/m. - Follow with PCP - She is on oral semaglutide   Diabetes mellitus, type II, insulin-dependent -Long-acting insulin at Lower dose 45-25 at bedtime,  -CBG q. ACHS, SSI coverage CBG (last 3)  Recent Labs    02/15/24 1643 02/15/24 2113 02/16/24 0723  GLUCAP 82 180* 99    Hyperlipidemia - Continue with statin   AKI on CKD stage IIIb - Due to volume depletion, dehydration, will hold ARB,  nephrotoxic medication, avoid blood pressure and continue with IV fluids Lab Results  Component Value Date   CREATININE 1.92 (H) 02/16/2024   CREATININE 2.28 (H) 02/15/2024   CREATININE 2.15 (H) 02/14/2024      Hypokalemia -POA potassium at 3.0 - replaced,    Hyponatremia -Resolved with IVF NS    Prolonged QTc -likely due to hypokalemia, will replace, monitor on telemetry, magnesium within normal limit, avoid prolonging agents   Tobacco abuse - She was counseled, started on nicotine patch     ----------------------------------------------------------------------------------------------------------------------------------------------- Nutritional status:   The patient's BMI is: Body mass index is 53.96 kg/m. I agree with the assessment and plan as outlined --------------------------------------------------------------------------------------------------------------------------------- Cultures; Blood Cultures x 2 >>  Urine Culture  >>>    ------------------------------------------------------------------------------------------------------------------------------------------------  DVT prophylaxis:  heparin injection 5,000 Units Start: 02/17/24 0600 Place and maintain sequential compression device Start: 02/16/24 0824 SCDs Start: 02/15/24 1643   Code Status:   Code Status: Full Code  Family Communication: Husband, son present at bedside-updated -Advance care planning has been discussed.   Admission status:   Status is: Inpatient Remains inpatient appropriate because: Treating sepsis/UTI/pyelonephritis of IV fluid IV antibiotics   Disposition: From  - home             Planning for discharge in 1-2 days: to home  Procedures:   No admission procedures for hospital encounter.   Antimicrobials:  Anti-infectives (From admission, onward)    Start     Dose/Rate Route Frequency Ordered Stop   02/16/24 1400  cefTRIAXone (ROCEPHIN) 1 g in sodium chloride 0.9 % 100 mL IVPB  Status:  Discontinued        1 g 200 mL/hr over 30 Minutes Intravenous Every 24 hours 02/15/24 1628 02/16/24 0812   02/16/24 1200  cefTRIAXone (ROCEPHIN) 2 g in sodium chloride 0.9 % 100 mL IVPB        2 g 200 mL/hr over 30 Minutes Intravenous Every 24 hours 02/16/24 0812 02/19/24 1159   02/15/24 1215  cefTRIAXone (ROCEPHIN) 1 g in sodium chloride 0.9 % 100 mL IVPB        1 g 200 mL/hr over 30 Minutes Intravenous  Once 02/15/24 1208 02/15/24 1320        Medication:   acidophilus  2 capsule Oral TID   aspirin EC  325 mg Oral Daily   [START ON 02/17/2024] furosemide  20 mg Oral Daily   gabapentin  900 mg Oral TID   [START ON 02/17/2024] heparin  5,000  Units Subcutaneous Q8H   insulin aspart  0-15 Units Subcutaneous TID WC   insulin aspart  0-5 Units Subcutaneous QHS   insulin glargine-yfgn  25 Units Subcutaneous QHS   nicotine  21 mg Transdermal Daily   oxybutynin  5 mg Oral QHS   pantoprazole  40 mg Oral Daily   phenazopyridine  200 mg Oral TID WC   rosuvastatin  20 mg Oral QHS   Semaglutide  14 mg Oral Daily    acetaminophen **OR** acetaminophen, albuterol, hydrALAZINE, HYDROcodone-acetaminophen, nitroGLYCERIN   Objective:   Vitals:   02/15/24 2111 02/16/24 0154 02/16/24 0300 02/16/24 1021  BP: 92/68 (!) 89/51 (!) 113/55 125/62  Pulse: 77 79 80 76  Resp: 20 (!) 24    Temp: 98.2 F (36.8 C) 99.6 F (37.6 C) 98.9 F (37.2 C) 98.2 F (36.8 C)  TempSrc: Oral Oral Oral Oral  SpO2: 95% 94% 93% 93%  Weight:      Height:  Intake/Output Summary (Last 24 hours) at 02/16/2024 1058 Last data filed at 02/16/2024 0400 Gross per 24 hour  Intake 3503.04 ml  Output --  Net 3503.04 ml   Filed Weights   02/15/24 1149  Weight: 133.8 kg     Physical examination:   Constitution:  Alert, cooperative, no distress,  Appears calm and comfortable  Psychiatric:   Normal and stable mood and affect, cognition intact,   HEENT:        Normocephalic, PERRL, otherwise with in Normal limits  Chest:         Chest symmetric Cardio vascular:  S1/S2, RRR, No murmure, No Rubs or Gallops  pulmonary: Clear to auscultation bilaterally, respirations unlabored, negative wheezes / crackles Abdomen: Soft, non-tender, non-distended, bowel sounds,no masses, no organomegaly Muscular skeletal: Limited exam - in bed, able to move all 4 extremities,   Neuro: CNII-XII intact. , normal motor and sensation, reflexes intact  Extremities: No pitting edema lower extremities, +2 pulses  Skin: Dry, warm to touch, negative for any Rashes, No open wounds Wounds: per nursing  documentation   ------------------------------------------------------------------------------------------------------------------------------------------    LABs:     Latest Ref Rng & Units 02/16/2024    4:12 AM 02/15/2024   12:18 PM 02/14/2024    3:22 PM  CBC  WBC 4.0 - 10.5 K/uL 5.5  7.0  8.1   Hemoglobin 12.0 - 15.0 g/dL 16.1  09.6  04.5   Hematocrit 36.0 - 46.0 % 34.8  37.4  38.1   Platelets 150 - 400 K/uL 107  111  75       Latest Ref Rng & Units 02/16/2024    4:12 AM 02/15/2024   12:18 PM 02/14/2024    3:22 PM  CMP  Glucose 70 - 99 mg/dL 97  409  91   BUN 8 - 23 mg/dL 20  25  22    Creatinine 0.44 - 1.00 mg/dL 8.11  9.14  7.82   Sodium 135 - 145 mmol/L 139  133  138   Potassium 3.5 - 5.1 mmol/L 3.7  3.0  3.4   Chloride 98 - 111 mmol/L 107  95  98   CO2 22 - 32 mmol/L 24  23  21    Calcium 8.9 - 10.3 mg/dL 8.3  8.6  8.9   Total Protein 6.5 - 8.1 g/dL  7.0  6.6   Total Bilirubin 0.0 - 1.2 mg/dL  1.4  1.3   Alkaline Phos 38 - 126 U/L  134  145   AST 15 - 41 U/L  25  15   ALT 0 - 44 U/L  15  12        Micro Results Recent Results (from the past 240 hours)  COVID-19, Flu A+B and RSV     Status: None   Collection Time: 02/14/24  3:19 PM   Specimen: Nasal Swab  Result Value Ref Range Status   SARS-CoV-2, NAA Not Detected Not Detected Final   Influenza A, NAA Not Detected Not Detected Final   Influenza B, NAA Not Detected Not Detected Final   RSV, NAA Not Detected Not Detected Final   Test Information: Comment  Final    Comment: This nucleic acid amplification test was developed and its performance characteristics determined by [employer]. Nucleic acid amplification tests include RT-PCR and TMA. This test has not been FDA cleared or approved. This test has been authorized by FDA under an Emergency Use Authorization (EUA). This test is only authorized for the duration of  time the declaration that circumstances exist justifying the authorization of the emergency  use of in vitro diagnostic tests for detection of SARS-CoV-2 virus and/or diagnosis of COVID-19 infection under section 564(b)(1) of the Act, 21 U.S.C. 161WRU-0(A) (1), unless the authorization is terminated or revoked sooner. When diagnostic testing is negative, the possibility of a false negative result should be considered in the context of a patient's recent exposures and the presence of clinical signs and symptoms consistent with COVID-19. An individual without symptoms of COVID-19 and who is not shedding SARS-CoV-2 virus wo uld expect to have a negative (not detected) result in this assay.   Urine Culture     Status: Abnormal (Preliminary result)   Collection Time: 02/14/24  3:22 PM   Specimen: Urine   UR  Result Value Ref Range Status   Urine Culture, Routine Preliminary report (A)  Preliminary   Organism ID, Bacteria Gram negative rods (A)  Preliminary    Comment: Greater than 100,000 colony forming units per mL  Microscopic Examination     Status: Abnormal   Collection Time: 02/14/24  3:22 PM   Urine  Result Value Ref Range Status   WBC, UA >30 (A) 0 - 5 /hpf Final   RBC, Urine 0-2 0 - 2 /hpf Final   Epithelial Cells (non renal) 0-10 0 - 10 /hpf Final   Renal Epithel, UA None seen None seen /hpf Final   Bacteria, UA Many (A) None seen/Few Final   Yeast, UA None seen None seen Final  Culture, blood (routine x 2)     Status: None (Preliminary result)   Collection Time: 02/15/24 12:18 PM   Specimen: BLOOD  Result Value Ref Range Status   Specimen Description BLOOD BLOOD RIGHT ARM  Final   Special Requests   Final    BOTTLES DRAWN AEROBIC AND ANAEROBIC Blood Culture adequate volume   Culture   Final    NO GROWTH < 24 HOURS Performed at [facility], [address] [place], [place] [address]    Report Status PENDING  Incomplete  Culture, blood (routine x 2)     Status: None (Preliminary result)   Collection Time: 02/15/24 12:18 PM   Specimen: BLOOD  Result Value Ref  Range Status   Specimen Description BLOOD BLOOD LEFT ARM  Final   Special Requests   Final    BOTTLES DRAWN AEROBIC AND ANAEROBIC Blood Culture results may not be optimal due to an inadequate volume of blood received in culture bottles   Culture   Final    NO GROWTH < 24 HOURS Performed at [facility], [address], [place], [place] [address]    Report Status PENDING  Incomplete  Resp panel by RT-PCR (RSV, Flu A&B, Covid) Anterior Nasal Swab     Status: None   Collection Time: 02/15/24  1:45 PM   Specimen: Anterior Nasal Swab  Result Value Ref Range Status   SARS Coronavirus 2 by RT PCR NEGATIVE NEGATIVE Final    Comment: (NOTE) SARS-CoV-2 target nucleic acids are NOT DETECTED.  The SARS-CoV-2 RNA is generally detectable in upper respiratory specimens during the acute phase of infection. The lowest concentration of SARS-CoV-2 viral copies this assay can detect is 138 copies/mL. A negative result does not preclude SARS-Cov-2 infection and should not be used as the sole basis for treatment or other patient management decisions. A negative result may occur with  improper specimen collection/handling, submission of specimen other than nasopharyngeal swab, presence of viral mutation(s) within  the areas targeted by this assay, and inadequate number of viral copies(<138 copies/mL). A negative result must be combined with clinical observations, patient history, and epidemiological information. The expected result is Negative.  Fact Sheet for Patients:  BloggerCourse.com  Fact Sheet for Healthcare Providers:  SeriousBroker.it  This test is no t yet approved or cleared by the [place] FDA and  has been authorized for detection and/or diagnosis of SARS-CoV-2 by FDA under an Emergency Use Authorization (EUA). This EUA will remain  in effect (meaning this test can be used) for the duration of the COVID-19 declaration under Section  564(b)(1) of the Act, 21 U.S.C.section 360bbb-3(b)(1), unless the authorization is terminated  or revoked sooner.       Influenza A by PCR NEGATIVE NEGATIVE Final   Influenza B by PCR NEGATIVE NEGATIVE Final    Comment: (NOTE) The Xpert Xpress SARS-CoV-2/FLU/RSV plus assay is intended as an aid in the diagnosis of influenza from Nasopharyngeal swab specimens and should not be used as a sole basis for treatment. Nasal washings and aspirates are unacceptable for Xpert Xpress SARS-CoV-2/FLU/RSV testing.  Fact Sheet for Patients: BloggerCourse.com  Fact Sheet for Healthcare Providers: SeriousBroker.it  This test is not yet approved or cleared by the [place] FDA and has been authorized for detection and/or diagnosis of SARS-CoV-2 by FDA under an Emergency Use Authorization (EUA). This EUA will remain in effect (meaning this test can be used) for the duration of the COVID-19 declaration under Section 564(b)(1) of the Act, 21 U.S.C. section 360bbb-3(b)(1), unless the authorization is terminated or revoked.     Resp Syncytial Virus by PCR NEGATIVE NEGATIVE Final    Comment: (NOTE) Fact Sheet for Patients: BloggerCourse.com  Fact Sheet for Healthcare Providers: SeriousBroker.it  This test is not yet approved or cleared by the [place] FDA and has been authorized for detection and/or diagnosis of SARS-CoV-2 by FDA under an Emergency Use Authorization (EUA). This EUA will remain in effect (meaning this test can be used) for the duration of the COVID-19 declaration under Section 564(b)(1) of the Act, 21 U.S.C. section 360bbb-3(b)(1), unless the authorization is terminated or revoked.  Performed at [facility], [address], [place], [place] [address]     Radiology Reports CT ABDOMEN PELVIS WO CONTRAST Result Date: 02/15/2024 CLINICAL DATA:  Abdominal pain. EXAM: CT  ABDOMEN AND PELVIS WITHOUT CONTRAST TECHNIQUE: Multidetector CT imaging of the abdomen and pelvis was performed following the standard protocol without IV contrast. RADIATION DOSE REDUCTION: This exam was performed according to the departmental dose-optimization program which includes automated exposure control, adjustment of the mA and/or kV according to patient size and/or use of iterative reconstruction technique. COMPARISON:  CT abdomen pelvis dated 01/26/2024. FINDINGS: Evaluation of this exam is limited in the absence of intravenous contrast. Lower chest: The visualized lung bases are clear. There is coronary vascular calcification. No intra-abdominal free air or free fluid. Hepatobiliary: Slight irregularity of the liver contour may represent early changes of cirrhosis. Clinical correlation is recommended. No biliary dilatation. Cholecystectomy. Pancreas: Unremarkable. No pancreatic ductal dilatation or surrounding inflammatory changes. Spleen: Normal in size without focal abnormality. Adrenals/Urinary Tract: The adrenal glands are unremarkable. There is no hydronephrosis or nephrolithiasis on either side. Small bilateral renal cysts. There is mild left perinephric haziness and stranding. Correlation with urinalysis recommended to exclude UTI. The visualized ureters appear unremarkable. The urinary bladder is mildly distended. There is apparent diffuse thickening of the bladder wall which may be partly related to underdistention. Cystitis is  not excluded. Correlation with urinalysis recommended. Stomach/Bowel: There is no bowel obstruction or active inflammation. The appendix is not visualized with certainty. No inflammatory changes identified in the right lower quadrant. Vascular/Lymphatic: Moderate aortoiliac atherosclerotic disease. The IVC is unremarkable. No portal venous gas. There is no adenopathy. Reproductive: Hysterectomy.  No suspicious adnexal masses. Other: Ventral hernia repair mesh. There is a  small fat containing hernia along the left lateral aspect of the mesh. Musculoskeletal: No acute osseous pathology. IMPRESSION: 1. No hydronephrosis or nephrolithiasis. 2. Mild left perinephric haziness and stranding. Correlation with urinalysis recommended to exclude UTI. 3. No bowel obstruction. Electronically Signed   By: Elgie Collard M.D.   On: 02/15/2024 14:44    SIGNED: Kendell Bane, MD, FHM. FAAFP. [facility] - [facility] Time spent - 55 min.  In seeing, evaluating and examining the patient. Reviewing medical records, labs, drawn plan of care. [facility],  Pager (please use [email or web address] to page/ text) Please use Epic Secure Chat for non-urgent communication (7AM-7PM)  If 7PM-7AM, please contact night-coverage [email or web address], 02/16/2024, 10:58 AM

## 2024-02-16 NOTE — Hospital Course (Addendum)
 [name]  is a 63 y.o. female, with past medical history of CAD, tobacco abuse, hypertension, hyperlipidemia, diabetes mellitus, patient diagnosed by UTI by PCP, urine culture growing E. coli, pansensitive, she was treated with p.o. antibiotic as an outpatient, she kept having symptoms, seen by PCP again yesterday where she was ordered p.o. Cipro but she has not started yet, she kept having symptoms including dysuria, polyuria, abdominal pain and discomfort which prompted her to come to ED, she reports generalized weakness, poor fluid and food intake.  ED: CT abdomen pelvis significant for left-sided pyelonephritis, UA significant for bacteriuria and pyuria, blood pressure soft initially responded to IV fluids, lactic acid mildly elevated but resolved with IV fluid, her creatinine up to 2.5, potassium low at 3.       Assessment & Plan:   Principal Problem:   Sepsis (HCC) Active Problems:   Acute pyelonephritis   Thrombocytopenia, unspecified (HCC)   Tobacco user   Coronary artery disease   GERD (gastroesophageal reflux disease)   OSA (obstructive sleep apnea)   Morbid (severe) obesity due to excess calories (HCC)   Diabetes (HCC)   Diabetic neuropathy (HCC)   Hypertension associated with diabetes (HCC)   Dyslipidemia due to type 2 diabetes mellitus (HCC)      Sepsis-met mild sepsis criteria on admission due  UTI/Acute pyelonephritis -Improved, hemodynamically stable now  POA: Met early sepsis criteria on admission due to tachypneic, hypotensive, with AKI, mild lactic acidosis-with source of infection of UTI, pyelonephritis  POA: UTI symptoms, CVA tenderness, imaging significant for left kidney pyelonephritis, her UA is positiv,. - Follow-up blood cultures. - Follow urine cultures (unsure if it will grow any bacteria given she has been on p.o. antibiotics as an outpatient), but recent urine culture as an outpatient showing pansensitive E. Coli. - Continue with IV Rocephin.    Lactic acidosis -Lactic acid 2.1>> 1.0 -Proved with IV fluids    History of hypertension -Home BP meds on hold-POA soft BP   History of CAD - Continue with aspirin, statin - Cardiac meds on hold given soft blood pressure, resume when stable.   Thrombocytopenia - Improving, up from 75 >>111>> 107  today, most likely due to infection, continue to monitor   Morbid obesity -Body mass index is 53.96 kg/m. - Follow with PCP - She is on oral semaglutide   Diabetes mellitus, type II, insulin-dependent -Long-acting insulin at Lower dose 45-25 at bedtime,  -CBG q. ACHS, SSI coverage CBG (last 3)  Recent Labs    02/15/24 1643 02/15/24 2113 02/16/24 0723  GLUCAP 82 180* 99    Hyperlipidemia - Continue with statin   AKI on CKD stage IIIb - Due to volume depletion, dehydration, will hold ARB, nephrotoxic medication, avoid blood pressure and continue with IV fluids Lab Results  Component Value Date   CREATININE 1.92 (H) 02/16/2024   CREATININE 2.28 (H) 02/15/2024   CREATININE 2.15 (H) 02/14/2024      Hypokalemia -POA potassium at 3.0 - replaced,    Hyponatremia -Resolved with IVF NS    Prolonged QTc -likely due to hypokalemia, will replace, monitor on telemetry, magnesium within normal limit, avoid prolonging agents   Tobacco abuse - She was counseled, started on nicotine patch

## 2024-02-16 NOTE — Plan of Care (Signed)
   Problem: Education: Goal: Knowledge of General Education information will improve Description: Including pain rating scale, medication(s)/side effects and non-pharmacologic comfort measures Outcome: Progressing   Problem: Clinical Measurements: Goal: Ability to maintain clinical measurements within normal limits will improve Outcome: Progressing Goal: Diagnostic test results will improve Outcome: Progressing

## 2024-02-16 NOTE — TOC Initial Note (Signed)
 Transition of Care [facility]) - Initial/Assessment Note    Patient Details  Name: [name] MRN: [id number] Date of Birth: 1961-09-29  Transition [facility] Care [facility]) CM/SW Contact:    Leitha Bleak, RN Phone Number: 02/16/2024, 11:04 AM  Clinical Narrative:        Patient admitted with sepsis. Patient lives home with her husband. PT is recommending HHPT. CM spoke with patient. She is active with [facility] for PT and RN for wrapping her legs.  MD aware to place resumption orders, Artavia updated with hospital admission to follow for start of care.             Barriers to Discharge: Continued Medical Work up   Patient Goals and CMS Choice Patient states their goals for this hospitalization and ongoing recovery are:: Return home with home health CMS Medicare.gov Compare Post Acute Care list provided to:: Patient Choice offered to / list presented to : Patient [place] ownership interest in [facility].provided to:: Patient    Expected Discharge Plan and Services     Post Acute Care Choice: Home Health Living arrangements for the past 2 months: Single Family Home                    Prior Living Arrangements/Services Living arrangements for the past 2 months: Single Family Home Lives with:: Spouse            Activities of Daily Living   ADL Screening (condition at time of admission) Independently performs ADLs?: Yes (appropriate for developmental age) Is the patient deaf or have difficulty hearing?: No Does the patient have difficulty seeing, even when wearing glasses/contacts?: No Does the patient have difficulty concentrating, remembering, or making decisions?: No  Permission Sought/Granted      Emotional Assessment     Affect (typically observed): Accepting Orientation: : Oriented to Self, Oriented to Place, Oriented to  Time, Oriented to Situation Alcohol / Substance Use: Not Applicable Psych Involvement: No (comment)  Admission diagnosis:   Hypokalemia [E87.6] Pyelonephritis [N12] Acute kidney injury (HCC) [N17.9] Acute pyelonephritis [N10] Sepsis, due to unspecified organism, unspecified whether acute organ dysfunction present [facility]) [A41.9] Patient Active Problem List   Diagnosis Date Noted   Sepsis (HCC) 02/16/2024   Acute pyelonephritis 02/15/2024   Bilateral renal cysts 08/06/2022   Unstable angina (HCC) 06/18/2020   Low back pain 04/11/2019   Moderate nonproliferative diabetic retinopathy of both eyes without macular edema associated with type 2 diabetes mellitus (HCC) 01/05/2019   Hypertension associated with diabetes (HCC) 09/11/2015   Spinal stenosis, lumbar region, with neurogenic claudication 04/25/2015   Arthritis associated with diabetes (HCC) 08/28/2014   Peripheral edema 08/28/2014   Diabetic neuropathy (HCC) 02/18/2014   External bleeding hemorrhoids 04/10/2013   Morbid (severe) obesity due to excess calories (HCC) 04/09/2013   Diabetes (HCC) 04/09/2013   Thrombocytopenia, unspecified (HCC) 04/09/2013   OSA (obstructive sleep apnea) 07/06/2011   DOE (dyspnea on exertion) 06/06/2011   Coronary artery disease 02/03/2011   GERD (gastroesophageal reflux disease) 02/03/2011   Noncompliance 02/03/2011   Generalized anxiety disorder 02/03/2011   Insomnia 02/03/2011   Dyslipidemia due to type 2 diabetes mellitus (HCC) 02/03/2011   Tobacco user 02/03/2011   PCP:  [name], DO Pharmacy:   [facility] [address] [place] - [address] [place] [address] Phone: [phone] Fax: [phone]  [facility]) - [place], [place] - [address] [phone]  [address] [employer] [address] [place] [address] Phone: [phone] Fax: [phone]  [facility] - [place], [place] - [address] [place] [address] Phone: [phone] Fax: [phone]    Social Drivers of Health  (SDOH) Social History: SDOH Screenings   Food Insecurity: No Food Insecurity (02/15/2024)  Housing: Low Risk  (02/15/2024)  Transportation Needs: No Transportation Needs (02/15/2024)  Utilities: Not At Risk (02/15/2024)  Alcohol Screen: Low Risk  (01/10/2024)  Depression (PHQ2-9): Medium Risk (01/10/2024)  Financial Resource Strain: Medium Risk (01/10/2024)  Physical Activity: Inactive (01/10/2024)  Social Connections: Moderately Integrated (02/15/2024)  Stress: No Stress Concern Present (01/10/2024)  Tobacco Use: High Risk (02/14/2024)  Health Literacy: Adequate Health Literacy (01/10/2024)   SDOH Interventions:    Readmission Risk Interventions    02/15/2024    7:08 PM  Readmission Risk Prevention Plan  Post Dischage Appt Complete  Medication Screening Complete  Transportation Screening Complete

## 2024-02-16 NOTE — Plan of Care (Signed)
   Problem: Education: Goal: Knowledge of General Education information will improve Description Including pain rating scale, medication(s)/side effects and non-pharmacologic comfort measures Outcome: Progressing   Problem: Health Behavior/Discharge Planning: Goal: Ability to manage health-related needs will improve Outcome: Progressing

## 2024-02-16 NOTE — Plan of Care (Signed)
  Problem: Acute Rehab PT Goals(only PT should resolve) Goal: Pt Will Go Supine/Side To Sit Flowsheets (Taken 02/16/2024 1032) Pt will go Supine/Side to Sit: Independently Goal: Patient Will Perform Sitting Balance Flowsheets (Taken 02/16/2024 1032) Patient will perform sitting balance: Independently Goal: Patient Will Transfer Sit To/From Stand Flowsheets (Taken 02/16/2024 1032) Patient will transfer sit to/from stand: Independently Goal: Pt Will Transfer Bed To Chair/Chair To Bed Flowsheets (Taken 02/16/2024 1032) Pt will Transfer Bed to Chair/Chair to Bed: Independently Goal: Pt Will Ambulate Flowsheets (Taken 02/16/2024 1032) Pt will Ambulate:  50 feet  Independently  with rolling walker  with cane   Elie Goody, DPT [facility] [place] [phone] office

## 2024-02-17 DIAGNOSIS — R6520 Severe sepsis without septic shock: Secondary | ICD-10-CM | POA: Diagnosis not present

## 2024-02-17 DIAGNOSIS — N179 Acute kidney failure, unspecified: Secondary | ICD-10-CM | POA: Diagnosis not present

## 2024-02-17 DIAGNOSIS — A419 Sepsis, unspecified organism: Secondary | ICD-10-CM | POA: Diagnosis not present

## 2024-02-17 LAB — URINE CULTURE: Culture: >=100000 — AB

## 2024-02-17 LAB — GLUCOSE, CAPILLARY
Glucose-Capillary: 88 mg/dL (ref 70–99)
Glucose-Capillary: 146 mg/dL — ABNORMAL HIGH (ref 70–99)

## 2024-02-17 MED ORDER — CIPROFLOXACIN HCL 250 MG PO TABS: 250.0000 mg | ORAL_TABLET | Freq: Two times a day (BID) | ORAL | 0 refills | Status: AC

## 2024-02-17 MED ORDER — OXYCODONE HCL 5 MG PO TABS: 5.0000 mg | ORAL_TABLET | Freq: Four times a day (QID) | ORAL | 0 refills | Status: DC | PRN

## 2024-02-17 NOTE — TOC Transition Note (Signed)
 Transition of Care [facility]) - Discharge Note   Patient Details  Name: [name] MRN: [id number] Date of Birth: December 10, 1960  Transition of [facility]) CM/SW Contact:  Beather Arbour Phone Number: 02/17/2024, 4:08 PM   Clinical Narrative:    Patient is up for DC today . Family at bedside. Artavia with [facility] was updated on DC today and resumptions orders for PT/OT Aide. TOC signing off.    Final next level of care: Home w Home Health Services Barriers to Discharge: Barriers Resolved   Patient Goals and CMS Choice Patient states their goals for this hospitalization and ongoing recovery are:: DC home CMS Medicare.gov Compare Post Acute Care list provided to:: Patient Choice offered to / list presented to : Patient [place] ownership interest in [facility].provided to:: Patient    Discharge Placement                Patient to be transferred to facility by: POV Name of family member notified: Meesha and daughter Patient and family notified of of transfer: 02/17/24  Discharge Plan and Services Additional resources added to the After Visit Summary for       Post Acute Care Choice: Home Health                    HH Arranged: PT, OT, RN, Nurse's Aide HH Agency: [facility] Date HH Agency Contacted: 02/17/24 Time HH Agency Contacted: 1608 Representative spoke with at [facility] Agency: Adele Dan  Social Drivers of Health (SDOH) Interventions SDOH Screenings   Food Insecurity: No Food Insecurity (02/15/2024)  Housing: Low Risk  (02/15/2024)  Transportation Needs: No Transportation Needs (02/15/2024)  Utilities: Not At Risk (02/15/2024)  Alcohol Screen: Low Risk  (01/10/2024)  Depression (PHQ2-9): Medium Risk (01/10/2024)  Financial Resource Strain: Medium Risk (01/10/2024)  Physical Activity: Inactive (01/10/2024)  Social Connections: Moderately Integrated (02/15/2024)  Stress: No Stress Concern Present (01/10/2024)  Tobacco Use: High Risk (02/14/2024)   Health Literacy: Adequate Health Literacy (01/10/2024)     Readmission Risk Interventions    02/17/2024    4:06 PM 02/15/2024    7:08 PM  Readmission Risk Prevention Plan  Post Dischage Appt  Complete  Medication Screening  Complete  Transportation Screening Complete Complete  Home Care Screening Complete   Medication Review (RN CM) Complete

## 2024-02-17 NOTE — Progress Notes (Signed)
 Mobility Specialist Progress Note:    02/17/24 0900  Mobility  Activity Ambulated with assistance in hallway  Level of Assistance Contact guard assist, steadying assist  Assistive Device Cane  Distance Ambulated (ft) 20 ft  Range of Motion/Exercises Active;All extremities  Activity Response Tolerated well  Mobility Referral Yes  Mobility visit 1 Mobility  Mobility Specialist Start Time (ACUTE ONLY) 0900  Mobility Specialist Stop Time (ACUTE ONLY) 0920  Mobility Specialist Time Calculation (min) (ACUTE ONLY) 20 min   Pt received sitting EOB, agreeable to mobility. Required CGA to stand and ambulate with cane. Tolerated well, audible SOB. Returned pt sitting EOB, all needs met.  Leola Fiore Mobility Specialist Please contact via Special educational needs teacher or  Rehab office at [phone]

## 2024-02-17 NOTE — Discharge Summary (Signed)
 Physician Discharge Summary   Patient: [name] MRN: [id number] DOB: 07-Sep-1961  Admit date:     02/15/2024  Discharge date: 02/17/24  Discharge Physician: Baron Hamper    PCP: [name], DO     Discharge Diagnoses: Principal Problem:   Sepsis [facility]) Active Problems:   Acute pyelonephritis   Thrombocytopenia, unspecified (HCC)   Tobacco user   Coronary artery disease   GERD (gastroesophageal reflux disease)   OSA (obstructive sleep apnea)   Morbid (severe) obesity due to excess calories (HCC)   Diabetes (HCC)   Diabetic neuropathy (HCC)   Hypertension associated with diabetes (HCC)   Dyslipidemia due to type 2 diabetes mellitus (HCC)  Resolved Problems:   * No resolved hospital problems. *  Hospital Course: [name]  is a 63 y.o. female, with past medical history of CAD, tobacco abuse, hypertension, hyperlipidemia, diabetes mellitus, patient diagnosed by UTI by PCP, urine culture growing E. coli, pansensitive, she was treated with p.o. antibiotic as an outpatient, she kept having symptoms, seen by PCP again yesterday where she was ordered p.o. Cipro but she has not started yet, she kept having symptoms including dysuria, polyuria, abdominal pain and discomfort which prompted her to come to ED, she reports generalized weakness, poor fluid and food intake.  Pt was treated for e.coli UTI and pyelonephritis. Pt completed 3 days of IV ceftriaxone here in the hospital. Urine C&S showed sensitivity to PO cipro. Thus, pt will be discharged with renally reduced PO cipro for 7 more days (this will complete a 10 day course of antibx at home).  Blood cx remained negative.  DISCHARGE MEDICATION: Allergies as of 02/17/2024       Reactions   Farxiga [dapagliflozin] Other (See Comments)   Recurrent yeast infections   Morphine Nausea And Vomiting   Duloxetine Other (See Comments)   Unknown    Trulicity [dulaglutide] Other (See Comments)   GI adverse effects (okay on Rybelsus)         Medication List     STOP taking these medications    telmisartan 40 MG tablet Commonly known as: Micardis       TAKE these medications    acetaminophen 325 MG tablet Commonly known as: TYLENOL Take 975 mg by mouth every 8 (eight) hours as needed for mild pain (pain score 1-3).   albuterol 108 (90 Base) MCG/ACT inhaler Commonly known as: VENTOLIN HFA INHALE 2 PUFFS BY MOUTH EVERY 4 HOURS AS NEEDED FOR WHEEZING OR SHORTNESS OF BREATH (FOR RESCUE)   Alpha-Lipoic Acid 600 MG Caps Take 1 capsule (600 mg total) by mouth daily. For diabetic neuropathy   aspirin EC 325 MG tablet Take 325 mg by mouth daily.   Basaglar KwikPen 100 UNIT/ML Inject 45-50 Units into the skin at bedtime. May increase up to 50 units if instructed by your provider.   ciprofloxacin 250 MG tablet Commonly known as: CIPRO Take 1 tablet (250 mg total) by mouth 2 (two) times daily for 7 days.   FreeStyle Libre 3 Plus Sensor Misc Change sensor every 15 days. DX: E11.65   furosemide 40 MG tablet Commonly known as: LASIX TAKE 1 TABLET BY MOUTH ONCE DAILY AS NEEDED FOR EDEMA OR FLUID What changed:  how much to take how to take this when to take this   gabapentin 600 MG tablet Commonly known as: NEURONTIN TAKE 1 & 1/2 (ONE & ONE-HALF) TABLETS BY MOUTH THREE TIMES DAILY   Insulin Pen Needle 29G X Misc Use  to inject insulin daily as directed. DX:E11.65; please profile for future needs   metoprolol tartrate 25 MG tablet Commonly known as: LOPRESSOR Take 1 tablet (25 mg total) by mouth 2 (two) times daily.   nitroGLYCERIN 0.4 MG SL tablet Commonly known as: NITROSTAT Place 1 tablet (0.4 mg total) under the tongue every 5 (five) minutes x 3 doses as needed for chest pain (if no relief after 3rd dose, proceed to ED or call 911).   omeprazole 40 MG capsule Commonly known as: PRILOSEC Take 1 capsule (40 mg total) by mouth daily. For heartburn   OneTouch Delica Lancets 30G Misc Use to  test blood sugar twice daily as directed; DX: E11.69   OneTouch Verio test strip Generic drug: glucose blood Use to test blood sugar twice daily as directed; DX: E11.69   OneTouch Verio w/Device Kit Use to test blood sugar twice daily as directed; DX: E11.69   oxybutynin 5 MG 24 hr tablet Commonly known as: Ditropan XL Take 1 tablet (5 mg total) by mouth at bedtime. For bladder   oxyCODONE 5 MG immediate release tablet Commonly known as: Roxicodone Take 1 tablet (5 mg total) by mouth every 6 (six) hours as needed for up to 10 doses for severe pain (pain score 7-10).   rosuvastatin 20 MG tablet Commonly known as: CRESTOR Take 1 tablet (20 mg total) by mouth at bedtime.   Semaglutide 14 MG Tabs Take 14 mg by mouth daily.        Follow-up Information     [place] Follow up.   Why: RN/PT will call to schedule your next home visit. Contact information: [address] [place] [address] [phone]                Discharge Exam: Filed Weights   02/15/24 1149  Weight: 133.8 kg   Physical Exam HENT:     Head: Normocephalic.     Mouth/Throat:     Mouth: Mucous membranes are moist.  Cardiovascular:     Rate and Rhythm: Normal rate.  Pulmonary:     Effort: Pulmonary effort is normal.  Abdominal:     Palpations: Abdomen is soft.  Musculoskeletal:        General: Normal range of motion.     Cervical back: Neck supple.  Skin:    General: Skin is warm.  Neurological:     Mental Status: She is alert. Mental status is at baseline.  Psychiatric:        Mood and Affect: Mood normal.      Condition at discharge: fair  The results of significant diagnostics from this hospitalization (including imaging, microbiology, ancillary and laboratory) are listed below for reference.   Imaging Studies: CT ABDOMEN PELVIS WO CONTRAST Result Date: 02/15/2024 CLINICAL DATA:  Abdominal pain. EXAM: CT ABDOMEN AND PELVIS WITHOUT CONTRAST  TECHNIQUE: Multidetector CT imaging of the abdomen and pelvis was performed following the standard protocol without IV contrast. RADIATION DOSE REDUCTION: This exam was performed according to the departmental dose-optimization program which includes automated exposure control, adjustment of the mA and/or kV according to patient size and/or use of iterative reconstruction technique. COMPARISON:  CT abdomen pelvis dated 01/26/2024. FINDINGS: Evaluation of this exam is limited in the absence of intravenous contrast. Lower chest: The visualized lung bases are clear. There is coronary vascular calcification. No intra-abdominal free air or free fluid. Hepatobiliary: Slight irregularity of the liver contour may represent early changes of cirrhosis. Clinical correlation  is recommended. No biliary dilatation. Cholecystectomy. Pancreas: Unremarkable. No pancreatic ductal dilatation or surrounding inflammatory changes. Spleen: Normal in size without focal abnormality. Adrenals/Urinary Tract: The adrenal glands are unremarkable. There is no hydronephrosis or nephrolithiasis on either side. Small bilateral renal cysts. There is mild left perinephric haziness and stranding. Correlation with urinalysis recommended to exclude UTI. The visualized ureters appear unremarkable. The urinary bladder is mildly distended. There is apparent diffuse thickening of the bladder wall which may be partly related to underdistention. Cystitis is not excluded. Correlation with urinalysis recommended. Stomach/Bowel: There is no bowel obstruction or active inflammation. The appendix is not visualized with certainty. No inflammatory changes identified in the right lower quadrant. Vascular/Lymphatic: Moderate aortoiliac atherosclerotic disease. The IVC is unremarkable. No portal venous gas. There is no adenopathy. Reproductive: Hysterectomy.  No suspicious adnexal masses. Other: Ventral hernia repair mesh. There is a small fat containing hernia along  the left lateral aspect of the mesh. Musculoskeletal: No acute osseous pathology. IMPRESSION: 1. No hydronephrosis or nephrolithiasis. 2. Mild left perinephric haziness and stranding. Correlation with urinalysis recommended to exclude UTI. 3. No bowel obstruction. Electronically Signed   By: Elgie Collard M.D.   On: 02/15/2024 14:44   CT ABDOMEN PELVIS WO CONTRAST Result Date: 01/26/2024 CLINICAL DATA:  Abdominal pain.  Concern for pyelonephritis. EXAM: CT ABDOMEN AND PELVIS WITHOUT CONTRAST TECHNIQUE: Multidetector CT imaging of the abdomen and pelvis was performed following the standard protocol without IV contrast. RADIATION DOSE REDUCTION: This exam was performed according to the departmental dose-optimization program which includes automated exposure control, adjustment of the mA and/or kV according to patient size and/or use of iterative reconstruction technique. COMPARISON:  CT abdomen pelvis dated 11/19/2016. FINDINGS: Evaluation of this exam is limited in the absence of intravenous contrast. Lower chest: The visualized lung bases are clear. There is coronary vascular calcification. No intra-abdominal free air or free fluid. Hepatobiliary: The liver is unremarkable. No biliary ductal dilatation. Cholecystectomy. No retained calcified stone noted in the central CBD Pancreas: Unremarkable. No pancreatic ductal dilatation or surrounding inflammatory changes. Spleen: Normal in size without focal abnormality. Adrenals/Urinary Tract: The adrenal glands are unremarkable. There is no hydronephrosis or nephrolithiasis on either side. Small bilateral renal cysts suboptimally evaluated on this noncontrast CT but present on the prior CT. The visualized ureters and urinary bladder appear unremarkable. Stomach/Bowel: There is no bowel obstruction or active inflammation. The appendix is not visualized with certainty. No inflammatory changes identified in the right lower quadrant. Vascular/Lymphatic: Moderate  aortoiliac atherosclerotic disease. The IVC is unremarkable. No portal venous gas. There is no adenopathy. Reproductive: Hysterectomy.  No suspicious adnexal masses. Other: Ventral hernia repair mesh. Small fat containing hernia along the left lateral aspect of the mesh similar to prior CT. No bowel herniation or fluid collection. Musculoskeletal: Osteopenia. Grade 1 L4-L5 anterolisthesis. No acute osseous pathology. IMPRESSION: 1. No acute intra-abdominal or pelvic pathology. 2. No hydronephrosis or nephrolithiasis. Electronically Signed   By: Elgie Collard M.D.   On: 01/26/2024 15:48    Microbiology: Results for orders placed or performed during the hospital encounter of 02/15/24  Culture, blood (routine x 2)     Status: None (Preliminary result)   Collection Time: 02/15/24 12:18 PM   Specimen: BLOOD  Result Value Ref Range Status   Specimen Description BLOOD BLOOD RIGHT ARM  Final   Special Requests   Final    BOTTLES DRAWN AEROBIC AND ANAEROBIC Blood Culture adequate volume   Culture   Final    NO  GROWTH 2 DAYS Performed at [facility], [address], [place], [place] [address]    Report Status PENDING  Incomplete  Culture, blood (routine x 2)     Status: None (Preliminary result)   Collection Time: 02/15/24 12:18 PM   Specimen: BLOOD  Result Value Ref Range Status   Specimen Description BLOOD BLOOD LEFT ARM  Final   Special Requests   Final    BOTTLES DRAWN AEROBIC AND ANAEROBIC Blood Culture results may not be optimal due to an inadequate volume of blood received in culture bottles   Culture   Final    NO GROWTH 2 DAYS Performed at [facility], [address] [place], [place] [address]    Report Status PENDING  Incomplete  Resp panel by RT-PCR (RSV, Flu A&B, Covid) Anterior Nasal Swab     Status: None   Collection Time: 02/15/24  1:45 PM   Specimen: Anterior Nasal Swab  Result Value Ref Range Status   SARS Coronavirus 2 by RT PCR NEGATIVE NEGATIVE Final    Comment:  (NOTE) SARS-CoV-2 target nucleic acids are NOT DETECTED.  The SARS-CoV-2 RNA is generally detectable in upper respiratory specimens during the acute phase of infection. The lowest concentration of SARS-CoV-2 viral copies this assay can detect is 138 copies/mL. A negative result does not preclude SARS-Cov-2 infection and should not be used as the sole basis for treatment or other patient management decisions. A negative result may occur with  improper specimen collection/handling, submission of specimen other than nasopharyngeal swab, presence of viral mutation(s) within the areas targeted by this assay, and inadequate number of viral copies(<138 copies/mL). A negative result must be combined with clinical observations, patient history, and epidemiological information. The expected result is Negative.  Fact Sheet for Patients:  BloggerCourse.com  Fact Sheet for Healthcare Providers:  SeriousBroker.it  This test is no t yet approved or cleared by the [place] FDA and  has been authorized for detection and/or diagnosis of SARS-CoV-2 by FDA under an Emergency Use Authorization (EUA). This EUA will remain  in effect (meaning this test can be used) for the duration of the COVID-19 declaration under Section 564(b)(1) of the Act, 21 U.S.C.section 360bbb-3(b)(1), unless the authorization is terminated  or revoked sooner.       Influenza A by PCR NEGATIVE NEGATIVE Final   Influenza B by PCR NEGATIVE NEGATIVE Final    Comment: (NOTE) The Xpert Xpress SARS-CoV-2/FLU/RSV plus assay is intended as an aid in the diagnosis of influenza from Nasopharyngeal swab specimens and should not be used as a sole basis for treatment. Nasal washings and aspirates are unacceptable for Xpert Xpress SARS-CoV-2/FLU/RSV testing.  Fact Sheet for Patients: BloggerCourse.com  Fact Sheet for Healthcare  Providers: SeriousBroker.it  This test is not yet approved or cleared by the [place] FDA and has been authorized for detection and/or diagnosis of SARS-CoV-2 by FDA under an Emergency Use Authorization (EUA). This EUA will remain in effect (meaning this test can be used) for the duration of the COVID-19 declaration under Section 564(b)(1) of the Act, 21 U.S.C. section 360bbb-3(b)(1), unless the authorization is terminated or revoked.     Resp Syncytial Virus by PCR NEGATIVE NEGATIVE Final    Comment: (NOTE) Fact Sheet for Patients: BloggerCourse.com  Fact Sheet for Healthcare Providers: SeriousBroker.it  This test is not yet approved or cleared by the [place] FDA and has been authorized for detection and/or diagnosis of SARS-CoV-2 by FDA under an Emergency Use Authorization (EUA). This EUA  will remain in effect (meaning this test can be used) for the duration of the COVID-19 declaration under Section 564(b)(1) of the Act, 21 U.S.C. section 360bbb-3(b)(1), unless the authorization is terminated or revoked.  Performed at [facility], [address], [place], [place] [address]   Urine Culture     Status: Abnormal   Collection Time: 02/15/24  3:31 PM   Specimen: Urine, Clean Catch  Result Value Ref Range Status   Specimen Description   Final    URINE, CLEAN CATCH Performed at [facility], [address], [place], [place] [address]    Special Requests   Final    NONE Performed at [facility], [address], [place], [place] [address]    Culture >=100,000 COLONIES/mL ESCHERICHIA COLI (A)  Final   Report Status 02/17/2024 FINAL  Final   Organism ID, Bacteria ESCHERICHIA COLI (A)  Final      Susceptibility   Escherichia coli - MIC*    AMPICILLIN >=32 RESISTANT Resistant     CEFAZOLIN <=4 SENSITIVE Sensitive     CEFEPIME <=0.12 SENSITIVE Sensitive     CEFTRIAXONE <=0.25 SENSITIVE Sensitive      CIPROFLOXACIN <=0.25 SENSITIVE Sensitive     GENTAMICIN <=1 SENSITIVE Sensitive     IMIPENEM <=0.25 SENSITIVE Sensitive     NITROFURANTOIN <=16 SENSITIVE Sensitive     TRIMETH/SULFA <=20 SENSITIVE Sensitive     AMPICILLIN/SULBACTAM >=32 RESISTANT Resistant     PIP/TAZO <=4 SENSITIVE Sensitive ug/mL    * >=100,000 COLONIES/mL ESCHERICHIA COLI    Labs: CBC: Recent Labs  Lab 02/14/24 1522 02/15/24 1218 02/16/24 0412  WBC 8.1 7.0 5.5  NEUTROABS 4.9 4.2  --   HGB 12.9 12.1 11.0*  HCT 38.1 37.4 34.8*  MCV 94 94.4 95.1  PLT 75* 111* 107*   Basic Metabolic Panel: Recent Labs  Lab 02/14/24 1522 02/15/24 1215 02/15/24 1218 02/16/24 0412  NA 138  --  133* 139  K 3.4*  --  3.0* 3.7  CL 98  --  95* 107  CO2 21  --  23 24  GLUCOSE 91  --  100* 97  BUN 22  --  25* 20  CREATININE 2.15*  --  2.28* 1.92*  CALCIUM 8.9  --  8.6* 8.3*  MG  --  2.0  --   --   PHOS  --  2.9  --   --    Liver Function Tests: Recent Labs  Lab 02/14/24 1522 02/15/24 1218  AST 15 25  ALT 12 15  ALKPHOS 145* 134*  BILITOT 1.3* 1.4*  PROT 6.6 7.0  ALBUMIN 3.9 3.3*   CBG: Recent Labs  Lab 02/16/24 1116 02/16/24 1706 02/16/24 2050 02/17/24 0742 02/17/24 1111  GLUCAP 94 100* 97 88 146*    Discharge time spent: greater than 30 minutes.  Signed: Baron Hamper , MD [facility] 02/17/2024

## 2024-02-17 NOTE — Progress Notes (Signed)
 Progress Note   Patient: [name] UEA:[id number] DOB: 07/24/1961 DOA: 02/15/2024     2 DOS: the patient was seen and examined on 02/17/2024   Brief hospital course: [name]  is a 63 y.o. female, with past medical history of CAD, tobacco abuse, hypertension, hyperlipidemia, diabetes mellitus, patient diagnosed by UTI by PCP, urine culture growing E. coli, pansensitive, she was treated with p.o. antibiotic as an outpatient, she kept having symptoms, seen by PCP again yesterday where she was ordered p.o. Cipro but she has not started yet, she kept having symptoms including dysuria, polyuria, abdominal pain and discomfort which prompted her to come to ED, she reports generalized weakness, poor fluid and food intake.  ED: CT abdomen pelvis significant for left-sided pyelonephritis, UA significant for bacteriuria and pyuria, blood pressure soft initially responded to IV fluids, lactic acid mildly elevated but resolved with IV fluid, her creatinine up to 2.5, potassium low at 3.    Assessment and Plan: Sepsis secondary to e.coli UTI and acute pyelonephritis  - IV ceftriaxone 2 g daily  - Acidophilus 2 capsule PO tid  - Oxybutynin 5 mg PO daily at bedtime  - Pyridium 200 mg PO tid  - Norco 1 tab PO q4 hr PRN   Lactic acidosis - Resolved    HTN - Stable;monitor   CAD - ASA 325 mg PO daily  - Crestor 20 mg PO at bedtime  - Lasix 20 mg PO daily   Thrombocytopenia  - Monitor   DM2 - Novolog SS tid and at bedtime  - Semglee 25 units sq daily at bedtime  - Semaglutide 14 mg PO daily  - Gabapentin 900 mg PO tid   HLD - Crestor 20 mg PO at bedtime   AKI on CKD 3b - Monitor   Morbid obesity - BMI is 53.96 kg/m  Subjective: Pt seen and examined at the bedside. Pt reports not feeling quite yet ready for discharge today. She reports she is having some issues with stomach pain today and not eating her full meals. She continues on IV antibx with good result. Will re-attempt discharge  tmr.  Physical Exam: Vitals:   02/16/24 1021 02/16/24 1304 02/16/24 1624 02/16/24 2048  BP: 125/62 (!) 120/56 (!) 120/47 124/65  Pulse: 76 78 71 73  Resp:    19  Temp: 98.2 F (36.8 C) 97.9 F (36.6 C) 97.6 F (36.4 C) 97.9 F (36.6 C)  TempSrc: Oral Oral Oral Oral  SpO2: 93% 94% 92% 94%  Weight:      Height:       Physical Exam HENT:     Head: Normocephalic.     Mouth/Throat:     Mouth: Mucous membranes are moist.  Cardiovascular:     Rate and Rhythm: Normal rate and regular rhythm.  Pulmonary:     Effort: Pulmonary effort is normal.  Abdominal:     Palpations: Abdomen is soft.  Musculoskeletal:        General: Normal range of motion.     Cervical back: Neck supple.  Skin:    General: Skin is warm.  Neurological:     Mental Status: She is alert. Mental status is at baseline.  Psychiatric:        Mood and Affect: Mood normal.     Disposition: Status is: Inpatient Remains inpatient appropriate because: IV antibx  Planned Discharge Destination: Home    Time spent: 35 minutes  Author: Baron Hamper , MD 02/17/2024 11:22 AM  For  on call review [email or web address].

## 2024-02-17 NOTE — Plan of Care (Signed)
  Problem: Education: Goal: Knowledge of General Education information will improve Description: Including pain rating scale, medication(s)/side effects and non-pharmacologic comfort measures Outcome: Adequate for Discharge   Problem: Clinical Measurements: Goal: Ability to maintain clinical measurements within normal limits will improve Outcome: Adequate for Discharge Goal: Diagnostic test results will improve Outcome: Adequate for Discharge

## 2024-02-17 NOTE — Care Management Important Message (Signed)
 Important Message  Patient Details  Name: [name] MRN: [id number] Date of Birth: 18-Feb-1961   Important Message Given:  Yes - Medicare IM     Corey Harold 02/17/2024, 3:03 PM

## 2024-02-20 ENCOUNTER — Telehealth: Payer: Self-pay | Admitting: *Deleted

## 2024-02-20 DIAGNOSIS — Z794 Long term (current) use of insulin: Secondary | ICD-10-CM | POA: Diagnosis not present

## 2024-02-20 DIAGNOSIS — E1169 Type 2 diabetes mellitus with other specified complication: Secondary | ICD-10-CM | POA: Diagnosis not present

## 2024-02-20 DIAGNOSIS — F17210 Nicotine dependence, cigarettes, uncomplicated: Secondary | ICD-10-CM | POA: Diagnosis not present

## 2024-02-20 DIAGNOSIS — Z5982 Transportation insecurity: Secondary | ICD-10-CM | POA: Diagnosis not present

## 2024-02-20 DIAGNOSIS — G4733 Obstructive sleep apnea (adult) (pediatric): Secondary | ICD-10-CM | POA: Diagnosis not present

## 2024-02-20 DIAGNOSIS — I2510 Atherosclerotic heart disease of native coronary artery without angina pectoris: Secondary | ICD-10-CM | POA: Diagnosis not present

## 2024-02-20 DIAGNOSIS — Z7982 Long term (current) use of aspirin: Secondary | ICD-10-CM | POA: Diagnosis not present

## 2024-02-20 DIAGNOSIS — Z9181 History of falling: Secondary | ICD-10-CM | POA: Diagnosis not present

## 2024-02-20 DIAGNOSIS — K219 Gastro-esophageal reflux disease without esophagitis: Secondary | ICD-10-CM | POA: Diagnosis not present

## 2024-02-20 DIAGNOSIS — I890 Lymphedema, not elsewhere classified: Secondary | ICD-10-CM | POA: Diagnosis not present

## 2024-02-20 DIAGNOSIS — Z6841 Body Mass Index (BMI) 40.0 and over, adult: Secondary | ICD-10-CM | POA: Diagnosis not present

## 2024-02-20 DIAGNOSIS — E785 Hyperlipidemia, unspecified: Secondary | ICD-10-CM | POA: Diagnosis not present

## 2024-02-20 DIAGNOSIS — F32A Depression, unspecified: Secondary | ICD-10-CM | POA: Diagnosis not present

## 2024-02-20 LAB — CULTURE, BLOOD (ROUTINE X 2)
Culture: NO GROWTH
Culture: NO GROWTH
Special Requests: ADEQUATE

## 2024-02-20 NOTE — Transitions of Care (Post Inpatient/ED Visit) (Signed)
   02/20/2024  Name: [name] MRN: [id number] DOB: 1961/10/15  Today's TOC FU Call Status: Today's TOC FU Call Status:: Unsuccessful Call (1st Attempt) Unsuccessful Call (1st Attempt) Date: 02/20/24  Attempted to reach the patient regarding the most recent Inpatient/ED visit.  Follow Up Plan: Additional outreach attempts will be made to reach the patient to complete the Transitions of Care (Post Inpatient/ED visit) call.   Cecilie Coffee [facility], BSN RN Care Manager/ Transition of Care [place]/ [facility] [phone]

## 2024-02-21 ENCOUNTER — Other Ambulatory Visit: Payer: Self-pay | Admitting: Family Medicine

## 2024-02-21 ENCOUNTER — Telehealth: Payer: Self-pay | Admitting: *Deleted

## 2024-02-21 DIAGNOSIS — I890 Lymphedema, not elsewhere classified: Secondary | ICD-10-CM | POA: Diagnosis not present

## 2024-02-21 DIAGNOSIS — E1169 Type 2 diabetes mellitus with other specified complication: Secondary | ICD-10-CM | POA: Diagnosis not present

## 2024-02-21 DIAGNOSIS — Z794 Long term (current) use of insulin: Secondary | ICD-10-CM | POA: Diagnosis not present

## 2024-02-21 DIAGNOSIS — G4733 Obstructive sleep apnea (adult) (pediatric): Secondary | ICD-10-CM | POA: Diagnosis not present

## 2024-02-21 DIAGNOSIS — K219 Gastro-esophageal reflux disease without esophagitis: Secondary | ICD-10-CM | POA: Diagnosis not present

## 2024-02-21 DIAGNOSIS — Z9181 History of falling: Secondary | ICD-10-CM | POA: Diagnosis not present

## 2024-02-21 DIAGNOSIS — I2510 Atherosclerotic heart disease of native coronary artery without angina pectoris: Secondary | ICD-10-CM | POA: Diagnosis not present

## 2024-02-21 DIAGNOSIS — Z5982 Transportation insecurity: Secondary | ICD-10-CM | POA: Diagnosis not present

## 2024-02-21 DIAGNOSIS — Z6841 Body Mass Index (BMI) 40.0 and over, adult: Secondary | ICD-10-CM | POA: Diagnosis not present

## 2024-02-21 DIAGNOSIS — F17210 Nicotine dependence, cigarettes, uncomplicated: Secondary | ICD-10-CM | POA: Diagnosis not present

## 2024-02-21 DIAGNOSIS — Z7982 Long term (current) use of aspirin: Secondary | ICD-10-CM | POA: Diagnosis not present

## 2024-02-21 DIAGNOSIS — E1159 Type 2 diabetes mellitus with other circulatory complications: Secondary | ICD-10-CM

## 2024-02-21 DIAGNOSIS — E785 Hyperlipidemia, unspecified: Secondary | ICD-10-CM | POA: Diagnosis not present

## 2024-02-21 DIAGNOSIS — F32A Depression, unspecified: Secondary | ICD-10-CM | POA: Diagnosis not present

## 2024-02-21 NOTE — Transitions of Care (Post Inpatient/ED Visit) (Signed)
 02/21/2024  Name: [name] MRN: [id number] DOB: [birth year]  Today's TOC FU Call Status: Today's TOC FU Call Status:: Successful TOC FU Call Completed TOC FU Call Complete Date: 02/21/24 Patient's Name and Date of Birth confirmed.  Transition Care Management Follow-up Telephone Call Date of Discharge: 02/17/24 [facility] Type of Discharge: Inpatient Admission Primary Inpatient Discharge Diagnosis:: Sepsis How have you been since you were released from the hospital?: Better (still some burning and discomfort) Any questions or concerns?: No  Items Reviewed: Did you receive and understand the discharge instructions provided?: Yes Medications obtained,verified, and reconciled?: Yes (Medications Reviewed) Any new allergies since your discharge?: No Dietary orders reviewed?: No Do you have support at home?: Yes People in Home [RPT]: spouse Name of Support/Comfort Primary Source: [name]  Medications Reviewed Today: Medications Reviewed Today     Reviewed by Luella Cook, RN (Case Manager) on 02/21/24 at 1242  Med List Status: <None>   Medication Order Taking? Sig Documenting Provider Last Dose Status Informant  acetaminophen (TYLENOL) 325 MG tablet [id number] Yes Take 975 mg by mouth every 8 (eight) hours as needed for mild pain (pain score 1-3). [provider] Taking Active Self  albuterol (VENTOLIN HFA) 108 (90 Base) MCG/ACT inhaler [id number] Yes INHALE 2 PUFFS BY MOUTH EVERY 4 HOURS AS NEEDED FOR WHEEZING OR SHORTNESS OF BREATH (FOR RESCUE) [name], DO Taking Active Self  Alpha-Lipoic Acid 600 MG CAPS [id number] Yes Take 1 capsule (600 mg total) by mouth daily. For diabetic neuropathy [name], DO Taking Active Self  aspirin EC 325 MG tablet [id number] Yes Take 325 mg by mouth daily. [provider] Taking Active Self  Blood Glucose Monitoring Suppl [facility] VERIO) w/Device KIT [id number] Yes Use to test blood  sugar twice daily as directed; DX: E11.69 [name], DO Taking Active Self  ciprofloxacin (CIPRO) 250 MG tablet [id number] Yes Take 1 tablet (250 mg total) by mouth 2 (two) times daily for 7 days. Baron Hamper, MD Taking Active   Continuous Glucose Sensor (FREESTYLE LIBRE 3 PLUS SENSOR) [place] [id number] Yes Change sensor every 15 days. DX: E11.65 [name], DO Taking Active Self  furosemide (LASIX) 40 MG tablet [id number] Yes TAKE 1 TABLET BY MOUTH ONCE DAILY AS NEEDED FOR EDEMA OR FLUID  Patient taking differently: Take 20 mg by mouth daily. TAKE 1 TABLET BY MOUTH ONCE DAILY AS NEEDED FOR EDEMA OR FLUID   [name], DO Taking Active Self           Med Note (WARD, ANGELICA G   Wed Feb 15, 2024  5:27 PM)    gabapentin (NEURONTIN) 600 MG tablet [id number] Yes TAKE 1 & 1/2 (ONE & ONE-HALF) TABLETS BY MOUTH THREE TIMES DAILY Delynn Flavin M, DO Taking Active Self  glucose blood (ONETOUCH VERIO) test strip [id number] Yes Use to test blood sugar twice daily as directed; DX: E11.69 [name], DO Taking Active Self  Insulin Glargine (BASAGLAR KWIKPEN) 100 UNIT/ML [id number] Yes Inject 45-50 Units into the skin at bedtime. May increase up to 50 units if instructed by your provider. [name], DO Taking Active Self           Med Note (WARD, ANGELICA G   Wed Feb 15, 2024  5:26 PM) 45 last pm  Insulin Pen Needle 29G X MISC [id number] Yes Use to inject insulin daily as directed. DX:E11.65; please profile for future needs [name], DO Taking  Active Self  metoprolol tartrate (LOPRESSOR) 25 MG tablet [id number] Yes Take 1 tablet (25 mg total) by mouth 2 (two) times daily. Lasalle Pointer, NP Taking Active Self  nitroGLYCERIN (NITROSTAT) 0.4 MG SL tablet [id number] Yes Place 1 tablet (0.4 mg total) under the tongue every 5 (five) minutes x 3 doses as needed for chest pain (if no relief after 3rd dose, proceed to ED or call 911). Lasalle Pointer, NP Taking  Active Self  omeprazole (PRILOSEC) 40 MG capsule [id number] Yes Take 1 capsule (40 mg total) by mouth daily. For heartburn Eliodoro Guerin, DO Taking Active Self  OneTouch Delica Lancets 30G MISC [id number] Yes Use to test blood sugar twice daily as directed; DX: E11.69 Eliodoro Guerin, DO Taking Active Self  oxybutynin (DITROPAN XL) 5 MG 24 hr tablet [id number]  Take 1 tablet (5 mg total) by mouth at bedtime. For bladder Eliodoro Guerin, DO  Active Self  oxyCODONE (ROXICODONE) 5 MG immediate release tablet [id number] Yes Take 1 tablet (5 mg total) by mouth every 6 (six) hours as needed for up to 10 doses for severe pain (pain score 7-10). Sira, Zackery, MD Taking Active   rosuvastatin (CRESTOR) 20 MG tablet [id number] Yes Take 1 tablet (20 mg total) by mouth at bedtime. Eliodoro Guerin, DO Taking Active Self  Semaglutide 14 MG TABS [id number] Yes Take 14 mg by mouth daily. [provider] Taking Active Self           Med Note Alpheus Arvin, Donata Fryer   Tue Sep 22, 2021  3:08 PM) VIA NOVO [facility] PATIENT ASSISTANCE PROGRAM            Home Care and Equipment/Supplies: Were Home Health Services Ordered?: Yes Name of Home Health Agency:: [facility] Has Agency set up a time to come to your home?: No EMR reviewed for Home Health Orders: Orders present/patient has not received call (refer to CM for follow-up) (PER PATIENT THEY ARE SUPPOSE TO COME  FRIDAY) Any new equipment or medical supplies ordered?: NA  Functional Questionnaire: Do you need assistance with bathing/showering or dressing?: No Do you need assistance with meal preparation?: No Do you need assistance with eating?: No Do you have difficulty maintaining continence: No Do you need assistance with getting out of bed/getting out of a chair/moving?: No Do you have difficulty managing or taking your medications?: No  Follow up appointments reviewed: PCP Follow-up appointment confirmed?: Yes Date of PCP follow-up  appointment?: 03/02/24 ([facility] scheduled appointment) Follow-up Provider: Dr Odilia Bennett [facility] Follow-up appointment confirmed?: NA Do you need transportation to your follow-up appointment?: No Do you understand care options if your condition(s) worsen?: Yes-patient verbalized understanding  SDOH Interventions Today    Flowsheet Row Most Recent Value  SDOH Interventions   Food Insecurity Interventions Intervention Not Indicated  Housing Interventions Intervention Not Indicated  Transportation Interventions Intervention Not Indicated, Patient Resources (Friends/Family)  Utilities Interventions Intervention Not Indicated       Una Ganser BSN RN [facility] Care Management Coordinator [email or web address] Direct Dial: [phone]  Fax: [phone] Website: [email or web address]

## 2024-02-22 ENCOUNTER — Telehealth: Payer: Self-pay | Admitting: *Deleted

## 2024-02-22 ENCOUNTER — Other Ambulatory Visit

## 2024-02-22 DIAGNOSIS — R899 Unspecified abnormal finding in specimens from other organs, systems and tissues: Secondary | ICD-10-CM

## 2024-02-22 DIAGNOSIS — N184 Chronic kidney disease, stage 4 (severe): Secondary | ICD-10-CM

## 2024-02-22 DIAGNOSIS — R6881 Early satiety: Secondary | ICD-10-CM

## 2024-02-22 NOTE — Telephone Encounter (Signed)
 TC from [place] w/ [facility] Wanting to know about getting referral to Nephrology / GI Pt just came out of hospital and is not eating or wanting to eat HFU made for 02/29/24

## 2024-02-22 NOTE — Telephone Encounter (Signed)
 Home health nurse informed. LS

## 2024-02-22 NOTE — Addendum Note (Signed)
 Addended by: Eliodoro Guerin on: 02/22/2024 03:12 PM   Modules accepted: Orders

## 2024-02-22 NOTE — Telephone Encounter (Signed)
 Orders placed  Orders Placed This Encounter  Procedures   Ambulatory referral to Nephrology   Ambulatory referral to Gastroenterology

## 2024-02-23 ENCOUNTER — Encounter: Payer: Self-pay | Admitting: *Deleted

## 2024-02-23 LAB — BMP8+EGFR
BUN/Creatinine Ratio: 9 — ABNORMAL LOW (ref 12–28)
BUN: 16 mg/dL (ref 8–27)
CO2: 19 mmol/L — ABNORMAL LOW (ref 20–29)
Calcium: 9.1 mg/dL (ref 8.7–10.3)
Chloride: 101 mmol/L (ref 96–106)
Creatinine, Ser: 1.74 mg/dL — ABNORMAL HIGH (ref 0.57–1.00)
Glucose: 93 mg/dL (ref 70–99)
Potassium: 4.2 mmol/L (ref 3.5–5.2)
Sodium: 140 mmol/L (ref 134–144)
eGFR: 32 mL/min/{1.73_m2} — ABNORMAL LOW (ref >59)

## 2024-02-23 NOTE — Progress Notes (Signed)
 Will share with PCP, renal function stable. Has hospital follow up scheduled for 4/23. Please keep appt/follow up sooner if experiencing worsening symptoms.

## 2024-02-24 ENCOUNTER — Other Ambulatory Visit: Payer: Self-pay | Admitting: *Deleted

## 2024-02-24 ENCOUNTER — Encounter: Payer: Self-pay | Admitting: *Deleted

## 2024-02-24 DIAGNOSIS — Z794 Long term (current) use of insulin: Secondary | ICD-10-CM | POA: Diagnosis not present

## 2024-02-24 DIAGNOSIS — E1142 Type 2 diabetes mellitus with diabetic polyneuropathy: Secondary | ICD-10-CM | POA: Diagnosis not present

## 2024-02-24 DIAGNOSIS — Z6841 Body Mass Index (BMI) 40.0 and over, adult: Secondary | ICD-10-CM | POA: Diagnosis not present

## 2024-02-24 DIAGNOSIS — E1169 Type 2 diabetes mellitus with other specified complication: Secondary | ICD-10-CM | POA: Diagnosis not present

## 2024-02-24 DIAGNOSIS — J449 Chronic obstructive pulmonary disease, unspecified: Secondary | ICD-10-CM | POA: Diagnosis not present

## 2024-02-24 DIAGNOSIS — I159 Secondary hypertension, unspecified: Secondary | ICD-10-CM | POA: Diagnosis not present

## 2024-02-24 DIAGNOSIS — I2510 Atherosclerotic heart disease of native coronary artery without angina pectoris: Secondary | ICD-10-CM | POA: Diagnosis not present

## 2024-02-24 DIAGNOSIS — E785 Hyperlipidemia, unspecified: Secondary | ICD-10-CM | POA: Diagnosis not present

## 2024-02-24 DIAGNOSIS — E1122 Type 2 diabetes mellitus with diabetic chronic kidney disease: Secondary | ICD-10-CM | POA: Diagnosis not present

## 2024-02-24 DIAGNOSIS — E1159 Type 2 diabetes mellitus with other circulatory complications: Secondary | ICD-10-CM | POA: Diagnosis not present

## 2024-02-24 DIAGNOSIS — N183 Chronic kidney disease, stage 3 unspecified: Secondary | ICD-10-CM | POA: Diagnosis not present

## 2024-02-24 DIAGNOSIS — I890 Lymphedema, not elsewhere classified: Secondary | ICD-10-CM | POA: Diagnosis not present

## 2024-02-28 ENCOUNTER — Encounter: Payer: Self-pay | Admitting: Family Medicine

## 2024-02-28 ENCOUNTER — Ambulatory Visit

## 2024-02-28 VITALS — BP 103/57 | HR 67 | Temp 98.7°F | Ht 62.0 in | Wt 290.0 lb

## 2024-02-28 DIAGNOSIS — I890 Lymphedema, not elsewhere classified: Secondary | ICD-10-CM

## 2024-02-28 DIAGNOSIS — R300 Dysuria: Secondary | ICD-10-CM | POA: Diagnosis not present

## 2024-02-28 DIAGNOSIS — N1832 Chronic kidney disease, stage 3b: Secondary | ICD-10-CM

## 2024-02-28 DIAGNOSIS — Z09 Encounter for follow-up examination after completed treatment for conditions other than malignant neoplasm: Secondary | ICD-10-CM

## 2024-02-28 DIAGNOSIS — K921 Melena: Secondary | ICD-10-CM

## 2024-02-28 DIAGNOSIS — R1030 Lower abdominal pain, unspecified: Secondary | ICD-10-CM | POA: Diagnosis not present

## 2024-02-28 DIAGNOSIS — N390 Urinary tract infection, site not specified: Secondary | ICD-10-CM | POA: Diagnosis not present

## 2024-02-28 DIAGNOSIS — A419 Sepsis, unspecified organism: Secondary | ICD-10-CM

## 2024-02-28 DIAGNOSIS — N179 Acute kidney failure, unspecified: Secondary | ICD-10-CM | POA: Diagnosis not present

## 2024-02-28 LAB — URINALYSIS, ROUTINE W REFLEX MICROSCOPIC
Bilirubin, UA: NEGATIVE
Glucose, UA: NEGATIVE
Ketones, UA: NEGATIVE
Leukocytes,UA: NEGATIVE
Nitrite, UA: NEGATIVE
Protein,UA: NEGATIVE
RBC, UA: NEGATIVE
Specific Gravity, UA: <1.005 — ABNORMAL LOW (ref 1.005–1.030)
Urobilinogen, Ur: 0.2 mg/dL (ref 0.2–1.0)
pH, UA: 5.5 (ref 5.0–7.5)

## 2024-02-28 LAB — MICROSCOPIC EXAMINATION
Bacteria, UA: NONE SEEN
RBC, Urine: NONE SEEN /HPF (ref 0–2)
Renal Epithel, UA: NONE SEEN /HPF
Yeast, UA: NONE SEEN

## 2024-02-28 MED ORDER — DOCUSATE SODIUM 100 MG PO CAPS: 100.0000 mg | ORAL_CAPSULE | Freq: Two times a day (BID) | ORAL | 0 refills | Status: AC | PRN

## 2024-02-28 NOTE — Progress Notes (Addendum)
 Subjective: CC: Hospital follow-up for sepsis PCP: Eliodoro Guerin, DO ZDG:LOVFI [name] is a 63 y.o. female presenting to clinic today for:  1.  Sepsis Patient was hospitalized for sepsis.  She was treated for E. coli UTI and pyelonephritis and completed 3 days of IV ceftriaxone  and was discharged on renally dosed Cipro  for 7 additional days.  At discharge it was advised for her to hold her Micardis  40 mg and get BMP collected.  She had BMP on 02/22/2024 and this showed GFR that had improved slightly compared to discharge at 32.  Referrals were placed to nephrology and gastroenterology.  She has an appoint with gastroenterology next week.  She will see urology on 04/03/2024.  She has not yet gotten an appointment with nephrology but will be glad to make one if we can give her the information today.  She reports no fever since discharge but she has had persistent lower abdominal pain, pelvic pressure and some mild urethral irritation when she is just sitting.  She reports having a past the very large blood clot in her stool after having strained.  She reports no bowel movement since that time.  She reports early satiety and abdominal cramping after food.  She subsequently has been avoiding food.   ROS: Per HPI  Allergies  Allergen Reactions   Farxiga  [Dapagliflozin ] Other (See Comments)    Recurrent yeast infections   Morphine  Nausea And Vomiting   Duloxetine  Other (See Comments)    Unknown    Trulicity  [Dulaglutide ] Other (See Comments)    GI adverse effects (okay on Rybelsus )   Past Medical History:  Diagnosis Date   Arthritis    CAD (coronary artery disease)    BMS to circumflex 2007 - Dr. Grandville Lax   CKD (chronic kidney disease) stage 3, GFR 30-59 ml/min (HCC)    COPD (chronic obstructive pulmonary disease) (HCC)    Depression    Dyspnea    Essential hypertension    Falls frequently    GERD (gastroesophageal reflux disease)    HA (headache)    Hyperlipidemia     Hyperlipidemia associated with type 2 diabetes mellitus (HCC) 02/03/2011   Left-sided face pain    Morbid obesity (HCC)    Noncompliance    OSA (obstructive sleep apnea)    Uses CPAP   Type 2 diabetes mellitus (HCC)    Urinary incontinence    Urinary incontinence     Current Outpatient Medications:    acetaminophen  (TYLENOL ) 325 MG tablet, Take 975 mg by mouth every 8 (eight) hours as needed for mild pain (pain score 1-3)., Disp: , Rfl:    albuterol  (VENTOLIN  HFA) 108 (90 Base) MCG/ACT inhaler, INHALE 2 PUFFS BY MOUTH EVERY 4 HOURS AS NEEDED FOR WHEEZING OR SHORTNESS OF BREATH (FOR RESCUE), Disp: 9 g, Rfl: 1   Alpha-Lipoic Acid 600 MG CAPS, Take 1 capsule (600 mg total) by mouth daily. For diabetic neuropathy, Disp: 100 capsule, Rfl: 3   aspirin  EC 325 MG tablet, Take 325 mg by mouth daily., Disp: , Rfl:    Blood Glucose Monitoring Suppl (ONETOUCH VERIO) w/Device KIT, Use to test blood sugar twice daily as directed; DX: E11.69, Disp: 1 kit, Rfl: 1   Continuous Glucose Sensor (FREESTYLE LIBRE 3 PLUS SENSOR) MISC, Change sensor every 15 days. DX: E11.65, Disp: 2 each, Rfl: 11   furosemide  (LASIX ) 40 MG tablet, TAKE 1 TABLET BY MOUTH ONCE DAILY AS NEEDED FOR EDEMA OR FLUID, Disp: 90 tablet, Rfl: 0  gabapentin  (NEURONTIN ) 600 MG tablet, TAKE 1 & 1/2 (ONE & ONE-HALF) TABLETS BY MOUTH THREE TIMES DAILY, Disp: 270 tablet, Rfl: 0   glucose blood (ONETOUCH VERIO) test strip, Use to test blood sugar twice daily as directed; DX: E11.69, Disp: 100 each, Rfl: 5   Insulin  Glargine (BASAGLAR  KWIKPEN) 100 UNIT/ML, Inject 45-50 Units into the skin at bedtime. May increase up to 50 units if instructed by your provider., Disp: 45 mL, Rfl: 5   Insulin  Pen Needle 29G X MISC, Use to inject insulin  daily as directed. DX:E11.65; please profile for future needs, Disp: 300 each, Rfl: 5   metoprolol  tartrate (LOPRESSOR ) 25 MG tablet, Take 1 tablet (25 mg total) by mouth 2 (two) times daily., Disp: 180 tablet, Rfl:  2   nitroGLYCERIN  (NITROSTAT ) 0.4 MG SL tablet, Place 1 tablet (0.4 mg total) under the tongue every 5 (five) minutes x 3 doses as needed for chest pain (if no relief after 3rd dose, proceed to ED or call 911)., Disp: 25 tablet, Rfl: 3   omeprazole  (PRILOSEC) 40 MG capsule, Take 1 capsule (40 mg total) by mouth daily. For heartburn, Disp: 90 capsule, Rfl: 3   OneTouch Delica Lancets 30G MISC, Use to test blood sugar twice daily as directed; DX: E11.69, Disp: 100 each, Rfl: 3   oxybutynin  (DITROPAN  XL) 5 MG 24 hr tablet, Take 1 tablet (5 mg total) by mouth at bedtime. For bladder, Disp: 90 tablet, Rfl: 3   oxyCODONE  (ROXICODONE ) 5 MG immediate release tablet, Take 1 tablet (5 mg total) by mouth every 6 (six) hours as needed for up to 10 doses for severe pain (pain score 7-10)., Disp: 10 tablet, Rfl: 0   rosuvastatin  (CRESTOR ) 20 MG tablet, Take 1 tablet (20 mg total) by mouth at bedtime., Disp: 90 tablet, Rfl: 3   Semaglutide  14 MG TABS, Take 14 mg by mouth daily., Disp: , Rfl:  Social History   Socioeconomic History   Marital status: Married    Spouse name: Sammie Crigler   Number of children: 5   Years of education: 10 th   Highest education level: 10th grade  Occupational History   Occupation: disabled     Comment: disabled  Tobacco Use   Smoking status: Every Day    Current packs/day: 1.00    Average packs/day: 1 pack/day for 51.8 years (51.8 ttl pk-yrs)    Types: Cigarettes    Start date: 05/14/1972    Passive exposure: Current   Smokeless tobacco: Never   Tobacco comments:    Smoking Cessation Classes, Services, Agencies & Resources Offered.  Vaping Use   Vaping status: Never Used  Substance and Sexual Activity   Alcohol  use: No    Alcohol /week: 0.0 standard drinks of alcohol    Drug use: No   Sexual activity: Not Currently    Partners: Male    Birth control/protection: Surgical  Other Topics Concern   Not on file  Social History Narrative   Patient lives at home with her husband.  Patient is disabled.   Children live nearby   Patient has 10 th grade education.   Right handed.   Caffeine- one  cup of coffee and  One soda Dr.Pepper/ tea. daily   Social Drivers of Health   Financial Resource Strain: Medium Risk (01/10/2024)   Overall Financial Resource Strain (CARDIA)    Difficulty of Paying Living Expenses: Somewhat hard  Food Insecurity: No Food Insecurity (02/21/2024)   Hunger Vital Sign    Worried About Running  Out of Food in the Last Year: Never true    Ran Out of Food in the Last Year: Never true  Transportation Needs: No Transportation Needs (02/21/2024)   PRAPARE - Administrator, Civil Service (Medical): No    Lack of Transportation (Non-Medical): No  Physical Activity: Inactive (01/10/2024)   Exercise Vital Sign    Days of Exercise per Week: 0 days    Minutes of Exercise per Session: 0 min  Stress: No Stress Concern Present (01/10/2024)   [employer] of Occupational Health - Occupational Stress Questionnaire    Feeling of Stress : Not at all  Social Connections: Moderately Integrated (02/15/2024)   Social Connection and Isolation Panel [NHANES]    Frequency of Communication with Friends and Family: More than three times a week    Frequency of Social Gatherings with Friends and Family: Three times a week    Attends Religious Services: More than 4 times per year    Active Member of Clubs or Organizations: No    Attends Banker Meetings: Never    Marital Status: Married  Catering manager Violence: Not At Risk (02/21/2024)   Humiliation, Afraid, Rape, and Kick questionnaire    Fear of Current or Ex-Partner: No    Emotionally Abused: No    Physically Abused: No    Sexually Abused: No   Family History  Problem Relation Age of Onset   Breast cancer Mother    Cancer Father    Coronary artery disease Father    Cancer - Colon Father    Diabetes Father    High Cholesterol Father    Arthritis Sister    Asthma Sister    High  Cholesterol Daughter    Thyroid  disease Daughter    Hiatal hernia Son    Congenital heart disease Son     Objective: Office vital signs reviewed. BP (!) 103/57   Pulse 67   Temp 98.7 F (37.1 C)   Ht 5\' 2"  (1.575 m)   Wt 290 lb (131.5 kg)   SpO2 98%   BMI 53.04 kg/m   Physical Examination:  General: Awake, alert, nontoxic elderly female, No acute distress HEENT: Sclera white.  Moist mucous membranes Cardio: regular rate and rhythm, S1S2 heard, no murmurs appreciated Pulm: clear to auscultation bilaterally, no wheezes, rhonchi or rales; normal work of breathing on room air GI: soft, non-tender, non-distended, bowel sounds present x4, no hepatomegaly, no splenomegaly, no masses GU: Suprapubic tenderness to palpation present.  No CVA tenderness palpation.  Assessment/ Plan: 63 y.o. female   Sepsis secondary to UTI [facility]) - Plan: Renal Function Panel, CBC, Ambulatory referral to [facility] discharge follow-up - Plan: Renal Function Panel, CBC, Ambulatory referral to Home Health  AKI (acute kidney injury) (HCC) - Plan: Renal Function Panel, CBC, Ambulatory referral to Home Health  Stage 3b chronic kidney disease (HCC) - Plan: Renal Function Panel, CBC, Ambulatory referral to Home Health  Dysuria - Plan: Urinalysis, Routine w reflex microscopic, Urine Culture  Hematochezia  Lower abdominal pain - Plan: docusate sodium  (COLACE) 100 MG capsule  Lymphedema - Plan: Ambulatory referral to Home Health  Urinalysis without evidence of persistent UTI and there was no evidence of yeast.  I do question if the lower abdominal pain may be in fact related to the hematochezia that she had.  I also have concerns that there may be some type of mesenteric ischemia going on given reports of postprandial abdominal pain.  However, CT  scan with contrast would be limited due to advanced CKD that is borderline 3B, 4.  I will reach out to the gastroenterologist as FYI in anticipation of  upcoming appointment and see if perhaps they might have any thoughts as to how we might further explore her symptoms.  She was given information for Dr. Carrolyn Clan to make an appointment.  We will check CBC, BMP.  Colace given for softening of stool.  Continue hydration  HH to resume care for PT/ leg wraps  Orders Placed This Encounter  Procedures   Urine Culture   Renal Function Panel   CBC   Urinalysis, Routine w reflex microscopic   No orders of the defined types were placed in this encounter.   Today's visit is for Transitional Care Management.  The patient was discharged from [facility] on 02/17/2024 with a primary diagnosis of Sepsis.   Contact with the patient and/or caregiver, by a clinical staff member, was made on 02/21/2024 and was documented as a telephone encounter within the EMR.  Through chart review and discussion with the patient I have determined that management of their condition is of moderate complexity.    Eliodoro Guerin, DO [facility] [phone]

## 2024-02-29 ENCOUNTER — Inpatient Hospital Stay: Admitting: Family Medicine

## 2024-02-29 DIAGNOSIS — Z6841 Body Mass Index (BMI) 40.0 and over, adult: Secondary | ICD-10-CM | POA: Diagnosis not present

## 2024-02-29 DIAGNOSIS — I890 Lymphedema, not elsewhere classified: Secondary | ICD-10-CM | POA: Diagnosis not present

## 2024-02-29 DIAGNOSIS — E1159 Type 2 diabetes mellitus with other circulatory complications: Secondary | ICD-10-CM | POA: Diagnosis not present

## 2024-02-29 DIAGNOSIS — I159 Secondary hypertension, unspecified: Secondary | ICD-10-CM | POA: Diagnosis not present

## 2024-02-29 DIAGNOSIS — J449 Chronic obstructive pulmonary disease, unspecified: Secondary | ICD-10-CM | POA: Diagnosis not present

## 2024-02-29 DIAGNOSIS — E1122 Type 2 diabetes mellitus with diabetic chronic kidney disease: Secondary | ICD-10-CM | POA: Diagnosis not present

## 2024-02-29 DIAGNOSIS — N183 Chronic kidney disease, stage 3 unspecified: Secondary | ICD-10-CM | POA: Diagnosis not present

## 2024-02-29 DIAGNOSIS — E1142 Type 2 diabetes mellitus with diabetic polyneuropathy: Secondary | ICD-10-CM | POA: Diagnosis not present

## 2024-02-29 DIAGNOSIS — E1169 Type 2 diabetes mellitus with other specified complication: Secondary | ICD-10-CM | POA: Diagnosis not present

## 2024-02-29 DIAGNOSIS — E785 Hyperlipidemia, unspecified: Secondary | ICD-10-CM | POA: Diagnosis not present

## 2024-02-29 DIAGNOSIS — A4151 Sepsis due to Escherichia coli [E. coli]: Secondary | ICD-10-CM | POA: Diagnosis not present

## 2024-02-29 DIAGNOSIS — Z794 Long term (current) use of insulin: Secondary | ICD-10-CM | POA: Diagnosis not present

## 2024-02-29 LAB — RENAL FUNCTION PANEL
Albumin: 4 g/dL (ref 3.9–4.9)
BUN/Creatinine Ratio: 8 — ABNORMAL LOW (ref 12–28)
BUN: 11 mg/dL (ref 8–27)
CO2: 25 mmol/L (ref 20–29)
Calcium: 9.5 mg/dL (ref 8.7–10.3)
Chloride: 103 mmol/L (ref 96–106)
Creatinine, Ser: 1.32 mg/dL — ABNORMAL HIGH (ref 0.57–1.00)
Glucose: 64 mg/dL — ABNORMAL LOW (ref 70–99)
Phosphorus: 3.2 mg/dL (ref 3.0–4.3)
Potassium: 3.6 mmol/L (ref 3.5–5.2)
Sodium: 141 mmol/L (ref 134–144)
eGFR: 45 mL/min/{1.73_m2} — ABNORMAL LOW (ref >59)

## 2024-02-29 LAB — CBC
Hematocrit: 40.3 % (ref 34.0–46.6)
Hemoglobin: 13.2 g/dL (ref 11.1–15.9)
MCH: 31.3 pg (ref 26.6–33.0)
MCHC: 32.8 g/dL (ref 31.5–35.7)
MCV: 96 fL (ref 79–97)
Platelets: 167 10*3/uL (ref 150–450)
RBC: 4.22 x10E6/uL (ref 3.77–5.28)
RDW: 15.3 % (ref 11.7–15.4)
WBC: 7.7 10*3/uL (ref 3.4–10.8)

## 2024-02-29 LAB — URINE CULTURE

## 2024-03-02 ENCOUNTER — Inpatient Hospital Stay: Admitting: Nurse Practitioner

## 2024-03-02 ENCOUNTER — Ambulatory Visit: Payer: Self-pay

## 2024-03-02 DIAGNOSIS — F17210 Nicotine dependence, cigarettes, uncomplicated: Secondary | ICD-10-CM | POA: Diagnosis not present

## 2024-03-02 DIAGNOSIS — K219 Gastro-esophageal reflux disease without esophagitis: Secondary | ICD-10-CM | POA: Diagnosis not present

## 2024-03-02 DIAGNOSIS — Z1211 Encounter for screening for malignant neoplasm of colon: Secondary | ICD-10-CM | POA: Diagnosis not present

## 2024-03-02 DIAGNOSIS — E785 Hyperlipidemia, unspecified: Secondary | ICD-10-CM | POA: Diagnosis not present

## 2024-03-02 DIAGNOSIS — I252 Old myocardial infarction: Secondary | ICD-10-CM | POA: Diagnosis not present

## 2024-03-02 DIAGNOSIS — I130 Hypertensive heart and chronic kidney disease with heart failure and stage 1 through stage 4 chronic kidney disease, or unspecified chronic kidney disease: Secondary | ICD-10-CM | POA: Diagnosis not present

## 2024-03-02 DIAGNOSIS — I25119 Atherosclerotic heart disease of native coronary artery with unspecified angina pectoris: Secondary | ICD-10-CM | POA: Diagnosis not present

## 2024-03-02 DIAGNOSIS — J42 Unspecified chronic bronchitis: Secondary | ICD-10-CM | POA: Diagnosis not present

## 2024-03-02 DIAGNOSIS — I509 Heart failure, unspecified: Secondary | ICD-10-CM | POA: Diagnosis not present

## 2024-03-02 DIAGNOSIS — N1831 Chronic kidney disease, stage 3a: Secondary | ICD-10-CM | POA: Diagnosis not present

## 2024-03-02 DIAGNOSIS — Z6841 Body Mass Index (BMI) 40.0 and over, adult: Secondary | ICD-10-CM | POA: Diagnosis not present

## 2024-03-02 DIAGNOSIS — Z008 Encounter for other general examination: Secondary | ICD-10-CM | POA: Diagnosis not present

## 2024-03-02 NOTE — Telephone Encounter (Signed)
 Info only: Pt calling wanting to know if medication Telmisrean 40mg  needs to be restarted. It was discontinued after hospital stay. Insurance called pt to tell she "needed to be on this medication and for MD to reorder it." Pt is also needing guidance on leg wrapping. HH came out and told pt "you are out of visits. I can't wrap your legs anymore with orders." Pt needs to know if pcp office will wrap or if HH orders can be done. Please advise.          Copied from CRM [id number]. Topic: Clinical - Medication Question >> Mar 02, 2024  4:23 PM [place] B wrote: Reason for CRM: pt called , says hospital took her off of the med,  telmistrean 40 mg but she wants to know if she can get back on this med. She also asked when she should be getting her legs wrapped again. Reason for Disposition  Health Information question, no triage required and triager able to answer question  Answer Assessment - Initial Assessment Questions 1. REASON FOR CALL or QUESTION: "What is your reason for calling today?" or "How can I best help you?" or "What question do you have that I can help answer?"     Telmistrean 40mg  was stopped after hospital stay and pt got insurance call today saying she needed to keep taking it. Pt also needs to orders for insurance to  pay for [facility] to come out to wrap legs.  Protocols used: Information Only Call - No Triage-A-AH

## 2024-03-05 ENCOUNTER — Telehealth: Payer: Self-pay

## 2024-03-05 DIAGNOSIS — Z1211 Encounter for screening for malignant neoplasm of colon: Secondary | ICD-10-CM

## 2024-03-05 DIAGNOSIS — Z1231 Encounter for screening mammogram for malignant neoplasm of breast: Secondary | ICD-10-CM

## 2024-03-05 NOTE — Telephone Encounter (Signed)
 Called and left vm for update on colonoscopy & mammogram   Please update if pt calls back

## 2024-03-05 NOTE — Progress Notes (Unsigned)
 GI Office Note    Referring Provider: Eliodoro Guerin, DO Primary Care Physician:  Eliodoro Guerin, DO  Primary Gastroenterologist: Muhammad Faizan Ahmed, MD (previously Dr. Homero Luster)  Chief Complaint   No chief complaint on file.  History of Present Illness   [name] is a 63 y.o. female presenting today at the request of Vicky Grange M, DO for early satiety  Colonoscopy June 2014: - Lipomatous ileocecal valve, palpated with forceps - Normal colon and rectal mucosa - Small hemorrhoids below the dentate line - Advised Anusol  for 2 weeks, avoid naproxen  - Colonoscopy in 5 years recommended  Per review of chart, seen PCP 02/28/2024.  She was seen for hospital follow-up given recent sepsis.  She had had a recent E. coli UTI with pyelonephritis and was treated with IV antibiotics.  She reported persistent lower abdominal pain, pelvic fracture, and urethral irritation with sitting.  Stated in the past she passed a very large blood clot in her stool after straining.  No bowel movement since then.  Also reporting early satiety abdominal cramping after eating therefore she has been avoiding food.  Given stool softener for constipation and advised hydration.  Given information to follow-up with Dr. Carrolyn Clan with nephrology.  Reported some concerns about possible mesenteric ischemia however concerned about doing CT scan with contrast due to CKD.  Today:    Wt Readings from Last 3 Encounters:  02/28/24 290 lb (131.5 kg)  02/15/24 295 lb (133.8 kg)  02/14/24 295 lb 6.4 oz (134 kg)    Current Outpatient Medications  Medication Sig Dispense Refill   acetaminophen  (TYLENOL ) 325 MG tablet Take 975 mg by mouth every 8 (eight) hours as needed for mild pain (pain score 1-3).     albuterol  (VENTOLIN  HFA) 108 (90 Base) MCG/ACT inhaler INHALE 2 PUFFS BY MOUTH EVERY 4 HOURS AS NEEDED FOR WHEEZING OR SHORTNESS OF BREATH (FOR RESCUE) 9 g 1   Alpha-Lipoic Acid 600 MG CAPS Take 1 capsule  (600 mg total) by mouth daily. For diabetic neuropathy 100 capsule 3   aspirin  EC 325 MG tablet Take 325 mg by mouth daily.     Blood Glucose Monitoring Suppl (ONETOUCH VERIO) w/Device KIT Use to test blood sugar twice daily as directed; DX: E11.69 1 kit 1   Continuous Glucose Sensor (FREESTYLE LIBRE 3 PLUS SENSOR) MISC Change sensor every 15 days. DX: E11.65 2 each 11   docusate sodium  (COLACE) 100 MG capsule Take 1 capsule (100 mg total) by mouth 2 (two) times daily as needed for mild constipation. 100 capsule 0   furosemide  (LASIX ) 40 MG tablet TAKE 1 TABLET BY MOUTH ONCE DAILY AS NEEDED FOR EDEMA OR FLUID 90 tablet 0   gabapentin  (NEURONTIN ) 600 MG tablet TAKE 1 & 1/2 (ONE & ONE-HALF) TABLETS BY MOUTH THREE TIMES DAILY 270 tablet 0   glucose blood (ONETOUCH VERIO) test strip Use to test blood sugar twice daily as directed; DX: E11.69 100 each 5   Insulin  Glargine (BASAGLAR  KWIKPEN) 100 UNIT/ML Inject 45-50 Units into the skin at bedtime. May increase up to 50 units if instructed by your provider. 45 mL 5   Insulin  Pen Needle 29G X MISC Use to inject insulin  daily as directed. DX:E11.65; please profile for future needs 300 each 5   metoprolol  tartrate (LOPRESSOR ) 25 MG tablet Take 1 tablet (25 mg total) by mouth 2 (two) times daily. 180 tablet 2   nitroGLYCERIN  (NITROSTAT ) 0.4 MG SL tablet Place 1 tablet (0.4  mg total) under the tongue every 5 (five) minutes x 3 doses as needed for chest pain (if no relief after 3rd dose, proceed to ED or call 911). 25 tablet 3   omeprazole  (PRILOSEC) 40 MG capsule Take 1 capsule (40 mg total) by mouth daily. For heartburn 90 capsule 3   OneTouch Delica Lancets 30G MISC Use to test blood sugar twice daily as directed; DX: E11.69 100 each 3   oxybutynin  (DITROPAN  XL) 5 MG 24 hr tablet Take 1 tablet (5 mg total) by mouth at bedtime. For bladder 90 tablet 3   oxyCODONE  (ROXICODONE ) 5 MG immediate release tablet Take 1 tablet (5 mg total) by mouth every 6 (six)  hours as needed for up to 10 doses for severe pain (pain score 7-10). 10 tablet 0   rosuvastatin  (CRESTOR ) 20 MG tablet Take 1 tablet (20 mg total) by mouth at bedtime. 90 tablet 3   Semaglutide  14 MG TABS Take 14 mg by mouth daily.     No current facility-administered medications for this visit.    Past Medical History:  Diagnosis Date   Arthritis    CAD (coronary artery disease)    BMS to circumflex 2007 - Dr. Grandville Lax   CKD (chronic kidney disease) stage 3, GFR 30-59 ml/min (HCC)    COPD (chronic obstructive pulmonary disease) (HCC)    Depression    Dyspnea    Essential hypertension    Falls frequently    GERD (gastroesophageal reflux disease)    HA (headache)    Hyperlipidemia    Hyperlipidemia associated with type 2 diabetes mellitus (HCC) 02/03/2011   Left-sided face pain    Morbid obesity (HCC)    Noncompliance    OSA (obstructive sleep apnea)    Uses CPAP   Type 2 diabetes mellitus (HCC)    Urinary incontinence    Urinary incontinence     Past Surgical History:  Procedure Laterality Date   ABDOMINAL HERNIA REPAIR     ABDOMINAL HYSTERECTOMY     BACK SURGERY     BREAST SURGERY     biopsy per right breast; pt states has silver piece in for marking    CARPAL TUNNEL RELEASE Left 06/13/2023   Procedure: LEFT CARPAL TUNNEL RELEASE;  Surgeon: Tonita Frater, MD;  Location: [facility];  Service: Orthopedics;  Laterality: Left;   Cataract surgery Bilateral    CHOLECYSTECTOMY     COLONOSCOPY N/A 04/10/2013   Procedure: COLONOSCOPY;  Surgeon: Ruby Corporal, MD;  Location: [facility];  Service: Endoscopy;  Laterality: N/A;   CORONARY ANGIOPLASTY WITH STENT PLACEMENT  2012   HERNIA REPAIR     LEFT HEART CATH AND CORONARY ANGIOGRAPHY N/A 06/19/2020   Procedure: LEFT HEART CATH AND CORONARY ANGIOGRAPHY;  Surgeon: Arty Binning, MD;  Location: [facility];  Service: Cardiovascular;  Laterality: N/A;   LUMBAR LAMINECTOMY/DECOMPRESSION MICRODISCECTOMY N/A 04/25/2015    Procedure: CENTRAL LUMBAR /DECOMPRESSION L4-L5;  Surgeon: Hazle Lites, MD;  Location: [facility];  Service: Orthopedics;  Laterality: N/A;   TUBAL LIGATION      Family History  Problem Relation Age of Onset   Breast cancer Mother    Cancer Father    Coronary artery disease Father    Cancer - Colon Father    Diabetes Father    High Cholesterol Father    Arthritis Sister    Asthma Sister    High Cholesterol Daughter    Thyroid  disease Daughter    Hiatal hernia Son  Congenital heart disease Son     Allergies as of 03/06/2024 - Review Complete 02/28/2024  Allergen Reaction Noted   Farxiga  [dapagliflozin ] Other (See Comments) 10/08/2020   Morphine  Nausea And Vomiting 06/09/2023   Duloxetine  Other (See Comments) 06/23/2021   Trulicity  [dulaglutide ] Other (See Comments) 05/06/2023    Social History   Socioeconomic History   Marital status: Married    Spouse name: Sammie Crigler   Number of children: 5   Years of education: 10 th   Highest education level: 10th grade  Occupational History   Occupation: disabled     Comment: disabled  Tobacco Use   Smoking status: Every Day    Current packs/day: 1.00    Average packs/day: 1 pack/day for 51.8 years (51.8 ttl pk-yrs)    Types: Cigarettes    Start date: 05/14/1972    Passive exposure: Current   Smokeless tobacco: Never   Tobacco comments:    Smoking Cessation Classes, Services, Agencies & Resources Offered.  Vaping Use   Vaping status: Never Used  Substance and Sexual Activity   Alcohol  use: No    Alcohol /week: 0.0 standard drinks of alcohol    Drug use: No   Sexual activity: Not Currently    Partners: Male    Birth control/protection: Surgical  Other Topics Concern   Not on file  Social History Narrative   Patient lives at home with her husband. Patient is disabled.   Children live nearby   Patient has 10 th grade education.   Right handed.   Caffeine- one  cup of coffee and  One soda Dr.Pepper/ tea. daily   Social  Drivers of Health   Financial Resource Strain: Medium Risk (01/10/2024)   Overall Financial Resource Strain (CARDIA)    Difficulty of Paying Living Expenses: Somewhat hard  Food Insecurity: No Food Insecurity (02/21/2024)   Hunger Vital Sign    Worried About Running Out of Food in the Last Year: Never true    Ran Out of Food in the Last Year: Never true  Transportation Needs: No Transportation Needs (02/21/2024)   PRAPARE - Administrator, Civil Service (Medical): No    Lack of Transportation (Non-Medical): No  Physical Activity: Inactive (01/10/2024)   Exercise Vital Sign    Days of Exercise per Week: 0 days    Minutes of Exercise per Session: 0 min  Stress: No Stress Concern Present (01/10/2024)   [employer] of Occupational Health - Occupational Stress Questionnaire    Feeling of Stress : Not at all  Social Connections: Moderately Integrated (02/15/2024)   Social Connection and Isolation Panel [NHANES]    Frequency of Communication with Friends and Family: More than three times a week    Frequency of Social Gatherings with Friends and Family: Three times a week    Attends Religious Services: More than 4 times per year    Active Member of Clubs or Organizations: No    Attends Banker Meetings: Never    Marital Status: Married  Catering manager Violence: Not At Risk (02/21/2024)   Humiliation, Afraid, Rape, and Kick questionnaire    Fear of Current or Ex-Partner: No    Emotionally Abused: No    Physically Abused: No    Sexually Abused: No     Review of Systems   Gen: Denies any fever, chills, fatigue, weight loss, lack of appetite.  CV: Denies chest pain, heart palpitations, peripheral edema, syncope.  Resp: Denies shortness of breath at rest or with exertion. Denies  wheezing or cough.  GI: see HPI GU : Denies urinary burning, urinary frequency, urinary hesitancy MS: Denies joint pain, muscle weakness, cramps, or limitation of movement.  Derm: Denies  rash, itching, dry skin Psych: Denies depression, anxiety, memory loss, and confusion Heme: Denies bruising, bleeding, and enlarged lymph nodes.  Physical Exam   There were no vitals taken for this visit.  General:   Alert and oriented. Pleasant and cooperative. Well-nourished and well-developed.  Head:  Normocephalic and atraumatic. Eyes:  Without icterus, sclera clear and conjunctiva pink.  Ears:  Normal auditory acuity. Mouth:  No deformity or lesions, oral mucosa pink.  Lungs:  Clear to auscultation bilaterally. No wheezes, rales, or rhonchi. No distress.  Heart:  S1, S2 present without murmurs appreciated.  Abdomen:  +BS, soft, non-tender and non-distended. No HSM noted. No guarding or rebound. No masses appreciated.  Rectal:  Deferred  Msk:  Symmetrical without gross deformities. Normal posture. Extremities:  Without edema. Neurologic:  Alert and  oriented x4;  grossly normal neurologically. Skin:  Intact without significant lesions or rashes. Psych:  Alert and cooperative. Normal mood and affect.  Assessment   [name] is a 63 y.o. female with a history of urinary incontinence, obesity, HLD, HTN, depression, OSA on CPAP, COPD, CKD stage III, and CAD presenting today for evaluation of early satiety ***.  Early satiety:  Having abdominal pain with meals and complaints of early satiety.   Rectal bleeding: ***  Screening for colon cancer: Last colonoscopy in 2014 was normal, currently overdue.  PLAN   *** Mesenteric Dopplers given CKD and concerns for giving contrast for CTA Colonoscopy?   Julian Obey, MSN, FNP-BC, AGACNP-BC [facility]

## 2024-03-06 ENCOUNTER — Encounter: Payer: Self-pay | Admitting: Gastroenterology

## 2024-03-06 ENCOUNTER — Ambulatory Visit (INDEPENDENT_AMBULATORY_CARE_PROVIDER_SITE_OTHER): Admitting: Gastroenterology

## 2024-03-06 ENCOUNTER — Telehealth: Payer: Self-pay | Admitting: Family Medicine

## 2024-03-06 ENCOUNTER — Telehealth: Payer: Self-pay | Admitting: *Deleted

## 2024-03-06 ENCOUNTER — Encounter: Payer: Self-pay | Admitting: *Deleted

## 2024-03-06 ENCOUNTER — Telehealth: Payer: Self-pay | Admitting: Gastroenterology

## 2024-03-06 VITALS — BP 116/68 | HR 88 | Temp 97.7°F | Ht 65.0 in | Wt 293.0 lb

## 2024-03-06 DIAGNOSIS — R6881 Early satiety: Secondary | ICD-10-CM

## 2024-03-06 DIAGNOSIS — K625 Hemorrhage of anus and rectum: Secondary | ICD-10-CM | POA: Diagnosis not present

## 2024-03-06 DIAGNOSIS — K5900 Constipation, unspecified: Secondary | ICD-10-CM

## 2024-03-06 DIAGNOSIS — R197 Diarrhea, unspecified: Secondary | ICD-10-CM | POA: Diagnosis not present

## 2024-03-06 DIAGNOSIS — R109 Unspecified abdominal pain: Secondary | ICD-10-CM

## 2024-03-06 DIAGNOSIS — Z8719 Personal history of other diseases of the digestive system: Secondary | ICD-10-CM

## 2024-03-06 DIAGNOSIS — Z800 Family history of malignant neoplasm of digestive organs: Secondary | ICD-10-CM

## 2024-03-06 MED ORDER — DICYCLOMINE HCL 10 MG PO CAPS: 10.0000 mg | ORAL_CAPSULE | Freq: Two times a day (BID) | ORAL | 1 refills | Status: DC | PRN

## 2024-03-06 NOTE — Telephone Encounter (Signed)
 Left vm for cb

## 2024-03-06 NOTE — Telephone Encounter (Signed)
 Called pt, LMOVM to give Mesenteric doppler appt. Scheduled 5/14, arrival 10:15am, npo midnight

## 2024-03-06 NOTE — Telephone Encounter (Signed)
 Pt left office before scheduling 8 week appt called left pt voicemail

## 2024-03-06 NOTE — Telephone Encounter (Signed)
 Copied from CRM [id number]. Topic: General - Other >> Mar 06, 2024  3:24 PM [place] B wrote: Reason for CRM: pt called in, about telemasartan med, and if she needs to come in to get her legs wrapped or if someone is coming to her home. I see message about order being placed today, is this for the telamasartan ?and please cb about how her legs will be wrapped

## 2024-03-06 NOTE — Addendum Note (Signed)
 Addended by: Eliodoro Guerin on: 03/06/2024 11:48 AM   Modules accepted: Orders

## 2024-03-06 NOTE — Patient Instructions (Addendum)
 We are scheduling you for a special ultrasound to look at the blood flow to your small bowel and colon.   There are some stool studies that I have also ordered for you that would like for you to have completed.  You can go to [facility], any location that you have access to.  I have attached the locations here in [place] below.  Please have stool studies completed at [facility].  We will call you with results once they have been received. Please allow 3-5 business days for review. 2 locations for [facility] in [place]:              1. [address], [place]              2. [address] [place], [place]   You may continue activity and focusing on some protein intake.  I have sent dicyclomine (Bentyl) to your pharmacy for you this is for you to use in severe cases of abdominal cramping.  Please let me know if you have any worsening constipation or if you have any return of any significant rectal bleeding.  We will plan on colonoscopy after this initial workup, possible upper endoscopy depending findings.  It was a pleasure to see you today. I want to create trusting relationships with patients. If you receive a survey regarding your visit,  I greatly appreciate you taking time to fill this out on paper or through your MyChart. I value your feedback.  Julian Obey, MSN, FNP-BC, AGACNP-BC [facility]

## 2024-03-06 NOTE — Telephone Encounter (Signed)
 New order placed

## 2024-03-06 NOTE — Telephone Encounter (Signed)
 Again, as long as her BP is LOW she should NOT restart the telmisartan .  Have her check BP or come in for nurse to check so we can make that decision. Based on her last OV, it was too low to restart. Please see previous note in EMR where her HH was already reordered today.

## 2024-03-06 NOTE — Telephone Encounter (Signed)
 Want pt to wait for home care or come back in for another leg wrap?

## 2024-03-06 NOTE — Telephone Encounter (Signed)
 Pools, her BP was soft during her hospital follow up.  I'd like to know what it is running before she starts back on a blood pressure medication. Cathy, Do I need to place a whole new order or can we give verbal. She's still getting PT from [facility]

## 2024-03-06 NOTE — Telephone Encounter (Signed)
 She was discharged from service on the 23rd, she will need a whole new order

## 2024-03-08 ENCOUNTER — Encounter: Payer: Self-pay | Admitting: Family Medicine

## 2024-03-12 NOTE — Telephone Encounter (Signed)
 Spoke with pt. And she is aware of appt details.

## 2024-03-20 ENCOUNTER — Ambulatory Visit: Payer: No Typology Code available for payment source | Admitting: Family Medicine

## 2024-03-20 NOTE — Telephone Encounter (Signed)
 Patient aware and verbalizes understanding.   Appointment scheduled tomorrow with DOD for patients legs to get evaluated.

## 2024-03-21 ENCOUNTER — Ambulatory Visit

## 2024-03-21 ENCOUNTER — Ambulatory Visit (HOSPITAL_COMMUNITY)
Admission: RE | Admit: 2024-03-21 | Discharge: 2024-03-21 | Disposition: A | Discharge status: Home / Self Care | Source: Ambulatory Visit | Attending: Gastroenterology | Admitting: Gastroenterology

## 2024-03-21 ENCOUNTER — Encounter: Payer: Self-pay | Admitting: Family Medicine

## 2024-03-21 VITALS — BP 124/61 | HR 77 | Ht 65.0 in | Wt 296.0 lb

## 2024-03-21 DIAGNOSIS — Z9181 History of falling: Secondary | ICD-10-CM

## 2024-03-21 DIAGNOSIS — R6881 Early satiety: Secondary | ICD-10-CM | POA: Diagnosis not present

## 2024-03-21 DIAGNOSIS — K625 Hemorrhage of anus and rectum: Secondary | ICD-10-CM | POA: Diagnosis not present

## 2024-03-21 DIAGNOSIS — N184 Chronic kidney disease, stage 4 (severe): Secondary | ICD-10-CM | POA: Diagnosis not present

## 2024-03-21 DIAGNOSIS — R109 Unspecified abdominal pain: Secondary | ICD-10-CM | POA: Diagnosis not present

## 2024-03-21 DIAGNOSIS — I890 Lymphedema, not elsewhere classified: Secondary | ICD-10-CM | POA: Diagnosis not present

## 2024-03-21 NOTE — Progress Notes (Signed)
 BP 124/61   Pulse 77   Ht 5\' 5"  (1.651 m)   Wt 296 lb (134.3 kg)   SpO2 97%   BMI 49.26 kg/m    Subjective:   Patient ID: [name], female    DOB: February 09, 1961, 63 y.o.   MRN: [id number]  HPI: [name] is a 63 y.o. female presenting on 03/21/2024 for Edema   HPI Lymphedema Patient is coming for lymphedema.  She was getting home health coming out to her house to help wrap her legs and doing Unna boots but they stopped coming last week because her insurance did not pay for it anymore.  So it has been almost 2 weeks since she had her legs wrapped.  She is doing a lot more swelling and pain especially on the soles of her feet that are pins-and-needles from the swelling.  She denies any wounds or drainage.  Relevant past medical, surgical, family and social history reviewed and updated as indicated. Interim medical history since our last visit reviewed. Allergies and medications reviewed and updated.  Review of Systems  Constitutional:  Negative for chills and fever.  Eyes:  Negative for visual disturbance.  Respiratory:  Negative for chest tightness and shortness of breath.   Cardiovascular:  Positive for leg swelling. Negative for chest pain.  Musculoskeletal:  Negative for back pain and gait problem.  Skin:  Negative for rash.  Neurological:  Negative for light-headedness and headaches.  Psychiatric/Behavioral:  Negative for agitation and behavioral problems.   All other systems reviewed and are negative.   Per HPI unless specifically indicated above   Allergies as of 03/21/2024       Reactions   Farxiga  [dapagliflozin ] Other (See Comments)   Recurrent yeast infections   Morphine  Nausea And Vomiting   Duloxetine  Other (See Comments)   Unknown    Trulicity  [dulaglutide ] Other (See Comments)   GI adverse effects (okay on Rybelsus )        Medication List        Accurate as of Mar 21, 2024  3:03 PM. If you have any questions, ask your nurse or doctor.           acetaminophen  325 MG tablet Commonly known as: TYLENOL  Take 975 mg by mouth every 8 (eight) hours as needed for mild pain (pain score 1-3).   albuterol  108 (90 Base) MCG/ACT inhaler Commonly known as: VENTOLIN  HFA INHALE 2 PUFFS BY MOUTH EVERY 4 HOURS AS NEEDED FOR WHEEZING OR SHORTNESS OF BREATH (FOR RESCUE)   Alpha-Lipoic Acid 600 MG Caps Take 1 capsule (600 mg total) by mouth daily. For diabetic neuropathy   aspirin  EC 325 MG tablet Take 325 mg by mouth daily.   Basaglar  KwikPen 100 UNIT/ML Inject 45-50 Units into the skin at bedtime. May increase up to 50 units if instructed by your provider.   dicyclomine  10 MG capsule Commonly known as: BENTYL  Take 1 capsule (10 mg total) by mouth 2 (two) times daily as needed for spasms.   docusate sodium  100 MG capsule Commonly known as: Colace Take 1 capsule (100 mg total) by mouth 2 (two) times daily as needed for mild constipation.   FreeStyle Libre 3 Plus Sensor Misc Change sensor every 15 days. DX: E11.65   furosemide  40 MG tablet Commonly known as: LASIX  TAKE 1 TABLET BY MOUTH ONCE DAILY AS NEEDED FOR EDEMA OR FLUID   gabapentin  600 MG tablet Commonly known as: NEURONTIN  TAKE 1 & 1/2 (ONE & ONE-HALF) TABLETS  BY MOUTH THREE TIMES DAILY   Insulin  Pen Needle 29G X Misc Use to inject insulin  daily as directed. DX:E11.65; please profile for future needs   metoprolol  tartrate 25 MG tablet Commonly known as: LOPRESSOR  Take 1 tablet (25 mg total) by mouth 2 (two) times daily.   nitroGLYCERIN  0.4 MG SL tablet Commonly known as: NITROSTAT  Place 1 tablet (0.4 mg total) under the tongue every 5 (five) minutes x 3 doses as needed for chest pain (if no relief after 3rd dose, proceed to ED or call 911).   omeprazole  40 MG capsule Commonly known as: PRILOSEC Take 1 capsule (40 mg total) by mouth daily. For heartburn   OneTouch Delica Lancets 30G Misc Use to test blood sugar twice daily as directed; DX: E11.69    OneTouch Verio test strip Generic drug: glucose blood Use to test blood sugar twice daily as directed; DX: E11.69   OneTouch Verio w/Device Kit Use to test blood sugar twice daily as directed; DX: E11.69   oxybutynin  5 MG 24 hr tablet Commonly known as: Ditropan  XL Take 1 tablet (5 mg total) by mouth at bedtime. For bladder   oxyCODONE  5 MG immediate release tablet Commonly known as: Roxicodone  Take 1 tablet (5 mg total) by mouth every 6 (six) hours as needed for up to 10 doses for severe pain (pain score 7-10).   rosuvastatin  20 MG tablet Commonly known as: CRESTOR  Take 1 tablet (20 mg total) by mouth at bedtime.   Semaglutide  14 MG Tabs Take 14 mg by mouth daily.         Objective:   BP 124/61   Pulse 77   Ht 5\' 5"  (1.651 m)   Wt 296 lb (134.3 kg)   SpO2 97%   BMI 49.26 kg/m   Wt Readings from Last 3 Encounters:  03/21/24 296 lb (134.3 kg)  03/06/24 293 lb (132.9 kg)  02/28/24 290 lb (131.5 kg)    Physical Exam Vitals and nursing note reviewed.  Constitutional:      General: She is not in acute distress.    Appearance: She is well-developed. She is not diaphoretic.  Eyes:     Conjunctiva/sclera: Conjunctivae normal.  Musculoskeletal:        General: Swelling (4+ lymphedema in bilateral lower extremities, nonpitting) and tenderness present. Normal range of motion.  Skin:    General: Skin is warm and dry.     Findings: No erythema, lesion or rash.  Neurological:     Mental Status: She is alert and oriented to person, place, and time.     Coordination: Coordination normal.  Psychiatric:        Behavior: Behavior normal.       Assessment & Plan:   Problem List Items Addressed This Visit   None Visit Diagnoses       Lymphedema    -  Primary   Relevant Orders   Ambulatory referral to Physical Therapy     CKD (chronic kidney disease) stage 4, GFR 15-29 ml/min (HCC)         At moderate risk for fall       Relevant Orders   Ambulatory referral to  Physical Therapy       Patient has CKD and sleep and history of CAD  Nurse to place Unna boot today, will do referral to lymphedema clinic.  1 week follow-up with PCP for edema, will place Unna boot and referral to lymphedema clinic Follow up plan: Return if symptoms worsen  or fail to improve, for 1 week follow-up with PCP for edema.  Counseling provided for all of the vaccine components Orders Placed This Encounter  Procedures   Ambulatory referral to Physical Therapy    Jolyne Needs, MD [facility] 03/21/2024, 3:03 PM

## 2024-03-25 ENCOUNTER — Other Ambulatory Visit: Payer: Self-pay | Admitting: Family Medicine

## 2024-03-25 DIAGNOSIS — E1142 Type 2 diabetes mellitus with diabetic polyneuropathy: Secondary | ICD-10-CM

## 2024-03-26 ENCOUNTER — Ambulatory Visit: Payer: Self-pay | Admitting: Gastroenterology

## 2024-03-28 ENCOUNTER — Other Ambulatory Visit: Payer: Self-pay | Admitting: *Deleted

## 2024-03-28 ENCOUNTER — Encounter: Payer: Self-pay | Admitting: *Deleted

## 2024-03-28 ENCOUNTER — Encounter: Payer: Self-pay | Admitting: Family Medicine

## 2024-03-28 ENCOUNTER — Ambulatory Visit (INDEPENDENT_AMBULATORY_CARE_PROVIDER_SITE_OTHER): Admitting: Family Medicine

## 2024-03-28 VITALS — BP 132/88 | HR 86 | Temp 97.9°F | Ht 65.0 in | Wt 296.6 lb

## 2024-03-28 DIAGNOSIS — E1122 Type 2 diabetes mellitus with diabetic chronic kidney disease: Secondary | ICD-10-CM | POA: Diagnosis not present

## 2024-03-28 DIAGNOSIS — E1169 Type 2 diabetes mellitus with other specified complication: Secondary | ICD-10-CM | POA: Diagnosis not present

## 2024-03-28 DIAGNOSIS — E785 Hyperlipidemia, unspecified: Secondary | ICD-10-CM | POA: Diagnosis not present

## 2024-03-28 DIAGNOSIS — Z794 Long term (current) use of insulin: Secondary | ICD-10-CM | POA: Diagnosis not present

## 2024-03-28 DIAGNOSIS — N3281 Overactive bladder: Secondary | ICD-10-CM | POA: Diagnosis not present

## 2024-03-28 DIAGNOSIS — K219 Gastro-esophageal reflux disease without esophagitis: Secondary | ICD-10-CM

## 2024-03-28 DIAGNOSIS — N3946 Mixed incontinence: Secondary | ICD-10-CM

## 2024-03-28 DIAGNOSIS — E1142 Type 2 diabetes mellitus with diabetic polyneuropathy: Secondary | ICD-10-CM

## 2024-03-28 DIAGNOSIS — Z6841 Body Mass Index (BMI) 40.0 and over, adult: Secondary | ICD-10-CM | POA: Diagnosis not present

## 2024-03-28 DIAGNOSIS — N179 Acute kidney failure, unspecified: Secondary | ICD-10-CM | POA: Diagnosis not present

## 2024-03-28 DIAGNOSIS — N1832 Chronic kidney disease, stage 3b: Secondary | ICD-10-CM | POA: Diagnosis not present

## 2024-03-28 DIAGNOSIS — A4151 Sepsis due to Escherichia coli [E. coli]: Secondary | ICD-10-CM | POA: Diagnosis not present

## 2024-03-28 DIAGNOSIS — Z1211 Encounter for screening for malignant neoplasm of colon: Secondary | ICD-10-CM

## 2024-03-28 DIAGNOSIS — N12 Tubulo-interstitial nephritis, not specified as acute or chronic: Secondary | ICD-10-CM | POA: Diagnosis not present

## 2024-03-28 DIAGNOSIS — I890 Lymphedema, not elsewhere classified: Secondary | ICD-10-CM

## 2024-03-28 DIAGNOSIS — E1159 Type 2 diabetes mellitus with other circulatory complications: Secondary | ICD-10-CM | POA: Diagnosis not present

## 2024-03-28 DIAGNOSIS — I152 Hypertension secondary to endocrine disorders: Secondary | ICD-10-CM | POA: Diagnosis not present

## 2024-03-28 DIAGNOSIS — J449 Chronic obstructive pulmonary disease, unspecified: Secondary | ICD-10-CM | POA: Diagnosis not present

## 2024-03-28 MED ORDER — GABAPENTIN 600 MG PO TABS
ORAL_TABLET | ORAL | 12 refills | Status: AC
Start: 2024-03-28 — End: ?

## 2024-03-28 MED ORDER — OXYBUTYNIN CHLORIDE ER 5 MG PO TB24: 5.0000 mg | ORAL_TABLET | Freq: Every day | ORAL | 3 refills | Status: AC

## 2024-03-28 MED ORDER — ROSUVASTATIN CALCIUM 20 MG PO TABS: 20.0000 mg | ORAL_TABLET | Freq: Every day | ORAL | 3 refills | Status: AC

## 2024-03-28 MED ORDER — OMEPRAZOLE 40 MG PO CPDR: 40.0000 mg | DELAYED_RELEASE_CAPSULE | Freq: Every day | ORAL | 3 refills | Status: AC

## 2024-03-28 NOTE — Progress Notes (Signed)
 Subjective: Stacey PCP: Eliodoro Guerin, DO ACZ:YSAYT BABETTE [name] is a 63 y.o. female presenting to clinic today for:  1. Lymphedema Persistent lymphedema to lower extremities.  She was finally contacted by home health and they will start coming back out to wrap her legs.  She was additionally seen by one of my partners last week who wrapped her legs and refer her to outpatient physical therapy for ongoing leg wraps.  She reports pain in the lower extremity, increased swelling.  She had her imaging study last week with GI to evaluate for mesenteric ischemia and this was negative.   ROS: Per HPI  Allergies  Allergen Reactions   Farxiga  [Dapagliflozin ] Other (See Comments)    Recurrent yeast infections   Morphine  Nausea And Vomiting   Duloxetine  Other (See Comments)    Unknown    Trulicity  [Dulaglutide ] Other (See Comments)    GI adverse effects (okay on Rybelsus )   Past Medical History:  Diagnosis Date   Arthritis    CAD (coronary artery disease)    BMS to circumflex 2007 - Dr. Grandville Lax   CKD (chronic kidney disease) stage 3, GFR 30-59 ml/min (HCC)    COPD (chronic obstructive pulmonary disease) (HCC)    Depression    Dyspnea    Essential hypertension    Falls frequently    GERD (gastroesophageal reflux disease)    HA (headache)    Hyperlipidemia    Hyperlipidemia associated with type 2 diabetes mellitus (HCC) 02/03/2011   Left-sided face pain    Morbid obesity (HCC)    Noncompliance    OSA (obstructive sleep apnea)    Uses CPAP   Type 2 diabetes mellitus (HCC)    Urinary incontinence    Urinary incontinence     Current Outpatient Medications:    acetaminophen  (TYLENOL ) 325 MG tablet, Take 975 mg by mouth every 8 (eight) hours as needed for mild pain (pain score 1-3)., Disp: , Rfl:    albuterol  (VENTOLIN  HFA) 108 (90 Base) MCG/ACT inhaler, INHALE 2 PUFFS BY MOUTH EVERY 4 HOURS AS NEEDED FOR WHEEZING OR SHORTNESS OF BREATH (FOR RESCUE), Disp: 9 g, Rfl: 1    Alpha-Lipoic Acid 600 MG CAPS, Take 1 capsule (600 mg total) by mouth daily. For diabetic neuropathy, Disp: 100 capsule, Rfl: 3   aspirin  EC 325 MG tablet, Take 325 mg by mouth daily., Disp: , Rfl:    Blood Glucose Monitoring Suppl (ONETOUCH VERIO) w/Device KIT, Use to test blood sugar twice daily as directed; DX: E11.69, Disp: 1 kit, Rfl: 1   Continuous Glucose Sensor (FREESTYLE LIBRE 3 PLUS SENSOR) MISC, Change sensor every 15 days. DX: E11.65, Disp: 2 each, Rfl: 11   dicyclomine  (BENTYL ) 10 MG capsule, Take 1 capsule (10 mg total) by mouth 2 (two) times daily as needed for spasms., Disp: 30 capsule, Rfl: 1   docusate sodium  (COLACE) 100 MG capsule, Take 1 capsule (100 mg total) by mouth 2 (two) times daily as needed for mild constipation., Disp: 100 capsule, Rfl: 0   furosemide  (LASIX ) 40 MG tablet, TAKE 1 TABLET BY MOUTH ONCE DAILY AS NEEDED FOR EDEMA OR FLUID, Disp: 90 tablet, Rfl: 0   gabapentin  (NEURONTIN ) 600 MG tablet, TAKE 1 & 1/2 (ONE & ONE-HALF) TABLETS BY MOUTH THREE TIMES DAILY, Disp: 270 tablet, Rfl: 0   glucose blood (ONETOUCH VERIO) test strip, Use to test blood sugar twice daily as directed; DX: E11.69, Disp: 100 each, Rfl: 5   Insulin  Glargine (BASAGLAR  KWIKPEN) 100 UNIT/ML, Inject  45-50 Units into the skin at bedtime. May increase up to 50 units if instructed by your provider., Disp: 45 mL, Rfl: 5   Insulin  Pen Needle 29G X MISC, Use to inject insulin  daily as directed. DX:E11.65; please profile for future needs, Disp: 300 each, Rfl: 5   metoprolol  tartrate (LOPRESSOR ) 25 MG tablet, Take 1 tablet (25 mg total) by mouth 2 (two) times daily., Disp: 180 tablet, Rfl: 2   nitroGLYCERIN  (NITROSTAT ) 0.4 MG SL tablet, Place 1 tablet (0.4 mg total) under the tongue every 5 (five) minutes x 3 doses as needed for chest pain (if no relief after 3rd dose, proceed to ED or call 911)., Disp: 25 tablet, Rfl: 3   omeprazole  (PRILOSEC) 40 MG capsule, Take 1 capsule (40 mg total) by mouth daily.  For heartburn, Disp: 90 capsule, Rfl: 3   OneTouch Delica Lancets 30G MISC, Use to test blood sugar twice daily as directed; DX: E11.69, Disp: 100 each, Rfl: 3   oxybutynin  (DITROPAN  XL) 5 MG 24 hr tablet, Take 1 tablet (5 mg total) by mouth at bedtime. For bladder, Disp: 90 tablet, Rfl: 3   oxyCODONE  (ROXICODONE ) 5 MG immediate release tablet, Take 1 tablet (5 mg total) by mouth every 6 (six) hours as needed for up to 10 doses for severe pain (pain score 7-10)., Disp: 10 tablet, Rfl: 0   rosuvastatin  (CRESTOR ) 20 MG tablet, Take 1 tablet (20 mg total) by mouth at bedtime., Disp: 90 tablet, Rfl: 3   Semaglutide  14 MG TABS, Take 14 mg by mouth daily., Disp: , Rfl:  Social History   Socioeconomic History   Marital status: Married    Spouse name: [name]   Number of children: 5   Years of education: 10 th   Highest education level: 10th grade  Occupational History   Occupation: disabled     Comment: disabled  Tobacco Use   Smoking status: Every Day    Current packs/day: 1.00    Average packs/day: 1 pack/day for 51.9 years (51.9 ttl pk-yrs)    Types: Cigarettes    Start date: 05/14/1972    Passive exposure: Current   Smokeless tobacco: Never   Tobacco comments:    Smoking Cessation Classes, Services, Agencies & Resources Offered.  Vaping Use   Vaping status: Never Used  Substance and Sexual Activity   Alcohol  use: No    Alcohol /week: 0.0 standard drinks of alcohol    Drug use: No   Sexual activity: Not Currently    Partners: Male    Birth control/protection: Surgical  Other Topics Concern   Not on file  Social History Narrative   Patient lives at home with her husband. Patient is disabled.   Children live nearby   Patient has 10 th grade education.   Right handed.   Caffeine- one  cup of coffee and  One soda Dr.Pepper/ tea. daily   Social Drivers of Health   Financial Resource Strain: Medium Risk (01/10/2024)   Overall Financial Resource Strain (CARDIA)    Difficulty of Paying  Living Expenses: Somewhat hard  Food Insecurity: No Food Insecurity (02/21/2024)   Hunger Vital Sign    Worried About Running Out of Food in the Last Year: Never true    Ran Out of Food in the Last Year: Never true  Transportation Needs: No Transportation Needs (02/21/2024)   PRAPARE - Administrator, Civil Service (Medical): No    Lack of Transportation (Non-Medical): No  Physical Activity: Inactive (01/10/2024)  Exercise Vital Sign    Days of Exercise per Week: 0 days    Minutes of Exercise per Session: 0 min  Stress: No Stress Concern Present (01/10/2024)   [employer] of Occupational Health - Occupational Stress Questionnaire    Feeling of Stress : Not at all  Social Connections: Moderately Integrated (02/15/2024)   Social Connection and Isolation Panel [NHANES]    Frequency of Communication with Friends and Family: More than three times a week    Frequency of Social Gatherings with Friends and Family: Three times a week    Attends Religious Services: More than 4 times per year    Active Member of Clubs or Organizations: No    Attends Banker Meetings: Never    Marital Status: Married  Catering manager Violence: Not At Risk (02/21/2024)   Humiliation, Afraid, Rape, and Kick questionnaire    Fear of Current or Ex-Partner: No    Emotionally Abused: No    Physically Abused: No    Sexually Abused: No   Family History  Problem Relation Age of Onset   Breast cancer Mother    Cancer Father    Coronary artery disease Father    Cancer - Colon Father    Diabetes Father    High Cholesterol Father    Arthritis Sister    Asthma Sister    High Cholesterol Daughter    Thyroid  disease Daughter    Hiatal hernia Son    Congenital heart disease Son     Objective: Office vital signs reviewed. BP 132/88   Pulse 86   Temp 97.9 F (36.6 C)   Ht 5\' 5"  (1.651 m)   Wt 296 lb 9.6 oz (134.5 kg)   SpO2 96%   BMI 49.36 kg/m   Physical Examination:  General:  Awake, alert, morbidly obese, No acute distress Extremities: Lymphedema present bilaterally without evidence of secondary cellulitis.  No weeping of the legs.  She is ambulating with use of cane  Assessment/ Plan: 62 y.o. female   Lymphedema  Type 2 diabetes mellitus with diabetic polyneuropathy, with long-term current use of insulin  (HCC) - Plan: gabapentin  (NEURONTIN ) 600 MG tablet  Hyperlipidemia associated with type 2 diabetes mellitus (HCC) - Plan: rosuvastatin  (CRESTOR ) 20 MG tablet  Mixed stress and urge urinary incontinence - Plan: oxybutynin  (DITROPAN  XL) 5 MG 24 hr tablet  Overactive bladder - Plan: oxybutynin  (DITROPAN  XL) 5 MG 24 hr tablet  Gastroesophageal reflux disease without esophagitis - Plan: omeprazole  (PRILOSEC) 40 MG capsule  Legs rewrapped today.  Home health to resume  Did not discuss diabetes, lipid, stress incontinence, GERD but needed refills on all.  Has appointment next month for her normal checkup for diabetes   Eliodoro Guerin, DO [facility] [phone]

## 2024-03-30 DIAGNOSIS — N390 Urinary tract infection, site not specified: Secondary | ICD-10-CM | POA: Diagnosis not present

## 2024-03-30 DIAGNOSIS — A419 Sepsis, unspecified organism: Secondary | ICD-10-CM | POA: Diagnosis not present

## 2024-04-03 ENCOUNTER — Ambulatory Visit: Admitting: Urology

## 2024-04-03 ENCOUNTER — Ambulatory Visit (INDEPENDENT_AMBULATORY_CARE_PROVIDER_SITE_OTHER)

## 2024-04-03 DIAGNOSIS — I890 Lymphedema, not elsewhere classified: Secondary | ICD-10-CM | POA: Diagnosis not present

## 2024-04-03 DIAGNOSIS — E1169 Type 2 diabetes mellitus with other specified complication: Secondary | ICD-10-CM

## 2024-04-03 DIAGNOSIS — I159 Secondary hypertension, unspecified: Secondary | ICD-10-CM | POA: Diagnosis not present

## 2024-04-03 DIAGNOSIS — E785 Hyperlipidemia, unspecified: Secondary | ICD-10-CM | POA: Diagnosis not present

## 2024-04-03 DIAGNOSIS — N183 Chronic kidney disease, stage 3 unspecified: Secondary | ICD-10-CM

## 2024-04-03 DIAGNOSIS — E1159 Type 2 diabetes mellitus with other circulatory complications: Secondary | ICD-10-CM | POA: Diagnosis not present

## 2024-04-03 DIAGNOSIS — E1142 Type 2 diabetes mellitus with diabetic polyneuropathy: Secondary | ICD-10-CM | POA: Diagnosis not present

## 2024-04-03 DIAGNOSIS — E1122 Type 2 diabetes mellitus with diabetic chronic kidney disease: Secondary | ICD-10-CM | POA: Diagnosis not present

## 2024-04-03 DIAGNOSIS — J449 Chronic obstructive pulmonary disease, unspecified: Secondary | ICD-10-CM

## 2024-04-03 DIAGNOSIS — Z6841 Body Mass Index (BMI) 40.0 and over, adult: Secondary | ICD-10-CM

## 2024-04-03 DIAGNOSIS — Z794 Long term (current) use of insulin: Secondary | ICD-10-CM | POA: Diagnosis not present

## 2024-04-04 ENCOUNTER — Encounter: Payer: Self-pay | Admitting: Gastroenterology

## 2024-04-07 DIAGNOSIS — G4733 Obstructive sleep apnea (adult) (pediatric): Secondary | ICD-10-CM | POA: Diagnosis not present

## 2024-04-07 DIAGNOSIS — I129 Hypertensive chronic kidney disease with stage 1 through stage 4 chronic kidney disease, or unspecified chronic kidney disease: Secondary | ICD-10-CM | POA: Diagnosis not present

## 2024-04-07 DIAGNOSIS — E1122 Type 2 diabetes mellitus with diabetic chronic kidney disease: Secondary | ICD-10-CM | POA: Diagnosis not present

## 2024-04-07 DIAGNOSIS — N1831 Chronic kidney disease, stage 3a: Secondary | ICD-10-CM | POA: Diagnosis not present

## 2024-04-09 ENCOUNTER — Other Ambulatory Visit (HOSPITAL_COMMUNITY): Payer: Self-pay | Admitting: Nephrology

## 2024-04-09 DIAGNOSIS — N390 Urinary tract infection, site not specified: Secondary | ICD-10-CM | POA: Diagnosis not present

## 2024-04-09 DIAGNOSIS — N281 Cyst of kidney, acquired: Secondary | ICD-10-CM

## 2024-04-09 DIAGNOSIS — N1831 Chronic kidney disease, stage 3a: Secondary | ICD-10-CM

## 2024-04-09 DIAGNOSIS — A419 Sepsis, unspecified organism: Secondary | ICD-10-CM | POA: Diagnosis not present

## 2024-04-09 DIAGNOSIS — N184 Chronic kidney disease, stage 4 (severe): Secondary | ICD-10-CM

## 2024-04-13 ENCOUNTER — Ambulatory Visit (HOSPITAL_COMMUNITY)

## 2024-04-17 ENCOUNTER — Ambulatory Visit: Admitting: Family Medicine

## 2024-04-17 DIAGNOSIS — N1832 Chronic kidney disease, stage 3b: Secondary | ICD-10-CM | POA: Diagnosis not present

## 2024-04-17 DIAGNOSIS — E1122 Type 2 diabetes mellitus with diabetic chronic kidney disease: Secondary | ICD-10-CM | POA: Diagnosis not present

## 2024-04-17 DIAGNOSIS — A4151 Sepsis due to Escherichia coli [E. coli]: Secondary | ICD-10-CM | POA: Diagnosis not present

## 2024-04-17 DIAGNOSIS — E785 Hyperlipidemia, unspecified: Secondary | ICD-10-CM | POA: Diagnosis not present

## 2024-04-17 DIAGNOSIS — N12 Tubulo-interstitial nephritis, not specified as acute or chronic: Secondary | ICD-10-CM | POA: Diagnosis not present

## 2024-04-17 DIAGNOSIS — J449 Chronic obstructive pulmonary disease, unspecified: Secondary | ICD-10-CM | POA: Diagnosis not present

## 2024-04-17 DIAGNOSIS — E1169 Type 2 diabetes mellitus with other specified complication: Secondary | ICD-10-CM | POA: Diagnosis not present

## 2024-04-17 DIAGNOSIS — E1159 Type 2 diabetes mellitus with other circulatory complications: Secondary | ICD-10-CM | POA: Diagnosis not present

## 2024-04-17 DIAGNOSIS — Z6841 Body Mass Index (BMI) 40.0 and over, adult: Secondary | ICD-10-CM | POA: Diagnosis not present

## 2024-04-17 DIAGNOSIS — I152 Hypertension secondary to endocrine disorders: Secondary | ICD-10-CM | POA: Diagnosis not present

## 2024-04-17 DIAGNOSIS — N179 Acute kidney failure, unspecified: Secondary | ICD-10-CM | POA: Diagnosis not present

## 2024-04-17 DIAGNOSIS — Z794 Long term (current) use of insulin: Secondary | ICD-10-CM | POA: Diagnosis not present

## 2024-04-24 DIAGNOSIS — A4151 Sepsis due to Escherichia coli [E. coli]: Secondary | ICD-10-CM | POA: Diagnosis not present

## 2024-04-24 DIAGNOSIS — N179 Acute kidney failure, unspecified: Secondary | ICD-10-CM | POA: Diagnosis not present

## 2024-04-24 DIAGNOSIS — Z6841 Body Mass Index (BMI) 40.0 and over, adult: Secondary | ICD-10-CM | POA: Diagnosis not present

## 2024-04-24 DIAGNOSIS — E1169 Type 2 diabetes mellitus with other specified complication: Secondary | ICD-10-CM | POA: Diagnosis not present

## 2024-04-24 DIAGNOSIS — J449 Chronic obstructive pulmonary disease, unspecified: Secondary | ICD-10-CM | POA: Diagnosis not present

## 2024-04-24 DIAGNOSIS — I152 Hypertension secondary to endocrine disorders: Secondary | ICD-10-CM | POA: Diagnosis not present

## 2024-04-24 DIAGNOSIS — N12 Tubulo-interstitial nephritis, not specified as acute or chronic: Secondary | ICD-10-CM | POA: Diagnosis not present

## 2024-04-24 DIAGNOSIS — Z794 Long term (current) use of insulin: Secondary | ICD-10-CM | POA: Diagnosis not present

## 2024-04-24 DIAGNOSIS — E785 Hyperlipidemia, unspecified: Secondary | ICD-10-CM | POA: Diagnosis not present

## 2024-04-24 DIAGNOSIS — E1122 Type 2 diabetes mellitus with diabetic chronic kidney disease: Secondary | ICD-10-CM | POA: Diagnosis not present

## 2024-04-24 DIAGNOSIS — N1832 Chronic kidney disease, stage 3b: Secondary | ICD-10-CM | POA: Diagnosis not present

## 2024-04-24 DIAGNOSIS — E1159 Type 2 diabetes mellitus with other circulatory complications: Secondary | ICD-10-CM | POA: Diagnosis not present

## 2024-04-25 ENCOUNTER — Ambulatory Visit (HOSPITAL_COMMUNITY)
Admission: RE | Admit: 2024-04-25 | Discharge: 2024-04-25 | Disposition: A | Discharge status: Home / Self Care | Source: Ambulatory Visit | Attending: Nephrology | Admitting: Nephrology

## 2024-04-25 DIAGNOSIS — N184 Chronic kidney disease, stage 4 (severe): Secondary | ICD-10-CM | POA: Diagnosis not present

## 2024-04-25 DIAGNOSIS — N281 Cyst of kidney, acquired: Secondary | ICD-10-CM | POA: Insufficient documentation

## 2024-04-25 DIAGNOSIS — N1831 Chronic kidney disease, stage 3a: Secondary | ICD-10-CM | POA: Diagnosis not present

## 2024-04-25 DIAGNOSIS — N183 Chronic kidney disease, stage 3 unspecified: Secondary | ICD-10-CM | POA: Diagnosis not present

## 2024-04-26 DIAGNOSIS — I152 Hypertension secondary to endocrine disorders: Secondary | ICD-10-CM | POA: Diagnosis not present

## 2024-04-26 DIAGNOSIS — J449 Chronic obstructive pulmonary disease, unspecified: Secondary | ICD-10-CM | POA: Diagnosis not present

## 2024-04-26 DIAGNOSIS — N179 Acute kidney failure, unspecified: Secondary | ICD-10-CM | POA: Diagnosis not present

## 2024-04-26 DIAGNOSIS — Z6841 Body Mass Index (BMI) 40.0 and over, adult: Secondary | ICD-10-CM | POA: Diagnosis not present

## 2024-04-26 DIAGNOSIS — E1169 Type 2 diabetes mellitus with other specified complication: Secondary | ICD-10-CM | POA: Diagnosis not present

## 2024-04-26 DIAGNOSIS — Z794 Long term (current) use of insulin: Secondary | ICD-10-CM | POA: Diagnosis not present

## 2024-04-26 DIAGNOSIS — E785 Hyperlipidemia, unspecified: Secondary | ICD-10-CM | POA: Diagnosis not present

## 2024-04-26 DIAGNOSIS — A4151 Sepsis due to Escherichia coli [E. coli]: Secondary | ICD-10-CM | POA: Diagnosis not present

## 2024-04-26 DIAGNOSIS — E1122 Type 2 diabetes mellitus with diabetic chronic kidney disease: Secondary | ICD-10-CM | POA: Diagnosis not present

## 2024-04-26 DIAGNOSIS — E1159 Type 2 diabetes mellitus with other circulatory complications: Secondary | ICD-10-CM | POA: Diagnosis not present

## 2024-04-26 DIAGNOSIS — N1832 Chronic kidney disease, stage 3b: Secondary | ICD-10-CM | POA: Diagnosis not present

## 2024-04-26 DIAGNOSIS — N12 Tubulo-interstitial nephritis, not specified as acute or chronic: Secondary | ICD-10-CM | POA: Diagnosis not present

## 2024-05-01 ENCOUNTER — Other Ambulatory Visit: Payer: Self-pay | Admitting: *Deleted

## 2024-05-01 DIAGNOSIS — Z5986 Financial insecurity: Secondary | ICD-10-CM

## 2024-05-01 NOTE — Patient Instructions (Signed)
 Visit Information  Thank you for taking time to visit with me today. Please don't hesitate to contact me if I can be of assistance to you before our next scheduled appointment.  Your next care management appointment is by telephone on 05-29-2024 at 10:30 am  Telephone follow-up in 1 month  Please call the care guide team at [phone] if you need to cancel, schedule, or reschedule an appointment.   Please call the Suicide and Crisis Lifeline: 988 call the [place]  National Suicide Prevention Lifeline: 931-491-6814 or TTY: 314-570-9104 TTY 281-864-7354) to talk to a trained counselor call 1-800-273-TALK (toll free, 24 hour hotline) call the [facility]: (216)210-5272 call 911 if you are experiencing a Mental Health or Behavioral Health Crisis or need someone to talk to.  Rosina Forte, BSN RN [facility], [facility] RN Care Manager Direct Dial: [phone]  Fax: [phone]

## 2024-05-01 NOTE — Patient Outreach (Signed)
 Complex Care Management   Visit Note  05/01/2024  Name:  [name] MRN: [id number] DOB: 04-07-1961  Situation: Referral received for Complex Care Management related to Diabetes with Complications I obtained verbal consent from Patient.  Visit completed with patient  on the phone  Background:   Past Medical History:  Diagnosis Date   Arthritis    CAD (coronary artery disease)    BMS to circumflex 2007 - Dr. Obie   CKD (chronic kidney disease) stage 3, GFR 30-59 ml/min (HCC)    COPD (chronic obstructive pulmonary disease) (HCC)    Depression    Dyspnea    Essential hypertension    Falls frequently    GERD (gastroesophageal reflux disease)    HA (headache)    Hyperlipidemia    Hyperlipidemia associated with type 2 diabetes mellitus (HCC) 02/03/2011   Left-sided face pain    Morbid obesity (HCC)    Noncompliance    OSA (obstructive sleep apnea)    Uses CPAP   Type 2 diabetes mellitus (HCC)    Urinary incontinence    Urinary incontinence     Assessment: Patient Reported Symptoms:  Cognitive Cognitive Status: Able to follow simple commands, Alert and oriented to person, place, and time, Insightful and able to interpret abstract concepts, Normal speech and language skills Cognitive/Intellectual Conditions Management [RPT]: None reported or documented in medical history or problem list   Health Maintenance Behaviors: Annual physical exam Healing Pattern: Average Health Facilitated by: Rest  Neurological Neurological Review of Symptoms: Weakness Neurological Conditions: Headache Neurological Management Strategies: Routine screening  HEENT HEENT Symptoms Reported: Tearing, Tinnitus, Ear pain HEENT Conditions: Ear problem(s) Ear problem(s)  Cardiovascular Cardiovascular Symptoms Reported: Swelling in legs or feet Does patient have uncontrolled Hypertension?: No Cardiovascular Conditions: High blood cholesterol, Hypertension Cardiovascular Management Strategies:  Medication therapy, Routine screening  Respiratory Respiratory Symptoms Reported: No symptoms reported Respiratory Conditions: Shortness of breath  Endocrine Patient reports the following symptoms related to hypoglycemia or hyperglycemia : No symptoms reported Is patient diabetic?: Yes Is patient checking blood sugars at home?: No Endocrine Conditions: Diabetes Endocrine Management Strategies: Medication therapy, Routine screening  Gastrointestinal Gastrointestinal Symptoms Reported: Diarrhea, Abdominal pain or discomfort Gastrointestinal Conditions: Diarrhea, Abdominal pain Gastrointestinal Management Strategies: Medication therapy Nutrition Risk Screen (CP): No indicators present  Genitourinary Genitourinary Symptoms Reported: Incontinence Genitourinary Conditions: Incontinence Genitourinary Management Strategies: Incontinence garment/pad  Integumentary Integumentary Symptoms Reported: Other Other Integumentary Symptoms: Lymphedema Skin Management Strategies: Dressing changes  Musculoskeletal Musculoskelatal Symptoms Reviewed: Weakness Musculoskeletal Conditions: Mobility limited, Unsteady gait Musculoskeletal Management Strategies: Routine screening, Medication therapy Falls in the past year?: No Number of falls in past year: 1 or less Was there an injury with Fall?: No Fall Risk Category Calculator: 0 Patient Fall Risk Level: Low Fall Risk Patient at Risk for Falls Due to: No Fall Risks Fall risk Follow up: Falls evaluation completed  Psychosocial Psychosocial Symptoms Reported: No symptoms reported     Quality of Family Relationships: helpful, involved, supportive Do you feel physically threatened by others?: No      05/01/2024   10:54 AM  Depression screen PHQ 2/9  Decreased Interest 0  Down, Depressed, Hopeless 0  PHQ - 2 Score 0    There were no vitals filed for this visit.  Medications Reviewed Today     Reviewed by Bertrum Rosina HERO, RN (Registered Nurse) on  05/01/24 at 1044  Med List Status: <None>   Medication Order Taking? Sig Documenting Provider Last Dose Status Informant  acetaminophen  (TYLENOL )  325 MG tablet [id number] Yes Take 975 mg by mouth every 8 (eight) hours as needed for mild pain (pain score 1-3). [provider]  Active Self  albuterol  (VENTOLIN  HFA) 108 (90 Base) MCG/ACT inhaler [id number] Yes INHALE 2 PUFFS BY MOUTH EVERY 4 HOURS AS NEEDED FOR WHEEZING OR SHORTNESS OF BREATH (FOR RESCUE) Jolinda Norene HERO, DO  Active Self  Alpha-Lipoic Acid 600 MG CAPS [id number]  Take 1 capsule (600 mg total) by mouth daily. For diabetic neuropathy  Patient not taking: Reported on 05/01/2024   Jolinda Norene HERO, DO  Active Self  aspirin  EC 325 MG tablet [id number] Yes Take 325 mg by mouth daily. [provider]  Active Self  Blood Glucose Monitoring Suppl [facility] VERIO) w/Device KIT [id number] Yes Use to test blood sugar twice daily as directed; DX: E11.69 Jolinda Norene HERO, DO  Active Self  Continuous Glucose Sensor (FREESTYLE LIBRE 3 PLUS SENSOR) MISC [id number]  Change sensor every 15 days. DX: E11.65  Patient not taking: Reported on 05/01/2024   Jolinda Norene M, DO  Active Self  dicyclomine  (BENTYL ) 10 MG capsule [id number]  Take 1 capsule (10 mg total) by mouth 2 (two) times daily as needed for spasms.  Patient not taking: Reported on 05/01/2024   Kennedy Charmaine CROME, NP  Active   docusate sodium  (COLACE) 100 MG capsule [id number] Yes Take 1 capsule (100 mg total) by mouth 2 (two) times daily as needed for mild constipation. Jolinda Norene M, DO  Active   furosemide  (LASIX ) 40 MG tablet [id number] Yes TAKE 1 TABLET BY MOUTH ONCE DAILY AS NEEDED FOR EDEMA OR FLUID Jolinda Norene M, DO  Active   gabapentin  (NEURONTIN ) 600 MG tablet [id number] Yes TAKE 1 & 1/2 (ONE & ONE-HALF) TABLETS BY MOUTH THREE TIMES DAILY Jolinda Norene M, DO  Active   glucose blood (ONETOUCH VERIO) test strip [id number] Yes Use to test blood sugar  twice daily as directed; DX: E11.69 Jolinda Norene HERO, DO  Active Self  Insulin  Glargine (BASAGLAR  KWIKPEN) 100 UNIT/ML [id number] Yes Inject 45-50 Units into the skin at bedtime. May increase up to 50 units if instructed by your provider. Jolinda Norene HERO, DO  Active Self           Med Note (WARD, ANGELICA G   Wed Feb 15, 2024  5:26 PM) 45 last pm  Insulin  Pen Needle 29G X MISC [id number] Yes Use to inject insulin  daily as directed. DX:E11.65; please profile for future needs Jolinda Norene HERO, DO  Active Self  metoprolol  tartrate (LOPRESSOR ) 25 MG tablet [id number] Yes Take 1 tablet (25 mg total) by mouth 2 (two) times daily. Miriam Norris, NP  Active Self  nitroGLYCERIN  (NITROSTAT ) 0.4 MG SL tablet [id number] Yes Place 1 tablet (0.4 mg total) under the tongue every 5 (five) minutes x 3 doses as needed for chest pain (if no relief after 3rd dose, proceed to ED or call 911). Miriam Norris, NP  Active Self  omeprazole  (PRILOSEC) 40 MG capsule [id number] Yes Take 1 capsule (40 mg total) by mouth daily. For heartburn Jolinda Norene M, DO  Active   OneTouch Delica Lancets 30G MISC [id number] Yes Use to test blood sugar twice daily as directed; DX: E11.69 Jolinda Norene HERO, DO  Active Self  oxybutynin  (DITROPAN  XL) 5 MG 24 hr tablet [id number] Yes Take 1 tablet (5 mg total) by mouth at bedtime. For bladder Jolinda Norene HERO, DO  Active   oxyCODONE  (ROXICODONE ) 5 MG immediate release tablet [id number]  Take 1 tablet (5 mg total) by mouth every 6 (six) hours as needed for up to 10 doses for severe pain (pain score 7-10).  Patient not taking: Reported on 05/01/2024   Sira, Zackery, MD  Active   rosuvastatin  (CRESTOR ) 20 MG tablet [id number] Yes Take 1 tablet (20 mg total) by mouth at bedtime. Jolinda Norene HERO, DO  Active   Semaglutide  14 MG TABS [id number] Yes Take 14 mg by mouth daily. [provider]  Active Self           Med Note TENA, MLISS BIRCH   Tue Sep 22, 2021  3:08 PM) VIA  [facility] PATIENT ASSISTANCE PROGRAM            Recommendation:   Continue Current Plan of Care  Follow Up Plan:   Telephone follow-up in 1 month Referral to Pharmacist BSW Care Guide  Rosina Forte, BSN RN [facility], [facility] RN Care Manager Direct Dial: [phone]  Fax: [phone]

## 2024-05-02 ENCOUNTER — Telehealth: Payer: Self-pay

## 2024-05-02 DIAGNOSIS — Z794 Long term (current) use of insulin: Secondary | ICD-10-CM | POA: Diagnosis not present

## 2024-05-02 DIAGNOSIS — E1159 Type 2 diabetes mellitus with other circulatory complications: Secondary | ICD-10-CM | POA: Diagnosis not present

## 2024-05-02 DIAGNOSIS — N12 Tubulo-interstitial nephritis, not specified as acute or chronic: Secondary | ICD-10-CM | POA: Diagnosis not present

## 2024-05-02 DIAGNOSIS — A4151 Sepsis due to Escherichia coli [E. coli]: Secondary | ICD-10-CM | POA: Diagnosis not present

## 2024-05-02 DIAGNOSIS — N179 Acute kidney failure, unspecified: Secondary | ICD-10-CM | POA: Diagnosis not present

## 2024-05-02 DIAGNOSIS — I152 Hypertension secondary to endocrine disorders: Secondary | ICD-10-CM | POA: Diagnosis not present

## 2024-05-02 DIAGNOSIS — E785 Hyperlipidemia, unspecified: Secondary | ICD-10-CM | POA: Diagnosis not present

## 2024-05-02 DIAGNOSIS — N1832 Chronic kidney disease, stage 3b: Secondary | ICD-10-CM | POA: Diagnosis not present

## 2024-05-02 DIAGNOSIS — J449 Chronic obstructive pulmonary disease, unspecified: Secondary | ICD-10-CM | POA: Diagnosis not present

## 2024-05-02 DIAGNOSIS — E1169 Type 2 diabetes mellitus with other specified complication: Secondary | ICD-10-CM | POA: Diagnosis not present

## 2024-05-02 DIAGNOSIS — E1122 Type 2 diabetes mellitus with diabetic chronic kidney disease: Secondary | ICD-10-CM | POA: Diagnosis not present

## 2024-05-02 DIAGNOSIS — Z6841 Body Mass Index (BMI) 40.0 and over, adult: Secondary | ICD-10-CM | POA: Diagnosis not present

## 2024-05-02 NOTE — Progress Notes (Signed)
 Care Guide Pharmacy Note  05/02/2024 Name: [name] MRN: [id number] DOB: 08-Jul-1961  Referred By: Jolinda Norene HERO, DO Reason for referral: Complex Care Management (Outreach to schedule with Pharm d )   [name] is a 63 y.o. year old female who is a primary care patient of Jolinda Norene HERO, DO.  LARITA DEREMER was referred to the pharmacist for assistance related to: DMII  Successful contact was made with the patient to discuss pharmacy services including being ready for the pharmacist to call at least 5 minutes before the scheduled appointment time and to have medication bottles and any blood pressure readings ready for review. The patient agreed to meet with the pharmacist via telephone visit on (date/time).05/25/2024  Jeoffrey Buffalo , RMA     [place]  [facility], [facility] Guide  Direct Dial: [phone]  Website: [email or web address]

## 2024-05-03 ENCOUNTER — Other Ambulatory Visit: Payer: Self-pay

## 2024-05-04 ENCOUNTER — Other Ambulatory Visit: Payer: Self-pay

## 2024-05-04 NOTE — Patient Instructions (Signed)
 Visit Information  Thank you for taking time to visit with me today. Please don't hesitate to contact me if I can be of assistance to you before our next scheduled appointment.  Our next appointment is by telephone on 05/16/24 at 10am Please call the care guide team at [phone] if you need to cancel or reschedule your appointment.   Following is a copy of your care plan:   Goals Addressed             This Visit's Progress    BSW VBCI Social Work Care Plan       Problems:   Food Insecurity  and Medicaid, Incontinence Supplies, Utility assistance  CSW Clinical Goal(s):   Over the next 2 weeks the Patient will will follow up with community resources as directed by Social Work.  Interventions:  Social Determinants of Health in Patient with DMII: SDOH assessments completed: Food Insecurity  and Medical insurance, Incontinence Supplies and Utility assistance Evaluation of current treatment plan related to unmet needs SW refers patient to DSS for Medicaid/Energy assistance program, Food bank list will be mailed, referred to [employer] for Incontinence supplies.  Patient Goals/Self-Care Activities:  Patient will follow up with community resources and insurance agent to assist with Medicare plan review to determine options.  Plan:   Telephone follow up appointment with care management team member scheduled for:  05/16/24 at 10am        Please call 911 if you are experiencing a Mental Health or Behavioral Health Crisis or need someone to talk to.  Patient verbalizes understanding of instructions and care plan provided today and agrees to view in MyChart. Active MyChart status and patient understanding of how to access instructions and care plan via MyChart confirmed with patient.     Tillman Gardener, BSW [place]  [facility], [facility] Social Worker Direct Dial: [phone]  Fax: [phone] Website: [email or web address]

## 2024-05-04 NOTE — Patient Outreach (Signed)
 Complex Care Management   Visit Note  05/04/2024  Name:  [name] MRN: [id number] DOB: 13-Nov-1960  Situation: Referral received for Complex Care Management related to SDOH Barriers:  Food insecurity Medicaid, Incontinence Supplies, Utility assistance. I obtained verbal consent from Patient.  Visit completed with patient  on the phone  Background:   Past Medical History:  Diagnosis Date   Arthritis    CAD (coronary artery disease)    BMS to circumflex 2007 - Dr. Obie   CKD (chronic kidney disease) stage 3, GFR 30-59 ml/min (HCC)    COPD (chronic obstructive pulmonary disease) (HCC)    Depression    Dyspnea    Essential hypertension    Falls frequently    GERD (gastroesophageal reflux disease)    HA (headache)    Hyperlipidemia    Hyperlipidemia associated with type 2 diabetes mellitus (HCC) 02/03/2011   Left-sided face pain    Morbid obesity (HCC)    Noncompliance    OSA (obstructive sleep apnea)    Uses CPAP   Type 2 diabetes mellitus (HCC)    Urinary incontinence    Urinary incontinence     Assessment:  Patient reports her insurance plan does not cover incontinence supplies and OTC assistance.  Patient will communicate with her insurance agent to get more options on plans for open enrollment. Patient receives $23 in [place].  SW will mail foodbank list.  Patient is familiar with [facility] food bank.  Patient has higher power bills in the winter and she is paying late every month.  Sw refers patient to DSS to apply for the Energy assistance program.  Patient will also apply for Medicaid while at DSS.    SDOH Interventions    Flowsheet Row Patient Outreach Telephone from 05/04/2024 in [place] POPULATION [facility]ALTH DEPARTMENT Patient Outreach from 05/01/2024 in [place] POPULATION HEALTH DEPARTMENT Office Visit from 03/21/2024 in [facility] Telephone from 02/21/2024 in [place] POPULATION HEALTH DEPARTMENT Clinical Support from  01/10/2024 in [facility] Care Coordination from 12/08/2023 in [facility] Care Coordination  SDOH Interventions        Food Insecurity Interventions Other (Comment)  [Foodstamp $23 and food referral] AMB Referral -- Intervention Not Indicated Intervention Not Indicated Intervention Not Indicated  Housing Interventions Intervention Not Indicated Intervention Not Indicated -- Intervention Not Indicated Intervention Not Indicated Intervention Not Indicated  Transportation Interventions Intervention Not Indicated, Patient Resources Dietitian) Patient Resources (Friends/Family) -- Intervention Not Indicated, Patient Resources (Friends/Family) Intervention Not Indicated --  Utilities Interventions Other (Comment)  [Will contact DSS] AMB Referral -- Intervention Not Indicated Intervention Not Indicated --  Alcohol  Usage Interventions -- -- -- -- Intervention Not Indicated (Score <7) --  Depression Interventions/Treatment  -- -- Medication, Currently on Treatment -- Currently on Treatment --  Financial Strain Interventions Intervention Not Indicated -- -- -- Intervention Not Indicated --  Physical Activity Interventions -- -- -- -- Patient Declined --  Stress Interventions -- -- -- -- Intervention Not Indicated --  Social Connections Interventions -- -- -- -- Intervention Not Indicated --  Health Literacy Interventions -- -- -- -- Intervention Not Indicated --      Recommendation:   none  Follow Up Plan:   Telephone follow up appointment date/time:  05/16/24 at 10am  Tillman Gardener, BSW   [facility], [facility] Social Worker Direct Dial: [phone]  Fax: [phone] Website: [email or web address]

## 2024-05-07 ENCOUNTER — Ambulatory Visit: Admitting: Urology

## 2024-05-08 DIAGNOSIS — E1169 Type 2 diabetes mellitus with other specified complication: Secondary | ICD-10-CM | POA: Diagnosis not present

## 2024-05-08 DIAGNOSIS — J449 Chronic obstructive pulmonary disease, unspecified: Secondary | ICD-10-CM | POA: Diagnosis not present

## 2024-05-08 DIAGNOSIS — Z6841 Body Mass Index (BMI) 40.0 and over, adult: Secondary | ICD-10-CM | POA: Diagnosis not present

## 2024-05-08 DIAGNOSIS — E1159 Type 2 diabetes mellitus with other circulatory complications: Secondary | ICD-10-CM | POA: Diagnosis not present

## 2024-05-08 DIAGNOSIS — E1122 Type 2 diabetes mellitus with diabetic chronic kidney disease: Secondary | ICD-10-CM | POA: Diagnosis not present

## 2024-05-08 DIAGNOSIS — I152 Hypertension secondary to endocrine disorders: Secondary | ICD-10-CM | POA: Diagnosis not present

## 2024-05-08 DIAGNOSIS — E785 Hyperlipidemia, unspecified: Secondary | ICD-10-CM | POA: Diagnosis not present

## 2024-05-08 DIAGNOSIS — N1832 Chronic kidney disease, stage 3b: Secondary | ICD-10-CM | POA: Diagnosis not present

## 2024-05-08 DIAGNOSIS — N179 Acute kidney failure, unspecified: Secondary | ICD-10-CM | POA: Diagnosis not present

## 2024-05-08 DIAGNOSIS — Z794 Long term (current) use of insulin: Secondary | ICD-10-CM | POA: Diagnosis not present

## 2024-05-08 DIAGNOSIS — N12 Tubulo-interstitial nephritis, not specified as acute or chronic: Secondary | ICD-10-CM | POA: Diagnosis not present

## 2024-05-08 DIAGNOSIS — A4151 Sepsis due to Escherichia coli [E. coli]: Secondary | ICD-10-CM | POA: Diagnosis not present

## 2024-05-15 ENCOUNTER — Other Ambulatory Visit: Payer: Self-pay | Admitting: Family Medicine

## 2024-05-15 ENCOUNTER — Other Ambulatory Visit: Payer: Self-pay | Admitting: Nurse Practitioner

## 2024-05-15 DIAGNOSIS — E1159 Type 2 diabetes mellitus with other circulatory complications: Secondary | ICD-10-CM

## 2024-05-15 DIAGNOSIS — I10 Essential (primary) hypertension: Secondary | ICD-10-CM

## 2024-05-16 ENCOUNTER — Other Ambulatory Visit: Payer: Self-pay

## 2024-05-16 DIAGNOSIS — E1169 Type 2 diabetes mellitus with other specified complication: Secondary | ICD-10-CM | POA: Diagnosis not present

## 2024-05-16 DIAGNOSIS — Z6841 Body Mass Index (BMI) 40.0 and over, adult: Secondary | ICD-10-CM | POA: Diagnosis not present

## 2024-05-16 DIAGNOSIS — E1122 Type 2 diabetes mellitus with diabetic chronic kidney disease: Secondary | ICD-10-CM | POA: Diagnosis not present

## 2024-05-16 DIAGNOSIS — Z794 Long term (current) use of insulin: Secondary | ICD-10-CM | POA: Diagnosis not present

## 2024-05-16 DIAGNOSIS — A4151 Sepsis due to Escherichia coli [E. coli]: Secondary | ICD-10-CM | POA: Diagnosis not present

## 2024-05-16 DIAGNOSIS — N179 Acute kidney failure, unspecified: Secondary | ICD-10-CM | POA: Diagnosis not present

## 2024-05-16 DIAGNOSIS — N1832 Chronic kidney disease, stage 3b: Secondary | ICD-10-CM | POA: Diagnosis not present

## 2024-05-16 DIAGNOSIS — E785 Hyperlipidemia, unspecified: Secondary | ICD-10-CM | POA: Diagnosis not present

## 2024-05-16 DIAGNOSIS — E1159 Type 2 diabetes mellitus with other circulatory complications: Secondary | ICD-10-CM | POA: Diagnosis not present

## 2024-05-16 DIAGNOSIS — J449 Chronic obstructive pulmonary disease, unspecified: Secondary | ICD-10-CM | POA: Diagnosis not present

## 2024-05-16 DIAGNOSIS — I152 Hypertension secondary to endocrine disorders: Secondary | ICD-10-CM | POA: Diagnosis not present

## 2024-05-16 DIAGNOSIS — N12 Tubulo-interstitial nephritis, not specified as acute or chronic: Secondary | ICD-10-CM | POA: Diagnosis not present

## 2024-05-16 NOTE — Patient Outreach (Signed)
 Complex Care Management   Visit Note  05/16/2024  Name:  [name] MRN: [id number] DOB: 03-Feb-1961  Situation: Referral received for Complex Care Management related to community resources I obtained verbal consent from Patient.  Visit completed with patient  on the phone  Background:   Past Medical History:  Diagnosis Date   Arthritis    CAD (coronary artery disease)    BMS to circumflex 2007 - Dr. Obie   CKD (chronic kidney disease) stage 3, GFR 30-59 ml/min (HCC)    COPD (chronic obstructive pulmonary disease) (HCC)    Depression    Dyspnea    Essential hypertension    Falls frequently    GERD (gastroesophageal reflux disease)    HA (headache)    Hyperlipidemia    Hyperlipidemia associated with type 2 diabetes mellitus (HCC) 02/03/2011   Left-sided face pain    Morbid obesity (HCC)    Noncompliance    OSA (obstructive sleep apnea)    Uses CPAP   Type 2 diabetes mellitus (HCC)    Urinary incontinence    Urinary incontinence     Assessment:  Patient reports she has not connected with the [employer] for incontinence supplies.  Patient feels she can manage her summer power bill and will waiting until the winter to apply with DSS for Energy Assistance.  Patient does not want to pursue foodstamps and feels she is not eligible for an increase.  Patient did receive the food bank list in the mail.  Patient does not want a follow up call.  SDOH Interventions    Flowsheet Row Patient Outreach Telephone from 05/04/2024 in [place] POPULATION HEALTH DEPARTMENT Patient Outreach from 05/01/2024 in [place] POPULATION HEALTH DEPARTMENT Office Visit from 03/21/2024 in [facility] Telephone from 02/21/2024 in [place] POPULATION HEALTH DEPARTMENT Clinical Support from 01/10/2024 in [facility] Care Coordination from 12/08/2023 in [facility] Care Coordination  SDOH Interventions        Food  Insecurity Interventions Other (Comment)  [Foodstamp $23 and food referral] AMB Referral -- Intervention Not Indicated Intervention Not Indicated Intervention Not Indicated  Housing Interventions Intervention Not Indicated Intervention Not Indicated -- Intervention Not Indicated Intervention Not Indicated Intervention Not Indicated  Transportation Interventions Intervention Not Indicated, Patient Resources Dietitian) Patient Resources (Friends/Family) -- Intervention Not Indicated, Patient Resources Dietitian) Intervention Not Indicated --  Utilities Interventions Other (Comment)  [Will contact DSS] AMB Referral -- Intervention Not Indicated Intervention Not Indicated --  Alcohol  Usage Interventions -- -- -- -- Intervention Not Indicated (Score <7) --  Depression Interventions/Treatment  -- -- Medication, Currently on Treatment -- Currently on Treatment --  Financial Strain Interventions Intervention Not Indicated -- -- -- Intervention Not Indicated --  Physical Activity Interventions -- -- -- -- Patient Declined --  Stress Interventions -- -- -- -- Intervention Not Indicated --  Social Connections Interventions -- -- -- -- Intervention Not Indicated --  Health Literacy Interventions -- -- -- -- Intervention Not Indicated --      Recommendation:   None  Follow Up Plan:   Patient has met all care management goals. Care Management case will be closed. Patient has been provided contact information should new needs arise.   Tillman Gardener, BSW [place]  [facility], [facility] Social Worker Direct Dial: [phone]  Fax: [phone] Website: [email or web address]

## 2024-05-16 NOTE — Patient Instructions (Signed)

## 2024-05-18 ENCOUNTER — Telehealth: Payer: Self-pay | Admitting: Family Medicine

## 2024-05-18 ENCOUNTER — Telehealth: Payer: Self-pay | Admitting: Cardiology

## 2024-05-18 DIAGNOSIS — I10 Essential (primary) hypertension: Secondary | ICD-10-CM

## 2024-05-18 MED ORDER — METOPROLOL TARTRATE 25 MG PO TABS: 25.0000 mg | ORAL_TABLET | Freq: Two times a day (BID) | ORAL | 0 refills | Status: DC

## 2024-05-18 NOTE — Telephone Encounter (Signed)
*  STAT* If patient is at the pharmacy, call can be transferred to refill team.   1. Which medications need to be refilled? (please list name of each medication and dose if known) metoprolol  tartrate (LOPRESSOR ) 25 MG tablet    2. Would you like to learn more about the convenience, safety, & potential cost savings by using the [facility]?     3. Are you open to using the [facility] (Type [facility].  ).   4. Which pharmacy/location (including street and city if local pharmacy) is medication to be sent to? [facility] - [place], [place] - [address]    5. Do they need a 30 day or 90 day supply? 90 day    Pt out of medication

## 2024-05-18 NOTE — Telephone Encounter (Signed)
**   Nikki with [facility] Calling    Copied from CRM [id number]. Topic: Clinical - Medication Refill >> May 18, 2024  9:10 AM Carrielelia G wrote: Medication: rosuvastatin  (CRESTOR ) 20 MG tablet  Has the patient contacted their pharmacy? No (Agent: If no, request that the patient contact the pharmacy for the refill. If patient does not wish to contact the pharmacy document the reason why and proceed with request.) (Agent: If yes, when and what did the pharmacy advise?)  This is the patient's preferred pharmacy:  [facility] - [place], [place] - [address] [place] [address] Phone: [phone] Fax: [phone]

## 2024-05-18 NOTE — Telephone Encounter (Signed)
 Pharmacy Rep confirmed refill available and will start process of refill.

## 2024-05-18 NOTE — Telephone Encounter (Signed)
Noted, will close encounter. 

## 2024-05-18 NOTE — Telephone Encounter (Signed)
 Refills has been sent to the pharmacy.

## 2024-05-23 DIAGNOSIS — A4151 Sepsis due to Escherichia coli [E. coli]: Secondary | ICD-10-CM | POA: Diagnosis not present

## 2024-05-23 DIAGNOSIS — J449 Chronic obstructive pulmonary disease, unspecified: Secondary | ICD-10-CM | POA: Diagnosis not present

## 2024-05-23 DIAGNOSIS — E1169 Type 2 diabetes mellitus with other specified complication: Secondary | ICD-10-CM | POA: Diagnosis not present

## 2024-05-23 DIAGNOSIS — N179 Acute kidney failure, unspecified: Secondary | ICD-10-CM | POA: Diagnosis not present

## 2024-05-23 DIAGNOSIS — I152 Hypertension secondary to endocrine disorders: Secondary | ICD-10-CM | POA: Diagnosis not present

## 2024-05-23 DIAGNOSIS — E785 Hyperlipidemia, unspecified: Secondary | ICD-10-CM | POA: Diagnosis not present

## 2024-05-23 DIAGNOSIS — E1122 Type 2 diabetes mellitus with diabetic chronic kidney disease: Secondary | ICD-10-CM | POA: Diagnosis not present

## 2024-05-23 DIAGNOSIS — Z794 Long term (current) use of insulin: Secondary | ICD-10-CM | POA: Diagnosis not present

## 2024-05-23 DIAGNOSIS — N1832 Chronic kidney disease, stage 3b: Secondary | ICD-10-CM | POA: Diagnosis not present

## 2024-05-23 DIAGNOSIS — Z6841 Body Mass Index (BMI) 40.0 and over, adult: Secondary | ICD-10-CM | POA: Diagnosis not present

## 2024-05-23 DIAGNOSIS — E1159 Type 2 diabetes mellitus with other circulatory complications: Secondary | ICD-10-CM | POA: Diagnosis not present

## 2024-05-23 DIAGNOSIS — N12 Tubulo-interstitial nephritis, not specified as acute or chronic: Secondary | ICD-10-CM | POA: Diagnosis not present

## 2024-05-24 ENCOUNTER — Ambulatory Visit: Payer: Self-pay | Attending: Cardiology | Admitting: Cardiology

## 2024-05-24 ENCOUNTER — Encounter: Payer: Self-pay | Admitting: Cardiology

## 2024-05-24 VITALS — BP 128/64 | HR 65 | Ht 62.0 in | Wt 288.6 lb

## 2024-05-24 DIAGNOSIS — N1832 Chronic kidney disease, stage 3b: Secondary | ICD-10-CM | POA: Diagnosis not present

## 2024-05-24 DIAGNOSIS — I10 Essential (primary) hypertension: Secondary | ICD-10-CM

## 2024-05-24 DIAGNOSIS — I25119 Atherosclerotic heart disease of native coronary artery with unspecified angina pectoris: Secondary | ICD-10-CM | POA: Diagnosis not present

## 2024-05-24 DIAGNOSIS — I890 Lymphedema, not elsewhere classified: Secondary | ICD-10-CM | POA: Diagnosis not present

## 2024-05-24 DIAGNOSIS — E782 Mixed hyperlipidemia: Secondary | ICD-10-CM

## 2024-05-24 NOTE — Patient Instructions (Addendum)

## 2024-05-24 NOTE — Progress Notes (Signed)
    Cardiology Office Note  Date: 05/24/2024   ID: [name], DOB 16-Mar-1961, MRN [id number]  History of Present Illness: [name] is a 63 y.o. female last seen in June 2024 by Ms. Peck NP, I reviewed her note (I have not seen her since 2019).  She is here today with her daughter-in-law for a follow-up visit.  She does not report any increasing angina, no interval nitroglycerin  use.  She continues to follow with Dr. Jolinda for primary care.  We went over her medications.  Discussed reducing her aspirin  to 81 mg daily at this point, she states that she refills her nitroglycerin  once a year.  Blood pressure is reasonably controlled today.  She states that she will have lab work with PCP later this year.  Last LDL was 76.  I reviewed her ECG from April.  Physical Exam: VS:  BP 128/64 (BP Location: Left Arm)   Pulse 65   Ht 5' 2 (1.575 m)   Wt 288 lb 9.6 oz (130.9 kg)   SpO2 96%   BMI 52.79 kg/m , BMI Body mass index is 52.79 kg/m.  Wt Readings from Last 3 Encounters:  05/24/24 288 lb 9.6 oz (130.9 kg)  03/28/24 296 lb 9.6 oz (134.5 kg)  03/21/24 296 lb (134.3 kg)    General: Patient appears comfortable at rest. HEENT: Conjunctiva and lids normal. Neck: Supple, no elevated JVP or carotid bruits. Lungs: Decreased breath sounds, nonlabored breathing at rest. Cardiac: Regular rate and rhythm, no S3 or significant systolic murmur. Extremities: Chronic appearing lymphedema.  ECG:  An ECG dated 02/15/2024 was personally reviewed today and demonstrated:  Sinus rhythm with right bundle branch block and left posterior fascicular block.  Labwork: 02/15/2024: ALT 15; AST 25; Magnesium 2.0 02/28/2024: BUN 11; Creatinine, Ser 1.32; Hemoglobin 13.2; Platelets 167; Potassium 3.6; Sodium 141     Component Value Date/Time   CHOL 151 08/09/2022 0956   TRIG 224 (H) 08/09/2022 0956   TRIG 211 (H) 12/30/2014 1537   HDL 38 (L) 08/09/2022 0956   HDL 48 12/30/2014 1537   CHOLHDL 4.0  08/09/2022 0956   CHOLHDL 4.5 04/09/2013 1135   VLDL 37 04/09/2013 1135   LDLCALC 76 08/09/2022 0956   LDLCALC 42 08/28/2014 1037   LDLDIRECT 52 01/12/2022 1452   Other Studies Reviewed Today:  No interval cardiac testing for review today.  Assessment and Plan:  1.  CAD status post BMS to circumflex in 2007.  Follow-up Lexiscan  Myoview  in May 2024 suggested inferolateral infarct scar with mild peri-infarct ischemia, overall low risk.  LVEF 55 to 60% by echocardiogram at that time.  Plan to continue medical therapy in the absence of increasing angina.  Continue aspirin  81 mg daily, Crestor  20 mg daily, and as needed nitroglycerin .  2.  Primary hypertension.  Blood pressure control adequate today.  Continue Lopressor  25 mg twice daily.  3.  Mixed hyperlipidemia.  Follow-up lab work this year with PCP.  Last LDL was 76.  She is on Crestor  20 mg daily.  4.  CKD stage IIIb.  Creatinine 1.32 with GFR 45 in April.  Now following with nephrology.  5.  Chronic lymphedema.  She is on Lasix  40 mg daily  6.  OSA, not able to tolerate CPAP.  Disposition:  Follow up 1 year.  Signed, Jayson JUDITHANN Sierras, M.D., F.A.C.C. [facility]

## 2024-05-25 ENCOUNTER — Other Ambulatory Visit

## 2024-05-29 ENCOUNTER — Other Ambulatory Visit: Payer: Self-pay | Admitting: *Deleted

## 2024-05-29 ENCOUNTER — Encounter: Payer: Self-pay | Admitting: *Deleted

## 2024-05-29 NOTE — Patient Instructions (Signed)
 Visit Information  Thank you for taking time to visit with me today. Please don't hesitate to contact me if I can be of assistance to you before our next scheduled appointment.  Your next care management appointment is by telephone on 06-28-2024 at 10:30 AM   Telephone follow-up in 1 month  Please call the care guide team at [phone] if you need to cancel, schedule, or reschedule an appointment.   Please call the Suicide and Crisis Lifeline: 988 call the [place]  National Suicide Prevention Lifeline: 970 548 4318 or TTY: 281-295-7694 TTY 540-478-9372) to talk to a trained counselor call 1-800-273-TALK (toll free, 24 hour hotline) call 911 if you are experiencing a Mental Health or Behavioral Health Crisis or need someone to talk to.  Hendricks Her RN, BSN  [place] [facility]-Population Health RN Case Information systems manager [phone]

## 2024-05-29 NOTE — Patient Outreach (Signed)
 Complex Care Management   Visit Note  05/29/2024  Name:  [name] MRN: [id number] DOB: [birth year]  Situation: Referral received for Complex Care Management related to Diabetes with Complications I obtained verbal consent from Patient.  Visit completed with patient  on the phone  Background:   Past Medical History:  Diagnosis Date   Arthritis    CAD (coronary artery disease)    BMS to circumflex 2007 - Dr. Obie   CKD (chronic kidney disease) stage 3, GFR 30-59 ml/min (HCC)    COPD (chronic obstructive pulmonary disease) (HCC)    Depression    Dyspnea    Essential hypertension    Falls frequently    GERD (gastroesophageal reflux disease)    HA (headache)    Hyperlipidemia    Hyperlipidemia associated with type 2 diabetes mellitus (HCC) 02/03/2011   Left-sided face pain    Morbid obesity (HCC)    Noncompliance    OSA (obstructive sleep apnea)    Uses CPAP   Type 2 diabetes mellitus (HCC)    Urinary incontinence    Urinary incontinence     Assessment: Patient Reported Symptoms:  Cognitive Cognitive Status: No symptoms reported Cognitive/Intellectual Conditions Management [RPT]: None reported or documented in medical history or problem list   Health Maintenance Behaviors: Annual physical exam Healing Pattern: Average Health Facilitated by: Rest  Neurological Neurological Review of Symptoms: Weakness Neurological Management Strategies: Routine screening Neurological Self-Management Outcome: 3 (uncertain)  HEENT HEENT Symptoms Reported: Mouth dryness, Tearing HEENT Management Strategies: Routine screening HEENT Self-Management Outcome: 3 (uncertain)    Cardiovascular Cardiovascular Symptoms Reported: Other: (Weak  Doesn't feel like doing anything) Does patient have uncontrolled Hypertension?: No Cardiovascular Management Strategies: Routine screening, Medication therapy Cardiovascular Self-Management Outcome: 4 (good)  Respiratory Respiratory Symptoms Reported: No  symptoms reported Respiratory Management Strategies: Routine screening Respiratory Self-Management Outcome: 4 (good)  Endocrine Endocrine Symptoms Reported: Increased thirst Is patient diabetic?: Yes Is patient checking blood sugars at home?: No Endocrine Self-Management Outcome: 3 (uncertain)  Gastrointestinal Gastrointestinal Symptoms Reported: Diarrhea Gastrointestinal Management Strategies: Coping strategies Gastrointestinal Self-Management Outcome: 3 (uncertain) Nutrition Risk Screen (CP): No indicators present  Genitourinary Genitourinary Symptoms Reported: Incontinence Genitourinary Management Strategies: Incontinence garment/pad Genitourinary Self-Management Outcome: 4 (good)  Integumentary Integumentary Symptoms Reported: No symptoms reported Skin Management Strategies: Routine screening, Dressing changes Skin Self-Management Outcome: 4 (good) Skin Comment: Lymphedema wraps by home health  Musculoskeletal Musculoskelatal Symptoms Reviewed: Weakness, Back pain, Unsteady gait Musculoskeletal Management Strategies: Medical device, Routine screening Musculoskeletal Self-Management Outcome: 3 (uncertain) Falls in the past year?: No Number of falls in past year: 1 or less Was there an injury with Fall?: No Fall Risk Category Calculator: 0 Patient Fall Risk Level: Low Fall Risk Patient at Risk for Falls Due to: Impaired balance/gait, No Fall Risks Fall risk Follow up: Falls evaluation completed  Psychosocial Psychosocial Symptoms Reported: No symptoms reported Behavioral Management Strategies: Support system Behavioral Health Self-Management Outcome: 4 (good) Major Change/Loss/Stressor/Fears (CP): Denies Techniques to Cope with Loss/Stress/Change: Not applicable Quality of Family Relationships: supportive Do you feel physically threatened by others?: No      05/29/2024   10:48 AM  Depression screen PHQ 2/9  Decreased Interest 0  Down, Depressed, Hopeless 0  PHQ - 2 Score 0     There were no vitals filed for this visit.  Medications Reviewed Today     Reviewed by Kay Hendricks MATSU, RN (Case Manager) on 05/29/24 at 1100  Med List Status: <None>   Medication Order Taking? Sig  Documenting Provider Last Dose Status Informant  acetaminophen  (TYLENOL ) 325 MG tablet [id number] Yes Take 975 mg by mouth every 8 (eight) hours as needed for mild pain (pain score 1-3). [provider]  Active Self  albuterol  (VENTOLIN  HFA) 108 (90 Base) MCG/ACT inhaler [id number] Yes INHALE 2 PUFFS BY MOUTH EVERY 4 HOURS AS NEEDED FOR WHEEZING OR SHORTNESS OF BREATH (FOR RESCUE) Jolinda Norene HERO, DO  Active Self  aspirin  EC 325 MG tablet [id number] Yes Take 325 mg by mouth daily. [provider]  Active Self  Blood Glucose Monitoring Suppl [facility] VERIO) w/Device KIT [id number] Yes Use to test blood sugar twice daily as directed; DX: E11.69 Jolinda Norene HERO, DO  Active Self  Continuous Glucose Sensor (FREESTYLE LIBRE 3 PLUS SENSOR) MISC [id number]  Change sensor every 15 days. DX: E11.65  Patient not taking: Reported on 05/29/2024   Jolinda Norene M, DO  Active Self  docusate sodium  (COLACE) 100 MG capsule [id number] Yes Take 1 capsule (100 mg total) by mouth 2 (two) times daily as needed for mild constipation. Jolinda Norene M, DO  Active   furosemide  (LASIX ) 40 MG tablet [id number] Yes TAKE 1 TABLET BY MOUTH ONCE DAILY AS NEEDED FOR EDEMA OR  FLUID Jolinda Norene M, DO  Active   gabapentin  (NEURONTIN ) 600 MG tablet [id number] Yes TAKE 1 & 1/2 (ONE & ONE-HALF) TABLETS BY MOUTH THREE TIMES DAILY Jolinda Norene M, DO  Active   glucose blood (ONETOUCH VERIO) test strip [id number] Yes Use to test blood sugar twice daily as directed; DX: E11.69 Jolinda Norene HERO, DO  Active Self  Insulin  Glargine (BASAGLAR  KWIKPEN) 100 UNIT/ML [id number] Yes Inject 45-50 Units into the skin at bedtime. May increase up to 50 units if instructed by your provider. Jolinda Norene HERO, DO   Active Self           Med Note (WARD, ANGELICA G   Wed Feb 15, 2024  5:26 PM) 45 last pm  Insulin  Pen Needle 29G X MISC [id number] Yes Use to inject insulin  daily as directed. DX:E11.65; please profile for future needs Jolinda Norene M, DO  Active Self  metoprolol  tartrate (LOPRESSOR ) 25 MG tablet [id number] Yes Take 1 tablet (25 mg total) by mouth 2 (two) times daily. KEEP OV. Miriam Faun Mcqueen, NP  Active   nitroGLYCERIN  (NITROSTAT ) 0.4 MG SL tablet [id number] Yes Place 1 tablet (0.4 mg total) under the tongue every 5 (five) minutes x 3 doses as needed for chest pain (if no relief after 3rd dose, proceed to ED or call 911). Miriam Shella Lahman, NP  Active Self  omeprazole  (PRILOSEC) 40 MG capsule [id number] Yes Take 1 capsule (40 mg total) by mouth daily. For heartburn Jolinda Norene M, DO  Active   OneTouch Delica Lancets 30G MISC [id number] Yes Use to test blood sugar twice daily as directed; DX: E11.69 Jolinda Norene HERO, DO  Active Self  oxybutynin  (DITROPAN  XL) 5 MG 24 hr tablet [id number] Yes Take 1 tablet (5 mg total) by mouth at bedtime. For bladder Jolinda Norene M, DO  Active   oxyCODONE  (ROXICODONE ) 5 MG immediate release tablet [id number] Yes Take 1 tablet (5 mg total) by mouth every 6 (six) hours as needed for up to 10 doses for severe pain (pain score 7-10). Sira, Zackery, MD  Active   rosuvastatin  (CRESTOR ) 20 MG tablet [id number] Yes Take 1 tablet (20 mg total) by mouth at bedtime. Jolinda Norene HERO, DO  Active   Semaglutide  14 MG TABS [id number] Yes Take  14 mg by mouth daily. [provider]  Active Self           Med Note TENA, MLISS BIRCH   Tue Sep 22, 2021  3:08 PM) VIA [facility]OVO [facility] PATIENT ASSISTANCE PROGRAM            Recommendation:   Continue Current Plan of Care  Follow Up Plan:   Telephone follow-up in 1 month  Hendricks Her RN, BSN  [place] VBCI-Population Health RN Case Manager   Direct [phone]

## 2024-05-30 DIAGNOSIS — N1832 Chronic kidney disease, stage 3b: Secondary | ICD-10-CM | POA: Diagnosis not present

## 2024-05-30 DIAGNOSIS — E1142 Type 2 diabetes mellitus with diabetic polyneuropathy: Secondary | ICD-10-CM | POA: Diagnosis not present

## 2024-05-30 DIAGNOSIS — E1122 Type 2 diabetes mellitus with diabetic chronic kidney disease: Secondary | ICD-10-CM | POA: Diagnosis not present

## 2024-05-30 DIAGNOSIS — I890 Lymphedema, not elsewhere classified: Secondary | ICD-10-CM | POA: Diagnosis not present

## 2024-05-30 DIAGNOSIS — Z87448 Personal history of other diseases of urinary system: Secondary | ICD-10-CM | POA: Insufficient documentation

## 2024-05-30 DIAGNOSIS — J449 Chronic obstructive pulmonary disease, unspecified: Secondary | ICD-10-CM | POA: Diagnosis not present

## 2024-05-30 DIAGNOSIS — N390 Urinary tract infection, site not specified: Secondary | ICD-10-CM | POA: Insufficient documentation

## 2024-05-30 DIAGNOSIS — Z6841 Body Mass Index (BMI) 40.0 and over, adult: Secondary | ICD-10-CM | POA: Diagnosis not present

## 2024-05-30 DIAGNOSIS — E785 Hyperlipidemia, unspecified: Secondary | ICD-10-CM | POA: Diagnosis not present

## 2024-05-30 DIAGNOSIS — Z794 Long term (current) use of insulin: Secondary | ICD-10-CM | POA: Diagnosis not present

## 2024-05-30 DIAGNOSIS — I159 Secondary hypertension, unspecified: Secondary | ICD-10-CM | POA: Diagnosis not present

## 2024-05-30 DIAGNOSIS — E1159 Type 2 diabetes mellitus with other circulatory complications: Secondary | ICD-10-CM | POA: Diagnosis not present

## 2024-05-30 DIAGNOSIS — D696 Thrombocytopenia, unspecified: Secondary | ICD-10-CM | POA: Diagnosis not present

## 2024-05-30 DIAGNOSIS — E1169 Type 2 diabetes mellitus with other specified complication: Secondary | ICD-10-CM | POA: Diagnosis not present

## 2024-05-30 HISTORY — DX: Personal history of other diseases of urinary system: Z87.448

## 2024-05-30 NOTE — Progress Notes (Deleted)
 Name: [name] DOB: 1961/03/06 MRN: [id number]  History of Present Illness: [name] is a 63 y.o. female who presents today as a new patient at [facility]. All available relevant medical records have been reviewed.   She reports chief complaint of recurrent UTls.  Urine culture results in past 12 months: - 05/24/2023: Positive for E. coli - 12/09/2023: Positive for E. coli - 01/26/2024: Positive for E. coli - 02/14/2024: Positive for E. coli - 02/15/2024: Positive for E. coli; admitted for UTI and pyelonephritis  - 02/28/2024: Negative   Urinary Symptoms: She reports *** UTl's in the last year. When present, UTI symptoms include ***dysuria, ***increased urinary urgency, ***frequency, ***.  She reports that UTI symptoms do ***not seem to correlate with intercourse.  She {Actions; denies-reports:120008} acute UTI symptoms today.  At baseline: She {Actions; denies-reports:120008} urinary urgency, frequency, dysuria, gross hematuria, hesitancy, straining to void, or sensations of incomplete emptying. She reports voiding *** times per day and *** times at night. She {Actions; denies-reports:120008} pushing on a bulge in order to empty her bladder.  She {Actions; denies-reports:120008} urge incontinence. She {Actions; denies-reports:120008} stress incontinence with ***cough/***laugh/***sneeze/***heavy lifting/***exercise. She {Actions; denies-reports:120008} enuresis. She leaks *** times per ***. Wears *** ***pads / ***diapers per day. She states the ***SUI / ***UUI is predominant. This has been going on for {NUMBERS 1-12:18279} {days/wks/mos/yrs:310907} and {ACTION; IS/IS WNU:78978602} significantly bothersome. In terms of treatment, She has tried ***.  She {Actions; denies-reports:120008} caffeine intake.  She {Actions; denies-reports:120008} history of pyelonephritis.  She {Actions; denies-reports:120008} history of kidney stones.  Vaginal / prolapse  Symptoms: She {Actions; denies-reports:120008} vaginal bulge sensation.  She {Actions; denies-reports:120008} seeing a vaginal bulge. This bulge is bothersome and is the size of ***. It was first noticed ***.  She {Actions; denies-reports:120008} vaginal pain, bleeding, or discharge.  She {Actions; denies-reports:120008} use of topical vaginal estrogen cream.  Bowel Symptoms: She has a continent bowel movement *** time(s) per ***. Typical Bristol stool scale of continent bowel episodes is Type ***. She {Actions; denies-reports:120008} Fl episodes. Fecal incontinence started *** and occurs *** times per week. Typical Bristol stool scale of incontinent bowel episodes is Type ***. She {Actions; denies-reports:120008} straining and {Actions; denies-reports:120008} splinting to defecate. She {Actions; denies-reports:120008} rectal bleeding. She {Actions; denies-reports:120008} feeling like she fully empties her rectum with defecation. She {Actions; denies-reports:120008} urgency with defecation. She {Actions; denies-reports:120008} difficulty wiping clean. She {Actions; denies-reports:120008} taking laxatives, stool softeners, or fiber supplements. Last colonoscopy was***.  Past OB/GYN History: OB History   No obstetric history on file.    She {Actions; denies-reports:120008} being sexually active.  She {Actions; denies-reports:120008} dyspareunia. ***Dyspareunia is located ***at the introitus / ***deep inside the vagina. She has *** child(ren) who were delivered ***vaginally ***via c-section. She {Actions; denies-reports:120008} significant tearing with that ***delivery / those ***deliveries. She is {DESC; PRE/POST:32304} menopausal.  Last pap smear was ***. She {Actions; denies-reports:120008} history of *** hysterectomy.  Medications: Current Outpatient Medications  Medication Sig Dispense Refill   acetaminophen  (TYLENOL ) 325 MG tablet Take 975 mg by mouth every 8 (eight) hours as needed  for mild pain (pain score 1-3).     albuterol  (VENTOLIN  HFA) 108 (90 Base) MCG/ACT inhaler INHALE 2 PUFFS BY MOUTH EVERY 4 HOURS AS NEEDED FOR WHEEZING OR SHORTNESS OF BREATH (FOR RESCUE) 9 g 1   aspirin  EC 325 MG tablet Take 325 mg by mouth daily.     Blood Glucose Monitoring Suppl (ONETOUCH VERIO) w/Device KIT Use to test blood  sugar twice daily as directed; DX: E11.69 1 kit 1   Continuous Glucose Sensor (FREESTYLE LIBRE 3 PLUS SENSOR) MISC Change sensor every 15 days. DX: E11.65 (Patient not taking: Reported on 05/29/2024) 2 each 11   docusate sodium  (COLACE) 100 MG capsule Take 1 capsule (100 mg total) by mouth 2 (two) times daily as needed for mild constipation. 100 capsule 0   furosemide  (LASIX ) 40 MG tablet TAKE 1 TABLET BY MOUTH ONCE DAILY AS NEEDED FOR EDEMA OR  FLUID 90 tablet 0   gabapentin  (NEURONTIN ) 600 MG tablet TAKE 1 & 1/2 (ONE & ONE-HALF) TABLETS BY MOUTH THREE TIMES DAILY 270 tablet 12   glucose blood (ONETOUCH VERIO) test strip Use to test blood sugar twice daily as directed; DX: E11.69 100 each 5   Insulin  Glargine (BASAGLAR  KWIKPEN) 100 UNIT/ML Inject 45-50 Units into the skin at bedtime. May increase up to 50 units if instructed by your provider. 45 mL 5   Insulin  Pen Needle 29G X MISC Use to inject insulin  daily as directed. DX:E11.65; please profile for future needs 300 each 5   metoprolol  tartrate (LOPRESSOR ) 25 MG tablet Take 1 tablet (25 mg total) by mouth 2 (two) times daily. KEEP OV. 180 tablet 0   nitroGLYCERIN  (NITROSTAT ) 0.4 MG SL tablet Place 1 tablet (0.4 mg total) under the tongue every 5 (five) minutes x 3 doses as needed for chest pain (if no relief after 3rd dose, proceed to ED or call 911). 25 tablet 3   omeprazole  (PRILOSEC) 40 MG capsule Take 1 capsule (40 mg total) by mouth daily. For heartburn 90 capsule 3   OneTouch Delica Lancets 30G MISC Use to test blood sugar twice daily as directed; DX: E11.69 100 each 3   oxybutynin  (DITROPAN  XL) 5 MG 24 hr tablet  Take 1 tablet (5 mg total) by mouth at bedtime. For bladder 90 tablet 3   oxyCODONE  (ROXICODONE ) 5 MG immediate release tablet Take 1 tablet (5 mg total) by mouth every 6 (six) hours as needed for up to 10 doses for severe pain (pain score 7-10). 10 tablet 0   rosuvastatin  (CRESTOR ) 20 MG tablet Take 1 tablet (20 mg total) by mouth at bedtime. 90 tablet 3   Semaglutide  14 MG TABS Take 14 mg by mouth daily.     No current facility-administered medications for this visit.    Allergies: Allergies  Allergen Reactions   Farxiga  [Dapagliflozin ] Other (See Comments)    Recurrent yeast infections   Morphine  Nausea And Vomiting   Duloxetine  Other (See Comments)    Unknown    Trulicity  [Dulaglutide ] Other (See Comments)    GI adverse effects (okay on Rybelsus )    Past Medical History:  Diagnosis Date   Arthritis    CAD (coronary artery disease)    BMS to circumflex 2007 - Dr. Obie   CKD (chronic kidney disease) stage 3, GFR 30-59 ml/min (HCC)    COPD (chronic obstructive pulmonary disease) (HCC)    Depression    Dyspnea    Essential hypertension    Falls frequently    GERD (gastroesophageal reflux disease)    HA (headache)    Hyperlipidemia    Hyperlipidemia associated with type 2 diabetes mellitus (HCC) 02/03/2011   Left-sided face pain    Morbid obesity (HCC)    Noncompliance    OSA (obstructive sleep apnea)    Uses CPAP   Type 2 diabetes mellitus (HCC)    Urinary incontinence    Urinary incontinence  Past Surgical History:  Procedure Laterality Date   ABDOMINAL HERNIA REPAIR     ABDOMINAL HYSTERECTOMY     BACK SURGERY     BREAST SURGERY     biopsy per right breast; pt states has silver piece in for marking    CARPAL TUNNEL RELEASE Left 06/13/2023   Procedure: LEFT CARPAL TUNNEL RELEASE;  Surgeon: Onesimo Oneil LABOR, MD;  Location: [facility];  Service: Orthopedics;  Laterality: Left;   Cataract surgery Bilateral    CHOLECYSTECTOMY     COLONOSCOPY N/A 04/10/2013   Procedure:  COLONOSCOPY;  Surgeon: Claudis RAYMOND Rivet, MD;  Location: [facility];  Service: Endoscopy;  Laterality: N/A;   CORONARY ANGIOPLASTY WITH STENT PLACEMENT  2012   HERNIA REPAIR     LEFT HEART CATH AND CORONARY ANGIOGRAPHY N/A 06/19/2020   Procedure: LEFT HEART CATH AND CORONARY ANGIOGRAPHY;  Surgeon: Claudene Victory ORN, MD;  Location: [facility];  Service: Cardiovascular;  Laterality: N/A;   LUMBAR LAMINECTOMY/DECOMPRESSION MICRODISCECTOMY N/A 04/25/2015   Procedure: CENTRAL LUMBAR /DECOMPRESSION L4-L5;  Surgeon: Tanda Heading, MD;  Location: [facility];  Service: Orthopedics;  Laterality: N/A;   TUBAL LIGATION     Family History  Problem Relation Age of Onset   Breast cancer Mother    Cancer Father    Coronary artery disease Father    Cancer - Colon Father    Diabetes Father    High Cholesterol Father    Arthritis Sister    Asthma Sister    High Cholesterol Daughter    Thyroid  disease Daughter    Hiatal hernia Son    Congenital heart disease Son    Social History   Socioeconomic History   Marital status: Married    Spouse name: Elsie   Number of children: 5   Years of education: 10 th   Highest education level: 10th grade  Occupational History   Occupation: disabled     Comment: disabled  Tobacco Use   Smoking status: Not on file   Smokeless tobacco: Never   Tobacco comments:    Smoking Cessation Classes, Services, Engineering geologist.  Vaping Use   Vaping status: Never Used  Substance and Sexual Activity   Alcohol  use: Not on file   Drug use: Not on file   Sexual activity: Not on file  Other Topics Concern   Not on file  Social History Narrative   Patient lives at home with her husband. Patient is disabled.   Children live nearby   Patient has 10 th grade education.   Right handed.   Caffeine- one  cup of coffee and  One soda Dr.Pepper/ tea. daily   Social Drivers of Health   Financial Resource Strain: Low Risk  (05/04/2024)   Overall Financial  Resource Strain (CARDIA)    Difficulty of Paying Living Expenses: Not very hard  Food Insecurity: Food Insecurity Present (05/29/2024)   Hunger Vital Sign    Worried About Running Out of Food in the Last Year: Never true    Ran Out of Food in the Last Year: Sometimes true  Transportation Needs: No Transportation Needs (05/29/2024)   PRAPARE - Administrator, Civil Service (Medical): No    Lack of Transportation (Non-Medical): No  Physical Activity: Inactive (01/10/2024)   Exercise Vital Sign    Days of Exercise per Week: 0 days    Minutes of Exercise per Session: 0 min  Stress: No Stress Concern Present (01/10/2024)   [employer]  of Occupational Health - Occupational Stress Questionnaire    Feeling of Stress : Not at all  Social Connections: Moderately Integrated (02/15/2024)   Social Connection and Isolation Panel    Frequency of Communication with Friends and Family: More than three times a week    Frequency of Social Gatherings with Friends and Family: Three times a week    Attends Religious Services: More than 4 times per year    Active Member of Clubs or Organizations: No    Attends Banker Meetings: Never    Marital Status: Married  Catering manager Violence: Not At Risk (05/29/2024)   Humiliation, Afraid, Rape, and Kick questionnaire    Fear of Current or Ex-Partner: No    Emotionally Abused: No    Physically Abused: No    Sexually Abused: No    SUBJECTIVE  Review of Systems Constitutional: Patient denies any unintentional weight loss or change in strength lntegumentary: Patient denies any rashes or pruritus Cardiovascular: Patient denies chest pain or syncope Respiratory: Patient denies shortness of breath Gastrointestinal: ***As per HPI Musculoskeletal: Patient denies muscle cramps or weakness Neurologic: Patient denies convulsions or seizures Allergic/Immunologic: Patient denies recent allergic reaction(s) Hematologic/Lymphatic: Patient  denies bleeding tendencies Endocrine: Patient denies heat/cold intolerance  GU: As per HPI.  OBJECTIVE There were no vitals filed for this visit. There is no height or weight on file to calculate BMI.  Physical Examination Constitutional: No obvious distress; patient is non-toxic appearing  Cardiovascular: No visible lower extremity edema.  Respiratory: The patient does not have audible wheezing/stridor; respirations do not appear labored  Gastrointestinal: Abdomen non-distended Musculoskeletal: Normal ROM of UEs  Skin: No obvious rashes/open sores  Neurologic: CN 2-12 grossly intact Psychiatric: Answered questions appropriately with normal affect  Hematologic/Lymphatic/Immunologic: No obvious bruises or sites of spontaneous bleeding  UA: ***negative ***positive for *** leukocytes, *** blood, ***nitrites Urine microscopy: *** WBC/hpf, *** RBC/hpf, *** bacteria ***glucosuria (secondary to ***Jardiance  ***Farxiga  use) ***otherwise unremarkable  PVR: *** ml  ASSESSMENT No diagnosis found.  ***Recurrent UTls / ***History of UTls with insufficient urine culture data to formally validate recurrent UTI diagnosis:  We discussed the possible etiologies of recurrent UTls including ascending infection related to intercourse; ***vaginal atrophy; transmural infection that has been treated incompletely; urinary tract stones; incomplete bladder emptying with urinary stasis; kidney or bladder tumor; urethral diverticulum; and colonization of  ***vagina and urinary tract with pathologic, adherent organisms.   Patient has known UTI risk factors including:  - ***use of immunosuppressant medication - ***T2DM - ***glucosuria secondary to ***Jardiance  ***Farxiga  use - ***incomplete bladder emptying - ***vaginal atrophy - ***fecal incontinence / soiling  ***We reviewed the diagnostic criteria for recurrent UTI and that we do not currently have adequate urine culture data to support or refute the  formal diagnosis of recurrent UTI. Discussed other possible etiologies for urinary symptoms including but not limited to: non-infectious cystitis (such as interstitial cystitis), stone, mass, malignancy, vulvovaginal irritation, etc.  ***Will request any additional outside urine cultures for review.  *** We discussed the difference between urinalysis (urine dipstick test) vs. urine culture and why urine cultures are needed to determine appropriate diagnosis and treatment of urologic symptoms. *** We reviewed UTI symptoms which may include: dysuria, acutely increased urinary urgency and/or frequency, fever, acute mental status change / confusion, general malaise, fatigue, weakness. *** We discussed that urine color, clarity, and odor are not considered to be clinical indicators of UTI and do not warrant urine testing unless patient is acutely symptomatic for  UTI.  For UTI prevention advised: > Maintain adequate fluid intake daily to flush out the urinary tract. > Go to the bathroom to urinate every 4-6 hours while awake to minimize urinary stasis / bacterial overgrowth in the bladder. ***> Starting topical vaginal estrogen cream. The rationale, appropriate use, and potential pros / cons were discussed in detail.  ***> UTI prophylaxis with antibiotic after intercourse. ***> UTI prophylaxis with a daily low dose antibiotic.   Handout provided with additional recommendations / options for UTI prevention.   For management of acute UTI symptoms: - ***Discussed option to take over-the-counter Pyridium  (commonly known under the AZO brand name) for bladder pain. No more than 3 days at a time.  ***We discussed the role of diagnostic testing for further evaluation and investigation of potential infectious nidus such as: cystoscopy - RUS CT stone study Microgen PCR testing  Patient ultimately decided to start with *** and ***will consider OTC supplements for UTI prevention. Handout provided.  We  agreed to plan for follow up in *** months or sooner if needed.  Patient verbalized understanding of and agreement with current plan. All questions were answered.  PLAN Advised the following: ***. Start topical vaginal estrogen cream as prescribed. ***. Maintain adequate fluid intake daily to flush out the urinary tract. ***. Urinate every 4-6 hours while awake to minimize urinary stasis / bacterial overgrowth in the bladder. ***. Consider OTC supplements for UTI prevention. ***. ***No follow-ups on file.  No orders of the defined types were placed in this encounter.   It has been explained that the patient is to follow regularly with their PCP in addition to all other providers involved in their care and to follow instructions provided by these respective offices. Patient advised to contact urology clinic if any urologic-pertaining questions, concerns, new symptoms or problems arise in the interim period.  There are no Patient Instructions on file for this visit.  Electronically signed by:  Lauraine KYM Oz, MSN, FNP-C, CUNP 05/30/2024 1:27 PM

## 2024-05-31 ENCOUNTER — Ambulatory Visit: Admitting: Urology

## 2024-05-31 DIAGNOSIS — N390 Urinary tract infection, site not specified: Secondary | ICD-10-CM

## 2024-05-31 DIAGNOSIS — Z87448 Personal history of other diseases of urinary system: Secondary | ICD-10-CM

## 2024-06-01 ENCOUNTER — Other Ambulatory Visit: Payer: Self-pay | Admitting: Family Medicine

## 2024-06-01 DIAGNOSIS — E1142 Type 2 diabetes mellitus with diabetic polyneuropathy: Secondary | ICD-10-CM

## 2024-06-01 MED ORDER — LANCET DEVICE MISC: 0 refills | Status: AC

## 2024-06-01 MED ORDER — BLOOD GLUCOSE TEST VI STRP: ORAL_STRIP | 4 refills | Status: AC

## 2024-06-01 MED ORDER — LANCETS MISC. MISC: 4 refills | Status: AC

## 2024-06-01 MED ORDER — BLOOD GLUCOSE MONITORING SUPPL DEVI: 0 refills | Status: DC

## 2024-06-06 DIAGNOSIS — N1832 Chronic kidney disease, stage 3b: Secondary | ICD-10-CM | POA: Diagnosis not present

## 2024-06-06 DIAGNOSIS — Z6841 Body Mass Index (BMI) 40.0 and over, adult: Secondary | ICD-10-CM | POA: Diagnosis not present

## 2024-06-06 DIAGNOSIS — E1142 Type 2 diabetes mellitus with diabetic polyneuropathy: Secondary | ICD-10-CM | POA: Diagnosis not present

## 2024-06-06 DIAGNOSIS — I159 Secondary hypertension, unspecified: Secondary | ICD-10-CM | POA: Diagnosis not present

## 2024-06-06 DIAGNOSIS — D696 Thrombocytopenia, unspecified: Secondary | ICD-10-CM | POA: Diagnosis not present

## 2024-06-06 DIAGNOSIS — J449 Chronic obstructive pulmonary disease, unspecified: Secondary | ICD-10-CM | POA: Diagnosis not present

## 2024-06-06 DIAGNOSIS — E1122 Type 2 diabetes mellitus with diabetic chronic kidney disease: Secondary | ICD-10-CM | POA: Diagnosis not present

## 2024-06-06 DIAGNOSIS — E1159 Type 2 diabetes mellitus with other circulatory complications: Secondary | ICD-10-CM | POA: Diagnosis not present

## 2024-06-06 DIAGNOSIS — E1169 Type 2 diabetes mellitus with other specified complication: Secondary | ICD-10-CM | POA: Diagnosis not present

## 2024-06-06 DIAGNOSIS — E785 Hyperlipidemia, unspecified: Secondary | ICD-10-CM | POA: Diagnosis not present

## 2024-06-06 DIAGNOSIS — Z794 Long term (current) use of insulin: Secondary | ICD-10-CM | POA: Diagnosis not present

## 2024-06-06 DIAGNOSIS — I890 Lymphedema, not elsewhere classified: Secondary | ICD-10-CM | POA: Diagnosis not present

## 2024-06-08 ENCOUNTER — Other Ambulatory Visit

## 2024-06-08 DIAGNOSIS — Z794 Long term (current) use of insulin: Secondary | ICD-10-CM

## 2024-06-08 DIAGNOSIS — Z7984 Long term (current) use of oral hypoglycemic drugs: Secondary | ICD-10-CM

## 2024-06-08 DIAGNOSIS — E119 Type 2 diabetes mellitus without complications: Secondary | ICD-10-CM

## 2024-06-08 NOTE — Progress Notes (Signed)
 06/08/2024 Name: [name] MRN: [id number] DOB: 10-26-1961  Chief Complaint  Patient presents with   Diabetes    [name] is a 63 y.o. year old female who presented for a telephone visit.  I connected with  [name] by telephone and verified that I am speaking with the correct person using two identifiers. I discussed the limitations of evaluation and management by telemedicine. The patient expressed understanding and agreed to proceed.  Patient was located in her home and PharmD in PCP office during this visit.   They were referred to the pharmacist by their PCP for assistance in managing diabetes, medication access, and complex medication management.   Subjective:  Care Team: Primary Care Provider: Jolinda Norene HERO, DO ; Next Scheduled Visit: 06/18/24   Medication Access/Adherence  Current Pharmacy:  [facility] [address] [place] - [address] [place] [address] Phone: [phone] Fax: [phone] [name] [place], [place] - [address] [place] [address] Phone: [phone] Fax: [phone]  Patient reports affordability concerns with their medications: Yes --enrolled in [employer] PAP (rybelsus ); [facility] (basaglar )--may have LIS/extra help  Patient reports access/transportation concerns to their pharmacy: No  Patient reports adherence concerns with their medications:  No    Diabetes:  Current medications:  Rybelsus  14mg  daily, Basaglar  45-50u daily Medications tried in the past: Farxiga , Trulicity   Current glucose readings: 80-90 FBG,  Was previously libre 3 plus (covered $0 on previous plan, now not covered at DME, sent to pharmacy to see copay) Using traditional glucometer  Current meal patterns:  Discussed meal planning options and Plate method for healthy eating Avoid sugary drinks and desserts Incorporate balanced protein, non  starchy veggies, 1 serving of carbohydrate with each meal Increase water intake Increase physical activity as able  Current medication access support: [facility] basaglar   Objective:  Lab Results  Component Value Date   HGBA1C 6.5 (H) 02/15/2024    Lab Results  Component Value Date   CREATININE 1.32 (H) 02/28/2024   BUN 11 02/28/2024   NA 141 02/28/2024   K 3.6 02/28/2024   CL 103 02/28/2024   CO2 25 02/28/2024    Lab Results  Component Value Date   CHOL 151 08/09/2022   HDL 38 (L) 08/09/2022   LDLCALC 76 08/09/2022   LDLDIRECT 52 01/12/2022   TRIG 224 (H) 08/09/2022   CHOLHDL 4.0 08/09/2022    Medications Reviewed Today     Reviewed by [name], [name] (Pharmacist) on 06/08/24 at 1418  Med List Status: <None>   Medication Order Taking? Sig Documenting Provider Last Dose Status Informant  acetaminophen  (TYLENOL ) 325 MG tablet [id number]  Take 975 mg by mouth every 8 (eight) hours as needed for mild pain (pain score 1-3). [provider]  Active Self  albuterol  (VENTOLIN  HFA) 108 (90 Base) MCG/ACT inhaler [id number]  INHALE 2 PUFFS BY MOUTH EVERY 4 HOURS AS NEEDED FOR WHEEZING OR SHORTNESS OF BREATH (FOR RESCUE) Stacey Norene HERO, DO  Active Self  aspirin  EC 325 MG tablet [id number]  Take 325 mg by mouth daily. [provider]  Active Self  Blood Glucose Monitoring Suppl DEVI [id number]  Check BGs 4 times daily. E11.65 May substitute to any manufacturer covered by patient's insurance. Stacey Norene M, DO  Active   docusate sodium  (COLACE) 100 MG capsule [id number]  Take 1 capsule (100 mg total)  by mouth 2 (two) times daily as needed for mild constipation. Stacey Potter M, DO  Active   furosemide  (LASIX ) 40 MG tablet [id number]  TAKE 1 TABLET BY MOUTH ONCE DAILY AS NEEDED FOR EDEMA OR  FLUID Stacey Potter M, DO  Active   gabapentin  (NEURONTIN ) 600 MG tablet [id number]  TAKE 1 & 1/2 (ONE & ONE-HALF) TABLETS BY MOUTH THREE TIMES DAILY Stacey Potter M, DO  Active   Glucose Blood (BLOOD GLUCOSE TEST STRIPS) STRP [id number]  Check BGs 4times daily. E11.65 May substitute to any manufacturer covered by patient's insurance. Stacey Potter M, DO  Active   Insulin  Glargine (BASAGLAR  KWIKPEN) 100 UNIT/ML [id number]  Inject 45-50 Units into the skin at bedtime. May increase up to 50 units if instructed by your provider. Stacey Potter HERO, DO  Active Self           Med Note ([name], [name]   Wed Feb 15, 2024  5:26 PM) 45 last pm  Insulin  Pen Needle 29G X MISC [id number]  Use to inject insulin  daily as directed. DX:E11.65; please profile for future needs Stacey Potter [name] ROSALEA  Active Self  Lancet Device MISC [id number]  Check BGs 4x daily. E11.65 May substitute to any manufacturer covered by patient's insurance. Stacey Potter M, DO  Active   Lancets Misc. MISC [id number]  Check BGs 4x daily. E11.65 May substitute to any manufacturer covered by patient's insurance. Stacey Potter M, DO  Active   metoprolol  tartrate (LOPRESSOR ) 25 MG tablet [id number]  Take 1 tablet (25 mg total) by mouth 2 (two) times daily. KEEP OV. Stacey Norris, NP  Active   nitroGLYCERIN  (NITROSTAT ) 0.4 MG SL tablet [id number]  Place 1 tablet (0.4 mg total) under the tongue every 5 (five) minutes x 3 doses as needed for chest pain (if no relief after 3rd dose, proceed to ED or call 911). Stacey Norris, NP  Active Self  omeprazole  (PRILOSEC) 40 MG capsule [id number]  Take 1 capsule (40 mg total) by mouth daily. For heartburn Stacey Potter M, DO  Active   oxybutynin  (DITROPAN  XL) 5 MG 24 hr tablet [id number]  Take 1 tablet (5 mg total) by mouth at bedtime. For bladder Stacey Potter M, DO  Active   oxyCODONE  (ROXICODONE ) 5 MG immediate release tablet [id number]  Take 1 tablet (5 mg total) by mouth every 6 (six) hours as needed for up to 10 doses for severe pain (pain score 7-10). Sira, Zackery, MD  Active   rosuvastatin  (CRESTOR ) 20 MG tablet [id number]  Take 1  tablet (20 mg total) by mouth at bedtime. Stacey Potter M, DO  Active   Semaglutide  14 MG TABS [id number]  Take 14 mg by mouth daily. [provider]  Active Self           Med Note TENA, MLISS [name]   Tue Sep 22, 2021  3:08 PM) VIA [facility] PATIENT ASSISTANCE PROGRAM              Assessment/Plan:   Diabetes: - Currently controlled - Reviewed long term cardiovascular and renal outcomes of uncontrolled blood sugar - Reviewed goal A1c, goal fasting, and goal 2 hour post prandial glucose - Recommend to :  Continue current regimen  Restart LIBRE 3 PLUS CGM(covered $0 on previous plan, now not covered at DME, sent to pharmacy to see copay)  Samples given today Using traditional glucometer - Patient denies personal or family history of multiple endocrine neoplasia type 2, medullary thyroid  cancer; personal  history of pancreatitis or gallbladder disease. - Meets financial criteria for basaglar  patient assistance program through [facility]. Will collaborate with provider, CPhT, and patient to pursue assistance.  -compliant with statin -ACEi stopped per pulm due to cough/choking sensation in 2020; ARB stopped due to GFR on 2025    Follow Up Plan: PCP 07/02/24  Mliss Tarry Griffin, PharmD, BCACP, CPP Clinical Pharmacist, [facility]

## 2024-06-11 ENCOUNTER — Other Ambulatory Visit

## 2024-06-11 DIAGNOSIS — R809 Proteinuria, unspecified: Secondary | ICD-10-CM | POA: Diagnosis not present

## 2024-06-11 DIAGNOSIS — N189 Chronic kidney disease, unspecified: Secondary | ICD-10-CM | POA: Diagnosis not present

## 2024-06-11 DIAGNOSIS — D631 Anemia in chronic kidney disease: Secondary | ICD-10-CM | POA: Diagnosis not present

## 2024-06-11 DIAGNOSIS — R778 Other specified abnormalities of plasma proteins: Secondary | ICD-10-CM | POA: Diagnosis not present

## 2024-06-13 DIAGNOSIS — I890 Lymphedema, not elsewhere classified: Secondary | ICD-10-CM | POA: Diagnosis not present

## 2024-06-13 DIAGNOSIS — D696 Thrombocytopenia, unspecified: Secondary | ICD-10-CM | POA: Diagnosis not present

## 2024-06-13 DIAGNOSIS — N1832 Chronic kidney disease, stage 3b: Secondary | ICD-10-CM | POA: Diagnosis not present

## 2024-06-13 DIAGNOSIS — E1122 Type 2 diabetes mellitus with diabetic chronic kidney disease: Secondary | ICD-10-CM | POA: Diagnosis not present

## 2024-06-13 DIAGNOSIS — E1142 Type 2 diabetes mellitus with diabetic polyneuropathy: Secondary | ICD-10-CM | POA: Diagnosis not present

## 2024-06-13 DIAGNOSIS — Z6841 Body Mass Index (BMI) 40.0 and over, adult: Secondary | ICD-10-CM | POA: Diagnosis not present

## 2024-06-13 DIAGNOSIS — I159 Secondary hypertension, unspecified: Secondary | ICD-10-CM | POA: Diagnosis not present

## 2024-06-13 DIAGNOSIS — J449 Chronic obstructive pulmonary disease, unspecified: Secondary | ICD-10-CM | POA: Diagnosis not present

## 2024-06-13 DIAGNOSIS — E785 Hyperlipidemia, unspecified: Secondary | ICD-10-CM | POA: Diagnosis not present

## 2024-06-13 DIAGNOSIS — Z794 Long term (current) use of insulin: Secondary | ICD-10-CM | POA: Diagnosis not present

## 2024-06-13 DIAGNOSIS — E1159 Type 2 diabetes mellitus with other circulatory complications: Secondary | ICD-10-CM | POA: Diagnosis not present

## 2024-06-13 DIAGNOSIS — E1169 Type 2 diabetes mellitus with other specified complication: Secondary | ICD-10-CM | POA: Diagnosis not present

## 2024-06-15 NOTE — Progress Notes (Deleted)
 Subjective: CC:DM PCP: Jolinda Norene HERO, DO YEP:[name] is a 63 y.o. female presenting to clinic today for:  1. Type 2 Diabetes with hypertension, hyperlipidemia W/ CKD3a:  Sees Dr Rachele for renal disease.    High at home: ***; Low at home: ***, Taking medication(s): ***,.  Diabetes Health Maintenance Due  Topic Date Due   OPHTHALMOLOGY EXAM  12/26/2019   FOOT EXAM  03/20/2024   HEMOGLOBIN A1C  08/16/2024    Last A1c:  Lab Results  Component Value Date   HGBA1C 6.5 (H) 02/15/2024    ROS: ***dizziness, LOC, polyuria, polydipsia, unintended weight loss/gain, foot ulcerations, numbness or tingling in extremities, shortness of breath or chest pain.   ROS: Per HPI  Allergies  Allergen Reactions   Farxiga  [Dapagliflozin ] Other (See Comments)    Recurrent yeast infections   Morphine  Nausea And Vomiting   Duloxetine  Other (See Comments)    Unknown    Metformin  And Related    Trulicity  [Dulaglutide ] Other (See Comments)    GI adverse effects (okay on Rybelsus )   Past Medical History:  Diagnosis Date   Arthritis    CAD (coronary artery disease)    BMS to circumflex 2007 - Dr. Obie   CKD (chronic kidney disease) stage 3, GFR 30-59 ml/min (HCC)    COPD (chronic obstructive pulmonary disease) (HCC)    Depression    Dyspnea    Essential hypertension    Falls frequently    GERD (gastroesophageal reflux disease)    HA (headache)    Hyperlipidemia    Hyperlipidemia associated with type 2 diabetes mellitus (HCC) 02/03/2011   Left-sided face pain    Morbid obesity (HCC)    Noncompliance    OSA (obstructive sleep apnea)    Uses CPAP   Type 2 diabetes mellitus (HCC)    Urinary incontinence    Urinary incontinence     Current Outpatient Medications:    acetaminophen  (TYLENOL ) 325 MG tablet, Take 975 mg by mouth every 8 (eight) hours as needed for mild pain (pain score 1-3)., Disp: , Rfl:    albuterol  (VENTOLIN  HFA) 108 (90 Base) MCG/ACT inhaler, INHALE 2  PUFFS BY MOUTH EVERY 4 HOURS AS NEEDED FOR WHEEZING OR SHORTNESS OF BREATH (FOR RESCUE), Disp: 9 g, Rfl: 1   aspirin  EC 325 MG tablet, Take 325 mg by mouth daily., Disp: , Rfl:    Blood Glucose Monitoring Suppl DEVI, Check BGs 4 times daily. E11.65 May substitute to any manufacturer covered by patient's insurance., Disp: 1 each, Rfl: 0   docusate sodium  (COLACE) 100 MG capsule, Take 1 capsule (100 mg total) by mouth 2 (two) times daily as needed for mild constipation., Disp: 100 capsule, Rfl: 0   furosemide  (LASIX ) 40 MG tablet, TAKE 1 TABLET BY MOUTH ONCE DAILY AS NEEDED FOR EDEMA OR  FLUID, Disp: 90 tablet, Rfl: 0   gabapentin  (NEURONTIN ) 600 MG tablet, TAKE 1 & 1/2 (ONE & ONE-HALF) TABLETS BY MOUTH THREE TIMES DAILY, Disp: 270 tablet, Rfl: 12   Glucose Blood (BLOOD GLUCOSE TEST STRIPS) STRP, Check BGs 4times daily. E11.65 May substitute to any manufacturer covered by patient's insurance., Disp: 400 strip, Rfl: 4   Insulin  Glargine (BASAGLAR  KWIKPEN) 100 UNIT/ML, Inject 45-50 Units into the skin at bedtime. May increase up to 50 units if instructed by your provider., Disp: 45 mL, Rfl: 5   Insulin  Pen Needle 29G X MISC, Use to inject insulin  daily as directed. DX:E11.65; please profile for future needs, Disp:  300 each, Rfl: 5   Lancet Device MISC, Check BGs 4x daily. E11.65 May substitute to any manufacturer covered by patient's insurance., Disp: 1 each, Rfl: 0   Lancets Misc. MISC, Check BGs 4x daily. E11.65 May substitute to any manufacturer covered by patient's insurance., Disp: 400 each, Rfl: 4   metoprolol  tartrate (LOPRESSOR ) 25 MG tablet, Take 1 tablet (25 mg total) by mouth 2 (two) times daily. KEEP OV., Disp: 180 tablet, Rfl: 0   nitroGLYCERIN  (NITROSTAT ) 0.4 MG SL tablet, Place 1 tablet (0.4 mg total) under the tongue every 5 (five) minutes x 3 doses as needed for chest pain (if no relief after 3rd dose, proceed to ED or call 911)., Disp: 25 tablet, Rfl: 3   omeprazole  (PRILOSEC) 40 MG  capsule, Take 1 capsule (40 mg total) by mouth daily. For heartburn, Disp: 90 capsule, Rfl: 3   oxybutynin  (DITROPAN  XL) 5 MG 24 hr tablet, Take 1 tablet (5 mg total) by mouth at bedtime. For bladder, Disp: 90 tablet, Rfl: 3   oxyCODONE  (ROXICODONE ) 5 MG immediate release tablet, Take 1 tablet (5 mg total) by mouth every 6 (six) hours as needed for up to 10 doses for severe pain (pain score 7-10)., Disp: 10 tablet, Rfl: 0   rosuvastatin  (CRESTOR ) 20 MG tablet, Take 1 tablet (20 mg total) by mouth at bedtime., Disp: 90 tablet, Rfl: 3   Semaglutide  14 MG TABS, Take 14 mg by mouth daily., Disp: , Rfl:  Social History   Socioeconomic History   Marital status: Married    Spouse name: Elsie   Number of children: 5   Years of education: 10 th   Highest education level: 10th grade  Occupational History   Occupation: disabled     Comment: disabled  Tobacco Use   Smoking status: Not on file   Smokeless tobacco: Never   Tobacco comments:    Smoking Cessation Classes, Services, Engineering geologist.  Vaping Use   Vaping status: Never Used  Substance and Sexual Activity   Alcohol  use: Not on file   Drug use: Not on file   Sexual activity: Not on file  Other Topics Concern   Not on file  Social History Narrative   Patient lives at home with her husband. Patient is disabled.   Children live nearby   Patient has 10 th grade education.   Right handed.   Caffeine- one  cup of coffee and  One soda Dr.Pepper/ tea. daily   Social Drivers of Health   Financial Resource Strain: Low Risk  (05/04/2024)   Overall Financial Resource Strain (CARDIA)    Difficulty of Paying Living Expenses: Not very hard  Food Insecurity: Food Insecurity Present (05/29/2024)   Hunger Vital Sign    Worried About Running Out of Food in the Last Year: Never true    Ran Out of Food in the Last Year: Sometimes true  Transportation Needs: No Transportation Needs (05/29/2024)   PRAPARE - Scientist, research (physical sciences) (Medical): No    Lack of Transportation (Non-Medical): No  Physical Activity: Inactive (01/10/2024)   Exercise Vital Sign    Days of Exercise per Week: 0 days    Minutes of Exercise per Session: 0 min  Stress: No Stress Concern Present (01/10/2024)   [employer] of Occupational Health - Occupational Stress Questionnaire    Feeling of Stress : Not at all  Social Connections: Moderately Integrated (02/15/2024)   Social Connection and Isolation Panel  Frequency of Communication with Friends and Family: More than three times a week    Frequency of Social Gatherings with Friends and Family: Three times a week    Attends Religious Services: More than 4 times per year    Active Member of Clubs or Organizations: No    Attends Banker Meetings: Never    Marital Status: Married  Catering manager Violence: Not At Risk (05/29/2024)   Humiliation, Afraid, Rape, and Kick questionnaire    Fear of Current or Ex-Partner: No    Emotionally Abused: No    Physically Abused: No    Sexually Abused: No   Family History  Problem Relation Age of Onset   Breast cancer Mother    Cancer Father    Coronary artery disease Father    Cancer - Colon Father    Diabetes Father    High Cholesterol Father    Arthritis Sister    Asthma Sister    High Cholesterol Daughter    Thyroid  disease Daughter    Hiatal hernia Son    Congenital heart disease Son     Objective: Office vital signs reviewed. There were no vitals taken for this visit.  Physical Examination:  General: Awake, alert, *** nourished, No acute distress HEENT: Normal    Neck: No masses palpated. No lymphadenopathy    Ears: Tympanic membranes intact, normal light reflex, no erythema, no bulging    Eyes: PERRLA, extraocular membranes intact, sclera ***    Nose: nasal turbinates moist, *** nasal discharge    Throat: moist mucus membranes, no erythema, *** tonsillar exudate.  Airway is patent Cardio: regular rate and  rhythm, S1S2 heard, no murmurs appreciated Pulm: clear to auscultation bilaterally, no wheezes, rhonchi or rales; normal work of breathing on room air GI: soft, non-tender, non-distended, bowel sounds present x4, no hepatomegaly, no splenomegaly, no masses GU: external vaginal tissue ***, cervix ***, *** punctate lesions on cervix appreciated, *** discharge from cervical os, *** bleeding, *** cervical motion tenderness, *** abdominal/ adnexal masses Extremities: warm, well perfused, No edema, cyanosis or clubbing; +*** pulses bilaterally MSK: *** gait and *** station Skin: dry; intact; no rashes or lesions Neuro: *** Strength and light touch sensation grossly intact, *** DTRs ***/4  Diabetic Foot Exam - Simple   No data filed      Assessment/ Plan: 63 y.o. female   Type 2 diabetes mellitus with diabetic polyneuropathy, with long-term current use of insulin  (HCC)  Hyperlipidemia associated with type 2 diabetes mellitus (HCC)  Chronic kidney disease, stage 3b (HCC)  Hypertension associated with diabetes (HCC)   ***  Yerlin Gasparyan CHRISTELLA Fielding, DO [facility] [phone]

## 2024-06-18 ENCOUNTER — Ambulatory Visit: Payer: Self-pay

## 2024-06-18 ENCOUNTER — Ambulatory Visit: Admitting: Family Medicine

## 2024-06-18 DIAGNOSIS — E1142 Type 2 diabetes mellitus with diabetic polyneuropathy: Secondary | ICD-10-CM

## 2024-06-18 DIAGNOSIS — N1832 Chronic kidney disease, stage 3b: Secondary | ICD-10-CM

## 2024-06-18 DIAGNOSIS — E1159 Type 2 diabetes mellitus with other circulatory complications: Secondary | ICD-10-CM

## 2024-06-18 DIAGNOSIS — E1169 Type 2 diabetes mellitus with other specified complication: Secondary | ICD-10-CM

## 2024-06-18 NOTE — Patient Instructions (Incomplete)
 Schedule DEXA scan at checkout.

## 2024-06-18 NOTE — Telephone Encounter (Signed)
 Noted. Pt's appt was rescheduled by e2c2

## 2024-06-18 NOTE — Telephone Encounter (Signed)
 FYI Only or Action Required?: FYI only for provider.  Patient was last seen in primary care on 03/28/2024 by Jolinda Norene HERO, DO.  Called Nurse Triage reporting Nausea.  Symptoms began 06/18/2024 .  Interventions attempted: Nothing.  Symptoms are: unchanged.  Triage Disposition: No disposition on file.  Patient/caregiver understands and will follow disposition?:       Copied from CRM #[id number]. Topic: Clinical - Red Word Triage >> Jun 18, 2024  8:01 AM Willma R wrote: [facility] that prompted transfer to Nurse Triage: Patient has had diarrhea and vomiting since 6 am this morning. Has an appointment at 10 but wants to reschedule. Reason for Disposition  Unexplained nausea    Pt stated she only called in today due to the wanting to change today's appt due to the pt is not feeling well today: pt just wants to lay down.  Answer Assessment - Initial Assessment Questions 1. NAUSEA SEVERITY: How bad is the nausea? (e.g., mild, moderate, severe; dehydration, weight loss)     Nausea and vomiting 2. ONSET: When did the nausea begin?     06/18/2024 3. VOMITING: Any vomiting? If Yes, ask: How many times today?     ni 4. RECURRENT SYMPTOM: Have you had nausea before? If Yes, ask: When was the last time? What happened that time?     bo 5. CAUSE: What do you think is causing the nausea?     Unknown 6. PREGNANCY: Is there any chance you are pregnant? (e.g., unprotected intercourse, missed birth control pill, broken condom)     Na N/v, diarrhea, body aches.  Protocols used: Nausea-A-AH

## 2024-06-20 ENCOUNTER — Telehealth: Payer: Self-pay

## 2024-06-20 DIAGNOSIS — I159 Secondary hypertension, unspecified: Secondary | ICD-10-CM | POA: Diagnosis not present

## 2024-06-20 DIAGNOSIS — E1169 Type 2 diabetes mellitus with other specified complication: Secondary | ICD-10-CM | POA: Diagnosis not present

## 2024-06-20 DIAGNOSIS — N1832 Chronic kidney disease, stage 3b: Secondary | ICD-10-CM | POA: Diagnosis not present

## 2024-06-20 DIAGNOSIS — Z794 Long term (current) use of insulin: Secondary | ICD-10-CM | POA: Diagnosis not present

## 2024-06-20 DIAGNOSIS — Z6841 Body Mass Index (BMI) 40.0 and over, adult: Secondary | ICD-10-CM | POA: Diagnosis not present

## 2024-06-20 DIAGNOSIS — E1122 Type 2 diabetes mellitus with diabetic chronic kidney disease: Secondary | ICD-10-CM | POA: Diagnosis not present

## 2024-06-20 DIAGNOSIS — E785 Hyperlipidemia, unspecified: Secondary | ICD-10-CM | POA: Diagnosis not present

## 2024-06-20 DIAGNOSIS — I890 Lymphedema, not elsewhere classified: Secondary | ICD-10-CM | POA: Diagnosis not present

## 2024-06-20 DIAGNOSIS — E1159 Type 2 diabetes mellitus with other circulatory complications: Secondary | ICD-10-CM | POA: Diagnosis not present

## 2024-06-20 DIAGNOSIS — D696 Thrombocytopenia, unspecified: Secondary | ICD-10-CM | POA: Diagnosis not present

## 2024-06-20 DIAGNOSIS — J449 Chronic obstructive pulmonary disease, unspecified: Secondary | ICD-10-CM | POA: Diagnosis not present

## 2024-06-20 DIAGNOSIS — E1142 Type 2 diabetes mellitus with diabetic polyneuropathy: Secondary | ICD-10-CM | POA: Diagnosis not present

## 2024-06-20 LAB — LAB REPORT - SCANNED
A1c: 6.5
Creatinine, POC: 37.8 mg/dL
EGFR: 38
HM HIV Screening: NEGATIVE
HM Hepatitis Screen: NEGATIVE

## 2024-06-22 ENCOUNTER — Telehealth: Payer: Self-pay | Admitting: *Deleted

## 2024-06-22 ENCOUNTER — Other Ambulatory Visit: Payer: Self-pay | Admitting: *Deleted

## 2024-06-22 DIAGNOSIS — Z794 Long term (current) use of insulin: Secondary | ICD-10-CM

## 2024-06-22 MED ORDER — BLOOD GLUCOSE MONITORING SUPPL DEVI: 0 refills | Status: AC

## 2024-06-22 NOTE — Telephone Encounter (Signed)
 Patient will call and schedule a leg wrap change if her comfort levels diminish

## 2024-06-22 NOTE — Telephone Encounter (Signed)
 Contacted patient about glucose device.   She stated in call that the home health nurse says today was her last visit to wrap her legs.  She is scheduled to see Dr. Jolinda on 07/02/24.  Does she need to be seen before that to have her legs looked at or can it wait till appointment.

## 2024-06-22 NOTE — Telephone Encounter (Signed)
 It's more dependent on her discomfort right now. She has lymphedema.

## 2024-06-26 ENCOUNTER — Ambulatory Visit: Payer: Self-pay | Admitting: Family Medicine

## 2024-06-26 ENCOUNTER — Encounter: Payer: Self-pay | Admitting: *Deleted

## 2024-06-26 ENCOUNTER — Other Ambulatory Visit (HOSPITAL_COMMUNITY): Payer: Self-pay | Admitting: Family Medicine

## 2024-06-26 ENCOUNTER — Telehealth: Payer: Self-pay | Admitting: Pharmacist

## 2024-06-26 ENCOUNTER — Telehealth: Payer: Self-pay | Admitting: *Deleted

## 2024-06-26 DIAGNOSIS — E1165 Type 2 diabetes mellitus with hyperglycemia: Secondary | ICD-10-CM

## 2024-06-26 DIAGNOSIS — Z1231 Encounter for screening mammogram for malignant neoplasm of breast: Secondary | ICD-10-CM

## 2024-06-26 DIAGNOSIS — E119 Type 2 diabetes mellitus without complications: Secondary | ICD-10-CM | POA: Diagnosis not present

## 2024-06-26 DIAGNOSIS — H52223 Regular astigmatism, bilateral: Secondary | ICD-10-CM | POA: Diagnosis not present

## 2024-06-26 DIAGNOSIS — Z961 Presence of intraocular lens: Secondary | ICD-10-CM | POA: Diagnosis not present

## 2024-06-26 DIAGNOSIS — H524 Presbyopia: Secondary | ICD-10-CM | POA: Diagnosis not present

## 2024-06-26 LAB — HM DIABETES EYE EXAM

## 2024-06-26 MED ORDER — FREESTYLE LIBRE 3 PLUS SENSOR MISC: 11 refills | Status: AC

## 2024-06-26 MED ORDER — FREESTYLE LIBRE 3 PLUS SENSOR MISC: 11 refills | Status: DC

## 2024-06-26 NOTE — Telephone Encounter (Signed)
 Can we re-enroll [facility] ? Okay to route me entire PAP Thank you!

## 2024-06-26 NOTE — Patient Instructions (Signed)
 [name] - I am sorry I was unable to reach you today for our scheduled appointment. I work with Jolinda Norene HERO, DO and am calling to support your healthcare needs. Please contact me at [phone] at your earliest convenience. I look forward to speaking with you soon.   Thank you,  Rosina Forte, BSN RN [facility], [facility] RN Care Manager Direct Dial: [phone]  Fax: [phone]

## 2024-06-28 DIAGNOSIS — H47333 Pseudopapilledema of optic disc, bilateral: Secondary | ICD-10-CM | POA: Diagnosis not present

## 2024-06-28 DIAGNOSIS — H34812 Central retinal vein occlusion, left eye, with macular edema: Secondary | ICD-10-CM | POA: Diagnosis not present

## 2024-06-28 DIAGNOSIS — E113293 Type 2 diabetes mellitus with mild nonproliferative diabetic retinopathy without macular edema, bilateral: Secondary | ICD-10-CM | POA: Diagnosis not present

## 2024-06-28 MED ORDER — BASAGLAR KWIKPEN 100 UNIT/ML ~~LOC~~ SOPN: 45.0000 [IU] | PEN_INJECTOR | Freq: Every day | SUBCUTANEOUS | 5 refills | Status: DC

## 2024-06-28 NOTE — Telephone Encounter (Signed)
 New RX sent to [facility]

## 2024-06-28 NOTE — Telephone Encounter (Signed)
 Called patient to review results. Spoke with patients husband and made him aware. He voiced understanding and said that patient was on her way now to an eye appt with an Ophthalmologist in [place].

## 2024-06-28 NOTE — Addendum Note (Signed)
 Addended by: Kimberley Dastrup D on: 06/28/2024 03:23 PM   Modules accepted: Orders

## 2024-07-02 ENCOUNTER — Encounter: Payer: Self-pay | Admitting: Family Medicine

## 2024-07-02 ENCOUNTER — Ambulatory Visit (INDEPENDENT_AMBULATORY_CARE_PROVIDER_SITE_OTHER): Admitting: Family Medicine

## 2024-07-02 ENCOUNTER — Telehealth: Payer: Self-pay | Admitting: Family Medicine

## 2024-07-02 VITALS — BP 119/72 | HR 78 | Temp 97.1°F | Ht 62.0 in | Wt 293.0 lb

## 2024-07-02 DIAGNOSIS — E1159 Type 2 diabetes mellitus with other circulatory complications: Secondary | ICD-10-CM | POA: Diagnosis not present

## 2024-07-02 DIAGNOSIS — J42 Unspecified chronic bronchitis: Secondary | ICD-10-CM

## 2024-07-02 DIAGNOSIS — E1142 Type 2 diabetes mellitus with diabetic polyneuropathy: Secondary | ICD-10-CM | POA: Diagnosis not present

## 2024-07-02 DIAGNOSIS — I890 Lymphedema, not elsewhere classified: Secondary | ICD-10-CM | POA: Diagnosis not present

## 2024-07-02 DIAGNOSIS — Z794 Long term (current) use of insulin: Secondary | ICD-10-CM | POA: Diagnosis not present

## 2024-07-02 DIAGNOSIS — M48062 Spinal stenosis, lumbar region with neurogenic claudication: Secondary | ICD-10-CM

## 2024-07-02 DIAGNOSIS — N1831 Chronic kidney disease, stage 3a: Secondary | ICD-10-CM | POA: Diagnosis not present

## 2024-07-02 DIAGNOSIS — E1169 Type 2 diabetes mellitus with other specified complication: Secondary | ICD-10-CM

## 2024-07-02 DIAGNOSIS — Z23 Encounter for immunization: Secondary | ICD-10-CM

## 2024-07-02 DIAGNOSIS — E785 Hyperlipidemia, unspecified: Secondary | ICD-10-CM

## 2024-07-02 DIAGNOSIS — I152 Hypertension secondary to endocrine disorders: Secondary | ICD-10-CM | POA: Diagnosis not present

## 2024-07-02 LAB — BAYER DCA HB A1C WAIVED: HB A1C (BAYER DCA - WAIVED): 6.3 % — ABNORMAL HIGH (ref 4.8–5.6)

## 2024-07-02 MED ORDER — METHOCARBAMOL 500 MG PO TABS: 500.0000 mg | ORAL_TABLET | Freq: Three times a day (TID) | ORAL | 2 refills | Status: AC | PRN

## 2024-07-02 MED ORDER — HYDROCODONE-ACETAMINOPHEN 5-325 MG PO TABS: 1.0000 | ORAL_TABLET | Freq: Three times a day (TID) | ORAL | 0 refills | Status: DC | PRN

## 2024-07-02 MED ORDER — ALBUTEROL SULFATE HFA 108 (90 BASE) MCG/ACT IN AERS: 2.0000 | INHALATION_SPRAY | Freq: Four times a day (QID) | RESPIRATORY_TRACT | 1 refills | Status: DC | PRN

## 2024-07-02 NOTE — Progress Notes (Signed)
 Subjective: CC:DM PCP: Jolinda Norene HERO, DO [name] is a 63 y.o. female presenting to clinic today for:  Discussed the use of AI scribe software for clinical note transcription with the patient, who gave verbal consent to proceed.  History of Present Illness   Type 2 Diabetes with hypertension, hyperlipidemia:  Glucometer:Libre 3+.   High at home: 140s; Low at home: 34, Taking medication(s): Basaglar : 40 to 50 units daily, semaglutide  14 mg orally daily, Crestor  20 mg daily, Lopressor  25 mg twice daily.  Last eye exam: UTD Last foot exam: needs Last A1c:  Lab Results  Component Value Date   HGBA1C 6.5 (H) 02/15/2024   Nephropathy screen indicated?: needs Last flu, zoster and/or pneumovax:  Immunization History  Administered Date(s) Administered   Influenza,inj,Quad PF,6+ Mos 08/10/2013, 08/28/2014, 09/25/2015, 11/23/2017, 11/27/2018   Pneumococcal Conjugate-13 11/27/2018   Td (Adult),5 Lf Tetanus Toxid, Preservative Free 12/01/1998   Zoster Recombinant(Shingrix ) 07/02/2024    ROS: Ongoing lymphedema.  Her daughter is present today and she admits that her mother is not utilizing the lymphedema pumps as she is supposed to.  She had home health coming out doing wraps on her legs and that seemed to help for a while but they discontinued it.  She attributes the pulm to being in a different room in her having to spend most of her time taking care of her dying sister-in-law as a reason why she has not been consistent with them.  She is consistent with her diuretic and diet.  She sits a lot.  Ambulation is poor due to edema  2.  Low back pain She reports exacerbation of low back pain.  Needs refill on her methocarbamol .  She identifies this as something has been flared up even more since she is been taking care of Zelda Redo, her sister-in-law who is currently under hospice care at her home.   Diabetes Health Maintenance Due  Topic Date Due   FOOT EXAM  03/20/2024    HEMOGLOBIN A1C  08/16/2024   OPHTHALMOLOGY EXAM  06/26/2025    ROS: Per HPI  Allergies  Allergen Reactions   Farxiga  [Dapagliflozin ] Other (See Comments)    Recurrent yeast infections   Morphine  Nausea And Vomiting   Duloxetine  Other (See Comments)    Unknown    Metformin  And Related    Trulicity  [Dulaglutide ] Other (See Comments)    GI adverse effects (okay on Rybelsus )   Past Medical History:  Diagnosis Date   Arthritis    CAD (coronary artery disease)    BMS to circumflex 2007 - Dr. Obie   CKD (chronic kidney disease) stage 3, GFR 30-59 ml/min (HCC)    COPD (chronic obstructive pulmonary disease) (HCC)    Depression    Dyspnea    Essential hypertension    Falls frequently    GERD (gastroesophageal reflux disease)    HA (headache)    Hyperlipidemia    Hyperlipidemia associated with type 2 diabetes mellitus (HCC) 02/03/2011   Left-sided face pain    Morbid obesity (HCC)    Noncompliance    OSA (obstructive sleep apnea)    Uses CPAP   Type 2 diabetes mellitus (HCC)    Urinary incontinence    Urinary incontinence     Current Outpatient Medications:    acetaminophen  (TYLENOL ) 325 MG tablet, Take 975 mg by mouth every 8 (eight) hours as needed for mild pain (pain score 1-3)., Disp: , Rfl:    albuterol  (VENTOLIN  HFA) 108 (90 Base) MCG/ACT  inhaler, INHALE 2 PUFFS BY MOUTH EVERY 4 HOURS AS NEEDED FOR WHEEZING OR SHORTNESS OF BREATH (FOR RESCUE), Disp: 9 g, Rfl: 1   aspirin  EC 325 MG tablet, Take 325 mg by mouth daily., Disp: , Rfl:    Blood Glucose Monitoring Suppl DEVI, Check BGs 4 times daily. E11.65 May substitute to any manufacturer covered by patient's insurance., Disp: 1 each, Rfl: 0   Continuous Glucose Sensor (FREESTYLE LIBRE 3 PLUS SENSOR) MISC, Apply to back of arm every 15 days to test glucose continuously. DX: E11.65, Disp: 2 each, Rfl: 11   docusate sodium  (COLACE) 100 MG capsule, Take 1 capsule (100 mg total) by mouth 2 (two) times daily as needed for mild  constipation., Disp: 100 capsule, Rfl: 0   furosemide  (LASIX ) 40 MG tablet, TAKE 1 TABLET BY MOUTH ONCE DAILY AS NEEDED FOR EDEMA OR  FLUID, Disp: 90 tablet, Rfl: 0   gabapentin  (NEURONTIN ) 600 MG tablet, TAKE 1 & 1/2 (ONE & ONE-HALF) TABLETS BY MOUTH THREE TIMES DAILY, Disp: 270 tablet, Rfl: 12   Glucose Blood (BLOOD GLUCOSE TEST STRIPS) STRP, Check BGs 4times daily. E11.65 May substitute to any manufacturer covered by patient's insurance., Disp: 400 strip, Rfl: 4   Insulin  Glargine (BASAGLAR  KWIKPEN) 100 UNIT/ML, Inject 45-50 Units into the skin at bedtime. May increase up to 50 units if instructed by your provider., Disp: 45 mL, Rfl: 5   Insulin  Pen Needle 29G X MISC, Use to inject insulin  daily as directed. DX:E11.65; please profile for future needs, Disp: 300 each, Rfl: 5   Lancet Device MISC, Check BGs 4x daily. E11.65 May substitute to any manufacturer covered by patient's insurance., Disp: 1 each, Rfl: 0   Lancets Misc. MISC, Check BGs 4x daily. E11.65 May substitute to any manufacturer covered by patient's insurance., Disp: 400 each, Rfl: 4   metoprolol  tartrate (LOPRESSOR ) 25 MG tablet, Take 1 tablet (25 mg total) by mouth 2 (two) times daily. KEEP OV., Disp: 180 tablet, Rfl: 0   nitroGLYCERIN  (NITROSTAT ) 0.4 MG SL tablet, Place 1 tablet (0.4 mg total) under the tongue every 5 (five) minutes x 3 doses as needed for chest pain (if no relief after 3rd dose, proceed to ED or call 911)., Disp: 25 tablet, Rfl: 3   omeprazole  (PRILOSEC) 40 MG capsule, Take 1 capsule (40 mg total) by mouth daily. For heartburn, Disp: 90 capsule, Rfl: 3   oxybutynin  (DITROPAN  XL) 5 MG 24 hr tablet, Take 1 tablet (5 mg total) by mouth at bedtime. For bladder, Disp: 90 tablet, Rfl: 3   oxyCODONE  (ROXICODONE ) 5 MG immediate release tablet, Take 1 tablet (5 mg total) by mouth every 6 (six) hours as needed for up to 10 doses for severe pain (pain score 7-10)., Disp: 10 tablet, Rfl: 0   rosuvastatin  (CRESTOR ) 20 MG  tablet, Take 1 tablet (20 mg total) by mouth at bedtime., Disp: 90 tablet, Rfl: 3   Semaglutide  14 MG TABS, Take 14 mg by mouth daily., Disp: , Rfl:  Social History   Socioeconomic History   Marital status: Married    Spouse name: Elsie   Number of children: 5   Years of education: 10 th   Highest education level: 10th grade  Occupational History   Occupation: disabled     Comment: disabled  Tobacco Use   Smoking status: Not on file   Smokeless tobacco: Never   Tobacco comments:    Smoking Cessation Classes, Services, Engineering geologist.  Vaping Use  Vaping status: Never Used  Substance and Sexual Activity   Alcohol  use: Not on file   Drug use: Not on file   Sexual activity: Not on file  Other Topics Concern   Not on file  Social History Narrative   Patient lives at home with her husband. Patient is disabled.   Children live nearby   Patient has 10 th grade education.   Right handed.   Caffeine- one  cup of coffee and  One soda Dr.Pepper/ tea. daily   Social Drivers of Health   Financial Resource Strain: Low Risk  (05/04/2024)   Overall Financial Resource Strain (CARDIA)    Difficulty of Paying Living Expenses: Not very hard  Food Insecurity: Food Insecurity Present (05/29/2024)   Hunger Vital Sign    Worried About Running Out of Food in the Last Year: Never true    Ran Out of Food in the Last Year: Sometimes true  Transportation Needs: No Transportation Needs (05/29/2024)   PRAPARE - Administrator, Civil Service (Medical): No    Lack of Transportation (Non-Medical): No  Physical Activity: Inactive (01/10/2024)   Exercise Vital Sign    Days of Exercise per Week: 0 days    Minutes of Exercise per Session: 0 min  Stress: No Stress Concern Present (01/10/2024)   [employer] of Occupational Health - Occupational Stress Questionnaire    Feeling of Stress : Not at all  Social Connections: Moderately Integrated (02/15/2024)   Social Connection and  Isolation Panel    Frequency of Communication with Friends and Family: More than three times a week    Frequency of Social Gatherings with Friends and Family: Three times a week    Attends Religious Services: More than 4 times per year    Active Member of Clubs or Organizations: No    Attends Banker Meetings: Never    Marital Status: Married  Catering manager Violence: Not At Risk (05/29/2024)   Humiliation, Afraid, Rape, and Kick questionnaire    Fear of Current or Ex-Partner: No    Emotionally Abused: No    Physically Abused: No    Sexually Abused: No   Family History  Problem Relation Age of Onset   Breast cancer Mother    Cancer Father    Coronary artery disease Father    Cancer - Colon Father    Diabetes Father    High Cholesterol Father    Arthritis Sister    Asthma Sister    High Cholesterol Daughter    Thyroid  disease Daughter    Hiatal hernia Son    Congenital heart disease Son     Objective: Office vital signs reviewed. BP 119/72   Pulse 78   Temp (!) 97.1 F (36.2 C)   Ht 5' 2 (1.575 m)   Wt 293 lb (132.9 kg)   SpO2 96%   BMI 53.59 kg/m   Physical Examination:  General: Awake, alert, super morbidly obese, No acute distress.  Smells of tobacco HEENT: sclera white, MMM Cardio: regular rate and rhythm, S1S2 heard, no murmurs appreciated Pulm: clear to auscultation bilaterally, no wheezes, rhonchi or rales; normal work of breathing on room air Extremities: warm, lymphedema present to bilateral lower extremities.  Stasis changes noted to the skin MSK: Antalgic gait and station.  Ambulates with assistive device   Lab Results  Component Value Date   HGBA1C 6.5 (H) 02/15/2024    Assessment/ Plan: 63 y.o. female   Type 2 diabetes mellitus with  diabetic polyneuropathy, with long-term current use of insulin  (HCC) - Plan: CMP14+EGFR, Bayer DCA Hb A1c Waived  Hypertension associated with diabetes (HCC) - Plan: CMP14+EGFR  Hyperlipidemia  associated with type 2 diabetes mellitus (HCC) - Plan: CMP14+EGFR, Lipid Panel  Lymphedema - Plan: CMP14+EGFR, Ambulatory referral to Home Health  Stage 3a chronic kidney disease (HCC) - Plan: CMP14+EGFR, VITAMIN D  25 Hydroxy (Vit-D Deficiency, Fractures)  Chronic bronchitis, unspecified chronic bronchitis type (HCC) - Plan: albuterol  (VENTOLIN  HFA) 108 (90 Base) MCG/ACT inhaler  Spinal stenosis, lumbar region, with neurogenic claudication - Plan: methocarbamol  (ROBAXIN ) 500 MG tablet, HYDROcodone -acetaminophen  (NORCO/VICODIN) 5-325 MG tablet, Ambulatory referral to Home Health  Immunization due - Plan: Zoster Recombinant (Shingrix  )  Assessment & Plan   Sugar under excellent control.  No changes needed.  Check renal function, liver enzymes.  Needs urine microalbumin and foot exam at next visit  Blood pressure well-controlled.  No changes  Nonfasting lipid panel collected.  Continue statin  Persistent lymphedema.  Reinforced need for compliance with compression pumps, elevation of lower extremities.  Unna boots bilaterally placed today.  Will see if we can get home health back out and see if maybe they can address her low back issues as well  Keep appoint with Dr. Rachele for CKD 3A next week.  Check vitamin D , renal function.  At some point needs a DEXA scan  Albuterol  renewed but no active bronchitic flare  Robaxin  renewed.  Norco given as a short supply.  Discussed sparing use and possible side effects.  National narcotic database reviewed with no red flags  Shingles vaccination administered but she declined pneumonia vaccine  Norene CHRISTELLA Fielding, DO [facility] [phone]

## 2024-07-02 NOTE — Telephone Encounter (Signed)
 Name from pharmacy: METHOCARBAM 500MG    TAB   Pharmacy comment: insurance prefers tizanidine  tabs or cyclobenzaprine . need a PA for methocarbomol.

## 2024-07-03 ENCOUNTER — Telehealth: Payer: Self-pay

## 2024-07-03 ENCOUNTER — Ambulatory Visit: Payer: Self-pay | Admitting: Family Medicine

## 2024-07-03 ENCOUNTER — Other Ambulatory Visit (HOSPITAL_COMMUNITY): Payer: Self-pay

## 2024-07-03 LAB — CMP14+EGFR
ALT: 12 IU/L (ref 0–32)
AST: 15 IU/L (ref 0–40)
Albumin: 4.1 g/dL (ref 3.9–4.9)
Alkaline Phosphatase: 95 IU/L (ref 44–121)
BUN/Creatinine Ratio: 8 — ABNORMAL LOW (ref 12–28)
BUN: 12 mg/dL (ref 8–27)
Bilirubin Total: 0.3 mg/dL (ref 0.0–1.2)
CO2: 22 mmol/L (ref 20–29)
Calcium: 9.2 mg/dL (ref 8.7–10.3)
Chloride: 99 mmol/L (ref 96–106)
Creatinine, Ser: 1.51 mg/dL — ABNORMAL HIGH (ref 0.57–1.00)
Globulin, Total: 2.2 g/dL (ref 1.5–4.5)
Glucose: 83 mg/dL (ref 70–99)
Potassium: 3.7 mmol/L (ref 3.5–5.2)
Sodium: 137 mmol/L (ref 134–144)
Total Protein: 6.3 g/dL (ref 6.0–8.5)
eGFR: 38 mL/min/1.73 — ABNORMAL LOW (ref >59)

## 2024-07-03 LAB — LIPID PANEL
Chol/HDL Ratio: 3 ratio (ref 0.0–4.4)
Cholesterol, Total: 112 mg/dL (ref 100–199)
HDL: 37 mg/dL — ABNORMAL LOW (ref >39)
LDL Chol Calc (NIH): 49 mg/dL (ref 0–99)
Triglycerides: 152 mg/dL — ABNORMAL HIGH (ref 0–149)
VLDL Cholesterol Cal: 26 mg/dL (ref 5–40)

## 2024-07-03 LAB — VITAMIN D 25 HYDROXY (VIT D DEFICIENCY, FRACTURES): Vit D, 25-Hydroxy: 10.7 ng/mL — ABNORMAL LOW (ref 30.0–100.0)

## 2024-07-03 NOTE — Telephone Encounter (Signed)
 Tizanidine  and cyclobenzaprine  were too sedating for the patient which is why we switched her to methocarbamol  as needed.  Please go ahead and send this over to the prior Auth

## 2024-07-03 NOTE — Telephone Encounter (Signed)
 Pharmacy Patient Advocate Encounter  Received notification from [facility] that Prior Authorization for Methocarbamol  500MG  tablets  has been APPROVED from 07/03/24 to 07/03/25. Ran test claim, Copay is $0.57. This test claim was processed through [facility]- copay amounts may vary at other pharmacies due to pharmacy/plan contracts, or as the patient moves through the different stages of their insurance plan.   PA #/Case ID/Reference #: [id number]

## 2024-07-03 NOTE — Telephone Encounter (Signed)
 PA request has been Submitted. New Encounter has been or will be created for follow up. For additional info see Pharmacy Prior Auth telephone encounter from 07/03/24.

## 2024-07-03 NOTE — Telephone Encounter (Signed)
 Pharmacy Patient Advocate Encounter   Received notification from Pt Calls Messages that prior authorization for Methocarbamol  500MG  tablets is required/requested.   Insurance verification completed.   The patient is insured through [facility] .   Per test claim: PA required; PA submitted to above mentioned insurance via Latent Key/confirmation #/EOC [id number] Status is pending

## 2024-07-03 NOTE — Telephone Encounter (Signed)
 Approved.

## 2024-07-05 ENCOUNTER — Encounter (HOSPITAL_COMMUNITY): Payer: Self-pay

## 2024-07-05 ENCOUNTER — Ambulatory Visit (HOSPITAL_COMMUNITY)
Admission: RE | Admit: 2024-07-05 | Discharge: 2024-07-05 | Disposition: A | Discharge status: Home / Self Care | Source: Ambulatory Visit | Attending: Family Medicine | Admitting: Family Medicine

## 2024-07-05 DIAGNOSIS — Z1231 Encounter for screening mammogram for malignant neoplasm of breast: Secondary | ICD-10-CM | POA: Insufficient documentation

## 2024-07-09 DIAGNOSIS — I129 Hypertensive chronic kidney disease with stage 1 through stage 4 chronic kidney disease, or unspecified chronic kidney disease: Secondary | ICD-10-CM | POA: Diagnosis not present

## 2024-07-09 DIAGNOSIS — E1122 Type 2 diabetes mellitus with diabetic chronic kidney disease: Secondary | ICD-10-CM | POA: Diagnosis not present

## 2024-07-09 DIAGNOSIS — R809 Proteinuria, unspecified: Secondary | ICD-10-CM | POA: Diagnosis not present

## 2024-07-09 DIAGNOSIS — N1832 Chronic kidney disease, stage 3b: Secondary | ICD-10-CM | POA: Diagnosis not present

## 2024-07-14 DIAGNOSIS — F5083 Pica in adults: Secondary | ICD-10-CM | POA: Diagnosis not present

## 2024-07-14 DIAGNOSIS — I890 Lymphedema, not elsewhere classified: Secondary | ICD-10-CM | POA: Diagnosis not present

## 2024-07-16 DIAGNOSIS — F5083 Pica in adults: Secondary | ICD-10-CM | POA: Diagnosis not present

## 2024-07-16 DIAGNOSIS — I890 Lymphedema, not elsewhere classified: Secondary | ICD-10-CM | POA: Diagnosis not present

## 2024-07-17 DIAGNOSIS — I890 Lymphedema, not elsewhere classified: Secondary | ICD-10-CM | POA: Diagnosis not present

## 2024-07-17 DIAGNOSIS — F5083 Pica in adults: Secondary | ICD-10-CM | POA: Diagnosis not present

## 2024-07-18 ENCOUNTER — Telehealth (INDEPENDENT_AMBULATORY_CARE_PROVIDER_SITE_OTHER): Admitting: Family Medicine

## 2024-07-18 ENCOUNTER — Other Ambulatory Visit: Payer: Self-pay | Admitting: Family Medicine

## 2024-07-18 ENCOUNTER — Encounter: Payer: Self-pay | Admitting: Family Medicine

## 2024-07-18 ENCOUNTER — Ambulatory Visit: Payer: Self-pay

## 2024-07-18 DIAGNOSIS — R3989 Other symptoms and signs involving the genitourinary system: Secondary | ICD-10-CM

## 2024-07-18 DIAGNOSIS — E1159 Type 2 diabetes mellitus with other circulatory complications: Secondary | ICD-10-CM

## 2024-07-18 MED ORDER — CEPHALEXIN 500 MG PO CAPS: 500.0000 mg | ORAL_CAPSULE | Freq: Three times a day (TID) | ORAL | 0 refills | Status: AC

## 2024-07-18 MED ORDER — FLUCONAZOLE 150 MG PO TABS: 150.0000 mg | ORAL_TABLET | Freq: Once | ORAL | 0 refills | Status: AC

## 2024-07-18 NOTE — Telephone Encounter (Signed)
 FYI Only or Action Required?: Action required by provider: request for appointment.  Patient was last seen in primary care on 07/02/2024 by Jolinda Norene HERO, DO.  Called Nurse Triage reporting Dysuria.  Symptoms began several weeks ago.  Interventions attempted: OTC medications: tylenol .  Symptoms are: unchanged.  Triage Disposition: See Physician Within 24 Hours  Patient/caregiver understands and will follow disposition?: YesCopied from CRM #[id number]. Topic: Clinical - Red Word Triage >> Jul 18, 2024 10:44 AM Emylou G wrote: [facility] that prompted transfer to Nurse Triage: sides, back, and stomach pains - unable to urinate but it hurts ( dribbling ) burns .SABRA Fluid pill isn't working Reason for Disposition  Urinating more frequently than usual (i.e., frequency) OR new-onset of the feeling of an urgent need to urinate (i.e., urgency)  Answer Assessment - Initial Assessment Questions 1. SYMPTOM: What's the main symptom you're concerned about? (e.g., frequency, incontinence)     urgency 2. ONSET: When did the    start?     2 weeks 3. PAIN: Is there any pain? If Yes, ask: How bad is it? (Scale: 1-10; mild, moderate, severe)     Yes, 5-6 4. CAUSE: What do you think is causing the symptoms?     UTI 5. OTHER SYMPTOMS: Do you have any other symptoms? (e.g., blood in urine, fever, flank pain, pain with urination)     Lower back and abd pain  Protocols used: Urinary Symptoms-A-AH

## 2024-07-18 NOTE — Telephone Encounter (Signed)
 Patient is scheduled with DOD today for these symptoms.

## 2024-07-18 NOTE — Progress Notes (Signed)
 MyChart Video visit  Subjective: CC:UTI PCP: Jolinda Norene HERO, DO YEP:[name] is a 63 y.o. female. Patient provides verbal consent for consult held via video.  Due to COVID-19 pandemic this visit was conducted virtually. This visit type was conducted due to national recommendations for restrictions regarding the COVID-19 Pandemic (e.g. social distancing, sheltering in place) in an effort to limit this patient's exposure and mitigate transmission in our community. All issues noted in this document were discussed and addressed.  A physical exam was not performed with this format.   Location of patient: home Location of provider: [facility] Others present for call: spouse  1.  Urinary tract infection Patient reports onset of low back pain with some side pain, urgency and sensation of incomplete bladder emptying about 2 weeks ago.'s regular slowly gotten worse but she did not seek care because she is caring for one of her family members who sadly under hospice care.  She has been pushing oral fluids with water and cranberry juice.  She denies any fevers but reports a little bit of nausea.  No hematuria.  No dysuria.  Medical history significant for diabetes and CKD 3B.  She has nephrology labs to get tomorrow so she is glad to provide a urine specimen if needed tomorrow   ROS: Per HPI  Allergies  Allergen Reactions   Farxiga  [Dapagliflozin ] Other (See Comments)    Recurrent yeast infections   Morphine  Nausea And Vomiting   Duloxetine  Other (See Comments)    Unknown    Metformin  And Related    Trulicity  [Dulaglutide ] Other (See Comments)    GI adverse effects (okay on Rybelsus )   Past Medical History:  Diagnosis Date   Arthritis    CAD (coronary artery disease)    BMS to circumflex 2007 - Dr. Obie   CKD (chronic kidney disease) stage 3, GFR 30-59 ml/min (HCC)    COPD (chronic obstructive pulmonary disease) (HCC)    Depression    Dyspnea    Essential hypertension    Falls  frequently    GERD (gastroesophageal reflux disease)    HA (headache)    Hyperlipidemia    Hyperlipidemia associated with type 2 diabetes mellitus (HCC) 02/03/2011   Left-sided face pain    Morbid obesity (HCC)    Noncompliance    OSA (obstructive sleep apnea)    Uses CPAP   Type 2 diabetes mellitus (HCC)    Urinary incontinence    Urinary incontinence     Current Outpatient Medications:    acetaminophen  (TYLENOL ) 325 MG tablet, Take 975 mg by mouth every 8 (eight) hours as needed for mild pain (pain score 1-3)., Disp: , Rfl:    albuterol  (VENTOLIN  HFA) 108 (90 Base) MCG/ACT inhaler, Inhale 2 puffs into the lungs every 6 (six) hours as needed for wheezing or shortness of breath., Disp: 9 g, Rfl: 1   aspirin  EC 325 MG tablet, Take 325 mg by mouth daily., Disp: , Rfl:    Blood Glucose Monitoring Suppl DEVI, Check BGs 4 times daily. E11.65 May substitute to any manufacturer covered by patient's insurance., Disp: 1 each, Rfl: 0   Continuous Glucose Sensor (FREESTYLE LIBRE 3 PLUS SENSOR) MISC, Apply to back of arm every 15 days to test glucose continuously. DX: E11.65, Disp: 2 each, Rfl: 11   docusate sodium  (COLACE) 100 MG capsule, Take 1 capsule (100 mg total) by mouth 2 (two) times daily as needed for mild constipation., Disp: 100 capsule, Rfl: 0   furosemide  (LASIX ) 40  MG tablet, TAKE 1 TABLET BY MOUTH ONCE DAILY AS NEEDED FOR  EDEMA  OR  FLUID, Disp: 90 tablet, Rfl: 0   gabapentin  (NEURONTIN ) 600 MG tablet, TAKE 1 & 1/2 (ONE & ONE-HALF) TABLETS BY MOUTH THREE TIMES DAILY, Disp: 270 tablet, Rfl: 12   Glucose Blood (BLOOD GLUCOSE TEST STRIPS) STRP, Check BGs 4times daily. E11.65 May substitute to any manufacturer covered by patient's insurance., Disp: 400 strip, Rfl: 4   HYDROcodone -acetaminophen  (NORCO/VICODIN) 5-325 MG tablet, Take 1 tablet by mouth every 8 (eight) hours as needed for severe pain (pain score 7-10)., Disp: 15 tablet, Rfl: 0   Insulin  Glargine (BASAGLAR  KWIKPEN) 100 UNIT/ML,  Inject 45-50 Units into the skin at bedtime. May increase up to 50 units if instructed by your provider., Disp: 45 mL, Rfl: 5   Insulin  Pen Needle 29G X MISC, Use to inject insulin  daily as directed. DX:E11.65; please profile for future needs, Disp: 300 each, Rfl: 5   Lancet Device MISC, Check BGs 4x daily. E11.65 May substitute to any manufacturer covered by patient's insurance., Disp: 1 each, Rfl: 0   Lancets Misc. MISC, Check BGs 4x daily. E11.65 May substitute to any manufacturer covered by patient's insurance., Disp: 400 each, Rfl: 4   methocarbamol  (ROBAXIN ) 500 MG tablet, Take 1 tablet (500 mg total) by mouth every 8 (eight) hours as needed for muscle spasms., Disp: 30 tablet, Rfl: 2   metoprolol  tartrate (LOPRESSOR ) 25 MG tablet, Take 1 tablet (25 mg total) by mouth 2 (two) times daily. KEEP OV., Disp: 180 tablet, Rfl: 0   nitroGLYCERIN  (NITROSTAT ) 0.4 MG SL tablet, Place 1 tablet (0.4 mg total) under the tongue every 5 (five) minutes x 3 doses as needed for chest pain (if no relief after 3rd dose, proceed to ED or call 911)., Disp: 25 tablet, Rfl: 3   omeprazole  (PRILOSEC) 40 MG capsule, Take 1 capsule (40 mg total) by mouth daily. For heartburn, Disp: 90 capsule, Rfl: 3   oxybutynin  (DITROPAN  XL) 5 MG 24 hr tablet, Take 1 tablet (5 mg total) by mouth at bedtime. For bladder, Disp: 90 tablet, Rfl: 3   rosuvastatin  (CRESTOR ) 20 MG tablet, Take 1 tablet (20 mg total) by mouth at bedtime., Disp: 90 tablet, Rfl: 3   Semaglutide  14 MG TABS, Take 14 mg by mouth daily., Disp: , Rfl:   Gen: nontoxic female, NAD   Assessment/ Plan: 63 y.o. female   Suspected UTI - Plan: cephALEXin  (KEFLEX ) 500 MG capsule, fluconazole  (DIFLUCAN ) 150 MG tablet, Urinalysis, Routine w reflex microscopic, Urine Culture  Will give urine tomorrow with her nephrology labs.  Start Keflex  TID, diflucan  sent prn.  Push clear fluids. Red flags discussed.  Follow up prn  Start time: 3:41pm End time: 3:49pm  Total  time spent on patient care (including video visit/ documentation): 10 minutes  Kenslei Hearty CHRISTELLA Fielding, DO [facility] [phone]

## 2024-07-20 DIAGNOSIS — I890 Lymphedema, not elsewhere classified: Secondary | ICD-10-CM | POA: Diagnosis not present

## 2024-07-20 DIAGNOSIS — F5083 Pica in adults: Secondary | ICD-10-CM | POA: Diagnosis not present

## 2024-07-23 ENCOUNTER — Other Ambulatory Visit

## 2024-07-24 ENCOUNTER — Other Ambulatory Visit

## 2024-07-24 ENCOUNTER — Ambulatory Visit: Payer: Self-pay | Admitting: Family Medicine

## 2024-07-24 DIAGNOSIS — D631 Anemia in chronic kidney disease: Secondary | ICD-10-CM | POA: Diagnosis not present

## 2024-07-24 DIAGNOSIS — I890 Lymphedema, not elsewhere classified: Secondary | ICD-10-CM | POA: Diagnosis not present

## 2024-07-24 DIAGNOSIS — R3989 Other symptoms and signs involving the genitourinary system: Secondary | ICD-10-CM | POA: Diagnosis not present

## 2024-07-24 DIAGNOSIS — N189 Chronic kidney disease, unspecified: Secondary | ICD-10-CM | POA: Diagnosis not present

## 2024-07-24 DIAGNOSIS — R809 Proteinuria, unspecified: Secondary | ICD-10-CM | POA: Diagnosis not present

## 2024-07-24 DIAGNOSIS — E211 Secondary hyperparathyroidism, not elsewhere classified: Secondary | ICD-10-CM | POA: Diagnosis not present

## 2024-07-24 DIAGNOSIS — E119 Type 2 diabetes mellitus without complications: Secondary | ICD-10-CM | POA: Diagnosis not present

## 2024-07-24 DIAGNOSIS — F5083 Pica in adults: Secondary | ICD-10-CM | POA: Diagnosis not present

## 2024-07-24 DIAGNOSIS — I10 Essential (primary) hypertension: Secondary | ICD-10-CM | POA: Diagnosis not present

## 2024-07-24 LAB — URINALYSIS, ROUTINE W REFLEX MICROSCOPIC
Bilirubin, UA: NEGATIVE
Glucose, UA: NEGATIVE
Ketones, UA: NEGATIVE
Leukocytes,UA: NEGATIVE
Nitrite, UA: NEGATIVE
Protein,UA: NEGATIVE
RBC, UA: NEGATIVE
Specific Gravity, UA: <=1.005 — AB (ref 1.005–1.030)
Urobilinogen, Ur: 0.2 mg/dL (ref 0.2–1.0)
pH, UA: 6 (ref 5.0–7.5)

## 2024-07-26 LAB — URINE CULTURE

## 2024-07-27 ENCOUNTER — Telehealth: Payer: Self-pay | Admitting: Family Medicine

## 2024-07-27 DIAGNOSIS — I152 Hypertension secondary to endocrine disorders: Secondary | ICD-10-CM | POA: Diagnosis not present

## 2024-07-27 DIAGNOSIS — E1159 Type 2 diabetes mellitus with other circulatory complications: Secondary | ICD-10-CM | POA: Diagnosis not present

## 2024-07-27 DIAGNOSIS — Z6841 Body Mass Index (BMI) 40.0 and over, adult: Secondary | ICD-10-CM | POA: Diagnosis not present

## 2024-07-27 DIAGNOSIS — E1142 Type 2 diabetes mellitus with diabetic polyneuropathy: Secondary | ICD-10-CM | POA: Diagnosis not present

## 2024-07-27 DIAGNOSIS — D472 Monoclonal gammopathy: Secondary | ICD-10-CM | POA: Diagnosis not present

## 2024-07-27 DIAGNOSIS — E785 Hyperlipidemia, unspecified: Secondary | ICD-10-CM | POA: Diagnosis not present

## 2024-07-27 DIAGNOSIS — N1832 Chronic kidney disease, stage 3b: Secondary | ICD-10-CM | POA: Diagnosis not present

## 2024-07-27 DIAGNOSIS — N1831 Chronic kidney disease, stage 3a: Secondary | ICD-10-CM | POA: Diagnosis not present

## 2024-07-27 DIAGNOSIS — M1990 Unspecified osteoarthritis, unspecified site: Secondary | ICD-10-CM | POA: Diagnosis not present

## 2024-07-27 DIAGNOSIS — D696 Thrombocytopenia, unspecified: Secondary | ICD-10-CM | POA: Diagnosis not present

## 2024-07-27 DIAGNOSIS — Z794 Long term (current) use of insulin: Secondary | ICD-10-CM | POA: Diagnosis not present

## 2024-07-27 DIAGNOSIS — E1129 Type 2 diabetes mellitus with other diabetic kidney complication: Secondary | ICD-10-CM | POA: Diagnosis not present

## 2024-07-27 DIAGNOSIS — N2581 Secondary hyperparathyroidism of renal origin: Secondary | ICD-10-CM | POA: Diagnosis not present

## 2024-07-27 DIAGNOSIS — I890 Lymphedema, not elsewhere classified: Secondary | ICD-10-CM | POA: Diagnosis not present

## 2024-07-27 DIAGNOSIS — E1122 Type 2 diabetes mellitus with diabetic chronic kidney disease: Secondary | ICD-10-CM | POA: Diagnosis not present

## 2024-07-27 DIAGNOSIS — E1169 Type 2 diabetes mellitus with other specified complication: Secondary | ICD-10-CM | POA: Diagnosis not present

## 2024-07-27 NOTE — Telephone Encounter (Signed)
 Copied from CRM [id number]. Topic: General - Other >> Jul 27, 2024 12:53 PM Zebedee SAUNDERS wrote: Reason for CRM: Pt returning Rosina JULIANNA Epps, CMA call for lab results which were provided. Pt did not have any questions.

## 2024-07-30 ENCOUNTER — Ambulatory Visit (INDEPENDENT_AMBULATORY_CARE_PROVIDER_SITE_OTHER)

## 2024-07-30 ENCOUNTER — Telehealth: Payer: Self-pay | Admitting: Family Medicine

## 2024-07-30 DIAGNOSIS — Z794 Long term (current) use of insulin: Secondary | ICD-10-CM

## 2024-07-30 DIAGNOSIS — E1159 Type 2 diabetes mellitus with other circulatory complications: Secondary | ICD-10-CM

## 2024-07-30 DIAGNOSIS — I890 Lymphedema, not elsewhere classified: Secondary | ICD-10-CM

## 2024-07-30 DIAGNOSIS — E1169 Type 2 diabetes mellitus with other specified complication: Secondary | ICD-10-CM | POA: Diagnosis not present

## 2024-07-30 DIAGNOSIS — I152 Hypertension secondary to endocrine disorders: Secondary | ICD-10-CM

## 2024-07-30 DIAGNOSIS — Z6841 Body Mass Index (BMI) 40.0 and over, adult: Secondary | ICD-10-CM | POA: Diagnosis not present

## 2024-07-30 DIAGNOSIS — D696 Thrombocytopenia, unspecified: Secondary | ICD-10-CM

## 2024-07-30 DIAGNOSIS — E785 Hyperlipidemia, unspecified: Secondary | ICD-10-CM

## 2024-07-30 DIAGNOSIS — E1142 Type 2 diabetes mellitus with diabetic polyneuropathy: Secondary | ICD-10-CM | POA: Diagnosis not present

## 2024-07-30 DIAGNOSIS — E1122 Type 2 diabetes mellitus with diabetic chronic kidney disease: Secondary | ICD-10-CM | POA: Diagnosis not present

## 2024-07-30 DIAGNOSIS — N1832 Chronic kidney disease, stage 3b: Secondary | ICD-10-CM

## 2024-07-30 NOTE — Telephone Encounter (Unsigned)
 Copied from CRM #[id number]. Topic: Clinical - Prescription Issue >> Jul 30, 2024  4:26 PM Graeme ORN wrote: Reason for CRM: Patient called. States pharmacy did not fill fluid pills. She went to pick up her other medicine and they did not give her the fluid pill. Confirmed correct pharmacy. She wants to know if provider took her off. States she requested with pharmacy twice. Does not know name threw bottle away. Thank you

## 2024-07-31 DIAGNOSIS — N1832 Chronic kidney disease, stage 3b: Secondary | ICD-10-CM | POA: Diagnosis not present

## 2024-07-31 DIAGNOSIS — Z794 Long term (current) use of insulin: Secondary | ICD-10-CM | POA: Diagnosis not present

## 2024-07-31 DIAGNOSIS — I152 Hypertension secondary to endocrine disorders: Secondary | ICD-10-CM | POA: Diagnosis not present

## 2024-07-31 DIAGNOSIS — D696 Thrombocytopenia, unspecified: Secondary | ICD-10-CM | POA: Diagnosis not present

## 2024-07-31 DIAGNOSIS — I890 Lymphedema, not elsewhere classified: Secondary | ICD-10-CM | POA: Diagnosis not present

## 2024-07-31 DIAGNOSIS — E1122 Type 2 diabetes mellitus with diabetic chronic kidney disease: Secondary | ICD-10-CM | POA: Diagnosis not present

## 2024-07-31 DIAGNOSIS — E785 Hyperlipidemia, unspecified: Secondary | ICD-10-CM | POA: Diagnosis not present

## 2024-07-31 DIAGNOSIS — E1142 Type 2 diabetes mellitus with diabetic polyneuropathy: Secondary | ICD-10-CM | POA: Diagnosis not present

## 2024-07-31 DIAGNOSIS — Z6841 Body Mass Index (BMI) 40.0 and over, adult: Secondary | ICD-10-CM | POA: Diagnosis not present

## 2024-07-31 DIAGNOSIS — E1169 Type 2 diabetes mellitus with other specified complication: Secondary | ICD-10-CM | POA: Diagnosis not present

## 2024-07-31 DIAGNOSIS — M1990 Unspecified osteoarthritis, unspecified site: Secondary | ICD-10-CM | POA: Diagnosis not present

## 2024-07-31 DIAGNOSIS — E1159 Type 2 diabetes mellitus with other circulatory complications: Secondary | ICD-10-CM | POA: Diagnosis not present

## 2024-07-31 NOTE — Telephone Encounter (Signed)
 She is on Lasix  and this was refilled 07/18/2024 for #90.  Not sure why they have not filled it but maybe call the pharmacy and see if this was just missed on their end??

## 2024-08-01 NOTE — Telephone Encounter (Signed)
 Patient aware and verbalizes understanding.

## 2024-08-09 DIAGNOSIS — Z6841 Body Mass Index (BMI) 40.0 and over, adult: Secondary | ICD-10-CM | POA: Diagnosis not present

## 2024-08-09 DIAGNOSIS — I890 Lymphedema, not elsewhere classified: Secondary | ICD-10-CM | POA: Diagnosis not present

## 2024-08-09 DIAGNOSIS — D696 Thrombocytopenia, unspecified: Secondary | ICD-10-CM | POA: Diagnosis not present

## 2024-08-09 DIAGNOSIS — E785 Hyperlipidemia, unspecified: Secondary | ICD-10-CM | POA: Diagnosis not present

## 2024-08-09 DIAGNOSIS — E1142 Type 2 diabetes mellitus with diabetic polyneuropathy: Secondary | ICD-10-CM | POA: Diagnosis not present

## 2024-08-09 DIAGNOSIS — M1990 Unspecified osteoarthritis, unspecified site: Secondary | ICD-10-CM | POA: Diagnosis not present

## 2024-08-09 DIAGNOSIS — E1122 Type 2 diabetes mellitus with diabetic chronic kidney disease: Secondary | ICD-10-CM | POA: Diagnosis not present

## 2024-08-09 DIAGNOSIS — N1832 Chronic kidney disease, stage 3b: Secondary | ICD-10-CM | POA: Diagnosis not present

## 2024-08-09 DIAGNOSIS — E1159 Type 2 diabetes mellitus with other circulatory complications: Secondary | ICD-10-CM | POA: Diagnosis not present

## 2024-08-09 DIAGNOSIS — E1169 Type 2 diabetes mellitus with other specified complication: Secondary | ICD-10-CM | POA: Diagnosis not present

## 2024-08-09 DIAGNOSIS — I152 Hypertension secondary to endocrine disorders: Secondary | ICD-10-CM | POA: Diagnosis not present

## 2024-08-09 DIAGNOSIS — Z794 Long term (current) use of insulin: Secondary | ICD-10-CM | POA: Diagnosis not present

## 2024-08-13 ENCOUNTER — Telehealth: Payer: Self-pay

## 2024-08-13 NOTE — Telephone Encounter (Signed)
 Has appt 08/21/24  Renewal for Basaglar  [employer])  No 2026 renewal for Rybelsus , med d/c with Novo Nordisk.  Left message requesting call back.

## 2024-08-14 ENCOUNTER — Other Ambulatory Visit: Payer: Self-pay | Admitting: Nurse Practitioner

## 2024-08-14 DIAGNOSIS — I10 Essential (primary) hypertension: Secondary | ICD-10-CM

## 2024-08-16 DIAGNOSIS — Z6841 Body Mass Index (BMI) 40.0 and over, adult: Secondary | ICD-10-CM | POA: Diagnosis not present

## 2024-08-16 DIAGNOSIS — I890 Lymphedema, not elsewhere classified: Secondary | ICD-10-CM | POA: Diagnosis not present

## 2024-08-16 DIAGNOSIS — N1832 Chronic kidney disease, stage 3b: Secondary | ICD-10-CM | POA: Diagnosis not present

## 2024-08-16 DIAGNOSIS — E1142 Type 2 diabetes mellitus with diabetic polyneuropathy: Secondary | ICD-10-CM | POA: Diagnosis not present

## 2024-08-16 DIAGNOSIS — E1169 Type 2 diabetes mellitus with other specified complication: Secondary | ICD-10-CM | POA: Diagnosis not present

## 2024-08-16 DIAGNOSIS — E1122 Type 2 diabetes mellitus with diabetic chronic kidney disease: Secondary | ICD-10-CM | POA: Diagnosis not present

## 2024-08-16 DIAGNOSIS — E1159 Type 2 diabetes mellitus with other circulatory complications: Secondary | ICD-10-CM | POA: Diagnosis not present

## 2024-08-16 DIAGNOSIS — D696 Thrombocytopenia, unspecified: Secondary | ICD-10-CM | POA: Diagnosis not present

## 2024-08-16 DIAGNOSIS — E785 Hyperlipidemia, unspecified: Secondary | ICD-10-CM | POA: Diagnosis not present

## 2024-08-16 DIAGNOSIS — M1990 Unspecified osteoarthritis, unspecified site: Secondary | ICD-10-CM | POA: Diagnosis not present

## 2024-08-16 DIAGNOSIS — Z794 Long term (current) use of insulin: Secondary | ICD-10-CM | POA: Diagnosis not present

## 2024-08-16 DIAGNOSIS — I152 Hypertension secondary to endocrine disorders: Secondary | ICD-10-CM | POA: Diagnosis not present

## 2024-08-16 NOTE — Progress Notes (Unsigned)
 [facility]   Telephone:([phone]) [phone] Fax:(336) [phone]   Clinic New Consult Note   Patient Care Team: Jolinda Norene HERO, DO as PCP - General (Family Medicine) Debera Jayson MATSU, MD as PCP - Cardiology (Cardiology) Onita Duos, MD as Consulting Physician (Neurology) Heide Ingle, MD as Consulting Physician (Orthopedic Surgery) Debera Jayson MATSU, MD as Consulting Physician (Cardiology) Billee Mliss BIRCH, [facility] (Pharmacist) Bertrum Rosina HERO, RN as [facility] Care Management Carolynn Tillman KATHEE georgann DESIREE Care Management 08/16/2024  CHIEF COMPLAINTS/PURPOSE OF CONSULTATION:  MGUS   REFERRING PHYSICIAN: Rachele Gaynell RAMAN, MD   Discussed the use of AI scribe software for clinical note transcription with the patient, who gave verbal consent to proceed.  History of Present Illness      MEDICAL HISTORY:  Past Medical History:  Diagnosis Date   Arthritis    CAD (coronary artery disease)    BMS to circumflex 2007 - Dr. Obie   CKD (chronic kidney disease) stage 3, GFR 30-59 ml/min (HCC)    COPD (chronic obstructive pulmonary disease) (HCC)    Depression    Dyspnea    Essential hypertension    Falls frequently    GERD (gastroesophageal reflux disease)    HA (headache)    Hyperlipidemia    Hyperlipidemia associated with type 2 diabetes mellitus (HCC) 02/03/2011   Left-sided face pain    Morbid obesity (HCC)    Noncompliance    OSA (obstructive sleep apnea)    Uses CPAP   Type 2 diabetes mellitus (HCC)    Urinary incontinence    Urinary incontinence     SURGICAL HISTORY: Past Surgical History:  Procedure Laterality Date   ABDOMINAL HERNIA REPAIR     ABDOMINAL HYSTERECTOMY     BACK SURGERY     BREAST BIOPSY Right 2013   benign   BREAST SURGERY     biopsy per right breast; pt states has silver piece in for marking    CARPAL TUNNEL RELEASE Left 06/13/2023   Procedure: LEFT CARPAL TUNNEL RELEASE;  Surgeon: Onesimo Oneil LABOR, MD;  Location: [facility];  Service:  Orthopedics;  Laterality: Left;   Cataract surgery Bilateral    CHOLECYSTECTOMY     COLONOSCOPY N/A 04/10/2013   Procedure: COLONOSCOPY;  Surgeon: Claudis RAYMOND Rivet, MD;  Location: [facility] END[facility] SUITE;  Service: Endoscopy;  Laterality: N/A;   CORONARY ANGIOPLASTY WITH STENT PLACEMENT  2012   HERNIA REPAIR     LEFT HEART CATH AND CORONARY ANGIOGRAPHY N/A 06/19/2020   Procedure: LEFT HEART CATH AND CORONARY ANGIOGRAPHY;  Surgeon: Claudene Victory ORN, MD;  Location: [facility] INVASIVE CV LAB;  Service: Cardiovascular;  Laterality: N/A;   LUMBAR LAMINECTOMY/DECOMPRESSION MICRODISCECTOMY N/A 04/25/2015   Procedure: CENTRAL LUMBAR /DECOMPRESSION L4-L5;  Surgeon: Ingle Heide, MD;  Location: [facility];  Service: Orthopedics;  Laterality: N/A;   TUBAL LIGATION      SOCIAL HISTORY: Social History   Socioeconomic History   Marital status: Married    Spouse name: Elsie   Number of children: 5   Years of education: 10 th   Highest education level: 10th grade  Occupational History   Occupation: disabled     Comment: disabled  Tobacco Use   Smoking status: Not on file   Smokeless tobacco: Never   Tobacco comments:    Smoking Cessation Classes, Services, Engineering geologist.  Vaping Use   Vaping status: Never Used  Substance and Sexual Activity   Alcohol  use: Not on file   Drug use: Not on  file   Sexual activity: Not on file  Other Topics Concern   Not on file  Social History Narrative   Patient lives at home with her husband. Patient is disabled.   Children live nearby   Patient has 10 th grade education.   Right handed.   Caffeine- one  cup of coffee and  One soda Dr.Pepper/ tea. daily   Social Drivers of Health   Financial Resource Strain: Low Risk  (05/04/2024)   Overall Financial Resource Strain (CARDIA)    Difficulty of Paying Living Expenses: Not very hard  Food Insecurity: Food Insecurity Present (05/29/2024)   Hunger Vital Sign    Worried About Running Out of Food in the Last Year:  Never true    Ran Out of Food in the Last Year: Sometimes true  Transportation Needs: No Transportation Needs (05/29/2024)   PRAPARE - Administrator, Civil Service (Medical): No    Lack of Transportation (Non-Medical): No  Physical Activity: Inactive (01/10/2024)   Exercise Vital Sign    Days of Exercise per Week: 0 days    Minutes of Exercise per Session: 0 min  Stress: No Stress Concern Present (01/10/2024)   [employer] of Occupational Health - Occupational Stress Questionnaire    Feeling of Stress : Not at all  Social Connections: Moderately Integrated (02/15/2024)   Social Connection and Isolation Panel    Frequency of Communication with Friends and Family: More than three times a week    Frequency of Social Gatherings with Friends and Family: Three times a week    Attends Religious Services: More than 4 times per year    Active Member of Clubs or Organizations: No    Attends Banker Meetings: Never    Marital Status: Married  Catering manager Violence: Not At Risk (05/29/2024)   Humiliation, Afraid, Rape, and Kick questionnaire    Fear of Current or Ex-Partner: No    Emotionally Abused: No    Physically Abused: No    Sexually Abused: No    FAMILY HISTORY: Family History  Problem Relation Age of Onset   Breast cancer Mother 1   Cancer Father    Coronary artery disease Father    Cancer - Colon Father    Diabetes Father    High Cholesterol Father    Arthritis Sister    Asthma Sister    High Cholesterol Daughter    Thyroid  disease Daughter    Hiatal hernia Son    Congenital heart disease Son     ALLERGIES:  is allergic to farxiga  [dapagliflozin ], morphine , duloxetine , metformin  and related, and trulicity  [dulaglutide ].  MEDICATIONS:  Current Outpatient Medications  Medication Sig Dispense Refill   acetaminophen  (TYLENOL ) 325 MG tablet Take 975 mg by mouth every 8 (eight) hours as needed for mild pain (pain score 1-3).     albuterol   (VENTOLIN  HFA) 108 (90 Base) MCG/ACT inhaler Inhale 2 puffs into the lungs every 6 (six) hours as needed for wheezing or shortness of breath. 9 g 1   aspirin  EC 325 MG tablet Take 325 mg by mouth daily.     Blood Glucose Monitoring Suppl DEVI Check BGs 4 times daily. E11.65 May substitute to any manufacturer covered by patient's insurance. 1 each 0   Continuous Glucose Sensor (FREESTYLE LIBRE 3 PLUS SENSOR) MISC Apply to back of arm every 15 days to test glucose continuously. DX: E11.65 2 each 11   docusate sodium  (COLACE) 100 MG capsule Take 1 capsule (100  mg total) by mouth 2 (two) times daily as needed for mild constipation. 100 capsule 0   furosemide  (LASIX ) 40 MG tablet TAKE 1 TABLET BY MOUTH ONCE DAILY AS NEEDED FOR  EDEMA  OR  FLUID 90 tablet 0   gabapentin  (NEURONTIN ) 600 MG tablet TAKE 1 & 1/2 (ONE & ONE-HALF) TABLETS BY MOUTH THREE TIMES DAILY 270 tablet 12   Glucose Blood (BLOOD GLUCOSE TEST STRIPS) STRP Check BGs 4times daily. E11.65 May substitute to any manufacturer covered by patient's insurance. 400 strip 4   HYDROcodone -acetaminophen  (NORCO/VICODIN) 5-325 MG tablet Take 1 tablet by mouth every 8 (eight) hours as needed for severe pain (pain score 7-10). 15 tablet 0   Insulin  Glargine (BASAGLAR  KWIKPEN) 100 UNIT/ML Inject 45-50 Units into the skin at bedtime. May increase up to 50 units if instructed by your provider. 45 mL 5   Insulin  Pen Needle 29G X MISC Use to inject insulin  daily as directed. DX:E11.65; please profile for future needs 300 each 5   Lancet Device MISC Check BGs 4x daily. E11.65 May substitute to any manufacturer covered by patient's insurance. 1 each 0   Lancets Misc. MISC Check BGs 4x daily. E11.65 May substitute to any manufacturer covered by patient's insurance. 400 each 4   methocarbamol  (ROBAXIN ) 500 MG tablet Take 1 tablet (500 mg total) by mouth every 8 (eight) hours as needed for muscle spasms. 30 tablet 2   metoprolol  tartrate (LOPRESSOR ) 25 MG tablet  Take 1 tablet (25 mg total) by mouth 2 (two) times daily. KEEP OV. 180 tablet 0   nitroGLYCERIN  (NITROSTAT ) 0.4 MG SL tablet Place 1 tablet (0.4 mg total) under the tongue every 5 (five) minutes x 3 doses as needed for chest pain (if no relief after 3rd dose, proceed to ED or call 911). 25 tablet 3   omeprazole  (PRILOSEC) 40 MG capsule Take 1 capsule (40 mg total) by mouth daily. For heartburn 90 capsule 3   oxybutynin  (DITROPAN  XL) 5 MG 24 hr tablet Take 1 tablet (5 mg total) by mouth at bedtime. For bladder 90 tablet 3   rosuvastatin  (CRESTOR ) 20 MG tablet Take 1 tablet (20 mg total) by mouth at bedtime. 90 tablet 3   Semaglutide  14 MG TABS Take 14 mg by mouth daily.     No current facility-administered medications for this visit.    REVIEW OF SYSTEMS:   Constitutional: Denies fevers, chills or abnormal night sweats Eyes: Denies blurriness of vision, double vision or watery eyes Ears, nose, mouth, throat, and face: Denies mucositis or sore throat Respiratory: Denies cough, dyspnea or wheezes Cardiovascular: Denies palpitation, chest discomfort or lower extremity swelling Gastrointestinal:  Denies nausea, heartburn or change in bowel habits Skin: Denies abnormal skin rashes Lymphatics: Denies new lymphadenopathy or easy bruising Neurological:Denies numbness, tingling or new weaknesses Behavioral/Psych: Mood is stable, no new changes  All other systems were reviewed with the patient and are negative.  PHYSICAL EXAMINATION: ECOG PERFORMANCE STATUS: {CHL ONC ECOG PS:[phone]}  There were no vitals filed for this visit. There were no vitals filed for this visit.  GENERAL:alert, no distress and comfortable SKIN: skin color, texture, turgor are normal, no rashes or significant lesions EYES: normal, conjunctiva are pink and non-injected, sclera clear OROPHARYNX:no exudate, no erythema and lips, buccal mucosa, and tongue normal  NECK: supple, thyroid  normal size, non-tender, without  nodularity LYMPH:  no palpable lymphadenopathy in the cervical, axillary or inguinal LUNGS: clear to auscultation and percussion with normal breathing effort HEART: regular  rate & rhythm and no murmurs and no lower extremity edema ABDOMEN:abdomen soft, non-tender and normal bowel sounds Musculoskeletal:no cyanosis of digits and no clubbing  PSYCH: alert & oriented x 3 with fluent speech NEURO: no focal motor/sensory deficits  Physical Exam   LABORATORY DATA:  I have reviewed the data as listed    Latest Ref Rng & Units 02/28/2024    2:26 PM 02/16/2024    4:12 AM 02/15/2024   12:18 PM  CBC  WBC 3.4 - 10.8 x10E3/uL 7.7  5.5  7.0   Hemoglobin 11.1 - 15.9 g/dL 86.7  88.9  87.8   Hematocrit 34.0 - 46.6 % 40.3  34.8  37.4   Platelets 150 - 450 x10E3/uL 167  107  111     @cmpl @  RADIOGRAPHIC STUDIES: I have personally reviewed the radiological images as listed and agreed with the findings in the report. No results found.  ASSESSMENT & PLAN:   Assessment and Plan Assessment & Plan      No orders of the defined types were placed in this encounter.   All questions were answered. The patient knows to call the clinic with any problems, questions or concerns. I spent {CHL ONC TIME VISIT - DTPQU:[id number]} counseling the patient face to face. The total time spent in the appointment was {CHL ONC TIME VISIT - DTPQU:[id number]} including review of chart and various tests results, discussions about plan of care and coordination of care plan.     Onita Mattock, MD 08/16/2024 9:53 PM

## 2024-08-17 ENCOUNTER — Inpatient Hospital Stay

## 2024-08-17 ENCOUNTER — Ambulatory Visit (HOSPITAL_COMMUNITY)
Admission: RE | Admit: 2024-08-17 | Discharge: 2024-08-17 | Disposition: A | Discharge status: Home / Self Care | Source: Ambulatory Visit | Attending: Hematology | Admitting: Hematology

## 2024-08-17 ENCOUNTER — Inpatient Hospital Stay: Attending: Hematology | Admitting: Hematology

## 2024-08-17 ENCOUNTER — Encounter: Payer: Self-pay | Admitting: Hematology

## 2024-08-17 VITALS — BP 137/74 | HR 62 | Temp 97.6°F | Resp 18

## 2024-08-17 DIAGNOSIS — Z79899 Other long term (current) drug therapy: Secondary | ICD-10-CM | POA: Insufficient documentation

## 2024-08-17 DIAGNOSIS — E785 Hyperlipidemia, unspecified: Secondary | ICD-10-CM | POA: Insufficient documentation

## 2024-08-17 DIAGNOSIS — G4733 Obstructive sleep apnea (adult) (pediatric): Secondary | ICD-10-CM | POA: Insufficient documentation

## 2024-08-17 DIAGNOSIS — Z955 Presence of coronary angioplasty implant and graft: Secondary | ICD-10-CM | POA: Diagnosis not present

## 2024-08-17 DIAGNOSIS — F17200 Nicotine dependence, unspecified, uncomplicated: Secondary | ICD-10-CM | POA: Insufficient documentation

## 2024-08-17 DIAGNOSIS — I129 Hypertensive chronic kidney disease with stage 1 through stage 4 chronic kidney disease, or unspecified chronic kidney disease: Secondary | ICD-10-CM | POA: Insufficient documentation

## 2024-08-17 DIAGNOSIS — G8929 Other chronic pain: Secondary | ICD-10-CM | POA: Diagnosis not present

## 2024-08-17 DIAGNOSIS — Z801 Family history of malignant neoplasm of trachea, bronchus and lung: Secondary | ICD-10-CM | POA: Diagnosis not present

## 2024-08-17 DIAGNOSIS — E538 Deficiency of other specified B group vitamins: Secondary | ICD-10-CM | POA: Diagnosis not present

## 2024-08-17 DIAGNOSIS — E559 Vitamin D deficiency, unspecified: Secondary | ICD-10-CM | POA: Insufficient documentation

## 2024-08-17 DIAGNOSIS — N183 Chronic kidney disease, stage 3 unspecified: Secondary | ICD-10-CM | POA: Insufficient documentation

## 2024-08-17 DIAGNOSIS — Z794 Long term (current) use of insulin: Secondary | ICD-10-CM | POA: Insufficient documentation

## 2024-08-17 DIAGNOSIS — R32 Unspecified urinary incontinence: Secondary | ICD-10-CM | POA: Diagnosis not present

## 2024-08-17 DIAGNOSIS — D472 Monoclonal gammopathy: Secondary | ICD-10-CM | POA: Insufficient documentation

## 2024-08-17 DIAGNOSIS — Z800 Family history of malignant neoplasm of digestive organs: Secondary | ICD-10-CM | POA: Insufficient documentation

## 2024-08-17 DIAGNOSIS — Z803 Family history of malignant neoplasm of breast: Secondary | ICD-10-CM | POA: Diagnosis not present

## 2024-08-17 DIAGNOSIS — K529 Noninfective gastroenteritis and colitis, unspecified: Secondary | ICD-10-CM | POA: Insufficient documentation

## 2024-08-17 DIAGNOSIS — Z808 Family history of malignant neoplasm of other organs or systems: Secondary | ICD-10-CM | POA: Diagnosis not present

## 2024-08-17 DIAGNOSIS — E1122 Type 2 diabetes mellitus with diabetic chronic kidney disease: Secondary | ICD-10-CM | POA: Insufficient documentation

## 2024-08-17 DIAGNOSIS — M549 Dorsalgia, unspecified: Secondary | ICD-10-CM | POA: Diagnosis not present

## 2024-08-17 DIAGNOSIS — Z7982 Long term (current) use of aspirin: Secondary | ICD-10-CM | POA: Insufficient documentation

## 2024-08-17 DIAGNOSIS — I2510 Atherosclerotic heart disease of native coronary artery without angina pectoris: Secondary | ICD-10-CM | POA: Insufficient documentation

## 2024-08-17 DIAGNOSIS — J449 Chronic obstructive pulmonary disease, unspecified: Secondary | ICD-10-CM | POA: Insufficient documentation

## 2024-08-17 DIAGNOSIS — R779 Abnormality of plasma protein, unspecified: Secondary | ICD-10-CM | POA: Insufficient documentation

## 2024-08-17 LAB — CBC WITH DIFFERENTIAL/PLATELET
Abs Immature Granulocytes: 0.02 K/uL (ref 0.00–0.07)
Basophils Absolute: 0.1 K/uL (ref 0.0–0.1)
Basophils Relative: 1 %
Eosinophils Absolute: 0.2 K/uL (ref 0.0–0.5)
Eosinophils Relative: 3 %
HCT: 37.8 % (ref 36.0–46.0)
Hemoglobin: 12.5 g/dL (ref 12.0–15.0)
Immature Granulocytes: 0 %
Lymphocytes Relative: 39 %
Lymphs Abs: 2.9 K/uL (ref 0.7–4.0)
MCH: 32.4 pg (ref 26.0–34.0)
MCHC: 33.1 g/dL (ref 30.0–36.0)
MCV: 97.9 fL (ref 80.0–100.0)
Monocytes Absolute: 0.5 K/uL (ref 0.1–1.0)
Monocytes Relative: 6 %
Neutro Abs: 3.6 K/uL (ref 1.7–7.7)
Neutrophils Relative %: 51 %
Platelets: 156 K/uL (ref 150–400)
RBC: 3.86 MIL/uL — ABNORMAL LOW (ref 3.87–5.11)
RDW: 17 % — ABNORMAL HIGH (ref 11.5–15.5)
Smear Review: NORMAL
WBC: 7.3 K/uL (ref 4.0–10.5)
nRBC: 0 % (ref 0.0–0.2)

## 2024-08-17 LAB — COMPREHENSIVE METABOLIC PANEL WITH GFR
ALT: 10 U/L (ref 0–44)
AST: 14 U/L — ABNORMAL LOW (ref 15–41)
Albumin: 4 g/dL (ref 3.5–5.0)
Alkaline Phosphatase: 94 U/L (ref 38–126)
Anion gap: 12 (ref 5–15)
BUN: 11 mg/dL (ref 8–23)
CO2: 25 mmol/L (ref 22–32)
Calcium: 9.3 mg/dL (ref 8.9–10.3)
Chloride: 108 mmol/L (ref 98–111)
Creatinine, Ser: 1.15 mg/dL — ABNORMAL HIGH (ref 0.44–1.00)
GFR, Estimated: 53 mL/min — ABNORMAL LOW (ref >60)
Glucose, Bld: 92 mg/dL (ref 70–99)
Potassium: 4.1 mmol/L (ref 3.5–5.1)
Sodium: 146 mmol/L — ABNORMAL HIGH (ref 135–145)
Total Bilirubin: 0.3 mg/dL (ref 0.0–1.2)
Total Protein: 6.6 g/dL (ref 6.5–8.1)

## 2024-08-17 LAB — VITAMIN B12: Vitamin B-12: 545 pg/mL (ref 180–914)

## 2024-08-17 NOTE — Addendum Note (Signed)
 Addended by: LANNY CALLANDER on: 08/17/2024 12:20 PM   Modules accepted: Orders

## 2024-08-19 LAB — INTRINSIC FACTOR ANTIBODIES: Intrinsic Factor: 1 [AU]/ml (ref 0.0–1.1)

## 2024-08-20 ENCOUNTER — Telehealth: Payer: Self-pay | Admitting: Cardiology

## 2024-08-20 DIAGNOSIS — I10 Essential (primary) hypertension: Secondary | ICD-10-CM

## 2024-08-20 LAB — KAPPA/LAMBDA LIGHT CHAINS
Kappa free light chain: 28.7 mg/L — ABNORMAL HIGH (ref 3.3–19.4)
Kappa, lambda light chain ratio: 0.78 (ref 0.26–1.65)
Lambda free light chains: 36.6 mg/L — ABNORMAL HIGH (ref 5.7–26.3)

## 2024-08-20 MED ORDER — METOPROLOL TARTRATE 25 MG PO TABS: 25.0000 mg | ORAL_TABLET | Freq: Two times a day (BID) | ORAL | 2 refills | Status: AC

## 2024-08-20 NOTE — Telephone Encounter (Signed)
 RX sent in

## 2024-08-20 NOTE — Telephone Encounter (Signed)
*  STAT* If patient is at the pharmacy, call can be transferred to refill team.   1. Which medications need to be refilled? (please list name of each medication and dose if known)   metoprolol  tartrate (LOPRESSOR ) 25 MG tablet   2. Would you like to learn more about the convenience, safety, & potential cost savings by using the [facility]?   3. Are you open to using the [facility] (Type [facility]. ).  4. Which pharmacy/location (including street and city if local pharmacy) is medication to be sent to?  [facility] - [place], [place] - [address]   5. Do they need a 30 day or 90 day supply?   90 day  Patient stated she is completely out of this medication.

## 2024-08-21 ENCOUNTER — Encounter: Payer: Self-pay | Admitting: Family Medicine

## 2024-08-21 ENCOUNTER — Ambulatory Visit (INDEPENDENT_AMBULATORY_CARE_PROVIDER_SITE_OTHER): Admitting: Family Medicine

## 2024-08-21 VITALS — BP 115/63 | HR 79 | Temp 97.2°F | Ht 62.0 in | Wt 293.2 lb

## 2024-08-21 DIAGNOSIS — Z13820 Encounter for screening for osteoporosis: Secondary | ICD-10-CM

## 2024-08-21 DIAGNOSIS — Z720 Tobacco use: Secondary | ICD-10-CM

## 2024-08-21 DIAGNOSIS — F4320 Adjustment disorder, unspecified: Secondary | ICD-10-CM | POA: Diagnosis not present

## 2024-08-21 DIAGNOSIS — Z780 Asymptomatic menopausal state: Secondary | ICD-10-CM | POA: Diagnosis not present

## 2024-08-21 DIAGNOSIS — M48062 Spinal stenosis, lumbar region with neurogenic claudication: Secondary | ICD-10-CM

## 2024-08-21 MED ORDER — HYDROCODONE-ACETAMINOPHEN 5-325 MG PO TABS: 1.0000 | ORAL_TABLET | Freq: Three times a day (TID) | ORAL | 0 refills | Status: DC | PRN

## 2024-08-21 NOTE — Progress Notes (Signed)
 Subjective: CC: Checkup PCP: Jolinda Norene HERO, DO YEP:[name] is a 63 y.o. female presenting to clinic today for:  Checkup Patient reports that she she is here for general checkup.  She was apparently called by her office and told that she needed this appointment though at her last visit we recommended follow-up in December for diabetic checkup.  She reports that things are pretty stable.  She has been having some issues with her sister-in-law's boyfriend causing some issues.  She continues to have some grieving for her where she looks for her in the den but she is not there.  She feels well supported by her family and spouse.  She now has the home health provider coming out once weekly to wrap her legs and this is going well.  She should be back on Thursday.  Uses the Norco very sparingly and still has about 10 tablets of the 15 that was prescribed in August.   ROS: Per HPI  Allergies  Allergen Reactions   Farxiga  [Dapagliflozin ] Other (See Comments)    Recurrent yeast infections   Morphine  Nausea And Vomiting   Duloxetine  Other (See Comments)    Unknown    Metformin  And Related    Trulicity  [Dulaglutide ] Other (See Comments)    GI adverse effects (okay on Rybelsus )   Past Medical History:  Diagnosis Date   Arthritis    CAD (coronary artery disease)    BMS to circumflex 2007 - Dr. Obie   CKD (chronic kidney disease) stage 3, GFR 30-59 ml/min (HCC)    COPD (chronic obstructive pulmonary disease) (HCC)    Depression    Dyspnea    Essential hypertension    Falls frequently    GERD (gastroesophageal reflux disease)    HA (headache)    Hyperlipidemia    Hyperlipidemia associated with type 2 diabetes mellitus (HCC) 02/03/2011   Left-sided face pain    Morbid obesity (HCC)    Noncompliance    OSA (obstructive sleep apnea)    Uses CPAP   Type 2 diabetes mellitus (HCC)    Urinary incontinence    Urinary incontinence     Current Outpatient Medications:     acetaminophen  (TYLENOL ) 325 MG tablet, Take 975 mg by mouth every 8 (eight) hours as needed for mild pain (pain score 1-3)., Disp: , Rfl:    albuterol  (VENTOLIN  HFA) 108 (90 Base) MCG/ACT inhaler, Inhale 2 puffs into the lungs every 6 (six) hours as needed for wheezing or shortness of breath., Disp: 9 g, Rfl: 1   aspirin  EC 325 MG tablet, Take 325 mg by mouth daily., Disp: , Rfl:    Blood Glucose Monitoring Suppl DEVI, Check BGs 4 times daily. E11.65 May substitute to any manufacturer covered by patient's insurance., Disp: 1 each, Rfl: 0   Continuous Glucose Sensor (FREESTYLE LIBRE 3 PLUS SENSOR) MISC, Apply to back of arm every 15 days to test glucose continuously. DX: E11.65, Disp: 2 each, Rfl: 11   docusate sodium  (COLACE) 100 MG capsule, Take 1 capsule (100 mg total) by mouth 2 (two) times daily as needed for mild constipation., Disp: 100 capsule, Rfl: 0   furosemide  (LASIX ) 40 MG tablet, TAKE 1 TABLET BY MOUTH ONCE DAILY AS NEEDED FOR  EDEMA  OR  FLUID, Disp: 90 tablet, Rfl: 0   gabapentin  (NEURONTIN ) 600 MG tablet, TAKE 1 & 1/2 (ONE & ONE-HALF) TABLETS BY MOUTH THREE TIMES DAILY, Disp: 270 tablet, Rfl: 12   Glucose Blood (BLOOD GLUCOSE TEST STRIPS)  STRP, Check BGs 4times daily. E11.65 May substitute to any manufacturer covered by patient's insurance., Disp: 400 strip, Rfl: 4   Insulin  Glargine (BASAGLAR  KWIKPEN) 100 UNIT/ML, Inject 45-50 Units into the skin at bedtime. May increase up to 50 units if instructed by your provider., Disp: 45 mL, Rfl: 5   Insulin  Pen Needle 29G X MISC, Use to inject insulin  daily as directed. DX:E11.65; please profile for future needs, Disp: 300 each, Rfl: 5   Lancet Device MISC, Check BGs 4x daily. E11.65 May substitute to any manufacturer covered by patient's insurance., Disp: 1 each, Rfl: 0   Lancets (ONETOUCH DELICA PLUS LANCET30G) MISC, 4 (four) times daily., Disp: , Rfl:    Lancets Misc. MISC, Check BGs 4x daily. E11.65 May substitute to any manufacturer  covered by patient's insurance., Disp: 400 each, Rfl: 4   methocarbamol  (ROBAXIN ) 500 MG tablet, Take 1 tablet (500 mg total) by mouth every 8 (eight) hours as needed for muscle spasms., Disp: 30 tablet, Rfl: 2   metoprolol  tartrate (LOPRESSOR ) 25 MG tablet, Take 1 tablet (25 mg total) by mouth 2 (two) times daily., Disp: 180 tablet, Rfl: 2   nitroGLYCERIN  (NITROSTAT ) 0.4 MG SL tablet, Place 1 tablet (0.4 mg total) under the tongue every 5 (five) minutes x 3 doses as needed for chest pain (if no relief after 3rd dose, proceed to ED or call 911)., Disp: 25 tablet, Rfl: 3   omeprazole  (PRILOSEC) 40 MG capsule, Take 1 capsule (40 mg total) by mouth daily. For heartburn, Disp: 90 capsule, Rfl: 3   oxybutynin  (DITROPAN  XL) 5 MG 24 hr tablet, Take 1 tablet (5 mg total) by mouth at bedtime. For bladder, Disp: 90 tablet, Rfl: 3   rosuvastatin  (CRESTOR ) 20 MG tablet, Take 1 tablet (20 mg total) by mouth at bedtime., Disp: 90 tablet, Rfl: 3   Semaglutide  14 MG TABS, Take 14 mg by mouth daily., Disp: , Rfl:    HYDROcodone -acetaminophen  (NORCO/VICODIN) 5-325 MG tablet, Take 1 tablet by mouth every 8 (eight) hours as needed for severe pain (pain score 7-10)., Disp: 15 tablet, Rfl: 0 Social History   Socioeconomic History   Marital status: Married    Spouse name: Elsie   Number of children: 5   Years of education: 10 th   Highest education level: 10th grade  Occupational History   Occupation: disabled     Comment: disabled  Tobacco Use   Smoking status: Not on file   Smokeless tobacco: Never   Tobacco comments:    Smoking Cessation Classes, Services, Engineering geologist.  Vaping Use   Vaping status: Never Used  Substance and Sexual Activity   Alcohol  use: Not Currently   Drug use: Not on file   Sexual activity: Not on file  Other Topics Concern   Not on file  Social History Narrative   Patient lives at home with her husband. Patient is disabled.   Children live nearby   Patient has  10 th grade education.   Right handed.   Caffeine- one  cup of coffee and  One soda Dr.Pepper/ tea. daily   Social Drivers of Health   Financial Resource Strain: Low Risk  (05/04/2024)   Overall Financial Resource Strain (CARDIA)    Difficulty of Paying Living Expenses: Not very hard  Food Insecurity: No Food Insecurity (08/17/2024)   Hunger Vital Sign    Worried About Running Out of Food in the Last Year: Never true    Ran Out of Food  in the Last Year: Never true  Recent Concern: Food Insecurity - Food Insecurity Present (05/29/2024)   Hunger Vital Sign    Worried About Running Out of Food in the Last Year: Never true    Ran Out of Food in the Last Year: Sometimes true  Transportation Needs: No Transportation Needs (08/17/2024)   PRAPARE - Administrator, Civil Service (Medical): No    Lack of Transportation (Non-Medical): No  Physical Activity: Inactive (01/10/2024)   Exercise Vital Sign    Days of Exercise per Week: 0 days    Minutes of Exercise per Session: 0 min  Stress: No Stress Concern Present (01/10/2024)   [employer] of Occupational Health - Occupational Stress Questionnaire    Feeling of Stress : Not at all  Social Connections: Moderately Integrated (02/15/2024)   Social Connection and Isolation Panel    Frequency of Communication with Friends and Family: More than three times a week    Frequency of Social Gatherings with Friends and Family: Three times a week    Attends Religious Services: More than 4 times per year    Active Member of Clubs or Organizations: No    Attends Banker Meetings: Never    Marital Status: Married  Catering manager Violence: Not At Risk (08/17/2024)   Humiliation, Afraid, Rape, and Kick questionnaire    Fear of Current or Ex-Partner: No    Emotionally Abused: No    Physically Abused: No    Sexually Abused: No   Family History  Problem Relation Age of Onset   Breast cancer Mother 20   Cancer Father    Coronary  artery disease Father    Cancer - Colon Father    Diabetes Father    High Cholesterol Father    Arthritis Sister    Asthma Sister    Cancer Paternal Aunt        lung   High Cholesterol Daughter    Thyroid  disease Daughter    Hiatal hernia Son    Congenital heart disease Son     Objective: Office vital signs reviewed. BP 115/63   Pulse 79   Temp (!) 97.2 F (36.2 C)   Ht 5' 2 (1.575 m)   Wt 293 lb 3.2 oz (133 kg)   SpO2 97%   BMI 53.63 kg/m   Physical Examination:  General: Awake, alert, nontoxic morbidly obese female, No acute distress HEENT: Sclera white.  Moist mucous membranes Cardio: regular rate and rhythm, S1S2 heard, no murmurs appreciated Pulm: clear to auscultation bilaterally, no wheezes, rhonchi or rales; normal work of breathing on room air Extremities: Lymphedema bilateral lower extremities present.  She has some hyperemia    08/21/2024   10:41 AM 08/17/2024   11:25 AM 05/29/2024   10:48 AM  Depression screen PHQ 2/9  Decreased Interest 0 0 0  Down, Depressed, Hopeless 0 0 0  PHQ - 2 Score 0 0 0      03/21/2024    2:19 PM 11/07/2023    2:24 PM 05/24/2023    1:18 PM 03/21/2023   12:11 PM  GAD 7 : Generalized Anxiety Score  Nervous, Anxious, on Edge 2 2 2 2   Control/stop worrying 2 2 3 2   Worry too much - different things 2 2 3 2   Trouble relaxing 1 1 3 3   Restless 0 0 0 0  Easily annoyed or irritable 0 0 1 0  Afraid - awful might happen 1 1 3  2  Total GAD 7 Score 8 8 15 11   Anxiety Difficulty Somewhat difficult Somewhat difficult Somewhat difficult     Assessment/ Plan: 63 y.o. female   Spinal stenosis, lumbar region, with neurogenic claudication - Plan: ToxASSURE Select 13 (MW), Urine, HYDROcodone -acetaminophen  (NORCO/VICODIN) 5-325 MG tablet  Tobacco user - Plan: DG [facility] DEXA  Post-menopausal - Plan: DG [facility] DEXA  Screening for osteoporosis - Plan: DG [facility] DEXA  Grief reaction   Plan for UDS and CSC at next visit.  Norco provided.   The national narcotic database was reviewed with no red flags  DEXA scan ordered.  Continues to grieve but right now does not require any interventions.  She declines influenza vaccination today  Norene CHRISTELLA Fielding, DO [facility] [phone]

## 2024-08-22 LAB — MULTIPLE MYELOMA PANEL, SERUM
Albumin SerPl Elph-Mcnc: 3.4 g/dL (ref 2.9–4.4)
Albumin/Glob SerPl: 1.3 (ref 0.7–1.7)
Alpha 1: 0.2 g/dL (ref 0.0–0.4)
Alpha2 Glob SerPl Elph-Mcnc: 0.8 g/dL (ref 0.4–1.0)
B-Globulin SerPl Elph-Mcnc: 0.8 g/dL (ref 0.7–1.3)
Gamma Glob SerPl Elph-Mcnc: 0.9 g/dL (ref 0.4–1.8)
Globulin, Total: 2.7 g/dL (ref 2.2–3.9)
IgA: 204 mg/dL (ref 87–352)
IgG (Immunoglobin G), Serum: 844 mg/dL (ref 586–1602)
IgM (Immunoglobulin M), Srm: 115 mg/dL (ref 26–217)
Total Protein ELP: 6.1 g/dL (ref 6.0–8.5)

## 2024-08-23 DIAGNOSIS — E785 Hyperlipidemia, unspecified: Secondary | ICD-10-CM | POA: Diagnosis not present

## 2024-08-23 DIAGNOSIS — E1159 Type 2 diabetes mellitus with other circulatory complications: Secondary | ICD-10-CM | POA: Diagnosis not present

## 2024-08-23 DIAGNOSIS — Z6841 Body Mass Index (BMI) 40.0 and over, adult: Secondary | ICD-10-CM | POA: Diagnosis not present

## 2024-08-23 DIAGNOSIS — E1122 Type 2 diabetes mellitus with diabetic chronic kidney disease: Secondary | ICD-10-CM | POA: Diagnosis not present

## 2024-08-23 DIAGNOSIS — I890 Lymphedema, not elsewhere classified: Secondary | ICD-10-CM | POA: Diagnosis not present

## 2024-08-23 DIAGNOSIS — Z794 Long term (current) use of insulin: Secondary | ICD-10-CM | POA: Diagnosis not present

## 2024-08-23 DIAGNOSIS — M1990 Unspecified osteoarthritis, unspecified site: Secondary | ICD-10-CM | POA: Diagnosis not present

## 2024-08-23 DIAGNOSIS — E1169 Type 2 diabetes mellitus with other specified complication: Secondary | ICD-10-CM | POA: Diagnosis not present

## 2024-08-23 DIAGNOSIS — N1832 Chronic kidney disease, stage 3b: Secondary | ICD-10-CM | POA: Diagnosis not present

## 2024-08-23 DIAGNOSIS — E1142 Type 2 diabetes mellitus with diabetic polyneuropathy: Secondary | ICD-10-CM | POA: Diagnosis not present

## 2024-08-23 DIAGNOSIS — D696 Thrombocytopenia, unspecified: Secondary | ICD-10-CM | POA: Diagnosis not present

## 2024-08-23 DIAGNOSIS — I152 Hypertension secondary to endocrine disorders: Secondary | ICD-10-CM | POA: Diagnosis not present

## 2024-08-30 ENCOUNTER — Telehealth: Payer: Self-pay | Admitting: *Deleted

## 2024-08-30 NOTE — Telephone Encounter (Signed)
 VM from [place] nurse w/ [facility] She has been wrapping pt's legs, she does not hae any wounds, so unable to wrap w/ unna boots, will it be ok to do a 2 / 3 layer wrap 2x wk? Is this ok, she is up for discharge next wk Please advise and call Ellouise back

## 2024-08-31 ENCOUNTER — Telehealth: Payer: Self-pay | Admitting: Pharmacist

## 2024-08-31 NOTE — Telephone Encounter (Signed)
 Patient presented to clinic to pick up Rybelsus  shipment from PAP.  She will no longer receive Rybelsus  through [facility]AP due to program discontinuation.  She now has LIS/Extra help so this will assist her with copays etc.  Continue current regimen as patient is controlled.  Bina Veenstra Dattero Chelcea Zahn, PharmD, BCACP, CPP Clinical Pharmacist, [facility]

## 2024-08-31 NOTE — Telephone Encounter (Signed)
Verbal orders given to Tina. °

## 2024-08-31 NOTE — Telephone Encounter (Signed)
 Okay to send me renewal for Basaglar  PAP Patient aware Rybelsus  will be discontinued from PAP Shipment just arrived from PAP

## 2024-08-31 NOTE — Telephone Encounter (Signed)
 Yes this is fine.

## 2024-09-05 ENCOUNTER — Inpatient Hospital Stay (HOSPITAL_BASED_OUTPATIENT_CLINIC_OR_DEPARTMENT_OTHER): Admitting: Oncology

## 2024-09-05 DIAGNOSIS — D472 Monoclonal gammopathy: Secondary | ICD-10-CM

## 2024-09-05 DIAGNOSIS — E1122 Type 2 diabetes mellitus with diabetic chronic kidney disease: Secondary | ICD-10-CM

## 2024-09-05 DIAGNOSIS — E538 Deficiency of other specified B group vitamins: Secondary | ICD-10-CM | POA: Diagnosis not present

## 2024-09-05 DIAGNOSIS — N189 Chronic kidney disease, unspecified: Secondary | ICD-10-CM | POA: Insufficient documentation

## 2024-09-05 DIAGNOSIS — K529 Noninfective gastroenteritis and colitis, unspecified: Secondary | ICD-10-CM

## 2024-09-05 DIAGNOSIS — J449 Chronic obstructive pulmonary disease, unspecified: Secondary | ICD-10-CM

## 2024-09-05 DIAGNOSIS — I129 Hypertensive chronic kidney disease with stage 1 through stage 4 chronic kidney disease, or unspecified chronic kidney disease: Secondary | ICD-10-CM

## 2024-09-05 DIAGNOSIS — N183 Chronic kidney disease, stage 3 unspecified: Secondary | ICD-10-CM

## 2024-09-05 DIAGNOSIS — E559 Vitamin D deficiency, unspecified: Secondary | ICD-10-CM

## 2024-09-05 NOTE — Assessment & Plan Note (Addendum)
-  Found incidentally during CKD workup with Dr. Rachele. -Labs from 08/17/2024 show elevated kappa and lambda free light chains with a normal kappa lambda ratio. -Calcium  normal, renal function improved, hemoglobin normal and no bone pain. -SPEP serum no evidence of M protein.   -Bone survey showed 1.1 cm lucent lesion within the distal right radial metadiaphysis.  This is nonspecific and could represent a benign or malignant lesion.  Dedicated radiographs of the right wrist are recommended for further evaluation.  No additional lytic or sclerotic bone lesions - Will order xray of right wrist. -Follow-up with labs in 6 months

## 2024-09-05 NOTE — Progress Notes (Signed)
 [facility] OFFICE PROGRESS NOTE  [name] M, DO  I connected with [name] on 09/05/24 at  9:30 AM EDT by telephone visit and verified that I am speaking with the correct person using two identifiers.   I discussed the limitations, risks, security and privacy concerns of performing an evaluation and management service by telemedicine and the availability of in-person appointments. I also discussed with the patient that there may be a patient responsible charge related to this service. The patient expressed understanding and agreed to proceed.   Other persons participating in the visit and their role in the encounter: NP, Patient   Patient's location: Home  Provider's location: Clinic   ASSESSMENT & PLAN:  Assessment & Plan MGUS (monoclonal gammopathy of unknown significance) -Found incidentally during CKD workup with Dr. Rachele. -Labs from 08/17/2024 show elevated kappa and lambda free light chains with a normal kappa lambda ratio. -Calcium  normal, renal function improved, hemoglobin normal and no bone pain. -SPEP serum no evidence of M protein.   -Bone survey showed 1.1 cm lucent lesion within the distal right radial metadiaphysis.  This is nonspecific and could represent a benign or malignant lesion.  Dedicated radiographs of the right wrist are recommended for further evaluation.  No additional lytic or sclerotic bone lesions - Will order xray of right wrist. -Follow-up with labs in 6 months Vitamin B12 deficiency disease -Screening labs showed B12 level at 129 on August 8.  2025, she has been oral B12 since then. - Repeat labs from 08/17/2024 show improvement of her B12 level at 545.  Intrinsic factor 1.0 which is normal. -Continue oral B12. Stage 3 chronic kidney disease, unspecified whether stage 3a or 3b CKD (HCC) Stage 3 chronic kidney disease likely secondary to diabetes and hypertension. Recent creatinine level was 1.15.  GFR 53. - Discuss with  kidney doctor about aspirin  dosage - Ensure adequate hydration - Control diabetes and blood pressure   Chronic obstructive pulmonary disease, unspecified COPD type (HCC) COPD managed with emergency inhaler. Smoking cessation is advised to prevent disease progression and potential lung cancer. Family history of lung cancer noted. - Advise smoking cessation - Consider nicotine  patch or Chantix for smoking cessation Chronic diarrhea Chronic diarrhea for over a year, possibly related to past gallbladder surgery. Requires further evaluation by GI specialist. -Referral sent no appointment made at this time. - Consider colonoscopy if kidney function allows Vitamin D  deficiency Vitamin D  deficiency identified. - Continue prescription vitamin D  supplementation  Orders Placed This Encounter  Procedures   DG Wrist Complete Right    Standing Status:   Future    Expiration Date:   09/05/2025    Reason for Exam (SYMPTOM  OR DIAGNOSIS REQUIRED):   see bone survey- possible lieson    Preferred imaging location?:   [facility]   Vitamin B12    Standing Status:   Future    Expected Date:   03/06/2025    Expiration Date:   06/04/2025   CBC with Differential/Platelet    Standing Status:   Future    Expected Date:   03/06/2025    Expiration Date:   06/04/2025   Kappa/lambda light chains    Standing Status:   Future    Expected Date:   03/06/2025    Expiration Date:   06/04/2025   Multiple Myeloma Panel (SPEP&IFE w/QIG)    Standing Status:   Future    Expected Date:   03/06/2025    Expiration  Date:   06/04/2025   CMP (Cancer Center only)    Standing Status:   Future    Expected Date:   03/06/2025    Expiration Date:   06/04/2025   VITAMIN D  25 Hydroxy (Vit-D Deficiency, Fractures)    Standing Status:   Future    Expected Date:   03/06/2025    Expiration Date:   06/04/2025    INTERVAL HISTORY: Patient is a 63 year old female with medical history significant for CKD who was sent to the cancer  center for abnormal protein in the blood.  She has past medical history significant for type 2 diabetes, hypertension, stage III CKD and was found to have low level M protein 0.4 here recently which was negative back in May.  She is here to review results.  She has had diarrhea for over a year occurring 2-3 times daily with urgency after meals.  She is taking colchicine daily for her symptoms.  No infectious workup due to consistency of bowel movements.  She has CKD with stent placement.  She has COPD and uses an emergency inhaler as needed.  She has smoking history but currently smokes very little.  She has urge incontinence and wears a pad due to bladder leakage.  She reports low energy levels despite taking vitamin D  and B12 supplements.  She is taking hydrocodone  for back pain.  Reports right wrist pain that started a few months ago.  Denies injury.  She has family history of breast cancer in her mother and multiple cases of lung throat and colon cancer on her father side.  There is family history of heart disease.  We reviewed SPEP, IFE, kappa lambda light chains, CBC, B12, intrinsic factor, CMP.  SUMMARY OF HEMATOLOGIC HISTORY: Oncology History   No history exists.     CBC    Component Value Date/Time   WBC 7.3 08/17/2024 1206   RBC 3.86 (L) 08/17/2024 1206   HGB 12.5 08/17/2024 1206   HGB 13.2 02/28/2024 1426   HCT 37.8 08/17/2024 1206   HCT 40.3 02/28/2024 1426   PLT 156 08/17/2024 1206   PLT 167 02/28/2024 1426   MCV 97.9 08/17/2024 1206   MCV 96 02/28/2024 1426   MCH 32.4 08/17/2024 1206   MCHC 33.1 08/17/2024 1206   RDW 17.0 (H) 08/17/2024 1206   RDW 15.3 02/28/2024 1426   LYMPHSABS 2.9 08/17/2024 1206   LYMPHSABS 2.2 02/14/2024 1522   MONOABS 0.5 08/17/2024 1206   EOSABS 0.2 08/17/2024 1206   EOSABS 0.1 02/14/2024 1522   BASOSABS 0.1 08/17/2024 1206   BASOSABS 0.1 02/14/2024 1522       Latest Ref Rng & Units 08/17/2024   12:21 PM 07/02/2024    2:03 PM  02/28/2024    2:26 PM  CMP  Glucose 70 - 99 mg/dL 92  83  64   BUN 8 - 23 mg/dL 11  12  11    Creatinine 0.44 - 1.00 mg/dL 8.84  8.48  8.67   Sodium 135 - 145 mmol/L 146  137  141   Potassium 3.5 - 5.1 mmol/L 4.1  3.7  3.6   Chloride 98 - 111 mmol/L 108  99  103   CO2 22 - 32 mmol/L 25  22  25    Calcium  8.9 - 10.3 mg/dL 9.3  9.2  9.5   Total Protein 6.5 - 8.1 g/dL 6.6  6.3    Total Bilirubin 0.0 - 1.2 mg/dL 0.3  0.3    Alkaline Phos  38 - 126 U/L 94  95    AST 15 - 41 U/L 14  15    ALT 0 - 44 U/L 10  12       Lab Results  Component Value Date   FERRITIN 66 05/22/2014   VITAMINB12 545 08/17/2024    There were no vitals filed for this visit.  Review of System:  Review of Systems  Constitutional:  Negative for malaise/fatigue.  Respiratory:  Positive for cough and sputum production.   Musculoskeletal:  Positive for back pain and joint pain (Right wrist pain).  Neurological:  Positive for sensory change. Negative for dizziness.  Psychiatric/Behavioral:  Positive for depression. The patient is nervous/anxious and has insomnia.     Physical Exam: Physical Exam Neurological:     Mental Status: She is alert and oriented to person, place, and time.     I provided 15 minutes of non face-to-face telephone visit time during this encounter, and > 50% was spent counseling as documented under my assessment & plan.   Delon Hope, NP 09/05/2024 9:38 AM

## 2024-09-05 NOTE — Assessment & Plan Note (Addendum)
 Vitamin D  deficiency identified. - Continue prescription vitamin D  supplementation

## 2024-09-05 NOTE — Assessment & Plan Note (Addendum)
 Chronic diarrhea for over a year, possibly related to past gallbladder surgery. Requires further evaluation by GI specialist. -Referral sent no appointment made at this time. - Consider colonoscopy if kidney function allows

## 2024-09-05 NOTE — Assessment & Plan Note (Addendum)
-  Screening labs showed B12 level at 129 on August 8.  2025, she has been oral B12 since then. - Repeat labs from 08/17/2024 show improvement of her B12 level at 545.  Intrinsic factor 1.0 which is normal. -Continue oral B12.

## 2024-09-05 NOTE — Assessment & Plan Note (Addendum)
 Stage 3 chronic kidney disease likely secondary to diabetes and hypertension. Recent creatinine level was 1.15.  GFR 53. - Discuss with kidney doctor about aspirin  dosage - Ensure adequate hydration - Control diabetes and blood pressure

## 2024-09-05 NOTE — Assessment & Plan Note (Addendum)
 COPD managed with emergency inhaler. Smoking cessation is advised to prevent disease progression and potential lung cancer. Family history of lung cancer noted. - Advise smoking cessation - Consider nicotine  patch or Chantix for smoking cessation

## 2024-09-27 ENCOUNTER — Telehealth: Payer: Self-pay | Admitting: Pharmacist

## 2024-09-27 DIAGNOSIS — E119 Type 2 diabetes mellitus without complications: Secondary | ICD-10-CM

## 2024-09-27 DIAGNOSIS — E1165 Type 2 diabetes mellitus with hyperglycemia: Secondary | ICD-10-CM

## 2024-09-27 MED ORDER — BASAGLAR KWIKPEN 100 UNIT/ML ~~LOC~~ SOPN: 45.0000 [IU] | PEN_INJECTOR | Freq: Every day | SUBCUTANEOUS | 5 refills | Status: DC

## 2024-09-27 NOTE — Telephone Encounter (Signed)
   Assisted with re-enrollment into [employer] [facility] for Basaglar .  New orders escribed to [facility].  Patient stable. Continue current regimen.   [name] Dattero Jaquita Bessire, PharmD, BCACP, CPP Clinical Pharmacist, [facility]

## 2024-10-01 ENCOUNTER — Encounter (INDEPENDENT_AMBULATORY_CARE_PROVIDER_SITE_OTHER): Payer: Self-pay | Admitting: *Deleted

## 2024-10-01 NOTE — Telephone Encounter (Signed)
 PAP: re-enrollment application for Basaglar  has been submitted to [employer], via fax  Please send new 90 day RX to [facility] for patients re-enrollment. Thanks!

## 2024-10-02 ENCOUNTER — Ambulatory Visit

## 2024-10-02 DIAGNOSIS — E785 Hyperlipidemia, unspecified: Secondary | ICD-10-CM

## 2024-10-02 DIAGNOSIS — E1159 Type 2 diabetes mellitus with other circulatory complications: Secondary | ICD-10-CM | POA: Diagnosis not present

## 2024-10-02 DIAGNOSIS — I152 Hypertension secondary to endocrine disorders: Secondary | ICD-10-CM

## 2024-10-02 DIAGNOSIS — I890 Lymphedema, not elsewhere classified: Secondary | ICD-10-CM | POA: Diagnosis not present

## 2024-10-02 DIAGNOSIS — E1142 Type 2 diabetes mellitus with diabetic polyneuropathy: Secondary | ICD-10-CM

## 2024-10-02 DIAGNOSIS — Z794 Long term (current) use of insulin: Secondary | ICD-10-CM

## 2024-10-02 DIAGNOSIS — D696 Thrombocytopenia, unspecified: Secondary | ICD-10-CM

## 2024-10-02 DIAGNOSIS — N1832 Chronic kidney disease, stage 3b: Secondary | ICD-10-CM

## 2024-10-02 DIAGNOSIS — E1122 Type 2 diabetes mellitus with diabetic chronic kidney disease: Secondary | ICD-10-CM

## 2024-10-02 DIAGNOSIS — Z6841 Body Mass Index (BMI) 40.0 and over, adult: Secondary | ICD-10-CM

## 2024-10-02 DIAGNOSIS — E1169 Type 2 diabetes mellitus with other specified complication: Secondary | ICD-10-CM

## 2024-10-02 MED ORDER — BASAGLAR KWIKPEN 100 UNIT/ML ~~LOC~~ SOPN: 45.0000 [IU] | PEN_INJECTOR | Freq: Every day | SUBCUTANEOUS | 5 refills | Status: AC

## 2024-10-02 NOTE — Addendum Note (Signed)
 Addended by: Greely Atiyeh D on: 10/02/2024 03:49 PM   Modules accepted: Orders

## 2024-10-02 NOTE — Telephone Encounter (Signed)
 Refills sent

## 2024-10-05 NOTE — Telephone Encounter (Signed)
 PAP: Patient assistance re-enrollment application for Basaglar  has been approved by P[employer] Companies: [employer] from 11/08/24 to 11/07/25.   Medication should be delivered to : Home.   For further shipping updates, please contact [employer] at [phone].

## 2024-10-08 NOTE — Progress Notes (Deleted)
 Chief Complaint: Bladder problems  History of Present Illness:  [name] is a 63 y.o. female who is seen in consultation from Jolinda Norene HERO, DO for evaluation of ***.   Past Medical History:  Past Medical History:  Diagnosis Date   Arthritis    CAD (coronary artery disease)    BMS to circumflex 2007 - Dr. Obie   CKD (chronic kidney disease) stage 3, GFR 30-59 ml/min (HCC)    COPD (chronic obstructive pulmonary disease) (HCC)    Depression    Dyspnea    Essential hypertension    Falls frequently    GERD (gastroesophageal reflux disease)    HA (headache)    Hyperlipidemia    Hyperlipidemia associated with type 2 diabetes mellitus (HCC) 02/03/2011   Left-sided face pain    Morbid obesity (HCC)    Noncompliance    OSA (obstructive sleep apnea)    Uses CPAP   Type 2 diabetes mellitus (HCC)    Urinary incontinence    Urinary incontinence     Past Surgical History:  Past Surgical History:  Procedure Laterality Date   ABDOMINAL HERNIA REPAIR     ABDOMINAL HYSTERECTOMY     BACK SURGERY     BREAST BIOPSY Right 2013   benign   BREAST SURGERY     biopsy per right breast; pt states has silver piece in for marking    CARPAL TUNNEL RELEASE Left 06/13/2023   Procedure: LEFT CARPAL TUNNEL RELEASE;  Surgeon: Onesimo Oneil LABOR, MD;  Location: [facility];  Service: Orthopedics;  Laterality: Left;   Cataract surgery Bilateral    CHOLECYSTECTOMY     COLONOSCOPY N/A 04/10/2013   Procedure: COLONOSCOPY;  Surgeon: Claudis RAYMOND Rivet, MD;  Location: [facility];  Service: Endoscopy;  Laterality: N/A;   CORONARY ANGIOPLASTY WITH STENT PLACEMENT  2012   HERNIA REPAIR     LEFT HEART CATH AND CORONARY ANGIOGRAPHY N/A 06/19/2020   Procedure: LEFT HEART CATH AND CORONARY ANGIOGRAPHY;  Surgeon: Sharps Victory ORN, MD;  Location: [facility];  Service: Cardiovascular;  Laterality: N/A;   LUMBAR LAMINECTOMY/DECOMPRESSION MICRODISCECTOMY N/A 04/25/2015   Procedure: CENTRAL LUMBAR  /DECOMPRESSION L4-L5;  Surgeon: Tanda Heading, MD;  Location: [facility];  Service: Orthopedics;  Laterality: N/A;   TUBAL LIGATION      Allergies:  Allergies  Allergen Reactions   Farxiga  [Dapagliflozin ] Other (See Comments)    Recurrent yeast infections   Morphine  Nausea And Vomiting   Duloxetine  Other (See Comments)    Unknown    Metformin  And Related    Trulicity  [Dulaglutide ] Other (See Comments)    GI adverse effects (okay on Rybelsus )    Family History:  Family History  Problem Relation Age of Onset   Breast cancer Mother 29   Cancer Father    Coronary artery disease Father    Cancer - Colon Father    Diabetes Father    High Cholesterol Father    Arthritis Sister    Asthma Sister    Cancer Paternal Aunt        lung   High Cholesterol Daughter    Thyroid  disease Daughter    Hiatal hernia Son    Congenital heart disease Son     Social History:  Social History   Tobacco Use   Smokeless tobacco: Never   Tobacco comments:    Smoking Cessation Classes, Services, Engineering Geologist.  Vaping Use   Vaping status: Never Used  Substance Use Topics  Alcohol  use: Not Currently    Review of symptoms:  Constitutional:  Negative for unexplained weight loss, night sweats, fever, chills ENT:  Negative for nose bleeds, sinus pain, painful swallowing CV:  Negative for chest pain, shortness of breath, exercise intolerance, palpitations, loss of consciousness Resp:  Negative for cough, wheezing, shortness of breath GI:  Negative for nausea, vomiting, diarrhea, bloody stools GU:  Positives noted in HPI; otherwise negative for gross hematuria, dysuria, urinary incontinence Neuro:  Negative for seizures, poor balance, limb weakness, slurred speech Psych:  Negative for lack of energy, depression, anxiety Endocrine:  Negative for polydipsia, polyuria, symptoms of hypoglycemia (dizziness, hunger, sweating) Hematologic:  Negative for anemia, purpura, petechia, prolonged  or excessive bleeding, use of anticoagulants  Allergic:  Negative for difficulty breathing or choking as a result of exposure to anything; no shellfish allergy; no allergic response (rash/itch) to materials, foods  Physical exam: There were no vitals taken for this visit. GENERAL APPEARANCE:  Well appearing, well developed, well nourished, NAD HEENT: Atraumatic, Normocephalic, oropharynx clear. NECK: Supple without lymphadenopathy or thyromegaly. LUNGS: Clear to auscultation bilaterally. HEART: Regular Rate and Rhythm without murmurs, gallops, or rubs. ABDOMEN: Soft, non-tender, No Masses. EXTREMITIES: Moves all extremities well.  Without clubbing, cyanosis, or edema. NEUROLOGIC:  Alert and oriented x 3, normal gait, CN II-XII grossly intact.  MENTAL STATUS:  Appropriate. BACK:  Non-tender to palpation.  No CVAT SKIN:  Warm, dry and intact.    Results: No results found for this or any previous visit (from the past 24 hours).  I have reviewed prior patient's records  I have reviewed referring/prior physicians records  I have reviewed urinalysis  I have reviewed prior urine cultures  I reviewed prior imaging studies  Assessment: No diagnosis found.   Plan: ***

## 2024-10-09 ENCOUNTER — Ambulatory Visit: Admitting: Urology

## 2024-10-09 DIAGNOSIS — N3281 Overactive bladder: Secondary | ICD-10-CM

## 2024-10-10 ENCOUNTER — Telehealth: Payer: Self-pay | Admitting: Family Medicine

## 2024-10-10 NOTE — Telephone Encounter (Signed)
 Our fax # is [phone]   Copied from CRM [id number]. Topic: Clinical - Order For Equipment >> Oct 10, 2024  1:34 PM Rosaria E wrote: Reason for CRM: Meeka called from [facility] to verify the fax number. They are submitting a request for Wound Care supplies.   Best contact: [phone]

## 2024-10-11 ENCOUNTER — Telehealth: Payer: Self-pay

## 2024-10-11 NOTE — Patient Outreach (Signed)
 Complex Care Management Care Guide Note  10/11/2024 Name: [name] MRN: [id number] DOB: 04/26/1961  [name] is a 63 y.o. year old female who is a primary care patient of Jolinda Norene HERO, DO and is actively engaged with the care management team. I reached out to [name] by phone today to assist with re-scheduling  with the RN Case Manager.  Follow up plan: Telephone appointment with complex care management team member scheduled for:  10/25/24  Shereen Gin [facility]  Population Health VBCI Assistant Direct Dial: [phone]  Fax: [phone] Website: [email or web address]

## 2024-10-16 ENCOUNTER — Telehealth: Payer: Self-pay | Admitting: Family Medicine

## 2024-10-16 NOTE — Telephone Encounter (Signed)
 Signed forms were faxed back today

## 2024-10-16 NOTE — Telephone Encounter (Signed)
 Copied from CRM [id number]. Topic: Clinical - Prescription Issue >> Oct 16, 2024 10:16 AM Cherylann RAMAN wrote: Reason for CRM: Zen with [facility] called to confirm if clinic received a fax for a prescription request for wound care supplies for the patient. Fax was sent on 12/04 and 12/05. Zen can be contacted at [phone]

## 2024-10-25 ENCOUNTER — Telehealth: Payer: Self-pay | Admitting: *Deleted

## 2024-10-25 NOTE — Patient Instructions (Signed)
 Visit Information  Thank you for taking time to visit with me today. Please don't hesitate to contact me if I can be of assistance to you before our next scheduled appointment.  Your next care management appointment is no further scheduled appointments.    Patient has met all care management goals. Care Management case will be closed. Patient has been provided contact information should new needs arise.   Please call the care guide team at [phone] if you need to cancel, schedule, or reschedule an appointment.   Please call the Suicide and Crisis Lifeline: 988 call the [place]  National Suicide Prevention Lifeline: (365)090-2604 or TTY: (979)446-6935 TTY 502-037-9246) to talk to a trained counselor call 1-800-273-TALK (toll free, 24 hour hotline) if you are experiencing a Mental Health or Behavioral Health Crisis or need someone to talk to.  Rosina Forte, BSN RN [facility], [facility] RN Care Manager Direct Dial: [phone]  Fax: [phone]

## 2024-10-25 NOTE — Patient Outreach (Signed)
 Complex Care Management   Visit Note  10/25/2024  Name:  [name] MRN: [id number] DOB: [birth year]  Situation: Referral received for Complex Care Management related to Diabetes with Complications I obtained verbal consent from Patient.  Visit completed with Patient  on the phone  Background:   Past Medical History:  Diagnosis Date   Arthritis    CAD (coronary artery disease)    BMS to circumflex 2007 - Dr. Obie   CKD (chronic kidney disease) stage 3, GFR 30-59 ml/min (HCC)    COPD (chronic obstructive pulmonary disease) (HCC)    Depression    Dyspnea    Essential hypertension    Falls frequently    GERD (gastroesophageal reflux disease)    HA (headache)    Hyperlipidemia    Hyperlipidemia associated with type 2 diabetes mellitus (HCC) 02/03/2011   Left-sided face pain    Morbid obesity (HCC)    Noncompliance    OSA (obstructive sleep apnea)    Uses CPAP   Type 2 diabetes mellitus (HCC)    Urinary incontinence    Urinary incontinence     Assessment: Patient Reported Symptoms:  Cognitive Cognitive Status: No symptoms reported Cognitive/Intellectual Conditions Management [RPT]: None reported or documented in medical history or problem list   Health Maintenance Behaviors: Annual physical exam Healing Pattern: Average Health Facilitated by: Prayer/meditation, Rest, Stress management  Neurological Neurological Review of Symptoms: Numbness, Dizziness Neurological Self-Management Outcome: 3 (uncertain)  HEENT HEENT Symptoms Reported: Tinnitus, Tearing HEENT Management Strategies: Routine screening HEENT Self-Management Outcome: 3 (uncertain) Ear problem(s)  Cardiovascular Cardiovascular Symptoms Reported: Dizziness, Lightheadness Cardiovascular Management Strategies: Routine screening Cardiovascular Comment: Lymphedema swelling  Respiratory Respiratory Symptoms Reported: Shortness of breath Respiratory Management Strategies: Routine screening Respiratory  Self-Management Outcome: 3 (uncertain)  Endocrine Endocrine Symptoms Reported: No symptoms reported Is patient diabetic?: Yes Is patient checking blood sugars at home?: Yes List most recent blood sugar readings, include date and time of day: 10-24-2024 102    Gastrointestinal Gastrointestinal Symptoms Reported: No symptoms reported Gastrointestinal Self-Management Outcome: 4 (good) Nutrition Risk Screen (CP): No indicators present  Genitourinary Genitourinary Symptoms Reported: Difficulty initiating stream Genitourinary Management Strategies: Coping strategies, Incontinence garment/pad Genitourinary Self-Management Outcome: 4 (good)  Integumentary Integumentary Symptoms Reported: Bruising Skin Management Strategies: Dressing changes Skin Self-Management Outcome: 4 (good) Skin Comment: Lymphedema wrapping  Musculoskeletal Musculoskelatal Symptoms Reviewed: Unsteady gait, Weakness, Back pain Musculoskeletal Management Strategies: Medical device, Routine screening Musculoskeletal Self-Management Outcome: 3 (uncertain) Falls in the past year?: No Number of falls in past year: 1 or less Was there an injury with Fall?: No Fall Risk Category Calculator: 0 Patient Fall Risk Level: Low Fall Risk Patient at Risk for Falls Due to: Impaired balance/gait, Impaired mobility Fall risk Follow up: Falls evaluation completed  Psychosocial Psychosocial Symptoms Reported: No symptoms reported Behavioral Management Strategies: Support system Behavioral Health Self-Management Outcome: 4 (good) Major Change/Loss/Stressor/Fears (CP): Denies Quality of Family Relationships: supportive    10/25/2024    PHQ2-9 Depression Screening   Little interest or pleasure in doing things Not at all  Feeling down, depressed, or hopeless Nearly every day  PHQ-2 - Total Score 3  Trouble falling or staying asleep, or sleeping too much Nearly every day  Feeling tired or having little energy Nearly every day  Poor  appetite or overeating  Not at all  Feeling bad about yourself - or that you are a failure or have let yourself or your family down Several days  Trouble concentrating on things, such as reading  the newspaper or watching television Not at all  Moving or speaking so slowly that other people could have noticed.  Or the opposite - being so fidgety or restless that you have been moving around a lot more than usual Not at all  Thoughts that you would be better off dead, or hurting yourself in some way Not at all  PHQ2-9 Total Score 10  If you checked off any problems, how difficult have these problems made it for you to do your work, take care of things at home, or get along with other people Not difficult at all  Depression Interventions/Treatment      There were no vitals filed for this visit. Pain Score: 9  Pain Type: Chronic pain Pain Location: Leg Pain Descriptors / Indicators: Aching Pain Intervention(s): Medication (See eMAR)  Medications Reviewed Today     Reviewed by Bertrum Rosina HERO, RN (Registered Nurse) on 10/25/24 at 1110  Med List Status: <None>   Medication Order Taking? Sig Documenting Provider Last Dose Status Informant  acetaminophen  (TYLENOL ) 325 MG tablet [id number] Yes Take 975 mg by mouth every 8 (eight) hours as needed for mild pain (pain score 1-3). [provider]  Active Self  albuterol  (VENTOLIN  HFA) 108 (90 Base) MCG/ACT inhaler [id number] Yes Inhale 2 puffs into the lungs every 6 (six) hours as needed for wheezing or shortness of breath. Jolinda Norene HERO, DO  Active   aspirin  EC 325 MG tablet [id number] Yes Take 325 mg by mouth daily. [provider]  Active Self  Blood Glucose Monitoring Suppl DEVI [id number] Yes Check BGs 4 times daily. E11.65 May substitute to any manufacturer covered by patient's insurance. Jolinda Norene M, DO  Active   Continuous Glucose Sensor (FREESTYLE LIBRE 3 PLUS SENSOR) MISC [id number]  Apply to back of arm every 15  days to test glucose continuously. DX: E11.65  Patient not taking: Reported on 10/25/2024   Joesph Annabella HERO, FNP  Active   docusate sodium  (COLACE) 100 MG capsule [id number] Yes Take 1 capsule (100 mg total) by mouth 2 (two) times daily as needed for mild constipation. Jolinda Norene M, DO  Active   furosemide  (LASIX ) 40 MG tablet [id number] Yes TAKE 1 TABLET BY MOUTH ONCE DAILY AS NEEDED FOR  EDEMA  OR  FLUID Jolinda Norene M, DO  Active   gabapentin  (NEURONTIN ) 600 MG tablet [id number] Yes TAKE 1 & 1/2 (ONE & ONE-HALF) TABLETS BY MOUTH THREE TIMES DAILY Jolinda Norene M, DO  Active   Glucose Blood (BLOOD GLUCOSE TEST STRIPS) STRP [id number] Yes Check BGs 4times daily. E11.65 May substitute to any manufacturer covered by patient's insurance. Jolinda Norene M, DO  Active   HYDROcodone -acetaminophen  (NORCO/VICODIN) 5-325 MG tablet [id number] Yes Take 1 tablet by mouth every 8 (eight) hours as needed for severe pain (pain score 7-10). Jolinda Norene M, DO  Active   Insulin  Glargine (BASAGLAR  KWIKPEN) 100 UNIT/ML [id number] Yes Inject 45-50 Units into the skin at bedtime. May increase up to 50 units if instructed by your provider. Jolinda Norene M, DO  Active   Insulin  Pen Needle 29G X MISC [id number] Yes Use to inject insulin  daily as directed. DX:E11.65; please profile for future needs Jolinda Norene HERO ROSALEA  Active Self  Lancet Device MISC [id number] Yes Check BGs 4x daily. E11.65 May substitute to any manufacturer covered by patient's insurance. Jolinda Norene M, DO  Active   Lancets [facility] DELICA PLUS [place]) MISC [id number] Yes 4 (four) times daily. [provider]  Active   Lancets Misc. MISC [id number] Yes Check BGs 4x daily. E11.65 May substitute to any manufacturer covered by patient's insurance. Jolinda Potter M, DO  Active   methocarbamol  (ROBAXIN ) 500 MG tablet [id number]  Take 1 tablet (500 mg total) by mouth every 8 (eight) hours as needed for muscle  spasms.  Patient not taking: Reported on 10/25/2024   Jolinda Potter M, DO  Active   metoprolol  tartrate (LOPRESSOR ) 25 MG tablet [id number] Yes Take 1 tablet (25 mg total) by mouth 2 (two) times daily. Debera Jayson MATSU, MD  Active   nitroGLYCERIN  (NITROSTAT ) 0.4 MG SL tablet [id number] Yes Place 1 tablet (0.4 mg total) under the tongue every 5 (five) minutes x 3 doses as needed for chest pain (if no relief after 3rd dose, proceed to ED or call 911). Miriam Norris, NP  Active Self  omeprazole  (PRILOSEC) 40 MG capsule [id number] Yes Take 1 capsule (40 mg total) by mouth daily. For heartburn Jolinda Potter M, DO  Active   oxybutynin  (DITROPAN  XL) 5 MG 24 hr tablet [id number] Yes Take 1 tablet (5 mg total) by mouth at bedtime. For bladder Jolinda Potter M, DO  Active   rosuvastatin  (CRESTOR ) 20 MG tablet [id number] Yes Take 1 tablet (20 mg total) by mouth at bedtime. Jolinda Potter HERO, DO  Active   Semaglutide  14 MG TABS [id number] Yes Take 14 mg by mouth daily. [provider]  Active Self           Med Note TENA, JULIE D   Tue Sep 22, 2021  3:08 PM) VIA [facility] PATIENT ASSISTANCE PROGRAM  Vitamin D , Ergocalciferol , (DRISDOL ) 1.25 MG (50000 UNIT) CAPS capsule [id number] Yes Take 50,000 Units by mouth once a week. [provider]  Active             Recommendation:   Continue Current Plan of Care  Follow Up Plan:   Patient has met all care management goals. Care Management case will be closed. Patient has been provided contact information should new needs arise.   Rosina Forte, BSN RN [facility], [facility] RN Care Manager Direct Dial: [phone]  Fax: [phone]

## 2024-10-31 ENCOUNTER — Ambulatory Visit (INDEPENDENT_AMBULATORY_CARE_PROVIDER_SITE_OTHER)

## 2024-10-31 DIAGNOSIS — Z720 Tobacco use: Secondary | ICD-10-CM | POA: Diagnosis not present

## 2024-10-31 DIAGNOSIS — Z13820 Encounter for screening for osteoporosis: Secondary | ICD-10-CM | POA: Diagnosis not present

## 2024-10-31 DIAGNOSIS — Z780 Asymptomatic menopausal state: Secondary | ICD-10-CM

## 2024-11-02 ENCOUNTER — Ambulatory Visit: Payer: Self-pay | Admitting: Family Medicine

## 2024-11-09 ENCOUNTER — Other Ambulatory Visit: Payer: Self-pay

## 2024-11-09 ENCOUNTER — Ambulatory Visit: Payer: Self-pay | Admitting: Family Medicine

## 2024-11-09 ENCOUNTER — Encounter: Payer: Self-pay | Admitting: Family Medicine

## 2024-11-09 VITALS — BP 91/51 | HR 76 | Temp 97.9°F | Ht 62.0 in | Wt 288.1 lb

## 2024-11-09 DIAGNOSIS — J42 Unspecified chronic bronchitis: Secondary | ICD-10-CM | POA: Diagnosis not present

## 2024-11-09 DIAGNOSIS — E785 Hyperlipidemia, unspecified: Secondary | ICD-10-CM | POA: Diagnosis not present

## 2024-11-09 DIAGNOSIS — Z794 Long term (current) use of insulin: Secondary | ICD-10-CM

## 2024-11-09 DIAGNOSIS — E119 Type 2 diabetes mellitus without complications: Secondary | ICD-10-CM

## 2024-11-09 DIAGNOSIS — I152 Hypertension secondary to endocrine disorders: Secondary | ICD-10-CM | POA: Diagnosis not present

## 2024-11-09 DIAGNOSIS — E1122 Type 2 diabetes mellitus with diabetic chronic kidney disease: Secondary | ICD-10-CM | POA: Diagnosis not present

## 2024-11-09 DIAGNOSIS — N1831 Chronic kidney disease, stage 3a: Secondary | ICD-10-CM

## 2024-11-09 DIAGNOSIS — E1169 Type 2 diabetes mellitus with other specified complication: Secondary | ICD-10-CM

## 2024-11-09 DIAGNOSIS — M48062 Spinal stenosis, lumbar region with neurogenic claudication: Secondary | ICD-10-CM | POA: Diagnosis not present

## 2024-11-09 DIAGNOSIS — E1159 Type 2 diabetes mellitus with other circulatory complications: Secondary | ICD-10-CM

## 2024-11-09 DIAGNOSIS — I890 Lymphedema, not elsewhere classified: Secondary | ICD-10-CM

## 2024-11-09 LAB — BAYER DCA HB A1C WAIVED: HB A1C (BAYER DCA - WAIVED): 6.7 % — ABNORMAL HIGH (ref 4.8–5.6)

## 2024-11-09 MED ORDER — HYDROCODONE-ACETAMINOPHEN 5-325 MG PO TABS: 1.0000 | ORAL_TABLET | Freq: Three times a day (TID) | ORAL | 0 refills | Status: AC | PRN

## 2024-11-09 MED ORDER — ALBUTEROL SULFATE HFA 108 (90 BASE) MCG/ACT IN AERS: 2.0000 | INHALATION_SPRAY | Freq: Four times a day (QID) | RESPIRATORY_TRACT | 1 refills | Status: AC | PRN

## 2024-11-09 NOTE — Progress Notes (Addendum)
 "  Subjective: CC:DM PCP: Jolinda Norene HERO, DO YEP:[name] is a 64 y.o. female presenting to clinic today for:  Type 2 Diabetes with hypertension, hyperlipidemia w/ CKD3a:  Patient is compliant with her insulin , beta-blocker and statin.  She reports no hypoglycemic episodes.  Continues to struggle with lower extremity edema but notes that she gets her legs wrapped every Tuesday and this seems to be helping.  Needs a refill on her albuterol  inhaler so that she has a as needed.  Continues to use Lasix  as needed as directed   Diabetes Health Maintenance Due  Topic Date Due   FOOT EXAM  03/20/2024   HEMOGLOBIN A1C  01/02/2025   OPHTHALMOLOGY EXAM  06/28/2025   With regard to her chronic back pain this has been fairly stable.  She did utilize her Norco prior to arrival today because she was having some exacerbation in her discomfort after the holidays.  She actually tried to cook for her son and was up and around doing that.  She reports no falls, constipation, dizziness etc.  ROS: Per HPI  Allergies[1] Past Medical History:  Diagnosis Date   Arthritis    CAD (coronary artery disease)    BMS to circumflex 2007 - Dr. Obie   CKD (chronic kidney disease) stage 3, GFR 30-59 ml/min (HCC)    COPD (chronic obstructive pulmonary disease) (HCC)    Depression    Dyspnea    Essential hypertension    Falls frequently    GERD (gastroesophageal reflux disease)    HA (headache)    History of acute pyelonephritis 05/30/2024   Hyperlipidemia    Hyperlipidemia associated with type 2 diabetes mellitus (HCC) 02/03/2011   Left-sided face pain    Morbid obesity (HCC)    Noncompliance    OSA (obstructive sleep apnea)    Uses CPAP   Type 2 diabetes mellitus (HCC)    Urinary incontinence    Urinary incontinence    Current Medications[2] Social History   Socioeconomic History   Marital status: Married    Spouse name: Elsie   Number of children: 5   Years of education: 10 th    Highest education level: 10th grade  Occupational History   Occupation: disabled     Comment: disabled  Tobacco Use   Smoking status: Not on file   Smokeless tobacco: Never   Tobacco comments:    Smoking Cessation Classes, Services, Engineering Geologist.  Vaping Use   Vaping status: Never Used  Substance and Sexual Activity   Alcohol  use: Not Currently   Drug use: Not on file   Sexual activity: Not on file  Other Topics Concern   Not on file  Social History Narrative   Patient lives at home with her husband. Patient is disabled.   Children live nearby   Patient has 10 th grade education.   Right handed.   Caffeine- one  cup of coffee and  One soda Dr.Pepper/ tea. daily   Social Drivers of Health   Tobacco Use: Medium Risk (05/29/2024)   Patient History    Smoking Tobacco Use: Never Assessed    Smokeless Tobacco Use: Never    Passive Exposure: Current  Financial Resource Strain: Low Risk (05/04/2024)   Overall Financial Resource Strain (CARDIA)    Difficulty of Paying Living Expenses: Not very hard  Food Insecurity: No Food Insecurity (08/17/2024)   Epic    Worried About Programme Researcher, Broadcasting/film/video in the Last Year: Never true  Ran Out of Food in the Last Year: Never true  Recent Concern: Food Insecurity - Food Insecurity Present (05/29/2024)   Epic    Worried About Programme Researcher, Broadcasting/film/video in the Last Year: Never true    Ran Out of Food in the Last Year: Sometimes true  Transportation Needs: No Transportation Needs (08/17/2024)   Epic    Lack of Transportation (Medical): No    Lack of Transportation (Non-Medical): No  Physical Activity: Inactive (01/10/2024)   Exercise Vital Sign    Days of Exercise per Week: 0 days    Minutes of Exercise per Session: 0 min  Stress: No Stress Concern Present (01/10/2024)   [employer] of Occupational Health - Occupational Stress Questionnaire    Feeling of Stress : Not at all  Social Connections: Moderately Integrated (02/15/2024)    Social Connection and Isolation Panel    Frequency of Communication with Friends and Family: More than three times a week    Frequency of Social Gatherings with Friends and Family: Three times a week    Attends Religious Services: More than 4 times per year    Active Member of Clubs or Organizations: No    Attends Banker Meetings: Never    Marital Status: Married  Catering Manager Violence: Not At Risk (08/17/2024)   Epic    Fear of Current or Ex-Partner: No    Emotionally Abused: No    Physically Abused: No    Sexually Abused: No  Depression (PHQ2-9): Medium Risk (10/25/2024)   Depression (PHQ2-9)    PHQ-2 Score: 10  Alcohol  Screen: Low Risk (01/10/2024)   Alcohol  Screen    Last Alcohol  Screening Score (AUDIT): 0  Housing: Low Risk (08/17/2024)   Epic    Unable to Pay for Housing in the Last Year: No    Number of Times Moved in the Last Year: 0    Homeless in the Last Year: No  Utilities: Not At Risk (08/17/2024)   Epic    Threatened with loss of utilities: No  Recent Concern: Utilities - At Risk (05/29/2024)   Epic    Threatened with loss of utilities: Yes  Health Literacy: Adequate Health Literacy (01/10/2024)   B1300 Health Literacy    Frequency of need for help with medical instructions: Never   Family History  Problem Relation Age of Onset   Breast cancer Mother 75   Cancer Father    Coronary artery disease Father    Cancer - Colon Father    Diabetes Father    High Cholesterol Father    Arthritis Sister    Asthma Sister    Cancer Paternal Aunt        lung   High Cholesterol Daughter    Thyroid  disease Daughter    Hiatal hernia Son    Congenital heart disease Son     Objective: Office vital signs reviewed. BP (!) 91/51   Pulse 76   Temp 97.9 F (36.6 C)   Ht 5' 2 (1.575 m)   Wt 288 lb 2 oz (130.7 kg)   SpO2 96%   BMI 52.70 kg/m   Physical Examination:  General: Awake, alert, morbidly obese, No acute distress HEENT: sclera white,  MMM Cardio: regular rate and rhythm, S1S2 heard, no murmurs appreciated Pulm: clear to auscultation bilaterally, no wheezes, rhonchi or rales; normal work of breathing on room air MSK: Ambulating with some assistance.  Gait antalgic. Neuro: See diabetic foot exam below  Diabetic Foot Exam - Simple  Simple Foot Form Diabetic Foot exam was performed with the following findings: Yes 11/09/2024  4:38 PM  Visual Inspection See comments: Yes Sensation Testing See comments: Yes Pulse Check See comments: Yes Comments +1 pedal pulses.  She has nonpitting edema bilateral feet.  Absent monofilament sensation throughout.  Onychomycotic changes to the nails bilaterally     Lab Results  Component Value Date   HGBA1C 6.3 (H) 07/02/2024    Assessment/ Plan: 64 y.o. female   Insulin -requiring or dependent type II diabetes mellitus (HCC) - Plan: Microalbumin / creatinine urine ratio, Bayer DCA Hb A1c Waived  Hypertension associated with diabetes (HCC)  Hyperlipidemia associated with type 2 diabetes mellitus (HCC)  Stage 3a chronic kidney disease (HCC) - Plan: Ambulatory referral to Home Health  Spinal stenosis, lumbar region, with neurogenic claudication - Plan: ToxASSURE Select 13 (MW), Urine, HYDROcodone -acetaminophen  (NORCO/VICODIN) 5-325 MG tablet, Ambulatory referral to Home Health  Chronic bronchitis, unspecified chronic bronchitis type (HCC) - Plan: albuterol  (VENTOLIN  HFA) 108 (90 Base) MCG/ACT inhaler  Lymphedema - Plan: Ambulatory referral to Home Health  A1c demonstrates controlled diabetes.  Check urine microalbumin.    Blood pressure borderline low but I suspect this is related to use of Norco prior to arrival.  Caution with use of this medicine.  Continue all medications as prescribed for blood pressure, cholesterol and CKD  Update UDS and CSA for ongoing treatment with as needed Norco.  Continues to use the medication sparingly.  National narcotic database reviewed with no  red flags  Lung exam unremarkable today.  Continue albuterol  as needed  Referral for home therapeutic leg wraps placed. If cannot extend may need to go to local wound care for assistance.  Norene CHRISTELLA Fielding, DO [facility] [phone]     [1]  Allergies Allergen Reactions   Farxiga  [Dapagliflozin ] Other (See Comments)    Recurrent yeast infections   Morphine  Nausea And Vomiting   Duloxetine  Other (See Comments)    Unknown    Metformin  And Related    Trulicity  [Dulaglutide ] Other (See Comments)    GI adverse effects (okay on Rybelsus )  [2]  Current Outpatient Medications:    acetaminophen  (TYLENOL ) 325 MG tablet, Take 975 mg by mouth every 8 (eight) hours as needed for mild pain (pain score 1-3)., Disp: , Rfl:    albuterol  (VENTOLIN  HFA) 108 (90 Base) MCG/ACT inhaler, Inhale 2 puffs into the lungs every 6 (six) hours as needed for wheezing or shortness of breath., Disp: 9 g, Rfl: 1   aspirin  EC 325 MG tablet, Take 325 mg by mouth daily., Disp: , Rfl:    Blood Glucose Monitoring Suppl DEVI, Check BGs 4 times daily. E11.65 May substitute to any manufacturer covered by patient's insurance., Disp: 1 each, Rfl: 0   Continuous Glucose Sensor (FREESTYLE LIBRE 3 PLUS SENSOR) MISC, Apply to back of arm every 15 days to test glucose continuously. DX: E11.65 (Patient not taking: Reported on 10/25/2024), Disp: 2 each, Rfl: 11   docusate sodium  (COLACE) 100 MG capsule, Take 1 capsule (100 mg total) by mouth 2 (two) times daily as needed for mild constipation., Disp: 100 capsule, Rfl: 0   furosemide  (LASIX ) 40 MG tablet, TAKE 1 TABLET BY MOUTH ONCE DAILY AS NEEDED FOR  EDEMA  OR  FLUID, Disp: 90 tablet, Rfl: 0   gabapentin  (NEURONTIN ) 600 MG tablet, TAKE 1 & 1/2 (ONE & ONE-HALF) TABLETS BY MOUTH THREE TIMES DAILY, Disp: 270 tablet, Rfl: 12   Glucose Blood (BLOOD GLUCOSE  TEST STRIPS) STRP, Check BGs 4times daily. E11.65 May substitute to any manufacturer covered by  patient's insurance., Disp: 400 strip, Rfl: 4   HYDROcodone -acetaminophen  (NORCO/VICODIN) 5-325 MG tablet, Take 1 tablet by mouth every 8 (eight) hours as needed for severe pain (pain score 7-10)., Disp: 15 tablet, Rfl: 0   Insulin  Glargine (BASAGLAR  KWIKPEN) 100 UNIT/ML, Inject 45-50 Units into the skin at bedtime. May increase up to 50 units if instructed by your provider., Disp: 45 mL, Rfl: 5   Insulin  Pen Needle 29G X MISC, Use to inject insulin  daily as directed. DX:E11.65; please profile for future needs, Disp: 300 each, Rfl: 5   Lancet Device MISC, Check BGs 4x daily. E11.65 May substitute to any manufacturer covered by patient's insurance., Disp: 1 each, Rfl: 0   Lancets (ONETOUCH DELICA PLUS LANCET30G) MISC, 4 (four) times daily., Disp: , Rfl:    Lancets Misc. MISC, Check BGs 4x daily. E11.65 May substitute to any manufacturer covered by patient's insurance., Disp: 400 each, Rfl: 4   methocarbamol  (ROBAXIN ) 500 MG tablet, Take 1 tablet (500 mg total) by mouth every 8 (eight) hours as needed for muscle spasms. (Patient not taking: Reported on 10/25/2024), Disp: 30 tablet, Rfl: 2   metoprolol  tartrate (LOPRESSOR ) 25 MG tablet, Take 1 tablet (25 mg total) by mouth 2 (two) times daily., Disp: 180 tablet, Rfl: 2   nitroGLYCERIN  (NITROSTAT ) 0.4 MG SL tablet, Place 1 tablet (0.4 mg total) under the tongue every 5 (five) minutes x 3 doses as needed for chest pain (if no relief after 3rd dose, proceed to ED or call 911)., Disp: 25 tablet, Rfl: 3   omeprazole  (PRILOSEC) 40 MG capsule, Take 1 capsule (40 mg total) by mouth daily. For heartburn, Disp: 90 capsule, Rfl: 3   oxybutynin  (DITROPAN  XL) 5 MG 24 hr tablet, Take 1 tablet (5 mg total) by mouth at bedtime. For bladder, Disp: 90 tablet, Rfl: 3   rosuvastatin  (CRESTOR ) 20 MG tablet, Take 1 tablet (20 mg total) by mouth at bedtime., Disp: 90 tablet, Rfl: 3   Semaglutide  14 MG TABS, Take 14 mg by mouth daily., Disp: , Rfl:    Vitamin D ,  Ergocalciferol , (DRISDOL ) 1.25 MG (50000 UNIT) CAPS capsule, Take 50,000 Units by mouth once a week., Disp: , Rfl:   "

## 2024-11-10 LAB — MICROALBUMIN / CREATININE URINE RATIO
Creatinine, Urine: 70 mg/dL
Microalb/Creat Ratio: 32 mg/g{creat} — ABNORMAL HIGH (ref 0–29)
Microalbumin, Urine: 22.2 ug/mL

## 2024-11-11 ENCOUNTER — Other Ambulatory Visit: Payer: Self-pay | Admitting: Family Medicine

## 2024-11-11 ENCOUNTER — Other Ambulatory Visit: Payer: Self-pay | Admitting: Nurse Practitioner

## 2024-11-11 DIAGNOSIS — I2510 Atherosclerotic heart disease of native coronary artery without angina pectoris: Secondary | ICD-10-CM

## 2024-11-11 DIAGNOSIS — E1159 Type 2 diabetes mellitus with other circulatory complications: Secondary | ICD-10-CM

## 2024-11-11 DIAGNOSIS — I152 Hypertension secondary to endocrine disorders: Secondary | ICD-10-CM

## 2024-11-12 LAB — TOXASSURE SELECT 13 (MW), URINE

## 2024-11-19 ENCOUNTER — Telehealth: Payer: Self-pay | Admitting: Family Medicine

## 2024-11-19 NOTE — Telephone Encounter (Signed)
 Copied from CRM [id number]. Topic: General - Other >> Nov 19, 2024 10:28 AM Susanna ORN wrote: Reason for CRM: Patient states that she has a nurse that comes to her house to wrap her legs. Patient states that the nurse came today but she told her that her insurance no longer pays them to wrap her legs. Patient wants to let Dr. Jolinda know this & also wants to see if Dr. Jolinda wants her to come in the office to now have this done? States that she needs her legs wrapped. Patient also stated that she was suppose to have her second shingles shot but they forgot to give it to her, therefore, she wants to know if she can come in & have that done at the same time that she gets her legs wrapped? Please give patient a call back to advise. CB #: [phone].

## 2024-11-19 NOTE — Telephone Encounter (Signed)
 Okay to offer her an appointment for both leg wrap and second shingles vaccination.  However, uncertain if we can accommodate leg wraps twice weekly or even once weekly here in the office unless it is with a different provider, as my schedule will not accommodate this.  I assume that insurance not paying for leg wraps is due to physical therapy funds being exhausted but I suspect we can reorder them at some point in time.

## 2024-11-20 NOTE — Telephone Encounter (Signed)
 Pt informed. Apt made with dettinger. LS

## 2024-11-23 ENCOUNTER — Encounter: Payer: Self-pay | Admitting: Family Medicine

## 2024-11-23 ENCOUNTER — Ambulatory Visit (INDEPENDENT_AMBULATORY_CARE_PROVIDER_SITE_OTHER): Admitting: Family Medicine

## 2024-11-23 VITALS — BP 131/69 | Temp 97.2°F | Ht 62.0 in | Wt 274.0 lb

## 2024-11-23 DIAGNOSIS — R600 Localized edema: Secondary | ICD-10-CM

## 2024-11-23 DIAGNOSIS — I890 Lymphedema, not elsewhere classified: Secondary | ICD-10-CM

## 2024-11-23 NOTE — Progress Notes (Signed)
 "  BP 131/69   Temp (!) 97.2 F (36.2 C)   Ht 5' 2 (1.575 m)   Wt 274 lb (124.3 kg)   SpO2 99%   BMI 50.12 kg/m    Subjective:   Patient ID: [name], female    DOB: Feb 28, 1961, 64 y.o.   MRN: [id number]  HPI: [name] is a 64 y.o. female presenting on 11/23/2024 for Edema (BLE)   Discussed the use of AI scribe software for clinical note transcription with the patient, who gave verbal consent to proceed.  History of Present Illness   [name] is a 64 year old female who presents with leg swelling requiring regular wrapping. She is accompanied by her partner of 48 years.  Lower extremity edema - Ongoing leg swelling managed with regular wrapping. - Legs have not been wrapped since last week due to discontinuation of service. - Swelling worsens when legs are not wrapped. - Legs appear improved compared to prior condition, attributed to wrapping.  Tobacco use - She and her partner both smoke.          Relevant past medical, surgical, family and social history reviewed and updated as indicated. Interim medical history since our last visit reviewed. Allergies and medications reviewed and updated.  Review of Systems  Constitutional:  Negative for chills and fever.  Eyes:  Negative for visual disturbance.  Respiratory:  Negative for chest tightness and shortness of breath.   Cardiovascular:  Positive for leg swelling. Negative for chest pain.  Genitourinary:  Negative for difficulty urinating and dysuria.  Musculoskeletal:  Negative for back pain and gait problem.  Skin:  Negative for color change, rash and wound.  Neurological:  Negative for light-headedness and headaches.  Psychiatric/Behavioral:  Negative for agitation and behavioral problems.   All other systems reviewed and are negative.   Per HPI unless specifically indicated above   Allergies as of 11/23/2024       Reactions   Farxiga  [dapagliflozin ] Other (See Comments)   Recurrent yeast  infections   Morphine  Nausea And Vomiting   Duloxetine  Other (See Comments)   Unknown    Metformin  And Related    Trulicity  [dulaglutide ] Other (See Comments)   GI adverse effects (okay on Rybelsus )        Medication List        Accurate as of November 23, 2024  3:43 PM. If you have any questions, ask your nurse or doctor.          acetaminophen  325 MG tablet Commonly known as: TYLENOL  Take 975 mg by mouth every 8 (eight) hours as needed for mild pain (pain score 1-3).   albuterol  108 (90 Base) MCG/ACT inhaler Commonly known as: VENTOLIN  HFA Inhale 2 puffs into the lungs every 6 (six) hours as needed for wheezing or shortness of breath.   aspirin  EC 325 MG tablet Take 325 mg by mouth daily.   Basaglar  KwikPen 100 UNIT/ML Inject 45-50 Units into the skin at bedtime. May increase up to 50 units if instructed by your provider.   Blood Glucose Monitoring Suppl Devi Check BGs 4 times daily. E11.65 May substitute to any manufacturer covered by patient's insurance.   BLOOD GLUCOSE TEST STRIPS Strp Check BGs 4times daily. E11.65 May substitute to any manufacturer covered by patient's insurance.   docusate sodium  100 MG capsule Commonly known as: Colace Take 1 capsule (100 mg total) by mouth 2 (two) times daily as needed for mild constipation.   FreeStyle  Libre 3 Plus Sensor Misc Apply to back of arm every 15 days to test glucose continuously. DX: E11.65   furosemide  40 MG tablet Commonly known as: LASIX  TAKE 1 TABLET BY MOUTH ONCE DAILY AS NEEDED FOR EDEMA OR FLUID   gabapentin  600 MG tablet Commonly known as: NEURONTIN  TAKE 1 & 1/2 (ONE & ONE-HALF) TABLETS BY MOUTH THREE TIMES DAILY   HYDROcodone -acetaminophen  5-325 MG tablet Commonly known as: NORCO/VICODIN Take 1 tablet by mouth every 8 (eight) hours as needed for severe pain (pain score 7-10).   Insulin  Pen Needle 29G X Misc Use to inject insulin  daily as directed. DX:E11.65; please profile for future  needs   Lancet Device Misc Check BGs 4x daily. E11.65 May substitute to any manufacturer covered by patient's insurance.   Lancets Misc. Misc Check BGs 4x daily. E11.65 May substitute to any manufacturer covered by patient's insurance.   methocarbamol  500 MG tablet Commonly known as: ROBAXIN  Take 1 tablet (500 mg total) by mouth every 8 (eight) hours as needed for muscle spasms.   metoprolol  tartrate 25 MG tablet Commonly known as: LOPRESSOR  Take 1 tablet (25 mg total) by mouth 2 (two) times daily.   nitroGLYCERIN  0.4 MG SL tablet Commonly known as: NITROSTAT  DISSOLVE ONE TABLET UNDER THE TONGUE EVERY 5 MINUTES AS NEEDED FOR CHEST PAIN.  DO NOT EXCEED A TOTAL OF 3 DOSES IN 15 MINUTES   omeprazole  40 MG capsule Commonly known as: PRILOSEC Take 1 capsule (40 mg total) by mouth daily. For heartburn   OneTouch Delica Plus Lancet30G Misc 4 (four) times daily.   oxybutynin  5 MG 24 hr tablet Commonly known as: Ditropan  XL Take 1 tablet (5 mg total) by mouth at bedtime. For bladder   rosuvastatin  20 MG tablet Commonly known as: CRESTOR  Take 1 tablet (20 mg total) by mouth at bedtime.   Semaglutide  14 MG Tabs Take 14 mg by mouth daily.   Vitamin D  (Ergocalciferol ) 1.25 MG (50000 UNIT) Caps capsule Commonly known as: DRISDOL  Take 50,000 Units by mouth once a week.         Objective:   BP 131/69   Temp (!) 97.2 F (36.2 C)   Ht 5' 2 (1.575 m)   Wt 274 lb (124.3 kg)   SpO2 99%   BMI 50.12 kg/m   Wt Readings from Last 3 Encounters:  11/23/24 274 lb (124.3 kg)  11/09/24 288 lb 2 oz (130.7 kg)  08/21/24 293 lb 3.2 oz (133 kg)    Physical Exam Vitals and nursing note reviewed.    Physical Exam   EXTREMITIES: Lower extremities with swelling, require regular wrapping. SKIN: Skin not weeping, no signs of infection.     4+ edema bilateral lower extremities    Assessment & Plan:   Problem List Items Addressed This Visit       Other   Peripheral edema -  Primary   Relevant Orders   Ambulatory referral to Home Health   Other Visit Diagnoses       Lymphedema             Chronic lower extremity edema Swelling improved with regular wrapping. No infection or weeping noted. - Arranged Home Health for regular wrapping of lower extremities.          Follow up plan: Return in about 1 week (around 11/30/2024), or if symptoms worsen or fail to improve.  Counseling provided for all of the vaccine components Orders Placed This Encounter  Procedures   Ambulatory  referral to Home Health    Fonda Levins, MD [facility] 11/23/2024, 3:43 PM     "

## 2024-11-28 ENCOUNTER — Ambulatory Visit: Admitting: Family Medicine

## 2024-12-04 ENCOUNTER — Telehealth: Payer: Self-pay | Admitting: Family Medicine

## 2024-12-04 NOTE — Telephone Encounter (Unsigned)
 Copied from CRM #[id number]. Topic: Clinical - Request for Lab/Test Order >> Dec 04, 2024  2:12 PM [name] wrote: Reason for CRM: Patient was scheduled to be seen tomorrow but unable to make it due to the weather. Is asking to see if an order can be placed to have a nurse come out to her house to wrap her legs so she doesn't need to wait until march.  Patient can be reached at [phone]

## 2024-12-05 ENCOUNTER — Ambulatory Visit: Admitting: Family Medicine

## 2024-12-05 ENCOUNTER — Other Ambulatory Visit: Payer: Self-pay | Admitting: Family Medicine

## 2024-12-05 NOTE — Telephone Encounter (Signed)
 Referral placed on her 11/10/2023 encounter for home health leg wraps.  If cannot get extension on these at home she will have to consider going locally to wound care center to have these done going forward

## 2024-12-05 NOTE — Addendum Note (Signed)
 Addended by: JOLINDA NORENE HERO on: 12/05/2024 07:29 AM   Modules accepted: Orders

## 2024-12-06 NOTE — Telephone Encounter (Signed)
 Please inform patient that her insurance will not approve additional home leg wraps.  If she is amenable, I am glad to place referral so that she can start having these done at the wound care center

## 2024-12-06 NOTE — Telephone Encounter (Signed)
 Called [place] nurse w/ [facility], they are no longer seeing pt since the end of Dec / Beginning of Jan, Ins denied any further [facility].

## 2024-12-06 NOTE — Telephone Encounter (Signed)
 Pt reports she does not have transportion out of town. Declined at this time. LS

## 2024-12-14 ENCOUNTER — Ambulatory Visit: Payer: Self-pay

## 2024-12-14 NOTE — Telephone Encounter (Signed)
 FYI Only or Action Required?: FYI only for provider: appointment scheduled on 12/17/24.  Patient was last seen in primary care on 11/23/2024 by [name], Stacey LABOR, [name].  Called Nurse Triage reporting Leg Pain.  Symptoms began several weeks ago.  Interventions attempted: OTC medications: tylenol  and Prescription medications: norco.  Symptoms are: gradually worsening.  Triage Disposition: See PCP When Office is Open (Within 3 Days)  Patient/caregiver understands and will follow disposition?: Yes     Message from [name] sent at 12/14/2024 11:44 AM EST  Reason for Triage: PT Legs wrapped isue in severe pain/ 9 /10 pain level/Hard to sleep     Reason for Disposition  [1] MODERATE pain (e.g., interferes with normal activities, limping) AND [2] present > 3 days  Answer Assessment - Initial Assessment Questions Pt called to schedule appt for lower legs d/t continued pain with inability to wrap lower legs. Pt has been seen by PCP and in clinic for issue, last appt with Dr. Maryanne for wrapping 11/23/24. HH has been discontinued at this time and insurance will no approve further visits per chart. Pt states pain is worse at night so she waits to take Norco until bed so she can sleep. Pt reports she does not elevate legs, she sleeps at her kitchen table with legs dangling. Discussed importance of elevation especially since pt reported legs always feel damp but denied weeping fluid. Pt states d/t knees and lower back it is difficult for her to elevate legs. Discussed support via pillow to lower back and knees and half way elevation in recliner. Discussed naturally with gravity, fluid will pool to legs and any elevation is better than none. Pt voiced understanding. Pt is not doing any compression at this time to legs. Pt take tylenol  PRN throughout the day. Questioned about BP and HR but pt states she has not been monitoring. Appointment scheduled for evaluation. Patient agrees with plan of care, and  will call back if anything changes, or if symptoms worsen.      1. ONSET: When did the pain start?      Ongoing; pt reports legs have not been wrapped since appt 11/23/24 with Dr. Maryanne   2. LOCATION: Where is the pain located?      Bilateral lower legs   3. PAIN: How bad is the pain?    (Scale 1-10; or mild, moderate, severe)     9/10   4. WORK OR EXERCISE: Has there been any recent work or exercise that involved this part of the body?      No   5. CAUSE: What do you think is causing the leg pain?     Pt has continued lower leg swelling; continued issue, not new   6. OTHER SYMPTOMS: Do you have any other symptoms? (e.g., chest pain, back pain, breathing difficulty, swelling, rash, fever, numbness, weakness)     Legs feel damp but denies any weeping of legs  Protocols used: Leg Pain-A-AH

## 2024-12-14 NOTE — Telephone Encounter (Signed)
 Appt made

## 2024-12-17 ENCOUNTER — Ambulatory Visit: Admitting: Family

## 2025-01-10 ENCOUNTER — Ambulatory Visit: Payer: Self-pay

## 2025-01-21 ENCOUNTER — Ambulatory Visit: Admitting: Family Medicine

## 2025-02-27 ENCOUNTER — Inpatient Hospital Stay

## 2025-03-06 ENCOUNTER — Inpatient Hospital Stay: Admitting: Oncology

## 2025-03-20 ENCOUNTER — Ambulatory Visit: Admitting: Family Medicine
